# Patient Record
Sex: Female | Born: 1965 | Race: White | Hispanic: No | Marital: Single | Smoking: Former smoker
Health system: Southern US, Community
[De-identification: ages and names within clinical notes are randomized; demographics above are authoritative.]

## PROBLEM LIST (undated history)

## (undated) ENCOUNTER — Emergency Department (HOSPITAL_COMMUNITY): Admission: EM | Payer: Self-pay | Source: Home / Self Care

## (undated) DIAGNOSIS — I10 Essential (primary) hypertension: Secondary | ICD-10-CM

## (undated) DIAGNOSIS — Z9889 Other specified postprocedural states: Secondary | ICD-10-CM

## (undated) DIAGNOSIS — J449 Chronic obstructive pulmonary disease, unspecified: Secondary | ICD-10-CM

## (undated) DIAGNOSIS — R112 Nausea with vomiting, unspecified: Secondary | ICD-10-CM

## (undated) DIAGNOSIS — M1990 Unspecified osteoarthritis, unspecified site: Secondary | ICD-10-CM

## (undated) DIAGNOSIS — K3184 Gastroparesis: Secondary | ICD-10-CM

## (undated) DIAGNOSIS — Z973 Presence of spectacles and contact lenses: Secondary | ICD-10-CM

## (undated) DIAGNOSIS — E7800 Pure hypercholesterolemia, unspecified: Secondary | ICD-10-CM

## (undated) DIAGNOSIS — I739 Peripheral vascular disease, unspecified: Secondary | ICD-10-CM

## (undated) DIAGNOSIS — F32A Depression, unspecified: Secondary | ICD-10-CM

## (undated) DIAGNOSIS — M51369 Other intervertebral disc degeneration, lumbar region without mention of lumbar back pain or lower extremity pain: Secondary | ICD-10-CM

## (undated) DIAGNOSIS — K219 Gastro-esophageal reflux disease without esophagitis: Secondary | ICD-10-CM

## (undated) DIAGNOSIS — E039 Hypothyroidism, unspecified: Secondary | ICD-10-CM

## (undated) DIAGNOSIS — M5136 Other intervertebral disc degeneration, lumbar region: Secondary | ICD-10-CM

## (undated) HISTORY — DX: Pure hypercholesterolemia, unspecified: E78.00

## (undated) HISTORY — PX: CORONARY ARTERY BYPASS GRAFT: SHX141

## (undated) HISTORY — DX: Hypothyroidism, unspecified: E03.9

## (undated) HISTORY — PX: TUBAL LIGATION: SHX77

## (undated) HISTORY — DX: Chronic obstructive pulmonary disease, unspecified: J44.9

## (undated) HISTORY — PX: BACK SURGERY: SHX140

## (undated) HISTORY — DX: Gastro-esophageal reflux disease without esophagitis: K21.9

## (undated) HISTORY — DX: Gastroparesis: K31.84

## (undated) HISTORY — PX: FEMORAL ARTERY STENT: SHX1583

## (undated) HISTORY — PX: CORONARY ANGIOPLASTY WITH STENT PLACEMENT: SHX49

---

## 2002-02-24 ENCOUNTER — Emergency Department (HOSPITAL_COMMUNITY): Admission: EM | Admit: 2002-02-24 | Discharge: 2002-02-24 | Payer: Self-pay | Admitting: Emergency Medicine

## 2002-02-24 ENCOUNTER — Encounter: Payer: Self-pay | Admitting: Emergency Medicine

## 2002-02-27 ENCOUNTER — Ambulatory Visit (HOSPITAL_COMMUNITY): Admission: RE | Admit: 2002-02-27 | Discharge: 2002-02-28 | Payer: Self-pay | Admitting: Vascular Surgery

## 2002-02-27 ENCOUNTER — Encounter: Payer: Self-pay | Admitting: Vascular Surgery

## 2003-05-01 ENCOUNTER — Emergency Department (HOSPITAL_COMMUNITY): Admission: EM | Admit: 2003-05-01 | Discharge: 2003-05-01 | Payer: Self-pay | Admitting: Emergency Medicine

## 2003-05-05 ENCOUNTER — Ambulatory Visit (HOSPITAL_COMMUNITY): Admission: RE | Admit: 2003-05-05 | Discharge: 2003-05-05 | Payer: Self-pay | Admitting: Family Medicine

## 2003-08-15 ENCOUNTER — Emergency Department (HOSPITAL_COMMUNITY): Admission: RE | Admit: 2003-08-15 | Discharge: 2003-08-15 | Payer: Self-pay | Admitting: Emergency Medicine

## 2003-08-25 ENCOUNTER — Ambulatory Visit (HOSPITAL_COMMUNITY): Admission: RE | Admit: 2003-08-25 | Discharge: 2003-08-25 | Payer: Self-pay | Admitting: Family Medicine

## 2003-08-27 ENCOUNTER — Emergency Department (HOSPITAL_COMMUNITY): Admission: EM | Admit: 2003-08-27 | Discharge: 2003-08-27 | Payer: Self-pay | Admitting: Emergency Medicine

## 2004-05-25 ENCOUNTER — Emergency Department (HOSPITAL_COMMUNITY): Admission: EM | Admit: 2004-05-25 | Discharge: 2004-05-26 | Payer: Self-pay | Admitting: Emergency Medicine

## 2004-06-01 ENCOUNTER — Ambulatory Visit (HOSPITAL_COMMUNITY): Admission: RE | Admit: 2004-06-01 | Discharge: 2004-06-01 | Payer: Self-pay | Admitting: Family Medicine

## 2004-07-08 ENCOUNTER — Ambulatory Visit (HOSPITAL_COMMUNITY): Admission: RE | Admit: 2004-07-08 | Discharge: 2004-07-09 | Payer: Self-pay | Admitting: Neurosurgery

## 2005-04-23 ENCOUNTER — Emergency Department (HOSPITAL_COMMUNITY): Admission: EM | Admit: 2005-04-23 | Discharge: 2005-04-23 | Payer: Self-pay | Admitting: Emergency Medicine

## 2006-02-13 ENCOUNTER — Ambulatory Visit (HOSPITAL_COMMUNITY): Admission: RE | Admit: 2006-02-13 | Discharge: 2006-02-13 | Payer: Self-pay | Admitting: Family Medicine

## 2006-07-11 ENCOUNTER — Emergency Department (HOSPITAL_COMMUNITY): Admission: EM | Admit: 2006-07-11 | Discharge: 2006-07-11 | Payer: Self-pay | Admitting: Emergency Medicine

## 2006-07-18 ENCOUNTER — Ambulatory Visit: Payer: Self-pay | Admitting: Vascular Surgery

## 2006-07-24 ENCOUNTER — Ambulatory Visit: Payer: Self-pay | Admitting: Vascular Surgery

## 2006-07-24 ENCOUNTER — Ambulatory Visit (HOSPITAL_COMMUNITY): Admission: RE | Admit: 2006-07-24 | Discharge: 2006-07-24 | Payer: Self-pay | Admitting: Vascular Surgery

## 2006-07-28 ENCOUNTER — Encounter: Payer: Self-pay | Admitting: Vascular Surgery

## 2006-07-28 ENCOUNTER — Inpatient Hospital Stay (HOSPITAL_COMMUNITY): Admission: RE | Admit: 2006-07-28 | Discharge: 2006-08-03 | Payer: Self-pay | Admitting: Vascular Surgery

## 2006-08-09 ENCOUNTER — Ambulatory Visit: Payer: Self-pay | Admitting: Vascular Surgery

## 2006-08-22 ENCOUNTER — Ambulatory Visit: Payer: Self-pay | Admitting: Vascular Surgery

## 2010-04-04 ENCOUNTER — Encounter: Payer: Self-pay | Admitting: Family Medicine

## 2010-04-24 ENCOUNTER — Emergency Department (HOSPITAL_COMMUNITY)
Admission: EM | Admit: 2010-04-24 | Discharge: 2010-04-24 | Disposition: A | Discharge status: Home / Self Care | Payer: PRIVATE HEALTH INSURANCE | Attending: Emergency Medicine | Admitting: Emergency Medicine

## 2010-04-24 DIAGNOSIS — F17200 Nicotine dependence, unspecified, uncomplicated: Secondary | ICD-10-CM | POA: Insufficient documentation

## 2010-04-24 DIAGNOSIS — J069 Acute upper respiratory infection, unspecified: Secondary | ICD-10-CM | POA: Insufficient documentation

## 2010-04-24 DIAGNOSIS — J45909 Unspecified asthma, uncomplicated: Secondary | ICD-10-CM | POA: Insufficient documentation

## 2010-04-24 DIAGNOSIS — J029 Acute pharyngitis, unspecified: Secondary | ICD-10-CM | POA: Insufficient documentation

## 2010-07-27 NOTE — Discharge Summary (Signed)
NAMEPIERRETTE, Alexandria Webster              ACCOUNT NO.:  [id number]   MEDICAL RECORD NO.:  [id number]          PATIENT TYPE:  INP   LOCATION:  2009                         FACILITY:  [facility]   PHYSICIAN:  Alexandria Skye. Hart Webster, M.D.  DATE OF BIRTH:  Aug 10, 1965   DATE OF ADMISSION:  07/28/2006  DATE OF DISCHARGE:  08/03/2006                               DISCHARGE SUMMARY   HISTORY OF PRESENT ILLNESS:  The patient is a 45 year old female with a  history of non-insulin-dependent diabetes, who presented in vascular  surgery consultation with Dr. Hart Webster with a several week history of  increasing pain of the right first toe and inability to walk more than  50 yards because of thigh and buttock discomfort.  She was previously  evaluated by Dr. Hart Webster in 2003, at that time, she was found to have a  severe aortic stenosis in the infrarenal aorta, which was treated with a  stent and she had an excellent result with improvement of the pain in  her fifth toe and resolution of her claudications.  Over the past few  years, these symptoms have recurred and she presented, at this time,  with significant pain and bluish discoloration of her right first toe,  as well as severe claudication symptoms, limiting her walking.  Arterial  Doppler evaluation at [facility] revealed an ABI of 0.78 on the  right and 0.74 on the left, secondary to severe in-flow occlusive  disease and she was scheduled for an angiogram, which was performed by  Dr. Edilia Bo on Jul 24, 2006 and this revealed total occlusion of her  terminal aorta at the site of the previous stent with reconstitution of  the distal common iliacs bilaterally and good runoff.  She was admitted  this hospitalization for aortal bifemoral bypass grafting.   PAST MEDICAL HISTORY:  1. Diabetes mellitus.  2. Hypertension.   PAST SURGICAL HISTORY:  Includes cesarean section and tubal ligation.  Also the aortic stent graft as described above.   MEDICATIONS  PRIOR TO ADMISSION:  Included:  1. Actos plus met 15 mg/850 mg twice daily.  2. Xopenex p.r.n.   FAMILY HISTORY:  Please see the history and physical done at the time of  admission.   SOCIAL HISTORY:  Please see the history and physical done at the time of  admission.   REVIEW OF SYSTEMS:  Please see the history and physical done at the time  admission.   PHYSICAL EXAMINATION:  Please see the history and physical done at the  time of admission.   HOSPITAL COURSE:  Patient was admitted electively on Jul 28, 2006, taken  to the operating room, at which time she underwent the following  procedure:  1. Aortal bifemoral bypass grafting with proximal aortic thrombectomy.  2. Periaortic lymph node biopsy.   Patient tolerated the procedure well and was taken to the postanesthesia  care unit in stable condition.   POSTOPERATIVE HOSPITAL COURSE:  Patient's postoperative course has been  uneventful.  She was extubated without difficulty.  She has maintained  stable hemodynamics.  She did have some early sinus tachycardia,  but  this has improved with time.  Her laboratory values have remained  stable.  Most recent labs, dated Aug 01, 2006, showed the following:  Sodium 136, potassium 3.3, chloride 104, carbon dioxide 27, glucose 74,  BUN 5, creatinine 0.37, albumin 2.0.  White blood cell count 8.3,  hemoglobin 11.6, hematocrit 33.9, platelet count 177,000.  The patient's  diet has slowly been advanced for routine resolution of her ileus.  Activity is increased per standard protocols.  Ankle-brachial index,  done Aug 01, 2006, were 0.94 on the right and 0.93 on the left.  Her  capillary blood glucoses have been under adequate control with sliding  scale and she has been restarted on her oral regimen.  Her oxygen has  been weaned and she maintains good saturations on room air.  Her overall  status is felt to be tentatively stable for discharge in the morning of  Aug 03, 2006, pending  morning round reevaluation.   FINAL DIAGNOSES:  Aortal iliac occlusive disease, status post the above-  described procedure, aortal bifemoral bypass grafting and aortic  thrombectomy.  Note, the periaortic lymph node biopsy done at the time  of procedure, shows the path to be pending at the time of this dictation  to the chart.   OTHER DIAGNOSES:  Include:  1. Non-insulin-dependent diabetes mellitus.  2. Hypertension.  3. Previous cesarean section.  4. Previous tubal ligation.  5. Previous aortic stent graft.   ALLERGIES:  NO KNOWN DRUG ALLERGIES.   MEDICATIONS ON DISCHARGE:  Are as preoperatively:  1. Actos plus met 15/850 one twice daily.  2. Xopenex p.r.n. for pain.  3. She will be given a prescription for Tylox 1 or 2 every 4-6 hours      as needed.   FOLLOWUP:  Will include staple removal at the [facility] office next week and  she will see Dr. Hart Webster in 3 weeks.   CONDITION ON DISCHARGE:  Stable, improving.      Rowe Clack, P.A.-C.      Alexandria Webster, M.D.  Electronically Signed    WEG/MEDQ  D:  08/02/2006  T:  08/02/2006  Job:  [id number]   cc:   Alexandria Skye. Hart Webster, M.D.  Edsel Petrin, D.O.

## 2010-07-27 NOTE — H&P (Signed)
NAME:  Alexandria Webster, Alexandria Webster              ACCOUNT NO.:  [id number]   MEDICAL RECORD NO.:  [id number]          PATIENT TYPE:  AMB   LOCATION:  SDS                          FACILITY:  [facility]   PHYSICIAN:  Quita Skye. Hart Rochester, M.D.  DATE OF BIRTH:  Aug 25, 1965   DATE OF ADMISSION:  07/24/2006  DATE OF DISCHARGE:                              HISTORY & PHYSICAL   CHIEF COMPLAINT:  Severe pain in legs with ambulation and painful right  first toe secondary to aortoiliac occlusive disease.   HISTORY OF PRESENT ILLNESS:  This 45 year old female patient with  noninsulin-dependent diabetes mellitus presents with a several week  history of increasing pain in the right first toe and inability to walk  more than 50 yards because of thigh and buttock discomfort.  She has a  history of a previous evaluation by me in 2003, at which time she was  found to have a severe aortic stenosis in the infrarenal aorta which was  treated with a stent, and she had an excellent result with improvement  of the pain in her fifth toe and resolution of her claudication  symptoms.  Over the past few years, these symptoms have recurred, and  she presents now with significant pain and bluish discoloration of her  right first toe as well as severe claudication symptoms limiting her  walking.  Arterial Doppler evaluation at [facility] revealed  ABI of 0.78 on the right, 0.74 on the left secondary to severe inflow  occlusive disease, and she was scheduled for an angiogram which was  performed by Dr. Edilia Bo on May 12 and revealed total occlusion of her  terminal aorta at the site of the previous stent with reconstitution of  distal common iliacs bilaterally and good runoff.  She was scheduled for  aortobifemoral bypass grafting.   PAST MEDICAL HISTORY:  1. Noninsulin-dependent diabetes mellitus.  2. Hypertension.  3. Negative for coronary artery disease, hyperlipidemia, stroke, COPD,      deep venous thrombosis, or  pulmonary emboli.   PAST SURGICAL HISTORY:  Cesarean section, tubal ligation.   FAMILY HISTORY:  Positive for coronary artery disease in her father and  diabetes mellitus in her father.  Negative for stroke.   SOCIAL HISTORY:  The patient smokes 2 packs of cigarettes per day for  the past 25 years but recently has decreased to about 1/2 pack, she  states.  She does not use alcohol.   REVIEW OF SYSTEMS:  Denies any chest pain, dyspnea on exertion, PND,  orthopnea, anorexia, weight loss, hemoptysis, wheezing, also negative  for any GI complaints such as peptic ulcer disease, dysphagia, or  hematemesis or melena.  Denies urinary symptoms as well.   MEDICATIONS:  Glucovance and aspirin, dose not available.   PHYSICAL EXAMINATION:  VITAL SIGNS:  Blood pressure 148/80, heart rate  74, respirations 16.  GENERAL:  She is a middle aged female in no apparent distress.  She is  alert and oriented x3.  NECK:  Supple, 3+ carotid pulses palpable.  No bruits are audible.  NEUROLOGIC:  Normal.  There is no palpable adenopathy in  the neck.  UPPER EXTREMITIES:  Upper extremity pulses are 3+ bilaterally at the  brachial and radial level.  CHEST:  Clear to auscultation.  CARDIOVASCULAR:  Regular rhythm with no murmurs.  ABDOMEN:  Slightly obese with no palpable masses.  EXTREMITIES:  She has absent femoral, popliteal, and distal pulses  bilaterally.  The right first toe has bluish discoloration and is mildly  tender.  There is no infection, ulceration, or evidence of venous  occlusive disease.   IMPRESSION:  1. 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  2. Noninsulin-dependent diabetes mellitus.  3. Hypertension.  4. History of tobacco abuse.   PLAN:  Admit the patient on May 16 for an elective aortobifemoral bypass  graft to relieve her symptoms.  Risks and benefits have been  thoroughly  discussed with her and her father, and we will proceed on that date.      Quita Skye Hart Rochester, M.D.  Electronically Signed     JDL/MEDQ  D:  07/24/2006  T:  07/24/2006  Job:  [id number]   cc:   Dr. Gaynelle Arabian Rifle

## 2010-07-27 NOTE — Op Note (Signed)
Alexandria Webster, POYNOR              ACCOUNT NO.:  [id number]   MEDICAL RECORD NO.:  [id number]          PATIENT TYPE:  INP   LOCATION:  2899                         FACILITY:  [facility]   PHYSICIAN:  Quita Skye. Hart Rochester, M.D.  DATE OF BIRTH:  05-Aug-1965   DATE OF PROCEDURE:  07/28/2006  DATE OF DISCHARGE:                               OPERATIVE REPORT   PREOPERATIVE DIAGNOSIS:  Severe aortoiliac occlusive disease with severe  claudication in both legs and ischemic right first toe.   POSTOPERATIVE DIAGNOSIS:  Severe aortoiliac occlusive disease with  severe claudication in both legs and ischemic right first toe.   OPERATIONS:  1. Aortobifemoral bypass graft with proximal aortic thrombectomy.  2. Periaortic lymph node biopsy.   SURGEON:  Quita Skye. Hart Rochester, M.D.   FIRST ASSISTANT:  Jerold Coombe, P.A.   ANESTHESIA:  General endotracheal.   PROCEDURE:  The patient was taken to the operating room and placed in  the supine position, at which time satisfactory general endotracheal  anesthesia was administered.  The abdomen and groins were prepped with  Betadine scrub and solution and draped in routine sterile manner after  insertion of a Swan-Ganz catheter, radial arterial line by Anesthesia.  Longitudinal incisions were made in both inguinal areas and carried down  through subcutaneous tissue and the common superficial and profunda  femoris arteries were dissected free and encircled with Vesseloops.  Both vessels were pulseless, but relatively normal in appearance.  A  midline incision was made from the xiphoid to below the umbilicus and  carried down through subcutaneous tissue and linea alba using the Bovie.  Peritoneal cavity was entered and thoroughly explored.  There were a few  adhesions between the greater omentum and the anterior abdominal wall  which were lysed.  The liver, gallbladder, stomach, small bowel and  colon were unremarkable.  The right ovary had a cyst measuring  about 2  cm in diameter which was slightly hemorrhagic in appearance.  There was  no firmness for masses palpable in the right ovary and the left ovary  appeared normal,  The patient will be advised of this finding  postoperatively.  There was no evidence of any peritoneal seeding, tumor  involvement or any evidence of any malignancy in the peritoneal cavity.  There was 1 prominent periaortic lymph node which appeared normal, which  was sent for pathologic examination.  The intestines were reflected to  the right side, retroperitoneum exposed and the aorta was exposed from  the renal arteries to the bifurcation.  It had previously been stented  in the distal aspect and was occluded up to a point about 3 cm proximal  to the renal arteries.  Retroperitoneal tunnels were created posterior  to the ureters and the patient was heparinized.  The patient was also  given 25 g of mannitol.  Aorta was occluded distal to the renal  arteries, transected the distal end oversewn with 2 layers with 3-0  Prolene.  Proximal end had some thrombus extending up to the clamp which  was easily removed with the spatula, but no full-layer endarterectomy  was performed.  This was flushed from proximal and there was no further  thrombus removed.  This was old organized thrombus adherent to the  aortic wall which was removed.  A 14 x 7-mm Hemashield Dacron graft was  then anastomosed end-to-end to the proximal aortic stump using  continuous 3-0 Prolene, buttressing this with a strip of felt.  This was  checked for leaks and none were present.  The limbs were delivered  through the tunnel.  End-to-side anastomoses were done to the common  femoral arteries bilaterally beginning on the right with 5-0 Prolene  followed by the left with no significant bleeding from the anastomotic  sites.  The right leg was opened initially followed by the left leg with  no significant hypotension.  Protamine was then given to reverse  the  heparin.  Following adequate hemostasis, wounds were irrigated with  saline and groins closed in layers with Vicryl in a subcuticular  fashion, linea alba closed with #1 Prolene, the skin with clips and  sterile dressings applied.  The patient was taken to the recovery room  in satisfactory condition.  Estimated blood loss was 250 mL.  She  received no blood from the blood bank for Cell Saver and excellent  urinary output.  She was stable hemodynamically.      Quita Skye Hart Rochester, M.D.  Electronically Signed     JDL/MEDQ  D:  07/28/2006  T:  07/28/2006  Job:  [id number]

## 2010-07-27 NOTE — Assessment & Plan Note (Signed)
OFFICE VISIT   Alexandria Webster, Alexandria Webster  DOB:  12/21/1965                                       08/22/2006  ZOXWR#:[id number]   OFFICE NOTE   The patient is status post aorta bi-femoral bypass grafting for  infrarenal aortic occlusion.  She has done very well with no  claudication symptoms at this time, being able to walk as long a  distance as she would like.  She has a good appetite and her bowel  habits have returned to normal.  She was found to have a small cyst in  her right ovary at the time of surgery and she was advised of this.  The  left ovary appeared normal.  Periaortic lymph node was prominent and was  biopsied, and there was no evidence of any abnormalities on this biopsy.  She nice healing of her abdominal incisions.  No evidence of ventral  hernia, and has excellent femoral pulses bilaterally with well perfused  lower extremities.  She will return in 2 months with ABI and notify us  if she has any problems in the interim.   Quita Skye Hart Rochester, M.D.  Electronically Signed   JDL/MEDQ  D:  08/22/2006  T:  08/22/2006  Job:  [id number]

## 2010-07-27 NOTE — Op Note (Signed)
NAME:  Alexandria Webster, Alexandria Webster              ACCOUNT NO.:  [id number]   MEDICAL RECORD NO.:  [id number]          PATIENT TYPE:  AMB   LOCATION:  SDS                          FACILITY:  [facility]   PHYSICIAN:  Di Kindle. Edilia Bo, M.D.DATE OF BIRTH:  September 16, 1965   DATE OF PROCEDURE:  DATE OF DISCHARGE:                               OPERATIVE REPORT   PREOPERATIVE DIAGNOSIS:  Aortic stenosis.   POSTOPERATIVE DIAGNOSIS:  Aortic occlusion.   PROCEDURE:  1. Aortogram.  2. Bilateral iliac arteriogram.  3. Bilateral lower extremity runoff.   SURGEON:  Di Kindle. Edilia Bo, M.D.   ANESTHESIA:  Local with sedation.   TECHNIQUE:  The patient was taken to the PV Lab at [facility] and sedated with  1 mg of Versed and 25 mcg of Fentanyl.  Both groins were prepped and  draped in the usual sterile fashion.  There were no femoral pulses on  either side.  Using the Doppler, I was able to cannulate the right  common femoral artery after the skin was anesthetized.  A 5 French  sheath was introduced over the wire.  I was then able to get an angled  Glidewire through the previously placed aortic stent up into the  suprarenal aorta.  A pigtail catheter was then positioned at the L1  vertebral body and flush aortogram obtained.  Oblique projections were  obtained of the iliacs.  The lateral projection was obtained of the  aorta and then bilateral lower extremity runoff films were obtained.   FINDINGS:  There are single renal arteries bilaterally with no  significant renal artery stenosis identified.  The aorta is occluded at  the top of the previously placed stent, which had been placed in 2003.  There are two large lumbar collaterals, which maintain patency of the  aorta to this level.  There are extensive collaterals suggesting some  chronic occlusive disease at this level.  There is reconstitution of the  external iliac arteries via the hypogastric arteries bilaterally.  The  common femoral, deep femoral,  superficial femoral and popliteal arteries  are patent bilaterally.  The proximal tibials appear patent although  there is poor visualization distally because of the proximal aortic  occlusion.   CONCLUSIONS:  Aortic occlusion at the level of the previously placed  stent with reconstitution of the external iliac arteries bilaterally via  hypogastric collaterals.      Di Kindle. Edilia Bo, M.D.  Electronically Signed    CSD/MEDQ  D:  07/24/2006  T:  07/24/2006  Job:  [id number]

## 2010-07-30 NOTE — Op Note (Signed)
Alexandria Webster, Alexandria Webster              ACCOUNT NO.:  [id number]   MEDICAL RECORD NO.:  [id number]          PATIENT TYPE:  OIB   LOCATION:  3033                         FACILITY:  [facility]   PHYSICIAN:  Cristi Loron, M.D.DATE OF BIRTH:  January 23, 1966   DATE OF PROCEDURE:  07/08/2004  DATE OF DISCHARGE:                                 OPERATIVE REPORT   PREOPERATIVE DIAGNOSES:  Left L4-5 herniated nucleus pulposus, spinal  stenosis, lumbar radiculopathy, lumbago.   POSTOPERATIVE DIAGNOSES:  Left L4-5 herniated nucleus pulposus, spinal  stenosis, lumbar radiculopathy, lumbago.   PROCEDURE:  Left L4-5 microdiskectomy using microdissection.   SURGEON:  Cristi Loron, M.D.   ASSISTANT:  Danae Orleans. Venetia Maxon, M.D.   ANESTHESIA:  General endotracheal.   ESTIMATED BLOOD LOSS:  50 mL.   SPECIMENS:  None.   DRAINS:  None.   COMPLICATIONS:  None.   BRIEF HISTORY:  The patient is a 45 year old white female who has suffered  from back and left leg pain.  She failed medical management and was worked  up with a lumbar MRI, which demonstrates that she had a herniated disk at L4-  5 on the left.  I discussed the various treatment options with the patient,  including surgery.  The patient was weighed the risks, benefits, and  alternatives of surgery and decided to proceed with a left L4-5  microdiskectomy.   DESCRIPTION OF PROCEDURE:  The patient was brought to the operating room by  the anesthesia team.  General endotracheal anesthesia was induced.  The  patient was turned to the prone position on the Wilson frame.  The  lumbosacral region was then prepared with Betadine scrub and Betadine  solution, sterile drapes were applied.  I then injected the area to be  incised with Marcaine with epinephrine solution and used a scalpel to make a  linear midline incision over the L4-5 interspace.  I used electrocautery to  dissect down through the thoracolumbar fascia and divided the fascia just to  the left of midline, performing a subperiosteal dissection, exposing the  left spinous process and lamina of L4 and L5.  I obtained the intraoperative  radiograph to confirm our location and then inserted the [facility]  retractor for exposure.   We then brought the operating microscope into the field and under its  magnification and illumination, we completed the  microdissection/decompression.  We used the high-speed drill to perform a  left L4 laminotomy.  We then widened the laminotomy with the Kerrison punch  and removed the left L4-5 ligamentum flavum.  We performed a foraminotomy  about the left L5 nerve root.  We then used microdissection to free up the  thecal sac and the nerve root from the epidural tissue.  Then Dr. Venetia Maxon  carefully retracted the neural structures medially with a D'Errico  retractor.  This exposed a free-fragment disk herniation which had migrated  in a cephalad direction and was compressing the exiting left L4 nerve root  just as it was entering within the neural foramen.  We used microdissection  to free up this disk herniation and removed it  in multiple fragments using  the pituitary forceps.  There were more disk fragments out the foramen.  We  teased them out with the nerve hooks and removed them with the pituitary  forceps.  We then palpated along the exit route of the L4 nerve root out at  the neural foramen and noted it was well-decompressed, and then we palpated  along the ventral surface of the thecal sac along the exit route of the left  L5 nerve root, and it was well-decompressed.  We inspected the  intervertebral disk.  There was no large hole in the annulus or any  herniations.  We therefore did not enter into the intervertebral disk space.  We obtained stringent hemostasis using bipolar electrocautery, and we  copiously irrigated the wound out with bacitracin solution.  We removed the  solution and then removed the [facility] retractor.  We then  reapproximated  the patient's thoracolumbar fascia with interrupted #1 Vicryl suture, the  subcutaneous tissue with interrupted 2-0 Vicryl suture and the skin with  Steri-Strips and Benzoin.  The wound was then coated with bacitracin  ointment, sterile dressings applied, the drapes were removed.  The patient  was subsequently returned to a supine position, where she was extubated by  the anesthesia team and transported to the postanesthesia care unit in  stable condition.  All sponge, instrument and needle counts were correct at  the end of this case.      JDJ/MEDQ  D:  07/08/2004  T:  07/09/2004  Job:  [id number]

## 2010-07-30 NOTE — Op Note (Signed)
NAME:  Alexandria Webster, Alexandria Webster                        ACCOUNT NO.:  [id number]   MEDICAL RECORD NO.:  [id number]                   PATIENT TYPE:  OIB   LOCATION:  5735                                 FACILITY:  [facility]   PHYSICIAN:  Quita Skye. Hart Rochester, M.D.               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   PROCEDURES:  1. Abdominal aortogram with bilateral lower extremity run off via right     common femoral approach.  2. PTA of infra-renal aortic stenosis using a 10 x 4 mm ultra thin PTA     catheter at 10 atmospheres for 45 seconds and a P308 stent, and repeat     PTA at 12 atmospheres for 45 seconds with completion angiogram.   SURGEON:  Dr. Hart Rochester.   ANESTHESIA:  Local Xylocaine, Versed 2 mg intravenously, Heparin  administered 4000 units, contrast 190 cc.   DESCRIPTION OF PROCEDURE:  The patient was taken to [facility] and placed in the supine position at which time both groins  were prepped with Betadine solution and draped in routine sterile manner.  After infiltration of 1% Xylocaine, the right common femoral artery was  entered percutaneously.  Guide wire passed into the suprarenal aorta under  fluoroscopic guidance.  A 5 French sheath and dilator were passed over the  guide wire, dilator removed and standard aortogram performed, injecting 20  cc of contrast at 20 cc per second.  This revealed the aorta to be widely  patent proximally with single widely patent renal arteries bilaterally.  Inferior mesenteric artery was patent.  At the level of the inferior  mesenteric artery, there was an irregular atherosclerotic plaque causing  approximate 90% stenosis over about 2 cm, down to a point about 1-2 cm  proximal to the aortic bifurcation.  The aorta distally at the  bifurcation  was widely patent, both common iliac arteries were smooth without evidence  of disease.  There was patency of both internal and external iliac arteries  with some very mild tapering of the proximal right external iliac artery  with no significant stenosis.  Lower extremity run off was performed  bilaterally, injecting 88 cc of contrast at 8 cc per second.  This revealed  the right and left common superficial profunda femoris arteries to be widely  patent, popliteal arteries were patent and normal and there was three vessel  run off through small tibial vessels bilaterally.  On the left, it appeared  that the peroneal artery was occluded in the lower third of the leg, but the  anterior tibial and posterior tibial arteries were patent to the ankle.  The  right leg had patency of all three tibial vessels distally.  Following this,  in  review of the films, it was decided to proceed with PTA and stenting of  this infrarenal stenosis.  Therefore, the 5 [place] sheath was exchanged for  an 8 [place] long sheath which was advanced beyond the lesion.  Using a _____  marker, the level of the stenosis was clearly identified.  4000 units of  Heparin was then given intravenously and a 10 x 4 ultra thin PTA catheter  was advanced to the lesion with a P308 stent mounted appropriately.  The  sheath was withdrawn and initial inflation was at 10 atmospheres for 45  seconds.  Completion angiogram was done through the sheath in the right  iliac artery which revealed a good cosmetic result with some very slight  suggestion of some intraluminal atheroma at the very distal end of the stent  on the patient's left side.  Therefore, the PTA catheter was re-inserted  through the sheath and positioned slightly more distally and a second  inflation at 12 atmospheres for 45 seconds was performed.  After deflation,  completion angiogram revealed some persistence of this lesion which was not  hemodynamically  significant.  Completion angiogram was then performed  through the pigtail catheter, injecting 20 cc of contrast at 20 cc per  second and this revealed the stented segment of the aorta to be widely  patent with a very slight defect as noted which did not appear to be  hemodynamically significant and there was no filling of the inferior  mesenteric artery.  Following correction of the ACT, the sheath was removed,  adequate compression applied, no complications ensued.   FINDINGS:  1. Distal infrarenal aortic stenosis (90%), extending to just above aortic     bifurcation.  2. Normal run off.  3. Successful PTA and stenting of infrarenal aortic stenosis using a 10 x 4     ultra thin PTA catheter and a P308 stent with two inflations, initially     10 atmospheres at 45 seconds and secondarily 12 atmospheres at 45     seconds.                                               Quita Skye Hart Rochester, M.D.    JDL/MEDQ  D:  02/27/2002  T:  02/27/2002  Job:  [id number]

## 2011-02-11 ENCOUNTER — Emergency Department (HOSPITAL_COMMUNITY): Payer: 59

## 2011-02-11 ENCOUNTER — Other Ambulatory Visit: Payer: Self-pay

## 2011-02-11 ENCOUNTER — Encounter: Payer: Self-pay | Admitting: *Deleted

## 2011-02-11 ENCOUNTER — Emergency Department (HOSPITAL_COMMUNITY)
Admission: EM | Admit: 2011-02-11 | Discharge: 2011-02-12 | Disposition: A | Discharge status: Home / Self Care | Payer: 59 | Attending: Emergency Medicine | Admitting: Emergency Medicine

## 2011-02-11 DIAGNOSIS — J45909 Unspecified asthma, uncomplicated: Secondary | ICD-10-CM | POA: Insufficient documentation

## 2011-02-11 DIAGNOSIS — J45901 Unspecified asthma with (acute) exacerbation: Secondary | ICD-10-CM

## 2011-02-11 DIAGNOSIS — E119 Type 2 diabetes mellitus without complications: Secondary | ICD-10-CM | POA: Insufficient documentation

## 2011-02-11 DIAGNOSIS — F17200 Nicotine dependence, unspecified, uncomplicated: Secondary | ICD-10-CM | POA: Insufficient documentation

## 2011-02-11 LAB — BASIC METABOLIC PANEL
BUN: 9 mg/dL (ref 6–23)
Calcium: 10 mg/dL (ref 8.4–10.5)
Chloride: 102 mEq/L (ref 96–112)
GFR calc Af Amer: >90 mL/min (ref >90)
GFR calc non Af Amer: >90 mL/min (ref >90)
Potassium: 3.7 mEq/L (ref 3.5–5.1)

## 2011-02-11 LAB — CARDIAC PANEL(CRET KIN+CKTOT+MB+TROPI)
CK, MB: 3.4 ng/mL (ref 0.3–4.0)
Relative Index: INVALID (ref 0.0–2.5)
Troponin I: <0.3 ng/mL (ref <0.30)

## 2011-02-11 MED ORDER — HYDROCODONE-ACETAMINOPHEN 5-325 MG PO TABS
1.0000 | ORAL_TABLET | Freq: Once | ORAL | Status: AC
  Administered 2011-02-11: 1 via ORAL
  Filled 2011-02-11: qty 1

## 2011-02-11 MED ORDER — IOHEXOL 350 MG/ML SOLN
100.0000 mL | Freq: Once | INTRAVENOUS | Status: AC | PRN
  Administered 2011-02-11: 100 mL via INTRAVENOUS

## 2011-02-11 MED ORDER — IPRATROPIUM BROMIDE 0.02 % IN SOLN
0.5000 mg | Freq: Once | RESPIRATORY_TRACT | Status: AC
  Administered 2011-02-11: 0.5 mg via RESPIRATORY_TRACT
  Filled 2011-02-11: qty 2.5

## 2011-02-11 MED ORDER — ALBUTEROL SULFATE (5 MG/ML) 0.5% IN NEBU
5.0000 mg | INHALATION_SOLUTION | Freq: Once | RESPIRATORY_TRACT | Status: AC
  Administered 2011-02-11: 5 mg via RESPIRATORY_TRACT
  Filled 2011-02-11: qty 1

## 2011-02-11 NOTE — ED Notes (Signed)
Pt c/o sob and right sided chest pain that started yesterday; pt states she has a hx of asthma

## 2011-02-11 NOTE — ED Notes (Signed)
Pt insisting on leaving.  Discussed with pt that some lab work was abnormal, and additional testing ordered.  Discussed with pt risks of leaving AMA, including risk of death.  Pt still stating she needs to leave since she doesn't have a babysitter.

## 2011-02-11 NOTE — ED Notes (Addendum)
Pt c/o right sided chest pain that is worse with coughing and deep breathing. Pt states that her chest feels tight and she can't get her phlegm up. Pt states that her inhaler is not working. Alert and oriented x 3. Skin warm and dry. Color pink. Breath sounds clear and equal bilaterally but diminished. CCM showing NSR.

## 2011-02-11 NOTE — ED Notes (Signed)
Pt reporting ease of breathing following respiratory treatment.  Ambulated to BR with no difficulty.

## 2011-02-11 NOTE — ED Notes (Addendum)
Burgess Amor, PAC at bedside.  Pt did agree to stay to have additional testing.

## 2011-02-12 MED ORDER — PREDNISONE 20 MG PO TABS
60.0000 mg | ORAL_TABLET | Freq: Once | ORAL | Status: AC
  Administered 2011-02-12: 60 mg via ORAL
  Filled 2011-02-12: qty 3

## 2011-02-12 MED ORDER — PREDNISONE 20 MG PO TABS: 60.0000 mg | ORAL_TABLET | Freq: Every day | ORAL | Status: AC

## 2011-02-14 NOTE — ED Provider Notes (Signed)
History     CSN: [id number] Arrival date & time: 02/11/2011  5:15 PM   First MD Initiated Contact with Patient 02/11/11 1802      Chief Complaint  Patient presents with  . Shortness of Breath  . Chest Pain    (Consider location/radiation/quality/duration/timing/severity/associated sxs/prior treatment) Patient is a 45 y.o. female presenting with shortness of breath and chest pain. The history is provided by the patient.  Shortness of Breath  The current episode started yesterday. The problem has been unchanged. The problem is moderate. The symptoms are relieved by nothing. Associated symptoms include chest pain, a fever, cough, shortness of breath and wheezing. Pertinent negatives include no sore throat. The cough is non-productive. Nothing relieves the cough. The cough is worsened by activity. Her past medical history is significant for asthma and past wheezing.  Chest Pain Primary symptoms include a fever, shortness of breath, cough and wheezing. Pertinent negatives for primary symptoms include no abdominal pain, no nausea and no dizziness.  The patient's medical history is significant for asthma.  The patient's medical history is significant for asthma.  Pertinent negatives for associated symptoms include no numbness and no weakness.     Past Medical History  Diagnosis Date  . Asthma   . Diabetes mellitus     Past Surgical History  Procedure Date  . Femoral artery stent right leg    History reviewed. No pertinent family history.  History  Substance Use Topics  . Smoking status: Current Everyday Smoker -- 0.5 packs/day    Types: Cigarettes  . Smokeless tobacco: Not on file  . Alcohol Use: No    OB History    Grav Para Term Preterm Abortions TAB SAB Ect Mult Living                  Review of Systems  Constitutional: Positive for fever.  HENT: Negative for congestion, sore throat and neck pain.   Eyes: Negative.   Respiratory: Positive for cough, chest  tightness, shortness of breath and wheezing.   Cardiovascular: Positive for chest pain.  Gastrointestinal: Negative for nausea and abdominal pain.  Genitourinary: Negative.   Musculoskeletal: Negative for joint swelling and arthralgias.  Skin: Negative.  Negative for rash and wound.  Neurological: Negative for dizziness, weakness, light-headedness, numbness and headaches.  Hematological: Negative.   Psychiatric/Behavioral: Negative.     Allergies  Review of patient's allergies indicates no known allergies.  Home Medications   Current Outpatient Rx  Name Route Sig Dispense Refill  . ALBUTEROL SULFATE HFA 108 (90 BASE) MCG/ACT IN AERS Inhalation Inhale 2 puffs into the lungs 4 (four) times daily.      Marland Kitchen PREDNISONE 20 MG PO TABS Oral Take 3 tablets (60 mg total) by mouth daily. 12 tablet 0    BP 159/78  Pulse 106  Temp(Src) 98.4 F (36.9 C) (Oral)  Resp 18  Ht 4\' 5"  (1.346 m)  Wt 165 lb (74.844 kg)  BMI 41.30 kg/m2  SpO2 98%  LMP 01/28/2011  Physical Exam  Nursing note and vitals reviewed. Constitutional: She is oriented to person, place, and time. She appears well-developed and well-nourished.  HENT:  Head: Normocephalic and atraumatic.  Eyes: Conjunctivae are normal.  Neck: Normal range of motion.  Cardiovascular: Normal rate, regular rhythm, normal heart sounds and intact distal pulses.   Pulmonary/Chest: Effort normal. No respiratory distress. She has wheezes. She exhibits no tenderness.       Frequent cough.   Abdominal: Soft. Bowel sounds are normal.  There is no tenderness.  Musculoskeletal: Normal range of motion. She exhibits no edema and no tenderness.  Neurological: She is alert and oriented to person, place, and time.  Skin: Skin is warm and dry. No erythema.  Psychiatric: She has a normal mood and affect.    ED Course  Procedures (including critical care time)  Labs Reviewed  D-DIMER, QUANTITATIVE - Abnormal; Notable for the following:    D-Dimer,  Quant 0.98 (*)    All other components within normal limits  BASIC METABOLIC PANEL - Abnormal; Notable for the following:    Glucose, Bld 390 (*)    Creatinine, Ser 0.47 (*)    All other components within normal limits  CARDIAC PANEL(CRET KIN+CKTOT+MB+TROPI)  LAB REPORT - SCANNED   No results found.   1. Asthma attack       MDM  Patient had moderate improvement in wheezing with albuterol 5mg /atrovent 0.5 mg neb given, still with chest pressure which is worse with deep inspiration.  D dimer elevated,  Ct angio neg.  Patient stable at dc.  Started on prednisone pulse dose.  She has albuterol at home.        Candis Musa, PA 02/14/11 1539

## 2011-02-16 NOTE — ED Provider Notes (Signed)
Medical screening examination/treatment/procedure(s) were conducted as a shared visit with non-physician practitioner(s) and myself.  I personally evaluated the patient during the encounter.  History and physical most consistent with an asthma attack.  CT angio chest negative  Donnetta Hutching, MD 02/16/11 [phone]

## 2011-06-16 ENCOUNTER — Emergency Department (HOSPITAL_COMMUNITY)
Admission: EM | Admit: 2011-06-16 | Discharge: 2011-06-16 | Disposition: A | Discharge status: Home / Self Care | Payer: 59 | Attending: Emergency Medicine | Admitting: Emergency Medicine

## 2011-06-16 ENCOUNTER — Emergency Department (HOSPITAL_COMMUNITY): Payer: 59

## 2011-06-16 ENCOUNTER — Encounter (HOSPITAL_COMMUNITY): Payer: Self-pay

## 2011-06-16 DIAGNOSIS — R1013 Epigastric pain: Secondary | ICD-10-CM | POA: Insufficient documentation

## 2011-06-16 DIAGNOSIS — F17200 Nicotine dependence, unspecified, uncomplicated: Secondary | ICD-10-CM | POA: Insufficient documentation

## 2011-06-16 DIAGNOSIS — R112 Nausea with vomiting, unspecified: Secondary | ICD-10-CM | POA: Insufficient documentation

## 2011-06-16 DIAGNOSIS — R197 Diarrhea, unspecified: Secondary | ICD-10-CM | POA: Insufficient documentation

## 2011-06-16 DIAGNOSIS — K529 Noninfective gastroenteritis and colitis, unspecified: Secondary | ICD-10-CM

## 2011-06-16 DIAGNOSIS — I10 Essential (primary) hypertension: Secondary | ICD-10-CM | POA: Insufficient documentation

## 2011-06-16 DIAGNOSIS — R51 Headache: Secondary | ICD-10-CM | POA: Insufficient documentation

## 2011-06-16 DIAGNOSIS — J3489 Other specified disorders of nose and nasal sinuses: Secondary | ICD-10-CM | POA: Insufficient documentation

## 2011-06-16 DIAGNOSIS — R1011 Right upper quadrant pain: Secondary | ICD-10-CM | POA: Insufficient documentation

## 2011-06-16 DIAGNOSIS — E119 Type 2 diabetes mellitus without complications: Secondary | ICD-10-CM | POA: Insufficient documentation

## 2011-06-16 DIAGNOSIS — J45909 Unspecified asthma, uncomplicated: Secondary | ICD-10-CM | POA: Insufficient documentation

## 2011-06-16 DIAGNOSIS — R05 Cough: Secondary | ICD-10-CM | POA: Insufficient documentation

## 2011-06-16 DIAGNOSIS — K5289 Other specified noninfective gastroenteritis and colitis: Secondary | ICD-10-CM | POA: Insufficient documentation

## 2011-06-16 DIAGNOSIS — R059 Cough, unspecified: Secondary | ICD-10-CM | POA: Insufficient documentation

## 2011-06-16 HISTORY — DX: Essential (primary) hypertension: I10

## 2011-06-16 LAB — COMPREHENSIVE METABOLIC PANEL
ALT: 14 U/L (ref 0–35)
Albumin: 4.1 g/dL (ref 3.5–5.2)
Alkaline Phosphatase: 57 U/L (ref 39–117)
BUN: 16 mg/dL (ref 6–23)
CO2: 22 mEq/L (ref 19–32)
Chloride: 100 mEq/L (ref 96–112)
GFR calc non Af Amer: >90 mL/min (ref >90)
Glucose, Bld: 267 mg/dL — ABNORMAL HIGH (ref 70–99)
Sodium: 138 mEq/L (ref 135–145)
Total Bilirubin: 0.6 mg/dL (ref 0.3–1.2)
Total Protein: 7.8 g/dL (ref 6.0–8.3)

## 2011-06-16 LAB — CBC
Hemoglobin: 16 g/dL — ABNORMAL HIGH (ref 12.0–15.0)
Platelets: 133 10*3/uL — ABNORMAL LOW (ref 150–400)
RBC: 4.83 MIL/uL (ref 3.87–5.11)
WBC: 7.5 10*3/uL (ref 4.0–10.5)

## 2011-06-16 LAB — LIPASE, BLOOD: Lipase: 33 U/L (ref 11–59)

## 2011-06-16 LAB — DIFFERENTIAL
Basophils Relative: 0 % (ref 0–1)
Eosinophils Absolute: 0.1 10*3/uL (ref 0.0–0.7)
Monocytes Absolute: 0.4 10*3/uL (ref 0.1–1.0)
Monocytes Relative: 5 % (ref 3–12)
Neutrophils Relative %: 75 % (ref 43–77)

## 2011-06-16 MED ORDER — SODIUM CHLORIDE 0.9 % IV SOLN
Freq: Once | INTRAVENOUS | Status: AC
  Administered 2011-06-16: 08:00:00 via INTRAVENOUS

## 2011-06-16 MED ORDER — PANTOPRAZOLE SODIUM 40 MG IV SOLR
40.0000 mg | Freq: Once | INTRAVENOUS | Status: AC
  Administered 2011-06-16: 40 mg via INTRAVENOUS
  Filled 2011-06-16: qty 40

## 2011-06-16 MED ORDER — ONDANSETRON HCL 4 MG/2ML IJ SOLN
4.0000 mg | Freq: Once | INTRAMUSCULAR | Status: AC
  Administered 2011-06-16: 4 mg via INTRAVENOUS
  Filled 2011-06-16: qty 2

## 2011-06-16 MED ORDER — PROMETHAZINE HCL 25 MG PO TABS: 25.0000 mg | ORAL_TABLET | Freq: Four times a day (QID) | ORAL | Status: DC | PRN

## 2011-06-16 MED ORDER — HYDROCODONE-ACETAMINOPHEN 5-325 MG PO TABS: 10.0000 | ORAL_TABLET | Freq: Four times a day (QID) | ORAL | Status: AC | PRN

## 2011-06-16 MED ORDER — PANTOPRAZOLE SODIUM 20 MG PO TBEC: 20.0000 mg | DELAYED_RELEASE_TABLET | Freq: Every day | ORAL | Status: DC

## 2011-06-16 NOTE — ED Notes (Signed)
[facility] called for clarification of Norco Rx was written 10 tab every 6 hours prn # 10. Clarified w/ [facility]  Norco Rx should read 1 tab every 6 hr prn # 10

## 2011-06-16 NOTE — ED Notes (Signed)
Pt reports n/v, diarrhea, abd pain since last Thursday.  Pt denies any GU symptoms.

## 2011-06-16 NOTE — Discharge Instructions (Signed)
Follow up with your md if not improving. °

## 2011-06-16 NOTE — ED Provider Notes (Signed)
History  This chart was scribed for Alexandria Lennert, MD by Bennett Scrape. This patient was seen in room APA04/APA04 and the patient's care was started at 7:43AM.  CSN: [id number]  Arrival date & time 06/16/11  4098   First MD Initiated Contact with Patient 06/16/11 [phone]      Chief Complaint  Patient presents with  . Abdominal Pain  . Nausea  . Emesis    Patient is a 46 y.o. female presenting with abdominal pain. The history is provided by the patient. No language interpreter was used.  Abdominal Pain The primary symptoms of the illness include abdominal pain, nausea, vomiting and diarrhea. The primary symptoms of the illness do not include fever, fatigue, shortness of breath or dysuria. The current episode started more than 2 days ago. The onset of the illness was gradual. The problem has been gradually worsening.  The abdominal pain began more than 2 days ago. The pain came on gradually. The abdominal pain has been gradually worsening since its onset. The abdominal pain is located in the RUQ and epigastric region. The abdominal pain does not radiate. The abdominal pain is relieved by nothing. The abdominal pain is exacerbated by vomiting.  Symptoms associated with the illness do not include hematuria, frequency or back pain. Significant associated medical issues include diabetes. Significant associated medical issues do not include inflammatory bowel disease, gallstones or liver disease.    Alexandria Webster is a 46 y.o. female who presents to the Emergency Department complaining of one week of gradual onset, gradually worsening, constant abdominal pain described as soreness. The pain is located in the RUQ and epigastric region and non-radiating. The pain is worse with emesis. Pt lists nausea, several episodes of non-bloody emesis and diarrhea as associated symptoms. Pt states that the diarrhea has since resolved. She reports taking antinausea medication that was prescribed to her grandson  but states that it didn't help improve her symptoms. She reports that her last episode of emesis was around 12:30AM this morning. She also c/o cold like symptoms including productive cough of clear sputum, congestion and HA. She reports taking tylenol for the cold symptoms with mild improvement. She denies fever, SOB, chest pain, and urinary symptoms as associated symptoms. She has a h/o diabetes and asthma. She is a current everyday smoker but denies alcohol use.  Pt's PCP is Dr. Genia Hotter.  Past Medical History  Diagnosis Date  . Asthma   . Diabetes mellitus   . Hypertension     Past Surgical History  Procedure Date  . Femoral artery stent right leg    No family history on file.  History  Substance Use Topics  . Smoking status: Current Everyday Smoker -- 0.5 packs/day    Types: Cigarettes  . Smokeless tobacco: Not on file  . Alcohol Use: No    Review of Systems  Constitutional: Negative for fever and fatigue.  HENT: Positive for congestion. Negative for sinus pressure and ear discharge.   Eyes: Negative for discharge.  Respiratory: Positive for cough. Negative for shortness of breath.   Cardiovascular: Negative for chest pain.  Gastrointestinal: Positive for nausea, vomiting, abdominal pain and diarrhea.  Genitourinary: Negative for dysuria, frequency and hematuria.  Musculoskeletal: Negative for back pain.  Skin: Negative for rash.  Neurological: Positive for headaches. Negative for seizures.  Hematological: Negative.   Psychiatric/Behavioral: Negative for hallucinations.    Allergies  Review of patient's allergies indicates no known allergies.  Home Medications   Current Outpatient Rx  Name  Route Sig Dispense Refill  . ALBUTEROL SULFATE HFA 108 (90 BASE) MCG/ACT IN AERS Inhalation Inhale 2 puffs into the lungs 4 (four) times daily.        Triage Vitals: BP 131/95  Pulse 109  Temp(Src) 97.9 F (36.6 C) (Oral)  Resp 18  Ht 4\' 5"  (1.346 m)  Wt 164 lb (74.39  kg)  BMI 41.05 kg/m2  SpO2 99%  Physical Exam  Nursing note and vitals reviewed. Constitutional: She is oriented to person, place, and time. She appears well-developed and well-nourished.  HENT:  Head: Normocephalic and atraumatic.  Eyes: Conjunctivae and EOM are normal. No scleral icterus.  Neck: Neck supple. No thyromegaly present.  Cardiovascular: Normal rate and regular rhythm.  Exam reveals no gallop and no friction rub.   No murmur heard. Pulmonary/Chest: Effort normal. No stridor. She has no wheezes. She has no rales. She exhibits no tenderness.  Abdominal: She exhibits no distension. There is tenderness (moderately tender in RUQ and epigastric region). There is no rebound.  Musculoskeletal: Normal range of motion. She exhibits no edema.  Lymphadenopathy:    She has no cervical adenopathy.  Neurological: She is alert and oriented to person, place, and time. Coordination normal.  Skin: Skin is warm and dry. No rash noted. No erythema.  Psychiatric: She has a normal mood and affect. Her behavior is normal.    ED Course  Procedures (including critical care time)  DIAGNOSTIC STUDIES: Oxygen Saturation is 99% on room air, normal by my interpretation.    COORDINATION OF CARE: 7:47AM-Discussed treatment plan, chest and abdomen x-ray and ultrasound with pt and pt agreed to plan. 9:04AM-Pt rechecked and feels better. Discussed labs and x-rays with pt and pt acknowledged results.   Labs Reviewed  CBC - Abnormal; Notable for the following:    Hemoglobin 16.0 (*)    HCT 46.3 (*)    Platelets 133 (*)    All other components within normal limits  COMPREHENSIVE METABOLIC PANEL - Abnormal; Notable for the following:    Glucose, Bld 267 (*)    All other components within normal limits  DIFFERENTIAL  LIPASE, BLOOD   Dg Abd Acute W/chest  06/16/2011  *RADIOLOGY REPORT*  Clinical Data: Abdominal pain and vomiting, history of tubal ligation  ACUTE ABDOMEN SERIES (ABDOMEN 2 VIEW & CHEST  1 VIEW)  Comparison: Chest radiograph - 02/11/2011; lumbar spineradiograph - 08/27/2003  Findings: Grossly unchanged cardiac silhouette and mediastinal contours given improved inspiratory effort.  There is mild eventration of the medial aspect of right hemidiaphragm.  There is unchanged mild diffuse interstitial thickening without focal airspace opacity.  No pleural effusion or pneumothorax.  Moderate colonic stool burden without evidence of obstruction.  No pneumoperitoneum, pneumatosis or portal venous gas. No definite abnormal intra-abdominal calcifications.  A stent overlies the lower lumbar spine.  Degenerate change of the thoracolumbar spine.  No acute osseous abnormalities.  Surgical clips overlie the bilateral inguinal crease.  IMPRESSION: 1.  Chronic mild diffuse interstitial thickening which may be the sequela of airways disease/bronchitis. No focal airspace opacities to suggest pneumonia.  2.  Moderate colonic stool burden without evidence of obstruction.  Original Report Authenticated By: Waynard Reeds, M.D.     No diagnosis found.    MDM        The chart was scribed for me under my direct supervision.  I personally performed the history, physical, and medical decision making and all procedures in the evaluation of this patient.Jomarie Longs  Purnell Shoemaker, MD 06/16/11 [phone]

## 2011-09-10 ENCOUNTER — Emergency Department (HOSPITAL_COMMUNITY)
Admission: EM | Admit: 2011-09-10 | Discharge: 2011-09-10 | Disposition: A | Discharge status: Home / Self Care | Payer: Self-pay | Attending: Emergency Medicine | Admitting: Emergency Medicine

## 2011-09-10 ENCOUNTER — Encounter (HOSPITAL_COMMUNITY): Payer: Self-pay | Admitting: Emergency Medicine

## 2011-09-10 DIAGNOSIS — F17200 Nicotine dependence, unspecified, uncomplicated: Secondary | ICD-10-CM | POA: Insufficient documentation

## 2011-09-10 DIAGNOSIS — L02212 Cutaneous abscess of back [any part, except buttock]: Secondary | ICD-10-CM

## 2011-09-10 DIAGNOSIS — I10 Essential (primary) hypertension: Secondary | ICD-10-CM | POA: Insufficient documentation

## 2011-09-10 DIAGNOSIS — E119 Type 2 diabetes mellitus without complications: Secondary | ICD-10-CM | POA: Insufficient documentation

## 2011-09-10 DIAGNOSIS — L02219 Cutaneous abscess of trunk, unspecified: Secondary | ICD-10-CM | POA: Insufficient documentation

## 2011-09-10 MED ORDER — ONDANSETRON HCL 4 MG PO TABS
4.0000 mg | ORAL_TABLET | Freq: Once | ORAL | Status: AC
  Administered 2011-09-10: 4 mg via ORAL
  Filled 2011-09-10: qty 1

## 2011-09-10 MED ORDER — CEFTRIAXONE SODIUM 1 G IJ SOLR
1.0000 g | Freq: Once | INTRAMUSCULAR | Status: AC
  Administered 2011-09-10: 1 g via INTRAMUSCULAR
  Filled 2011-09-10: qty 10

## 2011-09-10 MED ORDER — LIDOCAINE HCL (PF) 1 % IJ SOLN
INTRAMUSCULAR | Status: AC
  Administered 2011-09-10: 2.1 mL
  Filled 2011-09-10: qty 5

## 2011-09-10 MED ORDER — DOXYCYCLINE HYCLATE 100 MG PO TABS
100.0000 mg | ORAL_TABLET | Freq: Once | ORAL | Status: AC
  Administered 2011-09-10: 100 mg via ORAL
  Filled 2011-09-10: qty 1

## 2011-09-10 MED ORDER — DOXYCYCLINE HYCLATE 100 MG PO CAPS: 100.0000 mg | ORAL_CAPSULE | Freq: Two times a day (BID) | ORAL | Status: AC

## 2011-09-10 MED ORDER — OXYCODONE-ACETAMINOPHEN 5-325 MG PO TABS: 1.0000 | ORAL_TABLET | Freq: Four times a day (QID) | ORAL | Status: AC | PRN

## 2011-09-10 MED ORDER — CEPHALEXIN 500 MG PO CAPS: 500.0000 mg | ORAL_CAPSULE | Freq: Four times a day (QID) | ORAL | Status: AC

## 2011-09-10 NOTE — ED Notes (Signed)
Patient removed tick from mid-back 4 days ago. C/o swelling with itching and burning to area.

## 2011-09-10 NOTE — ED Provider Notes (Signed)
Medical screening examination/treatment/procedure(s) were performed by non-physician practitioner and as supervising physician I was immediately available for consultation/collaboration. Devoria Albe, MD, Armando Gang   Ward Givens, MD 09/10/11 [phone]

## 2011-09-10 NOTE — ED Provider Notes (Signed)
History     CSN: [id number]  Arrival date & time 09/10/11  1246   First MD Initiated Contact with Patient 09/10/11 1330      Chief Complaint  Patient presents with  . Insect Bite    tick bite    (Consider location/radiation/quality/duration/timing/severity/associated sxs/prior treatment) HPI Comments: Patient states she removed a tick from the mid back about 4 days ago. She now has swelling of the area along with a burning and itching type sensation. The patient denies fever. She's not had any unusual rash. She's not had nausea or vomiting. There is some mild to moderate drainage from the site of the tick bite. It is also of note that the patient is a diabetic. She has tried conservative management measures but these have been unsuccessful.  The history is provided by the patient.    Past Medical History  Diagnosis Date  . Asthma   . Diabetes mellitus   . Hypertension     Past Surgical History  Procedure Date  . Femoral artery stent right leg    Family History  Problem Relation Age of Onset  . Stroke Mother   . Hypertension Mother   . Heart failure Father     History  Substance Use Topics  . Smoking status: Current Everyday Smoker -- 0.5 packs/day for 30 years    Types: Cigarettes  . Smokeless tobacco: Never Used  . Alcohol Use: No    OB History    Grav Para Term Preterm Abortions TAB SAB Ect Mult Living   2 1  1 1  1   1       Review of Systems  Constitutional: Negative for activity change.       All ROS Neg except as noted in HPI  HENT: Negative for nosebleeds and neck pain.   Eyes: Negative for photophobia and discharge.  Respiratory: Positive for wheezing. Negative for cough and shortness of breath.   Cardiovascular: Negative for chest pain and palpitations.  Gastrointestinal: Negative for abdominal pain and blood in stool.  Genitourinary: Negative for dysuria, frequency and hematuria.  Musculoskeletal: Negative for back pain and arthralgias.  Skin:  Negative.   Neurological: Negative for dizziness, seizures and speech difficulty.  Psychiatric/Behavioral: Negative for hallucinations and confusion.    Allergies  Review of patient's allergies indicates no known allergies.  Home Medications   Current Outpatient Rx  Name Route Sig Dispense Refill  . ALBUTEROL SULFATE HFA 108 (90 BASE) MCG/ACT IN AERS Inhalation Inhale 2 puffs into the lungs 4 (four) times daily.      . ASPIRIN 81 MG PO TABS Oral Take 81 mg by mouth daily.    Marland Kitchen LISINOPRIL 20 MG PO TABS Oral Take 20 mg by mouth daily.    Marland Kitchen SITAGLIPTIN-METFORMIN HCL 50-1000 MG PO TABS Oral Take 1 tablet by mouth 2 (two) times daily with a meal.    . CEPHALEXIN 500 MG PO CAPS Oral Take 1 capsule (500 mg total) by mouth 4 (four) times daily. 20 capsule 0  . DOXYCYCLINE HYCLATE 100 MG PO CAPS Oral Take 1 capsule (100 mg total) by mouth 2 (two) times daily. 20 capsule 0  . OXYCODONE-ACETAMINOPHEN 5-325 MG PO TABS Oral Take 1 tablet by mouth every 6 (six) hours as needed for pain. 20 tablet 0  . PROMETHAZINE HCL 25 MG PO TABS Oral Take 1 tablet (25 mg total) by mouth every 6 (six) hours as needed for nausea. 15 tablet 0    BP 136/96  Pulse 99  Temp 98.7 F (37.1 C) (Oral)  Resp 18  Ht 5\' 4"  (1.626 m)  Wt 169 lb (76.658 kg)  BMI 29.01 kg/m2  LMP 03/30/2011  Physical Exam  Nursing note and vitals reviewed. Constitutional: She is oriented to person, place, and time. She appears well-developed and well-nourished.  Non-toxic appearance.  HENT:  Head: Normocephalic.  Right Ear: Tympanic membrane and external ear normal.  Left Ear: Tympanic membrane and external ear normal.  Eyes: EOM and lids are normal. Pupils are equal, round, and reactive to light.  Neck: Normal range of motion. Neck supple. Carotid bruit is not present.  Cardiovascular: Normal rate, regular rhythm, normal heart sounds, intact distal pulses and normal pulses.   Pulmonary/Chest: Breath sounds normal. No respiratory  distress.  Abdominal: Soft. Bowel sounds are normal. There is no tenderness. There is no guarding.  Musculoskeletal: Normal range of motion.       At left lower back area, there is an abscess area with an ulcer present. Mild to moderate drainage present. No red streaking appreciated. There is one small satellite pustules near the ulcer area.  Lymphadenopathy:       Head (right side): No submandibular adenopathy present.       Head (left side): No submandibular adenopathy present.    She has no cervical adenopathy.  Neurological: She is alert and oriented to person, place, and time. She has normal strength. No cranial nerve deficit or sensory deficit.  Skin: Skin is warm and dry.  Psychiatric: She has a normal mood and affect. Her speech is normal.    ED Course  Procedures (including critical care time)  Labs Reviewed - No data to display No results found.   1. Abscess of back       MDM  I have reviewed nursing notes, vital signs, and all appropriate lab and imaging results for this patient. Patient has an abscess of the left lower back area. At this time the abscess is not a candidate for draining. At this time there is no temperature elevation or tachycardia present. There is mild to moderate drainage present already. The patient is treated in the emergency department with Rocephin and doxycycline. Prescriptions given for doxycycline and Keflex. The patient is to be seen by her doctor or in the emergency room in 3-4 days for recheck. Patient is also given a prescription for Percocet #20 to use for pain and discomfort. Patient invited to return to the emergency department sooner if any changes, problems, or concerns.       Kathie Dike, [place] 09/10/11 1401

## 2011-09-10 NOTE — Discharge Instructions (Signed)
Please soak back in a tub of warm water for 15-20 minutes daily until abscess resolves. Keflex 4 times daily with food, doxycycline 2 times daily with food until all taken. May use Tylenol for mild pain, use Percocet for more severe pain. This medication may cause drowsiness and/or constipation. Please use with caution. Please see your primary physician in 3-4 days for recheck, or return to the emergency department for recheck. Abscess An abscess (boil or furuncle) is an infected area that contains a collection of pus.  SYMPTOMS Signs and symptoms of an abscess include pain, tenderness, redness, or hardness. You may feel a moveable soft area under your skin. An abscess can occur anywhere in the body.  TREATMENT  A surgical cut (incision) may be made over your abscess to drain the pus. Gauze may be packed into the space or a drain may be looped through the abscess cavity (pocket). This provides a drain that will allow the cavity to heal from the inside outwards. The abscess may be painful for a few days, but should feel much better if it was drained.  Your abscess, if seen early, may not have localized and may not have been drained. If not, another appointment may be required if it does not get better on its own or with medications. HOME CARE INSTRUCTIONS   Only take over-the-counter or prescription medicines for pain, discomfort, or fever as directed by your caregiver.   Take your antibiotics as directed if they were prescribed. Finish them even if you start to feel better.   Keep the skin and clothes clean around your abscess.   If the abscess was drained, you will need to use gauze dressing to collect any draining pus. Dressings will typically need to be changed 3 or more times a day.   The infection may spread by skin contact with others. Avoid skin contact as much as possible.   Practice good hygiene. This includes regular hand washing, cover any draining skin lesions, and do not share  personal care items.   If you participate in sports, do not share athletic equipment, towels, whirlpools, or personal care items. Shower after every practice or tournament.   If a draining area cannot be adequately covered:   Do not participate in sports.   Children should not participate in day care until the wound has healed or drainage stops.   If your caregiver has given you a follow-up appointment, it is very important to keep that appointment. Not keeping the appointment could result in a much worse infection, chronic or permanent injury, pain, and disability. If there is any problem keeping the appointment, you must call back to this facility for assistance.  SEEK MEDICAL CARE IF:   You develop increased pain, swelling, redness, drainage, or bleeding in the wound site.   You develop signs of generalized infection including muscle aches, chills, fever, or a general ill feeling.   You have an oral temperature above 102 F (38.9 C).  MAKE SURE YOU:   Understand these instructions.   Will watch your condition.   Will get help right away if you are not doing well or get worse.  Document Released: 12/08/2004 Document Revised: 02/17/2011 Document Reviewed: 10/02/2007 [facility] Patient Information 2012 [place], [place].

## 2012-01-22 ENCOUNTER — Encounter (HOSPITAL_COMMUNITY): Payer: Self-pay | Admitting: Emergency Medicine

## 2012-01-22 ENCOUNTER — Emergency Department (HOSPITAL_COMMUNITY): Payer: 59

## 2012-01-22 ENCOUNTER — Emergency Department (HOSPITAL_COMMUNITY)
Admission: EM | Admit: 2012-01-22 | Discharge: 2012-01-22 | Disposition: A | Discharge status: Home / Self Care | Payer: 59 | Attending: Emergency Medicine | Admitting: Emergency Medicine

## 2012-01-22 DIAGNOSIS — N390 Urinary tract infection, site not specified: Secondary | ICD-10-CM | POA: Insufficient documentation

## 2012-01-22 DIAGNOSIS — J45909 Unspecified asthma, uncomplicated: Secondary | ICD-10-CM | POA: Insufficient documentation

## 2012-01-22 DIAGNOSIS — E119 Type 2 diabetes mellitus without complications: Secondary | ICD-10-CM | POA: Insufficient documentation

## 2012-01-22 DIAGNOSIS — R1110 Vomiting, unspecified: Secondary | ICD-10-CM | POA: Insufficient documentation

## 2012-01-22 DIAGNOSIS — I10 Essential (primary) hypertension: Secondary | ICD-10-CM | POA: Insufficient documentation

## 2012-01-22 DIAGNOSIS — Z7982 Long term (current) use of aspirin: Secondary | ICD-10-CM | POA: Insufficient documentation

## 2012-01-22 DIAGNOSIS — F17200 Nicotine dependence, unspecified, uncomplicated: Secondary | ICD-10-CM | POA: Insufficient documentation

## 2012-01-22 LAB — URINALYSIS, ROUTINE W REFLEX MICROSCOPIC
Ketones, ur: >80 mg/dL — AB
Leukocytes, UA: NEGATIVE
Nitrite: NEGATIVE
pH: 5.5 (ref 5.0–8.0)

## 2012-01-22 LAB — BASIC METABOLIC PANEL
Calcium: 9 mg/dL (ref 8.4–10.5)
Creatinine, Ser: 0.56 mg/dL (ref 0.50–1.10)
GFR calc Af Amer: >90 mL/min (ref >90)
GFR calc non Af Amer: >90 mL/min (ref >90)
Sodium: 134 mEq/L — ABNORMAL LOW (ref 135–145)

## 2012-01-22 LAB — URINE MICROSCOPIC-ADD ON

## 2012-01-22 LAB — CBC WITH DIFFERENTIAL/PLATELET
Basophils Absolute: 0 10*3/uL (ref 0.0–0.1)
Eosinophils Relative: 0 % (ref 0–5)
Lymphocytes Relative: 13 % (ref 12–46)
MCV: 96.6 fL (ref 78.0–100.0)
Neutro Abs: 10.1 10*3/uL — ABNORMAL HIGH (ref 1.7–7.7)
Neutrophils Relative %: 79 % — ABNORMAL HIGH (ref 43–77)
Platelets: 200 10*3/uL (ref 150–400)
RDW: 12.4 % (ref 11.5–15.5)
WBC: 12.8 10*3/uL — ABNORMAL HIGH (ref 4.0–10.5)

## 2012-01-22 MED ORDER — OXYCODONE-ACETAMINOPHEN 5-325 MG PO TABS: 1.0000 | ORAL_TABLET | ORAL | Status: DC | PRN

## 2012-01-22 MED ORDER — IOHEXOL 300 MG/ML  SOLN
100.0000 mL | Freq: Once | INTRAMUSCULAR | Status: AC | PRN
  Administered 2012-01-22: 100 mL via INTRAVENOUS

## 2012-01-22 MED ORDER — CIPROFLOXACIN HCL 500 MG PO TABS: 500.0000 mg | ORAL_TABLET | Freq: Two times a day (BID) | ORAL | Status: DC

## 2012-01-22 MED ORDER — ONDANSETRON HCL 4 MG/2ML IJ SOLN
4.0000 mg | Freq: Once | INTRAMUSCULAR | Status: AC
  Administered 2012-01-22: 4 mg via INTRAVENOUS
  Filled 2012-01-22: qty 2

## 2012-01-22 MED ORDER — MORPHINE SULFATE 4 MG/ML IJ SOLN
4.0000 mg | Freq: Once | INTRAMUSCULAR | Status: AC
  Administered 2012-01-22: 4 mg via INTRAVENOUS
  Filled 2012-01-22: qty 1

## 2012-01-22 MED ORDER — ACETAMINOPHEN 500 MG PO TABS
1000.0000 mg | ORAL_TABLET | Freq: Once | ORAL | Status: AC
  Administered 2012-01-22: 1000 mg via ORAL
  Filled 2012-01-22: qty 2

## 2012-01-22 MED ORDER — CIPROFLOXACIN HCL 250 MG PO TABS
500.0000 mg | ORAL_TABLET | Freq: Once | ORAL | Status: AC
  Administered 2012-01-22: 500 mg via ORAL
  Filled 2012-01-22: qty 2

## 2012-01-22 MED ORDER — SODIUM CHLORIDE 0.9 % IV SOLN
INTRAVENOUS | Status: DC
  Administered 2012-01-22: 20:00:00 via INTRAVENOUS

## 2012-01-22 NOTE — ED Provider Notes (Addendum)
History   This chart was scribed for Cheri Guppy, MD by Melba Coon, ED Scribe. The patient was seen in room APA11/APA11 and the patient's care was started at 7:05PM.    CSN: [id number]  Arrival date & time 01/22/12  1728   First MD Initiated Contact with Patient 01/22/12 1855      Chief Complaint  Patient presents with  . Numbness  . Emesis    (Consider location/radiation/quality/duration/timing/severity/associated sxs/prior treatment) The history is provided by the patient. No language interpreter was used.   Alexandria Webster is a 46 y.o. female who presents to the Emergency Department complaining of persistent, moderate right lower abdominal pain with an onset yesterday. Mrs Carte reports no trauma to the affected areas. She reports nausea since yesterday, emesis yesterday but not today, and reports fever that started today. She denies history of appendectomy and denies history of gastric bypass. She is currently having a menstrual cycle. No other pertinent medical symptoms. + anorexia.    PCP: Dr Phillips Odor   Past Medical History  Diagnosis Date  . Asthma   . Diabetes mellitus   . Hypertension     Past Surgical History  Procedure Date  . Femoral artery stent right leg  . Back surgery   . Coronary angioplasty with stent placement   . Coronary artery bypass graft     Family History  Problem Relation Age of Onset  . Stroke Mother   . Hypertension Mother   . Heart failure Father     History  Substance Use Topics  . Smoking status: Current Every Day Smoker -- 0.5 packs/day for 30 years    Types: Cigarettes  . Smokeless tobacco: Never Used  . Alcohol Use: No    OB History    Grav Para Term Preterm Abortions TAB SAB Ect Mult Living   2 1  1 1  1   1       Review of Systems  Gastrointestinal: Positive for vomiting.   10 Systems reviewed and all are negative for acute change except as noted in the HPI.   Allergies  Review of patient's allergies  indicates no known allergies.  Home Medications   Current Outpatient Rx  Name  Route  Sig  Dispense  Refill  . ASPIRIN EC 81 MG PO TBEC   Oral   Take 81 mg by mouth daily.         . BUDESONIDE-FORMOTEROL FUMARATE 160-4.5 MCG/ACT IN AERO   Inhalation   Inhale 2 puffs into the lungs 2 (two) times daily.         Marland Kitchen LOSARTAN POTASSIUM 50 MG PO TABS   Oral   Take 50 mg by mouth daily.         Marland Kitchen SITAGLIPTIN-METFORMIN HCL 50-1000 MG PO TABS   Oral   Take 1 tablet by mouth 2 (two) times daily with a meal.         . PROMETHAZINE HCL 25 MG PO TABS   Oral   Take 1 tablet (25 mg total) by mouth every 6 (six) hours as needed for nausea.   15 tablet   0     BP 162/84  Pulse 127  Temp 100.1 F (37.8 C) (Oral)  Resp 19  Ht 5\' 4"  (1.626 m)  Wt 169 lb (76.658 kg)  BMI 29.01 kg/m2  SpO2 100%  LMP 01/21/2012  Physical Exam  Nursing note and vitals reviewed. Constitutional:       Awake, alert, nontoxic appearance.  HENT:  Head: Atraumatic.  Eyes: Conjunctivae normal and EOM are normal. Pupils are equal, round, and reactive to light. Right eye exhibits no discharge. Left eye exhibits no discharge.  Neck: Neck supple.  Cardiovascular: Regular rhythm, normal heart sounds and intact distal pulses.  Tachycardia present.  Exam reveals no gallop and no friction rub.   No murmur heard.      CR less than 2 sec.  Pulmonary/Chest: Effort normal and breath sounds normal. No respiratory distress. She has no wheezes. She has no rales. She exhibits no tenderness.  Abdominal: Soft. Bowel sounds are normal. There is tenderness. There is no rebound.       rlq tenderness with rebound  Musculoskeletal: She exhibits no tenderness.       Baseline ROM, no obvious new focal weakness.  Neurological:       Mental status and motor strength appears baseline for patient and situation.  Skin: No rash noted.  Psychiatric: She has a normal mood and affect.    ED Course  Procedures (including  critical care time)  DIAGNOSTIC STUDIES: Oxygen Saturation is 100% on room air, normal by my interpretation.    COORDINATION OF CARE:  7:09PM - Mrs Debell has appendicitis.   Labs Reviewed - No data to display No results found.   No diagnosis found.  10:38 PM Pain controlled  10:46 PM I explained, in the presence of her nurse, about the concerning mass on her kidney that needs to be reevaluated for possible "cancer."  The pt stated she understands.     MDM  Abdominal pain  I personally performed the services described in this documentation, which was scribed in my presence. The recorded information has been reviewed and is accurate.        Cheri Guppy, MD 01/22/12 1610  Cheri Guppy, MD 01/22/12 [phone]

## 2012-01-22 NOTE — ED Notes (Addendum)
Patient states she is having pain in her left and right hip, states she is having numbness in her right leg.  States she has a history with blood flow problems in her legs.  States she had episodes of nausea yesterday that went away and started running a fever today.  States that she feels weak in her legs when she walks.   Also complains of numbness in her right arm.

## 2012-01-22 NOTE — ED Notes (Signed)
Patient medicated for fever per protocol in triage.

## 2012-01-22 NOTE — ED Notes (Signed)
Oral contrast complete, CT notified.

## 2012-01-25 LAB — URINE CULTURE: Colony Count: >=100000

## 2012-01-26 NOTE — ED Notes (Signed)
+   urine Patient treated with Cipro-sensitive to same-chart appended per protocol MD. 

## 2012-01-27 ENCOUNTER — Other Ambulatory Visit (HOSPITAL_COMMUNITY): Payer: Self-pay | Admitting: Physician Assistant

## 2012-01-27 DIAGNOSIS — N289 Disorder of kidney and ureter, unspecified: Secondary | ICD-10-CM

## 2012-01-31 ENCOUNTER — Ambulatory Visit (HOSPITAL_COMMUNITY): Payer: 59

## 2012-02-01 ENCOUNTER — Ambulatory Visit (HOSPITAL_COMMUNITY): Payer: PRIVATE HEALTH INSURANCE

## 2012-02-07 ENCOUNTER — Other Ambulatory Visit: Payer: Self-pay | Admitting: Family Medicine

## 2012-02-07 DIAGNOSIS — N289 Disorder of kidney and ureter, unspecified: Secondary | ICD-10-CM

## 2012-02-08 ENCOUNTER — Ambulatory Visit
Admission: RE | Admit: 2012-02-08 | Discharge: 2012-02-08 | Disposition: A | Discharge status: Home / Self Care | Payer: 59 | Source: Ambulatory Visit | Attending: Family Medicine | Admitting: Family Medicine

## 2012-02-08 ENCOUNTER — Other Ambulatory Visit: Payer: Self-pay | Admitting: Family Medicine

## 2012-02-08 ENCOUNTER — Inpatient Hospital Stay: Admission: RE | Admit: 2012-02-08 | Payer: Self-pay | Source: Ambulatory Visit

## 2012-02-08 DIAGNOSIS — N289 Disorder of kidney and ureter, unspecified: Secondary | ICD-10-CM

## 2012-02-08 MED ORDER — GADOBENATE DIMEGLUMINE 529 MG/ML IV SOLN: 14.0000 mL | Freq: Once | INTRAVENOUS | Status: AC | PRN

## 2012-04-24 ENCOUNTER — Ambulatory Visit (INDEPENDENT_AMBULATORY_CARE_PROVIDER_SITE_OTHER): Payer: 59 | Admitting: Urology

## 2012-04-24 ENCOUNTER — Other Ambulatory Visit: Payer: Self-pay | Admitting: Urology

## 2012-04-24 DIAGNOSIS — D4100 Neoplasm of uncertain behavior of unspecified kidney: Secondary | ICD-10-CM

## 2012-04-24 DIAGNOSIS — D4120 Neoplasm of uncertain behavior of unspecified ureter: Secondary | ICD-10-CM

## 2012-05-01 ENCOUNTER — Ambulatory Visit (HOSPITAL_COMMUNITY)
Admission: RE | Admit: 2012-05-01 | Discharge: 2012-05-01 | Disposition: A | Discharge status: Home / Self Care | Payer: 59 | Source: Ambulatory Visit | Attending: Urology | Admitting: Urology

## 2012-05-01 DIAGNOSIS — D4100 Neoplasm of uncertain behavior of unspecified kidney: Secondary | ICD-10-CM | POA: Insufficient documentation

## 2012-05-01 DIAGNOSIS — R9389 Abnormal findings on diagnostic imaging of other specified body structures: Secondary | ICD-10-CM | POA: Insufficient documentation

## 2012-05-01 DIAGNOSIS — D4120 Neoplasm of uncertain behavior of unspecified ureter: Secondary | ICD-10-CM | POA: Insufficient documentation

## 2012-05-01 MED ORDER — GADOBENATE DIMEGLUMINE 529 MG/ML IV SOLN
15.0000 mL | Freq: Once | INTRAVENOUS | Status: AC | PRN
  Administered 2012-05-01: 15 mL via INTRAVENOUS

## 2012-09-19 ENCOUNTER — Encounter (HOSPITAL_COMMUNITY): Payer: Self-pay

## 2012-09-19 ENCOUNTER — Emergency Department (HOSPITAL_COMMUNITY)
Admission: EM | Admit: 2012-09-19 | Discharge: 2012-09-20 | Disposition: A | Discharge status: Home / Self Care | Payer: 59 | Attending: Emergency Medicine | Admitting: Emergency Medicine

## 2012-09-19 DIAGNOSIS — Z7982 Long term (current) use of aspirin: Secondary | ICD-10-CM | POA: Insufficient documentation

## 2012-09-19 DIAGNOSIS — R112 Nausea with vomiting, unspecified: Secondary | ICD-10-CM | POA: Insufficient documentation

## 2012-09-19 DIAGNOSIS — E119 Type 2 diabetes mellitus without complications: Secondary | ICD-10-CM | POA: Insufficient documentation

## 2012-09-19 DIAGNOSIS — Z951 Presence of aortocoronary bypass graft: Secondary | ICD-10-CM | POA: Insufficient documentation

## 2012-09-19 DIAGNOSIS — Y9289 Other specified places as the place of occurrence of the external cause: Secondary | ICD-10-CM | POA: Insufficient documentation

## 2012-09-19 DIAGNOSIS — L03313 Cellulitis of chest wall: Secondary | ICD-10-CM

## 2012-09-19 DIAGNOSIS — L089 Local infection of the skin and subcutaneous tissue, unspecified: Secondary | ICD-10-CM | POA: Insufficient documentation

## 2012-09-19 DIAGNOSIS — Z79899 Other long term (current) drug therapy: Secondary | ICD-10-CM | POA: Insufficient documentation

## 2012-09-19 DIAGNOSIS — F17200 Nicotine dependence, unspecified, uncomplicated: Secondary | ICD-10-CM | POA: Insufficient documentation

## 2012-09-19 DIAGNOSIS — I10 Essential (primary) hypertension: Secondary | ICD-10-CM | POA: Insufficient documentation

## 2012-09-19 DIAGNOSIS — J45909 Unspecified asthma, uncomplicated: Secondary | ICD-10-CM | POA: Insufficient documentation

## 2012-09-19 DIAGNOSIS — Z9861 Coronary angioplasty status: Secondary | ICD-10-CM | POA: Insufficient documentation

## 2012-09-19 DIAGNOSIS — Y9389 Activity, other specified: Secondary | ICD-10-CM | POA: Insufficient documentation

## 2012-09-19 DIAGNOSIS — L02219 Cutaneous abscess of trunk, unspecified: Secondary | ICD-10-CM | POA: Insufficient documentation

## 2012-09-19 NOTE — ED Notes (Signed)
Pt c/o abscess to r side x 3 days.  Pt says had fever, chills, and n/v today.

## 2012-09-20 LAB — BASIC METABOLIC PANEL
BUN: 11 mg/dL (ref 6–23)
CO2: 27 mEq/L (ref 19–32)
Chloride: 98 mEq/L (ref 96–112)
Glucose, Bld: 281 mg/dL — ABNORMAL HIGH (ref 70–99)
Potassium: 3.8 mEq/L (ref 3.5–5.1)
Sodium: 135 mEq/L (ref 135–145)

## 2012-09-20 LAB — CBC WITH DIFFERENTIAL/PLATELET
Hemoglobin: 15.4 g/dL — ABNORMAL HIGH (ref 12.0–15.0)
Lymphocytes Relative: 15 % (ref 12–46)
Lymphs Abs: 2.6 10*3/uL (ref 0.7–4.0)
MCH: 34.4 pg — ABNORMAL HIGH (ref 26.0–34.0)
Monocytes Relative: 8 % (ref 3–12)
Neutro Abs: 12.6 10*3/uL — ABNORMAL HIGH (ref 1.7–7.7)
Neutrophils Relative %: 76 % (ref 43–77)
RBC: 4.48 MIL/uL (ref 3.87–5.11)
WBC: 16.7 10*3/uL — ABNORMAL HIGH (ref 4.0–10.5)

## 2012-09-20 MED ORDER — VANCOMYCIN HCL IN DEXTROSE 1-5 GM/200ML-% IV SOLN
1000.0000 mg | Freq: Once | INTRAVENOUS | Status: AC
  Administered 2012-09-20: 1000 mg via INTRAVENOUS
  Filled 2012-09-20: qty 200

## 2012-09-20 MED ORDER — LIDOCAINE HCL (PF) 1 % IJ SOLN
INTRAMUSCULAR | Status: AC
  Administered 2012-09-20: 06:00:00
  Filled 2012-09-20: qty 5

## 2012-09-20 MED ORDER — MORPHINE SULFATE 4 MG/ML IJ SOLN
4.0000 mg | Freq: Once | INTRAMUSCULAR | Status: AC
  Administered 2012-09-20: 4 mg via INTRAVENOUS
  Filled 2012-09-20: qty 1

## 2012-09-20 MED ORDER — SULFAMETHOXAZOLE-TRIMETHOPRIM 800-160 MG PO TABS: 1.0000 | ORAL_TABLET | Freq: Two times a day (BID) | ORAL | Status: DC

## 2012-09-20 MED ORDER — SODIUM CHLORIDE 0.9 % IV SOLN
1000.0000 mL | INTRAVENOUS | Status: DC
  Administered 2012-09-20: 1000 mL via INTRAVENOUS

## 2012-09-20 MED ORDER — TRAMADOL HCL 50 MG PO TABS: 100.0000 mg | ORAL_TABLET | Freq: Four times a day (QID) | ORAL | Status: DC | PRN

## 2012-09-20 MED ORDER — ONDANSETRON HCL 4 MG/2ML IJ SOLN
4.0000 mg | Freq: Once | INTRAMUSCULAR | Status: AC
  Administered 2012-09-20: 4 mg via INTRAVENOUS
  Filled 2012-09-20: qty 2

## 2012-09-20 MED ORDER — SODIUM CHLORIDE 0.9 % IV SOLN: 1000.0000 mL | Freq: Once | INTRAVENOUS | Status: DC

## 2012-09-20 NOTE — ED Provider Notes (Signed)
History    CSN: [id number] Arrival date & time 09/19/12  1729  First MD Initiated Contact with Patient 09/20/12 0003     Chief Complaint  Patient presents with  . Abscess   (Consider location/radiation/quality/duration/timing/severity/associated sxs/prior Treatment)  HPI Patient states about 3 days ago she noted a area on  her right side that started out as a small red spot and has gotten larger. She states it is painful to touch. She denies any fever at home and was surprised when she had fever when she arrived to the ED. She states she thought maybe she had a spider bite because they were under a tent before this happened. She did not see a spider. She states she's never had abscesses or boils in the past. She has had nausea and vomiting 3 times today without diarrhea. Patient has diabetes and states her CBGs have been in the 100s the last couple days.   PCP Dr Phillips Odor  Past Medical History  Diagnosis Date  . Asthma   . Diabetes mellitus   . Hypertension    Past Surgical History  Procedure Laterality Date  . Femoral artery stent  right leg  . Back surgery    . Coronary angioplasty with stent placement    . Coronary artery bypass graft     Family History  Problem Relation Age of Onset  . Stroke Mother   . Hypertension Mother   . Heart failure Father    History  Substance Use Topics  . Smoking status: Current Every Day Smoker -- 0.50 packs/day for 30 years    Types: Cigarettes  . Smokeless tobacco: Never Used  . Alcohol Use: No   lives at home Unemployed   OB History   Grav Para Term Preterm Abortions TAB SAB Ect Mult Living   2 1  1 1  1   1      Review of Systems  All other systems reviewed and are negative.    Allergies  Review of patient's allergies indicates no known allergies.  Home Medications   Current Outpatient Rx  Name  Route  Sig  Dispense  Refill  . aspirin EC 81 MG tablet   Oral   Take 81 mg by mouth daily.         .  budesonide-formoterol (SYMBICORT) 160-4.5 MCG/ACT inhaler   Inhalation   Inhale 2 puffs into the lungs 2 (two) times daily.         . hydroxypropyl methylcellulose (ISOPTO TEARS) 2.5 % ophthalmic solution   Both Eyes   Place 1 drop into both eyes 2 (two) times daily.         Marland Kitchen losartan (COZAAR) 50 MG tablet   Oral   Take 50 mg by mouth daily.         . sitaGLIPtan-metformin (JANUMET) 50-1000 MG per tablet   Oral   Take 1 tablet by mouth 2 (two) times daily with a meal.          BP 116/64  Pulse 101  Temp(Src) 100 F (37.8 C) (Oral)  Resp 20  Ht 5\' 4"  (1.626 m)  Wt 166 lb (75.297 kg)  BMI 28.48 kg/m2  SpO2 98%  Vital signs normal except low grade fever  Physical Exam  Nursing note and vitals reviewed. Constitutional: She is oriented to person, place, and time. She appears well-developed and well-nourished.  Non-toxic appearance. She does not appear ill. No distress.  HENT:  Head: Normocephalic and atraumatic.  Right Ear:  External ear normal.  Left Ear: External ear normal.  Nose: Nose normal. No mucosal edema or rhinorrhea.  Mouth/Throat: Oropharynx is clear and moist and mucous membranes are normal. No dental abscesses or edematous.  Eyes: Conjunctivae and EOM are normal. Pupils are equal, round, and reactive to light.  Neck: Normal range of motion and full passive range of motion without pain. Neck supple.  Cardiovascular: Normal rate, regular rhythm and normal heart sounds.  Exam reveals no gallop and no friction rub.   No murmur heard. Pulmonary/Chest: Effort normal and breath sounds normal. No respiratory distress. She has no wheezes. She has no rhonchi. She has no rales.   She exhibits no tenderness and no crepitus.    Pt has a large reddened area on her lateral right lower chest wall that is approximately 7 cm wide and 16 cm long. She has a 2 cm area of induration in the center of the redness with a small area of black in the center.  Abdominal: Normal  appearance.  Musculoskeletal: Normal range of motion. She exhibits no edema and no tenderness.  Moves all extremities well.   Neurological: She is alert and oriented to person, place, and time. She has normal strength. No cranial nerve deficit.  Skin: Skin is warm, dry and intact. No rash noted. No erythema. No pallor.  Psychiatric: She has a normal mood and affect. Her speech is normal and behavior is normal. Her mood appears not anxious.    ED Course  Procedures (including critical care time)  Medications  0.9 %  sodium chloride infusion (not administered)    Followed by  0.9 %  sodium chloride infusion (1,000 mLs Intravenous New Bag/Given 09/20/12 0104)  vancomycin (VANCOCIN) IVPB 1000 mg/200 mL premix (0 mg Intravenous Stopped 09/20/12 0206)  morphine 4 MG/ML injection 4 mg (4 mg Intravenous Given 09/20/12 0104)  ondansetron (ZOFRAN) injection 4 mg (4 mg Intravenous Given 09/20/12 0104)  lidocaine (PF) (XYLOCAINE) 1 % injection (  Given 09/20/12 0532)   We discussed patient should consider being admitted to the hospital for more IV antibiotics, however she states she has 2 children and has no one to  take care of them. One of her children is in the room with her at this time. She states she has to go home.  INCISION AND DRAINAGE Performed by: Devoria Albe L Consent: Verbal consent obtained. Risks and benefits: risks, benefits and alternatives were discussed Type: abscess  Body area: right chest    Anesthesia: local infiltration  Incision was made with an 11 blade scalpel.  Local anesthetic: lidocaine 1%   Anesthetic total: 3 ml  Complexity: complex Blunt dissection to break up loculations  Drainage: bloody  Drainage amount: minimal  Packing material: 1/4 in iodoform gauze  Patient tolerance: Patient tolerated the procedure well with no immediate complications.     Results for orders placed during the hospital encounter of 09/19/12  CBC WITH DIFFERENTIAL      Result  Value Range   WBC 16.7 (*) 4.0 - 10.5 K/uL   RBC 4.48  3.87 - 5.11 MIL/uL   Hemoglobin 15.4 (*) 12.0 - 15.0 g/dL   HCT 16.1  09.6 - 04.5 %   MCV 96.2  78.0 - 100.0 fL   MCH 34.4 (*) 26.0 - 34.0 pg   MCHC 35.7  30.0 - 36.0 g/dL   RDW 40.9  81.1 - 91.4 %   Platelets 237  150 - 400 K/uL   Neutrophils Relative % 76  43 - 77 %   Neutro Abs 12.6 (*) 1.7 - 7.7 K/uL   Lymphocytes Relative 15  12 - 46 %   Lymphs Abs 2.6  0.7 - 4.0 K/uL   Monocytes Relative 8  3 - 12 %   Monocytes Absolute 1.4 (*) 0.1 - 1.0 K/uL   Eosinophils Relative 1  0 - 5 %   Eosinophils Absolute 0.1  0.0 - 0.7 K/uL   Basophils Relative 0  0 - 1 %   Basophils Absolute 0.0  0.0 - 0.1 K/uL  BASIC METABOLIC PANEL      Result Value Range   Sodium 135  135 - 145 mEq/L   Potassium 3.8  3.5 - 5.1 mEq/L   Chloride 98  96 - 112 mEq/L   CO2 27  19 - 32 mEq/L   Glucose, Bld 281 (*) 70 - 99 mg/dL   BUN 11  6 - 23 mg/dL   Creatinine, Ser 1.61 (*) 0.50 - 1.10 mg/dL   Calcium 9.9  8.4 - 09.6 mg/dL   GFR calc non Af Amer >90  >90 mL/min   GFR calc Af Amer >90  >90 mL/min   Laboratory interpretation all normal except hyperglycemia, leukocytosis   No results found. 1. Brown recluse spider bite, initial encounter   2. Cellulitis of chest wall     New Prescriptions   SULFAMETHOXAZOLE-TRIMETHOPRIM (SEPTRA DS) 800-160 MG PER TABLET    Take 1 tablet by mouth every 12 (twelve) hours.   TRAMADOL (ULTRAM) 50 MG TABLET    Take 2 tablets (100 mg total) by mouth every 6 (six) hours as needed.    Plan discharge   Devoria Albe, MD, FACEP   MDM    Ward Givens, MD 09/20/12 [phone]

## 2012-09-22 ENCOUNTER — Encounter (HOSPITAL_COMMUNITY): Payer: Self-pay

## 2012-09-22 ENCOUNTER — Emergency Department (HOSPITAL_COMMUNITY)
Admission: EM | Admit: 2012-09-22 | Discharge: 2012-09-22 | Disposition: A | Discharge status: Home / Self Care | Payer: 59 | Attending: Emergency Medicine | Admitting: Emergency Medicine

## 2012-09-22 DIAGNOSIS — L02219 Cutaneous abscess of trunk, unspecified: Secondary | ICD-10-CM | POA: Insufficient documentation

## 2012-09-22 DIAGNOSIS — R5381 Other malaise: Secondary | ICD-10-CM | POA: Insufficient documentation

## 2012-09-22 DIAGNOSIS — I10 Essential (primary) hypertension: Secondary | ICD-10-CM | POA: Insufficient documentation

## 2012-09-22 DIAGNOSIS — Z951 Presence of aortocoronary bypass graft: Secondary | ICD-10-CM | POA: Insufficient documentation

## 2012-09-22 DIAGNOSIS — F17200 Nicotine dependence, unspecified, uncomplicated: Secondary | ICD-10-CM | POA: Insufficient documentation

## 2012-09-22 DIAGNOSIS — L0390 Cellulitis, unspecified: Secondary | ICD-10-CM

## 2012-09-22 DIAGNOSIS — Z79899 Other long term (current) drug therapy: Secondary | ICD-10-CM | POA: Insufficient documentation

## 2012-09-22 DIAGNOSIS — R509 Fever, unspecified: Secondary | ICD-10-CM | POA: Insufficient documentation

## 2012-09-22 DIAGNOSIS — E1169 Type 2 diabetes mellitus with other specified complication: Secondary | ICD-10-CM | POA: Insufficient documentation

## 2012-09-22 DIAGNOSIS — J45909 Unspecified asthma, uncomplicated: Secondary | ICD-10-CM | POA: Insufficient documentation

## 2012-09-22 DIAGNOSIS — Z9861 Coronary angioplasty status: Secondary | ICD-10-CM | POA: Insufficient documentation

## 2012-09-22 DIAGNOSIS — Z7982 Long term (current) use of aspirin: Secondary | ICD-10-CM | POA: Insufficient documentation

## 2012-09-22 DIAGNOSIS — IMO0002 Reserved for concepts with insufficient information to code with codable children: Secondary | ICD-10-CM | POA: Insufficient documentation

## 2012-09-22 LAB — CBC WITH DIFFERENTIAL/PLATELET
Basophils Absolute: 0 10*3/uL (ref 0.0–0.1)
Eosinophils Relative: 3 % (ref 0–5)
HCT: 41.2 % (ref 36.0–46.0)
Hemoglobin: 14.4 g/dL (ref 12.0–15.0)
Lymphocytes Relative: 17 % (ref 12–46)
MCHC: 35 g/dL (ref 30.0–36.0)
MCV: 96 fL (ref 78.0–100.0)
Monocytes Absolute: 0.7 10*3/uL (ref 0.1–1.0)
Monocytes Relative: 7 % (ref 3–12)
RDW: 12.5 % (ref 11.5–15.5)

## 2012-09-22 LAB — BASIC METABOLIC PANEL
BUN: 13 mg/dL (ref 6–23)
CO2: 27 mEq/L (ref 19–32)
Chloride: 96 mEq/L (ref 96–112)
Creatinine, Ser: 0.5 mg/dL (ref 0.50–1.10)
Potassium: 3.6 mEq/L (ref 3.5–5.1)

## 2012-09-22 NOTE — ED Notes (Signed)
Patient with no complaints at this time. Respirations even and unlabored. Skin warm/dry. Discharge instructions reviewed with patient at this time. Patient given opportunity to voice concerns/ask questions. IV removed per policy and band-aid applied to site. Patient discharged at this time and left Emergency Department with steady gait.  

## 2012-09-22 NOTE — ED Notes (Signed)
Pt was seen on Wednesday for a ?abcess to right flank area, was treated. This am woke feeling bad and thinks her bs is elevated,

## 2012-09-22 NOTE — ED Provider Notes (Signed)
History    CSN: [id number] Arrival date & time 09/22/12  2956  First MD Initiated Contact with Patient 09/22/12 0957     Chief Complaint  Patient presents with  . Wound Check  . Hyperglycemia   (Consider location/radiation/quality/duration/timing/severity/associated sxs/prior Treatment) HPI Comments: Alexandria Webster is a 47 y.o. Female with a 3 day history of right abdominal wall cellulitis which was thought to have started with a brown recluse spider bite as she had been camping when the symptoms began and she had central necrosis at the wound site upon first presentation.  She returns this am for a recheck as she felt "bad" when she woke this am,  Stating she felt weak and shaky.  She had nothing to eat yesterday,  But after she ate a large coconut cookie this am this symptom improved.  She is taking septra for infection,  Her last dose was taken last night.  She denies fevers or chills today,  Although she had subjective fever yesterday.  Her area of infection has not had excessive drainage nor is the surrounding redness worsened.     The history is provided by the patient.   Past Medical History  Diagnosis Date  . Asthma   . Diabetes mellitus   . Hypertension    Past Surgical History  Procedure Laterality Date  . Femoral artery stent  right leg  . Back surgery    . Coronary angioplasty with stent placement    . Coronary artery bypass graft     Family History  Problem Relation Age of Onset  . Stroke Mother   . Hypertension Mother   . Heart failure Father    History  Substance Use Topics  . Smoking status: Current Every Day Smoker -- 0.50 packs/day for 30 years    Types: Cigarettes  . Smokeless tobacco: Never Used  . Alcohol Use: No   OB History   Grav Para Term Preterm Abortions TAB SAB Ect Mult Living   2 1  1 1  1   1      Review of Systems  Constitutional: Positive for fever.  HENT: Negative for congestion, sore throat and neck pain.   Eyes: Negative.    Respiratory: Negative for chest tightness and shortness of breath.   Cardiovascular: Negative for chest pain.  Gastrointestinal: Negative for nausea and abdominal pain.  Genitourinary: Negative.   Musculoskeletal: Negative for joint swelling and arthralgias.  Skin: Positive for color change and wound. Negative for rash.  Neurological: Positive for weakness. Negative for dizziness, light-headedness, numbness and headaches.  Psychiatric/Behavioral: Negative.     Allergies  Review of patient's allergies indicates no known allergies.  Home Medications   Current Outpatient Rx  Name  Route  Sig  Dispense  Refill  . aspirin EC 81 MG tablet   Oral   Take 81 mg by mouth daily.         . budesonide-formoterol (SYMBICORT) 160-4.5 MCG/ACT inhaler   Inhalation   Inhale 2 puffs into the lungs 2 (two) times daily.         . hydroxypropyl methylcellulose (ISOPTO TEARS) 2.5 % ophthalmic solution   Both Eyes   Place 1 drop into both eyes 2 (two) times daily.         Marland Kitchen losartan (COZAAR) 50 MG tablet   Oral   Take 50 mg by mouth daily.         . sitaGLIPtan-metformin (JANUMET) 50-1000 MG per tablet   Oral  Take 1 tablet by mouth 2 (two) times daily with a meal.         . sulfamethoxazole-trimethoprim (SEPTRA DS) 800-160 MG per tablet   Oral   Take 1 tablet by mouth every 12 (twelve) hours.   20 tablet   0   . traMADol (ULTRAM) 50 MG tablet   Oral   Take 2 tablets (100 mg total) by mouth every 6 (six) hours as needed.   16 tablet   0    BP 156/84  Pulse 99  Temp(Src) 98.3 F (36.8 C) (Oral)  Resp 20  Ht 5\' 4"  (1.626 m)  Wt 166 lb (75.297 kg)  BMI 28.48 kg/m2  SpO2 99% Physical Exam  Constitutional: She appears well-developed and well-nourished. No distress.  HENT:  Head: Normocephalic.  Neck: Neck supple.  Cardiovascular: Normal rate.   Pulmonary/Chest: Effort normal. She has no wheezes.  Musculoskeletal: Normal range of motion. She exhibits no edema.  Skin:  Skin is warm and dry. No rash noted. There is erythema.  No active drainage from abscess I and D site,  Packing in place.  2 cm induration persists,  Surrounding erythema reduced from initial visit.     ED Course  Procedures (including critical care time) Labs Reviewed  BASIC METABOLIC PANEL - Abnormal; Notable for the following:    Sodium 134 (*)    Glucose, Bld 217 (*)    All other components within normal limits  GLUCOSE, CAPILLARY - Abnormal; Notable for the following:    Glucose-Capillary 204 (*)    All other components within normal limits  CBC WITH DIFFERENTIAL   No results found. 1. Cellulitis and abscess     MDM  Patients labs and/or radiological studies were viewed and considered during the medical decision making and disposition process. Pt encouraged warm soaks now that packing is removed.  Continue with abx, tramadol, frequent cbg checks (states she already checks her blood glucose 2-3 times daily).  F/u with pcp for a recheck if not continuing to improve over within 3 days.  Burgess Amor, PA-C 09/22/12 1153

## 2012-09-22 NOTE — ED Provider Notes (Signed)
Medical screening examination/treatment/procedure(s) were conducted as a shared visit with non-physician practitioner(s) and myself.  I personally evaluated the patient during the encounter  Pt stable, not septic appearing, appears appropriate for outpatient management Pt agreeable with plan  Joya Gaskins, MD 09/22/12 1531

## 2012-11-05 ENCOUNTER — Encounter (HOSPITAL_COMMUNITY): Payer: Self-pay | Admitting: *Deleted

## 2012-11-05 ENCOUNTER — Emergency Department (HOSPITAL_COMMUNITY)
Admission: EM | Admit: 2012-11-05 | Discharge: 2012-11-05 | Disposition: A | Discharge status: Home / Self Care | Payer: 59 | Attending: Emergency Medicine | Admitting: Emergency Medicine

## 2012-11-05 DIAGNOSIS — Y9239 Other specified sports and athletic area as the place of occurrence of the external cause: Secondary | ICD-10-CM | POA: Insufficient documentation

## 2012-11-05 DIAGNOSIS — L0201 Cutaneous abscess of face: Secondary | ICD-10-CM | POA: Insufficient documentation

## 2012-11-05 DIAGNOSIS — E119 Type 2 diabetes mellitus without complications: Secondary | ICD-10-CM | POA: Insufficient documentation

## 2012-11-05 DIAGNOSIS — L03211 Cellulitis of face: Secondary | ICD-10-CM | POA: Insufficient documentation

## 2012-11-05 DIAGNOSIS — F17200 Nicotine dependence, unspecified, uncomplicated: Secondary | ICD-10-CM | POA: Insufficient documentation

## 2012-11-05 DIAGNOSIS — Z7982 Long term (current) use of aspirin: Secondary | ICD-10-CM | POA: Insufficient documentation

## 2012-11-05 DIAGNOSIS — Z79899 Other long term (current) drug therapy: Secondary | ICD-10-CM | POA: Insufficient documentation

## 2012-11-05 DIAGNOSIS — Y9389 Activity, other specified: Secondary | ICD-10-CM | POA: Insufficient documentation

## 2012-11-05 DIAGNOSIS — Z951 Presence of aortocoronary bypass graft: Secondary | ICD-10-CM | POA: Insufficient documentation

## 2012-11-05 DIAGNOSIS — J45909 Unspecified asthma, uncomplicated: Secondary | ICD-10-CM | POA: Insufficient documentation

## 2012-11-05 DIAGNOSIS — L089 Local infection of the skin and subcutaneous tissue, unspecified: Secondary | ICD-10-CM | POA: Insufficient documentation

## 2012-11-05 MED ORDER — OXYCODONE-ACETAMINOPHEN 5-325 MG PO TABS: 2.0000 | ORAL_TABLET | ORAL | Status: DC | PRN

## 2012-11-05 MED ORDER — MORPHINE SULFATE 4 MG/ML IJ SOLN
4.0000 mg | Freq: Once | INTRAMUSCULAR | Status: AC
  Administered 2012-11-05: 4 mg via INTRAVENOUS
  Filled 2012-11-05: qty 1

## 2012-11-05 MED ORDER — VANCOMYCIN HCL IN DEXTROSE 1-5 GM/200ML-% IV SOLN
1000.0000 mg | Freq: Once | INTRAVENOUS | Status: AC
  Administered 2012-11-05: 1000 mg via INTRAVENOUS

## 2012-11-05 MED ORDER — ONDANSETRON HCL 4 MG/2ML IJ SOLN
4.0000 mg | Freq: Once | INTRAMUSCULAR | Status: AC
  Administered 2012-11-05: 4 mg via INTRAVENOUS
  Filled 2012-11-05: qty 2

## 2012-11-05 MED ORDER — SULFAMETHOXAZOLE-TRIMETHOPRIM 800-160 MG PO TABS: 1.0000 | ORAL_TABLET | Freq: Two times a day (BID) | ORAL | Status: DC

## 2012-11-05 NOTE — ED Provider Notes (Signed)
CSN: [id number]     Arrival date & time 11/05/12  0920 History  This chart was scribed for Donnetta Hutching, MD by Leone Payor, ED Scribe. This patient was seen in room APA19/APA19 and the patient's care was started 9:36 AM.  Chief Complaint  Patient presents with  . Facial Swelling  . Insect Bite    The history is provided by the patient. No language interpreter was used.    HPI Comments: Alexandria Webster is a 47 y.o. female who presents to the Emergency Department complaining of an insect bite to the left jaw that occurred 3 days ago while camping. Pt states the area is swelling which is constant and gradually worsening. Pt is also a diabetic and takes medication in pill form.   Pt is a current everyday smoker but denies alcohol use.   Past Medical History  Diagnosis Date  . Asthma   . Diabetes mellitus   . Hypertension    Past Surgical History  Procedure Laterality Date  . Femoral artery stent  right leg  . Back surgery    . Coronary angioplasty with stent placement    . Coronary artery bypass graft     Family History  Problem Relation Age of Onset  . Stroke Mother   . Hypertension Mother   . Heart failure Father    History  Substance Use Topics  . Smoking status: Current Every Day Smoker -- 0.50 packs/day for 30 years    Types: Cigarettes  . Smokeless tobacco: Never Used  . Alcohol Use: No   OB History   Grav Para Term Preterm Abortions TAB SAB Ect Mult Living   2 1  1 1  1   1      Review of Systems A complete 10 system review of systems was obtained and all systems are negative except as noted in the HPI and PMH.   Allergies  Review of patient's allergies indicates no known allergies.  Home Medications   Current Outpatient Rx  Name  Route  Sig  Dispense  Refill  . aspirin EC 81 MG tablet   Oral   Take 81 mg by mouth daily.         . budesonide-formoterol (SYMBICORT) 160-4.5 MCG/ACT inhaler   Inhalation   Inhale 2 puffs into the lungs 2 (two) times  daily.         . hydroxypropyl methylcellulose (ISOPTO TEARS) 2.5 % ophthalmic solution   Both Eyes   Place 1 drop into both eyes 2 (two) times daily.         Marland Kitchen losartan (COZAAR) 50 MG tablet   Oral   Take 50 mg by mouth daily.         . sitaGLIPtan-metformin (JANUMET) 50-1000 MG per tablet   Oral   Take 1 tablet by mouth 2 (two) times daily with a meal.         . sulfamethoxazole-trimethoprim (SEPTRA DS) 800-160 MG per tablet   Oral   Take 1 tablet by mouth every 12 (twelve) hours.   20 tablet   0   . traMADol (ULTRAM) 50 MG tablet   Oral   Take 2 tablets (100 mg total) by mouth every 6 (six) hours as needed.   16 tablet   0    BP 119/93  Pulse 105  Temp(Src) 98.4 F (36.9 C) (Oral)  Resp 16  Ht 5\' 4"  (1.626 m)  Wt 164 lb (74.39 kg)  BMI 28.14 kg/m2  SpO2 100%  Physical Exam  Nursing note and vitals reviewed. Constitutional: She is oriented to person, place, and time. She appears well-developed and well-nourished.  HENT:  Head: Normocephalic and atraumatic.  Eyes: Conjunctivae and EOM are normal. Pupils are equal, round, and reactive to light.  Neck: Normal range of motion. Neck supple.  Pulmonary/Chest: Effort normal.  Musculoskeletal: Normal range of motion.  Neurological: She is alert and oriented to person, place, and time.  Skin: Skin is warm and dry.  Left chin area has area of induration about 2.5 cm diameter with erythema.   Psychiatric: She has a normal mood and affect.    ED Course  DIAGNOSTIC STUDIES: Oxygen Saturation is 100% on RA, normal by my interpretation.    COORDINATION OF CARE: 9:40 AM Discussed treatment plan with pt at bedside and pt agreed to plan. Will prescribe vancomycin for the cellulitis.   Procedures (including critical care time)  Labs Reviewed - No data to display No results found. No diagnosis found.  MDM  Hx and PE c/w cellulitis.  RX iv Vancomycin.  PO septra   I personally performed the services described in  this documentation, which was scribed in my presence. The recorded information has been reviewed and is accurate.    Donnetta Hutching, MD 11/05/12 1246

## 2012-11-05 NOTE — ED Notes (Signed)
Pt called for ride

## 2012-11-05 NOTE — ED Notes (Signed)
Pt has scabbed over sore to left side of face. States she was camping and something bit her

## 2012-11-05 NOTE — ED Notes (Signed)
Noticed insect bite to jaw while camping 3 days ago. Swelling to area x 2 days.

## 2012-11-05 NOTE — ED Notes (Signed)
Pt request pain medication 

## 2012-11-07 ENCOUNTER — Inpatient Hospital Stay (HOSPITAL_COMMUNITY)
Admission: EM | Admit: 2012-11-07 | Discharge: 2012-11-14 | DRG: 603 | Disposition: A | Discharge status: Home / Self Care | Payer: 59 | Attending: Internal Medicine | Admitting: Internal Medicine

## 2012-11-07 ENCOUNTER — Encounter (HOSPITAL_COMMUNITY): Payer: Self-pay | Admitting: *Deleted

## 2012-11-07 DIAGNOSIS — I2510 Atherosclerotic heart disease of native coronary artery without angina pectoris: Secondary | ICD-10-CM | POA: Diagnosis present

## 2012-11-07 DIAGNOSIS — R000 Tachycardia, unspecified: Secondary | ICD-10-CM | POA: Diagnosis present

## 2012-11-07 DIAGNOSIS — T63391A Toxic effect of venom of other spider, accidental (unintentional), initial encounter: Secondary | ICD-10-CM | POA: Diagnosis present

## 2012-11-07 DIAGNOSIS — E669 Obesity, unspecified: Secondary | ICD-10-CM | POA: Diagnosis present

## 2012-11-07 DIAGNOSIS — Z9861 Coronary angioplasty status: Secondary | ICD-10-CM

## 2012-11-07 DIAGNOSIS — I739 Peripheral vascular disease, unspecified: Secondary | ICD-10-CM | POA: Diagnosis not present

## 2012-11-07 DIAGNOSIS — E876 Hypokalemia: Secondary | ICD-10-CM | POA: Diagnosis not present

## 2012-11-07 DIAGNOSIS — N179 Acute kidney failure, unspecified: Secondary | ICD-10-CM | POA: Diagnosis not present

## 2012-11-07 DIAGNOSIS — E871 Hypo-osmolality and hyponatremia: Secondary | ICD-10-CM | POA: Diagnosis present

## 2012-11-07 DIAGNOSIS — Z951 Presence of aortocoronary bypass graft: Secondary | ICD-10-CM

## 2012-11-07 DIAGNOSIS — L0201 Cutaneous abscess of face: Principal | ICD-10-CM | POA: Diagnosis present

## 2012-11-07 DIAGNOSIS — E119 Type 2 diabetes mellitus without complications: Secondary | ICD-10-CM | POA: Diagnosis present

## 2012-11-07 DIAGNOSIS — A4902 Methicillin resistant Staphylococcus aureus infection, unspecified site: Secondary | ICD-10-CM | POA: Diagnosis present

## 2012-11-07 DIAGNOSIS — J45909 Unspecified asthma, uncomplicated: Secondary | ICD-10-CM | POA: Diagnosis present

## 2012-11-07 DIAGNOSIS — R112 Nausea with vomiting, unspecified: Secondary | ICD-10-CM | POA: Diagnosis present

## 2012-11-07 DIAGNOSIS — IMO0001 Reserved for inherently not codable concepts without codable children: Secondary | ICD-10-CM | POA: Diagnosis present

## 2012-11-07 DIAGNOSIS — L03211 Cellulitis of face: Principal | ICD-10-CM | POA: Diagnosis present

## 2012-11-07 DIAGNOSIS — I10 Essential (primary) hypertension: Secondary | ICD-10-CM | POA: Diagnosis present

## 2012-11-07 DIAGNOSIS — Z6826 Body mass index (BMI) 26.0-26.9, adult: Secondary | ICD-10-CM

## 2012-11-07 DIAGNOSIS — D72829 Elevated white blood cell count, unspecified: Secondary | ICD-10-CM | POA: Diagnosis present

## 2012-11-07 DIAGNOSIS — K5900 Constipation, unspecified: Secondary | ICD-10-CM | POA: Diagnosis present

## 2012-11-07 DIAGNOSIS — F17200 Nicotine dependence, unspecified, uncomplicated: Secondary | ICD-10-CM | POA: Diagnosis present

## 2012-11-07 DIAGNOSIS — E1165 Type 2 diabetes mellitus with hyperglycemia: Secondary | ICD-10-CM | POA: Diagnosis present

## 2012-11-07 HISTORY — DX: Peripheral vascular disease, unspecified: I73.9

## 2012-11-07 LAB — GLUCOSE, CAPILLARY
Glucose-Capillary: 321 mg/dL — ABNORMAL HIGH (ref 70–99)
Glucose-Capillary: 233 mg/dL — ABNORMAL HIGH (ref 70–99)

## 2012-11-07 LAB — CBC WITH DIFFERENTIAL/PLATELET
Basophils Relative: 0 % (ref 0–1)
Eosinophils Absolute: 0.1 10*3/uL (ref 0.0–0.7)
Eosinophils Relative: 1 % (ref 0–5)
Hemoglobin: 15.7 g/dL — ABNORMAL HIGH (ref 12.0–15.0)
Lymphs Abs: 1.4 10*3/uL (ref 0.7–4.0)
MCH: 32.8 pg (ref 26.0–34.0)
MCHC: 34.4 g/dL (ref 30.0–36.0)
MCV: 95.4 fL (ref 78.0–100.0)
Monocytes Absolute: 0.9 10*3/uL (ref 0.1–1.0)
Monocytes Relative: 6 % (ref 3–12)
Neutrophils Relative %: 84 % — ABNORMAL HIGH (ref 43–77)
RBC: 4.79 MIL/uL (ref 3.87–5.11)

## 2012-11-07 LAB — COMPREHENSIVE METABOLIC PANEL
Albumin: 3.5 g/dL (ref 3.5–5.2)
Alkaline Phosphatase: 90 U/L (ref 39–117)
BUN: 12 mg/dL (ref 6–23)
Calcium: 9.6 mg/dL (ref 8.4–10.5)
Creatinine, Ser: 0.47 mg/dL — ABNORMAL LOW (ref 0.50–1.10)
GFR calc Af Amer: >90 mL/min (ref >90)
Glucose, Bld: 286 mg/dL — ABNORMAL HIGH (ref 70–99)
Potassium: 4.3 mEq/L (ref 3.5–5.1)
Total Protein: 8.3 g/dL (ref 6.0–8.3)

## 2012-11-07 MED ORDER — ONDANSETRON HCL 4 MG/2ML IJ SOLN
4.0000 mg | Freq: Once | INTRAMUSCULAR | Status: AC
  Administered 2012-11-07: 4 mg via INTRAVENOUS
  Filled 2012-11-07: qty 2

## 2012-11-07 MED ORDER — INSULIN ASPART 100 UNIT/ML ~~LOC~~ SOLN
0.0000 [IU] | Freq: Three times a day (TID) | SUBCUTANEOUS | Status: DC
Start: 2012-11-07 — End: 2012-11-09
  Administered 2012-11-07: 11 [IU] via SUBCUTANEOUS
  Administered 2012-11-08 (×3): 5 [IU] via SUBCUTANEOUS

## 2012-11-07 MED ORDER — HYDROMORPHONE HCL PF 1 MG/ML IJ SOLN
0.5000 mg | INTRAMUSCULAR | Status: DC | PRN
  Administered 2012-11-07 – 2012-11-08 (×3): 0.5 mg via INTRAVENOUS
  Filled 2012-11-07 (×3): qty 1

## 2012-11-07 MED ORDER — VANCOMYCIN HCL IN DEXTROSE 1-5 GM/200ML-% IV SOLN
1000.0000 mg | Freq: Two times a day (BID) | INTRAVENOUS | Status: DC
  Administered 2012-11-07 – 2012-11-10 (×6): 1000 mg via INTRAVENOUS
  Filled 2012-11-07 (×10): qty 200

## 2012-11-07 MED ORDER — DOCUSATE SODIUM 100 MG PO CAPS
100.0000 mg | ORAL_CAPSULE | Freq: Two times a day (BID) | ORAL | Status: DC
  Administered 2012-11-07 – 2012-11-09 (×5): 100 mg via ORAL
  Filled 2012-11-07 (×5): qty 1

## 2012-11-07 MED ORDER — LINAGLIPTIN 5 MG PO TABS
5.0000 mg | ORAL_TABLET | Freq: Every day | ORAL | Status: DC
  Administered 2012-11-08 – 2012-11-09 (×2): 5 mg via ORAL
  Filled 2012-11-07 (×3): qty 1

## 2012-11-07 MED ORDER — ZOLPIDEM TARTRATE 5 MG PO TABS: 5.0000 mg | ORAL_TABLET | Freq: Every evening | ORAL | Status: DC | PRN

## 2012-11-07 MED ORDER — ACETAMINOPHEN 650 MG RE SUPP: 650.0000 mg | Freq: Four times a day (QID) | RECTAL | Status: DC | PRN

## 2012-11-07 MED ORDER — BISACODYL 10 MG RE SUPP: 10.0000 mg | Freq: Every day | RECTAL | Status: DC | PRN

## 2012-11-07 MED ORDER — ENOXAPARIN SODIUM 40 MG/0.4ML ~~LOC~~ SOLN
40.0000 mg | SUBCUTANEOUS | Status: DC
  Administered 2012-11-07 – 2012-11-10 (×4): 40 mg via SUBCUTANEOUS
  Filled 2012-11-07 (×5): qty 0.4

## 2012-11-07 MED ORDER — ACETAMINOPHEN 325 MG PO TABS: 650.0000 mg | ORAL_TABLET | Freq: Four times a day (QID) | ORAL | Status: DC | PRN

## 2012-11-07 MED ORDER — ASPIRIN EC 81 MG PO TBEC
81.0000 mg | DELAYED_RELEASE_TABLET | Freq: Every day | ORAL | Status: DC
  Administered 2012-11-07 – 2012-11-14 (×8): 81 mg via ORAL
  Filled 2012-11-07 (×8): qty 1

## 2012-11-07 MED ORDER — SODIUM CHLORIDE 0.9 % IV SOLN
INTRAVENOUS | Status: DC
  Administered 2012-11-07 – 2012-11-12 (×5): via INTRAVENOUS

## 2012-11-07 MED ORDER — PIPERACILLIN-TAZOBACTAM 3.375 G IVPB
3.3750 g | Freq: Three times a day (TID) | INTRAVENOUS | Status: DC
  Administered 2012-11-07 – 2012-11-11 (×11): 3.375 g via INTRAVENOUS
  Filled 2012-11-07 (×15): qty 50

## 2012-11-07 MED ORDER — HYDROMORPHONE HCL PF 1 MG/ML IJ SOLN
1.0000 mg | Freq: Once | INTRAMUSCULAR | Status: AC
  Administered 2012-11-07: 1 mg via INTRAVENOUS
  Filled 2012-11-07: qty 1

## 2012-11-07 MED ORDER — METFORMIN HCL 500 MG PO TABS
1000.0000 mg | ORAL_TABLET | Freq: Two times a day (BID) | ORAL | Status: DC
  Administered 2012-11-07 – 2012-11-09 (×5): 1000 mg via ORAL
  Filled 2012-11-07 (×6): qty 2

## 2012-11-07 MED ORDER — HYDROMORPHONE HCL PF 1 MG/ML IJ SOLN
1.0000 mg | INTRAMUSCULAR | Status: DC | PRN
  Filled 2012-11-07: qty 1

## 2012-11-07 MED ORDER — BUDESONIDE-FORMOTEROL FUMARATE 160-4.5 MCG/ACT IN AERO
2.0000 | INHALATION_SPRAY | Freq: Two times a day (BID) | RESPIRATORY_TRACT | Status: DC
  Administered 2012-11-08 – 2012-11-14 (×13): 2 via RESPIRATORY_TRACT
  Filled 2012-11-07: qty 6

## 2012-11-07 MED ORDER — ONDANSETRON HCL 4 MG PO TABS
4.0000 mg | ORAL_TABLET | Freq: Four times a day (QID) | ORAL | Status: DC | PRN
  Administered 2012-11-08 – 2012-11-13 (×4): 4 mg via ORAL
  Filled 2012-11-07 (×5): qty 1

## 2012-11-07 MED ORDER — ALUM & MAG HYDROXIDE-SIMETH 200-200-20 MG/5ML PO SUSP: 30.0000 mL | Freq: Four times a day (QID) | ORAL | Status: DC | PRN

## 2012-11-07 MED ORDER — SITAGLIPTIN PHOS-METFORMIN HCL 50-1000 MG PO TABS: 1.0000 | ORAL_TABLET | Freq: Two times a day (BID) | ORAL | Status: DC

## 2012-11-07 MED ORDER — ONDANSETRON HCL 4 MG/2ML IJ SOLN
4.0000 mg | Freq: Four times a day (QID) | INTRAMUSCULAR | Status: DC | PRN
  Administered 2012-11-10 – 2012-11-13 (×7): 4 mg via INTRAVENOUS
  Filled 2012-11-07 (×7): qty 2

## 2012-11-07 MED ORDER — VANCOMYCIN HCL IN DEXTROSE 1-5 GM/200ML-% IV SOLN
1000.0000 mg | Freq: Once | INTRAVENOUS | Status: AC
  Administered 2012-11-07: 1000 mg via INTRAVENOUS
  Filled 2012-11-07: qty 200

## 2012-11-07 MED ORDER — FLEET ENEMA 7-19 GM/118ML RE ENEM: 1.0000 | ENEMA | Freq: Once | RECTAL | Status: AC | PRN

## 2012-11-07 MED ORDER — LOSARTAN POTASSIUM 50 MG PO TABS
50.0000 mg | ORAL_TABLET | Freq: Every day | ORAL | Status: DC
  Administered 2012-11-07 – 2012-11-11 (×5): 50 mg via ORAL
  Filled 2012-11-07 (×5): qty 1

## 2012-11-07 NOTE — ED Notes (Signed)
Abscess to left side of face x 4 days.  Seen here and given abx  - states is getting worse and has been unable to keep abx down due to vomiting.

## 2012-11-07 NOTE — H&P (Signed)
[facility] History and Physical  DANY HARTEN ZOX:[id number] DOB: [birth year] DOA: 11/07/2012  Referring physician:  PCP: Colette Ribas, MD  Specialists:   Chief Complaint:   HPI: Alexandria Webster is a 47 y.o. female with a past medical history that includes hypertension, diabetes type 2 non-insulin-dependent, asthma, peripheral vascular disease status post stent 10 years ago presents to the emergency department with a chief complaint of left facial abscess. Information is obtained from the patient. She reports that she was here 2 days ago with a complaint of left facial swelling pain and warmth. She believes this started after a spider bite when she was camping at that time. She was evaluated in the emergency department started on Septra DS and advised to apply warm moist compresses which she did. She indicates that the area continued to swell extending up to her I around to her ear and jaw. She states that last evening she became nauseated and had 2 episodes of vomiting. When she awakened this morning she remained nauseated and was unable to eat. In addition she reports that yesterday evening her dog began to lick her face in this area and that it drained a moderate amount of thick liquid. She indicates that the drainage stopped after a short period of time. She denies fever or chills. She denies difficulty chewing or swallowing. He denies headache dizziness syncope or near-syncope. Lab work in the emergency room is significant for a white count of 14.2 sodium of 132 chloride 93 creatinine 0.47 and glucose of 286. Vital signs are significant for a heart rate of 102. Symptoms came on suddenly have persisted and worsened characterized as moderate. We are asked to admit   Review of Systems: The patient denies  fever, weight loss,, vision loss, decreased hearing, hoarseness, chest pain, syncope, dyspnea on exertion, peripheral edema, balance deficits, hemoptysis, abdominal pain, melena,  hematochezia, severe indigestion/heartburn, hematuria, incontinence, genital sores, muscle weakness, suspicious skin lesions, transient blindness, difficulty walking, depression, unusual weight change, abnormal bleeding, enlarged lymph nodes, angioedema, and breast masses.    Past Medical History  Diagnosis Date  . Asthma   . Diabetes mellitus   . Hypertension   . PVD (peripheral vascular disease)    Past Surgical History  Procedure Laterality Date  . Femoral artery stent  right leg  . Back surgery    . Coronary angioplasty with stent placement    . Coronary artery bypass graft     Social History:  reports that she has been smoking Cigarettes.  She has a 15 pack-year smoking history. She has never used smokeless tobacco. She reports that she does not drink alcohol or use illicit drugs. Patient lives with her family is independent with ADLs No Known Allergies  Family History  Problem Relation Age of Onset  . Stroke Mother   . Hypertension Mother   . Heart failure Father      Prior to Admission medications   Medication Sig Start Date End Date Taking? Authorizing Provider  aspirin EC 81 MG tablet Take 81 mg by mouth daily.   Yes Historical Provider, MD  budesonide-formoterol (SYMBICORT) 160-4.5 MCG/ACT inhaler Inhale 2 puffs into the lungs 2 (two) times daily.   Yes Historical Provider, MD  Carboxymethylcellul-Glycerin (CLEAR EYES FOR DRY EYES OP) Place 2 drops into both eyes 2 (two) times daily.   Yes Historical Provider, MD  losartan (COZAAR) 50 MG tablet Take 50 mg by mouth daily.   Yes Historical Provider, MD  oxyCODONE-acetaminophen (PERCOCET) 5-325  MG per tablet Take 2 tablets by mouth every 4 (four) hours as needed for pain. 11/05/12  Yes Donnetta Hutching, MD  sitaGLIPtan-metformin (JANUMET) 50-1000 MG per tablet Take 1 tablet by mouth 2 (two) times daily with a meal.   Yes Historical Provider, MD  sulfamethoxazole-trimethoprim (SEPTRA DS) 800-160 MG per tablet Take 1 tablet by mouth  every 12 (twelve) hours. 11/05/12  Yes Donnetta Hutching, MD   Physical Exam: Filed Vitals:   11/07/12 1037  BP: 137/74  Pulse: 102  Temp: 98.8 F (37.1 C)  Resp: 18     General:  Well-nourished no acute distress  Eyes: PE RRL, EOMI   ENT: Ears clear nose without drainage oropharynx without erythema or exudate. Mucous membranes of her mouth are pink but very dry. Left cheek with swelling, erythema and tenderness. Swelling extends to . periorbital area over to to the ear and around to around jaw.  Neck: Supple no JVD full range of motion no lymphadenopathy  Cardiovascular: Tachycardic but regular no murmur gallop or rub no lower extremity edema  Respiratory: Normal effort breath sounds clear bilaterally to auscultation no wheeze no rhonchi  Abdomen: Soft positive bowel sounds nontender to palpation no mass organomegaly noted  Skin: Warm and dry.  Musculoskeletal: Moves all extremities no clubbing no cyanosis  Psychiatric: Calm cooperative  Neurologic: Nerves II through XII grossly intact speech   Labs on Admission:  Basic Metabolic Panel:  Recent Labs Lab 11/07/12 1003  NA 132*  K 4.3  CL 93*  CO2 23  GLUCOSE 286*  BUN 12  CREATININE 0.47*  CALCIUM 9.6   Liver Function Tests:  Recent Labs Lab 11/07/12 1003  AST 12  ALT 11  ALKPHOS 90  BILITOT 0.4  PROT 8.3  ALBUMIN 3.5   No results found for this basename: LIPASE, AMYLASE,  in the last 168 hours No results found for this basename: AMMONIA,  in the last 168 hours CBC:  Recent Labs Lab 11/07/12 1003  WBC 14.2*  NEUTROABS 11.8*  HGB 15.7*  HCT 45.7  MCV 95.4  PLT 188   Cardiac Enzymes: No results found for this basename: CKTOTAL, CKMB, CKMBINDEX, TROPONINI,  in the last 168 hours  BNP (last 3 results) No results found for this basename: PROBNP,  in the last 8760 hours CBG: No results found for this basename: GLUCAP,  in the last 168 hours  Radiological Exams on Admission: No results  found.  EKG:   Assessment/Plan Principal Problem: Facial cellulitis/abscess: Presumably from a spider bite several days ago during a camping trip several days ago. Has worsened over the last 2 days in spite of oral antibiotics and warm compresses. Will admit to medical floor. Will provide IV vancomycin and Zosyn. Will request a surgical consult since she needs source control. Area may need draining. Provide pain medicine and anti-emetic as needed.  Active Problems:  Nausea/vomiting: Likely related to #1 and setting of hyperglycemia and diabetic patient. Will provide Zofran as needed. Support with IV fluids as patient appears somewhat dry. Monitor electrolytes and address as indicated. Patient is hungry and requesting diet. If unable to tolerate well change to clear liquids.   Hyponatremia: Likely related #2. Will hydrate with IV fluids and recheck in the a.m.    Tachycardia: Likely related to infectious process and dehydration. Will support with IV fluids. Will monitor    Hypertension: Controlled. Will continue her home medications.    Leukocytosis: Related to #1. Will provide IV antibiotics.    DM (  diabetes mellitus): We'll request hemoglobin A1c. Continue her oral agents. Provide car modified diet and sliding scale insulin for optimal control. Anticipate her CBGs running high due to #1.    Asthma: Stable at baseline. Continue her home and inhalers    PVD (peripheral vascular disease): Stable at baseline. Status post stent per Dr. Hart Rochester 10 years ago.      General surgery  Code Status:  Family Communication: None available Disposition Plan: Home when ready likely 2-3 day  Time spent: 65 minutes  Gwenyth Bender [facility] Pager [phone]  If 7PM-7AM, please contact night-coverage [email or web address] Password [facility] 11/07/2012, 12:18 PM      I have seen and examined this patient and I agree with the above assessment and plan.  [name], MD [facility] [phone]

## 2012-11-07 NOTE — ED Provider Notes (Signed)
CSN: [id number]     Arrival date & time 11/07/12  7829 History   First MD Initiated Contact with Patient 11/07/12 [phone]     Chief Complaint  Patient presents with  . Abscess   (Consider location/radiation/quality/duration/timing/severity/associated sxs/prior Treatment) Patient is a 47 y.o. female presenting with abscess. The history is provided by the patient.  Abscess Location:  Face Facial abscess location:  L cheek Abscess quality: painful, redness and warmth   Red streaking: no   Duration:  4 days Progression:  Worsening Pain details:    Quality:  Throbbing and burning   Severity:  Severe   Timing:  Constant   Progression:  Worsening Context: diabetes and insect bite/sting   Relieved by:  Nothing Ineffective treatments:  Warm compresses and oral antibiotics (narcotics) Associated symptoms: anorexia, headaches, nausea and vomiting   Associated symptoms: no fever    GRACIEMAE DELISLE is a 47 y.o. female who presents to the ED for recheck of facial abscess. She was evaluated here 2 days ago for facial swelling and pain that started as a possible spider bite after going on a camping trip. She was started on Septra DS and has been taking the past 2 days but the area has gotten worse. She complains of severe pain. She has been applying warm wet compresses to the area and taking oxycodone for pain without relief.   Past Medical History  Diagnosis Date  . Asthma   . Diabetes mellitus   . Hypertension    Past Surgical History  Procedure Laterality Date  . Femoral artery stent  right leg  . Back surgery    . Coronary angioplasty with stent placement    . Coronary artery bypass graft     Family History  Problem Relation Age of Onset  . Stroke Mother   . Hypertension Mother   . Heart failure Father    History  Substance Use Topics  . Smoking status: Current Every Day Smoker -- 0.50 packs/day for 30 years    Types: Cigarettes  . Smokeless tobacco: Never Used  . Alcohol Use:  No   OB History   Grav Para Term Preterm Abortions TAB SAB Ect Mult Living   2 1  1 1  1   1      Review of Systems  Constitutional: Negative for fever and chills.  HENT: Negative for neck pain.   Eyes: Positive for pain (left). Negative for visual disturbance.  Gastrointestinal: Positive for nausea, vomiting and anorexia.  Skin: Positive for wound.  Neurological: Positive for headaches.  Psychiatric/Behavioral: The patient is not nervous/anxious.     Allergies  Review of patient's allergies indicates no known allergies.  Home Medications   Current Outpatient Rx  Name  Route  Sig  Dispense  Refill  . aspirin EC 81 MG tablet   Oral   Take 81 mg by mouth daily.         . budesonide-formoterol (SYMBICORT) 160-4.5 MCG/ACT inhaler   Inhalation   Inhale 2 puffs into the lungs 2 (two) times daily.         . Carboxymethylcellul-Glycerin (CLEAR EYES FOR DRY EYES OP)   Both Eyes   Place 2 drops into both eyes 2 (two) times daily.         Marland Kitchen losartan (COZAAR) 50 MG tablet   Oral   Take 50 mg by mouth daily.         Marland Kitchen oxyCODONE-acetaminophen (PERCOCET) 5-325 MG per tablet   Oral  Take 2 tablets by mouth every 4 (four) hours as needed for pain.   20 tablet   0   . sitaGLIPtan-metformin (JANUMET) 50-1000 MG per tablet   Oral   Take 1 tablet by mouth 2 (two) times daily with a meal.         . sulfamethoxazole-trimethoprim (SEPTRA DS) 800-160 MG per tablet   Oral   Take 1 tablet by mouth every 12 (twelve) hours.   20 tablet   0    BP 158/81  Pulse 118  Temp(Src) 97.5 F (36.4 C) (Oral)  Resp 16  Ht 5\' 4"  (1.626 m)  Wt 164 lb (74.39 kg)  BMI 28.14 kg/m2  SpO2 98% Physical Exam  Nursing note and vitals reviewed. Constitutional: She is oriented to person, place, and time. She appears well-developed and well-nourished. No distress.  HENT:  Head: Head is with left periorbital erythema.    Mouth/Throat: Uvula is midline, oropharynx is clear and moist and  mucous membranes are normal.  Swelling, erythema and tenderness left side of face.   Eyes: EOM are normal.  Neck: Neck supple.  Cardiovascular: Normal rate and regular rhythm.   Pulmonary/Chest: Effort normal and breath sounds normal.  Abdominal: Soft. Bowel sounds are normal. There is no tenderness.  Musculoskeletal: Normal range of motion.  Neurological: She is alert and oriented to person, place, and time. No cranial nerve deficit.  Skin:  Increased warmth and erythema left cheek  Psychiatric: She has a normal mood and affect. Her behavior is normal.    ED Course  Procedures Results for orders placed during the hospital encounter of 11/07/12 (from the past 24 hour(s))  CBC WITH DIFFERENTIAL     Status: Abnormal   Collection Time    11/07/12 10:03 AM      Result Value Range   WBC 14.2 (*) 4.0 - 10.5 K/uL   RBC 4.79  3.87 - 5.11 MIL/uL   Hemoglobin 15.7 (*) 12.0 - 15.0 g/dL   HCT 16.1  09.6 - 04.5 %   MCV 95.4  78.0 - 100.0 fL   MCH 32.8  26.0 - 34.0 pg   MCHC 34.4  30.0 - 36.0 g/dL   RDW 40.9  81.1 - 91.4 %   Platelets 188  150 - 400 K/uL   Neutrophils Relative % 84 (*) 43 - 77 %   Neutro Abs 11.8 (*) 1.7 - 7.7 K/uL   Lymphocytes Relative 10 (*) 12 - 46 %   Lymphs Abs 1.4  0.7 - 4.0 K/uL   Monocytes Relative 6  3 - 12 %   Monocytes Absolute 0.9  0.1 - 1.0 K/uL   Eosinophils Relative 1  0 - 5 %   Eosinophils Absolute 0.1  0.0 - 0.7 K/uL   Basophils Relative 0  0 - 1 %   Basophils Absolute 0.0  0.0 - 0.1 K/uL  COMPREHENSIVE METABOLIC PANEL     Status: Abnormal   Collection Time    11/07/12 10:03 AM      Result Value Range   Sodium 132 (*) 135 - 145 mEq/L   Potassium 4.3  3.5 - 5.1 mEq/L   Chloride 93 (*) 96 - 112 mEq/L   CO2 23  19 - 32 mEq/L   Glucose, Bld 286 (*) 70 - 99 mg/dL   BUN 12  6 - 23 mg/dL   Creatinine, Ser 7.82 (*) 0.50 - 1.10 mg/dL   Calcium 9.6  8.4 - 95.6 mg/dL   Total Protein  8.3  6.0 - 8.3 g/dL   Albumin 3.5  3.5 - 5.2 g/dL   AST 12  0 - 37 U/L     ALT 11  0 - 35 U/L   Alkaline Phosphatase 90  39 - 117 U/L   Total Bilirubin 0.4  0.3 - 1.2 mg/dL   GFR calc non Af Amer >90  >90 mL/min   GFR calc Af Amer >90  >90 mL/min    IV, Labs, Zofran and Vancomycin started.   Dr. Adriana Simas in to see the patient. MDM  47 y.o. female with facial abscess and cellulitis she is a diabetic and her glucose today is elevated. I spoke with Dr. Elvera Lennox and he will admit the patient. Temporary admission orders written.     [facility] Orlene Och, NP 11/07/12 1140

## 2012-11-07 NOTE — Progress Notes (Signed)
ANTIBIOTIC CONSULT NOTE - INITIAL  Pharmacy Consult for Vancomycin & Zosyn Indication: cellulitis  No Known Allergies  Patient Measurements: Height: 5\' 4"  (162.6 cm) Weight: 156 lb 1.4 oz (70.8 kg) IBW/kg (Calculated) : 54.7  Vital Signs: Temp: 96.7 F (35.9 C) (08/27 1339) Temp src: Axillary (08/27 1339) BP: 152/71 mmHg (08/27 1339) Pulse Rate: 94 (08/27 1339) Intake/Output from previous day:   Intake/Output from this shift: Total I/O In: 120 [P.O.:120] Out: -   Labs:  Recent Labs  11/07/12 1003  WBC 14.2*  HGB 15.7*  PLT 188  CREATININE 0.47*   Estimated Creatinine Clearance: 83 ml/min (by C-G formula based on Cr of 0.47). No results found for this basename: VANCOTROUGH, VANCOPEAK, VANCORANDOM, GENTTROUGH, GENTPEAK, GENTRANDOM, TOBRATROUGH, TOBRAPEAK, TOBRARND, AMIKACINPEAK, AMIKACINTROU, AMIKACIN,  in the last 72 hours   Microbiology: No results found for this or any previous visit (from the past 720 hour(s)).  Medical History: Past Medical History  Diagnosis Date  . Asthma   . Diabetes mellitus   . Hypertension   . PVD (peripheral vascular disease)     Medications:  Scheduled:  . aspirin EC  81 mg Oral Daily  . budesonide-formoterol  2 puff Inhalation BID  . docusate sodium  100 mg Oral BID  . enoxaparin (LOVENOX) injection  40 mg Subcutaneous Q24H  . insulin aspart  0-15 Units Subcutaneous TID WC  . metFORMIN  1,000 mg Oral BID WC   And  . [START ON 11/08/2012] linagliptin  5 mg Oral Q breakfast  . losartan  50 mg Oral Daily   Assessment: 47 yo F with facial cellulitis after spider bite that has worsened on oral Bactrim/ warm compresses as an outpatient.  Renal function is at patient's baseline.  Vancomycin 8/27>> Zosyn 8/27>>  Goal of Therapy:  Vancomycin trough level 10-15 mcg/ml  Plan:  1) Zosyn 3.375gm IV Q8h to be infused over 4hrs 2) Vancomycin 1gm IV q12h 3) Check Vancomycin trough at steady state 4) Monitor renal function and cx  data   Elson Clan 11/07/2012,3:33 PM

## 2012-11-07 NOTE — ED Provider Notes (Signed)
Medical screening examination/treatment/procedure(s) were conducted as a shared visit with non-physician practitioner(s) and myself.  I personally evaluated the patient during the encounter.  Patient is diabetic and has failed outpatient treatment for facial cellulitis.  Admit to general medicine.  Donnetta Hutching, MD 11/07/12 1356

## 2012-11-07 NOTE — Progress Notes (Signed)
UR Chart Review Completed  

## 2012-11-08 LAB — BASIC METABOLIC PANEL
Chloride: 98 mEq/L (ref 96–112)
GFR calc Af Amer: >90 mL/min (ref >90)
GFR calc non Af Amer: >90 mL/min (ref >90)
Glucose, Bld: 255 mg/dL — ABNORMAL HIGH (ref 70–99)
Potassium: 3.5 mEq/L (ref 3.5–5.1)
Sodium: 134 mEq/L — ABNORMAL LOW (ref 135–145)

## 2012-11-08 LAB — HEMOGLOBIN A1C: Hgb A1c MFr Bld: 12.5 % — ABNORMAL HIGH (ref <5.7)

## 2012-11-08 LAB — GLUCOSE, CAPILLARY

## 2012-11-08 LAB — CBC
HCT: 40 % (ref 36.0–46.0)
Hemoglobin: 14 g/dL (ref 12.0–15.0)
RBC: 4.2 MIL/uL (ref 3.87–5.11)

## 2012-11-08 MED ORDER — LIDOCAINE HCL (PF) 1 % IJ SOLN
INTRAMUSCULAR | Status: AC
  Administered 2012-11-08: 14:00:00
  Filled 2012-11-08: qty 5

## 2012-11-08 MED ORDER — HYDROMORPHONE HCL PF 1 MG/ML IJ SOLN
0.5000 mg | Freq: Once | INTRAMUSCULAR | Status: AC
  Administered 2012-11-08: 0.5 mg via INTRAMUSCULAR

## 2012-11-08 MED ORDER — POLYETHYLENE GLYCOL 3350 17 G PO PACK
17.0000 g | PACK | Freq: Every day | ORAL | Status: DC
  Administered 2012-11-08 – 2012-11-13 (×4): 17 g via ORAL
  Filled 2012-11-08 (×6): qty 1

## 2012-11-08 MED ORDER — OXYCODONE-ACETAMINOPHEN 5-325 MG PO TABS
2.0000 | ORAL_TABLET | ORAL | Status: DC | PRN
  Administered 2012-11-09 – 2012-11-13 (×6): 2 via ORAL
  Filled 2012-11-08 (×6): qty 2

## 2012-11-08 NOTE — Progress Notes (Signed)
[facility] PROGRESS NOTE  Alexandria Webster ZOX:[id number] DOB: 1965-10-27 DOA: 11/07/2012 PCP: Colette Ribas, MD  Assessment/Plan: Facial cellulitis/abscess: Presumably from a spider bite several days ago during a camping trip.  Swelling and erythema somewhat worse this morning. Mild induration. Continue IV vancomycin and Zosyn day#2.  S/p I&D by surgery, microbiology pending.    Nausea/vomiting: Likely related to #1 and setting of hyperglycemia and diabetic patient. Improved. Able to tolerate diet. Will decrease  IV fluids.   Hyponatremia: Likely related #2. Improving. Continue IV fluids at lower rate. Recheck in am.    Tachycardia: Likely related to infectious process and dehydration. Resolved this am. Monitor   Hypertension: Controlled. Will continue her home medications.   Leukocytosis: Related to #1. Trending downward. Max temp 99.1. Non-toxic appearing.    DM (diabetes mellitus): uncontrolled. A1c 12.5. CBG range 210-233. May be somewhat related to infection.  Continue her oral agents. Continue carb modified diet and sliding scale insulin for optimal control. Request diabetic coordinator consult.  Asthma: Stable at baseline. Continue her home and inhalers   PVD (peripheral vascular disease): Stable at baseline. Status post stent per Dr. Hart Rochester 10 years ago.   Code Status: full Family Communication: none present Disposition Plan: home when ready. Hopefully 48 hours  Consultants:  General surgery  Procedures:  none  Antibiotics:  Vancomycin 11/07/12>>>>  Zosyn 11/07/12>>>  HPI/Subjective: Reports feeling better with less nausea. Continues with pain left face.  Objective: Filed Vitals:   11/08/12 0539  BP: 121/66  Pulse: 87  Temp: 98.6 F (37 C)  Resp: 18    Intake/Output Summary (Last 24 hours) at 11/08/12 0851 Last data filed at 11/08/12 0540  Gross per 24 hour  Intake 1878.75 ml  Output   1150 ml  Net 728.75 ml   Filed Weights   11/07/12  0849 11/07/12 1339  Weight: 74.39 kg (164 lb) 70.8 kg (156 lb 1.4 oz)    Exam:   General:  Well nourished NAD  Cardiovascular: RRR No LE edema no MGR  Respiratory: normal efffot BS clear to ausculation bilaterally  Abdomen: obese soft +BS non-tender to palpation  Musculoskeletal: no clubbing no cyanosis   Skin: left face with swelling, erythema, mild induration. Erythema from distal cheek to base of nose, swelling extends up to left eye but does not include eyelids over to under left ear to left jaw line. Center of area with small whitish circle, very hard and very tender to touch.    Data Reviewed: Basic Metabolic Panel:  Recent Labs Lab 11/07/12 1003 11/08/12 0543  NA 132* 134*  K 4.3 3.5  CL 93* 98  CO2 23 24  GLUCOSE 286* 255*  BUN 12 12  CREATININE 0.47* 0.53  CALCIUM 9.6 9.4   Liver Function Tests:  Recent Labs Lab 11/07/12 1003  AST 12  ALT 11  ALKPHOS 90  BILITOT 0.4  PROT 8.3  ALBUMIN 3.5   CBC:  Recent Labs Lab 11/07/12 1003 11/08/12 0543  WBC 14.2* 12.1*  NEUTROABS 11.8*  --   HGB 15.7* 14.0  HCT 45.7 40.0  MCV 95.4 95.2  PLT 188 203   CBG:  Recent Labs Lab 11/07/12 1656 11/07/12 2123 11/08/12 0728  GLUCAP 321* 233* 210*   Studies: No results found.  Scheduled Meds: . aspirin EC  81 mg Oral Daily  . budesonide-formoterol  2 puff Inhalation BID  . docusate sodium  100 mg Oral BID  . enoxaparin (LOVENOX) injection  40 mg Subcutaneous Q24H  .  insulin aspart  0-15 Units Subcutaneous TID WC  . metFORMIN  1,000 mg Oral BID WC   And  . linagliptin  5 mg Oral Q breakfast  . losartan  50 mg Oral Daily  . piperacillin-tazobactam (ZOSYN)  IV  3.375 g Intravenous Q8H  . vancomycin  1,000 mg Intravenous Q12H   Continuous Infusions: . sodium chloride Stopped (11/08/12 0846)   Principal Problem:   Facial cellulitis Active Problems:   Hypertension   Leukocytosis   DM (diabetes mellitus)   Asthma   PVD (peripheral vascular  disease)   Hyponatremia   Tachycardia   Nausea & vomiting  Time spent: 30 minutes  [facility]  [facility] Pager [phone]. If 7PM-7AM, please contact night-coverage at [email or web address], password [facility] 11/08/2012, 8:51 AM  LOS: 1 day

## 2012-11-08 NOTE — Progress Notes (Addendum)
Inpatient Diabetes Program Recommendations  AACE/ADA: New Consensus Statement on Inpatient Glycemic Control (2013)  Target Ranges:  Prepandial:   less than 140 mg/dL      Peak postprandial:   less than 180 mg/dL (1-2 hours)      Critically ill patients:  140 - 180 mg/dL  Results for Alexandria Webster, Alexandria Webster (MRN [id number]) as of 11/08/2012 10:22  Ref. Range 11/07/2012 09:58 11/07/2012 10:03 11/08/2012 05:43  Hemoglobin A1C Latest Range: <5.7 % 12.5 (H)    Glucose Latest Range: 70-99 mg/dL  528 (H) 413 (H)   Results for Alexandria Webster, Alexandria Webster (MRN [id number]) as of 11/08/2012 10:22  Ref. Range 11/07/2012 16:56 11/07/2012 21:23 11/08/2012 07:28  Glucose-Capillary Latest Range: 70-99 mg/dL 536 (H) 644 (H) 034 (H)    Inpatient Diabetes Program Recommendations Correction (SSI): Please consider increasing Novolog correction to Resistant scale and adding bedtime correction.  Note:  Patient has a history of diabetes and takes Janumet 50-1000 mg BID at home for diabetes management.  Currently, patient is ordered to receive Novolog 0-15 units AC, Trandjenta 5 mg QAM, and Metformin 1000 mg BID for inpatient glycemic control.  Blood glucose has ranged from 210-321 mg/dl over the past 24 hours.  Please consider increasing Novolog correction to Resistant scale and add bedtime correction to improve inpatient glycemic control.  Spoke with pt about diabetes and home regimen for diabetes control. Discussed A1C results (12.5% on 11/07/12) with her and explained what an A1C is, basic pathophysiology of DM Type 2, basic home care, importance of checking CBGs and maintaining good CBG control to prevent long-term and short-term complications. Patient reports that she checks her blood glucose 3-4 times a day and the readings are usually less than 200 mg/dl.  She was not aware of what an A1C was and she states she does not remember ever being told what her A1C was.  Dr. Phillips Odor is her PCP and he changed her from Metformin BID to Janumet  50-1000 mg BID about 2-3 months ago.  Dr. Phillips Odor also referred her to Dr. Fransico Him for help with diabetes management.  She reports that she saw Dr. Fransico Him about one month ago and he did not make any changes in her current regimen for diabetes control.  She is to follow up with him in a couple of months.  Also discussed nutrition with the patient and she reports that she follows a diabetic diet, stays away from sweets, and only drinks diet and/or sugar free fluids.  Patient appears to have an understanding of diabetes and she reports being compliant with medications and diet.  She verbalized understanding of information discussed and states that she has no further questions at this time.  Will continue to follow.  Thanks, Orlando Penner, RN, MSN, CCRN Diabetes Coordinator Inpatient Diabetes Program [phone]

## 2012-11-08 NOTE — Care Management Note (Signed)
    Page 1 of 1   11/14/2012     10:43:59 AM   CARE MANAGEMENT NOTE 11/14/2012  Patient:  Alexandria Webster, Alexandria Webster   Account Number:  [id number]  Date Initiated:  11/08/2012  Documentation initiated by:  Sharrie Rothman  Subjective/Objective Assessment:   Pt admitted from home with facial cellulitis. Pt lives with her boyfriend and children. Pt is independent with ADL's.     Action/Plan:   CM spoke with pt about remote need for IV AB at discharge. Pt is aware of process and need for PICC line. Pt would like to do IV AB at home if needed. Will continue to follow for discharge planning needs.   Anticipated DC Date:  11/11/2012   Anticipated DC Plan:  HOME W HOME HEALTH SERVICES      DC Planning Services  CM consult      Choice offered to / List presented to:             Status of service:  Completed, signed off Medicare Important Message given?   (If response is "NO", the following Medicare IM given date fields will be blank) Date Medicare IM given:   Date Additional Medicare IM given:    Discharge Disposition:  HOME/SELF CARE  Per UR Regulation:    If discussed at Long Length of Stay Meetings, dates discussed:    Comments:  11/14/12 1040 Arlyss Queen, RN BSN CM Pt discharged today. No CM needs noted.  11/08/12 1333 Arlyss Queen, RN BSN CM

## 2012-11-08 NOTE — Progress Notes (Signed)
Nutrition Brief Note  Patient identified on the Malnutrition Screening Tool (MST) Report  Patient Active Problem List   Diagnosis Date Noted  . Facial cellulitis 11/07/2012  . Leukocytosis 11/07/2012  . DM (diabetes mellitus) 11/07/2012  . Asthma 11/07/2012  . PVD (peripheral vascular disease) 11/07/2012  . Hyponatremia 11/07/2012  . Tachycardia 11/07/2012  . Nausea & vomiting 11/07/2012  . Hypertension     Body mass index is 26.78 kg/(m^2). Patient meets criteria for overweight  based on current BMI.   Pt N/V improved, diet advanced and she is tolerating. Current diet order is CHO Modified (1600-2000 kcal) , patient is consuming  approximately 50% of meals at this time. Labs and medications reviewed.   No nutrition interventions warranted at this time. If nutrition issues arise, please consult RD.   Royann Shivers MS,RD,LDN,CSG Office: [phone] Pager: [phone]

## 2012-11-08 NOTE — Procedures (Signed)
Incision and Drainage Procedure Note  Pre-operative Diagnosis: Left buccal abscess  Post-operative Diagnosis: same  Indications: Patient presented to [facility] with several day history of increasing swelling in the left base. A function area is noted under an eschar. Surgical options were discussed. Patient is agreeable to proceeding with incision and drainage  Anesthesia: 1% plain lidocaine  Procedure Details  The procedure, risks and complications have been discussed in detail (including, but not limited to airway compromise, infection, bleeding) with the patient, and the patient has signed consent to the procedure.  The skin was sterilely prepped and draped over the affected area in the usual fashion. After adequate local anesthesia, I&D with a #11 blade was performed on the left buccal region. Purulent drainage: present The patient was observed until stable.  Findings: Copious amount of purulent discharge encountered  EBL: Less than 20 mL's  Drains: None  Condition: Tolerated procedure well and Stable   Complications: none.

## 2012-11-08 NOTE — Consult Note (Signed)
Reason for Consult: Left facial pain Referring Physician: [facility]  Alexandria Webster is an 47 y.o. female.  HPI: Patient presented to [facility] several days of increasing swelling in the left face. She states she been on a camping trip and had woken up with pain on that side. Swelling continued to increase. She denies any drainage. She has not done any warm compresses or local treatment to date. She has had some subjective fevers and chills. She has been tolerating a diet. No unusual exposures or travel.  No recent dental work  Past Medical History  Diagnosis Date  . Asthma   . Diabetes mellitus   . Hypertension   . PVD (peripheral vascular disease)     Past Surgical History  Procedure Laterality Date  . Femoral artery stent  right leg  . Back surgery    . Coronary angioplasty with stent placement    . Coronary artery bypass graft      Family History  Problem Relation Age of Onset  . Stroke Mother   . Hypertension Mother   . Heart failure Father     Social History:  reports that she has been smoking Cigarettes.  She has a 15 pack-year smoking history. She has never used smokeless tobacco. She reports that she does not drink alcohol or use illicit drugs.  Allergies: No Known Allergies  Medications:  I have reviewed the patient's current medications. Prior to Admission:  Prescriptions prior to admission  Medication Sig Dispense Refill  . aspirin EC 81 MG tablet Take 81 mg by mouth daily.      . budesonide-formoterol (SYMBICORT) 160-4.5 MCG/ACT inhaler Inhale 2 puffs into the lungs 2 (two) times daily.      . Carboxymethylcellul-Glycerin (CLEAR EYES FOR DRY EYES OP) Place 2 drops into both eyes 2 (two) times daily.      Marland Kitchen losartan (COZAAR) 50 MG tablet Take 50 mg by mouth daily.      Marland Kitchen oxyCODONE-acetaminophen (PERCOCET) 5-325 MG per tablet Take 2 tablets by mouth every 4 (four) hours as needed for pain.  20 tablet  0  . sitaGLIPtan-metformin (JANUMET)  50-1000 MG per tablet Take 1 tablet by mouth 2 (two) times daily with a meal.      . sulfamethoxazole-trimethoprim (SEPTRA DS) 800-160 MG per tablet Take 1 tablet by mouth every 12 (twelve) hours.  20 tablet  0   Scheduled: . aspirin EC  81 mg Oral Daily  . budesonide-formoterol  2 puff Inhalation BID  . docusate sodium  100 mg Oral BID  . enoxaparin (LOVENOX) injection  40 mg Subcutaneous Q24H  . insulin aspart  0-15 Units Subcutaneous TID WC  . lidocaine (PF)      . metFORMIN  1,000 mg Oral BID WC   And  . linagliptin  5 mg Oral Q breakfast  . losartan  50 mg Oral Daily  . piperacillin-tazobactam (ZOSYN)  IV  3.375 g Intravenous Q8H  . vancomycin  1,000 mg Intravenous Q12H   Continuous: . sodium chloride Stopped (11/08/12 0846)   RUE:[id number], acetaminophen, alum & mag hydroxide-simeth, bisacodyl, HYDROmorphone (DILAUDID) injection, ondansetron (ZOFRAN) IV, ondansetron, oxyCODONE-acetaminophen, zolpidem Anti-infectives   Start     Dose/Rate Route Frequency Ordered Stop   11/07/12 2200  vancomycin (VANCOCIN) IVPB 1000 mg/200 mL premix     1,000 mg 200 mL/hr over 60 Minutes Intravenous Every 12 hours 11/07/12 1536     11/07/12 1600  piperacillin-tazobactam (ZOSYN) IVPB 3.375 g  3.375 g 12.5 mL/hr over 240 Minutes Intravenous Every 8 hours 11/07/12 1536     11/07/12 0930  vancomycin (VANCOCIN) IVPB 1000 mg/200 mL premix     1,000 mg 200 mL/hr over 60 Minutes Intravenous  Once 11/07/12 1610 11/07/12 1131      Results for orders placed during the hospital encounter of 11/07/12 (from the past 48 hour(s))  HEMOGLOBIN A1C     Status: Abnormal   Collection Time    11/07/12  9:58 AM      Result Value Range   Hemoglobin A1C 12.5 (*) <5.7 %   Comment: (NOTE)                                                                               According to the ADA Clinical Practice Recommendations for 2011, when     HbA1c is used as a screening test:      >=6.5%   Diagnostic of  Diabetes Mellitus               (if abnormal result is confirmed)     5.7-6.4%   Increased risk of developing Diabetes Mellitus     References:Diagnosis and Classification of Diabetes Mellitus,Diabetes     Care,2011,34(Suppl 1):S62-S69 and Standards of Medical Care in             Diabetes - 2011,Diabetes Care,2011,34 (Suppl 1):S11-S61.   Mean Plasma Glucose 312 (*) <117 mg/dL   Comment: Performed at [facility]  CBC WITH DIFFERENTIAL     Status: Abnormal   Collection Time    11/07/12 10:03 AM      Result Value Range   WBC 14.2 (*) 4.0 - 10.5 K/uL   RBC 4.79  3.87 - 5.11 MIL/uL   Hemoglobin 15.7 (*) 12.0 - 15.0 g/dL   HCT 96.0  45.4 - 09.8 %   MCV 95.4  78.0 - 100.0 fL   MCH 32.8  26.0 - 34.0 pg   MCHC 34.4  30.0 - 36.0 g/dL   RDW 11.9  14.7 - 82.9 %   Platelets 188  150 - 400 K/uL   Neutrophils Relative % 84 (*) 43 - 77 %   Neutro Abs 11.8 (*) 1.7 - 7.7 K/uL   Lymphocytes Relative 10 (*) 12 - 46 %   Lymphs Abs 1.4  0.7 - 4.0 K/uL   Monocytes Relative 6  3 - 12 %   Monocytes Absolute 0.9  0.1 - 1.0 K/uL   Eosinophils Relative 1  0 - 5 %   Eosinophils Absolute 0.1  0.0 - 0.7 K/uL   Basophils Relative 0  0 - 1 %   Basophils Absolute 0.0  0.0 - 0.1 K/uL  COMPREHENSIVE METABOLIC PANEL     Status: Abnormal   Collection Time    11/07/12 10:03 AM      Result Value Range   Sodium 132 (*) 135 - 145 mEq/L   Potassium 4.3  3.5 - 5.1 mEq/L   Chloride 93 (*) 96 - 112 mEq/L   CO2 23  19 - 32 mEq/L   Glucose, Bld 286 (*) 70 - 99 mg/dL   BUN 12  6 - 23 mg/dL  Creatinine, Ser 0.47 (*) 0.50 - 1.10 mg/dL   Calcium 9.6  8.4 - 16.1 mg/dL   Total Protein 8.3  6.0 - 8.3 g/dL   Albumin 3.5  3.5 - 5.2 g/dL   AST 12  0 - 37 U/L   ALT 11  0 - 35 U/L   Alkaline Phosphatase 90  39 - 117 U/L   Total Bilirubin 0.4  0.3 - 1.2 mg/dL   GFR calc non Af Amer >90  >90 mL/min   GFR calc Af Amer >90  >90 mL/min   Comment: (NOTE)     The eGFR has been calculated using the CKD EPI equation.      This calculation has not been validated in all clinical situations.     eGFR's persistently <90 mL/min signify possible Chronic Kidney     Disease.  GLUCOSE, CAPILLARY     Status: Abnormal   Collection Time    11/07/12  4:56 PM      Result Value Range   Glucose-Capillary 321 (*) 70 - 99 mg/dL   Comment 1 Notify RN     Comment 2 Documented in Chart    GLUCOSE, CAPILLARY     Status: Abnormal   Collection Time    11/07/12  9:23 PM      Result Value Range   Glucose-Capillary 233 (*) 70 - 99 mg/dL   Comment 1 Notify RN     Comment 2 Documented in Chart    BASIC METABOLIC PANEL     Status: Abnormal   Collection Time    11/08/12  5:43 AM      Result Value Range   Sodium 134 (*) 135 - 145 mEq/L   Potassium 3.5  3.5 - 5.1 mEq/L   Comment: DELTA CHECK NOTED   Chloride 98  96 - 112 mEq/L   CO2 24  19 - 32 mEq/L   Glucose, Bld 255 (*) 70 - 99 mg/dL   BUN 12  6 - 23 mg/dL   Creatinine, Ser 0.96  0.50 - 1.10 mg/dL   Calcium 9.4  8.4 - 04.5 mg/dL   GFR calc non Af Amer >90  >90 mL/min   GFR calc Af Amer >90  >90 mL/min   Comment: (NOTE)     The eGFR has been calculated using the CKD EPI equation.     This calculation has not been validated in all clinical situations.     eGFR's persistently <90 mL/min signify possible Chronic Kidney     Disease.  CBC     Status: Abnormal   Collection Time    11/08/12  5:43 AM      Result Value Range   WBC 12.1 (*) 4.0 - 10.5 K/uL   RBC 4.20  3.87 - 5.11 MIL/uL   Hemoglobin 14.0  12.0 - 15.0 g/dL   HCT 40.9  81.1 - 91.4 %   MCV 95.2  78.0 - 100.0 fL   MCH 33.3  26.0 - 34.0 pg   MCHC 35.0  30.0 - 36.0 g/dL   RDW 78.2  95.6 - 21.3 %   Platelets 203  150 - 400 K/uL  GLUCOSE, CAPILLARY     Status: Abnormal   Collection Time    11/08/12  7:28 AM      Result Value Range   Glucose-Capillary 210 (*) 70 - 99 mg/dL   Comment 1 Notify RN    GLUCOSE, CAPILLARY     Status: Abnormal   Collection Time  11/08/12 11:37 AM      Result Value Range    Glucose-Capillary 216 (*) 70 - 99 mg/dL   Comment 1 Notify RN      No results found.  Review of Systems  Constitutional: Positive for fever and chills.  HENT:       As per history of present illness  Eyes: Negative.   Respiratory: Negative.   Cardiovascular: Negative.   Gastrointestinal: Negative.   Genitourinary: Negative.   Musculoskeletal: Negative.   Skin:       As per history of present illness  Neurological: Negative.   Endo/Heme/Allergies: Negative.   Psychiatric/Behavioral: Negative.    Blood pressure 121/66, pulse 87, temperature 98.6 F (37 C), temperature source Oral, resp. rate 18, height 5\' 4"  (1.626 m), weight 70.8 kg (156 lb 1.4 oz), SpO2 97.00%. Physical Exam  Constitutional: She is oriented to person, place, and time. She appears well-developed and well-nourished. No distress.  HENT:  Head: Normocephalic and atraumatic.    Eyes: Conjunctivae are normal. Pupils are equal, round, and reactive to light. No scleral icterus.  Neck: Normal range of motion. No tracheal deviation present.  Cardiovascular: Normal rate and regular rhythm.   Respiratory: Effort normal and breath sounds normal.  GI: Soft. Bowel sounds are normal. There is no tenderness.  Lymphadenopathy:    She has no cervical adenopathy.  Neurological: She is alert and oriented to person, place, and time.  Skin: Skin is warm and dry.    Assessment/Plan: Left buccal abscess.  Patient is currently on IV antibiotics. Oral compresses have been initiated by the nursing staff. Options have been discussed with the patient. Patient does wish to proceed with incision and drainage.  Lianah Peed C 11/08/2012, 12:42 PM

## 2012-11-09 DIAGNOSIS — K5900 Constipation, unspecified: Secondary | ICD-10-CM | POA: Diagnosis present

## 2012-11-09 DIAGNOSIS — R112 Nausea with vomiting, unspecified: Secondary | ICD-10-CM

## 2012-11-09 LAB — BASIC METABOLIC PANEL
BUN: 13 mg/dL (ref 6–23)
CO2: 27 mEq/L (ref 19–32)
Chloride: 100 mEq/L (ref 96–112)
Creatinine, Ser: 0.88 mg/dL (ref 0.50–1.10)
Glucose, Bld: 305 mg/dL — ABNORMAL HIGH (ref 70–99)
Potassium: 3.6 mEq/L (ref 3.5–5.1)

## 2012-11-09 LAB — CBC
HCT: 40.4 % (ref 36.0–46.0)
Hemoglobin: 14 g/dL (ref 12.0–15.0)
MCH: 33.2 pg (ref 26.0–34.0)
MCHC: 34.7 g/dL (ref 30.0–36.0)
MCV: 95.7 fL (ref 78.0–100.0)

## 2012-11-09 LAB — GLUCOSE, CAPILLARY
Glucose-Capillary: 205 mg/dL — ABNORMAL HIGH (ref 70–99)
Glucose-Capillary: 112 mg/dL — ABNORMAL HIGH (ref 70–99)
Glucose-Capillary: 197 mg/dL — ABNORMAL HIGH (ref 70–99)

## 2012-11-09 LAB — VANCOMYCIN, TROUGH: Vancomycin Tr: 15.2 ug/mL (ref 10.0–20.0)

## 2012-11-09 MED ORDER — INSULIN ASPART 100 UNIT/ML ~~LOC~~ SOLN
0.0000 [IU] | Freq: Every day | SUBCUTANEOUS | Status: DC
  Administered 2012-11-11 – 2012-11-13 (×2): 2 [IU] via SUBCUTANEOUS

## 2012-11-09 MED ORDER — INSULIN ASPART 100 UNIT/ML ~~LOC~~ SOLN
0.0000 [IU] | Freq: Three times a day (TID) | SUBCUTANEOUS | Status: DC
  Administered 2012-11-09: 7 [IU] via SUBCUTANEOUS
  Administered 2012-11-09: 11 [IU] via SUBCUTANEOUS
  Administered 2012-11-10: 4 [IU] via SUBCUTANEOUS
  Administered 2012-11-10 (×2): 7 [IU] via SUBCUTANEOUS
  Administered 2012-11-11: 4 [IU] via SUBCUTANEOUS
  Administered 2012-11-11: 3 [IU] via SUBCUTANEOUS
  Administered 2012-11-12: 7 [IU] via SUBCUTANEOUS
  Administered 2012-11-12: 15 [IU] via SUBCUTANEOUS
  Administered 2012-11-12: 7 [IU] via SUBCUTANEOUS
  Administered 2012-11-13: 15 [IU] via SUBCUTANEOUS
  Administered 2012-11-13: 11 [IU] via SUBCUTANEOUS
  Administered 2012-11-14: 4 [IU] via SUBCUTANEOUS

## 2012-11-09 MED ORDER — SENNOSIDES-DOCUSATE SODIUM 8.6-50 MG PO TABS
2.0000 | ORAL_TABLET | Freq: Two times a day (BID) | ORAL | Status: DC
  Administered 2012-11-09 – 2012-11-14 (×11): 2 via ORAL
  Filled 2012-11-09 (×11): qty 2

## 2012-11-09 MED ORDER — INSULIN DETEMIR 100 UNIT/ML ~~LOC~~ SOLN
10.0000 [IU] | Freq: Every day | SUBCUTANEOUS | Status: DC
  Administered 2012-11-09 – 2012-11-12 (×4): 10 [IU] via SUBCUTANEOUS
  Filled 2012-11-09 (×5): qty 0.1

## 2012-11-09 MED ORDER — MAGNESIUM HYDROXIDE 400 MG/5ML PO SUSP
30.0000 mL | Freq: Every day | ORAL | Status: DC | PRN
  Administered 2012-11-09: 30 mL via ORAL
  Filled 2012-11-09: qty 30

## 2012-11-09 NOTE — Progress Notes (Signed)
Inpatient Diabetes Program Recommendations  AACE/ADA: New Consensus Statement on Inpatient Glycemic Control (2013)  Target Ranges:  Prepandial:   less than 140 mg/dL      Peak postprandial:   less than 180 mg/dL (1-2 hours)      Critically ill patients:  140 - 180 mg/dL  Results for KARELYN, BRISBY (MRN [id number]) as of 11/09/2012 09:27  Ref. Range 11/08/2012 05:43 11/09/2012 05:48  Glucose Latest Range: 70-99 mg/dL 409 (H) 811 (H)   Results for LINDLEY, STACHNIK (MRN [id number]) as of 11/09/2012 09:27  Ref. Range 11/08/2012 07:28 11/08/2012 11:37 11/08/2012 16:15 11/08/2012 20:34 11/09/2012 07:29  Glucose-Capillary Latest Range: 70-99 mg/dL 213 (H) 086 (H) 578 (H) 233 (H) 272 (H)   Inpatient Diabetes Program Recommendations Insulin - Basal: Please consider ordering low dose basal insulin.  Recommend starting with Levemir 10 units QHS.  Note: Noted that Novolog correction was increased to resistant scale today and bedtime correction was added.  Blood glucose has ranged from 210-305 mg/dl over the past 24 hours and fasting glucose this morning was 272 mg/dl.  If blood glucose remains elevated greater than 200 mg/dl with increased correction insulin, please consider ordering low dose basal insulin; recommend starting with Levemir 10 units QHS.  Will continue to follow while inpatient.  Thanks, Orlando Penner, RN, MSN, CCRN Diabetes Coordinator Inpatient Diabetes Program [phone]

## 2012-11-09 NOTE — Progress Notes (Signed)
  Subjective: Feels better. Still some discomfort in the face. No fevers or chills  Objective: Vital signs in last 24 hours: Temp:  [98.4 F (36.9 C)-98.6 F (37 C)] 98.4 F (36.9 C) (08/29 0554) Pulse Rate:  [83-93] 83 (08/29 0554) Resp:  [18-20] 18 (08/29 0554) BP: (107-117)/(62-70) 115/63 mmHg (08/29 0554) SpO2:  [95 %-98 %] 98 % (08/29 0832) Last BM Date: 11/08/12  Intake/Output from previous day: 08/28 0701 - 08/29 0700 In: 2792.5 [P.O.:700; I.V.:1592.5; IV Piggyback:500] Out: 1350 [Urine:1350] Intake/Output this shift:    General appearance: alert and no distress Head: Left buccal region still demonstrates some swelling/edema and induration. The incision and drainage site is actively draining. No new areas of fluctuance. Erythema has improved  Lab Results:   Recent Labs  11/08/12 0543 11/09/12 0548  WBC 12.1* 9.5  HGB 14.0 14.0  HCT 40.0 40.4  PLT 203 219   BMET  Recent Labs  11/08/12 0543 11/09/12 0548  NA 134* 136  K 3.5 3.6  CL 98 100  CO2 24 27  GLUCOSE 255* 305*  BUN 12 13  CREATININE 0.53 0.88  CALCIUM 9.4 9.4   PT/INR No results found for this basename: LABPROT, INR,  in the last 72 hours ABG No results found for this basename: PHART, PCO2, PO2, HCO3,  in the last 72 hours  Studies/Results: No results found.  Anti-infectives: Anti-infectives   Start     Dose/Rate Route Frequency Ordered Stop   11/07/12 2200  vancomycin (VANCOCIN) IVPB 1000 mg/200 mL premix     1,000 mg 200 mL/hr over 60 Minutes Intravenous Every 12 hours 11/07/12 1536     11/07/12 1600  piperacillin-tazobactam (ZOSYN) IVPB 3.375 g     3.375 g 12.5 mL/hr over 240 Minutes Intravenous Every 8 hours 11/07/12 1536     11/07/12 0930  vancomycin (VANCOCIN) IVPB 1000 mg/200 mL premix     1,000 mg 200 mL/hr over 60 Minutes Intravenous  Once 11/07/12 0924 11/07/12 1131      Assessment/Plan: s/p * No surgery found * Facial abscess. Continue with local wound care.  Continue activity as tolerated. Consider switching to oral antibiotics and possible discharge in the next 24 hours. Continue warm compresses. Patient is discharged she will followup in my office next week.  LOS: 2 days    Maziah Smola C 11/09/2012

## 2012-11-09 NOTE — Progress Notes (Signed)
[facility] PROGRESS NOTE  OFFIE Alexandria Webster:[id number] DOB: [birth year] DOA: 11/07/2012 PCP: Colette Ribas, MD  Assessment/Plan: Facial cellulitis/abscess: Presumably from a spider. S/P I & D 11/08/12. Site actively draining moderate amount milky drainage.  Swelling and erythema somewhat improved this morning. Mild induration remains. Continue IV vancomycin and Zosyn day#3.    Nausea/vomiting: Likely related to #1. Nauseated this am.  Able to tolerate diet. Continue gently IV hydration.   Hyponatremia: Likely related #2. Resolved.  Continue IV fluids at lower rate.   Tachycardia: Likely related to infectious process and dehydration. Resolved.   Hypertension: Controlled. Will continue her home medications.  Leukocytosis: Related to #1. Resolved. Afebrile but slightly ill appearing. Non-toxic.   DM (diabetes mellitus): uncontrolled. A1c 12.5. CBG range 270-305.  Continue her oral agents. Increase SSI to resistant and added HS coverage as well as levemir per diabetic coordinator recommendations. Continue carb modified diet. Appreciate diabetic coordinator assistance.    Asthma: Stable at baseline. Continue her home and inhalers   PVD (peripheral vascular disease): Stable at baseline. Status post stent per Dr. Hart Rochester 10 years ago.   Constipation: likely related to pain med. On stool softner. Will provide MOM.  Code Status: full Family Communication: none present Disposition Plan: home when ready likely tomorrow  Consultants:  General surgery  Procedures:  I&D 11/08/12  Antibiotics: Vancomycin 11/07/12>>>> Zosyn 11/07/12>>>  HPI/Subjective: Lying in bed alert. Reports mild nausea. Pain managed. Reports constipation  Objective: Filed Vitals:   11/09/12 0554  BP: 115/63  Pulse: 83  Temp: 98.4 F (36.9 C)  Resp: 18    Intake/Output Summary (Last 24 hours) at 11/09/12 0957 Last data filed at 11/09/12 0600  Gross per 24 hour  Intake 2672.5 ml  Output   1350  ml  Net 1322.5 ml   Filed Weights   11/07/12 0849 11/07/12 1339  Weight: 74.39 kg (164 lb) 70.8 kg (156 lb 1.4 oz)    Exam:   General:  Well nourished somewhat ill appearing  Cardiovascular: RRR No MGR no LE edema  Respiratory: normal effort BS clear bilaterally no wheeze  Abdomen: obese soft +BS non-tender to palpation  Musculoskeletal: no clubbing no cyanosis  Skin: buccal abscess I&D site with moderate amount milky drainage and erythema and induration. Overall swelling improved.    Data Reviewed: Basic Metabolic Panel:  Recent Labs Lab 11/07/12 1003 11/08/12 0543 11/09/12 0548  NA 132* 134* 136  K 4.3 3.5 3.6  CL 93* 98 100  CO2 23 24 27   GLUCOSE 286* 255* 305*  BUN 12 12 13   CREATININE 0.47* 0.53 0.88  CALCIUM 9.6 9.4 9.4   Liver Function Tests:  Recent Labs Lab 11/07/12 1003  AST 12  ALT 11  ALKPHOS 90  BILITOT 0.4  PROT 8.3  ALBUMIN 3.5   No results found for this basename: LIPASE, AMYLASE,  in the last 168 hours No results found for this basename: AMMONIA,  in the last 168 hours CBC:  Recent Labs Lab 11/07/12 1003 11/08/12 0543 11/09/12 0548  WBC 14.2* 12.1* 9.5  NEUTROABS 11.8*  --   --   HGB 15.7* 14.0 14.0  HCT 45.7 40.0 40.4  MCV 95.4 95.2 95.7  PLT 188 203 219   Cardiac Enzymes: No results found for this basename: CKTOTAL, CKMB, CKMBINDEX, TROPONINI,  in the last 168 hours BNP (last 3 results) No results found for this basename: PROBNP,  in the last 8760 hours CBG:  Recent Labs Lab 11/08/12 0728 11/08/12 1137  11/08/12 1615 11/08/12 2034 11/09/12 0729  GLUCAP 210* 216* 226* 233* 272*    Recent Results (from the past 240 hour(s))  CULTURE, ROUTINE-ABSCESS     Status: None   Collection Time    11/08/12 12:30 PM      Result Value Range Status   Specimen Description ABSCESS FACE   Final   Special Requests VANCOMYCIN AND ZOSYN   Final   Gram Stain     Final   Value: ABUNDANT WBC PRESENT, PREDOMINANTLY PMN     NO  SQUAMOUS EPITHELIAL CELLS SEEN     FEW GRAM POSITIVE COCCI IN CLUSTERS     Performed at [facility]   Culture     Final   Value: Culture reincubated for better growth     Performed at [facility]   Report Status PENDING   Incomplete     Studies: No results found.  Scheduled Meds: . aspirin EC  81 mg Oral Daily  . budesonide-formoterol  2 puff Inhalation BID  . enoxaparin (LOVENOX) injection  40 mg Subcutaneous Q24H  . insulin aspart  0-20 Units Subcutaneous TID WC  . insulin aspart  0-5 Units Subcutaneous QHS  . metFORMIN  1,000 mg Oral BID WC   And  . linagliptin  5 mg Oral Q breakfast  . losartan  50 mg Oral Daily  . piperacillin-tazobactam (ZOSYN)  IV  3.375 g Intravenous Q8H  . polyethylene glycol  17 g Oral Daily  . senna-docusate  2 tablet Oral BID  . vancomycin  1,000 mg Intravenous Q12H   Continuous Infusions: . sodium chloride 10 mL/hr at 11/09/12 0734    Principal Problem:   Facial cellulitis Active Problems:   Hypertension   Leukocytosis   DM (diabetes mellitus)   Asthma   PVD (peripheral vascular disease)   Hyponatremia   Tachycardia   Nausea & vomiting    Time spent: 30 minutes    [facility]  [facility] Pager [phone]. If 7PM-7AM, please contact night-coverage at [email or web address], password [facility] 11/09/2012, 9:57 AM  LOS: 2 days    I have seen and examined this patient and I agree with the above assessment and plan.  [name], MD [facility] [phone]

## 2012-11-10 DIAGNOSIS — N179 Acute kidney failure, unspecified: Secondary | ICD-10-CM

## 2012-11-10 LAB — BASIC METABOLIC PANEL
BUN: 10 mg/dL (ref 6–23)
GFR calc Af Amer: 57 mL/min — ABNORMAL LOW (ref >90)
GFR calc non Af Amer: 49 mL/min — ABNORMAL LOW (ref >90)
Potassium: 3.3 mEq/L — ABNORMAL LOW (ref 3.5–5.1)
Sodium: 140 mEq/L (ref 135–145)

## 2012-11-10 LAB — GLUCOSE, CAPILLARY
Glucose-Capillary: 200 mg/dL — ABNORMAL HIGH (ref 70–99)
Glucose-Capillary: 204 mg/dL — ABNORMAL HIGH (ref 70–99)
Glucose-Capillary: 225 mg/dL — ABNORMAL HIGH (ref 70–99)

## 2012-11-10 MED ORDER — SODIUM CHLORIDE 0.9 % IV BOLUS (SEPSIS)
1000.0000 mL | Freq: Once | INTRAVENOUS | Status: AC
  Administered 2012-11-10: 1000 mL via INTRAVENOUS

## 2012-11-10 MED ORDER — VANCOMYCIN HCL 10 G IV SOLR
1500.0000 mg | INTRAVENOUS | Status: DC
  Filled 2012-11-10 (×2): qty 1500

## 2012-11-10 NOTE — Progress Notes (Signed)
ANTIBIOTIC CONSULT NOTE  Pharmacy Consult for Vancomycin & Zosyn Indication: cellulitis  No Known Allergies  Patient Measurements: Height: 5\' 4"  (162.6 cm) Weight: 156 lb 1.4 oz (70.8 kg) IBW/kg (Calculated) : 54.7  Vital Signs: Temp: 98.4 F (36.9 C) (08/30 0557) Temp src: Oral (08/30 0557) BP: 169/85 mmHg (08/30 0557) Pulse Rate: 92 (08/30 0557) Intake/Output from previous day: 08/29 0701 - 08/30 0700 In: 511.8 [I.V.:211.8; IV Piggyback:300] Out: -  Intake/Output from this shift:    Labs:  Recent Labs  11/08/12 0543 11/09/12 0548 11/10/12 0618  WBC 12.1* 9.5  --   HGB 14.0 14.0  --   PLT 203 219  --   CREATININE 0.53 0.88 1.27*   Estimated Creatinine Clearance: 52.3 ml/min (by C-G formula based on Cr of 1.27).  Recent Labs  11/09/12 2030  VANCOTROUGH 15.2     Microbiology: Recent Results (from the past 720 hour(s))  CULTURE, ROUTINE-ABSCESS     Status: None   Collection Time    11/08/12 12:30 PM      Result Value Range Status   Specimen Description ABSCESS FACE   Final   Special Requests VANCOMYCIN AND ZOSYN   Final   Gram Stain     Final   Value: ABUNDANT WBC PRESENT, PREDOMINANTLY PMN     NO SQUAMOUS EPITHELIAL CELLS SEEN     FEW GRAM POSITIVE COCCI IN CLUSTERS     Performed at [facility]   Culture     Final   Value: MODERATE STAPHYLOCOCCUS AUREUS     Note: RIFAMPIN AND GENTAMICIN SHOULD NOT BE USED AS SINGLE DRUGS FOR TREATMENT OF STAPH INFECTIONS.     Performed at [facility]   Report Status PENDING   Incomplete    Medical History: Past Medical History  Diagnosis Date  . Asthma   . Diabetes mellitus   . Hypertension   . PVD (peripheral vascular disease)     Medications:  Scheduled:  . aspirin EC  81 mg Oral Daily  . budesonide-formoterol  2 puff Inhalation BID  . enoxaparin (LOVENOX) injection  40 mg Subcutaneous Q24H  . insulin aspart  0-20 Units Subcutaneous TID WC  . insulin aspart  0-5 Units Subcutaneous QHS   . insulin detemir  10 Units Subcutaneous Daily  . losartan  50 mg Oral Daily  . piperacillin-tazobactam (ZOSYN)  IV  3.375 g Intravenous Q8H  . polyethylene glycol  17 g Oral Daily  . senna-docusate  2 tablet Oral BID  . sodium chloride  1,000 mL Intravenous Once  . vancomycin  1,000 mg Intravenous Q12H   Assessment: 47 yo F with facial cellulitis after spider bite that has worsened on oral Bactrim/ warm compresses as an outpatient.  Renal function is worsening.  Trough slightly above goal. Staph Aureus sensitivities pending.  Vancomycin 8/27>> Zosyn 8/27>>  Goal of Therapy:  Vancomycin trough level 10-15 mcg/ml  Plan:  1) Zosyn 3.375gm IV Q8h to be infused over 4hrs 2) Change Vancomycin to 1500mg  IV every 24 hours. 3) Repeat Vancomycin trough at steady state 4) Monitor renal function and cx data   Alexandria Webster 11/10/2012,11:12 AM

## 2012-11-10 NOTE — Progress Notes (Signed)
[facility] PROGRESS NOTE  Alexandria Webster WUJ:[id number] DOB: 02/01/1966 DOA: 11/07/2012 PCP: Alexandria Ribas, MD  Assessment/Plan: Facial cellulitis/abscess: Presumably from a spider. S/P I & D 11/08/12. Site actively draining moderate amount milky drainage.   - Microbiology showing Staphylococcus aureus, sensitivities pending. Meanwhile continue current antibiotic regimen Nausea/vomiting: Likely related to #1.  - Much improved this morning AKI - possibly related to #1. Provide IV hydration today and recheck in the morning Tachycardia: Likely related to infectious process and dehydration. Resolved. Hypertension: Controlled. Will continue her home medications.  Leukocytosis: Related to #1. Resolved. Afebrile but slightly ill appearing. Non-toxic. DM (diabetes mellitus): uncontrolled. A1c 12.5. CBG range 270-305.   -  continue only on insulin products given her AKI and discontinue metformin Asthma: Stable at baseline. Continue her home and inhalers  PVD (peripheral vascular disease): Stable at baseline. Status post stent per Dr. Hart Rochester 10 years ago.  Constipation: likely related to pain med.   Code Status: full Family Communication: none present Disposition Plan: home when ready likely tomorrow  Consultants:  General surgery  Procedures:  I&D 11/08/12  Antibiotics: Vancomycin 11/07/12>>>> Zosyn 11/07/12>>>  HPI/Subjective: - Subjectively feeling better today   Objective: Filed Vitals:   11/10/12 0557  BP: 169/85  Pulse: 92  Temp: 98.4 F (36.9 C)  Resp: 18    Intake/Output Summary (Last 24 hours) at 11/10/12 1319 Last data filed at 11/09/12 1700  Gross per 24 hour  Intake 261.83 ml  Output      0 ml  Net 261.83 ml   Filed Weights   11/07/12 0849 11/07/12 1339  Weight: 74.39 kg (164 lb) 70.8 kg (156 lb 1.4 oz)    Exam:   General:  Well nourished somewhat ill appearing  Cardiovascular: RRR No MGR no LE edema  Respiratory: normal effort BS clear  bilaterally no wheeze  Abdomen: obese soft +BS non-tender to palpation  Musculoskeletal: no clubbing no cyanosis  Skin: buccal abscess I&D site with moderate amount milky drainage and erythema and induration. Overall swelling improved.    Data Reviewed: Basic Metabolic Panel:  Recent Labs Lab 11/07/12 1003 11/08/12 0543 11/09/12 0548 11/10/12 0618  NA 132* 134* 136 140  K 4.3 3.5 3.6 3.3*  CL 93* 98 100 99  CO2 23 24 27 27   GLUCOSE 286* 255* 305* 177*  BUN 12 12 13 10   CREATININE 0.47* 0.53 0.88 1.27*  CALCIUM 9.6 9.4 9.4 10.2   Liver Function Tests:  Recent Labs Lab 11/07/12 1003  AST 12  ALT 11  ALKPHOS 90  BILITOT 0.4  PROT 8.3  ALBUMIN 3.5   CBC:  Recent Labs Lab 11/07/12 1003 11/08/12 0543 11/09/12 0548  WBC 14.2* 12.1* 9.5  NEUTROABS 11.8*  --   --   HGB 15.7* 14.0 14.0  HCT 45.7 40.0 40.4  MCV 95.4 95.2 95.7  PLT 188 203 219   CBG:  Recent Labs Lab 11/09/12 1130 11/09/12 1646 11/09/12 2036 11/10/12 0753 11/10/12 1153  GLUCAP 205* 112* 197* 200* 204*    Recent Results (from the past 240 hour(s))  CULTURE, ROUTINE-ABSCESS     Status: None   Collection Time    11/08/12 12:30 PM      Result Value Range Status   Specimen Description ABSCESS FACE   Final   Special Requests VANCOMYCIN AND ZOSYN   Final   Gram Stain     Final   Value: ABUNDANT WBC PRESENT, PREDOMINANTLY PMN     NO SQUAMOUS EPITHELIAL CELLS  SEEN     FEW GRAM POSITIVE COCCI IN CLUSTERS     Performed at [facility]   Culture     Final   Value: MODERATE STAPHYLOCOCCUS AUREUS     Note: RIFAMPIN AND GENTAMICIN SHOULD NOT BE USED AS SINGLE DRUGS FOR TREATMENT OF STAPH INFECTIONS.     Performed at [facility]   Report Status PENDING   Incomplete     Studies: No results found.  Scheduled Meds: . aspirin EC  81 mg Oral Daily  . budesonide-formoterol  2 puff Inhalation BID  . enoxaparin (LOVENOX) injection  40 mg Subcutaneous Q24H  . insulin aspart  0-20  Units Subcutaneous TID WC  . insulin aspart  0-5 Units Subcutaneous QHS  . insulin detemir  10 Units Subcutaneous Daily  . losartan  50 mg Oral Daily  . piperacillin-tazobactam (ZOSYN)  IV  3.375 g Intravenous Q8H  . polyethylene glycol  17 g Oral Daily  . senna-docusate  2 tablet Oral BID  . sodium chloride  1,000 mL Intravenous Once  . [START ON 11/11/2012] vancomycin  1,500 mg Intravenous Q24H   Continuous Infusions: . sodium chloride 10 mL/hr at 11/09/12 1610    Principal Problem:   Facial cellulitis Active Problems:   Hypertension   Leukocytosis   DM (diabetes mellitus)   Asthma   PVD (peripheral vascular disease)   Hyponatremia   Tachycardia   Nausea & vomiting   Unspecified constipation  Time spent: 25 minutes  Alexandria Webster  [facility] Pager [phone]. If 7PM-7AM, please contact night-coverage at [email or web address], password [facility] 11/10/2012, 1:19 PM  LOS: 3 days

## 2012-11-11 ENCOUNTER — Inpatient Hospital Stay (HOSPITAL_COMMUNITY): Payer: 59

## 2012-11-11 LAB — GLUCOSE, CAPILLARY
Glucose-Capillary: 100 mg/dL — ABNORMAL HIGH (ref 70–99)
Glucose-Capillary: 223 mg/dL — ABNORMAL HIGH (ref 70–99)

## 2012-11-11 LAB — BASIC METABOLIC PANEL
Chloride: 99 mEq/L (ref 96–112)
Creatinine, Ser: 2.3 mg/dL — ABNORMAL HIGH (ref 0.50–1.10)
GFR calc Af Amer: 28 mL/min — ABNORMAL LOW (ref >90)
Potassium: 3.3 mEq/L — ABNORMAL LOW (ref 3.5–5.1)

## 2012-11-11 LAB — CULTURE, ROUTINE-ABSCESS

## 2012-11-11 LAB — URINALYSIS, ROUTINE W REFLEX MICROSCOPIC
Bilirubin Urine: NEGATIVE
Hgb urine dipstick: NEGATIVE
Specific Gravity, Urine: <1.005 — ABNORMAL LOW (ref 1.005–1.030)
Urobilinogen, UA: 0.2 mg/dL (ref 0.0–1.0)

## 2012-11-11 LAB — NA AND K (SODIUM & POTASSIUM), RAND UR: Potassium Urine: 6 mEq/L

## 2012-11-11 LAB — VANCOMYCIN, TROUGH: Vancomycin Tr: 23.4 ug/mL — ABNORMAL HIGH (ref 10.0–20.0)

## 2012-11-11 MED ORDER — CLINDAMYCIN PHOSPHATE 600 MG/50ML IV SOLN
600.0000 mg | Freq: Three times a day (TID) | INTRAVENOUS | Status: DC
  Administered 2012-11-11 – 2012-11-12 (×3): 600 mg via INTRAVENOUS
  Filled 2012-11-11 (×7): qty 50

## 2012-11-11 MED ORDER — ENOXAPARIN SODIUM 30 MG/0.3ML ~~LOC~~ SOLN
30.0000 mg | SUBCUTANEOUS | Status: DC
  Administered 2012-11-11 – 2012-11-13 (×3): 30 mg via SUBCUTANEOUS
  Filled 2012-11-11 (×3): qty 0.3

## 2012-11-11 NOTE — Progress Notes (Signed)
Vancomycin trough 23.4, paging on-call pharmacist. Will follow instructions.

## 2012-11-11 NOTE — Progress Notes (Addendum)
[facility] PROGRESS NOTE  Alexandria Webster NWG:[id number] DOB: 1965/07/17 DOA: 11/07/2012 PCP: Colette Ribas, MD  Assessment/Plan: AKI  - kidney function worsened today despite additional hydration yesterday. Will discontinue Vancomycin and Zosyn today and start Clindamycin. She was on Septra as an outpatient prior to hospitalization and is a long standing diabetic as well. Discontinued ARB.  - have asked Nephrology to weigh in, appreciate input from Dr. Kristian Covey. - also had a recent course of Bactrim last month for chest wall cellulitis Facial cellulitis/abscess: Presumably from a spider. S/P I & D 11/08/12. Site actively draining moderate amount milky drainage.   - Microbiology showing Staphylococcus aureus, sensitivities pending. On vancomycin and Zosyn 8/27 - 8/31 and switched to clindamycin given AKI.   - cultures speciated MRSA this morning, fortunately it is sensitive to Clindamycin.  Nausea/vomiting: Likely related to #1.  - resolved Tachycardia: Likely related to infectious process and dehydration. Resolved. Hypertension: Controlled. Will continue her home medications.  Leukocytosis: Related to #1. Resolved DM (diabetes mellitus): uncontrolled. A1c 12.5. CBG range 270-305.   -  continue only on insulin products given her AKI and discontinue metformin Asthma: Stable at baseline. Continue her home and inhalers  PVD (peripheral vascular disease): Stable at baseline. Status post stent per Dr. Hart Rochester 10 years ago.  Constipation: likely related to pain med.   Code Status: full Family Communication: none present Disposition Plan: home when ready  Consultants:  General surgery  Nephrology  Procedures:  I&D 11/08/12  Antibiotics: Vancomycin 11/07/12>>>>11/11/12 Zosyn 11/07/12>>>11/11/12 Clindamycin 8/31 >>>  HPI/Subjective: - feeling better every day, asking about going home.   Objective: Filed Vitals:   11/11/12 0500  BP: 142/69  Pulse: 75  Temp: 98.1 F  (36.7 C)  Resp: 18   No intake or output data in the 24 hours ending 11/11/12 0809 Filed Weights   11/07/12 0849 11/07/12 1339  Weight: 74.39 kg (164 lb) 70.8 kg (156 lb 1.4 oz)   Exam:  General:  NAD  Cardiovascular: RRR No MGR no LE edema  Respiratory: normal effort BS clear bilaterally no wheeze  Abdomen: obese soft +BS non-tender to palpation  Musculoskeletal: no clubbing no cyanosis  Skin: buccal abscess I&D site significantly improved    Data Reviewed: Basic Metabolic Panel:  Recent Labs Lab 11/07/12 1003 11/08/12 0543 11/09/12 0548 11/10/12 0618 11/11/12 0449  NA 132* 134* 136 140 136  K 4.3 3.5 3.6 3.3* 3.3*  CL 93* 98 100 99 99  CO2 23 24 27 27 28   GLUCOSE 286* 255* 305* 177* 149*  BUN 12 12 13 10 13   CREATININE 0.47* 0.53 0.88 1.27* 2.30*  CALCIUM 9.6 9.4 9.4 10.2 9.4   Liver Function Tests:  Recent Labs Lab 11/07/12 1003  AST 12  ALT 11  ALKPHOS 90  BILITOT 0.4  PROT 8.3  ALBUMIN 3.5   CBC:  Recent Labs Lab 11/07/12 1003 11/08/12 0543 11/09/12 0548  WBC 14.2* 12.1* 9.5  NEUTROABS 11.8*  --   --   HGB 15.7* 14.0 14.0  HCT 45.7 40.0 40.4  MCV 95.4 95.2 95.7  PLT 188 203 219   CBG:  Recent Labs Lab 11/10/12 0753 11/10/12 1153 11/10/12 1647 11/10/12 2103 11/11/12 0717  GLUCAP 200* 204* 225* 122* 139*    Recent Results (from the past 240 hour(s))  CULTURE, ROUTINE-ABSCESS     Status: None   Collection Time    11/08/12 12:30 PM      Result Value Range Status   Specimen Description  ABSCESS FACE   Final   Special Requests VANCOMYCIN AND ZOSYN   Final   Gram Stain     Final   Value: ABUNDANT WBC PRESENT, PREDOMINANTLY PMN     NO SQUAMOUS EPITHELIAL CELLS SEEN     FEW GRAM POSITIVE COCCI IN CLUSTERS     Performed at [facility]   Culture     Final   Value: MODERATE STAPHYLOCOCCUS AUREUS     Note: RIFAMPIN AND GENTAMICIN SHOULD NOT BE USED AS SINGLE DRUGS FOR TREATMENT OF STAPH INFECTIONS.     Performed at [facility]   Report Status PENDING   Incomplete     Studies: No results found.  Scheduled Meds: . aspirin EC  81 mg Oral Daily  . budesonide-formoterol  2 puff Inhalation BID  . enoxaparin (LOVENOX) injection  40 mg Subcutaneous Q24H  . insulin aspart  0-20 Units Subcutaneous TID WC  . insulin aspart  0-5 Units Subcutaneous QHS  . insulin detemir  10 Units Subcutaneous Daily  . losartan  50 mg Oral Daily  . piperacillin-tazobactam (ZOSYN)  IV  3.375 g Intravenous Q8H  . polyethylene glycol  17 g Oral Daily  . senna-docusate  2 tablet Oral BID  . vancomycin  1,500 mg Intravenous Q24H   Continuous Infusions: . sodium chloride 10 mL/hr at 11/09/12 4782    Principal Problem:   Facial cellulitis Active Problems:   Hypertension   Leukocytosis   DM (diabetes mellitus)   Asthma   PVD (peripheral vascular disease)   Hyponatremia   Tachycardia   Nausea & vomiting   Unspecified constipation  Time spent: 35 minutes  [name]  [facility] Pager [phone]. If 7PM-7AM, please contact night-coverage at [email or web address], password [facility] 11/11/2012, 8:09 AM  LOS: 4 days

## 2012-11-11 NOTE — Progress Notes (Signed)
CRITICAL VALUE ALERT  Critical value received:  Wound culture Date of notification:  11/11/12  Time of notification: 0918  Critical value read back:yes  Nurse who received alert:  j Ajamu Maxon rn  Notified: Dr Lafe Garin at [phone]

## 2012-11-11 NOTE — Consult Note (Signed)
Reason for Consult:Renal failure Referring Physician: Dr. Danise Mina is an 47 y.o. female.  HPI: She is patient was history of hypertension, diabetes, (status post CABG presently her [facility] emergency room with the swelling of her her left face. According to the patient she might have spider bite and because of that she was having some swelling and cellulitis. Initially patient was put on Septra and discharged home to be followed as an outpatient. Since the swelling and pain with increasing patient decided to come to the hospital. Presently she was found to have MRSA and started on vancomycin. Since her renal function seems to be getting worse consult is called. Patient denies any previous history of renal failure however she has study because of the small attenuated area on the right kidney. She has MRI/MRA and also to be possibly from localized pyelonephritis. Her renal function however remains stable. Patient denies any history of proteinuria and no history of kidney stone.  Past Medical History  Diagnosis Date  . Asthma   . Diabetes mellitus   . Hypertension   . PVD (peripheral vascular disease)     Past Surgical History  Procedure Laterality Date  . Femoral artery stent  right leg  . Back surgery    . Coronary angioplasty with stent placement    . Coronary artery bypass graft      Family History  Problem Relation Age of Onset  . Stroke Mother   . Hypertension Mother   . Heart failure Father     Social History:  reports that she has been smoking Cigarettes.  She has a 15 pack-year smoking history. She has never used smokeless tobacco. She reports that she does not drink alcohol or use illicit drugs.  Allergies: No Known Allergies  Medications: I have reviewed the patient's current medications.  Results for orders placed during the hospital encounter of 11/07/12 (from the past 48 hour(s))  GLUCOSE, CAPILLARY     Status: Abnormal   Collection Time    11/09/12  11:30 AM      Result Value Range   Glucose-Capillary 205 (*) 70 - 99 mg/dL  GLUCOSE, CAPILLARY     Status: Abnormal   Collection Time    11/09/12  4:46 PM      Result Value Range   Glucose-Capillary 112 (*) 70 - 99 mg/dL  VANCOMYCIN, TROUGH     Status: None   Collection Time    11/09/12  8:30 PM      Result Value Range   Vancomycin Tr 15.2  10.0 - 20.0 ug/mL  GLUCOSE, CAPILLARY     Status: Abnormal   Collection Time    11/09/12  8:36 PM      Result Value Range   Glucose-Capillary 197 (*) 70 - 99 mg/dL   Comment 1 Notify RN    BASIC METABOLIC PANEL     Status: Abnormal   Collection Time    11/10/12  6:18 AM      Result Value Range   Sodium 140  135 - 145 mEq/L   Potassium 3.3 (*) 3.5 - 5.1 mEq/L   Chloride 99  96 - 112 mEq/L   CO2 27  19 - 32 mEq/L   Glucose, Bld 177 (*) 70 - 99 mg/dL   BUN 10  6 - 23 mg/dL   Creatinine, Ser 1.61 (*) 0.50 - 1.10 mg/dL   Calcium 09.6  8.4 - 04.5 mg/dL   GFR calc non Af Amer 49 (*) >90  mL/min   GFR calc Af Amer 57 (*) >90 mL/min   Comment: (NOTE)     The eGFR has been calculated using the CKD EPI equation.     This calculation has not been validated in all clinical situations.     eGFR's persistently <90 mL/min signify possible Chronic Kidney     Disease.  GLUCOSE, CAPILLARY     Status: Abnormal   Collection Time    11/10/12  7:53 AM      Result Value Range   Glucose-Capillary 200 (*) 70 - 99 mg/dL  GLUCOSE, CAPILLARY     Status: Abnormal   Collection Time    11/10/12 11:53 AM      Result Value Range   Glucose-Capillary 204 (*) 70 - 99 mg/dL  GLUCOSE, CAPILLARY     Status: Abnormal   Collection Time    11/10/12  4:47 PM      Result Value Range   Glucose-Capillary 225 (*) 70 - 99 mg/dL  GLUCOSE, CAPILLARY     Status: Abnormal   Collection Time    11/10/12  9:03 PM      Result Value Range   Glucose-Capillary 122 (*) 70 - 99 mg/dL   Comment 1 Notify RN    BASIC METABOLIC PANEL     Status: Abnormal   Collection Time    11/11/12   4:49 AM      Result Value Range   Sodium 136  135 - 145 mEq/L   Potassium 3.3 (*) 3.5 - 5.1 mEq/L   Chloride 99  96 - 112 mEq/L   CO2 28  19 - 32 mEq/L   Glucose, Bld 149 (*) 70 - 99 mg/dL   BUN 13  6 - 23 mg/dL   Creatinine, Ser 4.54 (*) 0.50 - 1.10 mg/dL   Comment: DELTA CHECK NOTED     REPEATED TO VERIFY   Calcium 9.4  8.4 - 10.5 mg/dL   GFR calc non Af Amer 24 (*) >90 mL/min   GFR calc Af Amer 28 (*) >90 mL/min   Comment: (NOTE)     The eGFR has been calculated using the CKD EPI equation.     This calculation has not been validated in all clinical situations.     eGFR's persistently <90 mL/min signify possible Chronic Kidney     Disease.  VANCOMYCIN, TROUGH     Status: Abnormal   Collection Time    11/11/12  4:49 AM      Result Value Range   Vancomycin Tr 23.4 (*) 10.0 - 20.0 ug/mL  GLUCOSE, CAPILLARY     Status: Abnormal   Collection Time    11/11/12  7:17 AM      Result Value Range   Glucose-Capillary 139 (*) 70 - 99 mg/dL    No results found.  Review of Systems  Constitutional: Negative for chills and weight loss.  Respiratory: Negative for cough and hemoptysis.   Gastrointestinal: Negative for nausea, vomiting and diarrhea.  Skin: Positive for rash.   Blood pressure 142/69, pulse 75, temperature 98.1 F (36.7 C), temperature source Oral, resp. rate 18, height 5\' 4"  (1.626 m), weight 70.8 kg (156 lb 1.4 oz), SpO2 99.00%. Physical Exam  Constitutional: She is oriented to person, place, and time.  HENT:  Patient with left facial sweling presently has dressing  Eyes: No scleral icterus.  Cardiovascular: Normal rate, regular rhythm and normal heart sounds.   Respiratory: No respiratory distress. She has no wheezes.  GI: She exhibits  no distension. There is no tenderness.  Neurological: She is alert and oriented to person, place, and time.    Assessment/Plan: Problem #1 acute kidney injury presently seems to be multifactorial including vancomycin-induced acute  kidney injury/ ATN/prerenal syndrome/Bactrim induced acute kidney injury. Presently she is none oliguric. Problem #2 diabetes her blood sugar very well controlled Problem #3 history of coronary artery disease she status post CABG Problem #4 peripheral vascular disease status post femoral stump placement Problem #5 hypertension Problem #6 hypokalemia Problem #7 history of asthma Problem #8 cellulitis of her face. Plan: We'll check her urinalysis to see whether patient has proteinuria We'll check also her ultrasound of the kidneys. Agree with discontinuation of vancomycin. We'll start hydrating patient We'll check urine sodium, creatinine for  fractional excretion of sodium. We'll check her basic metabolic panel in the morning. [email or web address],Channel Papandrea S 11/11/2012, 9:52 AM

## 2012-11-12 LAB — GLUCOSE, CAPILLARY
Glucose-Capillary: 230 mg/dL — ABNORMAL HIGH (ref 70–99)
Glucose-Capillary: 241 mg/dL — ABNORMAL HIGH (ref 70–99)
Glucose-Capillary: 306 mg/dL — ABNORMAL HIGH (ref 70–99)
Glucose-Capillary: 193 mg/dL — ABNORMAL HIGH (ref 70–99)

## 2012-11-12 LAB — COMPREHENSIVE METABOLIC PANEL
ALT: 12 U/L (ref 0–35)
AST: 15 U/L (ref 0–37)
Albumin: 2.4 g/dL — ABNORMAL LOW (ref 3.5–5.2)
CO2: 22 mEq/L (ref 19–32)
Calcium: 8.6 mg/dL (ref 8.4–10.5)
Sodium: 139 mEq/L (ref 135–145)
Total Protein: 6.1 g/dL (ref 6.0–8.3)

## 2012-11-12 LAB — CBC
HCT: 35 % — ABNORMAL LOW (ref 36.0–46.0)
Hemoglobin: 12 g/dL (ref 12.0–15.0)
MCV: 95.1 fL (ref 78.0–100.0)
RBC: 3.68 MIL/uL — ABNORMAL LOW (ref 3.87–5.11)
WBC: 7.6 10*3/uL (ref 4.0–10.5)

## 2012-11-12 MED ORDER — CLINDAMYCIN HCL 150 MG PO CAPS
450.0000 mg | ORAL_CAPSULE | Freq: Three times a day (TID) | ORAL | Status: DC
  Administered 2012-11-12 – 2012-11-14 (×6): 450 mg via ORAL
  Filled 2012-11-12 (×6): qty 3

## 2012-11-12 MED ORDER — KCL IN DEXTROSE-NACL 20-5-0.45 MEQ/L-%-% IV SOLN
INTRAVENOUS | Status: DC
  Administered 2012-11-12 (×2): via INTRAVENOUS
  Administered 2012-11-13: 1000 mL via INTRAVENOUS
  Filled 2012-11-12: qty 1000

## 2012-11-12 MED ORDER — FUROSEMIDE 10 MG/ML IJ SOLN
40.0000 mg | Freq: Two times a day (BID) | INTRAMUSCULAR | Status: DC
  Administered 2012-11-12 – 2012-11-13 (×4): 40 mg via INTRAVENOUS
  Filled 2012-11-12 (×4): qty 4

## 2012-11-12 NOTE — Progress Notes (Signed)
Subjective: Interval History: has no complaint of nausea or vomiting. Patient denies any difficulty in breathing. Patient has this moment offers no complaints..  Objective: Vital signs in last 24 hours: Temp:  [98 F (36.7 C)-98.6 F (37 C)] 98 F (36.7 C) (09/01 0535) Pulse Rate:  [76-84] 76 (09/01 0535) Resp:  [18] 18 (09/01 0535) BP: (123-152)/(74-79) 127/74 mmHg (09/01 0535) SpO2:  [97 %-100 %] 97 % (09/01 0813) Weight change:   Intake/Output from previous day: 08/31 0701 - 09/01 0700 In: 1066 [I.V.:966; IV Piggyback:100] Out: -  Intake/Output this shift:    General appearance: alert, cooperative and no distress Resp: clear to auscultation bilaterally Cardio: regular rate and rhythm, S1, S2 normal, no murmur, click, rub or gallop GI: soft, non-tender; bowel sounds normal; no masses,  no organomegaly Extremities: extremities normal, atraumatic, no cyanosis or edema  Lab Results:  Recent Labs  11/12/12 0501  WBC 7.6  HGB 12.0  HCT 35.0*  PLT 246   BMET:  Recent Labs  11/11/12 0449 11/12/12 0501  NA 136 139  K 3.3* 3.2*  CL 99 106  CO2 28 22  GLUCOSE 149* 274*  BUN 13 15  CREATININE 2.30* 2.80*  CALCIUM 9.4 8.6   No results found for this basename: PTH,  in the last 72 hours Iron Studies: No results found for this basename: IRON, TIBC, TRANSFERRIN, FERRITIN,  in the last 72 hours  Studies/Results: US Renal  11/11/2012   *RADIOLOGY REPORT*  Clinical Data: Renal failure  RENAL/URINARY TRACT ULTRASOUND COMPLETE  Comparison:  None.  Findings:  Right Kidney:  11.9 cm in length.  No mass lesion or hydronephrosis is noted.  Left Kidney:  13 cm in length.  No mass lesion or hydronephrosis is noted.  Bladder:  Partially distended.  IMPRESSION: No acute abnormality noted.   Original Report Authenticated By: Alcide Clever, M.D.    I have reviewed the patient's current medications.  Assessment/Plan: Problem #1 acute kidney injury. BUN is 15 creatinine is 2.8 renal  function continue to decline. At this moment the rate of increase has slowed down. Patient doesn't have any uremic sign and  symptoms the  etiology at this moment seems to be multifactorial.. Her ultrasound of the kidneys didn't show any hydronephrosis. Presently patient also does not have any proteinuria. Problem #2 hypokalemia Problem #3 diabetes Problem #4 hypertension her blood pressure seems reasonably controlled Problem #5 hyponatremia sodium has improved Problem #6 partial cellulitis patient presently on antibiotics patient is afebrile and her white blood cell count is normal.  Problem #7 history of asthma Problem #8 peripheral vascular disease. Plan: Change her IV fluid to to  Half normal saline with 20 mEq of KCl at 135 cc per hour We'll start her on Lasix 20 mg IV twice a day Check her basic metabolic panel and phosphorus in the morning.   LOS: 5 days   Kenishia Plack S 11/12/2012,8:47 AM

## 2012-11-12 NOTE — Progress Notes (Signed)
[facility] PROGRESS NOTE  VERLE BRILLHART ZOX:[id number] DOB: 06/12/1965 DOA: 11/07/2012 PCP: Colette Ribas, MD  Assessment/Plan: AKI  - kidney function a bit worse today, but GFR decrease is not as significant as yesterday.  - Vancomycin and Zosyn were discontinued on 11/11/2012, and patient was put on clindamycin. - She was on Septra as an outpatient prior to hospitalization and is a long standing diabetic as well. Discontinued ARB.  - appreciate input from Dr. Kristian Covey. - also had a recent course of Bactrim last month for chest wall cellulitis Facial cellulitis/abscess: Presumably from a spider. S/P I & D 11/08/12. Site better.   - cultures with MRSA sensitive to Clindamycin.  - Change to oral antibiotics, plan for 7-10 days following the drainage Nausea/vomiting: Likely related to #1.  - resolved Tachycardia: Likely related to infectious process and dehydration. Resolved. Hypertension: Controlled. Will continue her home medications.  Leukocytosis: Related to #1. Resolved DM (diabetes mellitus): uncontrolled. A1c 12.5. CBG range 270-305.   -  continue only on insulin products given her AKI and discontinue metformin Asthma: Stable at baseline. Continue her home and inhalers  PVD (peripheral vascular disease): Stable at baseline. Status post stent per Dr. Hart Rochester 10 years ago.  Constipation: likely related to pain med.   Code Status: full Family Communication: none present Disposition Plan: home when ready  Consultants:  General surgery  Nephrology  Procedures:  I&D 11/08/12  Antibiotics: Vancomycin 11/07/12>>>>11/11/12 Zosyn 11/07/12>>>11/11/12 Clindamycin 8/31 >>>  HPI/Subjective: - She has no complaints this morning, appreciates improvement in her left face abscess  Objective: Filed Vitals:   11/12/12 0535  BP: 127/74  Pulse: 76  Temp: 98 F (36.7 C)  Resp: 18    Intake/Output Summary (Last 24 hours) at 11/12/12 0959 Last data filed at 11/11/12  1713  Gross per 24 hour  Intake   1066 ml  Output      0 ml  Net   1066 ml   Filed Weights   11/07/12 0849 11/07/12 1339  Weight: 74.39 kg (164 lb) 70.8 kg (156 lb 1.4 oz)   Exam:  General:  NAD  Cardiovascular: RRR No MGR no LE edema  Respiratory: normal effort BS clear bilaterally no wheeze  Abdomen: obese soft +BS non-tender to palpation  Musculoskeletal: no clubbing no cyanosis  Skin: buccal abscess I&D site significantly improved    Data Reviewed: Basic Metabolic Panel:  Recent Labs Lab 11/08/12 0543 11/09/12 0548 11/10/12 0618 11/11/12 0449 11/12/12 0501  NA 134* 136 140 136 139  K 3.5 3.6 3.3* 3.3* 3.2*  CL 98 100 99 99 106  CO2 24 27 27 28 22   GLUCOSE 255* 305* 177* 149* 274*  BUN 12 13 10 13 15   CREATININE 0.53 0.88 1.27* 2.30* 2.80*  CALCIUM 9.4 9.4 10.2 9.4 8.6  MG  --   --   --   --  1.8  PHOS  --   --   --   --  5.5*   Liver Function Tests:  Recent Labs Lab 11/07/12 1003 11/12/12 0501  AST 12 15  ALT 11 12  ALKPHOS 90 62  BILITOT 0.4 <0.1*  PROT 8.3 6.1  ALBUMIN 3.5 2.4*   CBC:  Recent Labs Lab 11/07/12 1003 11/08/12 0543 11/09/12 0548 11/12/12 0501  WBC 14.2* 12.1* 9.5 7.6  NEUTROABS 11.8*  --   --   --   HGB 15.7* 14.0 14.0 12.0  HCT 45.7 40.0 40.4 35.0*  MCV 95.4 95.2 95.7 95.1  PLT 188 203 219 246   CBG:  Recent Labs Lab 11/11/12 0717 11/11/12 1130 11/11/12 1646 11/11/12 2053 11/12/12 0720  GLUCAP 139* 176* 100* 223* 230*    Recent Results (from the past 240 hour(s))  CULTURE, ROUTINE-ABSCESS     Status: None   Collection Time    11/08/12 12:30 PM      Result Value Range Status   Specimen Description ABSCESS FACE   Final   Special Requests VANCOMYCIN AND ZOSYN   Final   Gram Stain     Final   Value: ABUNDANT WBC PRESENT, PREDOMINANTLY PMN     NO SQUAMOUS EPITHELIAL CELLS SEEN     FEW GRAM POSITIVE COCCI IN CLUSTERS     Performed at [facility]   Culture     Final   Value: MODERATE METHICILLIN  RESISTANT STAPHYLOCOCCUS AUREUS     Note: RIFAMPIN AND GENTAMICIN SHOULD NOT BE USED AS SINGLE DRUGS FOR TREATMENT OF STAPH INFECTIONS. This organism DOES NOT demonstrate inducible Clindamycin resistance in vitro. CRITICAL RESULT CALLED TO, READ BACK BY AND VERIFIED WITH: JESICA B 8/31      @915  BY REAMM     Performed at [facility]   Report Status 11/11/2012 FINAL   Final   Organism ID, Bacteria METHICILLIN RESISTANT STAPHYLOCOCCUS AUREUS   Final     Studies: US Renal  11/11/2012   *RADIOLOGY REPORT*  Clinical Data: Renal failure  RENAL/URINARY TRACT ULTRASOUND COMPLETE  Comparison:  None.  Findings:  Right Kidney:  11.9 cm in length.  No mass lesion or hydronephrosis is noted.  Left Kidney:  13 cm in length.  No mass lesion or hydronephrosis is noted.  Bladder:  Partially distended.  IMPRESSION: No acute abnormality noted.   Original Report Authenticated By: Alcide Clever, M.D.    Scheduled Meds: . aspirin EC  81 mg Oral Daily  . budesonide-formoterol  2 puff Inhalation BID  . clindamycin (CLEOCIN) IV  600 mg Intravenous Q8H  . enoxaparin (LOVENOX) injection  30 mg Subcutaneous Q24H  . furosemide  40 mg Intravenous BID  . insulin aspart  0-20 Units Subcutaneous TID WC  . insulin aspart  0-5 Units Subcutaneous QHS  . insulin detemir  10 Units Subcutaneous Daily  . polyethylene glycol  17 g Oral Daily  . senna-docusate  2 tablet Oral BID   Continuous Infusions: . dextrose 5 % and 0.45 % NaCl with KCl 20 mEq/L 135 mL/hr at 11/12/12 0930    Principal Problem:   Facial cellulitis Active Problems:   Hypertension   Leukocytosis   DM (diabetes mellitus)   Asthma   PVD (peripheral vascular disease)   Hyponatremia   Tachycardia   Nausea & vomiting   Unspecified constipation  Time spent: 25 minutes  [name]  [facility] Pager [phone]. If 7PM-7AM, please contact night-coverage at [email or web address], password [facility] 11/12/2012, 9:59 AM  LOS: 5 days

## 2012-11-13 DIAGNOSIS — E876 Hypokalemia: Secondary | ICD-10-CM | POA: Diagnosis not present

## 2012-11-13 LAB — BASIC METABOLIC PANEL
Calcium: 8.8 mg/dL (ref 8.4–10.5)
GFR calc Af Amer: 23 mL/min — ABNORMAL LOW (ref >90)
GFR calc non Af Amer: 20 mL/min — ABNORMAL LOW (ref >90)
Sodium: 137 mEq/L (ref 135–145)

## 2012-11-13 LAB — GLUCOSE, CAPILLARY
Glucose-Capillary: 273 mg/dL — ABNORMAL HIGH (ref 70–99)
Glucose-Capillary: 109 mg/dL — ABNORMAL HIGH (ref 70–99)

## 2012-11-13 MED ORDER — INSULIN DETEMIR 100 UNIT/ML ~~LOC~~ SOLN
12.0000 [IU] | Freq: Every day | SUBCUTANEOUS | Status: DC
  Administered 2012-11-14: 12 [IU] via SUBCUTANEOUS
  Filled 2012-11-13: qty 0.12

## 2012-11-13 MED ORDER — INSULIN PEN STARTER KIT
1.0000 | Freq: Once | Status: AC
  Administered 2012-11-13: 1
  Filled 2012-11-13: qty 1

## 2012-11-13 MED ORDER — INSULIN DETEMIR 100 UNIT/ML ~~LOC~~ SOLN
12.0000 [IU] | SUBCUTANEOUS | Status: AC
  Administered 2012-11-13: 12 [IU] via SUBCUTANEOUS
  Filled 2012-11-13: qty 0.12

## 2012-11-13 MED ORDER — POTASSIUM CHLORIDE CRYS ER 20 MEQ PO TBCR: 30.0000 meq | EXTENDED_RELEASE_TABLET | Freq: Once | ORAL | Status: DC

## 2012-11-13 MED ORDER — POTASSIUM CHLORIDE CRYS ER 20 MEQ PO TBCR
40.0000 meq | EXTENDED_RELEASE_TABLET | Freq: Two times a day (BID) | ORAL | Status: DC
  Administered 2012-11-13 – 2012-11-14 (×3): 40 meq via ORAL
  Filled 2012-11-13: qty 2
  Filled 2012-11-13: qty 1
  Filled 2012-11-13: qty 2

## 2012-11-13 MED ORDER — POTASSIUM CHLORIDE IN NACL 20-0.45 MEQ/L-% IV SOLN
INTRAVENOUS | Status: DC
  Administered 2012-11-13 (×2): via INTRAVENOUS
  Filled 2012-11-13 (×4): qty 1000

## 2012-11-13 NOTE — Progress Notes (Signed)
Due to acute kidney injury, patient will discharge home on insulin.  According to Dr. Elvera Lennox patient will likely be discharged on Levemir to be given once a day and she will follow up with Dr. Fransico Him.  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  Recommend using insulin pens for patient as an outpatient.  Reviewed Levemir and Novolog with patient (since she is receiving both as an inpatient and to help her understand the differences with both types of insulin).  Instructed and demonstrated proper technique for insulin pen injections.  Patient is able to verbalize proper technique and is also able to demonstrate proper technique for insulin injections via insulin pen.  Provided patient with information booklets on Levemir and Novolog.  Both information booklets have push button instructions for insulin pen injections.  Have asked patient to watch diabetes education videos and have provided written instructions on how to access the patient education network using room phone.  Ordered insulin pen starter kit.  Nursing:  Please continue to educate patient on insulin injections and allow patient to give herself insulin injections to ensure she is knowledgeable and comfortable with self-administrating insulin injections.  Have asked patient to check her blood glucose 4 times a day at home and keep a log to take with her when she follows up with her PCP and with Dr. Fransico Him.  Patient verbalized understanding of information discussed and she reports that she has no further questions at this time related to her diabetes.    Thanks, Orlando Penner, RN, MSN, CCRN Diabetes Coordinator Inpatient Diabetes Program [phone]

## 2012-11-13 NOTE — Progress Notes (Signed)
I have seen and examined this patient and I agree with the above assessment and plan.  Hardie Veltre, MD [facility] [phone]  

## 2012-11-13 NOTE — Progress Notes (Signed)
Inpatient Diabetes Program Recommendations  AACE/ADA: New Consensus Statement on Inpatient Glycemic Control (2013)  Target Ranges:  Prepandial:   less than 140 mg/dL      Peak postprandial:   less than 180 mg/dL (1-2 hours)      Critically ill patients:  140 - 180 mg/dL  Results for Alexandria Webster, Alexandria Webster (MRN [id number]) as of 11/13/2012 08:18  Ref. Range 11/11/2012 07:17 11/11/2012 11:30 11/11/2012 16:46 11/11/2012 20:53  Glucose-Capillary Latest Range: 70-99 mg/dL 213 (H) 086 (H) 578 (H) 223 (H)   Results for Alexandria Webster, Alexandria Webster (MRN [id number]) as of 11/13/2012 08:18  Ref. Range 11/12/2012 07:20 11/12/2012 12:00 11/12/2012 17:05 11/12/2012 20:42 11/13/2012 07:30  Glucose-Capillary Latest Range: 70-99 mg/dL 413 (H) 244 (H) 010 (H) 193 (H) 273 (H)    Inpatient Diabetes Program Recommendations IV fluids:  Please re-evaluate need for dextrose in IVF.  Patient was started on D5 0.45% NS with KCl @ 135 ml/hr on 11/12/2012. Basal insulin:  Please consider increasing Levemir to 12 units daily.  Note: Noted blood glucose over the past 24 hours has ranged from 193-306 mg/dl.  Patient was started on D5 0.45% NS with 20 mEq KCl @ 135 ml/hr on 11/12/2012 which has likely contributed to noted hyperglycemia.  Please reevaluate need for dextrose in IV fluids and consider increasing Levemir to 12 units daily.  Will continue to follow.  Thanks, Orlando Penner, RN, MSN, CCRN Diabetes Coordinator Inpatient Diabetes Program [phone]

## 2012-11-13 NOTE — Progress Notes (Signed)
Subjective: Interval History: has no complaint of nausea or vomiting. Patient denies any difficulty in breathing. Patient has this moment offers no complaints. Her facial swelling is getting better. Presently she is asking when she is going to go home otherwise no other issues..  Objective: Vital signs in last 24 hours: Temp:  [98 F (36.7 C)-98.6 F (37 C)] 98 F (36.7 C) (09/02 0500) Pulse Rate:  [75-80] 80 (09/02 0500) Resp:  [18-20] 19 (09/02 0500) BP: (111-145)/(58-77) 145/58 mmHg (09/02 0500) SpO2:  [95 %-100 %] 96 % (09/02 0723) Weight change:   Intake/Output from previous day: 09/01 0701 - 09/02 0700 In: 3561.3 [I.V.:3561.3] Out: 3700 [Urine:3700] Intake/Output this shift:    Generally she's alert in no apparent distress Her facial swelling seems to be getting better still show some swelling and she is a dressing on her wound. Chest is clear to auscultation shouldn't have any rales rhonchi or egophony Heart exam regular rate and rhythm normal history Abdomen soft positive bowel sound Extremities no edema  Lab Results:  Recent Labs  11/12/12 0501  WBC 7.6  HGB 12.0  HCT 35.0*  PLT 246   BMET:   Recent Labs  11/12/12 0501 11/13/12 0509  NA 139 137  K 3.2* 3.3*  CL 106 104  CO2 22 23  GLUCOSE 274* 270*  BUN 15 15  CREATININE 2.80* 2.67*  CALCIUM 8.6 8.8   No results found for this basename: PTH,  in the last 72 hours Iron Studies: No results found for this basename: IRON, TIBC, TRANSFERRIN, FERRITIN,  in the last 72 hours  Studies/Results: US Renal  11/11/2012   *RADIOLOGY REPORT*  Clinical Data: Renal failure  RENAL/URINARY TRACT ULTRASOUND COMPLETE  Comparison:  None.  Findings:  Right Kidney:  11.9 cm in length.  No mass lesion or hydronephrosis is noted.  Left Kidney:  13 cm in length.  No mass lesion or hydronephrosis is noted.  Bladder:  Partially distended.  IMPRESSION: No acute abnormality noted.   Original Report Authenticated By: Alcide Clever,  M.D.    I have reviewed the patient's current medications.  Assessment/Plan: Problem #1 acute kidney injury. BUN is 15 and weekend reatinine is 2.67  renal function continue to decline. At this moment her renal function os improving. Problem #2 hypokalemia her potassium is low but improving. Problem #3 diabetes Problem #4 hypertension her blood pressure seems reasonably controlled Problem #5 hyponatremia sodium has corrected Problem #6 facial  cellulitis patient presently on antibiotics patient is afebrile and her white blood cell count is normal.  Problem #7 history of asthma Problem #8 peripheral vascular disease. Plan:continue with present treatment Start on kcl 40 meq po bid X one day Basic metabolic panel in am.   LOS: 6 days   Keeara Frees S 11/13/2012,8:02 AM

## 2012-11-13 NOTE — Progress Notes (Signed)
UR chart review completed.  

## 2012-11-13 NOTE — Progress Notes (Signed)
[facility] PROGRESS NOTE  Alexandria Webster ZOX:[id number] DOB: 1966-01-18 DOA: 11/07/2012 PCP: Colette Ribas, MD  Assessment/Plan: AKI: kidney function improving slowly.  Vancomycin and Zosyn were discontinued on 11/11/2012, and patient was put on clindamycin day #3). She was on Septra as an outpatient prior to hospitalization and is a long standing diabetic as well. Discontinued ARB. Appreciate input from Dr. Kristian Covey who opines if renal function continues to improve may be able to discharge tomorrow. Changed fluid to 1/2NS per renal note, but pt had D5 1/2 NS going.    Facial cellulitis/abscess: Presumably from a spider. S/P I & D 11/08/12. Site continues to improve. Cultures with MRSA sensitive to Clindamycin day #3.   Nausea/vomiting: Likely related to #1.  resolved   Tachycardia: Likely related to infectious process and dehydration. Resolved.   Hypertension: Controlled. Will continue her home medications.  Hypokalemia: will replete and recheck.    Leukocytosis: Related to #1. Resolved   DM (diabetes mellitus): uncontrolled. A1c 12.5. CBG range 193-273. Continue only on insulin products given her AKI and discontinue metformin. Changed fluid from D51/2 NS to 1/2NS +20KCL.    Asthma: Stable at baseline. Continue her home and inhalers   PVD (peripheral vascular disease): Stable at baseline. Status post stent per Dr. Hart Rochester 10 years ago.   Constipation: likely related to pain med. resolved  Code Status: full Family Communication: none present Disposition Plan: home hopefully tomorrow   Consultants:  Renal  General surgery  Procedures:  I & D 11/08/12  Antibiotics: Vancomycin 11/07/12>>>>11/11/12  Zosyn 11/07/12>>>11/11/12  Clindamycin 8/31 >>>   HPI/Subjective: Sitting in chair on phone. Denies pain/dicom  Objective: Filed Vitals:   11/13/12 0500  BP: 145/58  Pulse: 80  Temp: 98 F (36.7 C)  Resp: 19    Intake/Output Summary (Last 24 hours) at  11/13/12 1002 Last data filed at 11/13/12 0840  Gross per 24 hour  Intake 1368.75 ml  Output   3650 ml  Net -2281.25 ml   Filed Weights   11/07/12 0849 11/07/12 1339  Weight: 74.39 kg (164 lb) 70.8 kg (156 lb 1.4 oz)    Exam:   General:  Well nourished nad  Cardiovascular: RRR No LE edema  Respiratory: normal effort BS clear bilaterally  Abdomen: soft non-tender to palpation  Musculoskeletal:  Ambulates in room with steady gait. Joints without swelling/erythema  Data Reviewed: Basic Metabolic Panel:  Recent Labs Lab 11/09/12 0548 11/10/12 0618 11/11/12 0449 11/12/12 0501 11/13/12 0509  NA 136 140 136 139 137  K 3.6 3.3* 3.3* 3.2* 3.3*  CL 100 99 99 106 104  CO2 27 27 28 22 23   GLUCOSE 305* 177* 149* 274* 270*  BUN 13 10 13 15 15   CREATININE 0.88 1.27* 2.30* 2.80* 2.67*  CALCIUM 9.4 10.2 9.4 8.6 8.8  MG  --   --   --  1.8  --   PHOS  --   --   --  5.5* 4.6   Liver Function Tests:  Recent Labs Lab 11/07/12 1003 11/12/12 0501  AST 12 15  ALT 11 12  ALKPHOS 90 62  BILITOT 0.4 <0.1*  PROT 8.3 6.1  ALBUMIN 3.5 2.4*   No results found for this basename: LIPASE, AMYLASE,  in the last 168 hours No results found for this basename: AMMONIA,  in the last 168 hours CBC:  Recent Labs Lab 11/07/12 1003 11/08/12 0543 11/09/12 0548 11/12/12 0501  WBC 14.2* 12.1* 9.5 7.6  NEUTROABS 11.8*  --   --   --  HGB 15.7* 14.0 14.0 12.0  HCT 45.7 40.0 40.4 35.0*  MCV 95.4 95.2 95.7 95.1  PLT 188 203 219 246   Cardiac Enzymes: No results found for this basename: CKTOTAL, CKMB, CKMBINDEX, TROPONINI,  in the last 168 hours BNP (last 3 results) No results found for this basename: PROBNP,  in the last 8760 hours CBG:  Recent Labs Lab 11/12/12 0720 11/12/12 1200 11/12/12 1705 11/12/12 2042 11/13/12 0730  GLUCAP 230* 241* 306* 193* 273*    Recent Results (from the past 240 hour(s))  CULTURE, ROUTINE-ABSCESS     Status: None   Collection Time    11/08/12  12:30 PM      Result Value Range Status   Specimen Description ABSCESS FACE   Final   Special Requests VANCOMYCIN AND ZOSYN   Final   Gram Stain     Final   Value: ABUNDANT WBC PRESENT, PREDOMINANTLY PMN     NO SQUAMOUS EPITHELIAL CELLS SEEN     FEW GRAM POSITIVE COCCI IN CLUSTERS     Performed at [facility]   Culture     Final   Value: MODERATE METHICILLIN RESISTANT STAPHYLOCOCCUS AUREUS     Note: RIFAMPIN AND GENTAMICIN SHOULD NOT BE USED AS SINGLE DRUGS FOR TREATMENT OF STAPH INFECTIONS. This organism DOES NOT demonstrate inducible Clindamycin resistance in vitro. CRITICAL RESULT CALLED TO, READ BACK BY AND VERIFIED WITH: JESICA B 8/31      @915  BY REAMM     Performed at [facility]   Report Status 11/11/2012 FINAL   Final   Organism ID, Bacteria METHICILLIN RESISTANT STAPHYLOCOCCUS AUREUS   Final     Studies: US Renal  11/11/2012   *RADIOLOGY REPORT*  Clinical Data: Renal failure  RENAL/URINARY TRACT ULTRASOUND COMPLETE  Comparison:  None.  Findings:  Right Kidney:  11.9 cm in length.  No mass lesion or hydronephrosis is noted.  Left Kidney:  13 cm in length.  No mass lesion or hydronephrosis is noted.  Bladder:  Partially distended.  IMPRESSION: No acute abnormality noted.   Original Report Authenticated By: Alcide Clever, M.D.    Scheduled Meds: . aspirin EC  81 mg Oral Daily  . budesonide-formoterol  2 puff Inhalation BID  . clindamycin  450 mg Oral Q8H  . enoxaparin (LOVENOX) injection  30 mg Subcutaneous Q24H  . furosemide  40 mg Intravenous BID  . insulin aspart  0-20 Units Subcutaneous TID WC  . insulin aspart  0-5 Units Subcutaneous QHS  . [START ON 11/14/2012] insulin detemir  12 Units Subcutaneous Daily  . polyethylene glycol  17 g Oral Daily  . potassium chloride  40 mEq Oral BID  . senna-docusate  2 tablet Oral BID   Continuous Infusions: . 0.45 % NaCl with KCl 20 mEq / L      Principal Problem:   Facial cellulitis Active Problems:    Hypertension   Leukocytosis   DM (diabetes mellitus)   Asthma   PVD (peripheral vascular disease)   Hyponatremia   Tachycardia   Nausea & vomiting   Unspecified constipation   Hypokalemia    Time spent: 30 minutes    [facility] M  [facility] Pager [phone]. If 7PM-7AM, please contact night-coverage at [email or web address], password [facility] 11/13/2012, 10:02 AM  LOS: 6 days

## 2012-11-14 DIAGNOSIS — I10 Essential (primary) hypertension: Secondary | ICD-10-CM

## 2012-11-14 LAB — BASIC METABOLIC PANEL
CO2: 23 mEq/L (ref 19–32)
Calcium: 9.3 mg/dL (ref 8.4–10.5)
Chloride: 101 mEq/L (ref 96–112)
Potassium: 4.6 mEq/L (ref 3.5–5.1)
Sodium: 136 mEq/L (ref 135–145)

## 2012-11-14 LAB — GLUCOSE, CAPILLARY: Glucose-Capillary: 153 mg/dL — ABNORMAL HIGH (ref 70–99)

## 2012-11-14 MED ORDER — FUROSEMIDE 40 MG PO TABS
40.0000 mg | ORAL_TABLET | Freq: Two times a day (BID) | ORAL | Status: DC
  Administered 2012-11-14: 40 mg via ORAL
  Filled 2012-11-14: qty 1

## 2012-11-14 MED ORDER — CLINDAMYCIN HCL 150 MG PO CAPS: 450.0000 mg | ORAL_CAPSULE | Freq: Three times a day (TID) | ORAL | Status: DC

## 2012-11-14 MED ORDER — INSULIN DETEMIR 100 UNIT/ML ~~LOC~~ SOLN: 12.0000 [IU] | Freq: Every day | SUBCUTANEOUS | Status: DC

## 2012-11-14 MED ORDER — AMLODIPINE BESYLATE 5 MG PO TABS: 5.0000 mg | ORAL_TABLET | Freq: Every day | ORAL | Status: DC

## 2012-11-14 NOTE — Progress Notes (Addendum)
IV removed, site WNL.  Pt given d/c instructions and new prescriptions.  Pt given gauze for dressings at home.  Area to L side of face is draining very small amt, patient able to demonstrate dressing change.  Patient has insulin starter pack and is able to use insulin pen confidently. Discussed home care with patient and discussed home medications, patient verbalizes understanding, teachback completed. F/U appointments in place, pt states they will keep appointments. Pt is stable at this time and desires to go home. Pt escorted to main entrance with staff, pt refused wheelchair, ambulated with steady gait.

## 2012-11-14 NOTE — Progress Notes (Signed)
UR chart review completed.  

## 2012-11-14 NOTE — Discharge Summary (Signed)
Physician Discharge Summary  Alexandria Webster VZD:[id number] DOB: 03-27-1965 DOA: 11/07/2012  PCP: Colette Ribas, MD  Admit date: 11/07/2012 Discharge date: 11/14/2012  Time spent: Greater than 30 minutes  Recommendations for Outpatient Follow-up:  1. Follow with Dr Fausto Skillern, nephrology in one week. 2. Followup with endocrinology, Dr. Fransico Him, in 2 weeks. (include homehealth, outpatient follow-up instructions, specific recommendations for PCP to follow-up on, etc.)  Discharge Diagnoses:  1. Left facial cellulitis/abscess, status post incision and drainage 10/31/2012. MRSA cultured, sensitive to clindamycin. 2. Acute renal failure secondary to likely vancomycin and possible Septra. Renal function stabilizing. 3. Hypertension, suboptimal control. Norvasc started as a new medication in view of acute renal failure. ARB discontinued. 4. Type 2 diabetes mellitus, uncontrolled. Patient has been started on insulin and will followup with endocrinology soon. 5. Asthma, stable.   Discharge Condition: Stable.  Diet recommendation: Carbohydrate modified diet.  Filed Weights   11/07/12 0849 11/07/12 1339  Weight: 74.39 kg (164 lb) 70.8 kg (156 lb 1.4 oz)    History of present illness:  This very pleasant 47 year old lady presented to the hospital with symptoms of left facial inflammation/pain. She believes this started with a spider bite. Please see initial history as outlined below: HPI: Alexandria Webster is a 47 y.o. female with a past medical history that includes hypertension, diabetes type 2 non-insulin-dependent, asthma, peripheral vascular disease status post stent 10 years ago presents to the emergency department with a chief complaint of left facial abscess. Information is obtained from the patient. She reports that she was here 2 days ago with a complaint of left facial swelling pain and warmth. She believes this started after a spider bite when she was camping at that time. She was  evaluated in the emergency department started on Septra DS and advised to apply warm moist compresses which she did. She indicates that the area continued to swell extending up to her I around to her ear and jaw. She states that last evening she became nauseated and had 2 episodes of vomiting. When she awakened this morning she remained nauseated and was unable to eat. In addition she reports that yesterday evening her dog began to lick her face in this area and that it drained a moderate amount of thick liquid. She indicates that the drainage stopped after a short period of time. She denies fever or chills. She denies difficulty chewing or swallowing. He denies headache dizziness syncope or near-syncope. Lab work in the emergency room is significant for a white count of 14.2 sodium of 132 chloride 93 creatinine 0.47 and glucose of 286. Vital signs are significant for a heart rate of 102. Symptoms came on suddenly have persisted and worsened characterized as moderate. We are asked to admit  Hospital Course:  Patient was hospitalized and started on intravenous Zosyn and intravenous vancomycin. She required incision and drainage by Dr. Leticia Penna, surgery. After this the infection seemed to improve significantly. Culture was positive for MRSA, which was sensitive to clindamycin. In the meantime, unfortunately she went into acute renal failure. She required nephrology consultation and IV fluids and she was on Lasix. Her renal function has been improving over the last few days and is stabilizing now. Her ARB was discontinued. Her blood pressure is increasing now and I started her on amlodipine. She is extremely keen to go home. She feels much improved. There is no fever. She is eating and drinking well. She's been started on insulin for her diabetes. She will follow with endocrinology  in the next couple weeks. She definitely needs to follow with nephrology in one week or so to monitor her renal  function.  Procedures:  None.  Consultations:  Nephrology, Dr Fausto Skillern.  Surgery, Dr. Leticia Penna.  Discharge Exam: Filed Vitals:   11/14/12 0500  BP: 153/85  Pulse: 79  Temp: 97.9 F (36.6 C)  Resp: 14    General: She looks systemically well. She is not toxic or septic. Cardiovascular: Heart sounds are present without gallop rhythm or murmurs. Respiratory: Lung fields are clear. She is alert and orientated. Left facial cellulitis is significantly improved from description when she came in. She confirms this. There is minimal erythema in the left lower face towards the midline.  Discharge Instructions  Discharge Orders   Future Orders Complete By Expires   Diet - low sodium heart healthy  As directed    Increase activity slowly  As directed        Medication List    STOP taking these medications       losartan 50 MG tablet  Commonly known as:  COZAAR     sitaGLIPtin-metformin 50-1000 MG per tablet  Commonly known as:  JANUMET     sulfamethoxazole-trimethoprim 800-160 MG per tablet  Commonly known as:  SEPTRA DS      TAKE these medications       amLODipine 5 MG tablet  Commonly known as:  NORVASC  Take 1 tablet (5 mg total) by mouth daily.     aspirin EC 81 MG tablet  Take 81 mg by mouth daily.     budesonide-formoterol 160-4.5 MCG/ACT inhaler  Commonly known as:  SYMBICORT  Inhale 2 puffs into the lungs 2 (two) times daily.     CLEAR EYES FOR DRY EYES OP  Place 2 drops into both eyes 2 (two) times daily.     clindamycin 150 MG capsule  Commonly known as:  CLEOCIN  Take 3 capsules (450 mg total) by mouth every 8 (eight) hours.     insulin detemir 100 UNIT/ML injection  Commonly known as:  LEVEMIR  Inject 0.12 mLs (12 Units total) into the skin daily.     oxyCODONE-acetaminophen 5-325 MG per tablet  Commonly known as:  PERCOCET  Take 2 tablets by mouth every 4 (four) hours as needed for pain.       No Known Allergies     Follow-up  Information   Follow up with [facility], MD. Schedule an appointment as soon as possible for a visit in 1 week.   Specialty:  Nephrology   Contact information:   [address] [place] [address] [phone]        The results of significant diagnostics from this hospitalization (including imaging, microbiology, ancillary and laboratory) are listed below for reference.    Significant Diagnostic Studies: US Renal  11/11/2012   *RADIOLOGY REPORT*  Clinical Data: Renal failure  RENAL/URINARY TRACT ULTRASOUND COMPLETE  Comparison:  None.  Findings:  Right Kidney:  11.9 cm in length.  No mass lesion or hydronephrosis is noted.  Left Kidney:  13 cm in length.  No mass lesion or hydronephrosis is noted.  Bladder:  Partially distended.  IMPRESSION: No acute abnormality noted.   Original Report Authenticated By: Alcide Clever, M.D.    Microbiology: Recent Results (from the past 240 hour(s))  CULTURE, ROUTINE-ABSCESS     Status: None   Collection Time    11/08/12 12:30 PM      Result Value Range Status  Specimen Description ABSCESS FACE   Final   Special Requests VANCOMYCIN AND ZOSYN   Final   Gram Stain     Final   Value: ABUNDANT WBC PRESENT, PREDOMINANTLY PMN     NO SQUAMOUS EPITHELIAL CELLS SEEN     FEW GRAM POSITIVE COCCI IN CLUSTERS     Performed at [facility]   Culture     Final   Value: MODERATE METHICILLIN RESISTANT STAPHYLOCOCCUS AUREUS     Note: RIFAMPIN AND GENTAMICIN SHOULD NOT BE USED AS SINGLE DRUGS FOR TREATMENT OF STAPH INFECTIONS. This organism DOES NOT demonstrate inducible Clindamycin resistance in vitro. CRITICAL RESULT CALLED TO, READ BACK BY AND VERIFIED WITH: JESICA B 8/31      @915  BY REAMM     Performed at [facility]   Report Status 11/11/2012 FINAL   Final   Organism ID, Bacteria METHICILLIN RESISTANT STAPHYLOCOCCUS AUREUS   Final     Labs: Basic Metabolic Panel:  Recent Labs Lab 11/10/12 0618 11/11/12 0449  11/12/12 0501 11/13/12 0509 11/14/12 0509  NA 140 136 139 137 136  K 3.3* 3.3* 3.2* 3.3* 4.6  CL 99 99 106 104 101  CO2 27 28 22 23 23   GLUCOSE 177* 149* 274* 270* 195*  BUN 10 13 15 15 16   CREATININE 1.27* 2.30* 2.80* 2.67* 2.69*  CALCIUM 10.2 9.4 8.6 8.8 9.3  MG  --   --  1.8  --   --   PHOS  --   --  5.5* 4.6  --    Liver Function Tests:  Recent Labs Lab 11/07/12 1003 11/12/12 0501  AST 12 15  ALT 11 12  ALKPHOS 90 62  BILITOT 0.4 <0.1*  PROT 8.3 6.1  ALBUMIN 3.5 2.4*     CBC:  Recent Labs Lab 11/07/12 1003 11/08/12 0543 11/09/12 0548 11/12/12 0501  WBC 14.2* 12.1* 9.5 7.6  NEUTROABS 11.8*  --   --   --   HGB 15.7* 14.0 14.0 12.0  HCT 45.7 40.0 40.4 35.0*  MCV 95.4 95.2 95.7 95.1  PLT 188 203 219 246   Cardiac Enzymes: No results found for this basename: CKTOTAL, CKMB, CKMBINDEX, TROPONINI,  in the last 168 hours BNP: BNP (last 3 results) No results found for this basename: PROBNP,  in the last 8760 hours CBG:  Recent Labs Lab 11/13/12 0730 11/13/12 1140 11/13/12 1701 11/13/12 2111 11/14/12 0739  GLUCAP 273* 350* 109* 211* 153*       Signed:  GOSRANI,NIMISH C  [facility] 11/14/2012, 9:47 AM

## 2012-11-14 NOTE — Progress Notes (Signed)
Unable to gain IV access x 2 attempts and pt requests she not be stuck again due to multiple attempts for previous access.  Dr. Sharl Ma notified via phone and order received to leave IV out.  Pt encouraged to drink water frequently throughout the night.  Nursing staff to continue to monitor.

## 2013-04-11 ENCOUNTER — Emergency Department (HOSPITAL_COMMUNITY)
Admission: EM | Admit: 2013-04-11 | Discharge: 2013-04-11 | Disposition: A | Discharge status: Home / Self Care | Payer: 59 | Attending: Emergency Medicine | Admitting: Emergency Medicine

## 2013-04-11 ENCOUNTER — Emergency Department (HOSPITAL_COMMUNITY): Payer: 59

## 2013-04-11 ENCOUNTER — Encounter (HOSPITAL_COMMUNITY): Payer: Self-pay | Admitting: Emergency Medicine

## 2013-04-11 DIAGNOSIS — K5289 Other specified noninfective gastroenteritis and colitis: Secondary | ICD-10-CM | POA: Insufficient documentation

## 2013-04-11 DIAGNOSIS — K529 Noninfective gastroenteritis and colitis, unspecified: Secondary | ICD-10-CM

## 2013-04-11 DIAGNOSIS — I10 Essential (primary) hypertension: Secondary | ICD-10-CM | POA: Insufficient documentation

## 2013-04-11 DIAGNOSIS — M545 Low back pain, unspecified: Secondary | ICD-10-CM | POA: Insufficient documentation

## 2013-04-11 DIAGNOSIS — J45909 Unspecified asthma, uncomplicated: Secondary | ICD-10-CM | POA: Insufficient documentation

## 2013-04-11 DIAGNOSIS — F17200 Nicotine dependence, unspecified, uncomplicated: Secondary | ICD-10-CM | POA: Insufficient documentation

## 2013-04-11 DIAGNOSIS — J029 Acute pharyngitis, unspecified: Secondary | ICD-10-CM | POA: Insufficient documentation

## 2013-04-11 DIAGNOSIS — Z794 Long term (current) use of insulin: Secondary | ICD-10-CM | POA: Insufficient documentation

## 2013-04-11 DIAGNOSIS — J329 Chronic sinusitis, unspecified: Secondary | ICD-10-CM | POA: Insufficient documentation

## 2013-04-11 DIAGNOSIS — Z9861 Coronary angioplasty status: Secondary | ICD-10-CM | POA: Insufficient documentation

## 2013-04-11 DIAGNOSIS — Z7982 Long term (current) use of aspirin: Secondary | ICD-10-CM | POA: Insufficient documentation

## 2013-04-11 DIAGNOSIS — Z760 Encounter for issue of repeat prescription: Secondary | ICD-10-CM

## 2013-04-11 DIAGNOSIS — Z3202 Encounter for pregnancy test, result negative: Secondary | ICD-10-CM | POA: Insufficient documentation

## 2013-04-11 DIAGNOSIS — Z951 Presence of aortocoronary bypass graft: Secondary | ICD-10-CM | POA: Insufficient documentation

## 2013-04-11 DIAGNOSIS — E119 Type 2 diabetes mellitus without complications: Secondary | ICD-10-CM | POA: Insufficient documentation

## 2013-04-11 DIAGNOSIS — Z79899 Other long term (current) drug therapy: Secondary | ICD-10-CM | POA: Insufficient documentation

## 2013-04-11 HISTORY — DX: Other intervertebral disc degeneration, lumbar region: M51.36

## 2013-04-11 HISTORY — DX: Other intervertebral disc degeneration, lumbar region without mention of lumbar back pain or lower extremity pain: M51.369

## 2013-04-11 LAB — COMPREHENSIVE METABOLIC PANEL
ALBUMIN: 3.7 g/dL (ref 3.5–5.2)
ALK PHOS: 70 U/L (ref 39–117)
ALT: 14 U/L (ref 0–35)
AST: 13 U/L (ref 0–37)
BILIRUBIN TOTAL: 0.5 mg/dL (ref 0.3–1.2)
BUN: 6 mg/dL (ref 6–23)
CHLORIDE: 97 meq/L (ref 96–112)
CO2: 20 mEq/L (ref 19–32)
Calcium: 8.8 mg/dL (ref 8.4–10.5)
Creatinine, Ser: 0.62 mg/dL (ref 0.50–1.10)
GFR calc Af Amer: >90 mL/min (ref >90)
GFR calc non Af Amer: >90 mL/min (ref >90)
Glucose, Bld: 274 mg/dL — ABNORMAL HIGH (ref 70–99)
POTASSIUM: 3.1 meq/L — AB (ref 3.7–5.3)
SODIUM: 135 meq/L — AB (ref 137–147)
TOTAL PROTEIN: 7.8 g/dL (ref 6.0–8.3)

## 2013-04-11 LAB — URINALYSIS, ROUTINE W REFLEX MICROSCOPIC
Glucose, UA: 500 mg/dL — AB
Ketones, ur: 40 mg/dL — AB
Leukocytes, UA: NEGATIVE
Nitrite: NEGATIVE
PH: 6 (ref 5.0–8.0)
Protein, ur: 30 mg/dL — AB
Urobilinogen, UA: 0.2 mg/dL (ref 0.0–1.0)

## 2013-04-11 LAB — RAPID STREP SCREEN (MED CTR MEBANE ONLY): Streptococcus, Group A Screen (Direct): NEGATIVE

## 2013-04-11 LAB — CBC WITH DIFFERENTIAL/PLATELET
BASOS ABS: 0 10*3/uL (ref 0.0–0.1)
BASOS PCT: 0 % (ref 0–1)
EOS ABS: 0 10*3/uL (ref 0.0–0.7)
EOS PCT: 0 % (ref 0–5)
HEMATOCRIT: 42.5 % (ref 36.0–46.0)
Hemoglobin: 15.3 g/dL — ABNORMAL HIGH (ref 12.0–15.0)
LYMPHS ABS: 2.1 10*3/uL (ref 0.7–4.0)
LYMPHS PCT: 25 % (ref 12–46)
MCH: 32.6 pg (ref 26.0–34.0)
MCHC: 36 g/dL (ref 30.0–36.0)
MCV: 90.4 fL (ref 78.0–100.0)
MONO ABS: 0.8 10*3/uL (ref 0.1–1.0)
MONOS PCT: 9 % (ref 3–12)
Neutro Abs: 5.5 10*3/uL (ref 1.7–7.7)
Neutrophils Relative %: 66 % (ref 43–77)
Platelets: 196 10*3/uL (ref 150–400)
RBC: 4.7 MIL/uL (ref 3.87–5.11)
RDW: 12.1 % (ref 11.5–15.5)
WBC: 8.3 10*3/uL (ref 4.0–10.5)

## 2013-04-11 LAB — PREGNANCY, URINE: Preg Test, Ur: NEGATIVE

## 2013-04-11 LAB — URINE MICROSCOPIC-ADD ON

## 2013-04-11 LAB — GLUCOSE, CAPILLARY: Glucose-Capillary: 192 mg/dL — ABNORMAL HIGH (ref 70–99)

## 2013-04-11 LAB — LIPASE, BLOOD: LIPASE: 25 U/L (ref 11–59)

## 2013-04-11 MED ORDER — SODIUM CHLORIDE 0.9 % IV SOLN: INTRAVENOUS | Status: DC

## 2013-04-11 MED ORDER — SODIUM CHLORIDE 0.9 % IV BOLUS (SEPSIS)
1000.0000 mL | Freq: Once | INTRAVENOUS | Status: AC
  Administered 2013-04-11: 1000 mL via INTRAVENOUS

## 2013-04-11 MED ORDER — METRONIDAZOLE 500 MG PO TABS
500.0000 mg | ORAL_TABLET | Freq: Three times a day (TID) | ORAL | Status: DC
Start: 2013-04-11 — End: 2013-09-24

## 2013-04-11 MED ORDER — CIPROFLOXACIN HCL 500 MG PO TABS: 500.0000 mg | ORAL_TABLET | Freq: Two times a day (BID) | ORAL | Status: DC

## 2013-04-11 MED ORDER — POTASSIUM CHLORIDE 20 MEQ/15ML (10%) PO LIQD
40.0000 meq | Freq: Once | ORAL | Status: AC
  Administered 2013-04-11: 40 meq via ORAL
  Filled 2013-04-11: qty 30

## 2013-04-11 MED ORDER — ALBUTEROL SULFATE HFA 108 (90 BASE) MCG/ACT IN AERS: 2.0000 | INHALATION_SPRAY | RESPIRATORY_TRACT | Status: DC | PRN

## 2013-04-11 NOTE — ED Notes (Signed)
Patient states she is feeling better after IV fluids being administered.

## 2013-04-11 NOTE — Discharge Instructions (Signed)
°Emergency Department Resource Guide °1) Find a Doctor and Pay Out of Pocket °Although you won't have to find out who is covered by your insurance plan, it is a good idea to ask around and get recommendations. You will then need to call the office and see if the doctor you have chosen will accept you as a new patient and what types of options they offer for patients who are self-pay. Some doctors offer discounts or will set up payment plans for their patients who do not have insurance, but you will need to ask so you aren't surprised when you get to your appointment. ° °2) Contact Your Local Health Department °Not all health departments have doctors that can see patients for sick visits, but many do, so it is worth a call to see if yours does. If you don't know where your local health department is, you can check in your phone book. The CDC also has a tool to help you locate your state's health department, and many state websites also have listings of all of their local health departments. ° °3) Find a Walk-in Clinic °If your illness is not likely to be very severe or complicated, you may want to try a walk in clinic. These are popping up all over the country in pharmacies, drugstores, and shopping centers. They're usually staffed by nurse practitioners or physician assistants that have been trained to treat common illnesses and complaints. They're usually fairly quick and inexpensive. However, if you have serious medical issues or chronic medical problems, these are probably not your best option. ° °No Primary Care Doctor: °- Call [facility] at  832-8000 - they can help you locate a primary care doctor that  accepts your insurance, provides certain services, etc. °- Physician Referral Service- 1-800-533-3463 ° °Chronic Pain Problems: °Organization         Address  Phone   Notes  °[facility]  (336) [phone] Patients need to be referred by their primary care doctor.  ° °Medication  Assistance: °Organization         Address  Phone   Notes  °[place] [facility] [address] °[place], [place] [address] (336) 641-8030 --Must be a resident of [place] °-- Must have NO insurance coverage whatsoever (no Medicaid/ Medicare, etc.) °-- The pt. MUST have a primary care doctor that directs their care regularly and follows them in the community °  °[facility]  (866) 331-1348   °[employer]  (888) 892-1162   ° °Agencies that provide inexpensive medical care: °Organization         Address  Phone   Notes  °[facility]  (336) 832-8035   °[facility]    (336) 832-7272   °[facility] [address] °[place], [place] [address] (336) 832-4777   °[facility] [address], °[place] (336) 271-4999   °[facility]    (336) 373-0678   °[facility]    (336) 272-1050   °[facility] ° [address], [place] Phone:  (336) 832-4444, Fax:  (336) 832-4440 Hours of Operation:  9 am - 6 pm, M-F.  Also accepts Medicaid/Medicare and self-pay.  °[facility] ° [address], [place] Phone: (336) [phone], Fax: (336) [phone]. Hours of Operation:  8:30 am - 5:30 pm, M-F.  Also accepts Medicaid and self-pay.  °[facility] [place] [address], [place] Phone: (336) 878-6027   °[facility] [address], [place], [place] (336)723-1848, Ext. 123 Mondays & Thursdays: 7-9 AM.  First 15 patients are seen on a first come, first serve basis. °  ° °Medicaid-accepting [place] Providers: ° °Organization         Address  Phone   Notes  °[facility] [address], [place] (336) 641-2100 Also accepts self-pay patients.  °[facility] [address], [place] ° (336) 856-9996   °[facility] [address], [place]  (336) 288-8857   °[facility] [address] [place] [address], [place] (336) 299-7000   °Veita Bland [address], [place]  ° (336) 373-1557 Only accepts [place] Access Medicaid patients after they have their name applied to their card.  ° °Self-Pay (no insurance) in [place]: ° °Organization         Address  Phone   Notes  °Sickle Cell Patients, [facility] [address], [place] (336) 832-1970   °[facility] [address], [place] (336) 832-4400   °[facility] [place] ° [address], [place] (336) 992-4800   °[facility]/Dr. Osei-Bonsu ° [address] [place] [address], [place] or [address], [place] (336) 841-8500 Phone number for both [place] and [place] locations is the same.  °[facility] [address], [place] (336) 299-0000   °[facility] [address] [place] [address], [place] or [address] (336) 852-7530 °(336) 878-2260   °[facility] [address], [place] (336) 350-1642, phone; (336) 294-5005, fax Sees patients 1st and 3rd Saturday of every month.  Must not qualify for public or private insurance (i.e. Medicaid, Medicare, [place] Health Choice, Veterans' Benefits) • Household income should be no more than 200% of the poverty level •The clinic cannot treat you if you are pregnant or think you are pregnant • Sexually transmitted diseases are not treated at the clinic.  ° ° °Dental Care: °Organization         Address  Phone  Notes  °[place] [facility] [address], [place] (336) 641-6152 Accepts children up to age 21 who are enrolled in Medicaid or [place] Health Choice; pregnant women with a Medicaid card; and children who have applied for Medicaid or [place] Health Choice, but were declined, whose parents can pay a reduced fee at time of service.  °[place]  [facility] [place]  [address], [place] (336) 641-7733 Accepts children up to age 21 who are enrolled in Medicaid or [place] Health Choice; pregnant women with a Medicaid card; and children who have applied for Medicaid or  Health Choice, but were declined, whose parents can pay a reduced fee at time of service.  °[facility] ° [address], [place] (336) 641-4533 Patients are seen by appointment only. Walk-ins are not accepted. [facility] will see patients 18 years of age and older. °Monday - Tuesday (8am-5pm) °Most Wednesdays (8:30-5pm) °$30 per visit, cash only  °[facility] ° [address], [place] (336) 641-4533 Patients are seen by appointment only. Walk-ins are not accepted. [facility] will see patients 18 years of age and older. °One   Wednesday Evening (Monthly: Volunteer Based).  $30 per visit, cash only  °[facility]  (919) [phone] for adults; Children under age 4, call Graduate Pediatric Dentistry at ([phone]) [phone]. Children aged 4-14, please call ([phone]) [phone] to request a pediatric application. ° Dental services are provided in all areas of dental care including fillings, crowns and bridges, complete and partial dentures, implants, gum treatment, root canals, and extractions. Preventive care is also provided. Treatment is provided to both adults and children. °Patients are selected via a lottery and there is often a waiting list. °  °[facility] [address], °[place] ° (336) 763-8833 [email or web address] °  °[facility] [address], [place] (336)723-1848, Ext. 123 Second and Fourth Thursday of each month, opens at 6:30 AM; Clinic ends at 9 AM.  Patients are seen on a first-come first-served basis, and a limited number are seen during each clinic.  ° °[facility] ° [address], [place], [place] (336) 723-7904    Eligibility Requirements °You must have lived in [place], [place], or [place] counties for at least the last three months. °  You cannot be eligible for state or federal sponsored healthcare insurance, including Veterans Administration, Medicaid, or Medicare. °  You generally cannot be eligible for healthcare insurance through your employer.  °  How to apply: °Eligibility screenings are held every Tuesday and Wednesday afternoon from 1:00 pm until 4:00 pm. You do not need an appointment for the interview!  °[facility] [address], [place], [place] 336-631-2330   °[facility]  336-342-8273   °[place]  336-703-3100   ° [facility]  336-570-6415   ° °Behavioral Health Resources in the Community: °Intensive Outpatient Programs °Organization         Address  Phone  Notes  °[facility] [address], [place], [place] 336-878-6098   °[facility] [address], [place], [place] 336-832-9800   °[facility] [address], [place], [place] ° 336-882-2125   °[facility] [address],  °[place], [place] 1-800-853-5163 or 336-641-4981   °Substance Abuse Resources °Organization         Address  Phone  Notes  °[facility]  336-882-2125   °[facility]  336-784-9470   °[facility]  336-285-9073   °[facility]  336-845-3988   °Residential & Outpatient Substance Abuse Program  1-800-659-3381   °Psychological Services °Organization         Address  Phone  Notes  °[facility]  336- 832-9600   °[facility]  336- 378-7881   °[facility] [address], [place] 1-800-853-5163 or 336-641-4981   ° °Mobile Crisis Teams °Organization         Address  Phone  Notes  °[facility], Mobile Crisis Care Unit  1-877-626-1772   °Assertive °[facility] ° [address]  [place], [place] 336-834-9664   °Sharon DeEsch [address] °[place] 336-554-5454   ° °Self-Help/Support Groups °Organization         Address  Phone             Notes  °[facility] [place] - variety of support groups  336- 373-1402 Call for more information  °Narcotics Anonymous (NA), [facility] [address], °[place]   2 meetings at this location  ° °  Residential Treatment Programs Organization         Address  Phone  Notes  [facility] [address],    [place]  1-(863) 787-8120   [facility]  [address], [place] [address], [place], [place]   [facility] [place], [place] 671-203-3780 Admissions: 8am-3pm M-F  [facility] [address]    [place], [place] 546-270-3500   [facility] [address] [place], [place], [place]   [facility] [address]  [place], [place]   [facility] - Intensive Outpatient [address], [place], [place]   [facility] [address]  [place], [place] 1-765 127 4922 or 937-449-9334   [facility] [address], [place], [place] Accepts Medicaid  [facility] [address],  [place] 1-(203) 327-7788 Substance Abuse/Addiction Treatment   [facility]         Address  Phone  Notes  [facility]  204-427-5526   Domenic Schwab, PhD [address] [place], [place]   647-546-3705 or (801)270-9228   [facility] [place], [place] 609-642-6902   [facility] [address] [place], [place] (303) [phone] Insurance/Medicaid/sponsorship through [facility] [address]                                    [place], [place] [phone] [facility] [address], [place] [phone]    Dr. Adele Schilder  ([phone]   [facility] Dept. 1) [address] [place] 2) [place] 3)  [place] [address], [place] [phone]  [phone]   [place] [phone] or ([phone] (After Hours)      Take the prescriptions as directed.  Increase your fluid intake (ie:  Gatoraide) for the next few days, as discussed.  Eat a bland diet and advance to your regular diet slowly as you can tolerate it.   Avoid full strength juices, as well as milk and milk products until your diarrhea has resolved. Take over the counter decongestant (such as sudafed), as directed on packaging, for the next week.  Use over the counter normal saline nasal spray, as instructed in the Emergency Department, several times per day for the next 2 weeks. Take over the counter tylenol and ibuprofen, as directed on packaging, as needed for discomfort.  Gargle with warm water several times per day to help with discomfort.  May also use over the counter sore throat pain medicines such as chloraseptic or sucrets, as directed on packaging, as needed for discomfort. Call your regular medical doctor tomorrow to schedule a follow up appointment this week.  Return to the Emergency Department immediately if not improving (or even worsening) despite taking the medicines as prescribed, any black or bloody stool or vomit, if you develop a fever over "101," or for any other concerns.

## 2013-04-11 NOTE — ED Notes (Signed)
Pt resting w/ eyes closed, rise & fall of the chest noted. IV fluids still infusing, NAD.

## 2013-04-11 NOTE — ED Provider Notes (Signed)
CSN: [id number]     Arrival date & time 04/11/13  1558 History   First MD Initiated Contact with Patient 04/11/13 1941     Chief Complaint  Patient presents with  . Flank Pain  . Sinus Problem  . Diarrhea  . Sore Throat    HPI Pt was seen at [place].  Per pt, c/o gradual onset and persistence of constant sore throat, runny/stuffy nose, sinus congestion, "chills," and generalized weakness/fatigue for the past 2-3 days.  Has been associated with right sided low back "pain" and several episodes of diarrhea. Denies fevers, no rash, no CP/SOB, no N/V, no abd pain, no black or blood in stools.    Past Medical History  Diagnosis Date  . Asthma   . Diabetes mellitus   . Hypertension   . PVD (peripheral vascular disease)   . DDD (degenerative disc disease), lumbar    Past Surgical History  Procedure Laterality Date  . Femoral artery stent  right leg  . Back surgery    . Coronary angioplasty with stent placement    . Coronary artery bypass graft     Family History  Problem Relation Age of Onset  . Stroke Mother   . Hypertension Mother   . Heart failure Father    History  Substance Use Topics  . Smoking status: Current Every Day Smoker -- 0.50 packs/day for 30 years    Types: Cigarettes  . Smokeless tobacco: Never Used  . Alcohol Use: No   OB History   Grav Para Term Preterm Abortions TAB SAB Ect Mult Living   2 1  1 1  1   1      Review of Systems ROS: Statement: All systems negative except as marked or noted in the HPI; Constitutional: Negative for fever and +chills, generalized weakness/fatigue.. ; ; Eyes: Negative for eye pain, redness and discharge. ; ; ENMT: Negative for ear pain, hoarseness, +rhinorrhea, nasal congestion, sinus pressure and sore throat. ; ; Cardiovascular: Negative for chest pain, palpitations, diaphoresis, dyspnea and peripheral edema. ; ; Respiratory: Negative for cough, wheezing and stridor. ; ; Gastrointestinal: +diarrhea. Negative for nausea, vomiting,  abdominal pain, blood in stool, hematemesis, jaundice and rectal bleeding. . ; ; Genitourinary: Negative for dysuria and hematuria. ; ; Musculoskeletal: +LBP. Negative for neck pain. Negative for swelling and trauma.; ; Skin: Negative for pruritus, rash, abrasions, blisters, bruising and skin lesion.; ; Neuro: Negative for headache, lightheadedness and neck stiffness. Negative for altered level of consciousness , altered mental status, extremity weakness, paresthesias, involuntary movement, seizure and syncope.      Allergies  Review of patient's allergies indicates no known allergies.  Home Medications   Current Outpatient Rx  Name  Route  Sig  Dispense  Refill  . amLODipine (NORVASC) 5 MG tablet   Oral   Take 1 tablet (5 mg total) by mouth daily.   30 tablet   0   . aspirin EC 81 MG tablet   Oral   Take 81 mg by mouth daily.         . budesonide-formoterol (SYMBICORT) 160-4.5 MCG/ACT inhaler   Inhalation   Inhale 2 puffs into the lungs 2 (two) times daily.         . Carboxymethylcellul-Glycerin (CLEAR EYES FOR DRY EYES OP)   Both Eyes   Place 2 drops into both eyes 2 (two) times daily.         . insulin detemir (LEVEMIR) 100 UNIT/ML injection   Subcutaneous  Inject 15 Units into the skin at bedtime.          BP 127/43  Pulse 51  Temp(Src) 99.7 F (37.6 C) (Oral)  Resp 20  Ht 5\' 4"  (1.626 m)  Wt 168 lb (76.204 kg)  BMI 28.82 kg/m2  SpO2 98% Filed Vitals:   04/11/13 1949 04/11/13 1951 04/11/13 1954 04/11/13 2149  BP: 130/86 121/67 122/67 127/43  Pulse: 99 108 117 51  Temp:    99.7 F (37.6 C)  TempSrc:    Oral  Resp:    20  Height:      Weight:      SpO2:    98%    Physical Exam 1950: Physical examination:  Nursing notes reviewed; Vital signs and O2 SAT reviewed;  Constitutional: Well developed, Well nourished, In no acute distress; Head:  Normocephalic, atraumatic; Eyes: EOMI, PERRL, No scleral icterus; ENMT: TM's clear bilat. +edemetous nasal  turbinates bilat with clear rhinorrhea. Mouth and pharynx without lesions. No tonsillar exudates. No intra-oral edema. No submandibular or sublingual edema. No hoarse voice, no drooling, no stridor. No pain with manipulation of larynx.  Mouth and pharynx normal, Mucous membranes dry;; Neck: Supple, Full range of motion, No lymphadenopathy; Cardiovascular: Regular rate and rhythm, No gallop; Respiratory: Breath sounds clear & equal bilaterally, No rales, rhonchi, wheezes.  Speaking full sentences with ease, Normal respiratory effort/excursion; Chest: Nontender, Movement normal; Abdomen: Soft, Nontender, Nondistended, Normal bowel sounds; Genitourinary: No CVA tenderness; Spine:  No midline CS, TS, LS tenderness. +mild TTP right lumbar paraspinal muscles. No rash;; Extremities: Pulses normal, No tenderness, No edema, No calf edema or asymmetry.; Neuro: AA&Ox3, Major CN grossly intact.  Speech clear. No gross focal motor or sensory deficits in extremities. Climbs on and off stretcher easily by herself. Gait steady.; Skin: Color normal, Warm, Dry.   ED Course  Procedures  EKG Interpretation   None       MDM  MDM Reviewed: previous chart, nursing note and vitals Reviewed previous: labs Interpretation: labs and CT scan     Results for orders placed during the hospital encounter of 04/11/13  URINALYSIS, ROUTINE W REFLEX MICROSCOPIC      Result Value Range   Color, Urine YELLOW  YELLOW   APPearance CLEAR  CLEAR   Specific Gravity, Urine >1.030 (*) 1.005 - 1.030   pH 6.0  5.0 - 8.0   Glucose, UA 500 (*) NEGATIVE mg/dL   Hgb urine dipstick TRACE (*) NEGATIVE   Bilirubin Urine SMALL (*) NEGATIVE   Ketones, ur 40 (*) NEGATIVE mg/dL   Protein, ur 30 (*) NEGATIVE mg/dL   Urobilinogen, UA 0.2  0.0 - 1.0 mg/dL   Nitrite NEGATIVE  NEGATIVE   Leukocytes, UA NEGATIVE  NEGATIVE  PREGNANCY, URINE      Result Value Range   Preg Test, Ur NEGATIVE  NEGATIVE  CBC WITH DIFFERENTIAL      Result Value  Range   WBC 8.3  4.0 - 10.5 K/uL   RBC 4.70  3.87 - 5.11 MIL/uL   Hemoglobin 15.3 (*) 12.0 - 15.0 g/dL   HCT 42.5  36.0 - 46.0 %   MCV 90.4  78.0 - 100.0 fL   MCH 32.6  26.0 - 34.0 pg   MCHC 36.0  30.0 - 36.0 g/dL   RDW 12.1  11.5 - 15.5 %   Platelets 196  150 - 400 K/uL   Neutrophils Relative % 66  43 - 77 %   Neutro Abs 5.5  1.7 -  7.7 K/uL   Lymphocytes Relative 25  12 - 46 %   Lymphs Abs 2.1  0.7 - 4.0 K/uL   Monocytes Relative 9  3 - 12 %   Monocytes Absolute 0.8  0.1 - 1.0 K/uL   Eosinophils Relative 0  0 - 5 %   Eosinophils Absolute 0.0  0.0 - 0.7 K/uL   Basophils Relative 0  0 - 1 %   Basophils Absolute 0.0  0.0 - 0.1 K/uL  COMPREHENSIVE METABOLIC PANEL      Result Value Range   Sodium 135 (*) 137 - 147 mEq/L   Potassium 3.1 (*) 3.7 - 5.3 mEq/L   Chloride 97  96 - 112 mEq/L   CO2 20  19 - 32 mEq/L   Glucose, Bld 274 (*) 70 - 99 mg/dL   BUN 6  6 - 23 mg/dL   Creatinine, Ser 0.62  0.50 - 1.10 mg/dL   Calcium 8.8  8.4 - 10.5 mg/dL   Total Protein 7.8  6.0 - 8.3 g/dL   Albumin 3.7  3.5 - 5.2 g/dL   AST 13  0 - 37 U/L   ALT 14  0 - 35 U/L   Alkaline Phosphatase 70  39 - 117 U/L   Total Bilirubin 0.5  0.3 - 1.2 mg/dL   GFR calc non Af Amer >90  >90 mL/min   GFR calc Af Amer >90  >90 mL/min  LIPASE, BLOOD      Result Value Range   Lipase 25  11 - 59 U/L  URINE MICROSCOPIC-ADD ON      Result Value Range   Squamous Epithelial / LPF FEW (*) RARE   WBC, UA 0-2  <3 WBC/hpf   RBC / HPF 0-2  <3 RBC/hpf   Bacteria, UA FEW (*) RARE   Urine-Other MUCOUS PRESENT     Ct Abdomen Pelvis Wo Contrast 04/11/2013   CLINICAL DATA:  Right-sided flank pain, fever, diarrhea, history of diabetes, peripheral vascular disease, CABG and lumbar surgery.  EXAM: CT ABDOMEN AND PELVIS WITHOUT CONTRAST  TECHNIQUE: Multidetector CT imaging of the abdomen and pelvis was performed following the standard protocol without intravenous contrast.  COMPARISON:  US RENAL dated 11/11/2012; MR ABDOMEN WO/W CM  dated 05/01/2012; CT ABD - PELV W/ CM dated 01/22/2012  FINDINGS: Normal hepatic contour. Normal noncontrast appearance of the gallbladder. No radiopaque gallstones. No ascites.  Normal noncontrast appearance of the bilateral kidneys. No renal stones. No renal stones are seen along the expected course of either ureter or the urinary bladder. Normal noncontrast appearance of the urinary bladder given degree of distention there is mild thickening of the crux of the left adrenal gland without discrete nodule. Normal appearance of the right adrenal gland. Normal appearance of the pancreas and spleen.  There is mild diffuse apparent wall thickening involving the colon, most conspicuously involving the cecum with associated very minimal amount of adjacent mesenteric stranding (representative axial images 39 and 42, series 3). There is a moderate to large amount of predominantly liquid stool throughout the colon. Normal noncontrast appearance of the appendix. No evidence of enteric obstruction. No pneumoperitoneum, pneumatosis or portal venous gas.  The patient is post aortobifemoral bypass with an abandoned stent within the caudal aspect of the native abdominal aorta. The patency of the aortic bypass graft is not evaluated on this noncontrast examination. Scattered shotty retroperitoneal lymph nodes are individually not enlarged by size criteria. No retroperitoneal, pelvic or inguinal lymphadenopathy.  There is a minimal  amount of presumably physiologic fluid within the endometrial canal. There is a suspected approximately 3.1 x 2.4 cm presumably physiologic left-sided adnexal cysts, incompletely evaluated on this noncontrast examination. No discrete right-sided adnexal lesion. No free fluid within the pelvis.  Limited visualization of the lower thorax is negative for focal airspace opacity or pleural effusion. Normal heart size. No pericardial effusion.  No acute or aggressive osseous abnormalities. Mild to moderate DDD  throughout the lumbar spine, likely worse at L3-L4 and L4-L5 with disc space height loss, endplate irregularity and posteriorly directed disc osteophyte complexes at these locations. Ex vacuo disc phenomenon is noted at multiple intervertebral disc spaces with presumed extension of this intra disc air into the left L3-L4 neural foramina (sagittal image 65, series 6; axial image 38, series 3).  Calcifications about the right buttocks.  IMPRESSION: 1. Findings most suggestive of an enteritis most severely involving the cecum. Normal noncontrast appearance of the appendix. 2. Moderate volume predominantly liquid stool throughout the colon without evidence of enteric obstruction. 3. Normal noncontrast appearance of the bilateral kidneys. No evidence of nephrolithiasis or urinary obstruction. 4. Post aortobifemoral graft, the patency of which is not evaluated on this noncontrast examination. 5. Mild to moderate DDD throughout the lumbar spine.   Electronically Signed   By: Sandi Mariscal M.D.   On: 04/11/2013 21:44    2205:  Remains afebrile. Pt tachycardic on orthostatic VS, SG urine elevated; will dose IVF. Will need VS and CBG checked after IVF. Rapid strep pending. CT scan with enteritis; will need abx. Will replete potassium PO. Sign out to Dr. Vanita Panda.     Alfonzo Feller, DO 04/11/13 2205

## 2013-04-11 NOTE — ED Notes (Signed)
Pain rt flank, fever, diarrhea, feels weak,  No vomiting.

## 2013-04-11 NOTE — ED Notes (Signed)
Pt alert & oriented x4, stable gait. Patient given discharge instructions, paperwork & prescription(s). Patient  instructed to stop at the registration desk to finish any additional paperwork. Patient verbalized understanding. Pt left department w/ no further questions. 

## 2013-04-14 LAB — CULTURE, GROUP A STREP

## 2013-09-24 ENCOUNTER — Encounter (HOSPITAL_COMMUNITY): Payer: Self-pay | Admitting: Emergency Medicine

## 2013-09-24 ENCOUNTER — Emergency Department (HOSPITAL_COMMUNITY)
Admission: EM | Admit: 2013-09-24 | Discharge: 2013-09-24 | Disposition: A | Discharge status: Home / Self Care | Payer: 59 | Attending: Emergency Medicine | Admitting: Emergency Medicine

## 2013-09-24 DIAGNOSIS — Z794 Long term (current) use of insulin: Secondary | ICD-10-CM | POA: Insufficient documentation

## 2013-09-24 DIAGNOSIS — R059 Cough, unspecified: Secondary | ICD-10-CM | POA: Insufficient documentation

## 2013-09-24 DIAGNOSIS — M5137 Other intervertebral disc degeneration, lumbosacral region: Secondary | ICD-10-CM | POA: Insufficient documentation

## 2013-09-24 DIAGNOSIS — H6192 Disorder of left external ear, unspecified: Secondary | ICD-10-CM

## 2013-09-24 DIAGNOSIS — H61899 Other specified disorders of external ear, unspecified ear: Secondary | ICD-10-CM | POA: Insufficient documentation

## 2013-09-24 DIAGNOSIS — Z7982 Long term (current) use of aspirin: Secondary | ICD-10-CM | POA: Insufficient documentation

## 2013-09-24 DIAGNOSIS — J45909 Unspecified asthma, uncomplicated: Secondary | ICD-10-CM | POA: Insufficient documentation

## 2013-09-24 DIAGNOSIS — Z79899 Other long term (current) drug therapy: Secondary | ICD-10-CM | POA: Insufficient documentation

## 2013-09-24 DIAGNOSIS — E119 Type 2 diabetes mellitus without complications: Secondary | ICD-10-CM | POA: Insufficient documentation

## 2013-09-24 DIAGNOSIS — I10 Essential (primary) hypertension: Secondary | ICD-10-CM | POA: Insufficient documentation

## 2013-09-24 DIAGNOSIS — R05 Cough: Secondary | ICD-10-CM | POA: Insufficient documentation

## 2013-09-24 DIAGNOSIS — F17200 Nicotine dependence, unspecified, uncomplicated: Secondary | ICD-10-CM | POA: Insufficient documentation

## 2013-09-24 DIAGNOSIS — J3489 Other specified disorders of nose and nasal sinuses: Secondary | ICD-10-CM | POA: Insufficient documentation

## 2013-09-24 DIAGNOSIS — M51379 Other intervertebral disc degeneration, lumbosacral region without mention of lumbar back pain or lower extremity pain: Secondary | ICD-10-CM | POA: Insufficient documentation

## 2013-09-24 DIAGNOSIS — I739 Peripheral vascular disease, unspecified: Secondary | ICD-10-CM | POA: Insufficient documentation

## 2013-09-24 MED ORDER — NEOMYCIN-POLYMYXIN-HC 1 % OT SOLN
3.0000 [drp] | Freq: Once | OTIC | Status: AC
  Administered 2013-09-24: 3 [drp] via OTIC
  Filled 2013-09-24: qty 10

## 2013-09-24 MED ORDER — ANTIPYRINE-BENZOCAINE 5.4-1.4 % OT SOLN
3.0000 [drp] | Freq: Once | OTIC | Status: AC
  Administered 2013-09-24: 3 [drp] via OTIC
  Filled 2013-09-24: qty 10

## 2013-09-24 NOTE — Discharge Instructions (Signed)
You have an infected ear papule (pimple).  Apply the antibiotic drops (cortisporin) 3 drops 3 times daily for 7 days.  You may apply the auralgan for pain relief - 1-2 drops every 4 hours as needed.

## 2013-09-24 NOTE — ED Notes (Signed)
Complain of pain in left ear since Saturday.

## 2013-09-24 NOTE — ED Provider Notes (Signed)
CSN: [id number]     Arrival date & time 09/24/13  1648 History   First MD Initiated Contact with Patient 09/24/13 1701     Chief Complaint  Patient presents with  . Otalgia     (Consider location/radiation/quality/duration/timing/severity/associated sxs/prior Treatment) HPI   Alexandria Webster is a 48 y.o. female presenting with a 3 day history of left ear pain and decreased hearing acuity.  Her son looked in her ear and described a "red ball" in the ear.  She denies fevers or chills, and there has been no drainage from the ear.  She denies facial pain, has had no nasal congestion or drainage.  She has taken no medicines for her symptoms.  Past medical history is significant for diabetes,  Her last cbg today was 159.      Past Medical History  Diagnosis Date  . Asthma   . Diabetes mellitus   . Hypertension   . PVD (peripheral vascular disease)   . DDD (degenerative disc disease), lumbar    Past Surgical History  Procedure Laterality Date  . Femoral artery stent  right leg  . Back surgery    . Coronary angioplasty with stent placement    . Coronary artery bypass graft     Family History  Problem Relation Age of Onset  . Stroke Mother   . Hypertension Mother   . Heart failure Father    History  Substance Use Topics  . Smoking status: Current Every Day Smoker -- 0.50 packs/day for 30 years    Types: Cigarettes  . Smokeless tobacco: Never Used  . Alcohol Use: No   OB History   Grav Para Term Preterm Abortions TAB SAB Ect Mult Living   2 1  1 1  1   1      Review of Systems  Constitutional: Negative for fever and chills.  HENT: Positive for ear pain and hearing loss. Negative for congestion, ear discharge, facial swelling, rhinorrhea, sinus pressure, sore throat, tinnitus, trouble swallowing and voice change.   Eyes: Negative for discharge.  Respiratory: Positive for cough. Negative for shortness of breath, wheezing and stridor.   Cardiovascular: Negative for chest  pain.  Gastrointestinal: Negative for abdominal pain.  Genitourinary: Negative.       Allergies  Review of patient's allergies indicates no known allergies.  Home Medications   Prior to Admission medications   Medication Sig Start Date End Date Taking? Authorizing Provider  amLODipine (NORVASC) 5 MG tablet Take 1 tablet (5 mg total) by mouth daily. 11/14/12  Yes Alexandria Luther Parody, MD  aspirin EC 81 MG tablet Take 81 mg by mouth daily.   Yes Historical Provider, MD  budesonide-formoterol (SYMBICORT) 160-4.5 MCG/ACT inhaler Inhale 2 puffs into the lungs 2 (two) times daily.   Yes Historical Provider, MD  Carboxymethylcellul-Glycerin (CLEAR EYES FOR DRY EYES OP) Place 2 drops into both eyes 2 (two) times daily.   Yes Historical Provider, MD  insulin detemir (LEVEMIR) 100 UNIT/ML injection Inject 15 Units into the skin at bedtime.   Yes Historical Provider, MD   BP 178/92  Pulse 95  Temp(Src) 97.9 F (36.6 C) (Oral)  Resp 18  Ht 5\' 4"  (1.626 m)  Wt 165 lb (74.844 kg)  BMI 28.31 kg/m2  SpO2 99% Physical Exam  Constitutional: She is oriented to person, place, and time. She appears well-developed and well-nourished.  HENT:  Head: Normocephalic and atraumatic.  Right Ear: Tympanic membrane and ear canal normal.  Left Ear: There is  swelling. No drainage. Decreased hearing is noted.  Nose: Mucosal edema and rhinorrhea present.  Mouth/Throat: Uvula is midline, oropharynx is clear and moist and mucous membranes are normal. No oropharyngeal exudate, posterior oropharyngeal edema, posterior oropharyngeal erythema or tonsillar abscesses.  Small fluctuant edematous papule distal let ear canal.  No drainage or pointing.  Unable to see the remaining ear canal and TM secondary to this swelling.   Eyes: Conjunctivae are normal.  Cardiovascular: Normal rate and normal heart sounds.   Pulmonary/Chest: Effort normal. No respiratory distress. She has no wheezes. She has no rales.  Abdominal: Soft.  There is no tenderness.  Musculoskeletal: Normal range of motion.  Neurological: She is alert and oriented to person, place, and time.  Skin: Skin is warm and dry. No rash noted.  Psychiatric: She has a normal mood and affect.    ED Course  Procedures (including critical care time)   Gentle pressure applied using cotton swab with no drainage from the site. Pt tolerated well.   Labs Review Labs Reviewed - No data to display  Imaging Review No results found.   EKG Interpretation None      MDM   Final diagnoses:  Lesion of external ear canal, left    Cortisporin gtts, auralgan gtts.  F/u with pcp in 1 week if not improved.    Alexandria Jefferson, PA-C 09/24/13 1722

## 2013-09-27 NOTE — ED Provider Notes (Signed)
Medical screening examination/treatment/procedure(s) were performed by non-physician practitioner and as supervising physician I was immediately available for consultation/collaboration.   EKG Interpretation None        Christopher J. Pollina, MD 09/27/13 1503 

## 2014-01-13 ENCOUNTER — Encounter (HOSPITAL_COMMUNITY): Payer: Self-pay | Admitting: Emergency Medicine

## 2014-08-05 ENCOUNTER — Emergency Department (HOSPITAL_COMMUNITY)
Admission: EM | Admit: 2014-08-05 | Discharge: 2014-08-05 | Disposition: A | Discharge status: Home / Self Care | Payer: BLUE CROSS/BLUE SHIELD | Attending: Emergency Medicine | Admitting: Emergency Medicine

## 2014-08-05 ENCOUNTER — Encounter (HOSPITAL_COMMUNITY): Payer: Self-pay | Admitting: Cardiology

## 2014-08-05 DIAGNOSIS — Z720 Tobacco use: Secondary | ICD-10-CM | POA: Insufficient documentation

## 2014-08-05 DIAGNOSIS — Z794 Long term (current) use of insulin: Secondary | ICD-10-CM | POA: Insufficient documentation

## 2014-08-05 DIAGNOSIS — Z79899 Other long term (current) drug therapy: Secondary | ICD-10-CM | POA: Insufficient documentation

## 2014-08-05 DIAGNOSIS — R110 Nausea: Secondary | ICD-10-CM | POA: Insufficient documentation

## 2014-08-05 DIAGNOSIS — Z7951 Long term (current) use of inhaled steroids: Secondary | ICD-10-CM | POA: Insufficient documentation

## 2014-08-05 DIAGNOSIS — E119 Type 2 diabetes mellitus without complications: Secondary | ICD-10-CM | POA: Diagnosis not present

## 2014-08-05 DIAGNOSIS — I10 Essential (primary) hypertension: Secondary | ICD-10-CM | POA: Diagnosis not present

## 2014-08-05 DIAGNOSIS — Z7982 Long term (current) use of aspirin: Secondary | ICD-10-CM | POA: Diagnosis not present

## 2014-08-05 DIAGNOSIS — Z8739 Personal history of other diseases of the musculoskeletal system and connective tissue: Secondary | ICD-10-CM | POA: Diagnosis not present

## 2014-08-05 DIAGNOSIS — L0201 Cutaneous abscess of face: Secondary | ICD-10-CM | POA: Insufficient documentation

## 2014-08-05 DIAGNOSIS — J45909 Unspecified asthma, uncomplicated: Secondary | ICD-10-CM | POA: Diagnosis not present

## 2014-08-05 MED ORDER — LIDOCAINE HCL (PF) 2 % IJ SOLN
INTRAMUSCULAR | Status: AC
  Administered 2014-08-05: 11:00:00
  Filled 2014-08-05: qty 10

## 2014-08-05 MED ORDER — CLINDAMYCIN HCL 150 MG PO CAPS: 150.0000 mg | ORAL_CAPSULE | Freq: Four times a day (QID) | ORAL | Status: DC

## 2014-08-05 MED ORDER — HYDROCODONE-ACETAMINOPHEN 5-325 MG PO TABS: 1.0000 | ORAL_TABLET | ORAL | Status: DC | PRN

## 2014-08-05 NOTE — ED Notes (Signed)
Red, raised, swollen area noted to left temporal area of forehead x3 days. PT denies any drainage from area or fever and reports hx of abscesses to face.

## 2014-08-05 NOTE — ED Provider Notes (Signed)
CSN: [id number]     Arrival date & time 08/05/14  0916 History  This chart was scribed for Alexandria Morrison, MD by Girtha Hake, ED Scribe. The patient was seen in [place]. The patient's care was started at 10:09 AM.     Chief Complaint  Patient presents with  . Abscess   Patient is a 49 y.o. female presenting with abscess. The history is provided by the patient. No language interpreter was used.  Abscess Associated symptoms: nausea   Associated symptoms: no fever    HPI Comments: Alexandria Webster is a 49 y.o. female with a history of controlled DM and HTNwho presents to the Emergency Department complaining of an abscess above her left eye beginning 4 days ago. She reports a mild loss of vision due to swelling. She complains of associated nausea.  Patient denies fever or chills.  PCP is Dr. Hilma Favors.  Past Medical History  Diagnosis Date  . Asthma   . Diabetes mellitus   . Hypertension   . PVD (peripheral vascular disease)   . DDD (degenerative disc disease), lumbar    Past Surgical History  Procedure Laterality Date  . Femoral artery stent  right leg  . Back surgery    . Coronary angioplasty with stent placement    . Coronary artery bypass graft     Family History  Problem Relation Age of Onset  . Stroke Mother   . Hypertension Mother   . Heart failure Father    History  Substance Use Topics  . Smoking status: Current Every Day Smoker -- 0.50 packs/day for 30 years    Types: Cigarettes  . Smokeless tobacco: Never Used  . Alcohol Use: No   OB History    Gravida Para Term Preterm AB TAB SAB Ectopic Multiple Living   2 1  1 1  1   1      Review of Systems  Constitutional: Negative for fever and chills.  Eyes: Positive for pain.  Gastrointestinal: Positive for nausea.  Skin: Positive for rash.      Allergies  Review of patient's allergies indicates no known allergies.  Home Medications   Prior to Admission medications   Medication Sig Start Date End  Date Taking? Authorizing Provider  amLODipine (NORVASC) 5 MG tablet Take 1 tablet (5 mg total) by mouth daily. 11/14/12  Yes Nimish Luther Parody, MD  aspirin EC 81 MG tablet Take 81 mg by mouth daily.   Yes Historical Provider, MD  budesonide-formoterol (SYMBICORT) 160-4.5 MCG/ACT inhaler Inhale 2 puffs into the lungs 2 (two) times daily.   Yes Historical Provider, MD  Carboxymethylcellul-Glycerin (CLEAR EYES FOR DRY EYES OP) Place 2 drops into both eyes 2 (two) times daily.   Yes Historical Provider, MD  insulin detemir (LEVEMIR) 100 UNIT/ML injection Inject 15 Units into the skin at bedtime.   Yes Historical Provider, MD  sulfamethoxazole-trimethoprim (BACTRIM DS,SEPTRA DS) 800-160 MG per tablet  08/02/14  Yes Historical Provider, MD  clindamycin (CLEOCIN) 150 MG capsule Take 1 capsule (150 mg total) by mouth every 6 (six) hours. 08/05/14   Alexandria Morrison, MD  HYDROcodone-acetaminophen (NORCO) 5-325 MG per tablet Take 1-2 tablets by mouth every 4 (four) hours as needed. 08/05/14   Alexandria Morrison, MD   Triage Vitals: BP 163/89 mmHg  Pulse 107  Temp(Src) 98.3 F (36.8 C) (Oral)  Resp 18  Ht 5\' 4"  (1.626 m)  Wt 165 lb (74.844 kg)  BMI 28.31 kg/m2  SpO2 99% Physical Exam  Constitutional:  She is oriented to person, place, and time. She appears well-developed and well-nourished. No distress.  HENT:  Head: Normocephalic and atraumatic.  Eyes: Conjunctivae and EOM are normal. Pupils are equal, round, and reactive to light.  Periorbital swelling. Worst on the left upper temple region and left lateral extraorbital. No pain with movement of the eye. No trismus.   Approximately 3 cm of swelling, induration, and erythema/warth to left temple region. Central pustule.  Neck: Neck supple. No tracheal deviation present.  Cardiovascular: Normal rate.   Pulmonary/Chest: Effort normal. No respiratory distress.  Musculoskeletal: Normal range of motion.  Neurological: She is alert and oriented to person, place, and  time.  Skin: Skin is warm and dry. Rash noted. There is erythema.  See HENT.  Psychiatric: She has a normal mood and affect. Her behavior is normal.  Nursing note and vitals reviewed.   ED Course  Procedures (including critical care time) EMERGENCY DEPARTMENT US SOFT TISSUE INTERPRETATION "Study: Limited Soft Tissue Ultrasound"  INDICATIONS: Pain and Soft tissue infection Multiple views of the body part were obtained in real-time with a multi-frequency linear probe PERFORMED BY:  Myself IMAGES ARCHIVED?: Yes SIDE:Left BODY PART:Other soft tisse (comment in note) FINDINGS: Abcess present INTERPRETATION:  Abcess present   CPT: Neck [id number]  Upper extremity 41962-22  Axilla 97989-21  Chest wall 19417-40  Beast 81448-18  Upper back 56314-97  Lower back 02637-85  Abdominal wall 88502-77  Pelvic wall 41287-86  Lower extremity 76720-94  Other soft tissue 70962-83  INCISION AND DRAINAGE Performed by: Mariea Clonts Consent: Verbal consent obtained. Risks and benefits: risks, benefits and alternatives were discussed Type: abscess  Body area: left face Anesthesia: local infiltration Incision was made with a scalpel. Local anesthetic: lidocaine Anesthetic total: 5 ml Complexity: complex Blunt dissection to break up loculations Drainage: 8  Patient tolerance: Patient tolerated the procedure well with no immediate complications.    DIAGNOSTIC STUDIES: Oxygen Saturation is 99% on room air, normal by my interpretation.    COORDINATION OF CARE:    Labs Review Labs Reviewed - No data to display  Imaging Review No results found.   EKG Interpretation None      MDM   Final diagnoses:  Facial abscess   Left facial abscess, no evidence of post septal/orbital involvement. Ultrasound showed significant fluid collection. Incision and drainage was significant pus obtained. Plan for pain meds and a biotics and recheck in 48 hours.  Results and  differential diagnosis were discussed with the patient/parent/guardian. Close follow up outpatient was discussed, comfortable with the plan.   Medications  lidocaine (XYLOCAINE) 2 % injection (  Given by Other 08/05/14 1113)    Filed Vitals:   08/05/14 0927  BP: 163/89  Pulse: 107  Temp: 98.3 F (36.8 C)  TempSrc: Oral  Resp: 18  Height: 5\' 4"  (1.626 m)  Weight: 165 lb (74.844 kg)  SpO2: 99%    Final diagnoses:  Facial abscess       Alexandria Morrison, MD 08/05/14 1133

## 2014-08-05 NOTE — Discharge Instructions (Signed)
Soak abscess twice daily smoking to allow it to drain. Return for severe headache, confusion, persistent fevers or worsening symptoms. For severe pain take norco or vicodin however realize they have the potential for addiction and it can make you sleepy and has tylenol in it.  No operating machinery while taking.  Take antibiotics.  If you were given medicines take as directed.  If you are on coumadin or contraceptives realize their levels and effectiveness is altered by many different medicines.  If you have any reaction (rash, tongues swelling, other) to the medicines stop taking and see a physician.    If your blood pressure was elevated in the ER make sure you follow up for management with a primary doctor or return for chest pain, shortness of breath or stroke symptoms.  Please follow up as directed and return to the ER or see a physician for new or worsening symptoms.  Thank you. Filed Vitals:   08/05/14 0927  BP: 163/89  Pulse: 107  Temp: 98.3 F (36.8 C)  TempSrc: Oral  Resp: 18  Height: 5\' 4"  (1.626 m)  Weight: 165 lb (74.844 kg)  SpO2: 99%    Abscess An abscess (boil or furuncle) is an infected area on or under the skin. This area is filled with yellowish-white fluid (pus) and other material (debris). HOME CARE   Only take medicines as told by your doctor.  If you were given antibiotic medicine, take it as directed. Finish the medicine even if you start to feel better.  If gauze is used, follow your doctor's directions for changing the gauze.  To avoid spreading the infection:  Keep your abscess covered with a bandage.  Wash your hands well.  Do not share personal care items, towels, or whirlpools with others.  Avoid skin contact with others.  Keep your skin and clothes clean around the abscess.  Keep all doctor visits as told. GET HELP RIGHT AWAY IF:   You have more pain, puffiness (swelling), or redness in the wound site.  You have more fluid or blood  coming from the wound site.  You have muscle aches, chills, or you feel sick.  You have a fever. MAKE SURE YOU:   Understand these instructions.  Will watch your condition.  Will get help right away if you are not doing well or get worse. Document Released: 08/17/2007 Document Revised: 08/30/2011 Document Reviewed: 05/13/2011 [facility] Patient Information 2015 [place], [place]. This information is not intended to replace advice given to you by your health care provider. Make sure you discuss any questions you have with your health care provider.

## 2014-08-05 NOTE — ED Notes (Signed)
Boil to right side of face times 4 days.

## 2014-10-13 ENCOUNTER — Encounter (HOSPITAL_COMMUNITY): Payer: Self-pay | Admitting: Emergency Medicine

## 2014-10-13 ENCOUNTER — Emergency Department (HOSPITAL_COMMUNITY)
Admission: EM | Admit: 2014-10-13 | Discharge: 2014-10-14 | Disposition: A | Discharge status: Home / Self Care | Payer: BLUE CROSS/BLUE SHIELD | Attending: Emergency Medicine | Admitting: Emergency Medicine

## 2014-10-13 DIAGNOSIS — E119 Type 2 diabetes mellitus without complications: Secondary | ICD-10-CM | POA: Diagnosis not present

## 2014-10-13 DIAGNOSIS — Z794 Long term (current) use of insulin: Secondary | ICD-10-CM | POA: Diagnosis not present

## 2014-10-13 DIAGNOSIS — L02811 Cutaneous abscess of head [any part, except face]: Secondary | ICD-10-CM | POA: Diagnosis not present

## 2014-10-13 DIAGNOSIS — Z7951 Long term (current) use of inhaled steroids: Secondary | ICD-10-CM | POA: Diagnosis not present

## 2014-10-13 DIAGNOSIS — Z79899 Other long term (current) drug therapy: Secondary | ICD-10-CM | POA: Diagnosis not present

## 2014-10-13 DIAGNOSIS — Z9861 Coronary angioplasty status: Secondary | ICD-10-CM | POA: Insufficient documentation

## 2014-10-13 DIAGNOSIS — Z951 Presence of aortocoronary bypass graft: Secondary | ICD-10-CM | POA: Diagnosis not present

## 2014-10-13 DIAGNOSIS — Z7982 Long term (current) use of aspirin: Secondary | ICD-10-CM | POA: Diagnosis not present

## 2014-10-13 DIAGNOSIS — J45909 Unspecified asthma, uncomplicated: Secondary | ICD-10-CM | POA: Insufficient documentation

## 2014-10-13 DIAGNOSIS — Z720 Tobacco use: Secondary | ICD-10-CM | POA: Insufficient documentation

## 2014-10-13 DIAGNOSIS — I10 Essential (primary) hypertension: Secondary | ICD-10-CM | POA: Insufficient documentation

## 2014-10-13 DIAGNOSIS — L0201 Cutaneous abscess of face: Secondary | ICD-10-CM | POA: Insufficient documentation

## 2014-10-13 DIAGNOSIS — Z8739 Personal history of other diseases of the musculoskeletal system and connective tissue: Secondary | ICD-10-CM | POA: Insufficient documentation

## 2014-10-13 LAB — BASIC METABOLIC PANEL
Anion gap: 14 (ref 5–15)
BUN: 10 mg/dL (ref 6–20)
CO2: 22 mmol/L (ref 22–32)
CREATININE: 0.64 mg/dL (ref 0.44–1.00)
Calcium: 9.3 mg/dL (ref 8.9–10.3)
Chloride: 100 mmol/L — ABNORMAL LOW (ref 101–111)
GFR calc non Af Amer: >60 mL/min (ref >60)
GLUCOSE: 373 mg/dL — AB (ref 65–99)
Potassium: 3.6 mmol/L (ref 3.5–5.1)
SODIUM: 136 mmol/L (ref 135–145)

## 2014-10-13 LAB — CBC WITH DIFFERENTIAL/PLATELET
BASOS ABS: 0 10*3/uL (ref 0.0–0.1)
Basophils Relative: 0 % (ref 0–1)
Eosinophils Absolute: 0.2 10*3/uL (ref 0.0–0.7)
Eosinophils Relative: 1 % (ref 0–5)
HCT: 42.7 % (ref 36.0–46.0)
Hemoglobin: 14.9 g/dL (ref 12.0–15.0)
Lymphocytes Relative: 19 % (ref 12–46)
Lymphs Abs: 2.7 10*3/uL (ref 0.7–4.0)
MCH: 32.8 pg (ref 26.0–34.0)
MCHC: 34.9 g/dL (ref 30.0–36.0)
MCV: 94.1 fL (ref 78.0–100.0)
MONO ABS: 0.8 10*3/uL (ref 0.1–1.0)
Monocytes Relative: 5 % (ref 3–12)
NEUTROS ABS: 10.7 10*3/uL — AB (ref 1.7–7.7)
Neutrophils Relative %: 75 % (ref 43–77)
PLATELETS: 257 10*3/uL (ref 150–400)
RBC: 4.54 MIL/uL (ref 3.87–5.11)
RDW: 12.8 % (ref 11.5–15.5)
WBC: 14.4 10*3/uL — ABNORMAL HIGH (ref 4.0–10.5)

## 2014-10-13 LAB — CBG MONITORING, ED: Glucose-Capillary: 312 mg/dL — ABNORMAL HIGH (ref 65–99)

## 2014-10-13 MED ORDER — ACETAMINOPHEN 325 MG PO TABS: 650.0000 mg | ORAL_TABLET | Freq: Once | ORAL | Status: DC

## 2014-10-13 MED ORDER — HYDROCODONE-ACETAMINOPHEN 5-325 MG PO TABS
1.0000 | ORAL_TABLET | Freq: Once | ORAL | Status: AC
  Administered 2014-10-13: 1 via ORAL
  Filled 2014-10-13: qty 1

## 2014-10-13 MED ORDER — SODIUM CHLORIDE 0.9 % IV SOLN
INTRAVENOUS | Status: AC
Start: 2014-10-13 — End: 2014-10-13
  Administered 2014-10-13: 21:00:00 via INTRAVENOUS

## 2014-10-13 MED ORDER — CLINDAMYCIN HCL 150 MG PO CAPS
300.0000 mg | ORAL_CAPSULE | Freq: Three times a day (TID) | ORAL | Status: DC
Start: 2014-10-13 — End: 2014-12-21

## 2014-10-13 MED ORDER — ONDANSETRON HCL 4 MG/2ML IJ SOLN
4.0000 mg | Freq: Once | INTRAMUSCULAR | Status: AC
  Administered 2014-10-13: 4 mg via INTRAVENOUS
  Filled 2014-10-13: qty 2

## 2014-10-13 MED ORDER — HYDROCODONE-ACETAMINOPHEN 5-325 MG PO TABS: 1.0000 | ORAL_TABLET | ORAL | Status: DC | PRN

## 2014-10-13 MED ORDER — INSULIN DETEMIR 100 UNIT/ML ~~LOC~~ SOLN
15.0000 [IU] | Freq: Once | SUBCUTANEOUS | Status: AC
  Administered 2014-10-13: 15 [IU] via SUBCUTANEOUS
  Filled 2014-10-13: qty 0.15

## 2014-10-13 MED ORDER — CLINDAMYCIN PHOSPHATE 600 MG/50ML IV SOLN
600.0000 mg | Freq: Once | INTRAVENOUS | Status: AC
Start: 2014-10-13 — End: 2014-10-13
  Administered 2014-10-13: 600 mg via INTRAVENOUS
  Filled 2014-10-13: qty 50

## 2014-10-13 NOTE — ED Provider Notes (Signed)
CSN: [id number]     Arrival date & time 10/13/14  2836 History  This chart was scribed for Debroah Baller, NP working with Daleen Bo, MD by Rayna Sexton, ED Scribe. This patient was seen in room APA07/APA07 and the patient's care was started at 7:24 PM.    Chief Complaint  Patient presents with  . Abscess   The history is provided by the patient. No language interpreter was used.    HPI Comments: Alexandria Webster is a 49 y.o. female, with a hx of IDDM and MRSA, who presents to the Emergency Department complaining of a point of swelling and tenderness to her lower lip and behind her left ear with onset 5 days ago. Pt notes that she felt the point of swelling behind her left ear with the one on her lower lip following shortly thereafter. Pt notes associated drainage behind her left ear which has subsided, intermittent low-grade fever, chills, nausea and vomiting.   Past Medical History  Diagnosis Date  . Asthma   . Diabetes mellitus   . Hypertension   . PVD (peripheral vascular disease)   . DDD (degenerative disc disease), lumbar    Past Surgical History  Procedure Laterality Date  . Femoral artery stent  right leg  . Back surgery    . Coronary angioplasty with stent placement    . Coronary artery bypass graft     Family History  Problem Relation Age of Onset  . Stroke Mother   . Hypertension Mother   . Heart failure Father    History  Substance Use Topics  . Smoking status: Current Every Day Smoker -- 0.50 packs/day for 30 years    Types: Cigarettes  . Smokeless tobacco: Never Used  . Alcohol Use: No   OB History    Gravida Para Term Preterm AB TAB SAB Ectopic Multiple Living   2 1  1 1  1   1      Review of Systems  Constitutional: Positive for fever and chills.  Gastrointestinal: Positive for nausea and vomiting.  Skin: Positive for color change.  All other systems reviewed and are negative.  Allergies  Review of patient's allergies indicates no known  allergies.  Home Medications   Prior to Admission medications   Medication Sig Start Date End Date Taking? Authorizing Provider  amLODipine (NORVASC) 5 MG tablet Take 1 tablet (5 mg total) by mouth daily. 11/14/12  Yes Nimish Luther Parody, MD  aspirin EC 81 MG tablet Take 81 mg by mouth daily.   Yes Historical Provider, MD  budesonide-formoterol (SYMBICORT) 160-4.5 MCG/ACT inhaler Inhale 2 puffs into the lungs 2 (two) times daily.   Yes Historical Provider, MD  Carboxymethylcellul-Glycerin (CLEAR EYES FOR DRY EYES OP) Place 2 drops into both eyes 2 (two) times daily.   Yes Historical Provider, MD  GARLIC PO Take 1 tablet by mouth daily.   Yes Historical Provider, MD  insulin detemir (LEVEMIR) 100 UNIT/ML injection Inject 15 Units into the skin at bedtime.   Yes Historical Provider, MD  Multiple Vitamin (MULTIVITAMIN WITH MINERALS) TABS tablet Take 1 tablet by mouth daily.   Yes Historical Provider, MD  clindamycin (CLEOCIN) 150 MG capsule Take 2 capsules (300 mg total) by mouth 3 (three) times daily. 10/13/14   Luciann Gossett Bunnie Pion, NP  HYDROcodone-acetaminophen (NORCO/VICODIN) 5-325 MG per tablet Take 1 tablet by mouth every 4 (four) hours as needed. 10/13/14   Zaelyn Barbary Bunnie Pion, NP   BP 156/86 mmHg  Pulse 90  Temp(Src) 98.5 F (36.9 C) (Oral)  Resp 16  Ht 5\' 4"  (1.626 m)  Wt 163 lb (73.936 kg)  BMI 27.97 kg/m2  SpO2 99% Physical Exam  Constitutional: She is oriented to person, place, and time. She appears well-developed and well-nourished. No distress.  HENT:  Head: Normocephalic and atraumatic.  Mouth/Throat: Oropharynx is clear and moist.  Eyes: EOM are normal.  Neck: Neck supple. No tracheal deviation present.  Cardiovascular: Normal rate.   Pulmonary/Chest: Effort normal.  Abdominal: There is no tenderness.  Musculoskeletal: Normal range of motion.  Lymphadenopathy:    She has cervical adenopathy.  Neurological: She is alert and oriented to person, place, and time.  Skin: Skin is warm and dry.   3 cm area to the right side of the face just below the lip that is tender and minimal fluctuant; 4 cm raised tender area to the scalp behind left ear with a scab near the center. The area is firm.  Both areas surrounded by erythema but no red streaking.   Psychiatric: She has a normal mood and affect. Her behavior is normal.  Nursing note and vitals reviewed.   ED Course  Procedures  Dr. Eulis Foster in to examine the patient. Will give 1,000 ccs NS IV and Clindamycin 600 mg IV. Will recheck CBG and if stable will d/c home with antibiotics and pain management.   DIAGNOSTIC STUDIES: Oxygen Saturation is 100% on RA, normal by my interpretation.    COORDINATION OF CARE: 7:29 PM Discussed treatment plan with pt at bedside and pt agreed to plan. Patient states that she does not want admission unless absolutely necessary.   Labs Review Results for orders placed or performed during the hospital encounter of 10/13/14 (from the past 24 hour(s))  CBC with Differential/Platelet     Status: Abnormal   Collection Time: 10/13/14  7:58 PM  Result Value Ref Range   WBC 14.4 (H) 4.0 - 10.5 K/uL   RBC 4.54 3.87 - 5.11 MIL/uL   Hemoglobin 14.9 12.0 - 15.0 g/dL   HCT 42.7 36.0 - 46.0 %   MCV 94.1 78.0 - 100.0 fL   MCH 32.8 26.0 - 34.0 pg   MCHC 34.9 30.0 - 36.0 g/dL   RDW 12.8 11.5 - 15.5 %   Platelets 257 150 - 400 K/uL   Neutrophils Relative % 75 43 - 77 %   Neutro Abs 10.7 (H) 1.7 - 7.7 K/uL   Lymphocytes Relative 19 12 - 46 %   Lymphs Abs 2.7 0.7 - 4.0 K/uL   Monocytes Relative 5 3 - 12 %   Monocytes Absolute 0.8 0.1 - 1.0 K/uL   Eosinophils Relative 1 0 - 5 %   Eosinophils Absolute 0.2 0.0 - 0.7 K/uL   Basophils Relative 0 0 - 1 %   Basophils Absolute 0.0 0.0 - 0.1 K/uL  Basic metabolic panel     Status: Abnormal   Collection Time: 10/13/14  7:58 PM  Result Value Ref Range   Sodium 136 135 - 145 mmol/L   Potassium 3.6 3.5 - 5.1 mmol/L   Chloride 100 (L) 101 - 111 mmol/L   CO2 22 22 - 32  mmol/L   Glucose, Bld 373 (H) 65 - 99 mg/dL   BUN 10 6 - 20 mg/dL   Creatinine, Ser 0.64 0.44 - 1.00 mg/dL   Calcium 9.3 8.9 - 10.3 mg/dL   GFR calc non Af Amer >60 >60 mL/min   GFR calc Af Amer >60 >60 mL/min  Anion gap 14 5 - 15  CBG monitoring, ED     Status: Abnormal   Collection Time: 10/13/14 11:57 PM  Result Value Ref Range   Glucose-Capillary 312 (H) 65 - 99 mg/dL  CBG monitoring, ED     Status: Abnormal   Collection Time: 10/14/14 12:34 AM  Result Value Ref Range   Glucose-Capillary 298 (H) 65 - 99 mg/dL      MDM  49 y.o. female with hx of MRSA and abscess to right side of face below lip and left scalp area. Stable for d/c after IV NS and Clindamycin 600 mg IV. discussed with the patient need for follow up with her PCP tomorrow or return here if symptoms are worse. Patient voices understanding and agrees with plan. Will continue Clindamycin PO.  Final diagnoses:  Abscess of face  Abscess, scalp    I personally performed the services described in this documentation, which was scribed in my presence. The recorded information has been reviewed and is accurate.    [address] [place], NP 10/14/14 0124  Daleen Bo, MD 10/14/14 [phone]

## 2014-10-13 NOTE — ED Notes (Signed)
Patient reports abscess to face under lip that she noticed Friday. Swelling and erythema noted below lip. Patient also has abscess behind left ear.

## 2014-10-13 NOTE — ED Provider Notes (Signed)
  Face-to-face evaluation   History: She presents for recurrent facial and scalp abscesses. She had drainage from the left scalp wound. She denies fever, chills, nausea, vomiting, weakness, dizziness. She states that she is taking her usual medications. She is an insulin dependent diabetic.  Physical exam: Alert female in mild distress. 3 x 3 cm mass at the right lower lip, with a central pointing area. This mass is minimally indurated, and is not fluctuant. Left postauricular region has a re-by 4 cm indurated area with a central scab, and no associated fluctuance.  Medical screening examination/treatment/procedure(s) were conducted as a shared visit with non-physician practitioner(s) and myself.  I personally evaluated the patient during the encounter  Daleen Bo, MD 10/14/14 [phone]

## 2014-10-13 NOTE — Discharge Instructions (Signed)
Call Dr. Hilma Favors tomorrow for a follow up appointment. Tell them you were seen in the ED and started on antibiotics but need to be rechecked. Return here as needed for worsening symptoms.   Abscess An abscess is an infected area that contains a collection of pus and debris.It can occur in almost any part of the body. An abscess is also known as a furuncle or boil. CAUSES  An abscess occurs when tissue gets infected. This can occur from blockage of oil or sweat glands, infection of hair follicles, or a minor injury to the skin. As the body tries to fight the infection, pus collects in the area and creates pressure under the skin. This pressure causes pain. People with weakened immune systems have difficulty fighting infections and get certain abscesses more often.  SYMPTOMS Usually an abscess develops on the skin and becomes a painful mass that is red, warm, and tender. If the abscess forms under the skin, you may feel a moveable soft area under the skin. Some abscesses break open (rupture) on their own, but most will continue to get worse without care. The infection can spread deeper into the body and eventually into the bloodstream, causing you to feel ill.  DIAGNOSIS  Your caregiver will take your medical history and perform a physical exam. A sample of fluid may also be taken from the abscess to determine what is causing your infection. TREATMENT  Your caregiver may prescribe antibiotic medicines to fight the infection. However, taking antibiotics alone usually does not cure an abscess. Your caregiver may need to make a small cut (incision) in the abscess to drain the pus. In some cases, gauze is packed into the abscess to reduce pain and to continue draining the area. HOME CARE INSTRUCTIONS   Only take over-the-counter or prescription medicines for pain, discomfort, or fever as directed by your caregiver.  If you were prescribed antibiotics, take them as directed. Finish them even if you start  to feel better.  If gauze is used, follow your caregiver's directions for changing the gauze.  To avoid spreading the infection:  Keep your draining abscess covered with a bandage.  Wash your hands well.  Do not share personal care items, towels, or whirlpools with others.  Avoid skin contact with others.  Keep your skin and clothes clean around the abscess.  Keep all follow-up appointments as directed by your caregiver. SEEK MEDICAL CARE IF:   You have increased pain, swelling, redness, fluid drainage, or bleeding.  You have muscle aches, chills, or a general ill feeling.  You have a fever. MAKE SURE YOU:   Understand these instructions.  Will watch your condition.  Will get help right away if you are not doing well or get worse. Document Released: 12/08/2004 Document Revised: 08/30/2011 Document Reviewed: 05/13/2011 [facility] Patient Information 2015 [place], [place]. This information is not intended to replace advice given to you by your health care provider. Make sure you discuss any questions you have with your health care provider.

## 2014-10-14 LAB — CBG MONITORING, ED: Glucose-Capillary: 298 mg/dL — ABNORMAL HIGH (ref 65–99)

## 2014-10-14 NOTE — ED Notes (Signed)
CBG 298 

## 2014-12-21 ENCOUNTER — Emergency Department (HOSPITAL_COMMUNITY): Payer: BLUE CROSS/BLUE SHIELD

## 2014-12-21 ENCOUNTER — Emergency Department (HOSPITAL_COMMUNITY)
Admission: EM | Admit: 2014-12-21 | Discharge: 2014-12-21 | Disposition: A | Discharge status: Home / Self Care | Payer: BLUE CROSS/BLUE SHIELD | Attending: Emergency Medicine | Admitting: Emergency Medicine

## 2014-12-21 ENCOUNTER — Encounter (HOSPITAL_COMMUNITY): Payer: Self-pay | Admitting: Emergency Medicine

## 2014-12-21 DIAGNOSIS — Z9861 Coronary angioplasty status: Secondary | ICD-10-CM | POA: Insufficient documentation

## 2014-12-21 DIAGNOSIS — R739 Hyperglycemia, unspecified: Secondary | ICD-10-CM

## 2014-12-21 DIAGNOSIS — Z8739 Personal history of other diseases of the musculoskeletal system and connective tissue: Secondary | ICD-10-CM | POA: Insufficient documentation

## 2014-12-21 DIAGNOSIS — Z7951 Long term (current) use of inhaled steroids: Secondary | ICD-10-CM | POA: Diagnosis not present

## 2014-12-21 DIAGNOSIS — Z79899 Other long term (current) drug therapy: Secondary | ICD-10-CM | POA: Diagnosis not present

## 2014-12-21 DIAGNOSIS — J45909 Unspecified asthma, uncomplicated: Secondary | ICD-10-CM | POA: Diagnosis not present

## 2014-12-21 DIAGNOSIS — I10 Essential (primary) hypertension: Secondary | ICD-10-CM | POA: Insufficient documentation

## 2014-12-21 DIAGNOSIS — E1165 Type 2 diabetes mellitus with hyperglycemia: Secondary | ICD-10-CM | POA: Diagnosis not present

## 2014-12-21 DIAGNOSIS — R21 Rash and other nonspecific skin eruption: Secondary | ICD-10-CM | POA: Diagnosis not present

## 2014-12-21 DIAGNOSIS — Z794 Long term (current) use of insulin: Secondary | ICD-10-CM | POA: Insufficient documentation

## 2014-12-21 DIAGNOSIS — R109 Unspecified abdominal pain: Secondary | ICD-10-CM | POA: Diagnosis present

## 2014-12-21 DIAGNOSIS — Z951 Presence of aortocoronary bypass graft: Secondary | ICD-10-CM | POA: Insufficient documentation

## 2014-12-21 DIAGNOSIS — Z7982 Long term (current) use of aspirin: Secondary | ICD-10-CM | POA: Insufficient documentation

## 2014-12-21 DIAGNOSIS — Z720 Tobacco use: Secondary | ICD-10-CM | POA: Diagnosis not present

## 2014-12-21 DIAGNOSIS — R300 Dysuria: Secondary | ICD-10-CM | POA: Diagnosis not present

## 2014-12-21 LAB — URINALYSIS, ROUTINE W REFLEX MICROSCOPIC
BILIRUBIN URINE: NEGATIVE
Glucose, UA: >1000 mg/dL — AB
Leukocytes, UA: NEGATIVE
Nitrite: NEGATIVE
PH: 5.5 (ref 5.0–8.0)
UROBILINOGEN UA: 0.2 mg/dL (ref 0.0–1.0)

## 2014-12-21 LAB — URINE MICROSCOPIC-ADD ON

## 2014-12-21 LAB — BASIC METABOLIC PANEL
Anion gap: 9 (ref 5–15)
BUN: 10 mg/dL (ref 6–20)
CHLORIDE: 102 mmol/L (ref 101–111)
CO2: 28 mmol/L (ref 22–32)
Calcium: 9.5 mg/dL (ref 8.9–10.3)
Creatinine, Ser: 0.6 mg/dL (ref 0.44–1.00)
GFR calc non Af Amer: >60 mL/min (ref >60)
Glucose, Bld: 400 mg/dL — ABNORMAL HIGH (ref 65–99)
Potassium: 4.7 mmol/L (ref 3.5–5.1)
SODIUM: 139 mmol/L (ref 135–145)

## 2014-12-21 MED ORDER — CEPHALEXIN 500 MG PO CAPS: 500.0000 mg | ORAL_CAPSULE | Freq: Four times a day (QID) | ORAL | Status: DC

## 2014-12-21 MED ORDER — HYDROCODONE-ACETAMINOPHEN 5-325 MG PO TABS: ORAL_TABLET | ORAL | Status: DC

## 2014-12-21 MED ORDER — OXYCODONE-ACETAMINOPHEN 5-325 MG PO TABS
1.0000 | ORAL_TABLET | Freq: Once | ORAL | Status: AC
  Administered 2014-12-21: 1 via ORAL
  Filled 2014-12-21: qty 1

## 2014-12-21 MED ORDER — ONDANSETRON 8 MG PO TBDP
8.0000 mg | ORAL_TABLET | Freq: Once | ORAL | Status: AC
  Administered 2014-12-21: 8 mg via ORAL
  Filled 2014-12-21: qty 1

## 2014-12-21 MED ORDER — SODIUM CHLORIDE 0.9 % IV BOLUS (SEPSIS)
1000.0000 mL | Freq: Once | INTRAVENOUS | Status: AC
  Administered 2014-12-21: 1000 mL via INTRAVENOUS

## 2014-12-21 NOTE — ED Notes (Signed)
Pt states that it has been burning when she urinates, dark urine, intermittent fever, and bilateral flank pain for over a week.

## 2014-12-21 NOTE — Discharge Instructions (Signed)
Hyperglycemia °High blood sugar (hyperglycemia) means that the level of sugar in your blood is higher than it should be. Signs of high blood sugar include: °· Feeling thirsty. °· Frequent peeing (urinating). °· Feeling tired or sleepy. °· Dry mouth. °· Vision changes. °· Feeling weak. °· Feeling hungry but losing weight. °· Numbness and tingling in your hands or feet. °· Headache. °When you ignore these signs, your blood sugar may keep going up. These problems may get worse, and other problems may begin. °HOME CARE °· Check your blood sugars as told by your doctor. Write down the numbers with the date and time. °· Take the right amount of insulin or diabetes pills at the right time. Write down the dose with date and time. °· Refill your insulin or diabetes pills before running out. °· Watch what you eat. Follow your meal plan. °· Drink liquids without sugar, such as water. Check with your doctor if you have kidney or heart disease. °· Follow your doctor's orders for exercise. Exercise at the same time of day. °· Keep your doctor's appointments. °GET HELP RIGHT AWAY IF:  °· You have trouble thinking or are confused. °· You have fast breathing with fruity smelling breath. °· You pass out (faint). °· You have 2 to 3 days of high blood sugars and you do not know why. °· You have chest pain. °· You are feeling sick to your stomach (nauseous) or throwing up (vomiting). °· You have sudden vision changes. °MAKE SURE YOU:  °· Understand these instructions. °· Will watch your condition. °· Will get help right away if you are not doing well or get worse. °  °This information is not intended to replace advice given to you by your health care provider. Make sure you discuss any questions you have with your health care provider. °  °Document Released: 12/26/2008 Document Revised: 03/21/2014 Document Reviewed: 11/04/2014 °Elsevier Interactive Patient Education ©2016 [employer] ° °Dysuria °Dysuria is pain or discomfort while  urinating. The pain or discomfort may be felt in the tube that carries urine out of the bladder (urethra) or in the surrounding tissue of the genitals. The pain may also be felt in the groin area, lower abdomen, and lower back. You may have to urinate frequently or have the sudden feeling that you have to urinate (urgency). Dysuria can affect both men and women, but is more common in women. °Dysuria can be caused by many different things, including: °· Urinary tract infection in women. °· Infection of the kidney or bladder. °· Kidney stones or bladder stones. °· Certain sexually transmitted infections (STIs), such as chlamydia. °· Dehydration. °· Inflammation of the vagina. °· Use of certain medicines. °· Use of certain soaps or scented products that cause irritation. °HOME CARE INSTRUCTIONS °Watch your dysuria for any changes. The following actions may help to reduce any discomfort you are feeling: °· Drink enough fluid to keep your urine clear or pale yellow. °· Empty your bladder often. Avoid holding urine for long periods of time. °· After a bowel movement or urination, women should cleanse from front to back, using each tissue only once. °· Empty your bladder after sexual intercourse. °· Take medicines only as directed by your health care provider. °· If you were prescribed an antibiotic medicine, finish it all even if you start to feel better. °· Avoid caffeine, tea, and alcohol. They can irritate the bladder and make dysuria worse. In men, alcohol may irritate the prostate. °· Keep all follow-up   visits as directed by your health care provider. This is important. °· If you had any tests done to find the cause of dysuria, it is your responsibility to obtain your test results. Ask the lab or department performing the test when and how you will get your results. Talk with your health care provider if you have any questions about your results. °SEEK MEDICAL CARE IF: °· You develop pain in your back or  sides. °· You have a fever. °· You have nausea or vomiting. °· You have blood in your urine. °· You are not urinating as often as you usually do. °SEEK IMMEDIATE MEDICAL CARE IF: °· You pain is severe and not relieved with medicines. °· You are unable to hold down any fluids. °· You or someone else notices a change in your mental function. °· You have a rapid heartbeat at rest. °· You have shaking or chills. °· You feel extremely weak. °  °This information is not intended to replace advice given to you by your health care provider. Make sure you discuss any questions you have with your health care provider. °  °Document Released: 11/27/2003 Document Revised: 03/21/2014 Document Reviewed: 10/24/2013 °Elsevier Interactive Patient Education ©2016 [employer] ° °

## 2014-12-23 LAB — URINE CULTURE

## 2014-12-24 NOTE — ED Provider Notes (Signed)
CSN: [id number]     Arrival date & time 12/21/14  0973 History   First MD Initiated Contact with Patient 12/21/14 [phone]     Chief Complaint  Patient presents with  . Flank Pain     (Consider location/radiation/quality/duration/timing/severity/associated sxs/prior Treatment) HPI   Alexandria Webster is a 49 y.o. female who presents to the Emergency Department complaining of bilateral flank pain and dysuria for one week or more.  She reports having intermittent fever, and dark colored urine.  She states the pain is worse with urination and she describes a burning sensation.  She reports a family hx of kidney stones.  She denies vomiting, abd pain, vaginal bleeding or pelvic pain.  She has taken OTC analgesics without relief.     Past Medical History  Diagnosis Date  . Asthma   . Diabetes mellitus   . Hypertension   . PVD (peripheral vascular disease) ([place])   . DDD (degenerative disc disease), lumbar    Past Surgical History  Procedure Laterality Date  . Femoral artery stent  right leg  . Back surgery    . Coronary angioplasty with stent placement    . Coronary artery bypass graft     Family History  Problem Relation Age of Onset  . Stroke Mother   . Hypertension Mother   . Heart failure Father    Social History  Substance Use Topics  . Smoking status: Current Every Day Smoker -- 0.50 packs/day for 30 years    Types: Cigarettes  . Smokeless tobacco: Never Used  . Alcohol Use: No   OB History    Gravida Para Term Preterm AB TAB SAB Ectopic Multiple Living   2 1  1 1  1   1      Review of Systems  Constitutional: Negative for fever, chills, activity change and appetite change.  HENT: Negative for facial swelling, sore throat and trouble swallowing.   Respiratory: Negative for chest tightness, shortness of breath and wheezing.   Gastrointestinal: Negative for nausea, vomiting and abdominal pain.  Genitourinary: Positive for dysuria and flank pain. Negative for urgency,  decreased urine volume, vaginal bleeding, vaginal discharge, difficulty urinating and pelvic pain.  Musculoskeletal: Negative for neck pain and neck stiffness.  Skin: Positive for rash. Negative for wound.  Neurological: Negative for dizziness, weakness, numbness and headaches.  All other systems reviewed and are negative.     Allergies  Review of patient's allergies indicates no known allergies.  Home Medications   Prior to Admission medications   Medication Sig Start Date End Date Taking? Authorizing Provider  acetaminophen (TYLENOL) 325 MG tablet Take 650 mg by mouth every 6 (six) hours as needed for fever.   Yes Historical Provider, MD  amLODipine (NORVASC) 5 MG tablet Take 1 tablet (5 mg total) by mouth daily. 11/14/12  Yes Nimish Luther Parody, MD  aspirin EC 81 MG tablet Take 81 mg by mouth daily.   Yes Historical Provider, MD  budesonide-formoterol (SYMBICORT) 160-4.5 MCG/ACT inhaler Inhale 2 puffs into the lungs 2 (two) times daily.   Yes Historical Provider, MD  Carboxymethylcellul-Glycerin (CLEAR EYES FOR DRY EYES OP) Place 2 drops into both eyes 2 (two) times daily.   Yes Historical Provider, MD  GARLIC PO Take 1 tablet by mouth daily.   Yes Historical Provider, MD  insulin detemir (LEVEMIR) 100 UNIT/ML injection Inject 15 Units into the skin at bedtime.   Yes Historical Provider, MD  Multiple Vitamin (MULTIVITAMIN WITH MINERALS) TABS tablet Take 1  tablet by mouth daily.   Yes Historical Provider, MD  cephALEXin (KEFLEX) 500 MG capsule Take 1 capsule (500 mg total) by mouth 4 (four) times daily. For 7 days 12/21/14   Kem Parkinson, PA-C  HYDROcodone-acetaminophen (NORCO/VICODIN) 5-325 MG tablet Take one-two tabs po q 4-6 hrs prn pain 12/21/14   Carletta Feasel, PA-C   BP 158/77 mmHg  Pulse 76  Temp(Src) 98.6 F (37 C) (Oral)  Resp 16  Ht 5\' 4"  (1.626 m)  Wt 163 lb (73.936 kg)  BMI 27.97 kg/m2  SpO2 98% Physical Exam  Constitutional: She is oriented to person, place, and time.  She appears well-developed and well-nourished. No distress.  HENT:  Head: Normocephalic and atraumatic.  Mouth/Throat: Oropharynx is clear and moist.  Neck: Normal range of motion. Neck supple.  Cardiovascular: Normal rate and regular rhythm.   No murmur heard. Pulmonary/Chest: Effort normal and breath sounds normal. No respiratory distress.  Abdominal: Soft. Normal appearance. She exhibits no distension. There is no hepatosplenomegaly. There is no tenderness. There is CVA tenderness. There is no rebound and no guarding.  Musculoskeletal: Normal range of motion.  Neurological: She is alert and oriented to person, place, and time. She exhibits normal muscle tone. Coordination normal.  Skin: Skin is warm. No rash noted.  Psychiatric: She has a normal mood and affect.  Nursing note and vitals reviewed.   ED Course  Procedures (including critical care time) Labs Review Labs Reviewed  URINALYSIS, ROUTINE W REFLEX MICROSCOPIC (NOT AT [facility]) - Abnormal; Notable for the following:    APPearance HAZY (*)    Specific Gravity, Urine >1.030 (*)    Glucose, UA >1000 (*)    Hgb urine dipstick TRACE (*)    Ketones, ur TRACE (*)    Protein, ur TRACE (*)    All other components within normal limits  URINE MICROSCOPIC-ADD ON - Abnormal; Notable for the following:    Squamous Epithelial / LPF MANY (*)    Bacteria, UA FEW (*)    All other components within normal limits  BASIC METABOLIC PANEL - Abnormal; Notable for the following:    Glucose, Bld 400 (*)    All other components within normal limits  URINE CULTURE    Imaging Review Ct Renal Stone Study  12/21/2014  CLINICAL DATA:  Bilateral flank pain and hematuria for 1 week. EXAM: CT ABDOMEN AND PELVIS WITHOUT CONTRAST TECHNIQUE: Multidetector CT imaging of the abdomen and pelvis was performed following the standard protocol without IV contrast. COMPARISON:  04/11/2013 FINDINGS: No evidence of renal calculi, ureteral calculi or hydronephrosis  bilaterally. The bladder is mildly distended and contains a small amount of air which may reflect recent catheterization. The liver, gallbladder, pancreas, spleen and adrenal glands appear unremarkable by unenhanced CT. Bowel shows no evidence of obstruction or inflammation. There is a moderate amount of fecal material throughout the colon without evidence of focal impaction. No free air, free fluid or abnormal fluid collection is identified. Stable appearance of aortobifemoral graft. Uterus and adnexal regions are unremarkable by CT. No hernias are identified. Bony structures show stable diffuse spondylosis of the lumbar spine and visualized lower thoracic spine with vacuum disc present at multiple levels. No fracture identified IMPRESSION: No evidence of renal or ureteral calculi. There is a small amount of air in the bladder lumen which may reflect recent catheterization. Electronically Signed   By: Aletta Edouard M.D.   On: 12/21/2014 10:39    I have personally reviewed and evaluated these images and  lab results as part of my medical decision-making.   EKG Interpretation None      MDM   Final diagnoses:  Hyperglycemia  Dysuria    Pt is well appearing.  Afebrile.  Non-toxic appearing.  Pain has improved, watching TV.  Vitals stable.  CT neg for stone.  Will culture urine and tx for possible UTI since pt is symptomatic.  She agrees to increase water intake, close PMD f/u.  Strict return precautions given.  Pt stable for d/c    Kem Parkinson, PA-C 12/24/14 2134  Milton Ferguson, MD 12/25/14 1536

## 2015-04-16 ENCOUNTER — Emergency Department (HOSPITAL_COMMUNITY)
Admission: EM | Admit: 2015-04-16 | Discharge: 2015-04-16 | Disposition: A | Discharge status: Home / Self Care | Payer: BLUE CROSS/BLUE SHIELD | Attending: Emergency Medicine | Admitting: Emergency Medicine

## 2015-04-16 ENCOUNTER — Encounter (HOSPITAL_COMMUNITY): Payer: Self-pay | Admitting: Emergency Medicine

## 2015-04-16 DIAGNOSIS — Z794 Long term (current) use of insulin: Secondary | ICD-10-CM | POA: Insufficient documentation

## 2015-04-16 DIAGNOSIS — Z7982 Long term (current) use of aspirin: Secondary | ICD-10-CM | POA: Diagnosis not present

## 2015-04-16 DIAGNOSIS — Z9861 Coronary angioplasty status: Secondary | ICD-10-CM | POA: Diagnosis not present

## 2015-04-16 DIAGNOSIS — H9202 Otalgia, left ear: Secondary | ICD-10-CM | POA: Diagnosis not present

## 2015-04-16 DIAGNOSIS — I10 Essential (primary) hypertension: Secondary | ICD-10-CM | POA: Insufficient documentation

## 2015-04-16 DIAGNOSIS — Z79899 Other long term (current) drug therapy: Secondary | ICD-10-CM | POA: Insufficient documentation

## 2015-04-16 DIAGNOSIS — Z7951 Long term (current) use of inhaled steroids: Secondary | ICD-10-CM | POA: Diagnosis not present

## 2015-04-16 DIAGNOSIS — R05 Cough: Secondary | ICD-10-CM | POA: Diagnosis present

## 2015-04-16 DIAGNOSIS — M549 Dorsalgia, unspecified: Secondary | ICD-10-CM | POA: Diagnosis not present

## 2015-04-16 DIAGNOSIS — F17210 Nicotine dependence, cigarettes, uncomplicated: Secondary | ICD-10-CM | POA: Diagnosis not present

## 2015-04-16 DIAGNOSIS — H61892 Other specified disorders of left external ear: Secondary | ICD-10-CM | POA: Insufficient documentation

## 2015-04-16 DIAGNOSIS — R350 Frequency of micturition: Secondary | ICD-10-CM | POA: Insufficient documentation

## 2015-04-16 DIAGNOSIS — J40 Bronchitis, not specified as acute or chronic: Secondary | ICD-10-CM

## 2015-04-16 DIAGNOSIS — E119 Type 2 diabetes mellitus without complications: Secondary | ICD-10-CM | POA: Diagnosis not present

## 2015-04-16 DIAGNOSIS — R109 Unspecified abdominal pain: Secondary | ICD-10-CM | POA: Insufficient documentation

## 2015-04-16 DIAGNOSIS — J45909 Unspecified asthma, uncomplicated: Secondary | ICD-10-CM | POA: Insufficient documentation

## 2015-04-16 LAB — URINALYSIS, ROUTINE W REFLEX MICROSCOPIC
Glucose, UA: >1000 mg/dL — AB
KETONES UR: 15 mg/dL — AB
Leukocytes, UA: NEGATIVE
NITRITE: NEGATIVE
Protein, ur: 30 mg/dL — AB
Specific Gravity, Urine: 1.025 (ref 1.005–1.030)
pH: 5 (ref 5.0–8.0)

## 2015-04-16 LAB — URINE MICROSCOPIC-ADD ON: WBC UA: NONE SEEN WBC/hpf (ref 0–5)

## 2015-04-16 MED ORDER — AMOXICILLIN 500 MG PO CAPS: 500.0000 mg | ORAL_CAPSULE | Freq: Three times a day (TID) | ORAL | Status: DC

## 2015-04-16 NOTE — ED Provider Notes (Signed)
CSN: [id number]     Arrival date & time 04/16/15  [id number] History  By signing my name below, I, Terressa Koyanagi, attest that this documentation has been prepared under the direction and in the presence of Milton Ferguson, MD. Electronically Signed: Terressa Koyanagi, ED Scribe. 04/16/2015. 8:35 AM.  Chief Complaint  Patient presents with  . Cough  . Sore Throat  . Back Pain   Patient is a 50 y.o. female presenting with cough and pharyngitis. The history is provided by the patient. No language interpreter was used.  Cough Cough characteristics:  Non-productive Severity:  Moderate Onset quality:  Sudden Duration:  3 days Timing:  Intermittent Progression:  Unchanged Chronicity:  New Smoker: yes   Associated symptoms: ear pain and sore throat   Associated symptoms: no eye discharge and no rash   Ear pain:    Location:  Left   Onset quality:  Sudden   Duration:  3 days   Timing:  Intermittent   Chronicity:  New Sore throat:    Severity:  Moderate   Onset quality:  Sudden   Duration:  3 days   Timing:  Intermittent Sore Throat This is a new problem.   PCP: Purvis Kilts, MD HPI Comments: Alexandria Webster is a 50 y.o. female, with PMHx noted below including daily tobacco use and asthma, who presents to the Emergency Department complaining of intermittent cough with associated mild left ear pain, sore throat, left flank pain and urinary frequency onset three days ago. Pt confirms sick contacts noting her grandson has nearly identical Sx.   Past Medical History  Diagnosis Date  . Asthma   . Diabetes mellitus   . Hypertension   . PVD (peripheral vascular disease) ([place])   . DDD (degenerative disc disease), lumbar    Past Surgical History  Procedure Laterality Date  . Femoral artery stent  right leg  . Back surgery    . Coronary angioplasty with stent placement    . Coronary artery bypass graft     Family History  Problem Relation Age of Onset  . Stroke Mother   . Hypertension  Mother   . Heart failure Father    Social History  Substance Use Topics  . Smoking status: Current Every Day Smoker -- 0.50 packs/day for 30 years    Types: Cigarettes  . Smokeless tobacco: Never Used  . Alcohol Use: No   OB History    Gravida Para Term Preterm AB TAB SAB Ectopic Multiple Living   2 1  1 1  1   1      Review of Systems  Constitutional: Negative for appetite change and fatigue.  HENT: Positive for ear pain and sore throat. Negative for congestion, ear discharge and sinus pressure.   Eyes: Negative for discharge.  Respiratory: Positive for cough.   Gastrointestinal: Negative for diarrhea.  Genitourinary: Positive for frequency and flank pain. Negative for hematuria.  Skin: Negative for rash.  Neurological: Negative for seizures.  Psychiatric/Behavioral: Negative for hallucinations.   Allergies  Review of patient's allergies indicates no known allergies.  Home Medications   Prior to Admission medications   Medication Sig Start Date End Date Taking? Authorizing Provider  acetaminophen (TYLENOL) 325 MG tablet Take 650 mg by mouth every 6 (six) hours as needed for fever.    Historical Provider, MD  amLODipine (NORVASC) 5 MG tablet Take 1 tablet (5 mg total) by mouth daily. 11/14/12   Nimish Luther Parody, MD  aspirin EC 81 MG  tablet Take 81 mg by mouth daily.    Historical Provider, MD  budesonide-formoterol (SYMBICORT) 160-4.5 MCG/ACT inhaler Inhale 2 puffs into the lungs 2 (two) times daily.    Historical Provider, MD  Carboxymethylcellul-Glycerin (CLEAR EYES FOR DRY EYES OP) Place 2 drops into both eyes 2 (two) times daily.    Historical Provider, MD  cephALEXin (KEFLEX) 500 MG capsule Take 1 capsule (500 mg total) by mouth 4 (four) times daily. For 7 days 12/21/14   Tammy Triplett, PA-C  GARLIC PO Take 1 tablet by mouth daily.    Historical Provider, MD  HYDROcodone-acetaminophen (NORCO/VICODIN) 5-325 MG tablet Take one-two tabs po q 4-6 hrs prn pain 12/21/14   Tammy  Triplett, PA-C  insulin detemir (LEVEMIR) 100 UNIT/ML injection Inject 15 Units into the skin at bedtime.    Historical Provider, MD  Multiple Vitamin (MULTIVITAMIN WITH MINERALS) TABS tablet Take 1 tablet by mouth daily.    Historical Provider, MD   Triage Vitals: BP 160/91 mmHg  Pulse 108  Temp(Src) 97.6 F (36.4 C) (Axillary)  Resp 18  Ht 5\' 4"  (1.626 m)  Wt 163 lb (73.936 kg)  BMI 27.97 kg/m2  SpO2 98% Physical Exam  Constitutional: She is oriented to person, place, and time. She appears well-developed.  HENT:  Head: Normocephalic.  Right Ear: Tympanic membrane normal.  Left Ear: There is swelling.     Eyes: Conjunctivae and EOM are normal. No scleral icterus.  Neck: Neck supple. No thyromegaly present.  Cardiovascular: Normal rate and regular rhythm.  Exam reveals no gallop and no friction rub.   No murmur heard. Pulmonary/Chest: No stridor. She has no wheezes. She has no rales. She exhibits no tenderness.  Abdominal: She exhibits no distension. There is no tenderness. There is no rebound.  Musculoskeletal: Normal range of motion. She exhibits no edema.  Mild left flank tenderness.   Lymphadenopathy:    She has no cervical adenopathy.  Neurological: She is oriented to person, place, and time. She exhibits normal muscle tone. Coordination normal.  Skin: No rash noted. No erythema.  Psychiatric: She has a normal mood and affect. Her behavior is normal.    ED Course  Procedures (including critical care time) DIAGNOSTIC STUDIES: Oxygen Saturation is 98% on ra, nl by my interpretation.    COORDINATION OF CARE: 8:22 AM: Discussed treatment plan which includes UA and meds with pt at bedside; patient verbalizes understanding and agrees with treatment plan. 9:51 AM: Recheck. Pt resting comfortably, ready for discharge.  Labs Review Labs Reviewed  URINALYSIS, ROUTINE W REFLEX MICROSCOPIC (NOT AT [facility]) - Abnormal; Notable for the following:    Glucose, UA >1000 (*)    Hgb  urine dipstick TRACE (*)    Bilirubin Urine SMALL (*)    Ketones, ur 15 (*)    Protein, ur 30 (*)    All other components within normal limits  URINE MICROSCOPIC-ADD ON - Abnormal; Notable for the following:    Squamous Epithelial / LPF TOO NUMEROUS TO COUNT (*)    Bacteria, UA MANY (*)    All other components within normal limits  I have personally reviewed and evaluated these lab results as part of my medical decision-making.   MDM   Final diagnoses:  None   Patient with bronchitis and urinary tract symptoms. She'll have a urine culture none and put on amoxicillin she will follow-up as needed    Milton Ferguson, MD 04/16/15 [id number]

## 2015-04-16 NOTE — ED Notes (Signed)
Pt states that she has had urinary frequency, back pain, sore throat, left ear pain, and cough x 3 days.

## 2015-04-16 NOTE — Discharge Instructions (Signed)
Tylenol or motrin for pain.  Follow up as needed

## 2015-04-16 NOTE — ED Notes (Signed)
Pt made aware to return if symptoms worsen or if any life threatening symptoms occur.   

## 2015-04-17 LAB — URINE CULTURE

## 2015-05-19 ENCOUNTER — Encounter (HOSPITAL_COMMUNITY): Payer: Self-pay | Admitting: Emergency Medicine

## 2015-05-19 ENCOUNTER — Emergency Department (HOSPITAL_COMMUNITY): Payer: BLUE CROSS/BLUE SHIELD

## 2015-05-19 ENCOUNTER — Emergency Department (HOSPITAL_COMMUNITY)
Admission: EM | Admit: 2015-05-19 | Discharge: 2015-05-19 | Disposition: A | Discharge status: Home / Self Care | Payer: BLUE CROSS/BLUE SHIELD | Attending: Emergency Medicine | Admitting: Emergency Medicine

## 2015-05-19 DIAGNOSIS — J45909 Unspecified asthma, uncomplicated: Secondary | ICD-10-CM | POA: Diagnosis not present

## 2015-05-19 DIAGNOSIS — E1151 Type 2 diabetes mellitus with diabetic peripheral angiopathy without gangrene: Secondary | ICD-10-CM | POA: Diagnosis not present

## 2015-05-19 DIAGNOSIS — Z79891 Long term (current) use of opiate analgesic: Secondary | ICD-10-CM | POA: Insufficient documentation

## 2015-05-19 DIAGNOSIS — F17210 Nicotine dependence, cigarettes, uncomplicated: Secondary | ICD-10-CM | POA: Diagnosis not present

## 2015-05-19 DIAGNOSIS — I10 Essential (primary) hypertension: Secondary | ICD-10-CM | POA: Diagnosis not present

## 2015-05-19 DIAGNOSIS — R739 Hyperglycemia, unspecified: Secondary | ICD-10-CM

## 2015-05-19 DIAGNOSIS — Z7982 Long term (current) use of aspirin: Secondary | ICD-10-CM | POA: Insufficient documentation

## 2015-05-19 DIAGNOSIS — J209 Acute bronchitis, unspecified: Secondary | ICD-10-CM | POA: Diagnosis not present

## 2015-05-19 DIAGNOSIS — R05 Cough: Secondary | ICD-10-CM | POA: Diagnosis present

## 2015-05-19 DIAGNOSIS — Z79899 Other long term (current) drug therapy: Secondary | ICD-10-CM | POA: Insufficient documentation

## 2015-05-19 DIAGNOSIS — E1165 Type 2 diabetes mellitus with hyperglycemia: Secondary | ICD-10-CM | POA: Diagnosis not present

## 2015-05-19 LAB — BASIC METABOLIC PANEL
Anion gap: 10 (ref 5–15)
BUN: 16 mg/dL (ref 6–20)
CO2: 25 mmol/L (ref 22–32)
CREATININE: 0.56 mg/dL (ref 0.44–1.00)
Calcium: 9.5 mg/dL (ref 8.9–10.3)
Chloride: 102 mmol/L (ref 101–111)
GFR calc Af Amer: >60 mL/min (ref >60)
Glucose, Bld: 309 mg/dL — ABNORMAL HIGH (ref 65–99)
Potassium: 3.9 mmol/L (ref 3.5–5.1)
Sodium: 137 mmol/L (ref 135–145)

## 2015-05-19 LAB — CBG MONITORING, ED: GLUCOSE-CAPILLARY: 287 mg/dL — AB (ref 65–99)

## 2015-05-19 MED ORDER — BENZONATATE 100 MG PO CAPS
200.0000 mg | ORAL_CAPSULE | Freq: Once | ORAL | Status: AC
  Administered 2015-05-19: 200 mg via ORAL
  Filled 2015-05-19: qty 2

## 2015-05-19 MED ORDER — AZITHROMYCIN 250 MG PO TABS
500.0000 mg | ORAL_TABLET | Freq: Once | ORAL | Status: AC
  Administered 2015-05-19: 500 mg via ORAL
  Filled 2015-05-19: qty 2

## 2015-05-19 MED ORDER — ALBUTEROL SULFATE HFA 108 (90 BASE) MCG/ACT IN AERS: 1.0000 | INHALATION_SPRAY | Freq: Four times a day (QID) | RESPIRATORY_TRACT | Status: DC | PRN

## 2015-05-19 MED ORDER — AZITHROMYCIN 250 MG PO TABS: ORAL_TABLET | ORAL | Status: DC

## 2015-05-19 MED ORDER — BENZONATATE 100 MG PO CAPS: 200.0000 mg | ORAL_CAPSULE | Freq: Three times a day (TID) | ORAL | Status: DC | PRN

## 2015-05-19 NOTE — Discharge Instructions (Signed)
Acute Bronchitis Bronchitis is inflammation of the airways that extend from the windpipe into the lungs (bronchi). The inflammation often causes mucus to develop. This leads to a cough, which is the most common symptom of bronchitis.  In acute bronchitis, the condition usually develops suddenly and goes away over time, usually in a couple weeks. Smoking, allergies, and asthma can make bronchitis worse. Repeated episodes of bronchitis may cause further lung problems.  CAUSES Acute bronchitis is most often caused by the same virus that causes a cold. The virus can spread from person to person (contagious) through coughing, sneezing, and touching contaminated objects. SIGNS AND SYMPTOMS   Cough.   Fever.   Coughing up mucus.   Body aches.   Chest congestion.   Chills.   Shortness of breath.   Sore throat.  DIAGNOSIS  Acute bronchitis is usually diagnosed through a physical exam. Your health care provider will also ask you questions about your medical history. Tests, such as chest X-rays, are sometimes done to rule out other conditions.  TREATMENT  Acute bronchitis usually goes away in a couple weeks. Oftentimes, no medical treatment is necessary. Medicines are sometimes given for relief of fever or cough. Antibiotic medicines are usually not needed but may be prescribed in certain situations. In some cases, an inhaler may be recommended to help reduce shortness of breath and control the cough. A cool mist vaporizer may also be used to help thin bronchial secretions and make it easier to clear the chest.  HOME CARE INSTRUCTIONS  Get plenty of rest.   Drink enough fluids to keep your urine clear or pale yellow (unless you have a medical condition that requires fluid restriction). Increasing fluids may help thin your respiratory secretions (sputum) and reduce chest congestion, and it will prevent dehydration.   Take medicines only as directed by your health care provider.  If  you were prescribed an antibiotic medicine, finish it all even if you start to feel better.  Avoid smoking and secondhand smoke. Exposure to cigarette smoke or irritating chemicals will make bronchitis worse. If you are a smoker, consider using nicotine gum or skin patches to help control withdrawal symptoms. Quitting smoking will help your lungs heal faster.   Reduce the chances of another bout of acute bronchitis by washing your hands frequently, avoiding people with cold symptoms, and trying not to touch your hands to your mouth, nose, or eyes.   Keep all follow-up visits as directed by your health care provider.  SEEK MEDICAL CARE IF: Your symptoms do not improve after 1 week of treatment.  SEEK IMMEDIATE MEDICAL CARE IF:  You develop an increased fever or chills.   You have chest pain.   You have severe shortness of breath.  You have bloody sputum.   You develop dehydration.  You faint or repeatedly feel like you are going to pass out.  You develop repeated vomiting.  You develop a severe headache. MAKE SURE YOU:   Understand these instructions.  Will watch your condition.  Will get help right away if you are not doing well or get worse.   This information is not intended to replace advice given to you by your health care provider. Make sure you discuss any questions you have with your health care provider.   Document Released: 04/07/2004 Document Revised: 03/21/2014 Document Reviewed: 08/21/2012 Elsevier Interactive Patient Education 2016 [employer]  Hyperglycemia High blood sugar (hyperglycemia) means that the level of sugar in your blood is higher than it  should be. Signs of high blood sugar include:  Feeling thirsty.  Frequent peeing (urinating).  Feeling tired or sleepy.  Dry mouth.  Vision changes.  Feeling weak.  Feeling hungry but losing weight.  Numbness and tingling in your hands or feet.  Headache. When you ignore these signs, your  blood sugar may keep going up. These problems may get worse, and other problems may begin. HOME CARE  Check your blood sugars as told by your doctor. Write down the numbers with the date and time.  Take the right amount of insulin or diabetes pills at the right time. Write down the dose with date and time.  Refill your insulin or diabetes pills before running out.  Watch what you eat. Follow your meal plan.  Drink liquids without sugar, such as water. Check with your doctor if you have kidney or heart disease.  Follow your doctor's orders for exercise. Exercise at the same time of day.  Keep your doctor's appointments. GET HELP RIGHT AWAY IF:   You have trouble thinking or are confused.  You have fast breathing with fruity smelling breath.  You pass out (faint).  You have 2 to 3 days of high blood sugars and you do not know why.  You have chest pain.  You are feeling sick to your stomach (nauseous) or throwing up (vomiting).  You have sudden vision changes. MAKE SURE YOU:   Understand these instructions.  Will watch your condition.  Will get help right away if you are not doing well or get worse.   This information is not intended to replace advice given to you by your health care provider. Make sure you discuss any questions you have with your health care provider.   Document Released: 12/26/2008 Document Revised: 03/21/2014 Document Reviewed: 11/04/2014 Elsevier Interactive Patient Education [employer].

## 2015-05-19 NOTE — ED Notes (Signed)
Pt states she has been coughing for the past few days.  Non-productive in nature.  States she has felt like she had a fever.

## 2015-05-19 NOTE — ED Provider Notes (Signed)
CSN: [id number]     Arrival date & time 05/19/15  0815 History   First MD Initiated Contact with Patient 05/19/15 [phone]     Chief Complaint  Patient presents with  . Cough     (Consider location/radiation/quality/duration/timing/severity/associated sxs/prior Treatment) The history is provided by the patient.   Alexandria Webster is a 50 y.o. female with a history significant for asthma and diabetes presenting with a several day history of nonproductive cough along with intermittent wheezing and shortness of breath.  She endorses burning pain in her lower sternum which is triggered by coughing only.  Her cough has been nonproductive.  She also endorses nasal congestion which has been treated with saline nasal spray.  She has had no other medications for her symptoms.  She uses Symbicort  twice daily for her asthma, does not have a rescue inhaler.  She has had subjective fevers.  Denies nausea, vomiting, abdominal pain.  She is tolerating by mouth intake well.  Her blood glucose levels have been in the mid 200 range which she states is average for her.  She has found no alleviators for her symptoms.  Past Medical History  Diagnosis Date  . Asthma   . Diabetes mellitus   . Hypertension   . PVD (peripheral vascular disease) ([place])   . DDD (degenerative disc disease), lumbar    Past Surgical History  Procedure Laterality Date  . Femoral artery stent  right leg  . Back surgery    . Coronary angioplasty with stent placement    . Coronary artery bypass graft     Family History  Problem Relation Age of Onset  . Stroke Mother   . Hypertension Mother   . Heart failure Father    Social History  Substance Use Topics  . Smoking status: Current Every Day Smoker -- 0.50 packs/day for 30 years    Types: Cigarettes  . Smokeless tobacco: Never Used  . Alcohol Use: No   OB History    Gravida Para Term Preterm AB TAB SAB Ectopic Multiple Living   2 1  1 1  1   1      Review of Systems    Constitutional: Positive for fever. Negative for chills.  HENT: Positive for congestion. Negative for ear pain, rhinorrhea, sinus pressure, sore throat, trouble swallowing and voice change.   Eyes: Negative for discharge.  Respiratory: Positive for cough and wheezing. Negative for shortness of breath and stridor.   Cardiovascular: Negative for chest pain.  Gastrointestinal: Negative for abdominal pain.  Genitourinary: Negative.       Allergies  Review of patient's allergies indicates no known allergies.  Home Medications   Prior to Admission medications   Medication Sig Start Date End Date Taking? Authorizing Provider  albuterol (PROVENTIL HFA;VENTOLIN HFA) 108 (90 Base) MCG/ACT inhaler Inhale 1-2 puffs into the lungs every 6 (six) hours as needed for wheezing or shortness of breath. 05/19/15   Evalee Jefferson, PA-C  amLODipine (NORVASC) 5 MG tablet Take 1 tablet (5 mg total) by mouth daily. 11/14/12   Nimish Luther Parody, MD  amoxicillin (AMOXIL) 500 MG capsule Take 1 capsule (500 mg total) by mouth 3 (three) times daily. 04/16/15   Milton Ferguson, MD  aspirin EC 81 MG tablet Take 81 mg by mouth daily.    Historical Provider, MD  azithromycin (ZITHROMAX) 250 MG tablet Take one tablet daily for 4 days 05/20/15   Evalee Jefferson, PA-C  benzonatate (TESSALON) 100 MG capsule Take 2 capsules (200  mg total) by mouth 3 (three) times daily as needed for cough. 05/19/15   Evalee Jefferson, PA-C  budesonide-formoterol (SYMBICORT) 160-4.5 MCG/ACT inhaler Inhale 2 puffs into the lungs 2 (two) times daily.    Historical Provider, MD  Carboxymethylcellul-Glycerin (CLEAR EYES FOR DRY EYES OP) Place 2 drops into both eyes 2 (two) times daily.    Historical Provider, MD  cephALEXin (KEFLEX) 500 MG capsule Take 1 capsule (500 mg total) by mouth 4 (four) times daily. For 7 days Patient not taking: Reported on 04/16/2015 12/21/14   Tammy Triplett, PA-C  GARLIC PO Take 1 tablet by mouth daily.    Historical Provider, MD   HYDROcodone-acetaminophen (NORCO/VICODIN) 5-325 MG tablet Take one-two tabs po q 4-6 hrs prn pain Patient not taking: Reported on 04/16/2015 12/21/14   Tammy Triplett, PA-C  insulin detemir (LEVEMIR) 100 UNIT/ML injection Inject 15 Units into the skin at bedtime.    Historical Provider, MD  Multiple Vitamin (MULTIVITAMIN WITH MINERALS) TABS tablet Take 1 tablet by mouth daily.    Historical Provider, MD   BP 121/78 mmHg  Pulse 92  Temp(Src) 98.2 F (36.8 C) (Oral)  Resp 18  Ht 5\' 4"  (1.626 m)  Wt 73.936 kg  BMI 27.97 kg/m2  SpO2 100% Physical Exam  Constitutional: She is oriented to person, place, and time. She appears well-developed and well-nourished.  HENT:  Head: Normocephalic and atraumatic.  Right Ear: Tympanic membrane and ear canal normal.  Left Ear: Tympanic membrane and ear canal normal.  Nose: Mucosal edema present. No rhinorrhea.  Mouth/Throat: Uvula is midline, oropharynx is clear and moist and mucous membranes are normal. No oropharyngeal exudate, posterior oropharyngeal edema, posterior oropharyngeal erythema or tonsillar abscesses.  Eyes: Conjunctivae are normal.  Cardiovascular: Normal rate and normal heart sounds.   Pulmonary/Chest: Effort normal. No respiratory distress. She has no decreased breath sounds. She has no wheezes. She has no rhonchi. She has no rales. She exhibits tenderness.    Reproducible lower sternal chest pain.  No crepitus, no deformity.  Abdominal: Soft. There is no tenderness.  Musculoskeletal: Normal range of motion. She exhibits no edema.  Neurological: She is alert and oriented to person, place, and time.  Skin: Skin is warm and dry. No rash noted.  Psychiatric: She has a normal mood and affect.    ED Course  Procedures (including critical care time) Labs Review Labs Reviewed  CBG MONITORING, ED - Abnormal; Notable for the following:    Glucose-Capillary 287 (*)    All other components within normal limits  BASIC METABOLIC PANEL     Imaging Review Dg Chest 2 View  05/19/2015  CLINICAL DATA:  Cough for 3 days EXAM: CHEST  2 VIEW COMPARISON:  02/11/2011 FINDINGS: Normal heart size. Lungs clear. No pneumothorax. No pleural effusion. IMPRESSION: No active cardiopulmonary disease. Electronically Signed   By: Marybelle Killings M.D.   On: 05/19/2015 08:57   I have personally reviewed and evaluated these images and lab results as part of my medical decision-making.   EKG Interpretation None      MDM   Final diagnoses:  Acute bronchitis, unspecified organism  Hyperglycemia     patient with bronchitis symptoms with a clear chest x-ray.  She has no rales on exam, no peripheral edema.  She was prescribed albuterol MDI for her wheezing episodes.  She is also prescribed Tessalon for the cough.  She was started on a Z-Pak given her risk factors for bacterial bronchitis.  Advised.  Follow-up with her  PCP or return here if symptoms persist or worsen.    Evalee Jefferson, PA-C 05/19/15 [place], MD 05/19/15 1332

## 2015-06-13 ENCOUNTER — Encounter (HOSPITAL_COMMUNITY): Payer: Self-pay | Admitting: *Deleted

## 2015-06-13 ENCOUNTER — Emergency Department (HOSPITAL_COMMUNITY)
Admission: EM | Admit: 2015-06-13 | Discharge: 2015-06-13 | Disposition: A | Discharge status: Home / Self Care | Payer: BLUE CROSS/BLUE SHIELD | Attending: Dermatology | Admitting: Dermatology

## 2015-06-13 DIAGNOSIS — R1013 Epigastric pain: Secondary | ICD-10-CM | POA: Diagnosis not present

## 2015-06-13 DIAGNOSIS — F17210 Nicotine dependence, cigarettes, uncomplicated: Secondary | ICD-10-CM | POA: Diagnosis not present

## 2015-06-13 DIAGNOSIS — Z5321 Procedure and treatment not carried out due to patient leaving prior to being seen by health care provider: Secondary | ICD-10-CM | POA: Insufficient documentation

## 2015-06-13 DIAGNOSIS — E1165 Type 2 diabetes mellitus with hyperglycemia: Secondary | ICD-10-CM | POA: Insufficient documentation

## 2015-06-13 DIAGNOSIS — I10 Essential (primary) hypertension: Secondary | ICD-10-CM | POA: Insufficient documentation

## 2015-06-13 DIAGNOSIS — J45909 Unspecified asthma, uncomplicated: Secondary | ICD-10-CM | POA: Diagnosis not present

## 2015-06-13 DIAGNOSIS — R1110 Vomiting, unspecified: Secondary | ICD-10-CM | POA: Diagnosis not present

## 2015-06-13 DIAGNOSIS — Z79899 Other long term (current) drug therapy: Secondary | ICD-10-CM | POA: Insufficient documentation

## 2015-06-13 DIAGNOSIS — Z794 Long term (current) use of insulin: Secondary | ICD-10-CM | POA: Insufficient documentation

## 2015-06-13 DIAGNOSIS — E1151 Type 2 diabetes mellitus with diabetic peripheral angiopathy without gangrene: Secondary | ICD-10-CM | POA: Insufficient documentation

## 2015-06-13 LAB — COMPREHENSIVE METABOLIC PANEL
ALK PHOS: 76 U/L (ref 38–126)
ALT: 20 U/L (ref 14–54)
AST: 18 U/L (ref 15–41)
Albumin: 4.5 g/dL (ref 3.5–5.0)
Anion gap: 14 (ref 5–15)
BUN: 20 mg/dL (ref 6–20)
CALCIUM: 9.6 mg/dL (ref 8.9–10.3)
CHLORIDE: 99 mmol/L — AB (ref 101–111)
CO2: 22 mmol/L (ref 22–32)
CREATININE: 0.97 mg/dL (ref 0.44–1.00)
GFR calc non Af Amer: >60 mL/min (ref >60)
GLUCOSE: 422 mg/dL — AB (ref 65–99)
Potassium: 4.5 mmol/L (ref 3.5–5.1)
SODIUM: 135 mmol/L (ref 135–145)
Total Bilirubin: 1 mg/dL (ref 0.3–1.2)
Total Protein: 8.4 g/dL — ABNORMAL HIGH (ref 6.5–8.1)

## 2015-06-13 LAB — URINALYSIS, ROUTINE W REFLEX MICROSCOPIC
BILIRUBIN URINE: NEGATIVE
Nitrite: NEGATIVE
PROTEIN: NEGATIVE mg/dL
Specific Gravity, Urine: 1.015 (ref 1.005–1.030)
pH: 6.5 (ref 5.0–8.0)

## 2015-06-13 LAB — URINE MICROSCOPIC-ADD ON

## 2015-06-13 LAB — CBG MONITORING, ED: GLUCOSE-CAPILLARY: 338 mg/dL — AB (ref 65–99)

## 2015-06-13 LAB — CBC
HCT: 46.7 % — ABNORMAL HIGH (ref 36.0–46.0)
Hemoglobin: 17 g/dL — ABNORMAL HIGH (ref 12.0–15.0)
MCH: 34.3 pg — AB (ref 26.0–34.0)
MCHC: 36.4 g/dL — AB (ref 30.0–36.0)
MCV: 94.2 fL (ref 78.0–100.0)
PLATELETS: 222 10*3/uL (ref 150–400)
RBC: 4.96 MIL/uL (ref 3.87–5.11)
RDW: 11.8 % (ref 11.5–15.5)
WBC: 8.6 10*3/uL (ref 4.0–10.5)

## 2015-06-13 LAB — LIPASE, BLOOD: LIPASE: 30 U/L (ref 11–51)

## 2015-06-13 NOTE — ED Notes (Signed)
No answer in lobby.

## 2015-06-13 NOTE — ED Notes (Signed)
Lab work reviewed by Dr Sabra Heck, who advised to call pt to return to er for treatment,

## 2015-06-13 NOTE — ED Notes (Signed)
Pt states she had had several episodes of emesis since yesterday. Has epigastric pain that worsens with vomiting. NAD noted.

## 2015-06-13 NOTE — ED Notes (Addendum)
Called and left a message at [phone] for pt to return call to charge RN,

## 2015-06-14 NOTE — ED Notes (Signed)
Pt called back and reports she went to [facility] last night after leaving here and received treatment.

## 2015-07-21 ENCOUNTER — Encounter (HOSPITAL_COMMUNITY): Payer: Self-pay | Admitting: Emergency Medicine

## 2015-07-21 ENCOUNTER — Emergency Department (HOSPITAL_COMMUNITY)
Admission: EM | Admit: 2015-07-21 | Discharge: 2015-07-21 | Disposition: A | Discharge status: Home / Self Care | Payer: BLUE CROSS/BLUE SHIELD | Attending: Emergency Medicine | Admitting: Emergency Medicine

## 2015-07-21 DIAGNOSIS — M545 Low back pain, unspecified: Secondary | ICD-10-CM

## 2015-07-21 DIAGNOSIS — Z794 Long term (current) use of insulin: Secondary | ICD-10-CM | POA: Insufficient documentation

## 2015-07-21 DIAGNOSIS — F17210 Nicotine dependence, cigarettes, uncomplicated: Secondary | ICD-10-CM | POA: Diagnosis not present

## 2015-07-21 DIAGNOSIS — E1151 Type 2 diabetes mellitus with diabetic peripheral angiopathy without gangrene: Secondary | ICD-10-CM | POA: Insufficient documentation

## 2015-07-21 DIAGNOSIS — Z79899 Other long term (current) drug therapy: Secondary | ICD-10-CM | POA: Diagnosis not present

## 2015-07-21 DIAGNOSIS — E1165 Type 2 diabetes mellitus with hyperglycemia: Secondary | ICD-10-CM | POA: Insufficient documentation

## 2015-07-21 DIAGNOSIS — J45909 Unspecified asthma, uncomplicated: Secondary | ICD-10-CM | POA: Diagnosis not present

## 2015-07-21 DIAGNOSIS — Z7982 Long term (current) use of aspirin: Secondary | ICD-10-CM | POA: Insufficient documentation

## 2015-07-21 DIAGNOSIS — R109 Unspecified abdominal pain: Secondary | ICD-10-CM | POA: Diagnosis present

## 2015-07-21 DIAGNOSIS — R739 Hyperglycemia, unspecified: Secondary | ICD-10-CM

## 2015-07-21 LAB — BASIC METABOLIC PANEL
ANION GAP: 13 (ref 5–15)
BUN: 11 mg/dL (ref 6–20)
CALCIUM: 9.4 mg/dL (ref 8.9–10.3)
CO2: 25 mmol/L (ref 22–32)
Chloride: 103 mmol/L (ref 101–111)
Creatinine, Ser: 0.67 mg/dL (ref 0.44–1.00)
GFR calc Af Amer: >60 mL/min (ref >60)
Glucose, Bld: 315 mg/dL — ABNORMAL HIGH (ref 65–99)
POTASSIUM: 4 mmol/L (ref 3.5–5.1)
Sodium: 141 mmol/L (ref 135–145)

## 2015-07-21 LAB — URINALYSIS, ROUTINE W REFLEX MICROSCOPIC
BILIRUBIN URINE: NEGATIVE
Glucose, UA: >1000 mg/dL — AB
HGB URINE DIPSTICK: NEGATIVE
KETONES UR: 15 mg/dL — AB
Leukocytes, UA: NEGATIVE
Nitrite: NEGATIVE
PROTEIN: NEGATIVE mg/dL
Specific Gravity, Urine: 1.01 (ref 1.005–1.030)
pH: 5.5 (ref 5.0–8.0)

## 2015-07-21 LAB — CBC
HCT: 41.7 % (ref 36.0–46.0)
HEMOGLOBIN: 14.6 g/dL (ref 12.0–15.0)
MCH: 33 pg (ref 26.0–34.0)
MCHC: 35 g/dL (ref 30.0–36.0)
MCV: 94.1 fL (ref 78.0–100.0)
Platelets: 215 10*3/uL (ref 150–400)
RBC: 4.43 MIL/uL (ref 3.87–5.11)
RDW: 12.1 % (ref 11.5–15.5)
WBC: 8.4 10*3/uL (ref 4.0–10.5)

## 2015-07-21 LAB — URINE MICROSCOPIC-ADD ON: RBC / HPF: NONE SEEN RBC/hpf (ref 0–5)

## 2015-07-21 LAB — CBG MONITORING, ED: GLUCOSE-CAPILLARY: 317 mg/dL — AB (ref 65–99)

## 2015-07-21 MED ORDER — IBUPROFEN 800 MG PO TABS
800.0000 mg | ORAL_TABLET | Freq: Once | ORAL | Status: AC
Start: 2015-07-21 — End: 2015-07-21
  Administered 2015-07-21: 800 mg via ORAL
  Filled 2015-07-21: qty 1

## 2015-07-21 MED ORDER — METHOCARBAMOL 500 MG PO TABS: 500.0000 mg | ORAL_TABLET | Freq: Two times a day (BID) | ORAL | Status: DC | PRN

## 2015-07-21 MED ORDER — IBUPROFEN 800 MG PO TABS: 800.0000 mg | ORAL_TABLET | Freq: Three times a day (TID) | ORAL | Status: DC

## 2015-07-21 NOTE — ED Notes (Addendum)
Foul smelling vaginal discharge.  Bilateral flank pain 10/10 and tick bite four days ago.  C/o chills and vomiting.  Vomited 6 times in last 24 hours.

## 2015-07-21 NOTE — Discharge Instructions (Signed)

## 2015-07-21 NOTE — ED Notes (Signed)
Lab at the bedside 

## 2015-07-21 NOTE — ED Notes (Signed)
Patient ambulatory to restroom with steady gait.

## 2015-07-21 NOTE — ED Provider Notes (Signed)
CSN: [id number]     Arrival date & time 07/21/15  1559 History   First MD Initiated Contact with Patient 07/21/15 1702     Chief Complaint  Patient presents with  . Vaginal Discharge     (Consider location/radiation/quality/duration/timing/severity/associated sxs/prior Treatment) HPI  The pt is a 50 y/o female, She has a known history of recurrent kidney infections, she was most recently treated with an antibiotic for 10 days after being seen at [facility] and being diagnosed with pyelonephritis. She reports several days of increasing amounts of bilateral flank pain, she has also had burning with urination and foul-smelling urine. She says that she is having subjective fevers and chills and has had some vomiting as well. She denies abdominal pain, denies swelling of the legs, denies numbness or weakness of her legs, denies any injuries to her back. This pain in her back seems to get a little bit better when she leans forward and worse when she stands up. She reports that as recently as a week ago she was jogging approximately 1 mile a day. Without difficulty  Past Medical History  Diagnosis Date  . Asthma   . Diabetes mellitus   . Hypertension   . PVD (peripheral vascular disease) ([place])   . DDD (degenerative disc disease), lumbar    Past Surgical History  Procedure Laterality Date  . Femoral artery stent  right leg  . Back surgery    . Coronary angioplasty with stent placement    . Coronary artery bypass graft     Family History  Problem Relation Age of Onset  . Stroke Mother   . Hypertension Mother   . Heart failure Father    Social History  Substance Use Topics  . Smoking status: Current Every Day Smoker -- 0.50 packs/day for 30 years    Types: Cigarettes  . Smokeless tobacco: Never Used  . Alcohol Use: No   OB History    Gravida Para Term Preterm AB TAB SAB Ectopic Multiple Living   2 1  1 1  1   1      Review of Systems  All other systems reviewed and are  negative.     Allergies  Review of patient's allergies indicates no known allergies.  Home Medications   Prior to Admission medications   Medication Sig Start Date End Date Taking? Authorizing Provider  albuterol (PROVENTIL HFA;VENTOLIN HFA) 108 (90 Base) MCG/ACT inhaler Inhale 1-2 puffs into the lungs every 6 (six) hours as needed for wheezing or shortness of breath. 05/19/15  Yes Evalee Jefferson, PA-C  aspirin EC 81 MG tablet Take 81 mg by mouth daily.   Yes Historical Provider, MD  Carboxymethylcellul-Glycerin (CLEAR EYES FOR DRY EYES OP) Place 2 drops into both eyes 2 (two) times daily.   Yes Historical Provider, MD  GARLIC PO Take 1 tablet by mouth daily.   Yes Historical Provider, MD  insulin detemir (LEVEMIR) 100 UNIT/ML injection Inject 15 Units into the skin at bedtime.   Yes Historical Provider, MD  lisinopril (PRINIVIL,ZESTRIL) 20 MG tablet Take 20 mg by mouth daily.   Yes Historical Provider, MD  Multiple Vitamin (MULTIVITAMIN WITH MINERALS) TABS tablet Take 1 tablet by mouth daily.   Yes Historical Provider, MD  ibuprofen (ADVIL,MOTRIN) 800 MG tablet Take 1 tablet (800 mg total) by mouth 3 (three) times daily. 07/21/15   Noemi Chapel, MD  methocarbamol (ROBAXIN) 500 MG tablet Take 1 tablet (500 mg total) by mouth 2 (two) times daily as needed  for muscle spasms. 07/21/15   Noemi Chapel, MD   BP 161/72 mmHg  Pulse 100  Temp(Src) 98.2 F (36.8 C) (Oral)  Resp 16  Ht 5\' 4"  (1.626 m)  Wt 163 lb (73.936 kg)  BMI 27.97 kg/m2  SpO2 100% Physical Exam  Constitutional: She appears well-developed and well-nourished. No distress.  HENT:  Head: Normocephalic and atraumatic.  Mouth/Throat: Oropharynx is clear and moist. No oropharyngeal exudate.  Eyes: Conjunctivae and EOM are normal. Pupils are equal, round, and reactive to light. Right eye exhibits no discharge. Left eye exhibits no discharge. No scleral icterus.  Neck: Normal range of motion. Neck supple. No JVD present. No thyromegaly  present.  Cardiovascular: Normal rate, regular rhythm, normal heart sounds and intact distal pulses.  Exam reveals no gallop and no friction rub.   No murmur heard. Pulmonary/Chest: Effort normal and breath sounds normal. No respiratory distress. She has no wheezes. She has no rales.  Abdominal: Soft. Bowel sounds are normal. She exhibits no distension and no mass. There is no tenderness.  Soft nontender abdomen, no masses, no guarding, lots of excessive skin  Musculoskeletal: Normal range of motion. She exhibits tenderness ( No tenderness on the right, left CVA tenderness present). She exhibits no edema.  Supple joints and soft compartments diffusely  Lymphadenopathy:    She has no cervical adenopathy.  Neurological: She is alert. Coordination normal.  Normal strength sensation and reflexes in the bilateral lower extremities below the hips  Skin: Skin is warm and dry. No rash noted. No erythema.  Psychiatric: She has a normal mood and affect. Her behavior is normal.  Nursing note and vitals reviewed.   ED Course  Procedures (including critical care time) Labs Review Labs Reviewed  URINALYSIS, ROUTINE W REFLEX MICROSCOPIC (NOT AT [facility]) - Abnormal; Notable for the following:    Glucose, UA >1000 (*)    Ketones, ur 15 (*)    All other components within normal limits  URINE MICROSCOPIC-ADD ON - Abnormal; Notable for the following:    Squamous Epithelial / LPF 0-5 (*)    Bacteria, UA RARE (*)    All other components within normal limits  BASIC METABOLIC PANEL - Abnormal; Notable for the following:    Glucose, Bld 315 (*)    All other components within normal limits  CBG MONITORING, ED - Abnormal; Notable for the following:    Glucose-Capillary 317 (*)    All other components within normal limits  CBC    Imaging Review No results found. I have personally reviewed and evaluated these images and lab results as part of my medical decision-making.    MDM   Final diagnoses:   Bilateral low back pain without sciatica  Hyperglycemia    The patient has bilateral flank pain, she has signs of possible pyelonephritis, her vital sign showed that she is afebrile and has slight hypertension, no signs of sepsis. Urinalysis pending, ibuprofen requested  Urinalysis negative, basic metabolic panel shows no signs of diabetic ketoacidosis, vital signs otherwise show minimal hypertension, hyperglycemia without ketoacidosis, stable for discharge, patient informed this is likely musculoskeletal pain.  Meds given in ED:  Medications  ibuprofen (ADVIL,MOTRIN) tablet 800 mg (800 mg Oral Given 07/21/15 1752)    New Prescriptions   IBUPROFEN (ADVIL,MOTRIN) 800 MG TABLET    Take 1 tablet (800 mg total) by mouth 3 (three) times daily.   METHOCARBAMOL (ROBAXIN) 500 MG TABLET    Take 1 tablet (500 mg total) by mouth 2 (two) times  daily as needed for muscle spasms.      Noemi Chapel, MD 07/21/15 [phone]

## 2015-07-21 NOTE — ED Notes (Signed)
Med student at the bedside.

## 2015-07-21 NOTE — ED Notes (Signed)
Patient given discharge instruction, verbalized understand. Patient ambulatory out of the department.  

## 2015-07-21 NOTE — ED Notes (Signed)
Pelvic supplies set up in the room

## 2015-07-28 ENCOUNTER — Encounter (HOSPITAL_COMMUNITY): Payer: Self-pay | Admitting: Emergency Medicine

## 2015-07-28 ENCOUNTER — Emergency Department (HOSPITAL_COMMUNITY)
Admission: EM | Admit: 2015-07-28 | Discharge: 2015-07-28 | Disposition: A | Discharge status: Home / Self Care | Payer: BLUE CROSS/BLUE SHIELD | Attending: Emergency Medicine | Admitting: Emergency Medicine

## 2015-07-28 ENCOUNTER — Emergency Department (HOSPITAL_COMMUNITY): Payer: BLUE CROSS/BLUE SHIELD

## 2015-07-28 DIAGNOSIS — Y999 Unspecified external cause status: Secondary | ICD-10-CM | POA: Diagnosis not present

## 2015-07-28 DIAGNOSIS — Y929 Unspecified place or not applicable: Secondary | ICD-10-CM | POA: Diagnosis not present

## 2015-07-28 DIAGNOSIS — E119 Type 2 diabetes mellitus without complications: Secondary | ICD-10-CM | POA: Insufficient documentation

## 2015-07-28 DIAGNOSIS — Y939 Activity, unspecified: Secondary | ICD-10-CM | POA: Diagnosis not present

## 2015-07-28 DIAGNOSIS — I739 Peripheral vascular disease, unspecified: Secondary | ICD-10-CM | POA: Insufficient documentation

## 2015-07-28 DIAGNOSIS — J45909 Unspecified asthma, uncomplicated: Secondary | ICD-10-CM | POA: Diagnosis not present

## 2015-07-28 DIAGNOSIS — Z794 Long term (current) use of insulin: Secondary | ICD-10-CM | POA: Insufficient documentation

## 2015-07-28 DIAGNOSIS — W109XXA Fall (on) (from) unspecified stairs and steps, initial encounter: Secondary | ICD-10-CM | POA: Insufficient documentation

## 2015-07-28 DIAGNOSIS — Z791 Long term (current) use of non-steroidal anti-inflammatories (NSAID): Secondary | ICD-10-CM | POA: Insufficient documentation

## 2015-07-28 DIAGNOSIS — I10 Essential (primary) hypertension: Secondary | ICD-10-CM | POA: Insufficient documentation

## 2015-07-28 DIAGNOSIS — S99911A Unspecified injury of right ankle, initial encounter: Secondary | ICD-10-CM | POA: Diagnosis present

## 2015-07-28 DIAGNOSIS — S8251XA Displaced fracture of medial malleolus of right tibia, initial encounter for closed fracture: Secondary | ICD-10-CM | POA: Insufficient documentation

## 2015-07-28 DIAGNOSIS — F17210 Nicotine dependence, cigarettes, uncomplicated: Secondary | ICD-10-CM | POA: Insufficient documentation

## 2015-07-28 MED ORDER — HYDROCODONE-ACETAMINOPHEN 5-325 MG PO TABS: 1.0000 | ORAL_TABLET | ORAL | Status: DC | PRN

## 2015-07-28 MED ORDER — IBUPROFEN 400 MG PO TABS
400.0000 mg | ORAL_TABLET | Freq: Once | ORAL | Status: DC
  Filled 2015-07-28: qty 1

## 2015-07-28 NOTE — ED Provider Notes (Signed)
CSN: [id number]     Arrival date & time 07/28/15  2055 History   First MD Initiated Contact with Patient 07/28/15 2131     Chief Complaint  Patient presents with  . Leg Pain     (Consider location/radiation/quality/duration/timing/severity/associated sxs/prior Treatment) HPI Alexandria Webster is a 50 y.o. female who presents to the ED with right ankle pain s/p fall approximately 3 hours ago. She reports going down steps and slipped. She has taken nothing for pain. She denies any other problems tonight. Patient reports she took ibuprofen 800 mg prior to coming to the ED.  Past Medical History  Diagnosis Date  . Asthma   . Diabetes mellitus   . Hypertension   . PVD (peripheral vascular disease) ([place])   . DDD (degenerative disc disease), lumbar    Past Surgical History  Procedure Laterality Date  . Femoral artery stent  right leg  . Back surgery    . Coronary angioplasty with stent placement    . Coronary artery bypass graft     Family History  Problem Relation Age of Onset  . Stroke Mother   . Hypertension Mother   . Heart failure Father    Social History  Substance Use Topics  . Smoking status: Current Every Day Smoker -- 0.50 packs/day for 30 years    Types: Cigarettes  . Smokeless tobacco: Never Used  . Alcohol Use: No   OB History    Gravida Para Term Preterm AB TAB SAB Ectopic Multiple Living   2 1  1 1  1   1      Review of Systems Negative except as stated in HPI   Allergies  Review of patient's allergies indicates no known allergies.  Home Medications   Prior to Admission medications   Medication Sig Start Date End Date Taking? Authorizing Provider  albuterol (PROVENTIL HFA;VENTOLIN HFA) 108 (90 Base) MCG/ACT inhaler Inhale 1-2 puffs into the lungs every 6 (six) hours as needed for wheezing or shortness of breath. 05/19/15   Evalee Jefferson, PA-C  aspirin EC 81 MG tablet Take 81 mg by mouth daily.    Historical Provider, MD  Carboxymethylcellul-Glycerin (CLEAR  EYES FOR DRY EYES OP) Place 2 drops into both eyes 2 (two) times daily.    Historical Provider, MD  GARLIC PO Take 1 tablet by mouth daily.    Historical Provider, MD  HYDROcodone-acetaminophen (NORCO/VICODIN) 5-325 MG tablet Take 1 tablet by mouth every 4 (four) hours as needed. 07/28/15   Hope Bunnie Pion, NP  HYDROcodone-acetaminophen (NORCO/VICODIN) 5-325 MG tablet Take 1 tablet by mouth every 6 (six) hours as needed for moderate pain (Must last 15 days.Do not take and drive a car or use machinery.). 07/30/15   Sanjuana Kava, MD  ibuprofen (ADVIL,MOTRIN) 800 MG tablet Take 1 tablet (800 mg total) by mouth 3 (three) times daily. 07/21/15   Noemi Chapel, MD  insulin detemir (LEVEMIR) 100 UNIT/ML injection Inject 15 Units into the skin at bedtime.    Historical Provider, MD  lisinopril (PRINIVIL,ZESTRIL) 20 MG tablet Take 20 mg by mouth daily.    Historical Provider, MD  methocarbamol (ROBAXIN) 500 MG tablet Take 1 tablet (500 mg total) by mouth 2 (two) times daily as needed for muscle spasms. 07/21/15   Noemi Chapel, MD  Multiple Vitamin (MULTIVITAMIN WITH MINERALS) TABS tablet Take 1 tablet by mouth daily.    Historical Provider, MD   BP 164/87 mmHg  Pulse 93  Temp(Src) 98.6 F (37 C) (Oral)  Resp  18  Ht 5\' 4"  (1.626 m)  Wt 73.936 kg  BMI 27.97 kg/m2  SpO2 100% Physical Exam  Constitutional: She is oriented to person, place, and time. She appears well-developed and well-nourished.  HENT:  Head: Normocephalic and atraumatic.  Eyes: EOM are normal.  Neck: Neck supple.  Cardiovascular: Normal rate.   Pulmonary/Chest: Effort normal.  Musculoskeletal:       Right ankle: She exhibits swelling and ecchymosis. She exhibits no deformity, no laceration and normal pulse. Decreased range of motion: due to pain. Tenderness. Lateral malleolus tenderness found. Achilles tendon normal.  Pedal pulses 2+, adequate circulation.   Neurological: She is alert and oriented to person, place, and time. No cranial  nerve deficit.  Skin: Skin is warm and dry.  Psychiatric: She has a normal mood and affect. Her behavior is normal.  Nursing note and vitals reviewed.   ED Course  Procedures (including critical care time) Labs Review Labs Reviewed - No data to display  Dg Ankle Complete Right  07/28/2015  CLINICAL DATA:  Initial valuation for acute trauma, right ankle pain. EXAM: RIGHT ANKLE - COMPLETE 3+ VIEW COMPARISON:  None. FINDINGS: Thin linear lucency traverses the distal aspect of the medial malleolus, suspicious for an acute nondisplaced fracture. No other acute fracture identified. Talar dome intact. Ankle mortise approximated. Mild diffuse soft tissue swelling about the ankle. Probable joint effusion noted. Prominent posterior and plantar calcaneal enthesophytes noted. IMPRESSION: 1. Thin linear lucency traversing the distal aspect of the medial malleolus, suspicious for an acute nondisplaced fracture. 2. Diffuse soft tissue swelling about the ankle with probable joint effusion. Electronically Signed   By: Jeannine Boga M.D.   On: 07/28/2015 22:39    I reviewed the x-rays with Dr. Roderic Palau and discussed the case with him.  MDM  50 y.o. female with right ankle pain stable for d/c without focal neuro deficits. Will treat for pain and inflammation and instructions on ice, elevation and follow up with ortho. Cam walker. Discussed with the patient and all questioned fully answered. She will f/u with ortho or return if any problems arise.   Final diagnoses:  Fracture of medial malleolus, closed, right, initial encounter        [facility], NP 07/30/15 1930  Milton Ferguson, MD 08/03/15 [phone]

## 2015-07-28 NOTE — Discharge Instructions (Signed)
Ankle Fracture A fracture is a break in a bone. A cast or splint may be used to protect the ankle and heal the break. Sometimes, surgery is needed. HOME CARE  Use crutches as told by your doctor. It is very important that you use your crutches correctly.  Do not put weight or pressure on the injured ankle until told by your doctor.  Keep your ankle raised (elevated) when sitting or lying down.  Apply ice to the ankle:  Put ice in a plastic bag.  Place a towel between your cast and the bag.  Leave the ice on for 20 minutes, 2-3 times a day.  If you have a plaster or fiberglass cast:  Do not try to scratch under the cast with any objects.  Check the skin around the cast every day. You may put lotion on red or sore areas.  Keep your cast dry and clean.  If you have a plaster splint:  Wear the splint as told by your doctor.  You can loosen the elastic around the splint if your toes get numb, tingle, or turn cold or blue.  Do not put pressure on any part of your cast or splint. It may break. Rest your plaster splint or cast only on a pillow the first 24 hours until it is fully hardened.  Cover your cast or splint with a plastic bag during showers.  Do not lower your cast or splint into water.  Take medicine as told by your doctor.  Do not drive until your doctor says it is safe.  Follow-up with your doctor as told. It is very important that you go to your follow-up visits. GET HELP IF: The swelling and discomfort gets worse.  GET HELP RIGHT AWAY IF:   Your splint or cast breaks.  You continue to have very bad pain.  You have new pain or swelling after your splint or cast was put on.  Your skin or toes below the injured ankle:  Turn blue or gray.  Feel cold, numb, or you cannot feel them.  There is a bad smell or yellowish white fluid (pus) coming from under the splint or cast. MAKE SURE YOU:   Understand these instructions.  Will watch your  condition.  Will get help right away if you are not doing well or get worse.   This information is not intended to replace advice given to you by your health care provider. Make sure you discuss any questions you have with your health care provider.   Document Released: 12/26/2008 Document Revised: 12/19/2012 Document Reviewed: 09/27/2012 Elsevier Interactive Patient Education 2016 [place] or Splint Care Casts and splints support injured limbs and keep bones from moving while they heal. It is important to care for your cast or splint at home.  HOME CARE INSTRUCTIONS  Keep the cast or splint uncovered during the drying period. It can take 24 to 48 hours to dry if it is made of plaster. A fiberglass cast will dry in less than 1 hour.  Do not rest the cast on anything harder than a pillow for the first 24 hours.  Do not put weight on your injured limb or apply pressure to the cast until your health care provider gives you permission.  Keep the cast or splint dry. Wet casts or splints can lose their shape and may not support the limb as well. A wet cast that has lost its shape can also create harmful pressure on your  skin when it dries. Also, wet skin can become infected.  Cover the cast or splint with a plastic bag when bathing or when out in the rain or snow. If the cast is on the trunk of the body, take sponge baths until the cast is removed.  If your cast does become wet, dry it with a towel or a blow dryer on the cool setting only.  Keep your cast or splint clean. Soiled casts may be wiped with a moistened cloth.  Do not place any hard or soft foreign objects under your cast or splint, such as cotton, toilet paper, lotion, or powder.  Do not try to scratch the skin under the cast with any object. The object could get stuck inside the cast. Also, scratching could lead to an infection. If itching is a problem, use a blow dryer on a cool setting to relieve discomfort.  Do  not trim or cut your cast or remove padding from inside of it.  Exercise all joints next to the injury that are not immobilized by the cast or splint. For example, if you have a long leg cast, exercise the hip joint and toes. If you have an arm cast or splint, exercise the shoulder, elbow, thumb, and fingers.  Elevate your injured arm or leg on 1 or 2 pillows for the first 1 to 3 days to decrease swelling and pain.It is best if you can comfortably elevate your cast so it is higher than your heart. SEEK MEDICAL CARE IF:   Your cast or splint cracks.  Your cast or splint is too tight or too loose.  You have unbearable itching inside the cast.  Your cast becomes wet or develops a soft spot or area.  You have a bad smell coming from inside your cast.  You get an object stuck under your cast.  Your skin around the cast becomes red or raw.  You have new pain or worsening pain after the cast has been applied. SEEK IMMEDIATE MEDICAL CARE IF:   You have fluid leaking through the cast.  You are unable to move your fingers or toes.  You have discolored (blue or white), cool, painful, or very swollen fingers or toes beyond the cast.  You have tingling or numbness around the injured area.  You have severe pain or pressure under the cast.  You have any difficulty with your breathing or have shortness of breath.  You have chest pain.   This information is not intended to replace advice given to you by your health care provider. Make sure you discuss any questions you have with your health care provider.   Document Released: 02/26/2000 Document Revised: 12/19/2012 Document Reviewed: 09/06/2012 Elsevier Interactive Patient Education [employer].

## 2015-07-28 NOTE — ED Notes (Signed)
Pt tripped and [address] going down steps around 7pm tonight, complaining of right leg pain

## 2015-07-30 ENCOUNTER — Ambulatory Visit (INDEPENDENT_AMBULATORY_CARE_PROVIDER_SITE_OTHER): Payer: BLUE CROSS/BLUE SHIELD | Admitting: Orthopaedic Surgery

## 2015-07-30 ENCOUNTER — Encounter: Payer: Self-pay | Admitting: Orthopaedic Surgery

## 2015-07-30 VITALS — BP 131/70 | HR 94 | Temp 97.7°F | Ht 64.0 in | Wt 163.0 lb

## 2015-07-30 DIAGNOSIS — I10 Essential (primary) hypertension: Secondary | ICD-10-CM | POA: Diagnosis not present

## 2015-07-30 DIAGNOSIS — I739 Peripheral vascular disease, unspecified: Secondary | ICD-10-CM | POA: Diagnosis not present

## 2015-07-30 DIAGNOSIS — S8251XA Displaced fracture of medial malleolus of right tibia, initial encounter for closed fracture: Secondary | ICD-10-CM | POA: Diagnosis not present

## 2015-07-30 MED ORDER — HYDROCODONE-ACETAMINOPHEN 5-325 MG PO TABS: 1.0000 | ORAL_TABLET | Freq: Four times a day (QID) | ORAL | Status: DC | PRN

## 2015-07-30 NOTE — Progress Notes (Signed)
Subjective:  I broke my ankle    Patient ID: Alexandria Webster, female    DOB: December 07, 1965, 50 y.o.   MRN: [id number]  Ankle Injury  The incident occurred 2 days ago. The incident occurred at home. The injury mechanism was a fall. The pain is present in the right ankle. The quality of the pain is described as aching. The pain is at a severity of 4/10. The pain is moderate. The pain has been improving since onset. Associated symptoms include a loss of motion. Pertinent negatives include no inability to bear weight, loss of sensation, muscle weakness, numbness or tingling. The symptoms are aggravated by weight bearing. She has tried ice, immobilization, NSAIDs and rest for the symptoms. The treatment provided moderate relief.  She fell two days ago at home and hurt her right ankle. She was seen in the ER. She has small avulsion fracture of the right medial malleolus.  She was given a CAM walker. She has no other injury.  She smokes.  She has hypertension well controlled.  She has COPD.  She has diabetes on insulin.  Last A1C was less than 6.  Review of Systems  HENT: Negative for congestion.   Respiratory: Positive for shortness of breath. Negative for cough.   Endocrine: Positive for cold intolerance.  Musculoskeletal: Positive for joint swelling, arthralgias and gait problem.  Allergic/Immunologic: Positive for environmental allergies.  Neurological: Negative for tingling and numbness.  Psychiatric/Behavioral: The patient is nervous/anxious.    Past Medical History  Diagnosis Date  . Asthma   . Diabetes mellitus   . Hypertension   . PVD (peripheral vascular disease) ([place])   . DDD (degenerative disc disease), lumbar     Past Surgical History  Procedure Laterality Date  . Femoral artery stent  right leg  . Back surgery    . Coronary angioplasty with stent placement    . Coronary artery bypass graft      Current Outpatient Prescriptions on File Prior to Visit  Medication Sig  Dispense Refill  . albuterol (PROVENTIL HFA;VENTOLIN HFA) 108 (90 Base) MCG/ACT inhaler Inhale 1-2 puffs into the lungs every 6 (six) hours as needed for wheezing or shortness of breath. 1 Inhaler 0  . aspirin EC 81 MG tablet Take 81 mg by mouth daily.    . Carboxymethylcellul-Glycerin (CLEAR EYES FOR DRY EYES OP) Place 2 drops into both eyes 2 (two) times daily.    Marland Kitchen GARLIC PO Take 1 tablet by mouth daily.    Marland Kitchen HYDROcodone-acetaminophen (NORCO/VICODIN) 5-325 MG tablet Take 1 tablet by mouth every 4 (four) hours as needed. 10 tablet 0  . ibuprofen (ADVIL,MOTRIN) 800 MG tablet Take 1 tablet (800 mg total) by mouth 3 (three) times daily. 21 tablet 0  . insulin detemir (LEVEMIR) 100 UNIT/ML injection Inject 15 Units into the skin at bedtime.    Marland Kitchen lisinopril (PRINIVIL,ZESTRIL) 20 MG tablet Take 20 mg by mouth daily.    . methocarbamol (ROBAXIN) 500 MG tablet Take 1 tablet (500 mg total) by mouth 2 (two) times daily as needed for muscle spasms. 20 tablet 0  . Multiple Vitamin (MULTIVITAMIN WITH MINERALS) TABS tablet Take 1 tablet by mouth daily.     No current facility-administered medications on file prior to visit.    Social History   Social History  . Marital Status: Married    Spouse Name: N/A  . Number of Children: N/A  . Years of Education: N/A   Occupational History  . Not on  file.   Social History Main Topics  . Smoking status: Current Every Day Smoker -- 0.50 packs/day for 30 years    Types: Cigarettes  . Smokeless tobacco: Never Used  . Alcohol Use: No  . Drug Use: No  . Sexual Activity: Yes    Birth Control/ Protection: None   Other Topics Concern  . Not on file   Social History Narrative    BP 131/70 mmHg  Pulse 94  Temp(Src) 97.7 F (36.5 C)  Ht 5\' 4"  (1.626 m)  Wt 163 lb (73.936 kg)  BMI 27.97 kg/m2      Objective:   Physical Exam  Constitutional: She is oriented to person, place, and time. She appears well-developed and well-nourished.  HENT:  Head:  Normocephalic and atraumatic.  Eyes: Conjunctivae and EOM are normal. Pupils are equal, round, and reactive to light.  Neck: Normal range of motion. Neck supple.  Cardiovascular: Normal rate, regular rhythm and intact distal pulses.   Pulmonary/Chest: Effort normal.  Abdominal: Soft.  Musculoskeletal: She exhibits tenderness (Pain and swelling of the right ankle, more pain medially.  Ecchymosis present.  Decreased motion.  NV intact. Has CAM walker.).  Neurological: She is alert and oriented to person, place, and time. She displays normal reflexes. No cranial nerve deficit. She exhibits normal muscle tone. Coordination normal.  Skin: Skin is warm and dry.  Psychiatric: She has a normal mood and affect. Her behavior is normal. Judgment and thought content normal.          Assessment & Plan:   Encounter Diagnoses  Name Primary?  . Fracture of medial malleolus, right, closed, initial encounter Yes  . Essential hypertension   . PVD (peripheral vascular disease) ([place])    Continue CAM walker.  Instructions for Contrast Baths given.  Return in two weeks.  X-rays then of the right ankle.  Call if any problem.  Precautions discussed.

## 2015-08-02 ENCOUNTER — Emergency Department (HOSPITAL_COMMUNITY): Payer: BLUE CROSS/BLUE SHIELD

## 2015-08-02 ENCOUNTER — Encounter (HOSPITAL_COMMUNITY): Payer: Self-pay | Admitting: Emergency Medicine

## 2015-08-02 ENCOUNTER — Emergency Department (HOSPITAL_COMMUNITY)
Admission: EM | Admit: 2015-08-02 | Discharge: 2015-08-02 | Disposition: A | Discharge status: Home / Self Care | Payer: BLUE CROSS/BLUE SHIELD | Attending: Emergency Medicine | Admitting: Emergency Medicine

## 2015-08-02 DIAGNOSIS — F17210 Nicotine dependence, cigarettes, uncomplicated: Secondary | ICD-10-CM | POA: Diagnosis not present

## 2015-08-02 DIAGNOSIS — Y9301 Activity, walking, marching and hiking: Secondary | ICD-10-CM | POA: Diagnosis not present

## 2015-08-02 DIAGNOSIS — E119 Type 2 diabetes mellitus without complications: Secondary | ICD-10-CM | POA: Diagnosis not present

## 2015-08-02 DIAGNOSIS — Y999 Unspecified external cause status: Secondary | ICD-10-CM | POA: Diagnosis not present

## 2015-08-02 DIAGNOSIS — Z79899 Other long term (current) drug therapy: Secondary | ICD-10-CM | POA: Diagnosis not present

## 2015-08-02 DIAGNOSIS — Z794 Long term (current) use of insulin: Secondary | ICD-10-CM | POA: Insufficient documentation

## 2015-08-02 DIAGNOSIS — Y929 Unspecified place or not applicable: Secondary | ICD-10-CM | POA: Diagnosis not present

## 2015-08-02 DIAGNOSIS — W01198A Fall on same level from slipping, tripping and stumbling with subsequent striking against other object, initial encounter: Secondary | ICD-10-CM | POA: Insufficient documentation

## 2015-08-02 DIAGNOSIS — Z7982 Long term (current) use of aspirin: Secondary | ICD-10-CM | POA: Diagnosis not present

## 2015-08-02 DIAGNOSIS — J45909 Unspecified asthma, uncomplicated: Secondary | ICD-10-CM | POA: Diagnosis not present

## 2015-08-02 DIAGNOSIS — S20212A Contusion of left front wall of thorax, initial encounter: Secondary | ICD-10-CM | POA: Insufficient documentation

## 2015-08-02 DIAGNOSIS — I739 Peripheral vascular disease, unspecified: Secondary | ICD-10-CM | POA: Diagnosis not present

## 2015-08-02 DIAGNOSIS — I10 Essential (primary) hypertension: Secondary | ICD-10-CM | POA: Diagnosis not present

## 2015-08-02 DIAGNOSIS — S299XXA Unspecified injury of thorax, initial encounter: Secondary | ICD-10-CM | POA: Diagnosis present

## 2015-08-02 NOTE — Discharge Instructions (Signed)
Chest Contusion °A contusion is a deep bruise. Bruises happen when an injury causes bleeding under the skin. Signs of bruising include pain, puffiness (swelling), and discolored skin. The bruise may turn blue, purple, or yellow.  °HOME CARE °· Put ice on the injured area. °¨ Put ice in a plastic bag. °¨ Place a towel between the skin and the bag. °¨ Leave the ice on for 15-20 minutes at a time, 03-04 times a day for the first 48 hours. °· Only take medicine as told by your doctor. °· Rest. °· Take deep breaths (deep-breathing exercises) as told by your doctor. °· Stop smoking if you smoke. °· Do not lift objects over 5 pounds (2.3 kilograms) for 3 days or longer if told by your doctor. °GET HELP RIGHT AWAY IF:  °· You have more bruising or puffiness. °· You have pain that gets worse. °· You have trouble breathing. °· You are dizzy, weak, or pass out (faint). °· You have blood in your pee (urine) or poop (stool). °· You cough up or throw up (vomit) blood. °· Your puffiness or pain is not helped with medicines. °MAKE SURE YOU:  °· Understand these instructions. °· Will watch your condition. °· Will get help right away if you are not doing well or get worse. °  °This information is not intended to replace advice given to you by your health care provider. Make sure you discuss any questions you have with your health care provider. °  °Document Released: 08/17/2007 Document Revised: 11/23/2011 Document Reviewed: 08/22/2011 °Elsevier Interactive Patient Education ©2016 Elsevier Inc. ° °

## 2015-08-02 NOTE — ED Notes (Signed)
Pt states she fell last night against a kitchen cabinet and hit right ribs.

## 2015-08-02 NOTE — ED Provider Notes (Signed)
CSN: [id number]     Arrival date & time 08/02/15  [id number] History  By signing my name below, I, Sonum Patel, attest that this documentation has been prepared under the direction and in the presence of Daleen Bo, MD. Electronically Signed: Sonum Patel, Scribe. 08/02/2015. 10:28 AM.    Chief Complaint  Patient presents with  . Rib Injury   The history is provided by the patient. No language interpreter was used.     HPI Comments: Alexandria Webster is a 50 y.o. female who presents to the Emergency Department complaining of constant, unchanged right rib pain that began 15 hours ago after a fall. Patient states she was walking and fell against the cabinets, striking her right rib area. She reports fracturing her right foot last week and states the boot she is wearing may have contributed to her falling. She has not taken any pain medicine for her pain. She denies head injury or LOC.   PCP Hilma Favors  Past Medical History  Diagnosis Date  . Asthma   . Diabetes mellitus   . Hypertension   . PVD (peripheral vascular disease) ([place])   . DDD (degenerative disc disease), lumbar    Past Surgical History  Procedure Laterality Date  . Femoral artery stent  right leg  . Back surgery    . Coronary angioplasty with stent placement    . Coronary artery bypass graft     Family History  Problem Relation Age of Onset  . Stroke Mother   . Hypertension Mother   . Heart failure Father    Social History  Substance Use Topics  . Smoking status: Current Every Day Smoker -- 0.50 packs/day for 30 years    Types: Cigarettes  . Smokeless tobacco: Never Used  . Alcohol Use: No   OB History    Gravida Para Term Preterm AB TAB SAB Ectopic Multiple Living   2 1  1 1  1   1      Review of Systems  Musculoskeletal: Positive for arthralgias (rib pain).  Skin: Negative for wound.  Neurological: Negative for syncope.      Allergies  Review of patient's allergies indicates no known allergies.  Home  Medications   Prior to Admission medications   Medication Sig Start Date End Date Taking? Authorizing Provider  albuterol (PROVENTIL HFA;VENTOLIN HFA) 108 (90 Base) MCG/ACT inhaler Inhale 1-2 puffs into the lungs every 6 (six) hours as needed for wheezing or shortness of breath. 05/19/15   Evalee Jefferson, PA-C  aspirin EC 81 MG tablet Take 81 mg by mouth daily.    Historical Provider, MD  Carboxymethylcellul-Glycerin (CLEAR EYES FOR DRY EYES OP) Place 2 drops into both eyes 2 (two) times daily.    Historical Provider, MD  GARLIC PO Take 1 tablet by mouth daily.    Historical Provider, MD  HYDROcodone-acetaminophen (NORCO/VICODIN) 5-325 MG tablet Take 1 tablet by mouth every 4 (four) hours as needed. 07/28/15   Hope Bunnie Pion, NP  HYDROcodone-acetaminophen (NORCO/VICODIN) 5-325 MG tablet Take 1 tablet by mouth every 6 (six) hours as needed for moderate pain (Must last 15 days.Do not take and drive a car or use machinery.). 07/30/15   Sanjuana Kava, MD  ibuprofen (ADVIL,MOTRIN) 800 MG tablet Take 1 tablet (800 mg total) by mouth 3 (three) times daily. 07/21/15   Noemi Chapel, MD  insulin detemir (LEVEMIR) 100 UNIT/ML injection Inject 15 Units into the skin at bedtime.    Historical Provider, MD  lisinopril (PRINIVIL,ZESTRIL) 20 MG  tablet Take 20 mg by mouth daily.    Historical Provider, MD  methocarbamol (ROBAXIN) 500 MG tablet Take 1 tablet (500 mg total) by mouth 2 (two) times daily as needed for muscle spasms. 07/21/15   Noemi Chapel, MD  Multiple Vitamin (MULTIVITAMIN WITH MINERALS) TABS tablet Take 1 tablet by mouth daily.    Historical Provider, MD   BP 123/70 mmHg  Pulse 105  Temp(Src) 98.5 F (36.9 C) (Oral)  Resp 18  Ht 5\' 4"  (1.626 m)  Wt 163 lb (73.936 kg)  BMI 27.97 kg/m2  SpO2 99% Physical Exam  Constitutional: She is oriented to person, place, and time. She appears well-developed and well-nourished.  HENT:  Head: Normocephalic and atraumatic.  Eyes: Conjunctivae and EOM are normal.  Pupils are equal, round, and reactive to light.  Neck: Normal range of motion and phonation normal. Neck supple.  Cardiovascular: Normal rate and regular rhythm.   Pulmonary/Chest: Effort normal and breath sounds normal. She exhibits tenderness.  Right anterior lateral chest wall tenderness without crepitation or deformity.   Abdominal: Soft. She exhibits no distension. There is no tenderness. There is no guarding.  Musculoskeletal: Normal range of motion.  No midline spinal tenderness.   Neurological: She is alert and oriented to person, place, and time. She exhibits normal muscle tone.  Skin: Skin is warm and dry.  Psychiatric: She has a normal mood and affect. Her behavior is normal. Judgment and thought content normal.  Nursing note and vitals reviewed.   ED Course  Procedures (including critical care time)  DIAGNOSTIC STUDIES: Oxygen Saturation is 99% on RA, normal by my interpretation.    COORDINATION OF CARE: 10:29 AM Discussed treatment plan with pt at bedside and pt agreed to plan.   Medications - No data to display  Patient Vitals for the past 24 hrs:  BP Temp Temp src Pulse Resp SpO2 Height Weight  08/02/15 1141 123/70 mmHg - - 105 18 99 % - -  08/02/15 0947 118/68 mmHg 98.5 F (36.9 C) Oral 110 18 99 % 5\' 4"  (1.626 m) 163 lb (73.936 kg)     At discharge- Reevaluation with update and discussion. After initial assessment and treatment, an updated evaluation reveals No additional complaints at discharge. Findings discussed with patient, all questions answered. Horacio Werth L    Imaging Review Dg Ribs Unilateral W/chest Right  08/02/2015  CLINICAL DATA:  Pain after fall.  Right lateral rib pain. EXAM: RIGHT RIBS AND CHEST - 3+ VIEW COMPARISON:  Chest x-ray May 19, 2015 FINDINGS: Mild right AC joint degenerative changes seen. No pneumothorax. The heart, hila, and mediastinum are normal. No pulmonary nodules, masses, or focal infiltrates. No rib fractures are identified.  IMPRESSION: No acute abnormalities or rib fractures are identified. Electronically Signed   By: Dorise Bullion III M.D   On: 08/02/2015 10:53   I have personally reviewed and evaluated these images as part of my medical decision-making.   EKG Interpretation None      MDM   Final diagnoses:  Contusion, chest wall, left, initial encounter     Apparent contusion without fracture, or pulmonary contusion. No respiratory distress. Pain controlled in ED.  Nursing Notes Reviewed/ Care Coordinated Applicable Imaging Reviewed Interpretation of Laboratory Data incorporated into ED treatment  The patient appears reasonably screened and/or stabilized for discharge and I doubt any other medical condition or other [facility] requiring further screening, evaluation, or treatment in the ED at this time prior to discharge.  Plan: Home Medications- usual;  Home Treatments- rest; return here if the recommended treatment, does not improve the symptoms; Recommended follow up- PCP prn   I personally performed the services described in this documentation, which was scribed in my presence. The recorded information has been reviewed and is accurate.    Daleen Bo, MD 08/02/15 [phone]

## 2015-08-13 ENCOUNTER — Encounter: Payer: Self-pay | Admitting: Orthopaedic Surgery

## 2015-08-13 ENCOUNTER — Ambulatory Visit (INDEPENDENT_AMBULATORY_CARE_PROVIDER_SITE_OTHER): Payer: BLUE CROSS/BLUE SHIELD

## 2015-08-13 ENCOUNTER — Ambulatory Visit: Payer: BLUE CROSS/BLUE SHIELD | Admitting: Orthopaedic Surgery

## 2015-08-13 VITALS — BP 146/87 | HR 106 | Temp 97.5°F | Ht 64.0 in | Wt 163.0 lb

## 2015-08-13 DIAGNOSIS — S8251XD Displaced fracture of medial malleolus of right tibia, subsequent encounter for closed fracture with routine healing: Secondary | ICD-10-CM

## 2015-08-13 NOTE — Progress Notes (Signed)
CC:  My ankle is just slightly sore  She has much less pain of the right ankle. She is wearing her CAM walker.  NV is intact. ROM is good.  X-rays were taken of the ankle, reported separately.  Encounter Diagnosis  Name Primary?  . Fracture of medial malleolus, right, closed, with routine healing, subsequent encounter Yes    Gradually come out of the CAM walker.  Return in three weeks. X-rays then.  Call if any problem.  Electronically Signed Sanjuana Kava, MD 6/1/20172:10 PM

## 2015-08-23 ENCOUNTER — Emergency Department (HOSPITAL_COMMUNITY)
Admission: EM | Admit: 2015-08-23 | Discharge: 2015-08-23 | Disposition: A | Discharge status: Home / Self Care | Payer: BLUE CROSS/BLUE SHIELD | Attending: Emergency Medicine | Admitting: Emergency Medicine

## 2015-08-23 ENCOUNTER — Encounter (HOSPITAL_COMMUNITY): Payer: Self-pay | Admitting: Emergency Medicine

## 2015-08-23 DIAGNOSIS — E119 Type 2 diabetes mellitus without complications: Secondary | ICD-10-CM | POA: Insufficient documentation

## 2015-08-23 DIAGNOSIS — B9689 Other specified bacterial agents as the cause of diseases classified elsewhere: Secondary | ICD-10-CM

## 2015-08-23 DIAGNOSIS — J45909 Unspecified asthma, uncomplicated: Secondary | ICD-10-CM | POA: Diagnosis not present

## 2015-08-23 DIAGNOSIS — I10 Essential (primary) hypertension: Secondary | ICD-10-CM | POA: Insufficient documentation

## 2015-08-23 DIAGNOSIS — F17210 Nicotine dependence, cigarettes, uncomplicated: Secondary | ICD-10-CM | POA: Diagnosis not present

## 2015-08-23 DIAGNOSIS — Z951 Presence of aortocoronary bypass graft: Secondary | ICD-10-CM | POA: Insufficient documentation

## 2015-08-23 DIAGNOSIS — N898 Other specified noninflammatory disorders of vagina: Secondary | ICD-10-CM | POA: Diagnosis present

## 2015-08-23 DIAGNOSIS — Z7982 Long term (current) use of aspirin: Secondary | ICD-10-CM | POA: Insufficient documentation

## 2015-08-23 DIAGNOSIS — N390 Urinary tract infection, site not specified: Secondary | ICD-10-CM

## 2015-08-23 DIAGNOSIS — Z794 Long term (current) use of insulin: Secondary | ICD-10-CM | POA: Insufficient documentation

## 2015-08-23 DIAGNOSIS — Z8679 Personal history of other diseases of the circulatory system: Secondary | ICD-10-CM | POA: Insufficient documentation

## 2015-08-23 DIAGNOSIS — Z79899 Other long term (current) drug therapy: Secondary | ICD-10-CM | POA: Diagnosis not present

## 2015-08-23 DIAGNOSIS — M5136 Other intervertebral disc degeneration, lumbar region: Secondary | ICD-10-CM | POA: Insufficient documentation

## 2015-08-23 DIAGNOSIS — N760 Acute vaginitis: Secondary | ICD-10-CM | POA: Insufficient documentation

## 2015-08-23 LAB — URINALYSIS, ROUTINE W REFLEX MICROSCOPIC
BILIRUBIN URINE: NEGATIVE
Glucose, UA: >1000 mg/dL — AB
KETONES UR: NEGATIVE mg/dL
NITRITE: NEGATIVE
Protein, ur: NEGATIVE mg/dL
pH: 6 (ref 5.0–8.0)

## 2015-08-23 LAB — WET PREP, GENITAL
SPERM: NONE SEEN
TRICH WET PREP: NONE SEEN
YEAST WET PREP: NONE SEEN

## 2015-08-23 LAB — URINE MICROSCOPIC-ADD ON

## 2015-08-23 MED ORDER — CEPHALEXIN 500 MG PO CAPS: 500.0000 mg | ORAL_CAPSULE | Freq: Three times a day (TID) | ORAL | Status: DC

## 2015-08-23 MED ORDER — CEPHALEXIN 500 MG PO CAPS
500.0000 mg | ORAL_CAPSULE | Freq: Once | ORAL | Status: AC
  Administered 2015-08-23: 500 mg via ORAL
  Filled 2015-08-23: qty 1

## 2015-08-23 MED ORDER — METRONIDAZOLE 500 MG PO TABS
500.0000 mg | ORAL_TABLET | Freq: Once | ORAL | Status: AC
  Administered 2015-08-23: 500 mg via ORAL
  Filled 2015-08-23: qty 1

## 2015-08-23 MED ORDER — METRONIDAZOLE 500 MG PO TABS: 500.0000 mg | ORAL_TABLET | Freq: Three times a day (TID) | ORAL | Status: DC

## 2015-08-23 NOTE — ED Provider Notes (Signed)
CSN: [id number]     Arrival date & time 08/23/15  1318 History   First MD Initiated Contact with Patient 08/23/15 1351     Chief Complaint  Patient presents with  . Vaginal Discharge     (Consider location/radiation/quality/duration/timing/severity/associated sxs/prior Treatment) Patient is a 50 y.o. female presenting with vaginal bleeding.  Vaginal Bleeding Quality:  Bright red Severity:  Mild Duration:  6 hours Timing:  Constant Chronicity:  New Menstrual history:  Postmenopausal Number of pads used:  1 Possible pregnancy: no   Relieved by:  None tried Worsened by:  Nothing tried Ineffective treatments:  None tried Associated symptoms: vaginal discharge   Associated symptoms: no abdominal pain, no dysuria and no fever     Past Medical History  Diagnosis Date  . Asthma   . Diabetes mellitus   . Hypertension   . PVD (peripheral vascular disease) ([place])   . DDD (degenerative disc disease), lumbar    Past Surgical History  Procedure Laterality Date  . Femoral artery stent  right leg  . Back surgery    . Coronary angioplasty with stent placement    . Coronary artery bypass graft     Family History  Problem Relation Age of Onset  . Stroke Mother   . Hypertension Mother   . Heart failure Father    Social History  Substance Use Topics  . Smoking status: Current Every Day Smoker -- 0.50 packs/day for 30 years    Types: Cigarettes  . Smokeless tobacco: Never Used  . Alcohol Use: No   OB History    Gravida Para Term Preterm AB TAB SAB Ectopic Multiple Living   2 1  1 1  1   1      Review of Systems  Constitutional: Negative for fever.  Eyes: Negative for pain.  Gastrointestinal: Negative for abdominal pain.  Genitourinary: Positive for hematuria, vaginal bleeding and vaginal discharge. Negative for dysuria.  All other systems reviewed and are negative.     Allergies  Review of patient's allergies indicates no known allergies.  Home Medications   Prior  to Admission medications   Medication Sig Start Date End Date Taking? Authorizing Provider  aspirin EC 81 MG tablet Take 81 mg by mouth daily.   Yes Historical Provider, MD  budesonide-formoterol (SYMBICORT) 160-4.5 MCG/ACT inhaler Inhale 2 puffs into the lungs 2 (two) times daily.   Yes Historical Provider, MD  Carboxymethylcellul-Glycerin (CLEAR EYES FOR DRY EYES OP) Place 2 drops into both eyes 2 (two) times daily.   Yes Historical Provider, MD  GARLIC PO Take 1 tablet by mouth daily.   Yes Historical Provider, MD  HYDROcodone-acetaminophen (NORCO/VICODIN) 5-325 MG tablet Take 1 tablet by mouth every 4 (four) hours as needed. 07/28/15  Yes Hope Bunnie Pion, NP  HYDROcodone-acetaminophen (NORCO/VICODIN) 5-325 MG tablet Take 1 tablet by mouth every 6 (six) hours as needed for moderate pain (Must last 15 days.Do not take and drive a car or use machinery.). 07/30/15  Yes Sanjuana Kava, MD  ibuprofen (ADVIL,MOTRIN) 800 MG tablet Take 1 tablet (800 mg total) by mouth 3 (three) times daily. 07/21/15  Yes Noemi Chapel, MD  insulin detemir (LEVEMIR) 100 UNIT/ML injection Inject 15 Units into the skin at bedtime.   Yes Historical Provider, MD  lisinopril (PRINIVIL,ZESTRIL) 20 MG tablet Take 20 mg by mouth daily.   Yes Historical Provider, MD  Multiple Vitamin (MULTIVITAMIN WITH MINERALS) TABS tablet Take 1 tablet by mouth daily.   Yes Historical Provider, MD  cephALEXin (  KEFLEX) 500 MG capsule Take 1 capsule (500 mg total) by mouth 3 (three) times daily. 08/23/15   Merrily Pew, MD  metroNIDAZOLE (FLAGYL) 500 MG tablet Take 1 tablet (500 mg total) by mouth 3 (three) times daily. 08/23/15   Corene Cornea Lasha Echeverria, MD   BP 157/98 mmHg  Pulse 115  Temp(Src) 98.2 F (36.8 C) (Oral)  Resp 18  Ht 5\' 4"  (1.626 m)  Wt 163 lb (73.936 kg)  BMI 27.97 kg/m2  SpO2 98% Physical Exam  Constitutional: She is oriented to person, place, and time. She appears well-developed and well-nourished.  HENT:  Head: Normocephalic and  atraumatic.  Neck: Normal range of motion.  Cardiovascular: Normal rate and regular rhythm.   Pulmonary/Chest: No stridor. No respiratory distress.  Abdominal: She exhibits no distension.  Genitourinary: Cervix exhibits no discharge and no friability.  Mild irritation and bleeding of vaginal walls  Musculoskeletal: She exhibits no edema or tenderness.  Neurological: She is alert and oriented to person, place, and time.  Nursing note and vitals reviewed.   ED Course  Procedures (including critical care time) Labs Review Labs Reviewed  WET PREP, GENITAL - Abnormal; Notable for the following:    Clue Cells Wet Prep HPF POC PRESENT (*)    WBC, Wet Prep HPF POC FEW (*)    All other components within normal limits  URINALYSIS, ROUTINE W REFLEX MICROSCOPIC (NOT AT [facility]) - Abnormal; Notable for the following:    Specific Gravity, Urine <1.005 (*)    Glucose, UA >1000 (*)    Hgb urine dipstick SMALL (*)    Leukocytes, UA TRACE (*)    All other components within normal limits  URINE MICROSCOPIC-ADD ON - Abnormal; Notable for the following:    Squamous Epithelial / LPF 0-5 (*)    Bacteria, UA FEW (*)    All other components within normal limits  GC/CHLAMYDIA PROBE AMP ([place]) NOT AT [facility]    Imaging Review No results found. I have personally reviewed and evaluated these images and lab results as part of my medical decision-making.   EKG Interpretation None      MDM   Final diagnoses:  Bacterial vaginosis  UTI (lower urinary tract infection)    Mild vaginal bleeding likely from irritation of vaginal walls as seen on speculum exam. Will tx for uti and BV. No e/o cervical bleeding, will follow up with gynecologist anyway for further recs.   New Prescriptions: Discharge Medication List as of 08/23/2015  3:08 PM    START taking these medications   Details  cephALEXin (KEFLEX) 500 MG capsule Take 1 capsule (500 mg total) by mouth 3 (three) times daily., Starting 08/23/2015,  Until Discontinued, Print    metroNIDAZOLE (FLAGYL) 500 MG tablet Take 1 tablet (500 mg total) by mouth 3 (three) times daily., Starting 08/23/2015, Until Discontinued, Print         I have personally and contemperaneously reviewed labs and imaging and used in my decision making as above.   A medical screening exam was performed and I feel the patient has had an appropriate workup for their chief complaint at this time and likelihood of emergent condition existing is low and thus workup can continue on an outpatient basis.. Their vital signs are stable. They have been counseled on decision, discharge, follow up and which symptoms necessitate immediate return to the emergency department.  They verbally stated understanding and agreement with plan and discharged in stable condition.       Sara Lee,  MD 08/23/15 1609

## 2015-08-23 NOTE — ED Notes (Signed)
PT c/o vaginal itching, burning with bloody discharge x2 days. PT also states burning with urination.

## 2015-08-24 LAB — GC/CHLAMYDIA PROBE AMP (~~LOC~~) NOT AT ARMC
CHLAMYDIA, DNA PROBE: NEGATIVE
NEISSERIA GONORRHEA: NEGATIVE

## 2015-09-03 ENCOUNTER — Encounter: Payer: Self-pay | Admitting: Orthopaedic Surgery

## 2015-09-03 ENCOUNTER — Ambulatory Visit: Payer: Self-pay | Admitting: Orthopaedic Surgery

## 2015-09-04 ENCOUNTER — Emergency Department (HOSPITAL_COMMUNITY)
Admission: EM | Admit: 2015-09-04 | Discharge: 2015-09-05 | Disposition: A | Discharge status: Home / Self Care | Payer: BLUE CROSS/BLUE SHIELD | Attending: Emergency Medicine | Admitting: Emergency Medicine

## 2015-09-04 ENCOUNTER — Emergency Department (HOSPITAL_COMMUNITY): Payer: BLUE CROSS/BLUE SHIELD

## 2015-09-04 ENCOUNTER — Other Ambulatory Visit: Payer: Self-pay

## 2015-09-04 ENCOUNTER — Encounter (HOSPITAL_COMMUNITY): Payer: Self-pay

## 2015-09-04 DIAGNOSIS — Z7982 Long term (current) use of aspirin: Secondary | ICD-10-CM | POA: Insufficient documentation

## 2015-09-04 DIAGNOSIS — R062 Wheezing: Secondary | ICD-10-CM | POA: Diagnosis not present

## 2015-09-04 DIAGNOSIS — Z79899 Other long term (current) drug therapy: Secondary | ICD-10-CM | POA: Insufficient documentation

## 2015-09-04 DIAGNOSIS — R0602 Shortness of breath: Secondary | ICD-10-CM | POA: Diagnosis not present

## 2015-09-04 DIAGNOSIS — R112 Nausea with vomiting, unspecified: Secondary | ICD-10-CM | POA: Diagnosis not present

## 2015-09-04 DIAGNOSIS — R079 Chest pain, unspecified: Secondary | ICD-10-CM

## 2015-09-04 DIAGNOSIS — F17210 Nicotine dependence, cigarettes, uncomplicated: Secondary | ICD-10-CM | POA: Diagnosis not present

## 2015-09-04 DIAGNOSIS — Z794 Long term (current) use of insulin: Secondary | ICD-10-CM | POA: Diagnosis not present

## 2015-09-04 DIAGNOSIS — E1151 Type 2 diabetes mellitus with diabetic peripheral angiopathy without gangrene: Secondary | ICD-10-CM | POA: Diagnosis not present

## 2015-09-04 DIAGNOSIS — I10 Essential (primary) hypertension: Secondary | ICD-10-CM | POA: Diagnosis not present

## 2015-09-04 LAB — CBC
HEMATOCRIT: 45.8 % (ref 36.0–46.0)
HEMOGLOBIN: 16.3 g/dL — AB (ref 12.0–15.0)
MCH: 33.4 pg (ref 26.0–34.0)
MCHC: 35.6 g/dL (ref 30.0–36.0)
MCV: 93.9 fL (ref 78.0–100.0)
Platelets: 211 10*3/uL (ref 150–400)
RBC: 4.88 MIL/uL (ref 3.87–5.11)
RDW: 12.4 % (ref 11.5–15.5)
WBC: 11.4 10*3/uL — AB (ref 4.0–10.5)

## 2015-09-04 LAB — BASIC METABOLIC PANEL
ANION GAP: 16 — AB (ref 5–15)
BUN: 18 mg/dL (ref 6–20)
CO2: 20 mmol/L — ABNORMAL LOW (ref 22–32)
Calcium: 9.7 mg/dL (ref 8.9–10.3)
Chloride: 97 mmol/L — ABNORMAL LOW (ref 101–111)
Creatinine, Ser: 0.79 mg/dL (ref 0.44–1.00)
GFR calc Af Amer: >60 mL/min (ref >60)
GLUCOSE: 359 mg/dL — AB (ref 65–99)
POTASSIUM: 4.1 mmol/L (ref 3.5–5.1)
SODIUM: 133 mmol/L — AB (ref 135–145)

## 2015-09-04 LAB — I-STAT TROPONIN, ED: TROPONIN I, POC: 0 ng/mL (ref 0.00–0.08)

## 2015-09-04 LAB — D-DIMER, QUANTITATIVE (NOT AT ARMC): D DIMER QUANT: 0.9 ug{FEU}/mL — AB (ref 0.00–0.50)

## 2015-09-04 LAB — TROPONIN I: Troponin I: <0.03 ng/mL (ref <0.031)

## 2015-09-04 MED ORDER — SODIUM CHLORIDE 0.9 % IV SOLN
INTRAVENOUS | Status: DC
  Administered 2015-09-04: 20:00:00 via INTRAVENOUS

## 2015-09-04 MED ORDER — ASPIRIN 81 MG PO CHEW
324.0000 mg | CHEWABLE_TABLET | Freq: Once | ORAL | Status: AC
  Administered 2015-09-04: 324 mg via ORAL
  Filled 2015-09-04: qty 4

## 2015-09-04 MED ORDER — SODIUM CHLORIDE 0.9 % IV BOLUS (SEPSIS)
500.0000 mL | Freq: Once | INTRAVENOUS | Status: AC
  Administered 2015-09-04: 500 mL via INTRAVENOUS

## 2015-09-04 MED ORDER — IOPAMIDOL (ISOVUE-370) INJECTION 76%
100.0000 mL | Freq: Once | INTRAVENOUS | Status: AC | PRN
  Administered 2015-09-04: 100 mL via INTRAVENOUS

## 2015-09-04 MED ORDER — NITROGLYCERIN 0.4 MG SL SUBL
0.4000 mg | SUBLINGUAL_TABLET | SUBLINGUAL | Status: DC | PRN
  Filled 2015-09-04: qty 1

## 2015-09-04 NOTE — Discharge Instructions (Signed)
Continue baby aspirin a day. Return for the development of any new chest pain lasting 15 minutes or longer. Make an appointment to follow-up with your regular doctor. Make an appointment to follow-up with cardiology.

## 2015-09-04 NOTE — ED Provider Notes (Addendum)
CSN: [id number]     Arrival date & time 09/04/15  1903 History   First MD Initiated Contact with Patient 09/04/15 1914     Chief Complaint  Patient presents with  . Chest Pain     (Consider location/radiation/quality/duration/timing/severity/associated sxs/prior Treatment) Patient is a 50 y.o. female presenting with chest pain. The history is provided by the patient.  Chest Pain Associated symptoms: nausea, shortness of breath and vomiting   Associated symptoms: no abdominal pain, no back pain and no fever   Patient with onset of left-sided chest pain at 5 PM. Been constant does not radiate associated with some shortness of breath associated with some nausea and one episode of vomiting. Pain is worse was 9 out of 10. Patient takes a baby aspirin a day. Pain still present upon arrival to the emergency department. Patient has a past history of elevated cholesterol tobacco use and hypertension. Patient has no known history of any coronary artery disease. Patient also felt that the chest pain was made worse by breathing. Patient felt that they could be related to her asthma.  Past Medical History  Diagnosis Date  . Asthma   . Diabetes mellitus   . Hypertension   . PVD (peripheral vascular disease) ([place])   . DDD (degenerative disc disease), lumbar    Past Surgical History  Procedure Laterality Date  . Femoral artery stent  right leg  . Back surgery    . Coronary angioplasty with stent placement    . Coronary artery bypass graft     Family History  Problem Relation Age of Onset  . Stroke Mother   . Hypertension Mother   . Heart failure Father    Social History  Substance Use Topics  . Smoking status: Current Every Day Smoker -- 0.50 packs/day for 30 years    Types: Cigarettes  . Smokeless tobacco: Never Used  . Alcohol Use: No   OB History    Gravida Para Term Preterm AB TAB SAB Ectopic Multiple Living   2 1  1 1  1   1      Review of Systems  Constitutional: Negative for  fever.  HENT: Negative for congestion.   Eyes: Negative for visual disturbance.  Respiratory: Positive for shortness of breath and wheezing.   Cardiovascular: Positive for chest pain. Negative for leg swelling.  Gastrointestinal: Positive for nausea and vomiting. Negative for abdominal pain.  Genitourinary: Negative for dysuria.  Musculoskeletal: Negative for back pain.  Skin: Negative for rash.  Hematological: Does not bruise/bleed easily.  Psychiatric/Behavioral: Negative for confusion.      Allergies  Review of patient's allergies indicates no known allergies.  Home Medications   Prior to Admission medications   Medication Sig Start Date End Date Taking? Authorizing Provider  aspirin EC 81 MG tablet Take 81 mg by mouth daily.   Yes Historical Provider, MD  budesonide-formoterol (SYMBICORT) 160-4.5 MCG/ACT inhaler Inhale 2 puffs into the lungs 2 (two) times daily.   Yes Historical Provider, MD  Carboxymethylcellul-Glycerin (CLEAR EYES FOR DRY EYES OP) Place 2 drops into both eyes 2 (two) times daily.   Yes Historical Provider, MD  GARLIC PO Take 1 tablet by mouth daily.   Yes Historical Provider, MD  insulin detemir (LEVEMIR) 100 UNIT/ML injection Inject 15 Units into the skin at bedtime.   Yes Historical Provider, MD  lisinopril (PRINIVIL,ZESTRIL) 20 MG tablet Take 20 mg by mouth daily.   Yes Historical Provider, MD  Multiple Vitamin (MULTIVITAMIN WITH MINERALS) TABS tablet  Take 1 tablet by mouth daily.   Yes Historical Provider, MD  cephALEXin (KEFLEX) 500 MG capsule Take 1 capsule (500 mg total) by mouth 3 (three) times daily. Patient not taking: Reported on 09/04/2015 08/23/15   Merrily Pew, MD  HYDROcodone-acetaminophen (NORCO/VICODIN) 5-325 MG tablet Take 1 tablet by mouth every 4 (four) hours as needed. Patient not taking: Reported on 09/04/2015 07/28/15   [name], NP  HYDROcodone-acetaminophen (NORCO/VICODIN) 5-325 MG tablet Take 1 tablet by mouth every 6 (six) hours as  needed for moderate pain (Must last 15 days.Do not take and drive a car or use machinery.). Patient not taking: Reported on 09/04/2015 07/30/15   Sanjuana Kava, MD  ibuprofen (ADVIL,MOTRIN) 800 MG tablet Take 1 tablet (800 mg total) by mouth 3 (three) times daily. Patient not taking: Reported on 09/04/2015 07/21/15   Noemi Chapel, MD  metroNIDAZOLE (FLAGYL) 500 MG tablet Take 1 tablet (500 mg total) by mouth 3 (three) times daily. Patient not taking: Reported on 09/04/2015 08/23/15   Merrily Pew, MD   BP 147/77 mmHg  Pulse 107  Temp(Src) 98.6 F (37 C) (Oral)  Resp 22  Ht 5\' 4"  (1.626 m)  Wt 73.936 kg  BMI 27.97 kg/m2  SpO2 98% Physical Exam  Constitutional: She is oriented to person, place, and time. She appears well-developed and well-nourished. No distress.  HENT:  Head: Normocephalic and atraumatic.  Mouth/Throat: Oropharynx is clear and moist.  Eyes: Conjunctivae and EOM are normal. Pupils are equal, round, and reactive to light.  Neck: Normal range of motion. Neck supple.  Cardiovascular: Regular rhythm and normal heart sounds.   Tachycardic  Pulmonary/Chest: Effort normal and breath sounds normal. No respiratory distress. She has no wheezes. She exhibits no tenderness.  Abdominal: Soft. Bowel sounds are normal. There is no tenderness.  Musculoskeletal: Normal range of motion. She exhibits no edema.  Neurological: She is alert and oriented to person, place, and time. No cranial nerve deficit. She exhibits normal muscle tone. Coordination normal.  Skin: Skin is warm. No rash noted.  Nursing note and vitals reviewed.   ED Course  Procedures (including critical care time) Labs Review Labs Reviewed  BASIC METABOLIC PANEL - Abnormal; Notable for the following:    Sodium 133 (*)    Chloride 97 (*)    CO2 20 (*)    Glucose, Bld 359 (*)    Anion gap 16 (*)    All other components within normal limits  CBC - Abnormal; Notable for the following:    WBC 11.4 (*)    Hemoglobin  16.3 (*)    All other components within normal limits  D-DIMER, QUANTITATIVE (NOT AT [facility]) - Abnormal; Notable for the following:    D-Dimer, Quant 0.90 (*)    All other components within normal limits  TROPONIN I  I-STAT TROPOININ, ED    Imaging Review Dg Chest 2 View  09/04/2015  CLINICAL DATA:  Chest pain with sob. htn controlled by meds diabetic patient states she smokes a half cigarette a day EXAM: CHEST  2 VIEW COMPARISON:  None. FINDINGS: Normal mediastinum and cardiac silhouette. Hyperinflated lungs with. Normal pulmonary vasculature. No evidence of effusion, infiltrate, or pneumothorax. No acute bony abnormality. IMPRESSION: Mild lung hyperinflation.  No acute findings Electronically Signed   By: Suzy Bouchard M.D.   On: 09/04/2015 20:11   Ct Angio Chest Pe W Or Wo Contrast  09/04/2015  CLINICAL DATA:  Acute onset of central chest pain and shortness of breath. Initial  encounter. EXAM: CT ANGIOGRAPHY CHEST WITH CONTRAST TECHNIQUE: Multidetector CT imaging of the chest was performed using the standard protocol during bolus administration of intravenous contrast. Multiplanar CT image reconstructions and MIPs were obtained to evaluate the vascular anatomy. CONTRAST:  100 mL of Isovue 370 IV contrast COMPARISON:  Chest radiograph performed earlier today at 7:38 p.m., and CTA of the chest performed 02/11/2011 FINDINGS: There is no evidence of pulmonary embolus. The lungs are clear bilaterally. There is no evidence of significant focal consolidation, pleural effusion or pneumothorax. No masses are identified; no abnormal focal contrast enhancement is seen. Scattered coronary artery calcifications are seen. Trace pericardial fluid remains within normal limits. No mediastinal lymphadenopathy is seen. The great vessels are grossly unremarkable in appearance. No axillary lymphadenopathy is seen. The visualized portions of the thyroid gland are unremarkable in appearance. The visualized portions of the  liver and spleen are unremarkable. The visualized portions of the pancreas, gallbladder, stomach, adrenal glands and kidneys are within normal limits. No acute osseous abnormalities are seen. There is mild chronic loss of height at the superior endplate of T2, new from 2012. Mild vacuum phenomenon is noted at the lower thoracic spine. Review of the MIP images confirms the above findings. IMPRESSION: 1. No evidence of pulmonary embolus. 2. Lungs clear bilaterally. 3. Scattered coronary artery calcifications seen. 4. Mild chronic loss of height at the superior endplate of T2. Electronically Signed   By: Garald Balding M.D.   On: 09/04/2015 22:50   I have personally reviewed and evaluated these images and lab results as part of my medical decision-making.   EKG Interpretation   Date/Time:  Friday September 04 2015 19:11:59 EDT Ventricular Rate:  128 PR Interval:    QRS Duration: 78 QT Interval:  301 QTC Calculation: 440 R Axis:   78 Text Interpretation:  Sinus tachycardia Anterior infarct, old Baseline  wander in lead(s) V6 Confirmed by Atif Chapple  MD, Armen Waring [phone]) on  09/04/2015 7:15:51 PM      MDM   Final diagnoses:  Chest pain, unspecified chest pain type    Patient's chest pain resolved after having aspirin. Never received nitroglycerin. Patient felt that the chest pain was made worse by breathing. CT angiogram negative for pulmonary embolus. CT angios was done because of his elevation in the d-dimer. Patient does have some cardiac risk factors to include smoking elevated cholesterol and hypertension. First troponin was negative. Patient's now been chest pain free for almost 4 hours. Second troponin is pending. EKG was consistent with a sinus tachycardia another reason for concern for pulmonary embolus. However as we stated that was ruled out.    Fredia Sorrow, MD 09/04/15 2349  Addendum: Second troponin was 0. Patient is now been 7 hours since the onset of the chest pain. 2 negative  troponins no further pain now for several hours CT angiogram ruled out pulmonary embolus. Patient can be discharged home with precautions. She'll continue baby aspirin a day she'll follow-up with cardiology as well as her regular doctor. Patient will return for any recurrent chest pain lasting 15 minutes or longer.  Fredia Sorrow, MD 09/04/15 [phone]

## 2015-09-04 NOTE — ED Notes (Signed)
Chest pain started two hours prior to arrival. Patient states central chest pain with shortness of breath.

## 2015-09-05 ENCOUNTER — Emergency Department (HOSPITAL_COMMUNITY)
Admission: EM | Admit: 2015-09-05 | Discharge: 2015-09-05 | Disposition: A | Discharge status: Home / Self Care | Payer: BLUE CROSS/BLUE SHIELD | Source: Home / Self Care | Attending: Emergency Medicine | Admitting: Emergency Medicine

## 2015-09-05 ENCOUNTER — Encounter (HOSPITAL_COMMUNITY): Payer: Self-pay | Admitting: *Deleted

## 2015-09-05 ENCOUNTER — Encounter (HOSPITAL_COMMUNITY): Payer: Self-pay

## 2015-09-05 ENCOUNTER — Other Ambulatory Visit: Payer: Self-pay

## 2015-09-05 ENCOUNTER — Emergency Department (EMERGENCY_DEPARTMENT_HOSPITAL)
Admission: EM | Admit: 2015-09-05 | Discharge: 2015-09-05 | Disposition: A | Discharge status: Home / Self Care | Payer: BLUE CROSS/BLUE SHIELD | Source: Home / Self Care | Attending: Emergency Medicine | Admitting: Emergency Medicine

## 2015-09-05 ENCOUNTER — Emergency Department (HOSPITAL_COMMUNITY): Payer: BLUE CROSS/BLUE SHIELD

## 2015-09-05 DIAGNOSIS — Z794 Long term (current) use of insulin: Secondary | ICD-10-CM | POA: Insufficient documentation

## 2015-09-05 DIAGNOSIS — Z7982 Long term (current) use of aspirin: Secondary | ICD-10-CM

## 2015-09-05 DIAGNOSIS — J45909 Unspecified asthma, uncomplicated: Secondary | ICD-10-CM

## 2015-09-05 DIAGNOSIS — F17210 Nicotine dependence, cigarettes, uncomplicated: Secondary | ICD-10-CM

## 2015-09-05 DIAGNOSIS — Z79899 Other long term (current) drug therapy: Secondary | ICD-10-CM

## 2015-09-05 DIAGNOSIS — I10 Essential (primary) hypertension: Secondary | ICD-10-CM

## 2015-09-05 DIAGNOSIS — R079 Chest pain, unspecified: Secondary | ICD-10-CM | POA: Diagnosis not present

## 2015-09-05 DIAGNOSIS — R739 Hyperglycemia, unspecified: Secondary | ICD-10-CM

## 2015-09-05 DIAGNOSIS — E119 Type 2 diabetes mellitus without complications: Secondary | ICD-10-CM

## 2015-09-05 DIAGNOSIS — E1151 Type 2 diabetes mellitus with diabetic peripheral angiopathy without gangrene: Secondary | ICD-10-CM

## 2015-09-05 DIAGNOSIS — I739 Peripheral vascular disease, unspecified: Secondary | ICD-10-CM | POA: Insufficient documentation

## 2015-09-05 DIAGNOSIS — Z955 Presence of coronary angioplasty implant and graft: Secondary | ICD-10-CM

## 2015-09-05 DIAGNOSIS — E1165 Type 2 diabetes mellitus with hyperglycemia: Secondary | ICD-10-CM

## 2015-09-05 LAB — URINALYSIS, ROUTINE W REFLEX MICROSCOPIC
Bilirubin Urine: NEGATIVE
Glucose, UA: >1000 mg/dL — AB
Hgb urine dipstick: NEGATIVE
Ketones, ur: 40 mg/dL — AB
Leukocytes, UA: NEGATIVE
Nitrite: NEGATIVE
Protein, ur: NEGATIVE mg/dL
Specific Gravity, Urine: 1.041 — ABNORMAL HIGH (ref 1.005–1.030)
pH: 5 (ref 5.0–8.0)

## 2015-09-05 LAB — BASIC METABOLIC PANEL
Anion gap: 11 (ref 5–15)
BUN: 15 mg/dL (ref 6–20)
CO2: 18 mmol/L — ABNORMAL LOW (ref 22–32)
Calcium: 9.3 mg/dL (ref 8.9–10.3)
Chloride: 102 mmol/L (ref 101–111)
Creatinine, Ser: 0.94 mg/dL (ref 0.44–1.00)
GFR calc Af Amer: >60 mL/min (ref >60)
GFR calc non Af Amer: >60 mL/min (ref >60)
Glucose, Bld: 563 mg/dL (ref 65–99)
Potassium: 4.4 mmol/L (ref 3.5–5.1)
Sodium: 131 mmol/L — ABNORMAL LOW (ref 135–145)

## 2015-09-05 LAB — CBC
HCT: 41.1 % (ref 36.0–46.0)
Hemoglobin: 14.2 g/dL (ref 12.0–15.0)
MCH: 32.1 pg (ref 26.0–34.0)
MCHC: 34.5 g/dL (ref 30.0–36.0)
MCV: 93 fL (ref 78.0–100.0)
Platelets: 197 10*3/uL (ref 150–400)
RBC: 4.42 MIL/uL (ref 3.87–5.11)
RDW: 12.6 % (ref 11.5–15.5)
WBC: 7.6 10*3/uL (ref 4.0–10.5)

## 2015-09-05 LAB — I-STAT TROPONIN, ED: Troponin i, poc: 0 ng/mL (ref 0.00–0.08)

## 2015-09-05 LAB — URINE MICROSCOPIC-ADD ON
RBC / HPF: NONE SEEN RBC/hpf (ref 0–5)
WBC, UA: NONE SEEN WBC/hpf (ref 0–5)

## 2015-09-05 LAB — TROPONIN I
Troponin I: <0.03 ng/mL (ref <0.031)
Troponin I: <0.03 ng/mL (ref <0.031)

## 2015-09-05 LAB — CBG MONITORING, ED: Glucose-Capillary: 173 mg/dL — ABNORMAL HIGH (ref 65–99)

## 2015-09-05 MED ORDER — ALBUTEROL SULFATE (2.5 MG/3ML) 0.083% IN NEBU
5.0000 mg | INHALATION_SOLUTION | Freq: Once | RESPIRATORY_TRACT | Status: AC
  Administered 2015-09-05: 5 mg via RESPIRATORY_TRACT
  Filled 2015-09-05: qty 6

## 2015-09-05 MED ORDER — KETOROLAC TROMETHAMINE 15 MG/ML IJ SOLN
15.0000 mg | Freq: Once | INTRAMUSCULAR | Status: AC
Start: 2015-09-05 — End: 2015-09-05
  Administered 2015-09-05: 15 mg via INTRAVENOUS
  Filled 2015-09-05: qty 1

## 2015-09-05 MED ORDER — IOPAMIDOL (ISOVUE-370) INJECTION 76%
INTRAVENOUS | Status: AC
  Filled 2015-09-05: qty 100

## 2015-09-05 MED ORDER — INSULIN ASPART 100 UNIT/ML ~~LOC~~ SOLN
12.0000 [IU] | Freq: Once | SUBCUTANEOUS | Status: AC
  Administered 2015-09-05: 12 [IU] via INTRAVENOUS
  Filled 2015-09-05: qty 1

## 2015-09-05 MED ORDER — MORPHINE SULFATE (PF) 4 MG/ML IV SOLN
4.0000 mg | Freq: Once | INTRAVENOUS | Status: AC
  Administered 2015-09-05: 4 mg via INTRAVENOUS
  Filled 2015-09-05: qty 1

## 2015-09-05 MED ORDER — DIAZEPAM 5 MG/ML IJ SOLN
2.5000 mg | Freq: Once | INTRAMUSCULAR | Status: AC
  Administered 2015-09-05: 2.5 mg via INTRAVENOUS
  Filled 2015-09-05: qty 2

## 2015-09-05 MED ORDER — IPRATROPIUM BROMIDE 0.02 % IN SOLN
0.5000 mg | Freq: Once | RESPIRATORY_TRACT | Status: AC
  Administered 2015-09-05: 0.5 mg via RESPIRATORY_TRACT
  Filled 2015-09-05: qty 2.5

## 2015-09-05 MED ORDER — SODIUM CHLORIDE 0.9 % IV BOLUS (SEPSIS)
2000.0000 mL | Freq: Once | INTRAVENOUS | Status: AC
  Administered 2015-09-05: 2000 mL via INTRAVENOUS

## 2015-09-05 MED ORDER — KETOROLAC TROMETHAMINE 30 MG/ML IJ SOLN
30.0000 mg | Freq: Once | INTRAMUSCULAR | Status: AC
  Administered 2015-09-05: 30 mg via INTRAMUSCULAR
  Filled 2015-09-05: qty 1

## 2015-09-05 MED ORDER — KETOROLAC TROMETHAMINE 30 MG/ML IJ SOLN: 30.0000 mg | Freq: Once | INTRAMUSCULAR | Status: DC

## 2015-09-05 MED ORDER — ONDANSETRON HCL 4 MG/2ML IJ SOLN
4.0000 mg | Freq: Once | INTRAMUSCULAR | Status: AC
  Administered 2015-09-05: 4 mg via INTRAVENOUS
  Filled 2015-09-05: qty 2

## 2015-09-05 NOTE — ED Notes (Signed)
2 attempts for IV access-unsuccessful

## 2015-09-05 NOTE — ED Provider Notes (Signed)
Patient seen/examined in the Emergency Department in conjunction with Midlevel Provider [place] Patient reports left sided sharp CP for several hours.  She denies h/o CAD/stent placement (epic notes reports h/o CAD but pt denies) Exam : awake/alert, no distress noted Plan: pt with continued CP with multiple risk factors Requested hospitalist consultation for admission   Ripley Fraise, MD 09/05/15 1051

## 2015-09-05 NOTE — ED Notes (Signed)
Patient here with 1 day of chest pain that she describes as sharp. Seen at [facility] ED yesterday and had tests and request further evaluation

## 2015-09-05 NOTE — ED Notes (Signed)
MD at bedside. 

## 2015-09-05 NOTE — ED Notes (Signed)
Pt was seen here yesterday morning for CP and sent home. She was told to return is symptoms continued. Pt returned last night because CP was worse then was accidentally discharged by staff here without completion of doctors evaluation.

## 2015-09-05 NOTE — ED Notes (Signed)
Pt was seen here earlier for chest pain, discharged home.  Pt states when she got home the sharp stabbing pain hit her again in the left chest, lasted approx 20 mins.  Pt states she was told if the pain came back she would need to be transferred to [facility].

## 2015-09-05 NOTE — ED Notes (Signed)
Patient would like something to drink at this time. RN notified.

## 2015-09-05 NOTE — Consult Note (Signed)
Requesting physician: Marcene Brawn, PA  Primary Care Physician: Purvis Kilts, MD  Reason for consultation: Chest pain, potential admission   History of Present Illness: Patient is a 50 year old woman with multiple medical comorbidities including hypertension, diabetes, hyperlipidemia, asthma who presented to the hospital for evaluation of chest pain. She states that chest pain is over her left breast nonradiating, does not describe shortness of breath, palpitations, diaphoresis lightheadedness or dizziness. Pain has no relationship to exertion or food ingestion. She actually presented to the ED yesterday for the same symptoms had 2 negative troponins and EKG without ischemic abnormalities and was discharged home. She subsequently returned today. 2 troponins are again negative, EKG without acute ischemic abnormalities. Consultation has been requested for potential admission.   Allergies:  No Known Allergies    Past Medical History  Diagnosis Date  . Asthma   . Diabetes mellitus   . Hypertension   . PVD (peripheral vascular disease) ([place])   . DDD (degenerative disc disease), lumbar     Past Surgical History  Procedure Laterality Date  . Femoral artery stent  right leg  . Back surgery    . Coronary angioplasty with stent placement    . Coronary artery bypass graft      Scheduled Meds: Continuous Infusions: PRN Meds:.  Social History:  reports that she has been smoking Cigarettes.  She has a 15 pack-year smoking history. She has never used smokeless tobacco. She reports that she does not drink alcohol or use illicit drugs.  Family History  Problem Relation Age of Onset  . Stroke Mother   . Hypertension Mother   . Heart failure Father     Review of Systems:  Constitutional: Denies fever, chills, diaphoresis, appetite change and fatigue.  HEENT: Denies photophobia, eye pain, redness, hearing loss, ear pain, congestion, sore throat, rhinorrhea, sneezing, mouth  sores, trouble swallowing, neck pain, neck stiffness and tinnitus.   Respiratory: Denies SOB, DOE, cough,,  and wheezing.   Cardiovascular: Deniespalpitations and leg swelling.  Gastrointestinal: Denies nausea, vomiting, abdominal pain, diarrhea, constipation, blood in stool and abdominal distention.  Genitourinary: Denies dysuria, urgency, frequency, hematuria, flank pain and difficulty urinating.  Endocrine: Denies: hot or cold intolerance, sweats, changes in hair or nails, polyuria, polydipsia. Musculoskeletal: Denies myalgias, back pain, joint swelling, arthralgias and gait problem.  Skin: Denies pallor, rash and wound.  Neurological: Denies dizziness, seizures, syncope, weakness, light-headedness, numbness and headaches.  Hematological: Denies adenopathy. Easy bruising, personal or family bleeding history  Psychiatric/Behavioral: Denies suicidal ideation, mood changes, confusion, nervousness, sleep disturbance and agitation   Physical Exam: Blood pressure 145/70, pulse 108, temperature 98 F (36.7 C), temperature source Oral, resp. rate 22, SpO2 98 %. General: Alert, awake, oriented 3, no distress HEENT: Normocephalic, atraumatic, pupils equal round and reactive to light, extra movements intact Neck: Supple, no JVD, no lymphadenopathy, no bruits, no goiter Cardiovascular: Regular rate and rhythm, no murmurs, rubs or gallops Lungs: Clear to auscultation bilaterally Abdomen: Soft, nontender, nondistended, positive bowel sounds, no masses or organomegaly noted Extremities: No clubbing, cyanosis or edema, positive pulses Neurologic: Grossly intact and nonfocal  Labs on Admission:  Results for orders placed or performed during the hospital encounter of 09/05/15 (from the past 48 hour(s))  Basic metabolic panel     Status: Abnormal   Collection Time: 09/05/15  4:24 PM  Result Value Ref Range   Sodium 131 (L) 135 - 145 mmol/L   Potassium 4.4 3.5 - 5.1 mmol/L   Chloride 102  101 - 111  mmol/L   CO2 18 (L) 22 - 32 mmol/L   Glucose, Bld 563 (HH) 65 - 99 mg/dL    Comment: CRITICAL RESULT CALLED TO, READ BACK BY AND VERIFIED WITH: RN EASLEY,J AT [id number] MARTINB    BUN 15 6 - 20 mg/dL   Creatinine, Ser 0.94 0.44 - 1.00 mg/dL   Calcium 9.3 8.9 - 10.3 mg/dL   GFR calc non Af Amer >60 >60 mL/min   GFR calc Af Amer >60 >60 mL/min    Comment: (NOTE) The eGFR has been calculated using the CKD EPI equation. This calculation has not been validated in all clinical situations. eGFR's persistently <60 mL/min signify possible Chronic Kidney Disease.    Anion gap 11 5 - 15  CBC     Status: None   Collection Time: 09/05/15  4:24 PM  Result Value Ref Range   WBC 7.6 4.0 - 10.5 K/uL   RBC 4.42 3.87 - 5.11 MIL/uL   Hemoglobin 14.2 12.0 - 15.0 g/dL   HCT 41.1 36.0 - 46.0 %   MCV 93.0 78.0 - 100.0 fL   MCH 32.1 26.0 - 34.0 pg   MCHC 34.5 30.0 - 36.0 g/dL   RDW 12.6 11.5 - 15.5 %   Platelets 197 150 - 400 K/uL  I-stat troponin, ED     Status: None   Collection Time: 09/05/15  4:35 PM  Result Value Ref Range   Troponin i, poc 0.00 0.00 - 0.08 ng/mL   Comment 3            Comment: Due to the release kinetics of cTnI, a negative result within the first hours of the onset of symptoms does not rule out myocardial infarction with certainty. If myocardial infarction is still suspected, repeat the test at appropriate intervals.     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 Atherosclerotic calcification aorta. Lungs clear. No pleural effusion or pneumothorax. Bones unremarkable. IMPRESSION: No acute abnormalities. Aortic  atherosclerosis. Electronically Signed   By: Lavonia Dana M.D.   On: 09/05/2015 16:53   Dg Chest 2 View  09/04/2015  CLINICAL DATA:  Chest pain with sob. htn controlled by meds diabetic patient states she smokes a half cigarette a day EXAM: CHEST  2 VIEW COMPARISON:  None. FINDINGS: Normal mediastinum and cardiac silhouette. Hyperinflated lungs with. Normal pulmonary vasculature. No evidence of effusion, infiltrate, or pneumothorax. No acute bony abnormality. IMPRESSION: Mild lung hyperinflation.  No acute findings Electronically Signed   By: Suzy Bouchard M.D.   On: 09/04/2015 20:11   Ct Angio Chest Pe W Or Wo Contrast  09/04/2015  CLINICAL DATA:  Acute onset of central chest pain and shortness of breath. Initial encounter. EXAM: CT ANGIOGRAPHY CHEST WITH CONTRAST TECHNIQUE: Multidetector CT imaging of the chest was performed using the standard protocol during bolus administration of intravenous contrast. Multiplanar CT image reconstructions and MIPs were obtained to evaluate the vascular anatomy. CONTRAST:  100 mL of Isovue 370 IV contrast COMPARISON:  Chest radiograph performed earlier today at 7:38 p.m., and CTA of the chest performed 02/11/2011 FINDINGS: There is no evidence of pulmonary embolus. The lungs are clear bilaterally. There is no evidence  of significant focal consolidation, pleural effusion or pneumothorax. No masses are identified; no abnormal focal contrast enhancement is seen. Scattered coronary artery calcifications are seen. Trace pericardial fluid remains within normal limits. No mediastinal lymphadenopathy is seen. The great vessels are grossly unremarkable in appearance. No axillary lymphadenopathy is seen. The visualized portions of the thyroid gland are unremarkable in appearance. The visualized portions of the liver and spleen are unremarkable. The visualized portions of the pancreas, gallbladder, stomach, adrenal glands and kidneys are within normal limits. No acute osseous  abnormalities are seen. There is mild chronic loss of height at the superior endplate of T2, new from 2012. Mild vacuum phenomenon is noted at the lower thoracic spine. Review of the MIP images confirms the above findings. IMPRESSION: 1. No evidence of pulmonary embolus. 2. Lungs clear bilaterally. 3. Scattered coronary artery calcifications seen. 4. Mild chronic loss of height at the superior endplate of T2. Electronically Signed   By: Garald Balding M.D.   On: 09/04/2015 22:50    Assessment/Plan  Chest pain -This is atypical in nature. -Heart score is 3 on account of her age and coronary artery disease risk factors. -After discussion with patient I believe she is quite low risk, in addition given her 2 visits within a 24-hour period she has essentially ruled out for ACS by the way of 4 negative troponins and EKGs that have consistently not shown any ischemic abnormalities. -CT angiogram is negative for PE. -Believe it is safe to discharge her home with outpatient follow-up with cardiology for consideration of stress testing given her risk factors.  Time Spent on Consultation: 65 minutes  HERNANDEZ ACOSTA,ESTELA [facility]  [phone] 09/05/2015, 5:23 PM

## 2015-09-05 NOTE — Discharge Instructions (Signed)

## 2015-09-05 NOTE — ED Notes (Signed)
Pt alert & oriented x4, stable gait. Patient given discharge instructions, paperwork & prescription(s).  Patient verbalized understanding. Pt left department in wheelchair. Pt left  w/ no further questions. 

## 2015-09-05 NOTE — Discharge Instructions (Signed)
Hyperglycemia °Hyperglycemia occurs when the glucose (sugar) in your blood is too high. Hyperglycemia can happen for many reasons, but it most often happens to people who do not know they have diabetes or are not managing their diabetes properly.  °CAUSES  °Whether you have diabetes or not, there are other causes of hyperglycemia. Hyperglycemia can occur when you have diabetes, but it can also occur in other situations that you might not be as aware of, such as: °Diabetes °· If you have diabetes and are having problems controlling your blood glucose, hyperglycemia could occur because of some of the following reasons: °¨ Not following your meal plan. °¨ Not taking your diabetes medications or not taking it properly. °¨ Exercising less or doing less activity than you normally do. °¨ Being sick. °Pre-diabetes °· This cannot be ignored. Before people develop Type 2 diabetes, they almost always have "pre-diabetes." This is when your blood glucose levels are higher than normal, but not yet high enough to be diagnosed as diabetes. Research has shown that some long-term damage to the body, especially the heart and circulatory system, may already be occurring during pre-diabetes. If you take action to manage your blood glucose when you have pre-diabetes, you may delay or prevent Type 2 diabetes from developing. °Stress °· If you have diabetes, you may be "diet" controlled or on oral medications or insulin to control your diabetes. However, you may find that your blood glucose is higher than usual in the hospital whether you have diabetes or not. This is often referred to as "stress hyperglycemia." Stress can elevate your blood glucose. This happens because of hormones put out by the body during times of stress. If stress has been the cause of your high blood glucose, it can be followed regularly by your caregiver. That way he/she can make sure your hyperglycemia does not continue to get worse or progress to  diabetes. °Steroids °· Steroids are medications that act on the infection fighting system (immune system) to block inflammation or infection. One side effect can be a rise in blood glucose. Most people can produce enough extra insulin to allow for this rise, but for those who cannot, steroids make blood glucose levels go even higher. It is not unusual for steroid treatments to "uncover" diabetes that is developing. It is not always possible to determine if the hyperglycemia will go away after the steroids are stopped. A special blood test called an A1c is sometimes done to determine if your blood glucose was elevated before the steroids were started. °SYMPTOMS °· Thirsty. °· Frequent urination. °· Dry mouth. °· Blurred vision. °· Tired or fatigue. °· Weakness. °· Sleepy. °· Tingling in feet or leg. °DIAGNOSIS  °Diagnosis is made by monitoring blood glucose in one or all of the following ways: °· A1c test. This is a chemical found in your blood. °· Fingerstick blood glucose monitoring. °· Laboratory results. °TREATMENT  °First, knowing the cause of the hyperglycemia is important before the hyperglycemia can be treated. Treatment may include, but is not be limited to: °· Education. °· Change or adjustment in medications. °· Change or adjustment in meal plan. °· Treatment for an illness, infection, etc. °· More frequent blood glucose monitoring. °· Change in exercise plan. °· Decreasing or stopping steroids. °· Lifestyle changes. °HOME CARE INSTRUCTIONS  °· Test your blood glucose as directed. °· Exercise regularly. Your caregiver will give you instructions about exercise. Pre-diabetes or diabetes which comes on with stress is helped by exercising. °· Eat wholesome,   balanced meals. Eat often and at regular, fixed times. Your caregiver or nutritionist will give you a meal plan to guide your sugar intake. °· Being at an ideal weight is important. If needed, losing as little as 10 to 15 pounds may help improve blood  glucose levels. °SEEK MEDICAL CARE IF:  °· You have questions about medicine, activity, or diet. °· You continue to have symptoms (problems such as increased thirst, urination, or weight gain). °SEEK IMMEDIATE MEDICAL CARE IF:  °· You are vomiting or have diarrhea. °· Your breath smells fruity. °· You are breathing faster or slower. °· You are very sleepy or incoherent. °· You have numbness, tingling, or pain in your feet or hands. °· You have chest pain. °· Your symptoms get worse even though you have been following your caregiver's orders. °· If you have any other questions or concerns. °  °This information is not intended to replace advice given to you by your health care provider. Make sure you discuss any questions you have with your health care provider. °  °Document Released: 08/24/2000 Document Revised: 05/23/2011 Document Reviewed: 11/04/2014 °Elsevier Interactive Patient Education ©2016 [employer] ° °Nonspecific Chest Pain  °Chest pain can be caused by many different conditions. There is always a chance that your pain could be related to something serious, such as a heart attack or a blood clot in your lungs. Chest pain can also be caused by conditions that are not life-threatening. If you have chest pain, it is very important to follow up with your health care provider. °CAUSES  °Chest pain can be caused by: °· Heartburn. °· Pneumonia or bronchitis. °· Anxiety or stress. °· Inflammation around your heart (pericarditis) or lung (pleuritis or pleurisy). °· A blood clot in your lung. °· A collapsed lung (pneumothorax). It can develop suddenly on its own (spontaneous pneumothorax) or from trauma to the chest. °· Shingles infection (varicella-zoster virus). °· Heart attack. °· Damage to the bones, muscles, and cartilage that make up your chest wall. This can include: °¨ Bruised bones due to injury. °¨ Strained muscles or cartilage due to frequent or repeated coughing or overwork. °¨ Fracture to one or more  ribs. °¨ Sore cartilage due to inflammation (costochondritis). °RISK FACTORS  °Risk factors for chest pain may include: °· Activities that increase your risk for trauma or injury to your chest. °· Respiratory infections or conditions that cause frequent coughing. °· Medical conditions or overeating that can cause heartburn. °· Heart disease or family history of heart disease. °· Conditions or health behaviors that increase your risk of developing a blood clot. °· Having had chicken pox (varicella zoster). °SIGNS AND SYMPTOMS °Chest pain can feel like: °· Burning or tingling on the surface of your chest or deep in your chest. °· Crushing, pressure, aching, or squeezing pain. °· Dull or sharp pain that is worse when you move, cough, or take a deep breath. °· Pain that is also felt in your back, neck, shoulder, or arm, or pain that spreads to any of these areas. °Your chest pain may come and go, or it may stay constant. °DIAGNOSIS °Lab tests or other studies may be needed to find the cause of your pain. Your health care provider may have you take a test called an ambulatory ECG (electrocardiogram). An ECG records your heartbeat patterns at the time the test is performed. You may also have other tests, such as: °· Transthoracic echocardiogram (TTE). During echocardiography, sound waves are used to create a   picture of all of the heart structures and to look at how blood flows through your heart. °· Transesophageal echocardiogram (TEE). This is a more advanced imaging test that obtains images from inside your body. It allows your health care provider to see your heart in finer detail. °· Cardiac monitoring. This allows your health care provider to monitor your heart rate and rhythm in real time. °· Holter monitor. This is a portable device that records your heartbeat and can help to diagnose abnormal heartbeats. It allows your health care provider to track your heart activity for several days, if needed. °· Stress tests.  These can be done through exercise or by taking medicine that makes your heart beat more quickly. °· Blood tests. °· Imaging tests. °TREATMENT  °Your treatment depends on what is causing your chest pain. Treatment may include: °· Medicines. These may include: °¨ Acid blockers for heartburn. °¨ Anti-inflammatory medicine. °¨ Pain medicine for inflammatory conditions. °¨ Antibiotic medicine, if an infection is present. °¨ Medicines to dissolve blood clots. °¨ Medicines to treat coronary artery disease. °· Supportive care for conditions that do not require medicines. This may include: °¨ Resting. °¨ Applying heat or cold packs to injured areas. °¨ Limiting activities until pain decreases. °HOME CARE INSTRUCTIONS °· If you were prescribed an antibiotic medicine, finish it all even if you start to feel better. °· Avoid any activities that bring on chest pain. °· Do not use any tobacco products, including cigarettes, chewing tobacco, or electronic cigarettes. If you need help quitting, ask your health care provider. °· Do not drink alcohol. °· Take medicines only as directed by your health care provider. °· Keep all follow-up visits as directed by your health care provider. This is important. This includes any further testing if your chest pain does not go away. °· If heartburn is the cause for your chest pain, you may be told to keep your head raised (elevated) while sleeping. This reduces the chance that acid will go from your stomach into your esophagus. °· Make lifestyle changes as directed by your health care provider. These may include: °¨ Getting regular exercise. Ask your health care provider to suggest some activities that are safe for you. °¨ Eating a heart-healthy diet. A registered dietitian can help you to learn healthy eating options. °¨ Maintaining a healthy weight. °¨ Managing diabetes, if necessary. °¨ Reducing stress. °SEEK MEDICAL CARE IF: °· Your chest pain does not go away after treatment. °· You have  a rash with blisters on your chest. °· You have a fever. °SEEK IMMEDIATE MEDICAL CARE IF:  °· Your chest pain is worse. °· You have an increasing cough, or you cough up blood. °· You have severe abdominal pain. °· You have severe weakness. °· You faint. °· You have chills. °· You have sudden, unexplained chest discomfort. °· You have sudden, unexplained discomfort in your arms, back, neck, or jaw. °· You have shortness of breath at any time. °· You suddenly start to sweat, or your skin gets clammy. °· You feel nauseous or you vomit. °· You suddenly feel light-headed or dizzy. °· Your heart begins to beat quickly, or it feels like it is skipping beats. °These symptoms may represent a serious problem that is an emergency. Do not wait to see if the symptoms will go away. Get medical help right away. Call your local emergency services (911 in the [place] Do not drive yourself to the hospital. °  °This information is not intended to replace   advice given to you by your health care provider. Make sure you discuss any questions you have with your health care provider. °  °Document Released: 12/08/2004 Document Revised: 03/21/2014 Document Reviewed: 10/04/2013 °Elsevier Interactive Patient Education ©2016 [employer] ° °

## 2015-09-05 NOTE — ED Notes (Signed)
Notified pt she was discharged by mistake.  Instructed pt to return and to bring her d/c paperwork with her that she received.  Pt says she will get her boyfriend to bring her back to the ER.

## 2015-09-05 NOTE — ED Provider Notes (Signed)
History  By signing my name below, I, [name], attest that this documentation has been prepared under the direction and in the presence of [name], [place]. Electronically Signed: [name], ED Scribe. 09/05/2015. 10:43 AM.  Chief Complaint  Patient presents with  . Chest Pain   The history is provided by the patient and medical records. No language interpreter was used.    HPI Comments:  Alexandria Webster is a 50 y.o. female who presents to the Emergency Department complaining of sharp chest pain that began yesterday. Pt states she was seen here yesterday and was instructed to come back for continued symptoms. She states that once she got home yesterday she had a sharp pain in her chest that lasted approximately 20 minutes. She has not done anything to treat her symptoms. She denies modifying factors. She denies nausea, vomiting. PMHx of DM, HTN, CABG and stent placement.  Past Medical History  Diagnosis Date  . Asthma   . Diabetes mellitus   . Hypertension   . PVD (peripheral vascular disease) ([place])   . DDD (degenerative disc disease), lumbar    Past Surgical History  Procedure Laterality Date  . Femoral artery stent  right leg  . Back surgery    . Coronary angioplasty with stent placement    . Coronary artery bypass graft     Family History  Problem Relation Age of Onset  . Stroke Mother   . Hypertension Mother   . Heart failure Father    Social History  Substance Use Topics  . Smoking status: Current Every Day Smoker -- 0.50 packs/day for 30 years    Types: Cigarettes  . Smokeless tobacco: Never Used  . Alcohol Use: No   OB History    Gravida Para Term Preterm AB TAB SAB Ectopic Multiple Living   2 1  1 1  1   1      Review of Systems  Cardiovascular: Positive for chest pain.  All other systems reviewed and are negative.   Allergies  Review of patient's allergies indicates no known allergies.  Home Medications   Prior to Admission  medications   Medication Sig Start Date End Date Taking? Authorizing Provider  aspirin EC 81 MG tablet Take 81 mg by mouth daily.   Yes Historical Provider, MD  budesonide-formoterol (SYMBICORT) 160-4.5 MCG/ACT inhaler Inhale 2 puffs into the lungs 2 (two) times daily.   Yes Historical Provider, MD  Carboxymethylcellul-Glycerin (CLEAR EYES FOR DRY EYES OP) Place 2 drops into both eyes 2 (two) times daily.   Yes Historical Provider, MD  esomeprazole (NEXIUM) 20 MG capsule Take 20 mg by mouth daily at 12 noon.   Yes Historical Provider, MD  GARLIC PO Take 1 tablet by mouth daily.   Yes Historical Provider, MD  insulin detemir (LEVEMIR) 100 UNIT/ML injection Inject 15 Units into the skin at bedtime.   Yes Historical Provider, MD  lisinopril (PRINIVIL,ZESTRIL) 20 MG tablet Take 20 mg by mouth daily.   Yes Historical Provider, MD  Multiple Vitamin (MULTIVITAMIN WITH MINERALS) TABS tablet Take 1 tablet by mouth daily.   Yes Historical Provider, MD  cephALEXin (KEFLEX) 500 MG capsule Take 1 capsule (500 mg total) by mouth 3 (three) times daily. Patient not taking: Reported on 09/04/2015 08/23/15   Merrily Pew, MD  HYDROcodone-acetaminophen (NORCO/VICODIN) 5-325 MG tablet Take 1 tablet by mouth every 4 (four) hours as needed. Patient not taking: Reported on 09/04/2015 07/28/15   [name], NP  HYDROcodone-acetaminophen (NORCO/VICODIN)  5-325 MG tablet Take 1 tablet by mouth every 6 (six) hours as needed for moderate pain (Must last 15 days.Do not take and drive a car or use machinery.). Patient not taking: Reported on 09/04/2015 07/30/15   Sanjuana Kava, MD  ibuprofen (ADVIL,MOTRIN) 800 MG tablet Take 1 tablet (800 mg total) by mouth 3 (three) times daily. Patient not taking: Reported on 09/04/2015 07/21/15   Noemi Chapel, MD  metroNIDAZOLE (FLAGYL) 500 MG tablet Take 1 tablet (500 mg total) by mouth 3 (three) times daily. Patient not taking: Reported on 09/04/2015 08/23/15   Merrily Pew, MD   Triage Vitals:  BP 153/68 mmHg  Pulse 105  Temp(Src) 97.8 F (36.6 C) (Oral)  Resp 17  Ht 5\' 4"  (1.626 m)  Wt 163 lb (73.936 kg)  BMI 27.97 kg/m2  SpO2 98% Physical Exam  Constitutional: She is oriented to person, place, and time. She appears well-developed and well-nourished.  HENT:  Head: Normocephalic and atraumatic.  Eyes: EOM are normal.  Neck: Normal range of motion.  Cardiovascular: Normal rate.   Pulmonary/Chest: Effort normal.  Musculoskeletal: Normal range of motion.  Neurological: She is alert and oriented to person, place, and time.  Skin: Skin is warm and dry.  Psychiatric: She has a normal mood and affect. Her behavior is normal.  Nursing note and vitals reviewed.   ED Course  Procedures (including critical care time) DIAGNOSTIC STUDIES: Oxygen Saturation is 98% on RA, normal by my interpretation.   COORDINATION OF CARE: 8:41 AM- Will review chart. Pt verbalizes understanding and agrees to plan.  Medications - No data to display  Labs Review Labs Reviewed  TROPONIN I    Imaging Review Dg Chest 2 View  09/04/2015  CLINICAL DATA:  Chest pain with sob. htn controlled by meds diabetic patient states she smokes a half cigarette a day EXAM: CHEST  2 VIEW COMPARISON:  None. FINDINGS: Normal mediastinum and cardiac silhouette. Hyperinflated lungs with. Normal pulmonary vasculature. No evidence of effusion, infiltrate, or pneumothorax. No acute bony abnormality. IMPRESSION: Mild lung hyperinflation.  No acute findings Electronically Signed   By: Suzy Bouchard M.D.   On: 09/04/2015 20:11   Ct Angio Chest Pe W Or Wo Contrast  09/04/2015  CLINICAL DATA:  Acute onset of central chest pain and shortness of breath. Initial encounter. EXAM: CT ANGIOGRAPHY CHEST WITH CONTRAST TECHNIQUE: Multidetector CT imaging of the chest was performed using the standard protocol during bolus administration of intravenous contrast. Multiplanar CT image reconstructions and MIPs were obtained to evaluate  the vascular anatomy. CONTRAST:  100 mL of Isovue 370 IV contrast COMPARISON:  Chest radiograph performed earlier today at 7:38 p.m., and CTA of the chest performed 02/11/2011 FINDINGS: There is no evidence of pulmonary embolus. The lungs are clear bilaterally. There is no evidence of significant focal consolidation, pleural effusion or pneumothorax. No masses are identified; no abnormal focal contrast enhancement is seen. Scattered coronary artery calcifications are seen. Trace pericardial fluid remains within normal limits. No mediastinal lymphadenopathy is seen. The great vessels are grossly unremarkable in appearance. No axillary lymphadenopathy is seen. The visualized portions of the thyroid gland are unremarkable in appearance. The visualized portions of the liver and spleen are unremarkable. The visualized portions of the pancreas, gallbladder, stomach, adrenal glands and kidneys are within normal limits. No acute osseous abnormalities are seen. There is mild chronic loss of height at the superior endplate of T2, new from 2012. Mild vacuum phenomenon is noted at the lower thoracic spine. Review  of the MIP images confirms the above findings. IMPRESSION: 1. No evidence of pulmonary embolus. 2. Lungs clear bilaterally. 3. Scattered coronary artery calcifications seen. 4. Mild chronic loss of height at the superior endplate of T2. Electronically Signed   By: Garald Balding M.D.   On: 09/04/2015 22:50   I have personally reviewed and evaluated these images and lab results as part of my medical decision-making.   EKG Interpretation None      MDM   Final diagnoses:  Chest pain, unspecified chest pain type    Dr. Christy Gentles in to see and examine.  He fells pt may need observation.   I spoke to Dr. Jerilee Hoh who will see and evaluate.  Hollace Kinnier Bensley, PA-C 09/05/15 [place], MD 09/05/15 1250

## 2015-09-05 NOTE — ED Provider Notes (Signed)
CSN: [id number]     Arrival date & time 09/05/15  0134 History   First MD Initiated Contact with Patient 09/05/15 0250 AM   Chief Complaint  Patient presents with  . Chest Pain     (Consider location/radiation/quality/duration/timing/severity/associated sxs/prior Treatment) HPI patient reports she started having chest pain about 5 PM this evening. She points to her left lower chest. She states the pain would come and go in lasted about 30 minutes. She was seen in the ED earlier this evening and was thoroughly evaluated. However she states about 30 minutes after she got home she got another sharp pain that lasted about 20 minutes. This was about 1 AM. The pain again is in her left lower chest area and she states now radiates towards her left shoulder. She states movements, deep breaths, and sometimes coughing makes it worse, she states nothing makes it feel better. She states she feels mildly short of breath and thinks she has some intermittent wheezing. States earlier she had nausea and vomited about 5 times. She denies any diarrhea or abdominal pain. She denies feeling dizzy or lightheaded. Patient is noted to have a Cam Walker and states she broke her right lower leg about 3 weeks ago however she has not been bedbound. She is being followed by Dr. Luna Glasgow, orthopedist. She states she's never had chest pain before. She was advised when she was discharged to return to the ED if she got chest pain again.  PCP Dr Hilma Favors  Past Medical History  Diagnosis Date  . Asthma   . Diabetes mellitus   . Hypertension   . PVD (peripheral vascular disease) ([place])   . DDD (degenerative disc disease), lumbar    Past Surgical History  Procedure Laterality Date  . Femoral artery stent  right leg  . Back surgery    . Coronary angioplasty with stent placement    . Coronary artery bypass graft     Family History  Problem Relation Age of Onset  . Stroke Mother   . Hypertension Mother   . Heart failure  Father    Social History  Substance Use Topics  . Smoking status: Current Every Day Smoker -- 0.50 packs/day for 30 years    Types: Cigarettes  . Smokeless tobacco: Never Used  . Alcohol Use: No   Smokes 1/2 cig in the am unemployed  OB History    Gravida Para Term Preterm AB TAB SAB Ectopic Multiple Living   2 1  1 1  1   1      Review of Systems  All other systems reviewed and are negative.     Allergies  Review of patient's allergies indicates no known allergies.  Home Medications   Prior to Admission medications   Medication Sig Start Date End Date Taking? Authorizing Provider  aspirin EC 81 MG tablet Take 81 mg by mouth daily.    Historical Provider, MD  budesonide-formoterol (SYMBICORT) 160-4.5 MCG/ACT inhaler Inhale 2 puffs into the lungs 2 (two) times daily.    Historical Provider, MD  Carboxymethylcellul-Glycerin (CLEAR EYES FOR DRY EYES OP) Place 2 drops into both eyes 2 (two) times daily.    Historical Provider, MD  cephALEXin (KEFLEX) 500 MG capsule Take 1 capsule (500 mg total) by mouth 3 (three) times daily. Patient not taking: Reported on 09/04/2015 08/23/15   Merrily Pew, MD  GARLIC PO Take 1 tablet by mouth daily.    Historical Provider, MD  HYDROcodone-acetaminophen (NORCO/VICODIN) 5-325 MG tablet Take 1 tablet  by mouth every 4 (four) hours as needed. Patient not taking: Reported on 09/04/2015 07/28/15   [name], NP  HYDROcodone-acetaminophen (NORCO/VICODIN) 5-325 MG tablet Take 1 tablet by mouth every 6 (six) hours as needed for moderate pain (Must last 15 days.Do not take and drive a car or use machinery.). Patient not taking: Reported on 09/04/2015 07/30/15   Sanjuana Kava, MD  ibuprofen (ADVIL,MOTRIN) 800 MG tablet Take 1 tablet (800 mg total) by mouth 3 (three) times daily. Patient not taking: Reported on 09/04/2015 07/21/15   Noemi Chapel, MD  insulin detemir (LEVEMIR) 100 UNIT/ML injection Inject 15 Units into the skin at bedtime.    Historical  Provider, MD  lisinopril (PRINIVIL,ZESTRIL) 20 MG tablet Take 20 mg by mouth daily.    Historical Provider, MD  metroNIDAZOLE (FLAGYL) 500 MG tablet Take 1 tablet (500 mg total) by mouth 3 (three) times daily. Patient not taking: Reported on 09/04/2015 08/23/15   Merrily Pew, MD  Multiple Vitamin (MULTIVITAMIN WITH MINERALS) TABS tablet Take 1 tablet by mouth daily.    Historical Provider, MD   BP 113/85 mmHg  Pulse 110  Temp(Src) 98 F (36.7 C) (Oral)  Resp 20  Ht 5\' 4"  (1.626 m)  Wt 163 lb (73.936 kg)  BMI 27.97 kg/m2  SpO2 100%  Vital signs normal except for tachycardia  Physical Exam  Constitutional: She is oriented to person, place, and time. She appears well-developed and well-nourished.  Non-toxic appearance. She does not appear ill. No distress.  HENT:  Head: Normocephalic and atraumatic.  Right Ear: External ear normal.  Left Ear: External ear normal.  Nose: Nose normal. No mucosal edema or rhinorrhea.  Mouth/Throat: Oropharynx is clear and moist and mucous membranes are normal. No dental abscesses or uvula swelling.  Eyes: Conjunctivae and EOM are normal. Pupils are equal, round, and reactive to light.  Neck: Normal range of motion and full passive range of motion without pain. Neck supple.  Cardiovascular: Normal rate, regular rhythm and normal heart sounds.  Exam reveals no gallop and no friction rub.   No murmur heard. Pulmonary/Chest: Effort normal and breath sounds normal. No respiratory distress. She has no wheezes. She has no rhonchi. She has no rales. She exhibits no tenderness and no crepitus.  Abdominal: Soft. Normal appearance and bowel sounds are normal. She exhibits no distension. There is no tenderness. There is no rebound and no guarding.  Musculoskeletal: Normal range of motion. She exhibits no edema or tenderness.  Moves all extremities well.   Neurological: She is alert and oriented to person, place, and time. She has normal strength. No cranial nerve  deficit.  Skin: Skin is warm, dry and intact. No rash noted. No erythema. No pallor.  Psychiatric: She has a normal mood and affect. Her speech is normal and behavior is normal. Her mood appears not anxious.  Nursing note and vitals reviewed.   ED Course  Procedures (including critical care time)  Medications  albuterol (PROVENTIL) (2.5 MG/3ML) 0.083% nebulizer solution 5 mg (5 mg Nebulization Given 09/05/15 0327)  ipratropium (ATROVENT) nebulizer solution 0.5 mg (0.5 mg Nebulization Given 09/05/15 0327)  ketorolac (TORADOL) 30 MG/ML injection 30 mg (30 mg Intramuscular Given 09/05/15 0341)    Patient had been seen just prior to this ED visit for similar complaints. She had a CT angiogram of her chest which is negative for PE, her troponins were negative, her d-dimer was mildly elevated at 0.9.   5 AM I was coming out of her  room 10 and was going to go talk to this patient again. However the nurses cleaning her room. Evidently this patient was discharged with another patient's discharge instructions.  7:30 AM charge nurse is going to call patient to return to the ED to finish her evaluation and to bring the discharge papers she was given earlier.  Labs Review Results for orders placed or performed during the hospital encounter of 09/05/15  Troponin I  Result Value Ref Range   Troponin I <0.03 <0.031 ng/mL    Laboratory interpretation all normal    Imaging Review Dg Chest 2 View  09/04/2015  CLINICAL DATA:  Chest pain with sob. htn controlled by meds diabetic patient states she smokes a half cigarette a day EXAM: CHEST  2 VIEW COMPARISON:  None. FINDINGS: Normal mediastinum and cardiac silhouette. Hyperinflated lungs with. Normal pulmonary vasculature. No evidence of effusion, infiltrate, or pneumothorax. No acute bony abnormality. IMPRESSION: Mild lung hyperinflation.  No acute findings Electronically Signed   By: Suzy Bouchard M.D.   On: 09/04/2015 20:11   Ct Angio Chest Pe W Or  Wo Contrast  09/04/2015  CLINICAL DATA:  Acute onset of central chest pain and shortness of breath. Initial encounter. EXAM: CT ANGIOGRAPHY CHEST WITH CONTRAST TECHNIQUE: Multidetector CT imaging of the chest was performed using the standard protocol during bolus administration of intravenous contrast. Multiplanar CT image reconstructions and MIPs were obtained to evaluate the vascular anatomy. CONTRAST:  100 mL of Isovue 370 IV contrast COMPARISON:  Chest radiograph performed earlier today at 7:38 p.m., and CTA of the chest performed 02/11/2011 FINDINGS: There is no evidence of pulmonary embolus. The lungs are clear bilaterally. There is no evidence of significant focal consolidation, pleural effusion or pneumothorax. No masses are identified; no abnormal focal contrast enhancement is seen. Scattered coronary artery calcifications are seen. Trace pericardial fluid remains within normal limits. No mediastinal lymphadenopathy is seen. The great vessels are grossly unremarkable in appearance. No axillary lymphadenopathy is seen. The visualized portions of the thyroid gland are unremarkable in appearance. The visualized portions of the liver and spleen are unremarkable. The visualized portions of the pancreas, gallbladder, stomach, adrenal glands and kidneys are within normal limits. No acute osseous abnormalities are seen. There is mild chronic loss of height at the superior endplate of T2, new from 2012. Mild vacuum phenomenon is noted at the lower thoracic spine. Review of the MIP images confirms the above findings. IMPRESSION: 1. No evidence of pulmonary embolus. 2. Lungs clear bilaterally. 3. Scattered coronary artery calcifications seen. 4. Mild chronic loss of height at the superior endplate of T2. Electronically Signed   By: Garald Balding M.D.   On: 09/04/2015 22:50   I have personally reviewed and evaluated these images and lab results as part of my medical decision-making.   ED ECG REPORT   Date:  09/05/2015  Rate: 99  Rhythm: normal sinus rhythm  QRS Axis: normal  Intervals: normal  ST/T Wave abnormalities: normal  Conduction Disutrbances:low voltage precordial leads  Narrative Interpretation:   Old EKG Reviewed: changes noted heart rate is now slower  I have personally reviewed the EKG tracing and agree with the computerized printout as noted.  MDM   Final diagnoses:  Chest pain, unspecified chest pain type    Disposition patient is being called back to the emergency department to finish her evaluation for chest pain.  Rolland Porter, MD, [place], MD 09/05/15 [phone]

## 2015-09-05 NOTE — ED Provider Notes (Signed)
CSN: [id number]     Arrival date & time 09/05/15  1613 History   First MD Initiated Contact with Patient 09/05/15 1711     Chief Complaint  Patient presents with  . Chest Pain     (Consider location/radiation/quality/duration/timing/severity/associated sxs/prior Treatment) HPI   50 year old female chest pain. Sharp. Left-sided. Onset shortly before arrival. Last about 20-30 minutes and has since resolved. Denies any other associated symptoms such as dyspnea, palpitations, diaphoresis or nausea. No unusual leg pain or swelling. No fevers or chills. No cough. Has not tried taking anything for her symptoms specifically.  Past Medical History  Diagnosis Date  . Asthma   . Diabetes mellitus   . Hypertension   . PVD (peripheral vascular disease) ([place])   . DDD (degenerative disc disease), lumbar    Past Surgical History  Procedure Laterality Date  . Femoral artery stent  right leg  . Back surgery    . Coronary angioplasty with stent placement    . Coronary artery bypass graft     Family History  Problem Relation Age of Onset  . Stroke Mother   . Hypertension Mother   . Heart failure Father    Social History  Substance Use Topics  . Smoking status: Current Every Day Smoker -- 0.50 packs/day for 30 years    Types: Cigarettes  . Smokeless tobacco: Never Used  . Alcohol Use: No   OB History    Gravida Para Term Preterm AB TAB SAB Ectopic Multiple Living   2 1  1 1  1   1      Review of Systems  All systems reviewed and negative, other than as noted in HPI.   Allergies  Review of patient's allergies indicates no known allergies.  Home Medications   Prior to Admission medications   Medication Sig Start Date End Date Taking? Authorizing Provider  aspirin EC 81 MG tablet Take 81 mg by mouth every morning.    Yes Historical Provider, MD  budesonide-formoterol (SYMBICORT) 160-4.5 MCG/ACT inhaler Inhale 2 puffs into the lungs 2 (two) times daily.   Yes Historical Provider, MD   Carboxymethylcellul-Glycerin (CLEAR EYES FOR DRY EYES OP) Place 2 drops into both eyes 2 (two) times daily.   Yes Historical Provider, MD  esomeprazole (NEXIUM) 20 MG capsule Take 20 mg by mouth daily at 12 noon.   Yes Historical Provider, MD  GARLIC PO Take 1 tablet by mouth daily.   Yes Historical Provider, MD  insulin detemir (LEVEMIR) 100 UNIT/ML injection Inject 15 Units into the skin at bedtime.   Yes Historical Provider, MD  lisinopril (PRINIVIL,ZESTRIL) 20 MG tablet Take 20 mg by mouth daily.   Yes Historical Provider, MD  Multiple Vitamin (MULTIVITAMIN WITH MINERALS) TABS tablet Take 1 tablet by mouth daily.   Yes Historical Provider, MD   BP 158/75 mmHg  Pulse 108  Temp(Src) 98 F (36.7 C) (Oral)  Resp 24  SpO2 98% Physical Exam  Constitutional: She is oriented to person, place, and time. She appears well-developed and well-nourished. No distress.  HENT:  Head: Normocephalic and atraumatic.  Eyes: Conjunctivae are normal. Right eye exhibits no discharge. Left eye exhibits no discharge.  Neck: Neck supple.  Cardiovascular: Normal rate, regular rhythm and normal heart sounds.  Exam reveals no gallop and no friction rub.   No murmur heard. Pulmonary/Chest: Effort normal and breath sounds normal. No respiratory distress.  Abdominal: Soft. She exhibits no distension. There is no tenderness.  Musculoskeletal: She exhibits no edema or  tenderness.  Neurological: She is alert and oriented to person, place, and time.  Skin: Skin is warm and dry.  Psychiatric: She has a normal mood and affect. Her behavior is normal. Thought content normal.  Nursing note and vitals reviewed.   ED Course  Procedures (including critical care time) Labs Review Labs Reviewed  BASIC METABOLIC PANEL - Abnormal; Notable for the following:    Sodium 131 (*)    CO2 18 (*)    Glucose, Bld 563 (*)    All other components within normal limits  URINALYSIS, ROUTINE W REFLEX MICROSCOPIC (NOT AT [facility]) -  Abnormal; Notable for the following:    Specific Gravity, Urine 1.041 (*)    Glucose, UA >1000 (*)    Ketones, ur 40 (*)    All other components within normal limits  URINE MICROSCOPIC-ADD ON - Abnormal; Notable for the following:    Squamous Epithelial / LPF 0-5 (*)    Bacteria, UA RARE (*)    All other components within normal limits  CBG MONITORING, ED - Abnormal; Notable for the following:    Glucose-Capillary 173 (*)    All other components within normal limits  CBC  I-STAT TROPOININ, ED    Imaging Review Dg Chest 2 View  09/05/2015  CLINICAL DATA:  LEFT side chest pain, LEFT shoulder pain, shortness of breath and vomiting beginning yesterday, hypertension, diabetes mellitus, asthma, 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 Atherosclerotic calcification aorta. Lungs clear. No pleural effusion or pneumothorax. Bones unremarkable. IMPRESSION: No acute abnormalities. Aortic atherosclerosis. Electronically Signed   By: Lavonia Dana M.D.   On: 09/05/2015 16:53   Dg Chest 2 View  09/04/2015  CLINICAL DATA:  Chest pain with sob. htn controlled by meds diabetic patient states she smokes a half cigarette a day EXAM: CHEST  2 VIEW COMPARISON:  None. FINDINGS: Normal mediastinum and cardiac silhouette. Hyperinflated lungs with. Normal pulmonary vasculature. No evidence of effusion, infiltrate, or pneumothorax. No acute bony abnormality. IMPRESSION: Mild lung hyperinflation.  No acute findings Electronically Signed   By: Suzy Bouchard M.D.   On: 09/04/2015 20:11   Ct Angio Chest Pe W Or Wo Contrast  09/04/2015  CLINICAL DATA:  Acute onset of central chest pain and shortness of breath. Initial encounter. EXAM: CT ANGIOGRAPHY CHEST WITH CONTRAST TECHNIQUE: Multidetector CT imaging of the chest was performed using the standard protocol during bolus administration  of intravenous contrast. Multiplanar CT image reconstructions and MIPs were obtained to evaluate the vascular anatomy. CONTRAST:  100 mL of Isovue 370 IV contrast COMPARISON:  Chest radiograph performed earlier today at 7:38 p.m., and CTA of the chest performed 02/11/2011 FINDINGS: There is no evidence of pulmonary embolus. The lungs are clear bilaterally. There is no evidence of significant focal consolidation, pleural effusion or pneumothorax. No masses are identified; no abnormal focal contrast enhancement is seen. Scattered coronary artery calcifications are seen. Trace pericardial fluid remains within normal limits. No mediastinal lymphadenopathy is seen. The great vessels are grossly unremarkable in appearance. No axillary lymphadenopathy is seen. The visualized portions of the thyroid gland are unremarkable in appearance. The visualized portions of the liver and spleen are unremarkable. The visualized portions of the pancreas, gallbladder, stomach, adrenal glands and kidneys are within normal limits. No acute osseous abnormalities are seen. There is mild chronic loss of height at the superior endplate of T2, new from 2012.  Mild vacuum phenomenon is noted at the lower thoracic spine. Review of the MIP images confirms the above findings. IMPRESSION: 1. No evidence of pulmonary embolus. 2. Lungs clear bilaterally. 3. Scattered coronary artery calcifications seen. 4. Mild chronic loss of height at the superior endplate of T2. Electronically Signed   By: Garald Balding M.D.   On: 09/04/2015 22:50   I have personally reviewed and evaluated these images and lab results as part of my medical decision-making.   EKG Interpretation   Date/Time:  Saturday September 05 2015 01:49:29 EDT Ventricular Rate:  99 PR Interval:    QRS Duration: 99 QT Interval:  343 QTC Calculation: 441 R Axis:   51 Text Interpretation:  Sinus rhythm Low voltage, precordial leads Baseline  wander in lead(s) Since last tracing rate  slower (04 Sep 2015) Confirmed  by [employer]  MD-I, IVA ([id number]) on 09/05/2015 4:27:21 AM Also confirmed by  [facility]  MD-I, IVA ([id number]), editor Yehuda Mao [phone])  on 09/06/2015  9:53:30 AM      MDM   Final diagnoses:  Chest pain, unspecified chest pain type  Hyperglycemia    50 year old female with chest pain. She certainly does have risk factors for ACS, but my suspicion for this currently is pretty low. Symptoms seem fairly atypical for ACS right now. ED workup is been fairly unremarkable. Her symptoms have improved. Her EKG is not significantly changed. Doubt PE, dissection or other emergent process.    Virgel Manifold, MD 09/10/15 1226

## 2015-09-13 ENCOUNTER — Emergency Department (HOSPITAL_COMMUNITY): Payer: BLUE CROSS/BLUE SHIELD

## 2015-09-13 ENCOUNTER — Encounter (HOSPITAL_COMMUNITY): Payer: Self-pay

## 2015-09-13 ENCOUNTER — Emergency Department (HOSPITAL_COMMUNITY)
Admission: EM | Admit: 2015-09-13 | Discharge: 2015-09-14 | Disposition: A | Discharge status: Home / Self Care | Payer: BLUE CROSS/BLUE SHIELD | Attending: Emergency Medicine | Admitting: Emergency Medicine

## 2015-09-13 DIAGNOSIS — E1151 Type 2 diabetes mellitus with diabetic peripheral angiopathy without gangrene: Secondary | ICD-10-CM | POA: Insufficient documentation

## 2015-09-13 DIAGNOSIS — Z7982 Long term (current) use of aspirin: Secondary | ICD-10-CM | POA: Diagnosis not present

## 2015-09-13 DIAGNOSIS — Z79899 Other long term (current) drug therapy: Secondary | ICD-10-CM | POA: Insufficient documentation

## 2015-09-13 DIAGNOSIS — E1165 Type 2 diabetes mellitus with hyperglycemia: Secondary | ICD-10-CM | POA: Diagnosis present

## 2015-09-13 DIAGNOSIS — E872 Acidosis: Secondary | ICD-10-CM | POA: Insufficient documentation

## 2015-09-13 DIAGNOSIS — Z794 Long term (current) use of insulin: Secondary | ICD-10-CM | POA: Insufficient documentation

## 2015-09-13 DIAGNOSIS — F17210 Nicotine dependence, cigarettes, uncomplicated: Secondary | ICD-10-CM | POA: Insufficient documentation

## 2015-09-13 DIAGNOSIS — E876 Hypokalemia: Secondary | ICD-10-CM | POA: Diagnosis not present

## 2015-09-13 DIAGNOSIS — E1169 Type 2 diabetes mellitus with other specified complication: Secondary | ICD-10-CM | POA: Insufficient documentation

## 2015-09-13 DIAGNOSIS — J45909 Unspecified asthma, uncomplicated: Secondary | ICD-10-CM | POA: Diagnosis not present

## 2015-09-13 DIAGNOSIS — R739 Hyperglycemia, unspecified: Secondary | ICD-10-CM

## 2015-09-13 DIAGNOSIS — E1110 Type 2 diabetes mellitus with ketoacidosis without coma: Secondary | ICD-10-CM

## 2015-09-13 DIAGNOSIS — I10 Essential (primary) hypertension: Secondary | ICD-10-CM | POA: Insufficient documentation

## 2015-09-13 LAB — CBG MONITORING, ED: GLUCOSE-CAPILLARY: 391 mg/dL — AB (ref 65–99)

## 2015-09-13 MED ORDER — SODIUM CHLORIDE 0.9 % IV BOLUS (SEPSIS)
1000.0000 mL | Freq: Once | INTRAVENOUS | Status: AC
  Administered 2015-09-13: 1000 mL via INTRAVENOUS

## 2015-09-13 MED ORDER — ONDANSETRON HCL 4 MG/2ML IJ SOLN
4.0000 mg | Freq: Once | INTRAMUSCULAR | Status: AC
  Administered 2015-09-13: 4 mg via INTRAVENOUS
  Filled 2015-09-13: qty 2

## 2015-09-13 NOTE — ED Provider Notes (Addendum)
By signing my name below, I, [facility], attest that this documentation has been prepared under the direction and in the presence of [employer], DO. Electronically Signed: Virgel Bouquet, ED Scribe. 09/13/2015. 11:29 PM.  TIME SEEN: 11:25 PM  CHIEF COMPLAINT: Hyperglycemia  HPI: Alexandria Webster is a 50 y.o. female with history of insulin-dependent diabetes, hypertension, asthma who presents to the Emergency Department complaining of constant, mild nausea onset earlier today that she associates with hyperglycemia. Pt states that she had an elevated CBG at home that measured in the 500s. She reports nausea, vomiting, SOB, blurred vision, and anxiety that all began today. She has not attempted any breathing treatments at home. She takes 15 mg Levemir once at night. She smokes one-half a a cigarette daily. Denies illicit drug use or ETOH consumption. Denies diarrhea, chest or abdominal pain, dysuria, hematuria, vaginal bleeding, vaginal discharge.  Patient immediately peritoneal fat with her brother who states that she reported she wanted to kill herself if we did not give her Ativan. Patient adamantly denies SI, HI or hallucinations. She denies that she is withdrawing from any medications, illicit drugs or alcohol. Patient was seen in the emergency department 4 times between June 23 of June 24 for chest pain. She had multiple negative troponins and a negative CT angio of her chest.  ROS: See HPI Constitutional: no fever  Eyes: no drainage  ENT: no runny nose   Cardiovascular:  no chest pain  Resp: SOB  GI: no vomiting GU: no dysuria Integumentary: no rash  Allergy: no hives  Musculoskeletal: no leg swelling  Neurological: no slurred speech ROS otherwise negative  PAST MEDICAL HISTORY/PAST SURGICAL HISTORY:  Past Medical History  Diagnosis Date  . Asthma   . Diabetes mellitus   . Hypertension   . PVD (peripheral vascular disease) ([place])   . DDD (degenerative disc disease),  lumbar     MEDICATIONS:  Prior to Admission medications   Medication Sig Start Date End Date Taking? Authorizing Provider  aspirin EC 81 MG tablet Take 81 mg by mouth every morning.     Historical Provider, MD  budesonide-formoterol (SYMBICORT) 160-4.5 MCG/ACT inhaler Inhale 2 puffs into the lungs 2 (two) times daily.    Historical Provider, MD  Carboxymethylcellul-Glycerin (CLEAR EYES FOR DRY EYES OP) Place 2 drops into both eyes 2 (two) times daily.    Historical Provider, MD  esomeprazole (NEXIUM) 20 MG capsule Take 20 mg by mouth daily at 12 noon.    Historical Provider, MD  GARLIC PO Take 1 tablet by mouth daily.    Historical Provider, MD  insulin detemir (LEVEMIR) 100 UNIT/ML injection Inject 15 Units into the skin at bedtime.    Historical Provider, MD  lisinopril (PRINIVIL,ZESTRIL) 20 MG tablet Take 20 mg by mouth daily.    Historical Provider, MD  Multiple Vitamin (MULTIVITAMIN WITH MINERALS) TABS tablet Take 1 tablet by mouth daily.    Historical Provider, MD    ALLERGIES:  No Known Allergies  SOCIAL HISTORY:  Social History  Substance Use Topics  . Smoking status: Current Every Day Smoker -- 0.50 packs/day for 30 years    Types: Cigarettes  . Smokeless tobacco: Never Used  . Alcohol Use: No    FAMILY HISTORY: Family History  Problem Relation Age of Onset  . Stroke Mother   . Hypertension Mother   . Heart failure Father     EXAM: BP 145/107 mmHg  Pulse 124  Temp(Src) 98 F (36.7 C) (Oral)  Resp 18  Ht 5\' 4"  (1.626 m)  Wt 163 lb (73.936 kg)  BMI 27.97 kg/m2  SpO2 100% CONSTITUTIONAL: Alert and oriented and responds appropriately to questions. Chronically ill appearing but afebrile and nontoxic.  HEAD: Normocephalic EYES: Conjunctivae clear, PERRL ENT: normal nose; no rhinorrhea; moist mucous membranes NECK: Supple, no meningismus, no LAD  CARD: Regular and tachycardic; S1 and S2 appreciated; no murmurs, no clicks, no rubs, no gallops RESP: Normal chest  excursion without splinting or tachypnea; breath sounds clear and equal bilaterally; no wheezes, no rhonchi, no rales, no hypoxia or respiratory distress, speaking full sentences ABD/GI: Normal bowel sounds; non-distended; soft, non-tender, no rebound, no guarding, no peritoneal signs BACK:  The back appears normal and is non-tender to palpation, there is no CVA tenderness EXT: Normal ROM in all joints; non-tender to palpation; no edema; normal capillary refill; no cyanosis, no calf tenderness or swelling    SKIN: Normal color for age and race; warm; no rash NEURO: Moves all extremities equally, sensation to light touch intact diffusely, cranial nerves II through XII intact. Pt is tremulous PSYCH: Appears disheveled. Denies SI, HI, or hallucinations. Pt contracts for safety.  MEDICAL DECISION MAKING: Patient here with multiple different complaints. Complaining of hyperglycemia. Take her Levemir prior to arrival and her blood glucose is now 391. She is tachycardic, tremulous on exam. She denies any withdrawal. Denies any chest pain. Does state she feel short of breath but states she has asthma. Her lungs are clear. States she has not been using albuterol at home. Denies any other stimulant use. We'll give IV fluids, check cardiac labs, chest x-ray. We'll check urinalysis, urine drug screen. Patient contracts for safety. She denies SI, HI or hallucinations currently.  ED PROGRESS: 1:40 AM  Pt's heart rate is now on the 80s. She states she is feeling better. Her labs show that her glucose is also improved with IV hydration. Her bicarbonate was slightly low at 21 but her anion gap is 14. She does have large ketones in her urine. Will give a third liter of fluid and recheck her labs. This time I do not think that she is in DKA and needs an insulin drip. Denies that she's ever been in DKA before.   4:15 AM  Pt's repeat labs were run in error off the original tube. Repeat Chem-8 after 3 L of IV fluid show  sodium 143, potassium 3.5, chloride 107, bicarbonate 21, glucose 250. Her anion gap is calculated at 15. Serum Beta hydroxybutyrate pending.   4:30 AM  Pt's beta hydroxybutyrate is elevated. Will start insulin drip and IV hydration. We'll discuss with hospitalist.   4:45 AM  D/w Dr. Darrick Meigs with hospitalist service. Patient is in mild DKA. He recommends starting her on D5 half-normal saline at 75 mL per hour and starting her on an insulin drip. He recommends checking her blood glucose every hour and then repeating a BMP at 7 AM. He states of her anion gap has closed and her bicarbonate has improved that she can be discharged. Patient is comfortable with this plan.   7:00 AM  Repeat BMP pending.  Blood glucose is now 180. Patient is still fine with plan for discharge home. Discussed with her return precautions and discussed diet control. Recommend close PCP follow-up. Plan is to discharge patient home if BMP does not show any significant worsening.   I reviewed all nursing notes, vitals, pertinent old records, EKGs, labs, imaging (as available).   7:15 AM  Pt  now dry heaving. Is not complaining of any abdominal pain. Abdominal exam is still benign. Blood glucose is 152. We'll give another dose of Zofran. She seems more tremulous than before. Still denies any illicit drug or alcohol use. Will give dose of IV Ativan. Patient's brother has called to the emergency department several times to discuss patient's care. He continually curses at staff, yells over the phone and then hangs up. Discussed with patient that she can relay any information about her care to her brother herself. He is not providing any extra helpful information for this patient.  Signed out to oncoming EDP who will follow up on BMP.    EKG Interpretation  Date/Time:  Sunday September 13 2015 23:07:08 EDT Ventricular Rate:  104 PR Interval:    QRS Duration: 77 QT Interval:  322 QTC Calculation: 424 R Axis:   64 Text Interpretation:   Sinus tachycardia Anteroseptal infarct, old No significant change since last tracing Confirmed by Shaun Zuccaro,  DO, Lavetta Geier ([id number]) on 09/13/2015 11:32:21 PM       CRITICAL CARE Performed by: Nyra Jabs   Total critical care time: 45 minutes  Critical care time was exclusive of separately billable procedures and treating other patients.  Critical care was necessary to treat or prevent imminent or life-threatening deterioration.  Critical care was time spent personally by me on the following activities: development of treatment plan with patient and/or surrogate as well as nursing, discussions with consultants, evaluation of patient's response to treatment, examination of patient, obtaining history from patient or surrogate, ordering and performing treatments and interventions, ordering and review of laboratory studies, ordering and review of radiographic studies, pulse oximetry and re-evaluation of patient's condition.   I personally performed the services described in this documentation, which was scribed in my presence. The recorded information has been reviewed and is accurate.    [place], DO 09/14/15 [place], DO 09/14/15 [id number]

## 2015-09-13 NOTE — ED Notes (Signed)
Patient states tremors, nervousness, and high blood sugar started 2130. Patient states she took 15units of insulin PTA. CBG 391 in triage.

## 2015-09-14 ENCOUNTER — Encounter: Payer: Self-pay | Admitting: "Endocrinology

## 2015-09-14 ENCOUNTER — Ambulatory Visit (INDEPENDENT_AMBULATORY_CARE_PROVIDER_SITE_OTHER): Payer: BLUE CROSS/BLUE SHIELD | Admitting: "Endocrinology

## 2015-09-14 VITALS — BP 136/83 | HR 103 | Ht 64.0 in | Wt 132.0 lb

## 2015-09-14 DIAGNOSIS — Z794 Long term (current) use of insulin: Secondary | ICD-10-CM

## 2015-09-14 DIAGNOSIS — IMO0002 Reserved for concepts with insufficient information to code with codable children: Secondary | ICD-10-CM

## 2015-09-14 DIAGNOSIS — Z9119 Patient's noncompliance with other medical treatment and regimen: Secondary | ICD-10-CM

## 2015-09-14 DIAGNOSIS — E1165 Type 2 diabetes mellitus with hyperglycemia: Secondary | ICD-10-CM | POA: Diagnosis not present

## 2015-09-14 DIAGNOSIS — Z91199 Patient's noncompliance with other medical treatment and regimen due to unspecified reason: Secondary | ICD-10-CM | POA: Insufficient documentation

## 2015-09-14 DIAGNOSIS — I10 Essential (primary) hypertension: Secondary | ICD-10-CM

## 2015-09-14 DIAGNOSIS — E118 Type 2 diabetes mellitus with unspecified complications: Secondary | ICD-10-CM

## 2015-09-14 LAB — CBC WITH DIFFERENTIAL/PLATELET
BASOS PCT: 0 %
Basophils Absolute: 0 10*3/uL (ref 0.0–0.1)
Eosinophils Absolute: 0 10*3/uL (ref 0.0–0.7)
Eosinophils Relative: 1 %
HEMATOCRIT: 41.8 % (ref 36.0–46.0)
Hemoglobin: 14.8 g/dL (ref 12.0–15.0)
LYMPHS ABS: 1.8 10*3/uL (ref 0.7–4.0)
LYMPHS PCT: 23 %
MCH: 33.6 pg (ref 26.0–34.0)
MCHC: 35.4 g/dL (ref 30.0–36.0)
MCV: 94.8 fL (ref 78.0–100.0)
MONO ABS: 0.5 10*3/uL (ref 0.1–1.0)
MONOS PCT: 7 %
NEUTROS ABS: 5.3 10*3/uL (ref 1.7–7.7)
Neutrophils Relative %: 69 %
Platelets: 214 10*3/uL (ref 150–400)
RBC: 4.41 MIL/uL (ref 3.87–5.11)
RDW: 12.6 % (ref 11.5–15.5)
WBC: 7.7 10*3/uL (ref 4.0–10.5)

## 2015-09-14 LAB — I-STAT CHEM 8, ED
BUN: 7 mg/dL (ref 6–20)
CALCIUM ION: 1.15 mmol/L (ref 1.13–1.30)
CHLORIDE: 107 mmol/L (ref 101–111)
CREATININE: 0.4 mg/dL — AB (ref 0.44–1.00)
GLUCOSE: 250 mg/dL — AB (ref 65–99)
HCT: 39 % (ref 36.0–46.0)
Hemoglobin: 13.3 g/dL (ref 12.0–15.0)
Potassium: 3.5 mmol/L (ref 3.5–5.1)
Sodium: 143 mmol/L (ref 135–145)
TCO2: 21 mmol/L (ref 0–100)

## 2015-09-14 LAB — COMPREHENSIVE METABOLIC PANEL
ALT: 13 U/L — ABNORMAL LOW (ref 14–54)
ANION GAP: 14 (ref 5–15)
AST: 13 U/L — ABNORMAL LOW (ref 15–41)
Albumin: 4.2 g/dL (ref 3.5–5.0)
Alkaline Phosphatase: 51 U/L (ref 38–126)
BILIRUBIN TOTAL: 1.2 mg/dL (ref 0.3–1.2)
BUN: 14 mg/dL (ref 6–20)
CALCIUM: 9.7 mg/dL (ref 8.9–10.3)
CO2: 21 mmol/L — ABNORMAL LOW (ref 22–32)
Chloride: 101 mmol/L (ref 101–111)
Creatinine, Ser: 0.77 mg/dL (ref 0.44–1.00)
Glucose, Bld: 391 mg/dL — ABNORMAL HIGH (ref 65–99)
POTASSIUM: 3.3 mmol/L — AB (ref 3.5–5.1)
Sodium: 136 mmol/L (ref 135–145)
TOTAL PROTEIN: 7.5 g/dL (ref 6.5–8.1)

## 2015-09-14 LAB — TROPONIN I: Troponin I: <0.03 ng/mL (ref <0.03)

## 2015-09-14 LAB — URINE MICROSCOPIC-ADD ON

## 2015-09-14 LAB — LIPASE, BLOOD: LIPASE: 22 U/L (ref 11–51)

## 2015-09-14 LAB — CBG MONITORING, ED
GLUCOSE-CAPILLARY: 221 mg/dL — AB (ref 65–99)
GLUCOSE-CAPILLARY: 175 mg/dL — AB (ref 65–99)
Glucose-Capillary: 314 mg/dL — ABNORMAL HIGH (ref 65–99)
Glucose-Capillary: 238 mg/dL — ABNORMAL HIGH (ref 65–99)
Glucose-Capillary: 180 mg/dL — ABNORMAL HIGH (ref 65–99)
Glucose-Capillary: 152 mg/dL — ABNORMAL HIGH (ref 65–99)
Glucose-Capillary: 175 mg/dL — ABNORMAL HIGH (ref 65–99)

## 2015-09-14 LAB — BASIC METABOLIC PANEL
ANION GAP: 7 (ref 5–15)
Anion gap: 15 (ref 5–15)
BUN: 10 mg/dL (ref 6–20)
BUN: 7 mg/dL (ref 6–20)
CALCIUM: 8.2 mg/dL — AB (ref 8.9–10.3)
CALCIUM: 8.3 mg/dL — AB (ref 8.9–10.3)
CHLORIDE: 111 mmol/L (ref 101–111)
CHLORIDE: 110 mmol/L (ref 101–111)
CO2: 19 mmol/L — AB (ref 22–32)
CO2: 21 mmol/L — AB (ref 22–32)
CREATININE: 0.55 mg/dL (ref 0.44–1.00)
Creatinine, Ser: 0.48 mg/dL (ref 0.44–1.00)
GFR calc Af Amer: >60 mL/min (ref >60)
GFR calc Af Amer: >60 mL/min (ref >60)
GFR calc non Af Amer: >60 mL/min (ref >60)
GLUCOSE: 262 mg/dL — AB (ref 65–99)
GLUCOSE: 163 mg/dL — AB (ref 65–99)
POTASSIUM: 3 mmol/L — AB (ref 3.5–5.1)
Potassium: 3.5 mmol/L (ref 3.5–5.1)
Sodium: 139 mmol/L (ref 135–145)
Sodium: 138 mmol/L (ref 135–145)

## 2015-09-14 LAB — RAPID URINE DRUG SCREEN, HOSP PERFORMED
Amphetamines: NOT DETECTED
BARBITURATES: NOT DETECTED
Benzodiazepines: NOT DETECTED
COCAINE: NOT DETECTED
OPIATES: NOT DETECTED
Tetrahydrocannabinol: NOT DETECTED

## 2015-09-14 LAB — URINALYSIS, ROUTINE W REFLEX MICROSCOPIC
Glucose, UA: >1000 mg/dL — AB
Hgb urine dipstick: NEGATIVE
LEUKOCYTES UA: NEGATIVE
NITRITE: NEGATIVE
Specific Gravity, Urine: 1.02 (ref 1.005–1.030)
pH: 5.5 (ref 5.0–8.0)

## 2015-09-14 LAB — BETA-HYDROXYBUTYRIC ACID: Beta-Hydroxybutyric Acid: 5.25 mmol/L — ABNORMAL HIGH (ref 0.05–0.27)

## 2015-09-14 MED ORDER — SODIUM CHLORIDE 0.9 % IV BOLUS (SEPSIS)
1000.0000 mL | Freq: Once | INTRAVENOUS | Status: AC
  Administered 2015-09-14: 1000 mL via INTRAVENOUS

## 2015-09-14 MED ORDER — INSULIN GLARGINE 300 UNIT/ML ~~LOC~~ SOPN: 20.0000 [IU] | PEN_INJECTOR | Freq: Every day | SUBCUTANEOUS | Status: DC

## 2015-09-14 MED ORDER — POTASSIUM CHLORIDE CRYS ER 20 MEQ PO TBCR
40.0000 meq | EXTENDED_RELEASE_TABLET | Freq: Once | ORAL | Status: AC
  Administered 2015-09-14: 40 meq via ORAL
  Filled 2015-09-14: qty 2

## 2015-09-14 MED ORDER — DEXTROSE-NACL 5-0.45 % IV SOLN: INTRAVENOUS | Status: DC

## 2015-09-14 MED ORDER — SODIUM CHLORIDE 0.9 % IV SOLN
INTRAVENOUS | Status: AC
  Filled 2015-09-14: qty 2.5

## 2015-09-14 MED ORDER — SODIUM CHLORIDE 0.9 % IV SOLN
INTRAVENOUS | Status: DC
  Filled 2015-09-14: qty 2.5

## 2015-09-14 MED ORDER — POTASSIUM CHLORIDE CRYS ER 20 MEQ PO TBCR: 40.0000 meq | EXTENDED_RELEASE_TABLET | Freq: Every day | ORAL | Status: DC

## 2015-09-14 MED ORDER — ONDANSETRON HCL 4 MG/2ML IJ SOLN
4.0000 mg | Freq: Once | INTRAMUSCULAR | Status: AC
  Administered 2015-09-14: 4 mg via INTRAVENOUS
  Filled 2015-09-14: qty 2

## 2015-09-14 MED ORDER — SODIUM CHLORIDE 0.9 % IV SOLN: INTRAVENOUS | Status: DC

## 2015-09-14 MED ORDER — SODIUM CHLORIDE 0.9 % IV SOLN
INTRAVENOUS | Status: DC
  Administered 2015-09-14: 1.6 [IU]/h via INTRAVENOUS
  Filled 2015-09-14: qty 2.5

## 2015-09-14 MED ORDER — ONDANSETRON 4 MG PO TBDP: 4.0000 mg | ORAL_TABLET | Freq: Three times a day (TID) | ORAL | Status: DC | PRN

## 2015-09-14 MED ORDER — GLUCOSE BLOOD VI STRP: ORAL_STRIP | Status: DC

## 2015-09-14 MED ORDER — DEXTROSE-NACL 5-0.45 % IV SOLN
INTRAVENOUS | Status: DC
  Administered 2015-09-14: 06:00:00 via INTRAVENOUS

## 2015-09-14 MED ORDER — LORAZEPAM 2 MG/ML IJ SOLN
1.0000 mg | Freq: Once | INTRAMUSCULAR | Status: AC
  Administered 2015-09-14: 1 mg via INTRAVENOUS
  Filled 2015-09-14: qty 1

## 2015-09-14 NOTE — ED Provider Notes (Signed)
Patient's care signed out to me to follow-up repeat BMP. Patient not actively vomiting, tolerating oral. Discussed the case with her endocrinologist that she has not seen recently. He was very helpful and will see her today before noon to review medicines.   Labs Reviewed  COMPREHENSIVE METABOLIC PANEL - Abnormal; Notable for the following:    Potassium 3.3 (*)    CO2 21 (*)    Glucose, Bld 391 (*)    AST 13 (*)    ALT 13 (*)    All other components within normal limits  URINALYSIS, ROUTINE W REFLEX MICROSCOPIC (NOT AT [facility]) - Abnormal; Notable for the following:    Glucose, UA >1000 (*)    Bilirubin Urine SMALL (*)    Ketones, ur >80 (*)    Protein, ur TRACE (*)    All other components within normal limits  URINE MICROSCOPIC-ADD ON - Abnormal; Notable for the following:    Squamous Epithelial / LPF 0-5 (*)    Bacteria, UA RARE (*)    All other components within normal limits  BASIC METABOLIC PANEL - Abnormal; Notable for the following:    CO2 19 (*)    Glucose, Bld 262 (*)    Calcium 8.2 (*)    All other components within normal limits  BETA-HYDROXYBUTYRIC ACID - Abnormal; Notable for the following:    Beta-Hydroxybutyric Acid 5.25 (*)    All other components within normal limits  BASIC METABOLIC PANEL - Abnormal; Notable for the following:    Potassium 3.0 (*)    CO2 21 (*)    Glucose, Bld 163 (*)    Calcium 8.3 (*)    All other components within normal limits  CBG MONITORING, ED - Abnormal; Notable for the following:    Glucose-Capillary 391 (*)    All other components within normal limits  CBG MONITORING, ED - Abnormal; Notable for the following:    Glucose-Capillary 314 (*)    All other components within normal limits  I-STAT CHEM 8, ED - Abnormal; Notable for the following:    Creatinine, Ser 0.40 (*)    Glucose, Bld 250 (*)    All other components within normal limits  CBG MONITORING, ED - Abnormal; Notable for the following:    Glucose-Capillary 238 (*)    All  other components within normal limits  CBG MONITORING, ED - Abnormal; Notable for the following:    Glucose-Capillary 221 (*)    All other components within normal limits  CBG MONITORING, ED - Abnormal; Notable for the following:    Glucose-Capillary 180 (*)    All other components within normal limits  CBG MONITORING, ED - Abnormal; Notable for the following:    Glucose-Capillary 152 (*)    All other components within normal limits  CBG MONITORING, ED - Abnormal; Notable for the following:    Glucose-Capillary 175 (*)    All other components within normal limits  CBC WITH DIFFERENTIAL/PLATELET  LIPASE, BLOOD  TROPONIN I  URINE RAPID DRUG SCREEN, HOSP PERFORMED  CBG MONITORING, ED   Oral K given, insulin drip stopped.   Results and differential diagnosis were discussed with the patient/parent/guardian. Xrays were independently reviewed by myself.  Close follow up outpatient was discussed, comfortable with the plan.   Medications  insulin regular (NOVOLIN R,HUMULIN R) 250 Units in sodium chloride 0.9 % 250 mL (1 Units/mL) infusion ( Intravenous Stopped 09/14/15 0818)  dextrose 5 %-0.45 % sodium chloride infusion ( Intravenous New Bag/Given 09/14/15 0531)  potassium chloride  SA (K-DUR,KLOR-CON) CR tablet 40 mEq (not administered)  sodium chloride 0.9 % bolus 1,000 mL (0 mLs Intravenous Stopped 09/14/15 0050)  sodium chloride 0.9 % bolus 1,000 mL (0 mLs Intravenous Stopped 09/14/15 0050)  ondansetron (ZOFRAN) injection 4 mg (4 mg Intravenous Given 09/13/15 2349)  sodium chloride 0.9 % bolus 1,000 mL (0 mLs Intravenous Stopped 09/14/15 0231)  sodium chloride 0.9 % bolus 1,000 mL (0 mLs Intravenous Stopped 09/14/15 0540)  ondansetron (ZOFRAN) injection 4 mg (4 mg Intravenous Given 09/14/15 0725)  LORazepam (ATIVAN) injection 1 mg (1 mg Intravenous Given 09/14/15 0725)    Filed Vitals:   09/14/15 0745 09/14/15 0800 09/14/15 0815 09/14/15 0830  BP:  174/90    Pulse: 93 86 94 86  Temp:      TempSrc:       Resp: 22 21 23 24   Height:      Weight:      SpO2: 97% 97% 98% 97%    Final diagnoses:  Type 2 diabetes mellitus with ketoacidosis without coma, unspecified long term insulin use status ([place])  Hyperglycemia    Alexandria Morrison, MD 09/14/15 [phone]

## 2015-09-14 NOTE — ED Notes (Signed)
CBG was taken after about 1250cc NS had infused.

## 2015-09-14 NOTE — Progress Notes (Signed)
Subjective:    Patient ID: [facility], female    DOB: 09/05/1965. Patient is being seen in consultation for management of diabetes requested by  Purvis Kilts, MD  Past Medical History  Diagnosis Date  . Asthma   . Diabetes mellitus   . Hypertension   . PVD (peripheral vascular disease) ([place])   . DDD (degenerative disc disease), lumbar    Past Surgical History  Procedure Laterality Date  . Femoral artery stent  right leg  . Back surgery    . Coronary angioplasty with stent placement    . Coronary artery bypass graft     Social History   Social History  . Marital Status: Married    Spouse Name: N/A  . Number of Children: N/A  . Years of Education: N/A   Social History Main Topics  . Smoking status: Current Every Day Smoker -- 0.50 packs/day for 30 years    Types: Cigarettes  . Smokeless tobacco: Never Used  . Alcohol Use: No  . Drug Use: No  . Sexual Activity: Yes    Birth Control/ Protection: None   Other Topics Concern  . None   Social History Narrative   Outpatient Encounter Prescriptions as of 09/14/2015  Medication Sig  . aspirin EC 81 MG tablet Take 81 mg by mouth every morning.   . budesonide-formoterol (SYMBICORT) 160-4.5 MCG/ACT inhaler Inhale 2 puffs into the lungs 2 (two) times daily.  . Carboxymethylcellul-Glycerin (CLEAR EYES FOR DRY EYES OP) Place 2 drops into both eyes 2 (two) times daily.  Marland Kitchen esomeprazole (NEXIUM) 20 MG capsule Take 20 mg by mouth daily at 12 noon.  Marland Kitchen GARLIC PO Take 1 tablet by mouth daily.  Marland Kitchen glucose blood (ONE TOUCH TEST STRIPS) test strip Use as instructed  . Insulin Glargine (TOUJEO SOLOSTAR) 300 UNIT/ML SOPN Inject 20 Units into the skin at bedtime.  Marland Kitchen lisinopril (PRINIVIL,ZESTRIL) 20 MG tablet Take 20 mg by mouth daily.  . Multiple Vitamin (MULTIVITAMIN WITH MINERALS) TABS tablet Take 1 tablet by mouth daily.  . ondansetron (ZOFRAN ODT) 4 MG disintegrating tablet Take 1 tablet (4 mg total) by mouth every 8 (eight)  hours as needed for nausea or vomiting.  . potassium chloride SA (K-DUR,KLOR-CON) 20 MEQ tablet Take 2 tablets (40 mEq total) by mouth daily.  . [DISCONTINUED] insulin detemir (LEVEMIR) 100 UNIT/ML injection Inject 20 Units into the skin at bedtime.  . [DISCONTINUED] dextrose 5 %-0.45 % sodium chloride infusion   . [DISCONTINUED] insulin regular (NOVOLIN R,HUMULIN R) 250 Units in sodium chloride 0.9 % 250 mL (1 Units/mL) infusion    No facility-administered encounter medications on file as of 09/14/2015.   ALLERGIES: No Known Allergies VACCINATION STATUS:  There is no immunization history on file for this patient.  Diabetes She presents for her initial diabetic visit. She has type 2 diabetes mellitus. Onset time: She was diagnosed at approximate age of 29 years. Her disease course has been worsening (She was seen in the emergency room with hyperglycemia and near DKA yesterday. ER doctor called on behalf of patient. And she was offered her walking visit today. Her last visit with me was more than 3 years ago.). There are no hypoglycemic associated symptoms. Pertinent negatives for hypoglycemia include no confusion, headaches, pallor or seizures. Associated symptoms include fatigue, polydipsia and polyuria. Pertinent negatives for diabetes include no chest pain and no polyphagia. There are no hypoglycemic complications. Symptoms are worsening. Diabetic complications include heart disease and nephropathy. Risk  factors for coronary artery disease include diabetes mellitus, dyslipidemia, sedentary lifestyle and tobacco exposure. Current diabetic treatment includes insulin injections. She is compliant with treatment none of the time (She was seen until October 2014 and then she disappeared from care. She mentions that she did not have insulin prior to her presentation to ER with severe hyperglycemia.). Her weight is decreasing rapidly. She is following a generally unhealthy diet. When asked about meal  planning, she reported none. She has not had a previous visit with a dietitian. She never (She has fracture of right lower extremity currently in cost.) participates in exercise. Home blood sugar record trend: She does not monitor blood glucose for lack of supplies. Her overall blood glucose range is >200 mg/dl. (She went to the ER with blood glucose of 400, left the hospital with blood glucose of 187. ) An ACE inhibitor/angiotensin II receptor blocker is being taken.  Hypertension This is a chronic problem. The current episode started more than 1 year ago. The problem is controlled. Pertinent negatives include no chest pain, headaches, palpitations or shortness of breath. Risk factors for coronary artery disease include dyslipidemia, diabetes mellitus, smoking/tobacco exposure and sedentary lifestyle. Past treatments include ACE inhibitors. Hypertensive end-organ damage includes kidney disease and CAD/MI.       Review of Systems  Constitutional: Positive for fatigue. Negative for fever, chills and unexpected weight change.  HENT: Negative for trouble swallowing and voice change.   Eyes: Negative for visual disturbance.  Respiratory: Negative for cough, shortness of breath and wheezing.   Cardiovascular: Negative for chest pain, palpitations and leg swelling.  Gastrointestinal: Negative for nausea, vomiting and diarrhea.  Endocrine: Positive for polydipsia and polyuria. Negative for cold intolerance, heat intolerance and polyphagia.  Musculoskeletal: Positive for gait problem. Negative for myalgias and arthralgias.       She has fracture of right lower extremity in the past.  Skin: Negative for color change, pallor, rash and wound.  Neurological: Negative for seizures and headaches.  Psychiatric/Behavioral: Negative for suicidal ideas and confusion.    Objective:    BP 136/83 mmHg  Pulse 103  Ht 5\' 4"  (1.626 m)  Wt 132 lb (59.875 kg)  BMI 22.65 kg/m2  Wt Readings from Last 3  Encounters:  09/14/15 132 lb (59.875 kg)  09/13/15 163 lb (73.936 kg)  09/05/15 163 lb (73.936 kg)    Physical Exam  Constitutional: She is oriented to person, place, and time. She appears well-developed.  HENT:  Head: Normocephalic and atraumatic.  Eyes: EOM are normal.  Neck: Normal range of motion. Neck supple. No tracheal deviation present. No thyromegaly present.  Cardiovascular: Normal rate and regular rhythm.   Pulmonary/Chest: Effort normal and breath sounds normal.  Abdominal: Soft. Bowel sounds are normal. There is no tenderness. There is no guarding.  Musculoskeletal: She exhibits no edema.  Right lower extremity in the past due to recent fracture.  Neurological: She is alert and oriented to person, place, and time. She has normal reflexes. No cranial nerve deficit. Coordination normal.  Skin: Skin is warm and dry. No rash noted. No erythema. No pallor.  Psychiatric: She has a normal mood and affect. Judgment normal.     CMP ( most recent) CMP     Component Value Date/Time   NA 138 09/14/2015 0655   K 3.0* 09/14/2015 0655   CL 110 09/14/2015 0655   CO2 21* 09/14/2015 0655   GLUCOSE 163* 09/14/2015 0655   BUN 7 09/14/2015 0655   CREATININE 0.48  09/14/2015 0655   CALCIUM 8.3* 09/14/2015 0655   PROT 7.5 09/13/2015 2350   ALBUMIN 4.2 09/13/2015 2350   AST 13* 09/13/2015 2350   ALT 13* 09/13/2015 2350   ALKPHOS 51 09/13/2015 2350   BILITOT 1.2 09/13/2015 2350   GFRNONAA >60 09/14/2015 0655   GFRAA >60 09/14/2015 0655     Diabetic Labs (most recent): Lab Results  Component Value Date   HGBA1C 12.5* 11/07/2012      Assessment & Plan:   1. Uncontrolled type 2 diabetes mellitus with Cardiac complication, with long-term current use of insulin ([place])  - Patient has currently uncontrolled symptomatic type 2 DM since  50 years of age,  with  No recent A1c however her last documented A1c was 12.5% in 2014.   Her diabetes is complicated by  coronary artery  disease, chronic kidney disease, noncompliance, and patient remains at a high risk for more acute and chronic complications of diabetes which include CAD, CVA, CKD, retinopathy, and neuropathy. These are all discussed in detail with the patient.  - I have counseled the patient on diet management by adopting a carbohydrate restricted/protein rich diet.  - Suggestion is made for patient to avoid simple carbohydrates   from their diet including Cakes , Desserts, Ice Cream,  Soda (  diet and regular) , Sweet Tea , Candies,  Chips, Cookies, Artificial Sweeteners,   and "Sugar-free" Products . This will help patient to have stable blood glucose profile and potentially avoid unintended weight gain.  - I encouraged the patient to switch to  unprocessed or minimally processed complex starch and increased protein intake (animal or plant source), fruits, and vegetables.  - Patient is advised to stick to a routine mealtimes to eat 3 meals  a day and avoid unnecessary snacks ( to snack only to correct hypoglycemia).  - The patient will be scheduled with Jearld Fenton, RDN, CDE for individualized DM education.  - Emphasizing the need for compliance, I have approached patient with the following individualized plan to manage diabetes and patient agrees:   - She mentions she has no insulin at home. I gave her samples of  Toujeo 20 units  QHS, associated with strict monitoring of glucose  AC and HS. -Based on her commitment, she would be considered for prandial insulin next visit in 1 week. She is advised to bring gain her meter and logs. I prescribed new insulin Toujeo  and strips for glucose monitoring. - Patient is warned not to take insulin without proper monitoring per orders. -Adjustment parameters are given for hypo and hyperglycemia in writing. -Patient is encouraged to call clinic for blood glucose levels less than 70 or above 200 mg /dl. -If she continues to have  Favorable renal function, she will be  considered for additional oral medications.  -She is not a candidate for SGLT 2 inhibitors,  incretin therapy. - Patient specific target  A1c;  LDL, HDL, Triglycerides, and  Waist Circumference were discussed in detail.  2) BP/HTN:controlled. Continue current medications including ACEI/ARB. 3) Lipids/HPL:lipid panel unknown, she is not on statins. I will obtain lipid panel on subsequent visits.  4)  Weight/Diet: CDE Consult will be initiated , exercise, and detailed carbohydrates information provided.  5) Chronic Care/Health Maintenance:  -Patient is on ACEI/ARB and encouraged to continue to follow up with Ophthalmology, Podiatrist at least yearly or according to recommendations, and advised to   quit stay away from smoking. I have recommended yearly flu vaccine and pneumonia vaccination at least  every 5 years; moderate intensity exercise for up to 150 minutes weekly; and  sleep for at least 7 hours a day.  - 60 minutes of time was spent on the care of this patient , 50% of which was applied for counseling on diabetes complications and their preventions.  - Patient to bring meter and  blood glucose logs during their next visit.   - I advised patient to maintain close follow up with Purvis Kilts, MD for primary care needs.  Follow up plan: - Return in about 1 week (around 09/21/2015) for follow up with meter and logs- no labs.  Glade Lloyd, MD Phone: [phone]  Fax: [phone]   09/14/2015, 12:04 PM

## 2015-09-14 NOTE — ED Notes (Signed)
PT c/o nausea with vomiting x1 at this time. New orders for zofran and ativan received at this time.

## 2015-09-14 NOTE — Discharge Instructions (Signed)
Go directly to see the endocrinologist at [facility]. They are expecting you. Phone number [phone]. Make sure you take your oral potassium as directed.  Blood Glucose Monitoring, Adult Monitoring your blood glucose (also know as blood sugar) helps you to manage your diabetes. It also helps you and your health care provider monitor your diabetes and determine how well your treatment plan is working. WHY SHOULD YOU MONITOR YOUR BLOOD GLUCOSE?  It can help you understand how food, exercise, and medicine affect your blood glucose.  It allows you to know what your blood glucose is at any given moment. You can quickly tell if you are having low blood glucose (hypoglycemia) or high blood glucose (hyperglycemia).  It can help you and your health care provider know how to adjust your medicines.  It can help you understand how to manage an illness or adjust medicine for exercise. WHEN SHOULD YOU TEST? Your health care provider will help you decide how often you should check your blood glucose. This may depend on the type of diabetes you have, your diabetes control, or the types of medicines you are taking. Be sure to write down all of your blood glucose readings so that this information can be reviewed with your health care provider. See below for examples of testing times that your health care provider may suggest. Type 1 Diabetes  Test at least 2 times per day if your diabetes is well controlled, if you are using an insulin pump, or if you perform multiple daily injections.  If your diabetes is not well controlled or if you are sick, you may need to test more often.  It is a good idea to also test:  Before every insulin injection.  Before and after exercise.  Between meals and 2 hours after a meal.  Occasionally between 2:00 a.m. and 3:00 a.m. Type 2 Diabetes  If you are taking insulin, test at least 2 times per day. However, it is best to test before every insulin  injection.  If you take medicines by mouth (orally), test 2 times a day.  If you are on a controlled diet, test once a day.  If your diabetes is not well controlled or if you are sick, you may need to monitor more often. HOW TO MONITOR YOUR BLOOD GLUCOSE Supplies Needed  Blood glucose meter.  Test strips for your meter. Each meter has its own strips. You must use the strips that go with your own meter.  A pricking needle (lancet).  A device that holds the lancet (lancing device).  A journal or log book to write down your results. Procedure  Wash your hands with soap and water. Alcohol is not preferred.  Prick the side of your finger (not the tip) with the lancet.  Gently milk the finger until a small drop of blood appears.  Follow the instructions that come with your meter for inserting the test strip, applying blood to the strip, and using your blood glucose meter. Other Areas to Get Blood for Testing Some meters allow you to use other areas of your body (other than your finger) to test your blood. These areas are called alternative sites. The most common alternative sites are:  The forearm.  The thigh.  The back area of the lower leg.  The palm of the hand. The blood flow in these areas is slower. Therefore, the blood glucose values you get may be delayed, and the numbers are different from what you would get  from your fingers. Do not use alternative sites if you think you are having hypoglycemia. Your reading will not be accurate. Always use a finger if you are having hypoglycemia. Also, if you cannot feel your lows (hypoglycemia unawareness), always use your fingers for your blood glucose checks. ADDITIONAL TIPS FOR GLUCOSE MONITORING  Do not reuse lancets.  Always carry your supplies with you.  All blood glucose meters have a 24-hour "hotline" number to call if you have questions or need help.  Adjust (calibrate) your blood glucose meter with a control solution  after finishing a few boxes of strips. BLOOD GLUCOSE RECORD KEEPING It is a good idea to keep a daily record or log of your blood glucose readings. Most glucose meters, if not all, keep your glucose records stored in the meter. Some meters come with the ability to download your records to your home computer. Keeping a record of your blood glucose readings is especially helpful if you are wanting to look for patterns. Make notes to go along with the blood glucose readings because you might forget what happened at that exact time. Keeping good records helps you and your health care provider to work together to achieve good diabetes management.    This information is not intended to replace advice given to you by your health care provider. Make sure you discuss any questions you have with your health care provider.   Document Released: 03/03/2003 Document Revised: 03/21/2014 Document Reviewed: 07/23/2012 Elsevier Interactive Patient Education 2016 [employer].  Diabetic Ketoacidosis Diabetic ketoacidosis is a life-threatening complication of diabetes. If it is not treated, it can cause severe dehydration and organ damage and can lead to a coma or death. CAUSES This condition develops when there is not enough of the hormone insulin in the body. Insulin helps the body to break down sugar for energy. Without insulin, the body cannot break down sugar, so it breaks down fats instead. This leads to the production of acids that are called ketones. Ketones are poisonous at high levels. This condition can be triggered by:  Stress on the body that is brought on by an illness.  Medicines that raise blood glucose levels.  Not taking diabetes medicine. SYMPTOMS Symptoms of this condition include:  Fatigue.  Weight loss.  Excessive thirst.  Light-headedness.  Fruity or sweet-smelling breath.  Excessive urination.  Vision changes.  Confusion or irritability.  Nausea.  Vomiting.  Rapid  breathing.  Abdominal pain.  Feeling flushed. DIAGNOSIS This condition is diagnosed based on a medical history, a physical exam, and blood tests. You may also have a urine test that checks for ketones. TREATMENT This condition may be treated with:  Fluid replacement. This may be done to correct dehydration.  Insulin injections. These may be given through the skin or through an IV tube.  Electrolyte replacement. Electrolytes, such as potassium and sodium, may be given in pill form or through an IV tube.  Antibiotic medicines. These may be prescribed if your condition was caused by an infection. HOME CARE INSTRUCTIONS Eating and Drinking  Drink enough fluids to keep your urine clear or pale yellow.  If you cannot eat, alternate between drinking fluids with sugar (such as juice) and salty fluids (such as broth or bouillon).  If you can eat, follow your usual diet and drink sugar-free liquids, such as water. Other Instructions  Take insulin as directed by your health care provider. Do not skip insulin injections. Do not use expired insulin.  If your blood sugar  is over 240 mg/dL, monitor your urine ketones every 4-6 hours.  If you were prescribed an antibiotic medicine, finish all of it even if you start to feel better.  Rest and exercise only as directed by your health care provider.  If you get sick, call your health care provider and begin treatment quickly. Your body often needs extra insulin to fight an illness.  Check your blood glucose levels regularly. If your blood glucose is high, drink plenty of fluids. This helps to flush out ketones. SEEK MEDICAL CARE IF:  Your blood glucose level is too high or too low.  You have ketones in your urine.  You have a fever.  You cannot eat.  You cannot tolerate fluids.  You have been vomiting for more than 2 hours.  You continue to have symptoms of this condition.  You develop new symptoms. SEEK IMMEDIATE MEDICAL CARE  IF:  Your blood glucose levels continue to be high (elevated).  Your monitor reads "high" even when you are taking insulin.  You faint.  You have chest pain.  You have trouble breathing.  You have a sudden, severe headache.  You have sudden weakness in one arm or one leg.  You have sudden trouble speaking or swallowing.  You have vomiting or diarrhea that gets worse after 3 hours.  You feel severely fatigued.  You have trouble thinking.  You have abdominal pain.  You are severely dehydrated. Symptoms of severe dehydration include:  Extreme thirst.  Dry mouth.  Blue lips.  Cold hands and feet.  Rapid breathing.   This information is not intended to replace advice given to you by your health care provider. Make sure you discuss any questions you have with your health care provider.   Document Released: 02/26/2000 Document Revised: 07/15/2014 Document Reviewed: 02/05/2014 Elsevier Interactive Patient Education [employer].

## 2015-09-14 NOTE — ED Notes (Signed)
PT verbalized understanding upon d/c from ED she is to go to Dr. Dorris Fetch office this morning.

## 2015-09-14 NOTE — ED Notes (Signed)
Verbal order to d/c insulin and potassium 38meq po.

## 2015-09-16 ENCOUNTER — Emergency Department (HOSPITAL_COMMUNITY): Payer: BLUE CROSS/BLUE SHIELD

## 2015-09-16 ENCOUNTER — Encounter (HOSPITAL_COMMUNITY): Payer: Self-pay | Admitting: Emergency Medicine

## 2015-09-16 ENCOUNTER — Other Ambulatory Visit: Payer: Self-pay

## 2015-09-16 ENCOUNTER — Emergency Department (HOSPITAL_COMMUNITY)
Admission: EM | Admit: 2015-09-16 | Discharge: 2015-09-16 | Disposition: A | Discharge status: Home / Self Care | Payer: BLUE CROSS/BLUE SHIELD | Attending: Emergency Medicine | Admitting: Emergency Medicine

## 2015-09-16 DIAGNOSIS — R358 Other polyuria: Secondary | ICD-10-CM | POA: Diagnosis not present

## 2015-09-16 DIAGNOSIS — R739 Hyperglycemia, unspecified: Secondary | ICD-10-CM

## 2015-09-16 DIAGNOSIS — R51 Headache: Secondary | ICD-10-CM | POA: Diagnosis not present

## 2015-09-16 DIAGNOSIS — E1151 Type 2 diabetes mellitus with diabetic peripheral angiopathy without gangrene: Secondary | ICD-10-CM | POA: Diagnosis not present

## 2015-09-16 DIAGNOSIS — I10 Essential (primary) hypertension: Secondary | ICD-10-CM | POA: Insufficient documentation

## 2015-09-16 DIAGNOSIS — F17210 Nicotine dependence, cigarettes, uncomplicated: Secondary | ICD-10-CM | POA: Insufficient documentation

## 2015-09-16 DIAGNOSIS — Z794 Long term (current) use of insulin: Secondary | ICD-10-CM | POA: Insufficient documentation

## 2015-09-16 DIAGNOSIS — E1165 Type 2 diabetes mellitus with hyperglycemia: Secondary | ICD-10-CM | POA: Insufficient documentation

## 2015-09-16 DIAGNOSIS — Z7982 Long term (current) use of aspirin: Secondary | ICD-10-CM | POA: Insufficient documentation

## 2015-09-16 DIAGNOSIS — J45909 Unspecified asthma, uncomplicated: Secondary | ICD-10-CM | POA: Diagnosis not present

## 2015-09-16 DIAGNOSIS — Z79899 Other long term (current) drug therapy: Secondary | ICD-10-CM | POA: Diagnosis not present

## 2015-09-16 LAB — CBG MONITORING, ED
GLUCOSE-CAPILLARY: 394 mg/dL — AB (ref 65–99)
GLUCOSE-CAPILLARY: 148 mg/dL — AB (ref 65–99)
GLUCOSE-CAPILLARY: 208 mg/dL — AB (ref 65–99)
GLUCOSE-CAPILLARY: 220 mg/dL — AB (ref 65–99)
Glucose-Capillary: 532 mg/dL (ref 65–99)

## 2015-09-16 LAB — CBC
HCT: 41.3 % (ref 36.0–46.0)
Hemoglobin: 14.7 g/dL (ref 12.0–15.0)
MCH: 33.7 pg (ref 26.0–34.0)
MCHC: 35.6 g/dL (ref 30.0–36.0)
MCV: 94.7 fL (ref 78.0–100.0)
PLATELETS: 195 10*3/uL (ref 150–400)
RBC: 4.36 MIL/uL (ref 3.87–5.11)
RDW: 12.6 % (ref 11.5–15.5)
WBC: 7.9 10*3/uL (ref 4.0–10.5)

## 2015-09-16 LAB — URINALYSIS, ROUTINE W REFLEX MICROSCOPIC
Bilirubin Urine: NEGATIVE
Glucose, UA: >1000 mg/dL — AB
HGB URINE DIPSTICK: NEGATIVE
KETONES UR: NEGATIVE mg/dL
Leukocytes, UA: NEGATIVE
Nitrite: NEGATIVE
PROTEIN: NEGATIVE mg/dL
Specific Gravity, Urine: <1.005 — ABNORMAL LOW (ref 1.005–1.030)
pH: 5 (ref 5.0–8.0)

## 2015-09-16 LAB — BASIC METABOLIC PANEL
Anion gap: 9 (ref 5–15)
BUN: 9 mg/dL (ref 6–20)
CALCIUM: 9.5 mg/dL (ref 8.9–10.3)
CO2: 23 mmol/L (ref 22–32)
CREATININE: 0.64 mg/dL (ref 0.44–1.00)
Chloride: 103 mmol/L (ref 101–111)
GFR calc non Af Amer: >60 mL/min (ref >60)
Glucose, Bld: 425 mg/dL — ABNORMAL HIGH (ref 65–99)
Potassium: 3.3 mmol/L — ABNORMAL LOW (ref 3.5–5.1)
SODIUM: 135 mmol/L (ref 135–145)

## 2015-09-16 LAB — URINE MICROSCOPIC-ADD ON: RBC / HPF: NONE SEEN RBC/hpf (ref 0–5)

## 2015-09-16 MED ORDER — INSULIN PEN NEEDLE 31G X 5 MM MISC
Status: DC
Start: 2015-09-16 — End: 2015-09-17

## 2015-09-16 MED ORDER — INSULIN ASPART 100 UNIT/ML ~~LOC~~ SOLN
8.0000 [IU] | Freq: Once | SUBCUTANEOUS | Status: AC
  Administered 2015-09-16: 8 [IU] via INTRAVENOUS
  Filled 2015-09-16: qty 1

## 2015-09-16 MED ORDER — INSULIN GLARGINE 300 UNIT/ML ~~LOC~~ SOPN: 30.0000 [IU] | PEN_INJECTOR | Freq: Every day | SUBCUTANEOUS | Status: DC

## 2015-09-16 MED ORDER — ONDANSETRON HCL 4 MG/2ML IJ SOLN
4.0000 mg | Freq: Once | INTRAMUSCULAR | Status: AC
  Administered 2015-09-16: 4 mg via INTRAVENOUS
  Filled 2015-09-16: qty 2

## 2015-09-16 MED ORDER — SODIUM CHLORIDE 0.9 % IV BOLUS (SEPSIS)
500.0000 mL | Freq: Once | INTRAVENOUS | Status: AC
  Administered 2015-09-16: 500 mL via INTRAVENOUS

## 2015-09-16 MED ORDER — KETOROLAC TROMETHAMINE 30 MG/ML IJ SOLN
30.0000 mg | Freq: Once | INTRAMUSCULAR | Status: AC
  Administered 2015-09-16: 30 mg via INTRAVENOUS
  Filled 2015-09-16: qty 1

## 2015-09-16 MED ORDER — ONDANSETRON HCL 4 MG/2ML IJ SOLN
INTRAMUSCULAR | Status: AC
  Administered 2015-09-16: 4 mg via INTRAVENOUS
  Filled 2015-09-16: qty 2

## 2015-09-16 MED ORDER — SODIUM CHLORIDE 0.9 % IV BOLUS (SEPSIS)
1000.0000 mL | Freq: Once | INTRAVENOUS | Status: AC
Start: 2015-09-16 — End: 2015-09-16
  Administered 2015-09-16: 1000 mL via INTRAVENOUS

## 2015-09-16 NOTE — ED Notes (Signed)
Pt stated understanding of medications, discharge instructions, and follow up care. Verbalized she would continue to monitor her blood sugar at home and make the insulin change.

## 2015-09-16 NOTE — ED Provider Notes (Signed)
CSN: [id number]     Arrival date & time 09/16/15  1254 History   First MD Initiated Contact with Patient 09/16/15 1612     Chief Complaint  Patient presents with  . Hyperglycemia     Patient is a 50 y.o. female presenting with hyperglycemia.  Hyperglycemia Associated symptoms: nausea and polyuria   Associated symptoms: no abdominal pain, no chest pain, no shortness of breath, no vomiting and no weakness   Patient presents with high blood sugar. Seen a few days ago for same and then seen by endocrinology for the first time 2 days ago. Started on nighttime insulin. States she took the insulin and her sugar was around 270 last night. Was 400s morning. States she has a headache and feels like her heart is going little fast. She states she's been eating well the last couple days. Reviewing notes it looks as if the plan was to follow-up for possible daytime insulin. No fevers or chills. Some urinary frequency.  Past Medical History  Diagnosis Date  . Asthma   . Diabetes mellitus   . Hypertension   . PVD (peripheral vascular disease) ([place])   . DDD (degenerative disc disease), lumbar    Past Surgical History  Procedure Laterality Date  . Femoral artery stent  right leg  . Back surgery    . Coronary angioplasty with stent placement    . Coronary artery bypass graft     Family History  Problem Relation Age of Onset  . Stroke Mother   . Hypertension Mother   . Heart failure Father    Social History  Substance Use Topics  . Smoking status: Current Every Day Smoker -- 0.50 packs/day for 30 years    Types: Cigarettes  . Smokeless tobacco: Never Used  . Alcohol Use: No   OB History    Gravida Para Term Preterm AB TAB SAB Ectopic Multiple Living   2 1  1 1  1   1      Review of Systems  Constitutional: Negative for activity change and appetite change.  Eyes: Negative for pain.  Respiratory: Negative for chest tightness and shortness of breath.   Cardiovascular: Negative for chest  pain and leg swelling.  Gastrointestinal: Positive for nausea. Negative for vomiting, abdominal pain and diarrhea.  Endocrine: Positive for polyuria.  Genitourinary: Negative for flank pain.  Musculoskeletal: Negative for back pain and neck stiffness.  Skin: Negative for rash.  Neurological: Positive for headaches. Negative for weakness and numbness.  Psychiatric/Behavioral: Negative for behavioral problems. The patient is nervous/anxious.       Allergies  Review of patient's allergies indicates no known allergies.  Home Medications   Prior to Admission medications   Medication Sig Start Date End Date Taking? Authorizing Provider  aspirin EC 81 MG tablet Take 81 mg by mouth every morning.    Yes Historical Provider, MD  budesonide-formoterol (SYMBICORT) 160-4.5 MCG/ACT inhaler Inhale 2 puffs into the lungs 2 (two) times daily.   Yes Historical Provider, MD  Carboxymethylcellul-Glycerin (CLEAR EYES FOR DRY EYES OP) Place 2 drops into both eyes 2 (two) times daily.   Yes Historical Provider, MD  esomeprazole (NEXIUM) 20 MG capsule Take 20 mg by mouth daily at 12 noon.   Yes Historical Provider, MD  GARLIC PO Take 1 tablet by mouth daily.   Yes Historical Provider, MD  lisinopril (PRINIVIL,ZESTRIL) 20 MG tablet Take 20 mg by mouth daily.   Yes Historical Provider, MD  Multiple Vitamin (MULTIVITAMIN WITH MINERALS) TABS  tablet Take 1 tablet by mouth daily.   Yes Historical Provider, MD  ondansetron (ZOFRAN ODT) 4 MG disintegrating tablet Take 1 tablet (4 mg total) by mouth every 8 (eight) hours as needed for nausea or vomiting. 09/14/15  Yes Kristen N Ward, DO  glucose blood (ONE TOUCH TEST STRIPS) test strip Use as instructed 09/14/15   Cassandria Anger, MD  Insulin Glargine (TOUJEO SOLOSTAR) 300 UNIT/ML SOPN Inject 30 Units into the skin at bedtime. 09/16/15   Davonna Belling, MD  Insulin Pen Needle (B-D UF III MINI PEN NEEDLES) 31G X 5 MM MISC Use qhs 09/16/15   Cassandria Anger, MD   potassium chloride SA (K-DUR,KLOR-CON) 20 MEQ tablet Take 2 tablets (40 mEq total) by mouth daily. 09/14/15 09/15/15  [name], MD   BP 165/80 mmHg  Pulse 82  Temp(Src) 98.7 F (37.1 C) (Oral)  Resp 16  Ht 5\' 4"  (1.626 m)  Wt 163 lb (73.936 kg)  BMI 27.97 kg/m2  SpO2 100% Physical Exam  Constitutional: She is oriented to person, place, and time. She appears well-developed and well-nourished.  HENT:  Head: Normocephalic and atraumatic.  Eyes: Pupils are equal, round, and reactive to light.  Neck: Normal range of motion.  Cardiovascular: Regular rhythm and normal heart sounds.   No murmur heard. Mild tachycardia  Pulmonary/Chest: Effort normal and breath sounds normal. No respiratory distress. She has no wheezes. She has no rales.  Abdominal: Soft. Bowel sounds are normal. She exhibits no distension. There is no tenderness. There is no rebound.  Musculoskeletal: Normal range of motion.  Neurological: She is alert and oriented to person, place, and time.  Skin: Skin is warm and dry.  Psychiatric: She has a normal mood and affect. Her speech is normal.  Nursing note and vitals reviewed.   ED Course  Procedures (including critical care time) Labs Review Labs Reviewed  BASIC METABOLIC PANEL - Abnormal; Notable for the following:    Potassium 3.3 (*)    Glucose, Bld 425 (*)    All other components within normal limits  URINALYSIS, ROUTINE W REFLEX MICROSCOPIC (NOT AT [facility]) - Abnormal; Notable for the following:    Specific Gravity, Urine <1.005 (*)    Glucose, UA >1000 (*)    All other components within normal limits  URINE MICROSCOPIC-ADD ON - Abnormal; Notable for the following:    Squamous Epithelial / LPF 0-5 (*)    Bacteria, UA RARE (*)    All other components within normal limits  CBG MONITORING, ED - Abnormal; Notable for the following:    Glucose-Capillary 394 (*)    All other components within normal limits  CBG MONITORING, ED - Abnormal; Notable for the  following:    Glucose-Capillary 532 (*)    All other components within normal limits  CBG MONITORING, ED - Abnormal; Notable for the following:    Glucose-Capillary 148 (*)    All other components within normal limits  CBG MONITORING, ED - Abnormal; Notable for the following:    Glucose-Capillary 208 (*)    All other components within normal limits  CBG MONITORING, ED - Abnormal; Notable for the following:    Glucose-Capillary 220 (*)    All other components within normal limits  CBC    Imaging Review Ct Head Wo Contrast  09/16/2015  CLINICAL DATA:  Head injury, struck head on a door today. Headache occult it. EXAM: CT HEAD WITHOUT CONTRAST TECHNIQUE: Contiguous axial images were obtained from the base of the skull  through the vertex without intravenous contrast. COMPARISON:  None. FINDINGS: Brain: No intracranial hemorrhage, mass effect, or midline shift. No hydrocephalus. The basilar cisterns are patent. No evidence of territorial infarct. Minimal chronic small vessel ischemia. No intracranial fluid collection. Vascular: No hyperdense vessel or abnormal calcification. Atherosclerosis of skullbase vasculature. Skull:  Calvarium is intact. Sinuses/Orbits: Included paranasal sinuses and mastoid air cells are well aerated. Other: Small left occipital scalp density, may be a sebaceous cyst or sequela of injury. IMPRESSION: No acute intracranial abnormality. Electronically Signed   By: Jeb Levering M.D.   On: 09/16/2015 20:29   I have personally reviewed and evaluated these images and lab results as part of my medical decision-making.   EKG Interpretation None      MDM   Final diagnoses:  Hyperglycemia    Patient presents with hyperglycemia. Sugars have decreased. Seen 2 days ago by endocrinology. Started on different insulin. Initially just started on nighttime insulin. Discussed with Dr. Dorris Fetch. Will increase that insulin to 30 units from 20 units. Also would like to start short-acting  at 5 units with meals, however she does not have this at home. I will have her follow-up in the office to get the insulin. Her sugar has come down from 500 down to around 200. This point I think it is okay for her to go home but believes with various factors she is at risk for  becoming elevated again but I did not see criteria to admit her right now.  Davonna Belling, MD 09/16/15 2129

## 2015-09-16 NOTE — ED Notes (Signed)
POC CBG 148.

## 2015-09-16 NOTE — ED Notes (Signed)
Patient states her sugar was "almost 400" this morning. Complaining of headache and "nervousness" at triage. States she took her levemir last night but takes no medications in the morning for DM.

## 2015-09-16 NOTE — Discharge Instructions (Signed)
Increase your nighttime insulin to 30 units. Follow-up with Dr. Dorris Fetch to get a short acting insulin to take with meals.  Hyperglycemia Hyperglycemia occurs when the glucose (sugar) in your blood is too high. Hyperglycemia can happen for many reasons, but it most often happens to people who do not know they have diabetes or are not managing their diabetes properly.  CAUSES  Whether you have diabetes or not, there are other causes of hyperglycemia. Hyperglycemia can occur when you have diabetes, but it can also occur in other situations that you might not be as aware of, such as: Diabetes  If you have diabetes and are having problems controlling your blood glucose, hyperglycemia could occur because of some of the following reasons:  Not following your meal plan.  Not taking your diabetes medications or not taking it properly.  Exercising less or doing less activity than you normally do.  Being sick. Pre-diabetes  This cannot be ignored. Before people develop Type 2 diabetes, they almost always have "pre-diabetes." This is when your blood glucose levels are higher than normal, but not yet high enough to be diagnosed as diabetes. Research has shown that some long-term damage to the body, especially the heart and circulatory system, may already be occurring during pre-diabetes. If you take action to manage your blood glucose when you have pre-diabetes, you may delay or prevent Type 2 diabetes from developing. Stress  If you have diabetes, you may be "diet" controlled or on oral medications or insulin to control your diabetes. However, you may find that your blood glucose is higher than usual in the hospital whether you have diabetes or not. This is often referred to as "stress hyperglycemia." Stress can elevate your blood glucose. This happens because of hormones put out by the body during times of stress. If stress has been the cause of your high blood glucose, it can be followed regularly by your  caregiver. That way he/she can make sure your hyperglycemia does not continue to get worse or progress to diabetes. Steroids  Steroids are medications that act on the infection fighting system (immune system) to block inflammation or infection. One side effect can be a rise in blood glucose. Most people can produce enough extra insulin to allow for this rise, but for those who cannot, steroids make blood glucose levels go even higher. It is not unusual for steroid treatments to "uncover" diabetes that is developing. It is not always possible to determine if the hyperglycemia will go away after the steroids are stopped. A special blood test called an A1c is sometimes done to determine if your blood glucose was elevated before the steroids were started. SYMPTOMS  Thirsty.  Frequent urination.  Dry mouth.  Blurred vision.  Tired or fatigue.  Weakness.  Sleepy.  Tingling in feet or leg. DIAGNOSIS  Diagnosis is made by monitoring blood glucose in one or all of the following ways:  A1c test. This is a chemical found in your blood.  Fingerstick blood glucose monitoring.  Laboratory results. TREATMENT  First, knowing the cause of the hyperglycemia is important before the hyperglycemia can be treated. Treatment may include, but is not be limited to:  Education.  Change or adjustment in medications.  Change or adjustment in meal plan.  Treatment for an illness, infection, etc.  More frequent blood glucose monitoring.  Change in exercise plan.  Decreasing or stopping steroids.  Lifestyle changes. HOME CARE INSTRUCTIONS   Test your blood glucose as directed.  Exercise regularly. Your  caregiver will give you instructions about exercise. Pre-diabetes or diabetes which comes on with stress is helped by exercising.  Eat wholesome, balanced meals. Eat often and at regular, fixed times. Your caregiver or nutritionist will give you a meal plan to guide your sugar intake.  Being at  an ideal weight is important. If needed, losing as little as 10 to 15 pounds may help improve blood glucose levels. SEEK MEDICAL CARE IF:   You have questions about medicine, activity, or diet.  You continue to have symptoms (problems such as increased thirst, urination, or weight gain). SEEK IMMEDIATE MEDICAL CARE IF:   You are vomiting or have diarrhea.  Your breath smells fruity.  You are breathing faster or slower.  You are very sleepy or incoherent.  You have numbness, tingling, or pain in your feet or hands.  You have chest pain.  Your symptoms get worse even though you have been following your caregiver's orders.  If you have any other questions or concerns.   This information is not intended to replace advice given to you by your health care provider. Make sure you discuss any questions you have with your health care provider.   Document Released: 08/24/2000 Document Revised: 05/23/2011 Document Reviewed: 11/04/2014 Elsevier Interactive Patient Education [employer].

## 2015-09-16 NOTE — ED Notes (Signed)
Attempted to obtain IV access x3. No success noted at this time.

## 2015-09-17 ENCOUNTER — Emergency Department (HOSPITAL_COMMUNITY)
Admission: EM | Admit: 2015-09-17 | Discharge: 2015-09-17 | Disposition: A | Discharge status: Home / Self Care | Payer: BLUE CROSS/BLUE SHIELD | Attending: Emergency Medicine | Admitting: Emergency Medicine

## 2015-09-17 ENCOUNTER — Encounter (HOSPITAL_COMMUNITY): Payer: Self-pay | Admitting: Emergency Medicine

## 2015-09-17 ENCOUNTER — Other Ambulatory Visit: Payer: Self-pay

## 2015-09-17 DIAGNOSIS — Z7982 Long term (current) use of aspirin: Secondary | ICD-10-CM | POA: Diagnosis not present

## 2015-09-17 DIAGNOSIS — F17210 Nicotine dependence, cigarettes, uncomplicated: Secondary | ICD-10-CM | POA: Diagnosis not present

## 2015-09-17 DIAGNOSIS — R7309 Other abnormal glucose: Secondary | ICD-10-CM

## 2015-09-17 DIAGNOSIS — J45909 Unspecified asthma, uncomplicated: Secondary | ICD-10-CM | POA: Diagnosis not present

## 2015-09-17 DIAGNOSIS — I10 Essential (primary) hypertension: Secondary | ICD-10-CM | POA: Diagnosis not present

## 2015-09-17 DIAGNOSIS — Z79899 Other long term (current) drug therapy: Secondary | ICD-10-CM | POA: Insufficient documentation

## 2015-09-17 DIAGNOSIS — Z794 Long term (current) use of insulin: Secondary | ICD-10-CM | POA: Insufficient documentation

## 2015-09-17 DIAGNOSIS — I739 Peripheral vascular disease, unspecified: Secondary | ICD-10-CM | POA: Insufficient documentation

## 2015-09-17 DIAGNOSIS — E1165 Type 2 diabetes mellitus with hyperglycemia: Secondary | ICD-10-CM | POA: Insufficient documentation

## 2015-09-17 LAB — URINALYSIS, ROUTINE W REFLEX MICROSCOPIC
Bilirubin Urine: NEGATIVE
HGB URINE DIPSTICK: NEGATIVE
Ketones, ur: NEGATIVE mg/dL
LEUKOCYTES UA: NEGATIVE
Nitrite: NEGATIVE
PROTEIN: NEGATIVE mg/dL
pH: 5 (ref 5.0–8.0)

## 2015-09-17 LAB — CBC WITH DIFFERENTIAL/PLATELET
BASOS ABS: 0 10*3/uL (ref 0.0–0.1)
Basophils Relative: 0 %
Eosinophils Absolute: 0.1 10*3/uL (ref 0.0–0.7)
Eosinophils Relative: 1 %
HCT: 42.6 % (ref 36.0–46.0)
HEMOGLOBIN: 15 g/dL (ref 12.0–15.0)
LYMPHS ABS: 0.9 10*3/uL (ref 0.7–4.0)
LYMPHS PCT: 6 %
MCH: 33.7 pg (ref 26.0–34.0)
MCHC: 35.2 g/dL (ref 30.0–36.0)
MCV: 95.7 fL (ref 78.0–100.0)
Monocytes Absolute: 0.5 10*3/uL (ref 0.1–1.0)
Monocytes Relative: 3 %
NEUTROS PCT: 90 %
Neutro Abs: 13.5 10*3/uL — ABNORMAL HIGH (ref 1.7–7.7)
Platelets: 181 10*3/uL (ref 150–400)
RBC: 4.45 MIL/uL (ref 3.87–5.11)
RDW: 12.8 % (ref 11.5–15.5)
WBC: 15.1 10*3/uL — AB (ref 4.0–10.5)

## 2015-09-17 LAB — COMPREHENSIVE METABOLIC PANEL
ALT: 17 U/L (ref 14–54)
ANION GAP: 9 (ref 5–15)
AST: 32 U/L (ref 15–41)
Albumin: 3.8 g/dL (ref 3.5–5.0)
Alkaline Phosphatase: 51 U/L (ref 38–126)
BUN: <5 mg/dL — ABNORMAL LOW (ref 6–20)
CHLORIDE: 106 mmol/L (ref 101–111)
CO2: 24 mmol/L (ref 22–32)
Calcium: 8.9 mg/dL (ref 8.9–10.3)
Creatinine, Ser: 0.74 mg/dL (ref 0.44–1.00)
Glucose, Bld: 356 mg/dL — ABNORMAL HIGH (ref 65–99)
POTASSIUM: 3.1 mmol/L — AB (ref 3.5–5.1)
Sodium: 139 mmol/L (ref 135–145)
TOTAL PROTEIN: 6.9 g/dL (ref 6.5–8.1)
Total Bilirubin: 0.8 mg/dL (ref 0.3–1.2)

## 2015-09-17 LAB — URINE MICROSCOPIC-ADD ON
BACTERIA UA: NONE SEEN
SQUAMOUS EPITHELIAL / LPF: NONE SEEN
WBC, UA: NONE SEEN WBC/hpf (ref 0–5)

## 2015-09-17 LAB — CBG MONITORING, ED
GLUCOSE-CAPILLARY: 319 mg/dL — AB (ref 65–99)
GLUCOSE-CAPILLARY: 386 mg/dL — AB (ref 65–99)
GLUCOSE-CAPILLARY: 222 mg/dL — AB (ref 65–99)
Glucose-Capillary: 204 mg/dL — ABNORMAL HIGH (ref 65–99)

## 2015-09-17 MED ORDER — SODIUM CHLORIDE 0.9 % IV BOLUS (SEPSIS)
1000.0000 mL | Freq: Once | INTRAVENOUS | Status: AC
  Administered 2015-09-17: 1000 mL via INTRAVENOUS

## 2015-09-17 MED ORDER — POTASSIUM CHLORIDE CRYS ER 20 MEQ PO TBCR
40.0000 meq | EXTENDED_RELEASE_TABLET | Freq: Once | ORAL | Status: AC
  Administered 2015-09-17: 40 meq via ORAL
  Filled 2015-09-17: qty 2

## 2015-09-17 MED ORDER — INSULIN ASPART 100 UNIT/ML ~~LOC~~ SOLN
5.0000 [IU] | Freq: Once | SUBCUTANEOUS | Status: AC
  Administered 2015-09-17: 5 [IU] via SUBCUTANEOUS
  Filled 2015-09-17: qty 1

## 2015-09-17 MED ORDER — INSULIN PEN NEEDLE 31G X 5 MM MISC: Status: DC

## 2015-09-17 MED ORDER — INSULIN ASPART 100 UNIT/ML ~~LOC~~ SOLN
10.0000 [IU] | Freq: Once | SUBCUTANEOUS | Status: AC
  Administered 2015-09-17: 10 [IU] via SUBCUTANEOUS
  Filled 2015-09-17: qty 1

## 2015-09-17 MED ORDER — SODIUM CHLORIDE 0.9 % IV BOLUS (SEPSIS): 1000.0000 mL | Freq: Once | INTRAVENOUS | Status: DC

## 2015-09-17 NOTE — ED Provider Notes (Signed)
CSN: [id number]     Arrival date & time 09/17/15  0722 History   First MD Initiated Contact with Patient 09/17/15 [phone]     Chief Complaint  Patient presents with  . Hyperglycemia     (Consider location/radiation/quality/duration/timing/severity/associated sxs/prior Treatment) Patient is a 50 y.o. female presenting with hyperglycemia. The history is provided by the patient (Patient states that her sugars elevated again. She feels weak).  Hyperglycemia Severity:  Moderate Onset quality:  Sudden Timing:  Constant Progression:  Worsening Chronicity:  Recurrent Diabetes status:  Controlled with insulin Associated symptoms: fatigue   Associated symptoms: no abdominal pain and no chest pain     Past Medical History  Diagnosis Date  . Asthma   . Diabetes mellitus   . Hypertension   . PVD (peripheral vascular disease) ([place])   . DDD (degenerative disc disease), lumbar    Past Surgical History  Procedure Laterality Date  . Femoral artery stent  right leg  . Back surgery    . Coronary angioplasty with stent placement    . Coronary artery bypass graft     Family History  Problem Relation Age of Onset  . Stroke Mother   . Hypertension Mother   . Heart failure Father    Social History  Substance Use Topics  . Smoking status: Current Every Day Smoker -- 0.50 packs/day for 30 years    Types: Cigarettes  . Smokeless tobacco: Never Used  . Alcohol Use: No   OB History    Gravida Para Term Preterm AB TAB SAB Ectopic Multiple Living   2 1  1 1  1   1      Review of Systems  Constitutional: Positive for fatigue. Negative for appetite change.  HENT: Negative for congestion, ear discharge and sinus pressure.   Eyes: Negative for discharge.  Respiratory: Negative for cough.   Cardiovascular: Negative for chest pain.  Gastrointestinal: Negative for abdominal pain and diarrhea.  Genitourinary: Negative for frequency and hematuria.  Musculoskeletal: Negative for back pain.  Skin:  Negative for rash.  Neurological: Negative for seizures and headaches.  Psychiatric/Behavioral: Negative for hallucinations.      Allergies  Review of patient's allergies indicates no known allergies.  Home Medications   Prior to Admission medications   Medication Sig Start Date End Date Taking? Authorizing Provider  aspirin EC 81 MG tablet Take 81 mg by mouth every morning.    Yes Historical Provider, MD  budesonide-formoterol (SYMBICORT) 160-4.5 MCG/ACT inhaler Inhale 2 puffs into the lungs 2 (two) times daily.   Yes Historical Provider, MD  Carboxymethylcellul-Glycerin (CLEAR EYES FOR DRY EYES OP) Place 2 drops into both eyes 2 (two) times daily.   Yes Historical Provider, MD  esomeprazole (NEXIUM) 20 MG capsule Take 20 mg by mouth daily at 12 noon.   Yes Historical Provider, MD  GARLIC PO Take 1 tablet by mouth daily.   Yes Historical Provider, MD  glucose blood (ONE TOUCH TEST STRIPS) test strip Use as instructed 09/14/15  Yes Cassandria Anger, MD  Insulin Detemir (LEVEMIR FLEXPEN) 100 UNIT/ML Pen Inject 30 Units into the skin at bedtime.   Yes Historical Provider, MD  Insulin Glargine (TOUJEO SOLOSTAR) 300 UNIT/ML SOPN Inject 30 Units into the skin at bedtime. Patient taking differently: Inject 5 Units into the skin 3 (three) times daily after meals.  09/16/15  Yes Davonna Belling, MD  lisinopril (PRINIVIL,ZESTRIL) 20 MG tablet Take 20 mg by mouth daily.   Yes Historical Provider, MD  Multiple  Vitamin (MULTIVITAMIN WITH MINERALS) TABS tablet Take 1 tablet by mouth daily.   Yes Historical Provider, MD  ondansetron (ZOFRAN ODT) 4 MG disintegrating tablet Take 1 tablet (4 mg total) by mouth every 8 (eight) hours as needed for nausea or vomiting. 09/14/15  Yes Kristen N Ward, DO  Insulin Pen Needle (B-D UF III MINI PEN NEEDLES) 31G X 5 MM MISC Use qhs 09/17/15   Cassandria Anger, MD  potassium chloride SA (K-DUR,KLOR-CON) 20 MEQ tablet Take 2 tablets (40 mEq total) by mouth  daily. Patient not taking: Reported on 09/17/2015 09/14/15 09/15/15  [name], MD   BP 152/72 mmHg  Pulse 85  Temp(Src) 98.1 F (36.7 C) (Oral)  Resp 16  Ht 5\' 4"  (1.626 m)  Wt 163 lb (73.936 kg)  BMI 27.97 kg/m2  SpO2 100% Physical Exam  Constitutional: She is oriented to person, place, and time. She appears well-developed.  HENT:  Head: Normocephalic.  Eyes: Conjunctivae and EOM are normal. No scleral icterus.  Neck: Neck supple. No thyromegaly present.  Cardiovascular: Normal rate and regular rhythm.  Exam reveals no gallop and no friction rub.   No murmur heard. Pulmonary/Chest: No stridor. She has no wheezes. She has no rales. She exhibits no tenderness.  Abdominal: She exhibits no distension. There is no tenderness. There is no rebound.  Musculoskeletal: Normal range of motion. She exhibits no edema.  Lymphadenopathy:    She has no cervical adenopathy.  Neurological: She is oriented to person, place, and time. She exhibits normal muscle tone. Coordination normal.  Skin: No rash noted. No erythema.  Psychiatric: She has a normal mood and affect. Her behavior is normal.    ED Course  Procedures (including critical care time) Labs Review Labs Reviewed  COMPREHENSIVE METABOLIC PANEL - Abnormal; Notable for the following:    Potassium 3.1 (*)    Glucose, Bld 356 (*)    BUN <5 (*)    All other components within normal limits  CBC WITH DIFFERENTIAL/PLATELET - Abnormal; Notable for the following:    WBC 15.1 (*)    Neutro Abs 13.5 (*)    All other components within normal limits  URINALYSIS, ROUTINE W REFLEX MICROSCOPIC (NOT AT [facility]) - Abnormal; Notable for the following:    Specific Gravity, Urine <1.005 (*)    Glucose, UA >1000 (*)    All other components within normal limits  CBG MONITORING, ED - Abnormal; Notable for the following:    Glucose-Capillary 319 (*)    All other components within normal limits  CBG MONITORING, ED - Abnormal; Notable for the following:     Glucose-Capillary 386 (*)    All other components within normal limits  CBG MONITORING, ED - Abnormal; Notable for the following:    Glucose-Capillary 222 (*)    All other components within normal limits  CBG MONITORING, ED - Abnormal; Notable for the following:    Glucose-Capillary 204 (*)    All other components within normal limits  URINE MICROSCOPIC-ADD ON    Imaging Review Ct Head Wo Contrast  09/16/2015  CLINICAL DATA:  Head injury, struck head on a door today. Headache occult it. EXAM: CT HEAD WITHOUT CONTRAST TECHNIQUE: Contiguous axial images were obtained from the base of the skull through the vertex without intravenous contrast. COMPARISON:  None. FINDINGS: Brain: No intracranial hemorrhage, mass effect, or midline shift. No hydrocephalus. The basilar cisterns are patent. No evidence of territorial infarct. Minimal chronic small vessel ischemia. No intracranial fluid collection. Vascular: No  hyperdense vessel or abnormal calcification. Atherosclerosis of skullbase vasculature. Skull:  Calvarium is intact. Sinuses/Orbits: Included paranasal sinuses and mastoid air cells are well aerated. Other: Small left occipital scalp density, may be a sebaceous cyst or sequela of injury. IMPRESSION: No acute intracranial abnormality. Electronically Signed   By: Jeb Levering M.D.   On: 09/16/2015 20:29   I have personally reviewed and evaluated these images and lab results as part of my medical decision-making.   EKG Interpretation None      MDM   Final diagnoses:  Elevated glucose    Patient with poorly controlled sugar. Patient was seen yesterday in her long-term insulin was changed from 20-30 units a day and the added 5 units 3 times a day with meals. Patient with 30 units last night and stated her sugar was high this morning. Patient sugar was treated with fluids and insulin and came down. Patient told to continue the present regimen since it was only changed yesterday. But if her  sugar is greater than 300 she should take 10 units after her meal. She is to follow-up with her endocrinologist    Milton Ferguson, MD 09/17/15 1304

## 2015-09-17 NOTE — Discharge Instructions (Signed)
Contact your diabetic md this week and let them know you were here again

## 2015-09-17 NOTE — ED Notes (Addendum)
CBG- 222 

## 2015-09-17 NOTE — ED Notes (Signed)
Patient with multiple visits for hyperglycemia. States she is taking her insulin as prescribed. Has not followed up with Dr Dorris Fetch as instructed.

## 2015-09-27 ENCOUNTER — Emergency Department (HOSPITAL_COMMUNITY)
Admission: EM | Admit: 2015-09-27 | Discharge: 2015-09-27 | Disposition: A | Discharge status: Home / Self Care | Payer: BLUE CROSS/BLUE SHIELD | Attending: Emergency Medicine | Admitting: Emergency Medicine

## 2015-09-27 ENCOUNTER — Encounter (HOSPITAL_COMMUNITY): Payer: Self-pay | Admitting: Emergency Medicine

## 2015-09-27 DIAGNOSIS — Z794 Long term (current) use of insulin: Secondary | ICD-10-CM | POA: Insufficient documentation

## 2015-09-27 DIAGNOSIS — E1165 Type 2 diabetes mellitus with hyperglycemia: Secondary | ICD-10-CM | POA: Diagnosis present

## 2015-09-27 DIAGNOSIS — F17210 Nicotine dependence, cigarettes, uncomplicated: Secondary | ICD-10-CM | POA: Diagnosis not present

## 2015-09-27 DIAGNOSIS — Z7982 Long term (current) use of aspirin: Secondary | ICD-10-CM | POA: Diagnosis not present

## 2015-09-27 DIAGNOSIS — I10 Essential (primary) hypertension: Secondary | ICD-10-CM | POA: Diagnosis not present

## 2015-09-27 DIAGNOSIS — E1151 Type 2 diabetes mellitus with diabetic peripheral angiopathy without gangrene: Secondary | ICD-10-CM | POA: Insufficient documentation

## 2015-09-27 DIAGNOSIS — J45909 Unspecified asthma, uncomplicated: Secondary | ICD-10-CM | POA: Insufficient documentation

## 2015-09-27 DIAGNOSIS — Z79899 Other long term (current) drug therapy: Secondary | ICD-10-CM | POA: Diagnosis not present

## 2015-09-27 LAB — CBG MONITORING, ED
GLUCOSE-CAPILLARY: 454 mg/dL — AB (ref 65–99)
GLUCOSE-CAPILLARY: 316 mg/dL — AB (ref 65–99)
Glucose-Capillary: 160 mg/dL — ABNORMAL HIGH (ref 65–99)

## 2015-09-27 LAB — COMPREHENSIVE METABOLIC PANEL
ALT: 14 U/L (ref 14–54)
AST: 17 U/L (ref 15–41)
Albumin: 4.1 g/dL (ref 3.5–5.0)
Alkaline Phosphatase: 70 U/L (ref 38–126)
Anion gap: 10 (ref 5–15)
BUN: 16 mg/dL (ref 6–20)
CHLORIDE: 98 mmol/L — AB (ref 101–111)
CO2: 26 mmol/L (ref 22–32)
CREATININE: 0.65 mg/dL (ref 0.44–1.00)
Calcium: 9.4 mg/dL (ref 8.9–10.3)
GFR calc Af Amer: >60 mL/min (ref >60)
Glucose, Bld: 453 mg/dL — ABNORMAL HIGH (ref 65–99)
Potassium: 3.3 mmol/L — ABNORMAL LOW (ref 3.5–5.1)
SODIUM: 134 mmol/L — AB (ref 135–145)
Total Bilirubin: 0.4 mg/dL (ref 0.3–1.2)
Total Protein: 8 g/dL (ref 6.5–8.1)

## 2015-09-27 LAB — CBC WITH DIFFERENTIAL/PLATELET
BASOS ABS: 0 10*3/uL (ref 0.0–0.1)
Basophils Relative: 0 %
EOS PCT: 0 %
Eosinophils Absolute: 0 10*3/uL (ref 0.0–0.7)
HCT: 44.5 % (ref 36.0–46.0)
Hemoglobin: 15.4 g/dL — ABNORMAL HIGH (ref 12.0–15.0)
LYMPHS PCT: 14 %
Lymphs Abs: 1.6 10*3/uL (ref 0.7–4.0)
MCH: 33.6 pg (ref 26.0–34.0)
MCHC: 34.6 g/dL (ref 30.0–36.0)
MCV: 97.2 fL (ref 78.0–100.0)
Monocytes Absolute: 0.5 10*3/uL (ref 0.1–1.0)
Monocytes Relative: 5 %
Neutro Abs: 8.7 10*3/uL — ABNORMAL HIGH (ref 1.7–7.7)
Neutrophils Relative %: 81 %
PLATELETS: 245 10*3/uL (ref 150–400)
RBC: 4.58 MIL/uL (ref 3.87–5.11)
RDW: 12.7 % (ref 11.5–15.5)
WBC: 10.8 10*3/uL — AB (ref 4.0–10.5)

## 2015-09-27 LAB — URINALYSIS, ROUTINE W REFLEX MICROSCOPIC
Bilirubin Urine: NEGATIVE
Hgb urine dipstick: NEGATIVE
KETONES UR: NEGATIVE mg/dL
LEUKOCYTES UA: NEGATIVE
NITRITE: NEGATIVE
PROTEIN: NEGATIVE mg/dL
Specific Gravity, Urine: <1.005 — ABNORMAL LOW (ref 1.005–1.030)
pH: 5.5 (ref 5.0–8.0)

## 2015-09-27 LAB — URINE MICROSCOPIC-ADD ON

## 2015-09-27 MED ORDER — SODIUM CHLORIDE 0.9 % IV BOLUS (SEPSIS)
1000.0000 mL | Freq: Once | INTRAVENOUS | Status: AC
  Administered 2015-09-27: 1000 mL via INTRAVENOUS

## 2015-09-27 MED ORDER — INSULIN ASPART 100 UNIT/ML ~~LOC~~ SOLN
10.0000 [IU] | Freq: Once | SUBCUTANEOUS | Status: AC
  Administered 2015-09-27: 10 [IU] via INTRAVENOUS
  Filled 2015-09-27: qty 1

## 2015-09-27 MED ORDER — INSULIN ASPART PROT & ASPART (70-30 MIX) 100 UNIT/ML ~~LOC~~ SUSP: 5.0000 [IU] | Freq: Three times a day (TID) | SUBCUTANEOUS | Status: DC

## 2015-09-27 MED ORDER — ONDANSETRON HCL 4 MG/2ML IJ SOLN
4.0000 mg | Freq: Once | INTRAMUSCULAR | Status: AC
  Administered 2015-09-27: 4 mg via INTRAVENOUS
  Filled 2015-09-27: qty 2

## 2015-09-27 NOTE — ED Provider Notes (Signed)
CSN: [id number]     Arrival date & time 09/27/15  1227 History   First MD Initiated Contact with Patient 09/27/15 1255     Chief Complaint  Patient presents with  . Hyperglycemia   Pt is a 50 yo wf with a hx of DM2 who presents to the ed with hyperglycemia.  The patient did see endocrinology (Dr. Glade Lloyd) on 7/5.  Since then, this is her 3rd visit to the ED.  On 7/5 pt's insulin was increased to 30 units from 20.  Short acting insulin 5 units TID was also supposed to be added, but pt said that no one gave her a rx for the short acting insulin.   Pt back in the ED on 7/6 and short acting insulin was increased to 10 units after each meal if bd greater than 300.  Pt has been using the Lantus for the 5 units TID instead of a shorter acting insulin.  Pt's sugar won't come down.  Pt has an appt with her endocrinologist tomorrow at 3.  (Consider location/radiation/quality/duration/timing/severity/associated sxs/prior Treatment) The history is provided by the patient.    Past Medical History  Diagnosis Date  . Asthma   . Diabetes mellitus   . Hypertension   . PVD (peripheral vascular disease) ([place])   . DDD (degenerative disc disease), lumbar    Past Surgical History  Procedure Laterality Date  . Femoral artery stent  right leg  . Back surgery    . Coronary angioplasty with stent placement    . Coronary artery bypass graft     Family History  Problem Relation Age of Onset  . Stroke Mother   . Hypertension Mother   . Heart failure Father    Social History  Substance Use Topics  . Smoking status: Current Every Day Smoker -- 0.50 packs/day for 30 years    Types: Cigarettes  . Smokeless tobacco: Never Used  . Alcohol Use: No   OB History    Gravida Para Term Preterm AB TAB SAB Ectopic Multiple Living   2 1  1 1  1   1      Review of Systems  Endocrine: Positive for polydipsia and polyuria.  Neurological: Positive for headaches.  All other systems reviewed and are  negative.     Allergies  Review of patient's allergies indicates no known allergies.  Home Medications   Prior to Admission medications   Medication Sig Start Date End Date Taking? Authorizing Provider  aspirin EC 81 MG tablet Take 81 mg by mouth every morning.    Yes Historical Provider, MD  budesonide-formoterol (SYMBICORT) 160-4.5 MCG/ACT inhaler Inhale 2 puffs into the lungs 2 (two) times daily.   Yes Historical Provider, MD  Carboxymethylcellul-Glycerin (CLEAR EYES FOR DRY EYES OP) Place 2 drops into both eyes 2 (two) times daily.   Yes Historical Provider, MD  esomeprazole (NEXIUM) 20 MG capsule Take 20 mg by mouth daily at 12 noon.   Yes Historical Provider, MD  GARLIC PO Take 1 tablet by mouth daily.   Yes Historical Provider, MD  glucose blood (ONE TOUCH TEST STRIPS) test strip Use as instructed 09/14/15  Yes Cassandria Anger, MD  Insulin Glargine (TOUJEO SOLOSTAR) 300 UNIT/ML SOPN Inject 30 Units into the skin at bedtime. Patient taking differently: Inject 5 Units into the skin 3 (three) times daily after meals.  09/16/15  Yes Davonna Belling, MD  Insulin Pen Needle (B-D UF III MINI PEN NEEDLES) 31G X 5 MM MISC Use  qhs 09/17/15  Yes Cassandria Anger, MD  lisinopril (PRINIVIL,ZESTRIL) 20 MG tablet Take 20 mg by mouth daily.   Yes Historical Provider, MD  Multiple Vitamin (MULTIVITAMIN WITH MINERALS) TABS tablet Take 1 tablet by mouth daily.   Yes Historical Provider, MD  ondansetron (ZOFRAN ODT) 4 MG disintegrating tablet Take 1 tablet (4 mg total) by mouth every 8 (eight) hours as needed for nausea or vomiting. 09/14/15  Yes Kristen N Ward, DO  insulin aspart protamine- aspart (NOVOLOG MIX 70/30) (70-30) 100 UNIT/ML injection Inject 0.05 mLs (5 Units total) into the skin 3 (three) times daily with meals. 09/27/15   Isla Pence, MD  potassium chloride SA (K-DUR,KLOR-CON) 20 MEQ tablet Take 2 tablets (40 mEq total) by mouth daily. Patient not taking: Reported on 09/17/2015 09/14/15  09/15/15  [name], MD   BP 159/78 mmHg  Pulse 108  Temp(Src) 98.4 F (36.9 C) (Oral)  Resp 18  Ht 5\' 4"  (1.626 m)  Wt 163 lb (73.936 kg)  BMI 27.97 kg/m2  SpO2 100% Physical Exam  Constitutional: She is oriented to person, place, and time. She appears well-developed and well-nourished.  HENT:  Head: Normocephalic and atraumatic.  Right Ear: External ear normal.  Left Ear: External ear normal.  Nose: Nose normal.  Mouth/Throat: Mucous membranes are dry.  Eyes: Conjunctivae and EOM are normal. Pupils are equal, round, and reactive to light.  Neck: Normal range of motion. Neck supple.  Cardiovascular: Regular rhythm.  Tachycardia present.   Pulmonary/Chest: Effort normal and breath sounds normal.  Abdominal: Soft. Bowel sounds are normal.  Musculoskeletal: Normal range of motion.  Neurological: She is alert and oriented to person, place, and time.  Skin: Skin is warm and dry.  Psychiatric: She has a normal mood and affect. Her behavior is normal. Judgment and thought content normal.  Vitals reviewed.   ED Course  Procedures (including critical care time) Labs Review Labs Reviewed  CBC WITH DIFFERENTIAL/PLATELET - Abnormal; Notable for the following:    WBC 10.8 (*)    Hemoglobin 15.4 (*)    Neutro Abs 8.7 (*)    All other components within normal limits  COMPREHENSIVE METABOLIC PANEL - Abnormal; Notable for the following:    Sodium 134 (*)    Potassium 3.3 (*)    Chloride 98 (*)    Glucose, Bld 453 (*)    All other components within normal limits  URINALYSIS, ROUTINE W REFLEX MICROSCOPIC (NOT AT [facility]) - Abnormal; Notable for the following:    Specific Gravity, Urine <1.005 (*)    Glucose, UA >1000 (*)    All other components within normal limits  URINE MICROSCOPIC-ADD ON - Abnormal; Notable for the following:    Squamous Epithelial / LPF 0-5 (*)    Bacteria, UA RARE (*)    All other components within normal limits  CBG MONITORING, ED - Abnormal; Notable for the  following:    Glucose-Capillary 454 (*)    All other components within normal limits  CBG MONITORING, ED - Abnormal; Notable for the following:    Glucose-Capillary 316 (*)    All other components within normal limits  CBG MONITORING, ED  CBG MONITORING, ED  CBG MONITORING, ED    Imaging Review No results found. I have personally reviewed and evaluated these images and lab results as part of my medical decision-making.   EKG Interpretation None      MDM  BS down to 160 after 20 units of novolog and 1 L IVF.  Pt feels much better. I gave pt a rx for novolog 70/30 to take 5 units tid with meals as all pt had was lantus.   Pt has an appt tomorrow with Dr. Dorris Fetch.  She knows to return if worse.  Final diagnoses:  Poorly controlled type 2 diabetes mellitus ([place])       Isla Pence, MD 09/27/15 [phone]

## 2015-09-27 NOTE — ED Notes (Addendum)
Patient has multiple complaints. Patient c/o hyperglycemia. Per patient took insulin last night, woke this morning with blood sugar of 300, took 5 units of insulin with no decrease in blood sugar. Blood sugar 453 in triage. Patient also reports nausea and vomiting that started yesterday. Patient also c/o pain in neck and headache that per patient feels like fire radiating from neck to her head. Patient states woke this morning with numbness in left hand and leftt leg. Denies any weakness, slurred speech, or facial drooping. Per patient no numbness last night prior to going to bed at 10pm, noticed upon waking this morning at 6:30am.

## 2015-09-28 ENCOUNTER — Ambulatory Visit: Payer: Self-pay | Admitting: Nutrition

## 2015-09-28 ENCOUNTER — Other Ambulatory Visit: Payer: Self-pay

## 2015-09-28 ENCOUNTER — Encounter: Payer: Self-pay | Admitting: "Endocrinology

## 2015-09-28 ENCOUNTER — Ambulatory Visit (INDEPENDENT_AMBULATORY_CARE_PROVIDER_SITE_OTHER): Payer: BLUE CROSS/BLUE SHIELD | Admitting: "Endocrinology

## 2015-09-28 VITALS — BP 148/77 | HR 87 | Ht 64.0 in | Wt 136.0 lb

## 2015-09-28 DIAGNOSIS — Z9119 Patient's noncompliance with other medical treatment and regimen: Secondary | ICD-10-CM | POA: Diagnosis not present

## 2015-09-28 DIAGNOSIS — I10 Essential (primary) hypertension: Secondary | ICD-10-CM | POA: Diagnosis not present

## 2015-09-28 DIAGNOSIS — E1165 Type 2 diabetes mellitus with hyperglycemia: Secondary | ICD-10-CM | POA: Diagnosis not present

## 2015-09-28 DIAGNOSIS — Z91199 Patient's noncompliance with other medical treatment and regimen due to unspecified reason: Secondary | ICD-10-CM

## 2015-09-28 DIAGNOSIS — E118 Type 2 diabetes mellitus with unspecified complications: Secondary | ICD-10-CM

## 2015-09-28 DIAGNOSIS — IMO0002 Reserved for concepts with insufficient information to code with codable children: Secondary | ICD-10-CM

## 2015-09-28 DIAGNOSIS — Z794 Long term (current) use of insulin: Secondary | ICD-10-CM | POA: Diagnosis not present

## 2015-09-28 MED ORDER — INSULIN ASPART 100 UNIT/ML FLEXPEN: 5.0000 [IU] | PEN_INJECTOR | Freq: Three times a day (TID) | SUBCUTANEOUS | Status: DC

## 2015-09-28 MED ORDER — ACCU-CHEK AVIVA DEVI: Status: DC

## 2015-09-28 MED ORDER — GLUCOSE BLOOD VI STRP: ORAL_STRIP | Status: DC

## 2015-09-28 NOTE — Progress Notes (Signed)
Subjective:    Patient ID: Alexandria Webster, female    DOB: 10/22/1965. Patient is being seen in f/u  for management of diabetes requested by  Purvis Kilts, MD  Past Medical History  Diagnosis Date  . Asthma   . Diabetes mellitus   . Hypertension   . PVD (peripheral vascular disease) ([place])   . DDD (degenerative disc disease), lumbar    Past Surgical History  Procedure Laterality Date  . Femoral artery stent  right leg  . Back surgery    . Coronary angioplasty with stent placement    . Coronary artery bypass graft     Social History   Social History  . Marital Status: Married    Spouse Name: N/A  . Number of Children: N/A  . Years of Education: N/A   Social History Main Topics  . Smoking status: Current Every Day Smoker -- 0.50 packs/day for 30 years    Types: Cigarettes  . Smokeless tobacco: Never Used  . Alcohol Use: No  . Drug Use: No  . Sexual Activity: Yes    Birth Control/ Protection: None   Other Topics Concern  . None   Social History Narrative   Outpatient Encounter Prescriptions as of 09/28/2015  Medication Sig  . aspirin EC 81 MG tablet Take 81 mg by mouth every morning.   . Blood Glucose Monitoring Suppl (ACCU-CHEK AVIVA) device Use as instructed  . budesonide-formoterol (SYMBICORT) 160-4.5 MCG/ACT inhaler Inhale 2 puffs into the lungs 2 (two) times daily.  . Carboxymethylcellul-Glycerin (CLEAR EYES FOR DRY EYES OP) Place 2 drops into both eyes 2 (two) times daily.  Marland Kitchen esomeprazole (NEXIUM) 20 MG capsule Take 20 mg by mouth daily at 12 noon.  Marland Kitchen GARLIC PO Take 1 tablet by mouth daily.  Marland Kitchen glucose blood (ACCU-CHEK AVIVA) test strip Test glucose 4 times a day  . insulin aspart (NOVOLOG FLEXPEN) 100 UNIT/ML FlexPen Inject 5-11 Units into the skin 3 (three) times daily with meals.  . Insulin Glargine (TOUJEO SOLOSTAR) 300 UNIT/ML SOPN Inject 30 Units into the skin at bedtime. (Patient taking differently: Inject 5 Units into the skin 3 (three) times  daily after meals. )  . Insulin Pen Needle (B-D UF III MINI PEN NEEDLES) 31G X 5 MM MISC Use qhs  . lisinopril (PRINIVIL,ZESTRIL) 20 MG tablet Take 20 mg by mouth daily.  . Multiple Vitamin (MULTIVITAMIN WITH MINERALS) TABS tablet Take 1 tablet by mouth daily.  . ondansetron (ZOFRAN ODT) 4 MG disintegrating tablet Take 1 tablet (4 mg total) by mouth every 8 (eight) hours as needed for nausea or vomiting.  . potassium chloride SA (K-DUR,KLOR-CON) 20 MEQ tablet Take 2 tablets (40 mEq total) by mouth daily. (Patient not taking: Reported on 09/17/2015)  . [DISCONTINUED] glucose blood (ONETOUCH VERIO) test strip Use as instructed 4 x daily. E11.65  . [DISCONTINUED] insulin aspart protamine- aspart (NOVOLOG MIX 70/30) (70-30) 100 UNIT/ML injection Inject 0.05 mLs (5 Units total) into the skin 3 (three) times daily with meals. (Patient not taking: Reported on 09/28/2015)   No facility-administered encounter medications on file as of 09/28/2015.   ALLERGIES: No Known Allergies VACCINATION STATUS:  There is no immunization history on file for this patient.  Diabetes She presents for her follow-up diabetic visit. She has type 2 diabetes mellitus. Onset time: She was diagnosed at approximate age of 50 years. Her disease course has been worsening (She was seen in the emergency room with hyperglycemia and near DKA yesterday.  ER doctor called on behalf of patient. And she was offered her walking visit today. Her last visit with me was more than 3 years ago.). There are no hypoglycemic associated symptoms. Pertinent negatives for hypoglycemia include no confusion, headaches, pallor or seizures. Associated symptoms include fatigue, polydipsia and polyuria. Pertinent negatives for diabetes include no chest pain and no polyphagia. There are no hypoglycemic complications. Symptoms are worsening. Diabetic complications include heart disease and nephropathy. Risk factors for coronary artery disease include diabetes  mellitus, dyslipidemia, sedentary lifestyle and tobacco exposure. Current diabetic treatment includes insulin injections. She is compliant with treatment none of the time (She did not keep her last appointment, came with no meter nor log. She visited emergency room since last visit, was started on NovoLog 70/30 30 units twice a day.). Her weight is stable. She is following a generally unhealthy diet. When asked about meal planning, she reported none. She has not had a previous visit with a dietitian. She never (She has fracture of right lower extremity currently in cost.) participates in exercise. Home blood sugar record trend: She does not monitor blood glucose for lack of supplies. (She went to the ER with blood glucose of 400, left the hospital with blood glucose of 187. ) An ACE inhibitor/angiotensin II receptor blocker is being taken.  Hypertension This is a chronic problem. The current episode started more than 1 year ago. The problem is controlled. Pertinent negatives include no chest pain, headaches, palpitations or shortness of breath. Risk factors for coronary artery disease include dyslipidemia, diabetes mellitus, smoking/tobacco exposure and sedentary lifestyle. Past treatments include ACE inhibitors. Hypertensive end-organ damage includes kidney disease and CAD/MI.     Review of Systems  Constitutional: Positive for fatigue. Negative for fever, chills and unexpected weight change.  HENT: Negative for trouble swallowing and voice change.   Eyes: Negative for visual disturbance.  Respiratory: Negative for cough, shortness of breath and wheezing.   Cardiovascular: Negative for chest pain, palpitations and leg swelling.  Gastrointestinal: Negative for nausea, vomiting and diarrhea.  Endocrine: Positive for polydipsia and polyuria. Negative for cold intolerance, heat intolerance and polyphagia.  Musculoskeletal: Positive for gait problem. Negative for myalgias and arthralgias.       She has  fracture of right lower extremity in the past.  Skin: Negative for color change, pallor, rash and wound.  Neurological: Negative for seizures and headaches.  Psychiatric/Behavioral: Negative for suicidal ideas and confusion.    Objective:    BP 148/77 mmHg  Pulse 87  Ht 5\' 4"  (1.626 m)  Wt 136 lb (61.689 kg)  BMI 23.33 kg/m2  Wt Readings from Last 3 Encounters:  09/28/15 136 lb (61.689 kg)  09/27/15 163 lb (73.936 kg)  09/17/15 163 lb (73.936 kg)    Physical Exam  Constitutional: She is oriented to person, place, and time. She appears well-developed.  HENT:  Head: Normocephalic and atraumatic.  Eyes: EOM are normal.  Neck: Normal range of motion. Neck supple. No tracheal deviation present. No thyromegaly present.  Cardiovascular: Normal rate and regular rhythm.   Pulmonary/Chest: Effort normal and breath sounds normal.  Abdominal: Soft. Bowel sounds are normal. There is no tenderness. There is no guarding.  Musculoskeletal: She exhibits no edema.  Neurological: She is alert and oriented to person, place, and time. She has normal reflexes. No cranial nerve deficit. Coordination normal.  Skin: Skin is warm and dry. No rash noted. No erythema. No pallor.  Psychiatric: She has a normal mood and affect. Judgment normal.  CMP ( most recent) CMP     Component Value Date/Time   NA 134* 09/27/2015 1310   K 3.3* 09/27/2015 1310   CL 98* 09/27/2015 1310   CO2 26 09/27/2015 1310   GLUCOSE 453* 09/27/2015 1310   BUN 16 09/27/2015 1310   CREATININE 0.65 09/27/2015 1310   CALCIUM 9.4 09/27/2015 1310   PROT 8.0 09/27/2015 1310   ALBUMIN 4.1 09/27/2015 1310   AST 17 09/27/2015 1310   ALT 14 09/27/2015 1310   ALKPHOS 70 09/27/2015 1310   BILITOT 0.4 09/27/2015 1310   GFRNONAA >60 09/27/2015 1310   GFRAA >60 09/27/2015 1310    Diabetic Labs (most recent): Lab Results  Component Value Date   HGBA1C 12.5* 11/07/2012     Assessment & Plan:   1. Uncontrolled type 2  diabetes mellitus with Cardiac complication, with long-term current use of insulin ([place])  - Patient has currently uncontrolled symptomatic type 2 DM since  50 years of age,  with  No recent A1c however her last documented A1c was 12.5% in 2014.   Her diabetes is complicated by  coronary artery disease, chronic kidney disease, noncompliance, and patient remains at a high risk for more acute and chronic complications of diabetes which include CAD, CVA, CKD, retinopathy, and neuropathy. These are all discussed in detail with the patient.  - I have counseled the patient on diet management by adopting a carbohydrate restricted/protein rich diet.  - Suggestion is made for patient to avoid simple carbohydrates   from their diet including Cakes , Desserts, Ice Cream,  Soda (  diet and regular) , Sweet Tea , Candies,  Chips, Cookies, Artificial Sweeteners,   and "Sugar-free" Products . This will help patient to have stable blood glucose profile and potentially avoid unintended weight gain.  - I encouraged the patient to switch to  unprocessed or minimally processed complex starch and increased protein intake (animal or plant source), fruits, and vegetables.  - Patient is advised to stick to a routine mealtimes to eat 3 meals  a day and avoid unnecessary snacks ( to snack only to correct hypoglycemia).  - The patient will be scheduled with Jearld Fenton, RDN, CDE for individualized DM education.  - Emphasizing the need for compliance, I have approached patient with the following individualized plan to manage diabetes and patient agrees:   - Unfortunately patient remains alarmingly noncompliant. She has not started most of the plans I gave her last visit. I have initiated monitoring of blood glucose before meals and at bedtime. I have advised her to continue Toujeo 30 units daily at bedtime, initiate NovoLog 5 units 3 times a day before meals plus correction. -I will prescribed a new meter along with strips  and lancets for her. - I advised her not to take NovoLog 70/30 while taking Toujeo.  - Patient is warned not to take insulin without proper monitoring per orders. -Adjustment parameters are given for hypo and hyperglycemia in writing. -Patient is encouraged to call clinic for blood glucose levels less than 70 or above 200 mg /dl. - Her renal function is favorable, she will be considered for low-dose metformin next visit.  -She is not a candidate for SGLT 2 inhibitors,  incretin therapy. - Patient specific target  A1c;  LDL, HDL, Triglycerides, and  Waist Circumference were discussed in detail.  2) BP/HTN:controlled. Continue current medications including ACEI/ARB. 3) Lipids/HPL: lipid panel unknown, she is not on statins. I will obtain lipid panel on subsequent visits.  4)  Weight/Diet: CDE Consult will be initiated , exercise, and detailed carbohydrates information provided.  5) Chronic Care/Health Maintenance:  -Patient is on ACEI/ARB and encouraged to continue to follow up with Ophthalmology, Podiatrist at least yearly or according to recommendations, and advised to   quit stay away from smoking. I have recommended yearly flu vaccine and pneumonia vaccination at least every 5 years; moderate intensity exercise for up to 150 minutes weekly; and  sleep for at least 7 hours a day.  - 25 minutes of time was spent on the care of this patient , 50% of which was applied for counseling on diabetes complications and their preventions.  - Patient to bring meter and  blood glucose logs during their next visit.   - I advised patient to maintain close follow up with Purvis Kilts, MD for primary care needs.  Follow up plan: - Return in about 1 week (around 10/05/2015) for follow up with meter and logs- no labs.  Glade Lloyd, MD Phone: [phone]  Fax: [phone]   09/28/2015, 3:37 PM

## 2015-10-01 ENCOUNTER — Ambulatory Visit (INDEPENDENT_AMBULATORY_CARE_PROVIDER_SITE_OTHER): Payer: BLUE CROSS/BLUE SHIELD | Admitting: "Endocrinology

## 2015-10-01 ENCOUNTER — Encounter (HOSPITAL_COMMUNITY): Payer: Self-pay | Admitting: *Deleted

## 2015-10-01 ENCOUNTER — Encounter (HOSPITAL_COMMUNITY): Payer: Self-pay | Admitting: Emergency Medicine

## 2015-10-01 ENCOUNTER — Encounter: Payer: Self-pay | Admitting: "Endocrinology

## 2015-10-01 ENCOUNTER — Observation Stay (HOSPITAL_COMMUNITY)
Admission: EM | Admit: 2015-10-01 | Discharge: 2015-10-05 | Disposition: A | Discharge status: Home / Self Care | Payer: BLUE CROSS/BLUE SHIELD | Attending: Internal Medicine | Admitting: Internal Medicine

## 2015-10-01 VITALS — BP 150/92 | HR 97 | Resp 18 | Ht 64.0 in | Wt 135.0 lb

## 2015-10-01 DIAGNOSIS — F17210 Nicotine dependence, cigarettes, uncomplicated: Secondary | ICD-10-CM | POA: Insufficient documentation

## 2015-10-01 DIAGNOSIS — K5900 Constipation, unspecified: Secondary | ICD-10-CM

## 2015-10-01 DIAGNOSIS — I739 Peripheral vascular disease, unspecified: Secondary | ICD-10-CM | POA: Diagnosis not present

## 2015-10-01 DIAGNOSIS — Z7982 Long term (current) use of aspirin: Secondary | ICD-10-CM | POA: Insufficient documentation

## 2015-10-01 DIAGNOSIS — D72829 Elevated white blood cell count, unspecified: Secondary | ICD-10-CM | POA: Diagnosis present

## 2015-10-01 DIAGNOSIS — K2970 Gastritis, unspecified, without bleeding: Secondary | ICD-10-CM | POA: Diagnosis not present

## 2015-10-01 DIAGNOSIS — I10 Essential (primary) hypertension: Secondary | ICD-10-CM | POA: Diagnosis not present

## 2015-10-01 DIAGNOSIS — Z9119 Patient's noncompliance with other medical treatment and regimen: Secondary | ICD-10-CM

## 2015-10-01 DIAGNOSIS — E118 Type 2 diabetes mellitus with unspecified complications: Secondary | ICD-10-CM

## 2015-10-01 DIAGNOSIS — IMO0002 Reserved for concepts with insufficient information to code with codable children: Secondary | ICD-10-CM

## 2015-10-01 DIAGNOSIS — E1143 Type 2 diabetes mellitus with diabetic autonomic (poly)neuropathy: Secondary | ICD-10-CM

## 2015-10-01 DIAGNOSIS — Z79899 Other long term (current) drug therapy: Secondary | ICD-10-CM | POA: Diagnosis not present

## 2015-10-01 DIAGNOSIS — Z91199 Patient's noncompliance with other medical treatment and regimen due to unspecified reason: Secondary | ICD-10-CM

## 2015-10-01 DIAGNOSIS — E1165 Type 2 diabetes mellitus with hyperglycemia: Principal | ICD-10-CM | POA: Insufficient documentation

## 2015-10-01 DIAGNOSIS — Z794 Long term (current) use of insulin: Secondary | ICD-10-CM | POA: Diagnosis not present

## 2015-10-01 DIAGNOSIS — J45909 Unspecified asthma, uncomplicated: Secondary | ICD-10-CM | POA: Diagnosis not present

## 2015-10-01 DIAGNOSIS — E119 Type 2 diabetes mellitus without complications: Secondary | ICD-10-CM

## 2015-10-01 DIAGNOSIS — K3184 Gastroparesis: Secondary | ICD-10-CM

## 2015-10-01 LAB — URINALYSIS, ROUTINE W REFLEX MICROSCOPIC
BILIRUBIN URINE: NEGATIVE
Glucose, UA: >1000 mg/dL — AB
HGB URINE DIPSTICK: NEGATIVE
KETONES UR: NEGATIVE mg/dL
Leukocytes, UA: NEGATIVE
Nitrite: NEGATIVE
PH: 5 (ref 5.0–8.0)
Protein, ur: NEGATIVE mg/dL

## 2015-10-01 LAB — RAPID URINE DRUG SCREEN, HOSP PERFORMED
Amphetamines: NOT DETECTED
BARBITURATES: NOT DETECTED
Benzodiazepines: NOT DETECTED
COCAINE: NOT DETECTED
Opiates: NOT DETECTED
Tetrahydrocannabinol: NOT DETECTED

## 2015-10-01 LAB — BASIC METABOLIC PANEL WITH GFR
Anion gap: 9 (ref 5–15)
BUN: 10 mg/dL (ref 6–20)
CO2: 24 mmol/L (ref 22–32)
Calcium: 9.3 mg/dL (ref 8.9–10.3)
Chloride: 103 mmol/L (ref 101–111)
Creatinine, Ser: 0.7 mg/dL (ref 0.44–1.00)
GFR calc Af Amer: >60 mL/min
GFR calc non Af Amer: >60 mL/min
Glucose, Bld: 469 mg/dL — ABNORMAL HIGH (ref 65–99)
Potassium: 3.6 mmol/L (ref 3.5–5.1)
Sodium: 136 mmol/L (ref 135–145)

## 2015-10-01 LAB — CBG MONITORING, ED
GLUCOSE-CAPILLARY: 222 mg/dL — AB (ref 65–99)
Glucose-Capillary: 538 mg/dL (ref 65–99)

## 2015-10-01 LAB — CBC
HCT: 42.7 % (ref 36.0–46.0)
Hemoglobin: 14.5 g/dL (ref 12.0–15.0)
MCH: 33.1 pg (ref 26.0–34.0)
MCHC: 34 g/dL (ref 30.0–36.0)
MCV: 97.5 fL (ref 78.0–100.0)
Platelets: 239 K/uL (ref 150–400)
RBC: 4.38 MIL/uL (ref 3.87–5.11)
RDW: 12.7 % (ref 11.5–15.5)
WBC: 6.1 K/uL (ref 4.0–10.5)

## 2015-10-01 LAB — URINE MICROSCOPIC-ADD ON
RBC / HPF: NONE SEEN RBC/hpf (ref 0–5)
WBC, UA: NONE SEEN WBC/hpf (ref 0–5)

## 2015-10-01 LAB — GLUCOSE, CAPILLARY: Glucose-Capillary: 119 mg/dL — ABNORMAL HIGH (ref 65–99)

## 2015-10-01 LAB — GLUCOSE, POCT (MANUAL RESULT ENTRY): POC Glucose: 428 mg/dL — AB (ref 70–99)

## 2015-10-01 MED ORDER — MOMETASONE FURO-FORMOTEROL FUM 200-5 MCG/ACT IN AERO
2.0000 | INHALATION_SPRAY | Freq: Two times a day (BID) | RESPIRATORY_TRACT | Status: DC
  Administered 2015-10-02 – 2015-10-05 (×6): 2 via RESPIRATORY_TRACT
  Filled 2015-10-01 (×2): qty 8.8

## 2015-10-01 MED ORDER — ENOXAPARIN SODIUM 40 MG/0.4ML ~~LOC~~ SOLN
40.0000 mg | SUBCUTANEOUS | Status: DC
  Administered 2015-10-01 – 2015-10-04 (×4): 40 mg via SUBCUTANEOUS
  Filled 2015-10-01 (×4): qty 0.4

## 2015-10-01 MED ORDER — INSULIN GLARGINE 300 UNIT/ML ~~LOC~~ SOPN: 30.0000 [IU] | PEN_INJECTOR | Freq: Every day | SUBCUTANEOUS | Status: DC

## 2015-10-01 MED ORDER — ASPIRIN EC 81 MG PO TBEC
81.0000 mg | DELAYED_RELEASE_TABLET | Freq: Every day | ORAL | Status: DC
Start: 2015-10-02 — End: 2015-10-05
  Administered 2015-10-02 – 2015-10-05 (×4): 81 mg via ORAL
  Filled 2015-10-01 (×4): qty 1

## 2015-10-01 MED ORDER — INSULIN ASPART 100 UNIT/ML ~~LOC~~ SOLN
0.0000 [IU] | Freq: Every day | SUBCUTANEOUS | Status: DC
  Administered 2015-10-02: 4 [IU] via SUBCUTANEOUS
  Administered 2015-10-03 – 2015-10-04 (×2): 2 [IU] via SUBCUTANEOUS

## 2015-10-01 MED ORDER — SODIUM CHLORIDE 0.9 % IV SOLN
INTRAVENOUS | Status: AC
  Administered 2015-10-01: 22:00:00 via INTRAVENOUS

## 2015-10-01 MED ORDER — OXYCODONE HCL 5 MG PO TABS
5.0000 mg | ORAL_TABLET | Freq: Once | ORAL | Status: DC
  Filled 2015-10-01: qty 1

## 2015-10-01 MED ORDER — INSULIN ASPART 100 UNIT/ML ~~LOC~~ SOLN
6.0000 [IU] | Freq: Three times a day (TID) | SUBCUTANEOUS | Status: DC
  Administered 2015-10-02 – 2015-10-05 (×8): 6 [IU] via SUBCUTANEOUS

## 2015-10-01 MED ORDER — ACETAMINOPHEN 650 MG RE SUPP: 650.0000 mg | Freq: Four times a day (QID) | RECTAL | Status: DC | PRN

## 2015-10-01 MED ORDER — PANTOPRAZOLE SODIUM 40 MG PO TBEC
40.0000 mg | DELAYED_RELEASE_TABLET | Freq: Every day | ORAL | Status: DC
Start: 2015-10-02 — End: 2015-10-05
  Administered 2015-10-02 – 2015-10-05 (×4): 40 mg via ORAL
  Filled 2015-10-01 (×4): qty 1

## 2015-10-01 MED ORDER — ONDANSETRON HCL 4 MG/2ML IJ SOLN
4.0000 mg | Freq: Four times a day (QID) | INTRAMUSCULAR | Status: DC | PRN
  Administered 2015-10-01: 4 mg via INTRAVENOUS
  Filled 2015-10-01 (×3): qty 2

## 2015-10-01 MED ORDER — PROMETHAZINE HCL 25 MG/ML IJ SOLN
12.5000 mg | Freq: Once | INTRAMUSCULAR | Status: AC
  Administered 2015-10-01: 12.5 mg via INTRAVENOUS
  Filled 2015-10-01: qty 1

## 2015-10-01 MED ORDER — SODIUM CHLORIDE 0.9 % IV BOLUS (SEPSIS)
1000.0000 mL | Freq: Once | INTRAVENOUS | Status: AC
  Administered 2015-10-01: 1000 mL via INTRAVENOUS

## 2015-10-01 MED ORDER — FAMOTIDINE IN NACL 20-0.9 MG/50ML-% IV SOLN: 20.0000 mg | Freq: Once | INTRAVENOUS | Status: DC

## 2015-10-01 MED ORDER — LISINOPRIL 10 MG PO TABS
20.0000 mg | ORAL_TABLET | Freq: Every day | ORAL | Status: DC
  Administered 2015-10-02 – 2015-10-05 (×4): 20 mg via ORAL
  Filled 2015-10-01 (×4): qty 2

## 2015-10-01 MED ORDER — INSULIN ASPART 100 UNIT/ML ~~LOC~~ SOLN
5.0000 [IU] | Freq: Once | SUBCUTANEOUS | Status: AC
  Administered 2015-10-01: 5 [IU] via INTRAVENOUS
  Filled 2015-10-01: qty 1

## 2015-10-01 MED ORDER — FAMOTIDINE IN NACL 20-0.9 MG/50ML-% IV SOLN
20.0000 mg | Freq: Once | INTRAVENOUS | Status: AC
  Administered 2015-10-01: 20 mg via INTRAVENOUS
  Filled 2015-10-01: qty 50

## 2015-10-01 MED ORDER — ONDANSETRON HCL 4 MG/2ML IJ SOLN
4.0000 mg | Freq: Once | INTRAMUSCULAR | Status: AC
  Administered 2015-10-01: 4 mg via INTRAVENOUS
  Filled 2015-10-01: qty 2

## 2015-10-01 MED ORDER — INSULIN ASPART 100 UNIT/ML ~~LOC~~ SOLN
0.0000 [IU] | Freq: Three times a day (TID) | SUBCUTANEOUS | Status: DC
Start: 2015-10-02 — End: 2015-10-05
  Administered 2015-10-02: 2 [IU] via SUBCUTANEOUS
  Administered 2015-10-03: 3 [IU] via SUBCUTANEOUS
  Administered 2015-10-03 – 2015-10-04 (×3): 2 [IU] via SUBCUTANEOUS
  Administered 2015-10-04: 5 [IU] via SUBCUTANEOUS
  Administered 2015-10-05: 3 [IU] via SUBCUTANEOUS

## 2015-10-01 MED ORDER — METOCLOPRAMIDE HCL 5 MG/ML IJ SOLN
5.0000 mg | Freq: Four times a day (QID) | INTRAMUSCULAR | Status: DC
  Administered 2015-10-01 – 2015-10-02 (×3): 5 mg via INTRAVENOUS
  Filled 2015-10-01 (×4): qty 2

## 2015-10-01 MED ORDER — ACETAMINOPHEN 325 MG PO TABS
650.0000 mg | ORAL_TABLET | Freq: Four times a day (QID) | ORAL | Status: DC | PRN
  Administered 2015-10-01 – 2015-10-05 (×4): 650 mg via ORAL
  Filled 2015-10-01 (×4): qty 2

## 2015-10-01 MED ORDER — METOCLOPRAMIDE HCL 5 MG/ML IJ SOLN
10.0000 mg | Freq: Once | INTRAMUSCULAR | Status: AC
  Administered 2015-10-01: 10 mg via INTRAVENOUS
  Filled 2015-10-01: qty 2

## 2015-10-01 MED ORDER — ADULT MULTIVITAMIN W/MINERALS CH
1.0000 | ORAL_TABLET | Freq: Every day | ORAL | Status: DC
  Administered 2015-10-02 – 2015-10-05 (×4): 1 via ORAL
  Filled 2015-10-01 (×4): qty 1

## 2015-10-01 MED ORDER — INSULIN ASPART 100 UNIT/ML FLEXPEN: 10.0000 [IU] | PEN_INJECTOR | Freq: Three times a day (TID) | SUBCUTANEOUS | Status: DC

## 2015-10-01 NOTE — ED Notes (Signed)
Attempted to call report, spoke with Anderson Malta and she stated she was unaware of getting a patient and stated that she will call back

## 2015-10-01 NOTE — ED Notes (Signed)
Pt states her glucose continues to run in the 400s.  States she has been vomiting for 3 days.

## 2015-10-01 NOTE — ED Provider Notes (Signed)
CSN: [id number]     Arrival date & time 10/01/15  1147 History  By signing my name below, I, [name], attest that this documentation has been prepared under the direction and in the presence of Nat Christen, MD. Electronically Signed: [name], ED Scribe. 10/01/2015. 1:52 PM.   Chief Complaint  Patient presents with  . Hyperglycemia   The history is provided by the patient. No language interpreter was used.    HPI Comments: Alexandria Webster is a 50 y.o. female with PMHx IDDM who presents to the Emergency Department complaining of hyperglycemia for the past couple of days. She also complains of nausea and vomiting for the past 3 days. Pt states that her sugars typically run in the 240's but that it increases when she feels sick. She used to be on Levemir and began taking 30 units Toujeo at night since 08/29/2015. Pt used to take 20 units but had it increased to 30 units when she had no change in her glucose levels. Pt states she has not noticed a change from the Levemir to the [place]. Denies any other associated symptoms.    Past Medical History  Diagnosis Date  . Asthma   . Diabetes mellitus   . Hypertension   . PVD (peripheral vascular disease) ([place])   . DDD (degenerative disc disease), lumbar    Past Surgical History  Procedure Laterality Date  . Femoral artery stent  right leg  . Back surgery    . Coronary angioplasty with stent placement    . Coronary artery bypass graft     Family History  Problem Relation Age of Onset  . Stroke Mother   . Hypertension Mother   . Heart failure Father    Social History  Substance Use Topics  . Smoking status: Current Every Day Smoker -- 0.50 packs/day for 30 years    Types: Cigarettes  . Smokeless tobacco: Never Used  . Alcohol Use: No   OB History    Gravida Para Term Preterm AB TAB SAB Ectopic Multiple Living   2 1  1 1  1   1      Review of Systems A complete 10 system review of systems was obtained and all systems are  negative except as noted in the HPI and PMH.   Allergies  Review of patient's allergies indicates no known allergies.  Home Medications   Prior to Admission medications   Medication Sig Start Date End Date Taking? Authorizing Provider  aspirin EC 81 MG tablet Take 81 mg by mouth every morning.    Yes Historical Provider, MD  budesonide-formoterol (SYMBICORT) 160-4.5 MCG/ACT inhaler Inhale 2 puffs into the lungs 2 (two) times daily.   Yes Historical Provider, MD  Carboxymethylcellul-Glycerin (CLEAR EYES FOR DRY EYES OP) Place 2 drops into both eyes 2 (two) times daily.   Yes Historical Provider, MD  esomeprazole (NEXIUM) 20 MG capsule Take 20 mg by mouth daily at 12 noon.   Yes Historical Provider, MD  GARLIC PO Take 1 tablet by mouth daily.   Yes Historical Provider, MD  insulin aspart (NOVOLOG FLEXPEN) 100 UNIT/ML FlexPen Inject 10-16 Units into the skin 3 (three) times daily with meals. Patient taking differently: Inject 5-10 Units into the skin 3 (three) times daily with meals.  10/01/15  Yes Cassandria Anger, MD  Insulin Glargine (TOUJEO SOLOSTAR) 300 UNIT/ML SOPN Inject 30 Units into the skin at bedtime. 10/01/15  Yes Cassandria Anger, MD  lisinopril (PRINIVIL,ZESTRIL) 20 MG tablet  Take 20 mg by mouth daily.   Yes Historical Provider, MD  Multiple Vitamin (MULTIVITAMIN WITH MINERALS) TABS tablet Take 1 tablet by mouth daily.   Yes Historical Provider, MD  ondansetron (ZOFRAN ODT) 4 MG disintegrating tablet Take 1 tablet (4 mg total) by mouth every 8 (eight) hours as needed for nausea or vomiting. 09/14/15  Yes Kristen N Ward, DO  Blood Glucose Monitoring Suppl (ACCU-CHEK AVIVA) device Use as instructed 09/28/15   Cassandria Anger, MD  glucose blood (ACCU-CHEK AVIVA) test strip Test glucose 4 times a day 09/28/15   Cassandria Anger, MD  Insulin Pen Needle (B-D UF III MINI PEN NEEDLES) 31G X 5 MM MISC Use qhs 09/17/15   Cassandria Anger, MD  potassium chloride SA  (K-DUR,KLOR-CON) 20 MEQ tablet Take 2 tablets (40 mEq total) by mouth daily. Patient not taking: Reported on 09/17/2015 09/14/15 09/15/15  [name], MD   BP 162/87 mmHg  Pulse 68  Temp(Src) 98.6 F (37 C) (Oral)  Resp 14  Ht 5\' 4"  (1.626 m)  Wt 163 lb (73.936 kg)  BMI 27.97 kg/m2  SpO2 100%   Physical Exam  Constitutional: She is oriented to person, place, and time. She appears well-developed and well-nourished.  HENT:  Head: Normocephalic and atraumatic.  Eyes: Conjunctivae are normal.  Neck: Neck supple.  Cardiovascular: Normal rate and regular rhythm.   Pulmonary/Chest: Effort normal and breath sounds normal.  Abdominal: Soft. Bowel sounds are normal.  Musculoskeletal: Normal range of motion.  Neurological: She is alert and oriented to person, place, and time.  Skin: Skin is warm and dry.  Psychiatric: She has a normal mood and affect. Her behavior is normal.  Nursing note and vitals reviewed.     ED Course  Procedures (including critical care time)  DIAGNOSTIC STUDIES: Oxygen Saturation is 100% on RA, normal by my interpretation.    COORDINATION OF CARE: 1:51 PM-Discussed treatment plan which includes IV Fluids, antiemetics, and insulin with pt at bedside and pt agreed to plan.   Labs Review Labs Reviewed  BASIC METABOLIC PANEL - Abnormal; Notable for the following:    Glucose, Bld 469 (*)    All other components within normal limits  URINALYSIS, ROUTINE W REFLEX MICROSCOPIC (NOT AT [facility]) - Abnormal; Notable for the following:    Color, Urine STRAW (*)    Specific Gravity, Urine <1.005 (*)    Glucose, UA >1000 (*)    All other components within normal limits  URINE MICROSCOPIC-ADD ON - Abnormal; Notable for the following:    Squamous Epithelial / LPF 0-5 (*)    Bacteria, UA RARE (*)    All other components within normal limits  CBG MONITORING, ED - Abnormal; Notable for the following:    Glucose-Capillary 538 (*)    All other components within normal limits   CBG MONITORING, ED - Abnormal; Notable for the following:    Glucose-Capillary 222 (*)    All other components within normal limits  CBC    Imaging Review No results found. I have personally reviewed and evaluated these images and lab results as part of my medical decision-making.   EKG Interpretation None      MDM   Final diagnoses:  Gastritis  Diabetes mellitus type 2 in nonobese [facility])  Patient is a poorly controlled diabetic.  She continues to vomit despite IV fluids, IV Zofran, IV Phenergan, IV Pepcid.  Will Rx IV Reglan and admit to observation.  I personally performed the services described  in this documentation, which was scribed in my presence. The recorded information has been reviewed and is accurate.       Nat Christen, MD 10/01/15 [phone]

## 2015-10-01 NOTE — H&P (Signed)
History and Physical    Alexandria Webster [id number] DOB: [birth year] DOA: 10/01/2015  Referring MD/NP/PA: Dr. Lacinda Axon PCP: Purvis Kilts, MD Outpatient Specialists: Dr. Dorris Fetch Patient coming from: home  Chief Complaint: vomiting  HPI: Alexandria Webster is a 50 y.o. female with medical history significant of uncontrolled DM and medical non-compliance.  She has had 15 ER visits in 6 months, virtually all related to her DM.  For the past 3 days,she states she has been vomiting all her stomach contents 5 minutes after she eats ANYTHING.  She states she is able to swallow the food but it will only stay down 5 minutes.  She denies diarrhea, denies sick contacts.  She states she has taken her insulin as prescribed by Dr. Dorris Fetch.  She does not eat often as she is not hungry and she denies eating sugary drinks or eating food that is not on her diabetic diet.    ED Course: In the ER, her blood sugar was elevated to > 400.  All other labs ok.  She was given multiple medications to control nausea: zofran, phenergan and patient was unable to keep any food down.  Hospitalist were asked to obs overnight for uncontrolled N/V.    Review of Systems: all systems reviewed, negative unless stated above in HPI   Past Medical History  Diagnosis Date  . Asthma   . Diabetes mellitus   . Hypertension   . PVD (peripheral vascular disease) ([place])   . DDD (degenerative disc disease), lumbar     Past Surgical History  Procedure Laterality Date  . Femoral artery stent  right leg  . Back surgery    . Coronary angioplasty with stent placement    . Coronary artery bypass graft       reports that she has been smoking Cigarettes.  She has a 15 pack-year smoking history. She has never used smokeless tobacco. She reports that she does not drink alcohol or use illicit drugs.  No Known Allergies  Family History  Problem Relation Age of Onset  . Stroke Mother   . Hypertension Mother   . Heart failure Father       Prior to Admission medications   Medication Sig Start Date End Date Taking? Authorizing Provider  aspirin EC 81 MG tablet Take 81 mg by mouth every morning.    Yes Historical Provider, MD  budesonide-formoterol (SYMBICORT) 160-4.5 MCG/ACT inhaler Inhale 2 puffs into the lungs 2 (two) times daily.   Yes Historical Provider, MD  Carboxymethylcellul-Glycerin (CLEAR EYES FOR DRY EYES OP) Place 2 drops into both eyes 2 (two) times daily.   Yes Historical Provider, MD  esomeprazole (NEXIUM) 20 MG capsule Take 20 mg by mouth daily at 12 noon.   Yes Historical Provider, MD  GARLIC PO Take 1 tablet by mouth daily.   Yes Historical Provider, MD  insulin aspart (NOVOLOG FLEXPEN) 100 UNIT/ML FlexPen Inject 10-16 Units into the skin 3 (three) times daily with meals. Patient taking differently: Inject 5-10 Units into the skin 3 (three) times daily with meals.  10/01/15  Yes Cassandria Anger, MD  Insulin Glargine (TOUJEO SOLOSTAR) 300 UNIT/ML SOPN Inject 30 Units into the skin at bedtime. 10/01/15  Yes Cassandria Anger, MD  lisinopril (PRINIVIL,ZESTRIL) 20 MG tablet Take 20 mg by mouth daily.   Yes Historical Provider, MD  Multiple Vitamin (MULTIVITAMIN WITH MINERALS) TABS tablet Take 1 tablet by mouth daily.   Yes Historical Provider, MD  ondansetron (ZOFRAN ODT) 4 MG  disintegrating tablet Take 1 tablet (4 mg total) by mouth every 8 (eight) hours as needed for nausea or vomiting. 09/14/15  Yes Kristen N Ward, DO  Blood Glucose Monitoring Suppl (ACCU-CHEK AVIVA) device Use as instructed 09/28/15   Cassandria Anger, MD  glucose blood (ACCU-CHEK AVIVA) test strip Test glucose 4 times a day 09/28/15   Cassandria Anger, MD  Insulin Pen Needle (B-D UF III MINI PEN NEEDLES) 31G X 5 MM MISC Use qhs 09/17/15   Cassandria Anger, MD  potassium chloride SA (K-DUR,KLOR-CON) 20 MEQ tablet Take 2 tablets (40 mEq total) by mouth daily. Patient not taking: Reported on 09/17/2015 09/14/15 09/15/15  [name], MD     Physical Exam: Filed Vitals:   10/01/15 1157 10/01/15 1359 10/01/15 1829  BP: 138/65 162/87 160/96  Pulse: 106 68 97  Temp: 98.6 F (37 C)    TempSrc: Oral    Resp: 18 14 18   Height: 5\' 4"  (1.626 m)    Weight: 73.936 kg (163 lb)    SpO2: 100% 100% 99%      Constitutional: NAD, calm, comfortable Filed Vitals:   10/01/15 1157 10/01/15 1359 10/01/15 1829  BP: 138/65 162/87 160/96  Pulse: 106 68 97  Temp: 98.6 F (37 C)    TempSrc: Oral    Resp: 18 14 18   Height: 5\' 4"  (1.626 m)    Weight: 73.936 kg (163 lb)    SpO2: 100% 100% 99%   Eyes: PERRL, lids and conjunctivae normal ENMT: Mucous membranes are dry. Posterior pharynx clear of any exudate or lesions.Normal dentition.  Neck: normal, supple, no masses, no thyromegaly Respiratory: clear to auscultation bilaterally, no wheezing, no crackles. Normal respiratory effort. No accessory muscle use.  Cardiovascular: Regular rate and rhythm, no murmurs / rubs / gallops. No extremity edema. 2+ pedal pulses. No carotid bruits.  Abdomen: no tenderness, no masses palpated. No hepatosplenomegaly. Bowel sounds positive.  Musculoskeletal: no clubbing / cyanosis. No joint deformity upper and lower extremities. Good ROM, no contractures. Normal muscle tone.  Skin: no rashes, lesions, ulcers. No induration Neurologic: CN 2-12 grossly intact. Sensation intact, DTR normal. Strength 5/5 in all 4.  Psychiatric: Normal judgment and insight. Alert and oriented x 3. Normal mood.    Labs on Admission: I have personally reviewed following labs and imaging studies  CBC:  Recent Labs Lab 09/27/15 1310 10/01/15 1254  WBC 10.8* 6.1  NEUTROABS 8.7*  --   HGB 15.4* 14.5  HCT 44.5 42.7  MCV 97.2 97.5  PLT 245 [id number]   Basic Metabolic Panel:  Recent Labs Lab 09/27/15 1310 10/01/15 1254  NA 134* 136  K 3.3* 3.6  CL 98* 103  CO2 26 24  GLUCOSE 453* 469*  BUN 16 10  CREATININE 0.65 0.70  CALCIUM 9.4 9.3   GFR: Estimated Creatinine  Clearance: 82 mL/min (by C-G formula based on Cr of 0.7). Liver Function Tests:  Recent Labs Lab 09/27/15 1310  AST 17  ALT 14  ALKPHOS 70  BILITOT 0.4  PROT 8.0  ALBUMIN 4.1   No results for input(s): LIPASE, AMYLASE in the last 168 hours. No results for input(s): AMMONIA in the last 168 hours. Coagulation Profile: No results for input(s): INR, PROTIME in the last 168 hours. Cardiac Enzymes: No results for input(s): CKTOTAL, CKMB, CKMBINDEX, TROPONINI in the last 168 hours. BNP (last 3 results) No results for input(s): PROBNP in the last 8760 hours. HbA1C: No results for input(s): HGBA1C in the last 72  hours. CBG:  Recent Labs Lab 09/27/15 1243 09/27/15 1411 09/27/15 1527 10/01/15 1200 10/01/15 1610  GLUCAP 454* 316* 160* 538* 222*   Lipid Profile: No results for input(s): CHOL, HDL, LDLCALC, TRIG, CHOLHDL, LDLDIRECT in the last 72 hours. Thyroid Function Tests: No results for input(s): TSH, T4TOTAL, FREET4, T3FREE, THYROIDAB in the last 72 hours. Anemia Panel: No results for input(s): VITAMINB12, FOLATE, FERRITIN, TIBC, IRON, RETICCTPCT in the last 72 hours. Urine analysis:    Component Value Date/Time   COLORURINE STRAW* 10/01/2015 1200   APPEARANCEUR CLEAR 10/01/2015 1200   LABSPEC <1.005* 10/01/2015 1200   PHURINE 5.0 10/01/2015 1200   GLUCOSEU >1000* 10/01/2015 1200   HGBUR NEGATIVE 10/01/2015 1200   BILIRUBINUR NEGATIVE 10/01/2015 1200   KETONESUR NEGATIVE 10/01/2015 1200   PROTEINUR NEGATIVE 10/01/2015 1200   UROBILINOGEN 0.2 12/21/2014 0902   NITRITE NEGATIVE 10/01/2015 1200   LEUKOCYTESUR NEGATIVE 10/01/2015 1200   Sepsis Labs: Invalid input(s): PROCALCITONIN, LACTICIDVEN No results found for this or any previous visit (from the past 240 hour(s)).   Radiological Exams on Admission: No results found.  EKG: Independently reviewed. pending  Assessment/Plan Active Problems:   Essential hypertension, benign   Leukocytosis   Type 2 diabetes  mellitus, uncontrolled ([place])   Personal history of noncompliance with medical treatment, presenting hazards to health   Gastroparesis due to DM [facility])   Presumed gastroparesis -start reglan, get EKG for Qtc -will need outpatient diabetic emptying study -denies alcohol and pain medications  DM type 2- uncontrolled -SSI -tujeo and novolog with meals -check HgbA1C  HTN -continue lisinopril    DVT prophylaxis: lovenox Code Status: full Family Communication: patient Disposition Plan: home in AM Consults called: none Admission status: obs   [place] DO [facility] Pager [phone]  If 7PM-7AM, please contact night-coverage [email or web address] Password [facility]  10/01/2015, 6:32 PM

## 2015-10-01 NOTE — ED Notes (Signed)
Attempted to call report to receiving nurse, was told that bed had not been approved.  Ask for receiving nurse to call for report

## 2015-10-01 NOTE — Progress Notes (Signed)
Subjective:    Patient ID: Alexandria Webster, female    DOB: May 05, 1965. Patient is being seen in f/u  for management of diabetes requested by  Purvis Kilts, MD  Past Medical History  Diagnosis Date  . Asthma   . Diabetes mellitus   . Hypertension   . PVD (peripheral vascular disease) ([place])   . DDD (degenerative disc disease), lumbar    Past Surgical History  Procedure Laterality Date  . Femoral artery stent  right leg  . Back surgery    . Coronary angioplasty with stent placement    . Coronary artery bypass graft     Social History   Social History  . Marital Status: Married    Spouse Name: N/A  . Number of Children: N/A  . Years of Education: N/A   Social History Main Topics  . Smoking status: Current Every Day Smoker -- 0.50 packs/day for 30 years    Types: Cigarettes  . Smokeless tobacco: Never Used  . Alcohol Use: No  . Drug Use: No  . Sexual Activity: Yes    Birth Control/ Protection: None   Other Topics Concern  . Not on file   Social History Narrative   Outpatient Encounter Prescriptions as of 10/01/2015  Medication Sig  . aspirin EC 81 MG tablet Take 81 mg by mouth every morning.   . Blood Glucose Monitoring Suppl (ACCU-CHEK AVIVA) device Use as instructed  . budesonide-formoterol (SYMBICORT) 160-4.5 MCG/ACT inhaler Inhale 2 puffs into the lungs 2 (two) times daily.  . Carboxymethylcellul-Glycerin (CLEAR EYES FOR DRY EYES OP) Place 2 drops into both eyes 2 (two) times daily.  Marland Kitchen esomeprazole (NEXIUM) 20 MG capsule Take 20 mg by mouth daily at 12 noon.  Marland Kitchen GARLIC PO Take 1 tablet by mouth daily.  Marland Kitchen glucose blood (ACCU-CHEK AVIVA) test strip Test glucose 4 times a day  . insulin aspart (NOVOLOG FLEXPEN) 100 UNIT/ML FlexPen Inject 10-16 Units into the skin 3 (three) times daily with meals.  . Insulin Glargine (TOUJEO SOLOSTAR) 300 UNIT/ML SOPN Inject 30 Units into the skin at bedtime.  . Insulin Pen Needle (B-D UF III MINI PEN NEEDLES) 31G X 5 MM MISC  Use qhs  . lisinopril (PRINIVIL,ZESTRIL) 20 MG tablet Take 20 mg by mouth daily.  . Multiple Vitamin (MULTIVITAMIN WITH MINERALS) TABS tablet Take 1 tablet by mouth daily.  . ondansetron (ZOFRAN ODT) 4 MG disintegrating tablet Take 1 tablet (4 mg total) by mouth every 8 (eight) hours as needed for nausea or vomiting.  . [DISCONTINUED] insulin aspart (NOVOLOG FLEXPEN) 100 UNIT/ML FlexPen Inject 5-11 Units into the skin 3 (three) times daily with meals.  . [DISCONTINUED] Insulin Glargine (TOUJEO SOLOSTAR) 300 UNIT/ML SOPN Inject 30 Units into the skin at bedtime. (Patient taking differently: Inject 5 Units into the skin 3 (three) times daily after meals. )  . potassium chloride SA (K-DUR,KLOR-CON) 20 MEQ tablet Take 2 tablets (40 mEq total) by mouth daily. (Patient not taking: Reported on 09/17/2015)   No facility-administered encounter medications on file as of 10/01/2015.   ALLERGIES: No Known Allergies VACCINATION STATUS:  There is no immunization history on file for this patient.  Hyperglycemia Associated symptoms include fatigue. Pertinent negatives include no arthralgias, chest pain, chills, coughing, fever, headaches, myalgias, nausea, rash or vomiting.  Diabetes She presents for her follow-up diabetic visit. She has type 2 diabetes mellitus. Onset time: She was diagnosed at approximate age of 16 years. Her disease course has been worsening (  She was seen in the emergency room with hyperglycemia and near DKA yesterday. ER doctor called on behalf of patient. And she was offered her walking visit today. Her last visit with me was more than 3 years ago.). There are no hypoglycemic associated symptoms. Pertinent negatives for hypoglycemia include no confusion, headaches, pallor or seizures. Associated symptoms include fatigue, polydipsia and polyuria. Pertinent negatives for diabetes include no chest pain and no polyphagia. There are no hypoglycemic complications. Symptoms are worsening. Diabetic  complications include heart disease and nephropathy. Risk factors for coronary artery disease include diabetes mellitus, dyslipidemia, sedentary lifestyle and tobacco exposure. Current diabetic treatment includes insulin injections. She is compliant with treatment none of the time (She did not keep her last appointment, came with no meter nor log. She visited emergency room since last visit, was started on NovoLog 70/30 30 units twice a day.). Insulin injections are given by patient. Her weight is stable. She is following a generally unhealthy diet. When asked about meal planning, she reported none. She has not had a previous visit with a dietitian. She never (She has fracture of right lower extremity currently in cost.) participates in exercise. She monitors blood glucose at home 1-2 x per day. There is no compliance with monitoring of blood glucose. Home blood sugar record trend: She does not monitor blood glucose for lack of supplies. Her overall blood glucose range is >200 mg/dl. (Despite strict diabetes management plan given to her, she did not monitor adequately . she monitored only 6 times in the last 7 days rather randomly when she was supposed to test 4 times a day before meals and at bedtime. She did not bring a log to show her utilization of insulin.  ) An ACE inhibitor/angiotensin II receptor blocker is being taken.  Hypertension This is a chronic problem. The current episode started more than 1 year ago. The problem is controlled. Pertinent negatives include no chest pain, headaches, palpitations or shortness of breath. Risk factors for coronary artery disease include dyslipidemia, diabetes mellitus, smoking/tobacco exposure and sedentary lifestyle. Past treatments include ACE inhibitors. Hypertensive end-organ damage includes kidney disease and CAD/MI.     Review of Systems  Constitutional: Positive for fatigue. Negative for fever, chills and unexpected weight change.  HENT: Negative for  trouble swallowing and voice change.   Eyes: Negative for visual disturbance.  Respiratory: Negative for cough, shortness of breath and wheezing.   Cardiovascular: Negative for chest pain, palpitations and leg swelling.  Gastrointestinal: Negative for nausea, vomiting and diarrhea.  Endocrine: Positive for polydipsia and polyuria. Negative for cold intolerance, heat intolerance and polyphagia.  Musculoskeletal: Positive for gait problem. Negative for myalgias and arthralgias.       She has fracture of right lower extremity in the past.  Skin: Negative for color change, pallor, rash and wound.  Neurological: Negative for seizures and headaches.  Psychiatric/Behavioral: Negative for suicidal ideas and confusion.    Objective:    BP 150/92 mmHg  Pulse 97  Resp 18  Ht 5\' 4"  (1.626 m)  Wt 135 lb (61.236 kg)  BMI 23.16 kg/m2  SpO2 99%  Wt Readings from Last 3 Encounters:  10/01/15 163 lb (73.936 kg)  10/01/15 135 lb (61.236 kg)  09/28/15 136 lb (61.689 kg)    Physical Exam  Constitutional: She is oriented to person, place, and time. She appears well-developed.  HENT:  Head: Normocephalic and atraumatic.  Eyes: EOM are normal.  Neck: Normal range of motion. Neck supple. No tracheal deviation  present. No thyromegaly present.  Cardiovascular: Normal rate and regular rhythm.   Pulmonary/Chest: Effort normal and breath sounds normal.  Abdominal: Soft. Bowel sounds are normal. There is no tenderness. There is no guarding.  Musculoskeletal: She exhibits no edema.  Neurological: She is alert and oriented to person, place, and time. She has normal reflexes. No cranial nerve deficit. Coordination normal.  Skin: Skin is warm and dry. No rash noted. No erythema. No pallor.  Psychiatric: She has a normal mood and affect. Judgment normal.     CMP ( most recent) CMP     Component Value Date/Time   NA 134* 09/27/2015 1310   K 3.3* 09/27/2015 1310   CL 98* 09/27/2015 1310   CO2 26  09/27/2015 1310   GLUCOSE 453* 09/27/2015 1310   BUN 16 09/27/2015 1310   CREATININE 0.65 09/27/2015 1310   CALCIUM 9.4 09/27/2015 1310   PROT 8.0 09/27/2015 1310   ALBUMIN 4.1 09/27/2015 1310   AST 17 09/27/2015 1310   ALT 14 09/27/2015 1310   ALKPHOS 70 09/27/2015 1310   BILITOT 0.4 09/27/2015 1310   GFRNONAA >60 09/27/2015 1310   GFRAA >60 09/27/2015 1310    Diabetic Labs (most recent): Lab Results  Component Value Date   HGBA1C 12.5* 11/07/2012     Assessment & Plan:   1. Uncontrolled type 2 diabetes mellitus with Cardiac complication, with long-term current use of insulin ([place])  - Patient has currently uncontrolled symptomatic type 2 DM since  50 years of age,  with  No recent A1c however her last documented A1c was 12.5% in 2014 .   Her diabetes is complicated by  coronary artery disease, chronic kidney disease, noncompliance, and patient remains at a high risk for more acute and chronic complications of diabetes which include CAD, CVA, CKD, retinopathy, and neuropathy. These are all discussed in detail with the patient.  - I have counseled the patient on diet management by adopting a carbohydrate restricted/protein rich diet.  - Suggestion is made for patient to avoid simple carbohydrates   from their diet including Cakes , Desserts, Ice Cream,  Soda (  diet and regular) , Sweet Tea , Candies,  Chips, Cookies, Artificial Sweeteners,   and "Sugar-free" Products . This will help patient to have stable blood glucose profile and potentially avoid unintended weight gain.  - I encouraged the patient to switch to  unprocessed or minimally processed complex starch and increased protein intake (animal or plant source), fruits, and vegetables.  - Patient is advised to stick to a routine mealtimes to eat 3 meals  a day and avoid unnecessary snacks ( to snack only to correct hypoglycemia).  - The patient will be scheduled with Jearld Fenton, RDN, CDE for individualized DM  education.  - Emphasizing the need for compliance, I have approached patient with the following individualized plan to manage diabetes and patient agrees:   -  She came with a meter showing 6 readings in the last 7 days average is 261.  This is not adequate monitoring for her to use insulin safely.  - Unfortunately patient remains alarmingly noncompliant. She has not started most of the plans I gave her last visit. - I urged her to resume the plan I gave her last visit and reemphasized today. - I have initiated monitoring of blood glucose before meals and at bedtime. I have advised her to continue Toujeo 30 units daily at bedtime,  NovoLog 10 units 3 times a day before meals plus  correction.  - I advised her not to take NovoLog 70/30 while taking Toujeo.  - Patient is warned not to take insulin without proper monitoring per orders. -Adjustment parameters are given for hypo and hyperglycemia in writing. -Patient is encouraged to call clinic for blood glucose levels less than 70 or above 200 mg /dl. - Her renal function is favorable, she will be considered for low-dose metformin next visit.  -She is not a candidate for SGLT 2 inhibitors,  incretin therapy. - Patient specific target  A1c;  LDL, HDL, Triglycerides, and  Waist Circumference were discussed in detail.  2) BP/HTN: uncontrolled. Continue current medications including ACEI/ARB. 3) Lipids/HPL: lipid panel unknown, she is not on statins. I will obtain lipid panel on subsequent visits.  4)  Weight/Diet: CDE Consult will be initiated , exercise, and detailed carbohydrates information provided.  5) Chronic Care/Health Maintenance:  -Patient is on ACEI/ARB and encouraged to continue to follow up with Ophthalmology, Podiatrist at least yearly or according to recommendations, and advised to   quit stay away from smoking. I have recommended yearly flu vaccine and pneumonia vaccination at least every 5 years; moderate intensity exercise for up  to 150 minutes weekly; and  sleep for at least 7 hours a day.  - 25 minutes of time was spent on the care of this patient , 50% of which was applied for counseling on diabetes complications and their preventions.  - Patient to bring meter and  blood glucose logs during their next visit.   - I advised patient to maintain close follow up with Purvis Kilts, MD for primary care needs.  Follow up plan: - Return in about 1 week (around 10/08/2015) for follow up with meter and logs- no labs.  Glade Lloyd, MD Phone: [phone]  Fax: [phone]   10/01/2015, 1:23 PM

## 2015-10-02 DIAGNOSIS — E1143 Type 2 diabetes mellitus with diabetic autonomic (poly)neuropathy: Secondary | ICD-10-CM

## 2015-10-02 DIAGNOSIS — D72829 Elevated white blood cell count, unspecified: Secondary | ICD-10-CM

## 2015-10-02 DIAGNOSIS — K3184 Gastroparesis: Secondary | ICD-10-CM

## 2015-10-02 DIAGNOSIS — I10 Essential (primary) hypertension: Secondary | ICD-10-CM | POA: Diagnosis not present

## 2015-10-02 DIAGNOSIS — Z9119 Patient's noncompliance with other medical treatment and regimen: Secondary | ICD-10-CM

## 2015-10-02 LAB — CBC
HCT: 37.3 % (ref 36.0–46.0)
Hemoglobin: 12.5 g/dL (ref 12.0–15.0)
MCH: 32.8 pg (ref 26.0–34.0)
MCHC: 33.5 g/dL (ref 30.0–36.0)
MCV: 97.9 fL (ref 78.0–100.0)
Platelets: 221 K/uL (ref 150–400)
RBC: 3.81 MIL/uL — ABNORMAL LOW (ref 3.87–5.11)
RDW: 12.7 % (ref 11.5–15.5)
WBC: 6.2 K/uL (ref 4.0–10.5)

## 2015-10-02 LAB — COMPREHENSIVE METABOLIC PANEL WITH GFR
ALT: 10 U/L — ABNORMAL LOW (ref 14–54)
AST: 13 U/L — ABNORMAL LOW (ref 15–41)
Albumin: 3 g/dL — ABNORMAL LOW (ref 3.5–5.0)
Alkaline Phosphatase: 46 U/L (ref 38–126)
Anion gap: 6 (ref 5–15)
BUN: 9 mg/dL (ref 6–20)
CO2: 27 mmol/L (ref 22–32)
Calcium: 8.6 mg/dL — ABNORMAL LOW (ref 8.9–10.3)
Chloride: 112 mmol/L — ABNORMAL HIGH (ref 101–111)
Creatinine, Ser: 0.49 mg/dL (ref 0.44–1.00)
GFR calc Af Amer: >60 mL/min (ref >60)
GFR calc non Af Amer: >60 mL/min (ref >60)
Glucose, Bld: 84 mg/dL (ref 65–99)
Potassium: 3.6 mmol/L (ref 3.5–5.1)
Sodium: 145 mmol/L (ref 135–145)
Total Bilirubin: 0.5 mg/dL (ref 0.3–1.2)
Total Protein: 6.1 g/dL — ABNORMAL LOW (ref 6.5–8.1)

## 2015-10-02 LAB — GLUCOSE, CAPILLARY
GLUCOSE-CAPILLARY: 107 mg/dL — AB (ref 65–99)
GLUCOSE-CAPILLARY: 303 mg/dL — AB (ref 65–99)
Glucose-Capillary: 180 mg/dL — ABNORMAL HIGH (ref 65–99)

## 2015-10-02 MED ORDER — SODIUM CHLORIDE 0.9 % IV SOLN
INTRAVENOUS | Status: DC
  Administered 2015-10-02: 13:00:00 via INTRAVENOUS

## 2015-10-02 MED ORDER — LIVING WELL WITH DIABETES BOOK
Freq: Once | Status: AC
  Administered 2015-10-02: 13:00:00
  Filled 2015-10-02: qty 1

## 2015-10-02 MED ORDER — MAGNESIUM CITRATE PO SOLN
1.0000 | Freq: Once | ORAL | Status: AC
  Administered 2015-10-02: 1 via ORAL
  Filled 2015-10-02: qty 296

## 2015-10-02 MED ORDER — METOCLOPRAMIDE HCL 10 MG PO TABS
5.0000 mg | ORAL_TABLET | Freq: Three times a day (TID) | ORAL | Status: DC
  Administered 2015-10-02 – 2015-10-05 (×12): 5 mg via ORAL
  Filled 2015-10-02 (×12): qty 1

## 2015-10-02 MED ORDER — FAMOTIDINE 20 MG PO TABS
10.0000 mg | ORAL_TABLET | Freq: Every day | ORAL | Status: DC
  Administered 2015-10-02 – 2015-10-05 (×4): 10 mg via ORAL
  Filled 2015-10-02 (×4): qty 1

## 2015-10-02 NOTE — Progress Notes (Addendum)
Inpatient Diabetes Program Recommendations  AACE/ADA: New Consensus Statement on Inpatient Glycemic Control (2015)  Target Ranges:  Prepandial:   less than 140 mg/dL      Peak postprandial:   less than 180 mg/dL (1-2 hours)      Critically ill patients:  140 - 180 mg/dL  Results for ARSULA, JAVINS (MRN [id number]) as of 10/02/2015 09:55  Ref. Range 10/01/2015 12:00 10/01/2015 16:10 10/01/2015 22:27 10/02/2015 07:48  Glucose-Capillary Latest Ref Range: 65-99 mg/dL 538 (HH) 222 (H) 119 (H) 107 (H)   Review of Glycemic Control  Diabetes history: DM2 Outpatient Diabetes medications: Toujeo 30 units QHS, Novolog 5-10 units TID with meals Current orders for Inpatient glycemic control: Novolog 0-9 units TID with meals, Novolog 0-5 units QHS, Novolog 6 units TID with meals for meal coverage  Inpatient Diabetes Program Recommendations: Insulin - Basal: Patient states she last took Toujeo 30 units on 09/30/15 at bedtime. Toujeo tends to work longer than 24 hours; therefore anticipate glucose to continue to rise since no basal insulin was given last night. Please consider ordering Lantus 10 units QHS and continue to adjust as needed.   Addendum 10/02/15@13 :33-Spoke with patient about diabetes and home regimen for diabetes control. Patient reports that she is followed by Dr. Dorris Fetch for diabetes management and currently she takes Toujeo (pen) 30 units QHS and Novolog (pen) 5-10 units TID with meals (takes Novolog 5 units if glucose under 300 mg/dl and takes 10 units if glucose over 300 mg/dl) as an outpatient for diabetes control. Patient reports that she is taking insulin as prescribed and that she last seen Dr. Dorris Fetch on 10/01/15 (same day as admitted to the hospital). Patient reports that over the past month she was switched from Levemir and Regular to [facility] and Novolog by Dr. Dorris Fetch.  Patient states that she checks her glucose 1-3 times per day and she reports that it has been running high for the past week or two.  Patient reports that Dr. Dorris Fetch wants her to check her glucose 3-4 times a day and to write it down but patient states that "it is hard to remember to check my sugar that often and I have other things I need to be doing. Plus I can not read or write very good so I do the best I can."  Discussed glucose and A1C goals. Discussed importance of checking CBGs and maintaining good CBG control to prevent long-term and short-term complications. Explained how hyperglycemia leads to damage within blood vessels which lead to the common complications seen with uncontrolled diabetes. Stressed to the patient the importance of improving glycemic control to prevent further complications from uncontrolled diabetes. Discussed impact of nutrition, exercise, stress, sickness, and medications on diabetes control.Explained to patient that the only way Dr. Dorris Fetch was going to know how to help her get her diabetes controlled was to look at her glucose trends to determine what insulin adjustments need to be made.  Stressed the importance of working with Dr. Dorris Fetch and following his advice in order to get diabetes better controlled. Encouraged patient to check her glucose 3-4 times per day (before meals and at bedtime) and to keep a log book of glucose readings and insulin taken which she will need to take to doctor appointments. Patient verbalized understanding of information discussed and she states that she has no further questions at this time related to diabetes.  Thanks, Barnie Alderman, RN, MSN, CDE Diabetes Coordinator Inpatient Diabetes Program [phone] (Team Pager from [place] to [place]) [phone] (  [facility] office) [phone] [facility] office) [phone] [facility] office)

## 2015-10-02 NOTE — Plan of Care (Signed)
Problem: Nutrition: Goal: Adequate nutrition will be maintained Outcome: Progressing Diet advanced throughout day  Problem: Bowel/Gastric: Goal: Will not experience complications related to bowel motility Outcome: Progressing Had mag citrate today. One small BM per pt

## 2015-10-02 NOTE — Progress Notes (Signed)
PROGRESS NOTE    Alexandria Webster  [id number] DOB: 1965-11-07 DOA: 10/01/2015 PCP: Purvis Kilts, MD    Brief Narrative:  50 y.o. female with medical history significant of uncontrolled DM and medical non-compliance. She has had 15 ER visits in 6 months, virtually all related to her DM. For the past 3 days,she states she has been vomiting all her stomach contents 5 minutes after she eats ANYTHING. She states she is able to swallow the food but it will only stay down 5 minutes. She denies diarrhea, denies sick contacts. She states she has taken her insulin as prescribed by Dr. Dorris Fetch. She does not eat often as she is not hungry and she denies eating sugary drinks or eating food that is not on her diabetic diet.   Assessment & Plan:   Principal Problem:   Gastroparesis due to DM [facility]) Active Problems:   Essential hypertension, benign   Leukocytosis   Type 2 diabetes mellitus, uncontrolled ([place])   Personal history of noncompliance with medical treatment, presenting hazards to health   Presumed gastroparesis -started reglan -Recommend outpatient diabetic emptying study -Patient reports history of constipation. Recent bowel movement noted be quite hard small Continue cathartics as tolerated -Advance diet as tolerated. Overall improving  DM type 2- uncontrolled -Continue on SSI -tujeo and novolog with meals  HTN -continue lisinopril   DVT prophylaxis: Lovenox subQ Code Status: Full Family Communication: Pt in room, family not at bedside Disposition Plan: Anticipate home when able to tolerate soft diet   Consultants:     Procedures:     Antimicrobials:      Subjective: Reports feeling better today.  Objective: Filed Vitals:   10/01/15 1829 10/01/15 1841 10/02/15 0436 10/02/15 1426  BP: 160/96 170/87 124/71 145/58  Pulse: 97 87 69 84  Temp:  98 F (36.7 C) 97.5 F (36.4 C) 97.9 F (36.6 C)  TempSrc:  Oral Oral Oral  Resp: 18 16 18 20     Height:      Weight:      SpO2: 99% 100% 100% 100%    Intake/Output Summary (Last 24 hours) at 10/02/15 1712 Last data filed at 10/02/15 1427  Gross per 24 hour  Intake 3237.5 ml  Output   2451 ml  Net  786.5 ml   Filed Weights   10/01/15 1157  Weight: 73.936 kg (163 lb)    Examination:  General exam: Appears calm and comfortable  Respiratory system: Clear to auscultation. Respiratory effort normal. Cardiovascular system: S1 & S2 heard, RRR. No JVD Gastrointestinal system: Abdomen is nondistended, soft and nontender. No organomegaly or masses felt. Normal bowel sounds heard. Central nervous system: Alert and oriented. No focal neurological deficits. Extremities: Symmetric 5 x 5 power. Skin: No rashes, lesions or ulcers Psychiatry: Judgement and insight appear normal. Mood & affect appropriate.     Data Reviewed: I have personally reviewed following labs and imaging studies  CBC:  Recent Labs Lab 09/27/15 1310 10/01/15 1254 10/02/15 0428  WBC 10.8* 6.1 6.2  NEUTROABS 8.7*  --   --   HGB 15.4* 14.5 12.5  HCT 44.5 42.7 37.3  MCV 97.2 97.5 97.9  PLT 245 239 [id number]   Basic Metabolic Panel:  Recent Labs Lab 09/27/15 1310 10/01/15 1254 10/02/15 0428  NA 134* 136 145  K 3.3* 3.6 3.6  CL 98* 103 112*  CO2 26 24 27   GLUCOSE 453* 469* 84  BUN 16 10 9   CREATININE 0.65 0.70 0.49  CALCIUM 9.4 9.3  8.6*   GFR: Estimated Creatinine Clearance: 82 mL/min (by C-G formula based on Cr of 0.49). Liver Function Tests:  Recent Labs Lab 09/27/15 1310 10/02/15 0428  AST 17 13*  ALT 14 10*  ALKPHOS 70 46  BILITOT 0.4 0.5  PROT 8.0 6.1*  ALBUMIN 4.1 3.0*   No results for input(s): LIPASE, AMYLASE in the last 168 hours. No results for input(s): AMMONIA in the last 168 hours. Coagulation Profile: No results for input(s): INR, PROTIME in the last 168 hours. Cardiac Enzymes: No results for input(s): CKTOTAL, CKMB, CKMBINDEX, TROPONINI in the last 168 hours. BNP (last  3 results) No results for input(s): PROBNP in the last 8760 hours. HbA1C: No results for input(s): HGBA1C in the last 72 hours. CBG:  Recent Labs Lab 10/01/15 1200 10/01/15 1610 10/01/15 2227 10/02/15 0748 10/02/15 1122  GLUCAP 538* 222* 119* 107* 180*   Lipid Profile: No results for input(s): CHOL, HDL, LDLCALC, TRIG, CHOLHDL, LDLDIRECT in the last 72 hours. Thyroid Function Tests: No results for input(s): TSH, T4TOTAL, FREET4, T3FREE, THYROIDAB in the last 72 hours. Anemia Panel: No results for input(s): VITAMINB12, FOLATE, FERRITIN, TIBC, IRON, RETICCTPCT in the last 72 hours. Sepsis Labs: No results for input(s): PROCALCITON, LATICACIDVEN in the last 168 hours.  No results found for this or any previous visit (from the past 240 hour(s)).       Radiology Studies: No results found.      Scheduled Meds: . aspirin EC  81 mg Oral Daily  . enoxaparin (LOVENOX) injection  40 mg Subcutaneous Q24H  . famotidine  20 mg Intravenous Once  . insulin aspart  0-5 Units Subcutaneous QHS  . insulin aspart  0-9 Units Subcutaneous TID WC  . insulin aspart  6 Units Subcutaneous TID WC  . lisinopril  20 mg Oral Daily  . metoCLOPramide (REGLAN) injection  5 mg Intravenous Q6H  . mometasone-formoterol  2 puff Inhalation BID  . multivitamin with minerals  1 tablet Oral Daily  . oxyCODONE  5 mg Oral Once  . pantoprazole  40 mg Oral Daily   Continuous Infusions:      Tr[place]us Mc[place]erry, Orpah Melter, MD [facility] Pager [phone]  If 7PM-7AM, please contact night-coverage [email or web address] Password [email or web address] 10/02/2015, 5:12 PM

## 2015-10-02 NOTE — Plan of Care (Signed)
Problem: Food- and Nutrition-Related Knowledge Deficit (NB-1.1) Goal: Nutrition education Formal process to instruct or train a patient/client in a skill or to impart knowledge to help patients/clients voluntarily manage or modify food choices and eating behavior to maintain or improve health. Outcome: Completed/Met Date Met:  10/02/15  RD consulted for nutrition education regarding diabetes.     Lab Results  Component Value Date    HGBA1C 12.5* 11/07/2012   RD provided "Type 2 Diabetes Nutrition Therapy" and "Diabetes Label Reading Tips" handouts from the Academy of [employer]etics as well as a copy of the "My PLate" template for diabetics.   RD went through dietary recall.   Breakfast: Two eggs Mid morning snack: Banana Lunch: Salad w/ SF dressing Dinner: just a small snack such as nabs Other: Pt denies drinking anything but water or Dt. [place]. She denies eating any pasta, rice, corn, deserts, cookies, fruit (with exception of banana) or any other starches anything more than "rarely".   Consistent with the H&P, pt denies consuming any foods that are not in line with a diabetic diet  Re reeducated on the different food groups  and their effects on blood sugar. Told which groups were considered "carbs" and how the protein/vegetable groups offset the carb groups bg effect.  Accordingly, asked her to pair her banana with hers eggs in the morning. Explained she should never eat a carb by itself.   If there is one major area she could improve upon it would be maintaining controlled and consistent carbohydrate intake throughout the day. Explained skipping meals leads to highs and lows and worsen BG maintenance. She states she cannot eat later than 5:30 or she gets cramps or nausea. Hopefully, with better glucose control and compliance with medication, this will resolve and she will be able to a third meal.   RD briefly went over carb counting. Explained how some "carbs" are more dense  than others, thus the serving sizes of that item are smaller. Gave example of 1/3 cup cooked pasta vs 17 grapes. These both are 1 serving or 15 grams carb. Continued that if she was instructed to eat 3-5 servings per meal, that would be 45-75 g Carbs. She can get more "Bang for her buck" by choosing foods with a larger serving size. Provided list of carbohydrates and recommended serving sizes of common foods.  Provided examples of ways to balance meals/snacks according to the My Template. RD asked about whole grain intake. She says she eats grains very rarely in general but they are never white. She states she was told "anything white is bad for me".    Honestly, she sounds to be eating very little in general and encouraged more intake of high-fiber, whole grain complex carbohydrates and non starchy vegetables.   Pt states she reads label and looks at the sugar on the label. Asked her to also look at the portion size and the grams of fiber. Reccommended choosing foods with a high fiber number and a low sugar number. RD gave examples of how to take into account portion size when she is calculating how many carbs she is eating.   RD emphasized that it is in her best interest to comply with the diet and medications as her uncontrolled BG is likely what is making her feel so awful. She agrees.   Pt's reported diet does not really align with what her A1C and BG shows. Though she does have documented medical noncompliance and may not be taking  insulin or antihyperglycemics would expect much lower readings if she was following the diet she states. Either pt is not being transparent or there are other factors such as meds, chronic disease states, severe stress etc playing large role.   Diet wise, Expect good compliance (if she was being fully transparent about what she is currently eating)  Body mass index is 27.97 kg/(m^2). Pt meets criteria for Overweight based on current BMI.  Pt states she has an  upcoming appointment with endocrinologist and has plans to meet with his RD to further discuss diet  Current diet order is CL. She was tolerating it well.  No further nutrition interventions warranted at this time.  If additional nutrition issues arise, please re-consult RD.  Burtis Junes RD, LDN, CNSC Clinical Nutrition Pager: [phone] 10/02/2015 12:39 PM

## 2015-10-03 ENCOUNTER — Observation Stay (HOSPITAL_COMMUNITY): Payer: BLUE CROSS/BLUE SHIELD

## 2015-10-03 DIAGNOSIS — I10 Essential (primary) hypertension: Secondary | ICD-10-CM | POA: Diagnosis not present

## 2015-10-03 DIAGNOSIS — K3184 Gastroparesis: Secondary | ICD-10-CM | POA: Diagnosis not present

## 2015-10-03 DIAGNOSIS — E1143 Type 2 diabetes mellitus with diabetic autonomic (poly)neuropathy: Secondary | ICD-10-CM | POA: Diagnosis not present

## 2015-10-03 LAB — GLUCOSE, CAPILLARY
GLUCOSE-CAPILLARY: 156 mg/dL — AB (ref 65–99)
GLUCOSE-CAPILLARY: 156 mg/dL — AB (ref 65–99)
GLUCOSE-CAPILLARY: 231 mg/dL — AB (ref 65–99)
GLUCOSE-CAPILLARY: 209 mg/dL — AB (ref 65–99)

## 2015-10-03 LAB — HEMOGLOBIN A1C
HEMOGLOBIN A1C: 11.3 % — AB (ref 4.8–5.6)
MEAN PLASMA GLUCOSE: 278 mg/dL

## 2015-10-03 MED ORDER — BISACODYL 10 MG RE SUPP
10.0000 mg | Freq: Once | RECTAL | Status: AC
  Administered 2015-10-03: 10 mg via RECTAL
  Filled 2015-10-03: qty 1

## 2015-10-03 MED ORDER — ALBUTEROL SULFATE (2.5 MG/3ML) 0.083% IN NEBU: 2.5000 mg | INHALATION_SOLUTION | RESPIRATORY_TRACT | Status: DC | PRN

## 2015-10-03 MED ORDER — MAGNESIUM CITRATE PO SOLN
1.0000 | Freq: Once | ORAL | Status: AC
  Administered 2015-10-03: 1 via ORAL
  Filled 2015-10-03: qty 296

## 2015-10-03 MED ORDER — PEG 3350-KCL-NA BICARB-NACL 420 G PO SOLR
4000.0000 mL | Freq: Once | ORAL | Status: AC
  Administered 2015-10-03: 4000 mL via ORAL
  Filled 2015-10-03: qty 4000

## 2015-10-03 NOTE — Progress Notes (Signed)
PROGRESS NOTE    Alexandria Webster  [id number] DOB: [birth year] DOA: 10/01/2015 PCP: Purvis Kilts, MD    Brief Narrative:  50 y.o. female with medical history significant of uncontrolled DM and medical non-compliance. She has had 15 ER visits in 6 months, virtually all related to her DM. For the past 3 days,she states she has been vomiting all her stomach contents 5 minutes after she eats ANYTHING. She states she is able to swallow the food but it will only stay down 5 minutes. She denies diarrhea, denies sick contacts. She states she has taken her insulin as prescribed by Dr. Dorris Fetch. She does not eat often as she is not hungry and she denies eating sugary drinks or eating food that is not on her diabetic diet.   Assessment & Plan:   Principal Problem:   Gastroparesis due to DM [facility]) Active Problems:   Essential hypertension, benign   Leukocytosis   Type 2 diabetes mellitus, uncontrolled ([place])   Personal history of noncompliance with medical treatment, presenting hazards to health   Presumed gastroparesis -started reglan -Recommend outpatient diabetic emptying study -Patient reports history of constipation. Recent bowel movement noted be quite hard small -Given cathartics with some results -This morning, patient complains of continued abdominal pain. Follow-up abdominal x-ray with findings of increased stool burden. -Thus far, little results with magnesium citrate with Dulcolax suppository.  DM type 2- uncontrolled -Continue on SSI -tujeo and novolog with meals  HTN -continue lisinopril   DVT prophylaxis: Lovenox subQ Code Status: Full Family Communication: Pt in room, family not at bedside Disposition Plan: Anticipate home when able to tolerate soft diet   Consultants:     Procedures:     Antimicrobials:      Subjective: This a.m., patient complains of abdominal pain with food  Objective: Filed Vitals:   10/02/15 2123 10/03/15 0646 10/03/15  0755 10/03/15 1430  BP: 141/50 147/55  161/65  Pulse: 80 86  83  Temp: 98.2 F (36.8 C) 98.2 F (36.8 C)  98.1 F (36.7 C)  TempSrc: Oral Oral  Oral  Resp: 20 15  18   Height:      Weight:      SpO2: 100% 99% 98% 100%    Intake/Output Summary (Last 24 hours) at 10/03/15 1648 Last data filed at 10/03/15 1212  Gross per 24 hour  Intake    600 ml  Output   1000 ml  Net   -400 ml   Filed Weights   10/01/15 1157  Weight: 73.936 kg (163 lb)    Examination:  General exam: Appears calm and comfortable  Respiratory system: Clear to auscultation. Respiratory effort normal. Cardiovascular system: S1 & S2 heard, RRR. No JVD Gastrointestinal system: No organomegaly or masses, decreased bowel sounds Central nervous system: Alert and oriented. No focal neurological deficits. Extremities: Symmetric 5 x 5 power. Skin: No rashes, lesions  Psychiatry: Judgement and insight appear normal. Mood & affect appropriate.     Data Reviewed: I have personally reviewed following labs and imaging studies  CBC:  Recent Labs Lab 09/27/15 1310 10/01/15 1254 10/02/15 0428  WBC 10.8* 6.1 6.2  NEUTROABS 8.7*  --   --   HGB 15.4* 14.5 12.5  HCT 44.5 42.7 37.3  MCV 97.2 97.5 97.9  PLT 245 239 [id number]   Basic Metabolic Panel:  Recent Labs Lab 09/27/15 1310 10/01/15 1254 10/02/15 0428  NA 134* 136 145  K 3.3* 3.6 3.6  CL 98* 103 112*  CO2 26  24 27  GLUCOSE 453* 469* 84  BUN 16 10 9   CREATININE 0.65 0.70 0.49  CALCIUM 9.4 9.3 8.6*   GFR: Estimated Creatinine Clearance: 82 mL/min (by C-G formula based on Cr of 0.49). Liver Function Tests:  Recent Labs Lab 09/27/15 1310 10/02/15 0428  AST 17 13*  ALT 14 10*  ALKPHOS 70 46  BILITOT 0.4 0.5  PROT 8.0 6.1*  ALBUMIN 4.1 3.0*   No results for input(s): LIPASE, AMYLASE in the last 168 hours. No results for input(s): AMMONIA in the last 168 hours. Coagulation Profile: No results for input(s): INR, PROTIME in the last 168  hours. Cardiac Enzymes: No results for input(s): CKTOTAL, CKMB, CKMBINDEX, TROPONINI in the last 168 hours. BNP (last 3 results) No results for input(s): PROBNP in the last 8760 hours. HbA1C:  Recent Labs  10/01/15 1254  HGBA1C 11.3*   CBG:  Recent Labs Lab 10/02/15 0748 10/02/15 1122 10/02/15 2122 10/03/15 0744 10/03/15 1132  GLUCAP 107* 180* 303* 156* 156*   Lipid Profile: No results for input(s): CHOL, HDL, LDLCALC, TRIG, CHOLHDL, LDLDIRECT in the last 72 hours. Thyroid Function Tests: No results for input(s): TSH, T4TOTAL, FREET4, T3FREE, THYROIDAB in the last 72 hours. Anemia Panel: No results for input(s): VITAMINB12, FOLATE, FERRITIN, TIBC, IRON, RETICCTPCT in the last 72 hours. Sepsis Labs: No results for input(s): PROCALCITON, LATICACIDVEN in the last 168 hours.  No results found for this or any previous visit (from the past 240 hour(s)).       Radiology Studies: Dg Abd Portable 1v  10/03/2015  CLINICAL DATA:  Nausea and vomiting, constipation. EXAM: PORTABLE ABDOMEN - 1 VIEW COMPARISON:  None. FINDINGS: Fair amount of stool is seen in the colon. No small bowel dilatation. No unexpected radiopaque calculi. Vascular stent is seen in the abdominal midline. IMPRESSION: Bowel-gas pattern suggests mild constipation. Electronically Signed   By: Lorin Picket M.D.   On: 10/03/2015 12:16        Scheduled Meds: . aspirin EC  81 mg Oral Daily  . enoxaparin (LOVENOX) injection  40 mg Subcutaneous Q24H  . famotidine  10 mg Oral Daily  . insulin aspart  0-5 Units Subcutaneous QHS  . insulin aspart  0-9 Units Subcutaneous TID WC  . insulin aspart  6 Units Subcutaneous TID WC  . lisinopril  20 mg Oral Daily  . metoCLOPramide  5 mg Oral TID AC & HS  . mometasone-formoterol  2 puff Inhalation BID  . multivitamin with minerals  1 tablet Oral Daily  . oxyCODONE  5 mg Oral Once  . pantoprazole  40 mg Oral Daily   Continuous Infusions:      CHIU, Orpah Melter,  MD [facility] Pager [phone]  If 7PM-7AM, please contact night-coverage [email or web address] Password [facility] 10/03/2015, 4:48 PM

## 2015-10-04 DIAGNOSIS — D72829 Elevated white blood cell count, unspecified: Secondary | ICD-10-CM | POA: Diagnosis not present

## 2015-10-04 DIAGNOSIS — Z9119 Patient's noncompliance with other medical treatment and regimen: Secondary | ICD-10-CM | POA: Diagnosis not present

## 2015-10-04 DIAGNOSIS — I10 Essential (primary) hypertension: Secondary | ICD-10-CM | POA: Diagnosis not present

## 2015-10-04 DIAGNOSIS — E1143 Type 2 diabetes mellitus with diabetic autonomic (poly)neuropathy: Secondary | ICD-10-CM | POA: Diagnosis not present

## 2015-10-04 LAB — GLUCOSE, CAPILLARY
GLUCOSE-CAPILLARY: 185 mg/dL — AB (ref 65–99)
GLUCOSE-CAPILLARY: 266 mg/dL — AB (ref 65–99)
Glucose-Capillary: 249 mg/dL — ABNORMAL HIGH (ref 65–99)
Glucose-Capillary: 250 mg/dL — ABNORMAL HIGH (ref 65–99)

## 2015-10-04 NOTE — Progress Notes (Signed)
Doctor was able to communicate with patient regarding her admission and observation status.  Patient was able to understand regarding her admission status.

## 2015-10-04 NOTE — Progress Notes (Signed)
PROGRESS NOTE    Alexandria Webster  [id number] DOB: 1966-02-14 DOA: 10/01/2015 PCP: Purvis Kilts, MD    Brief Narrative:  50 y.o. female with medical history significant of uncontrolled DM and medical non-compliance. She has had 15 ER visits in 6 months, virtually all related to her DM. For the past 3 days,she states she has been vomiting all her stomach contents 5 minutes after she eats ANYTHING. She states she is able to swallow the food but it will only stay down 5 minutes. She denies diarrhea, denies sick contacts. She states she has taken her insulin as prescribed by Dr. Dorris Fetch. She does not eat often as she is not hungry and she denies eating sugary drinks or eating food that is not on her diabetic diet.   Assessment & Plan:   Principal Problem:   Gastroparesis due to DM [facility]) Active Problems:   Essential hypertension, benign   Leukocytosis   Type 2 diabetes mellitus, uncontrolled ([place])   Personal history of noncompliance with medical treatment, presenting hazards to health   Presumed gastroparesis versus constipation -started reglan -Recommend outpatient diabetic emptying study -Patient reports history of constipation. Recent bowel movement noted be quite hard small -Given cathartics with some results -Patient continued with abdominal pain and difficulty tolerating by mouth intake. Follow-up abdominal x-ray findings on 06/03/2015 with increased stool burden. -No significant results with magnesium citrate and Dulcolax. -Patient was given GoLYTELY with some results. -This morning, patient reported continued nausea, although she did clear her meal tray at lunchtime. Patient requested staying 1 more day in the hospital, stating that she would be willing to pay out of pocket for additional hospital stay.  DM type 2- uncontrolled -Continue on SSI -tujeo and novolog with meals  HTN -continue lisinopril   DVT prophylaxis: Lovenox subQ Code Status: Full Family  Communication: Pt in room, family not at bedside Disposition Plan: Anticipate home in 24 hours   Consultants:     Procedures:     Antimicrobials:      Subjective: Patient tolerated lunch. Reported feeling "scared" that she would feel sick again.  Objective: Vitals:   10/03/15 2203 10/04/15 0441 10/04/15 0738 10/04/15 1000  BP: (!) 165/67 (!) 142/76  (!) 136/55  Pulse: 80 89    Resp: 20 20    Temp: 98.4 F (36.9 C) 98 F (36.7 C)    TempSrc: Oral Oral    SpO2: 99% 99% 98%   Weight:      Height:        Intake/Output Summary (Last 24 hours) at 10/04/15 1522 Last data filed at 10/03/15 1730  Gross per 24 hour  Intake              240 ml  Output                0 ml  Net              240 ml   Filed Weights   10/01/15 1157  Weight: 73.9 kg (163 lb)    Examination:  General exam: Appears calm and comfortable, Lying in bed Respiratory system: Clear to auscultation. Respiratory effort normal. Cardiovascular system: S1 & S2 heard, RRR. No JVD Gastrointestinal system: No organomegaly or masses, decreased bowel sounds Central nervous system: Alert and oriented. No focal neurological deficits. Extremities: Symmetric 5 x 5 power. Skin: No rashes, lesions  Psychiatry: Judgement and insight appear normal. Mood & affect appropriate.     Data Reviewed: I have personally  reviewed following labs and imaging studies  CBC:  Recent Labs Lab 10/01/15 1254 10/02/15 0428  WBC 6.1 6.2  HGB 14.5 12.5  HCT 42.7 37.3  MCV 97.5 97.9  PLT 239 [id number]   Basic Metabolic Panel:  Recent Labs Lab 10/01/15 1254 10/02/15 0428  NA 136 145  K 3.6 3.6  CL 103 112*  CO2 24 27  GLUCOSE 469* 84  BUN 10 9  CREATININE 0.70 0.49  CALCIUM 9.3 8.6*   GFR: Estimated Creatinine Clearance: 82 mL/min (by C-G formula based on SCr of 0.8 mg/dL). Liver Function Tests:  Recent Labs Lab 10/02/15 0428  AST 13*  ALT 10*  ALKPHOS 46  BILITOT 0.5  PROT 6.1*  ALBUMIN 3.0*   No  results for input(s): LIPASE, AMYLASE in the last 168 hours. No results for input(s): AMMONIA in the last 168 hours. Coagulation Profile: No results for input(s): INR, PROTIME in the last 168 hours. Cardiac Enzymes: No results for input(s): CKTOTAL, CKMB, CKMBINDEX, TROPONINI in the last 168 hours. BNP (last 3 results) No results for input(s): PROBNP in the last 8760 hours. HbA1C: No results for input(s): HGBA1C in the last 72 hours. CBG:  Recent Labs Lab 10/03/15 1132 10/03/15 1654 10/03/15 2002 10/04/15 0743 10/04/15 1125  GLUCAP 156* 231* 209* 185* 266*   Lipid Profile: No results for input(s): CHOL, HDL, LDLCALC, TRIG, CHOLHDL, LDLDIRECT in the last 72 hours. Thyroid Function Tests: No results for input(s): TSH, T4TOTAL, FREET4, T3FREE, THYROIDAB in the last 72 hours. Anemia Panel: No results for input(s): VITAMINB12, FOLATE, FERRITIN, TIBC, IRON, RETICCTPCT in the last 72 hours. Sepsis Labs: No results for input(s): PROCALCITON, LATICACIDVEN in the last 168 hours.  No results found for this or any previous visit (from the past 240 hour(s)).       Radiology Studies: Dg Abd Portable 1v  Result Date: 10/03/2015 CLINICAL DATA:  Nausea and vomiting, constipation. EXAM: PORTABLE ABDOMEN - 1 VIEW COMPARISON:  None. FINDINGS: Fair amount of stool is seen in the colon. No small bowel dilatation. No unexpected radiopaque calculi. Vascular stent is seen in the abdominal midline. IMPRESSION: Bowel-gas pattern suggests mild constipation. Electronically Signed   By: Lorin Picket M.D.   On: 10/03/2015 12:16        Scheduled Meds: . aspirin EC  81 mg Oral Daily  . enoxaparin (LOVENOX) injection  40 mg Subcutaneous Q24H  . famotidine  10 mg Oral Daily  . insulin aspart  0-5 Units Subcutaneous QHS  . insulin aspart  0-9 Units Subcutaneous TID WC  . insulin aspart  6 Units Subcutaneous TID WC  . lisinopril  20 mg Oral Daily  . metoCLOPramide  5 mg Oral TID AC & HS  .  mometasone-formoterol  2 puff Inhalation BID  . multivitamin with minerals  1 tablet Oral Daily  . oxyCODONE  5 mg Oral Once  . pantoprazole  40 mg Oral Daily   Continuous Infusions:    LOS: 0 days   Isaack Preble, Orpah Melter, MD [facility] Pager [phone]  If 7PM-7AM, please contact night-coverage [email or web address] Password [facility] 10/04/2015, 3:22 PM

## 2015-10-05 DIAGNOSIS — E1143 Type 2 diabetes mellitus with diabetic autonomic (poly)neuropathy: Secondary | ICD-10-CM | POA: Diagnosis not present

## 2015-10-05 DIAGNOSIS — I10 Essential (primary) hypertension: Secondary | ICD-10-CM | POA: Diagnosis not present

## 2015-10-05 DIAGNOSIS — D72829 Elevated white blood cell count, unspecified: Secondary | ICD-10-CM | POA: Diagnosis not present

## 2015-10-05 DIAGNOSIS — K3184 Gastroparesis: Secondary | ICD-10-CM | POA: Diagnosis not present

## 2015-10-05 LAB — GLUCOSE, CAPILLARY
Glucose-Capillary: 114 mg/dL — ABNORMAL HIGH (ref 65–99)
Glucose-Capillary: 218 mg/dL — ABNORMAL HIGH (ref 65–99)
Glucose-Capillary: 283 mg/dL — ABNORMAL HIGH (ref 65–99)

## 2015-10-05 MED ORDER — POLYETHYLENE GLYCOL 3350 17 G PO PACK: 17.0000 g | PACK | Freq: Every day | ORAL | 0 refills | Status: DC

## 2015-10-05 MED ORDER — POLYETHYLENE GLYCOL 3350 17 G PO PACK
17.0000 g | PACK | Freq: Every day | ORAL | Status: DC
  Administered 2015-10-05: 17 g via ORAL
  Filled 2015-10-05: qty 1

## 2015-10-05 MED ORDER — METOCLOPRAMIDE HCL 5 MG PO TABS: 5.0000 mg | ORAL_TABLET | Freq: Three times a day (TID) | ORAL | 0 refills | Status: DC

## 2015-10-05 NOTE — Progress Notes (Signed)
Inpatient Diabetes Program Recommendations  AACE/ADA: New Consensus Statement on Inpatient Glycemic Control (2015)  Target Ranges:  Prepandial:   less than 140 mg/dL      Peak postprandial:   less than 180 mg/dL (1-2 hours)      Critically ill patients:  140 - 180 mg/dL  Results for Alexandria Webster, Alexandria Webster (MRN [id number]) as of 10/05/2015 11:06  Ref. Range 10/04/2015 07:43 10/04/2015 11:25 10/04/2015 16:49 10/04/2015 21:45 10/05/2015 07:27  Glucose-Capillary Latest Ref Range: 65 - 99 mg/dL 185 (H) 266 (H) 249 (H) 250 (H) 218 (H)    Review of Glycemic Control  Diabetes history: DM2 Outpatient Diabetes medications: Toujeo 30 units QHS, Novolog 5-10 units TID with meals Current orders for Inpatient glycemic control: Novolog 0-9 units TID with meals, Novolog 0-5 units QHS, Novolog 6 units TID with meals for meal coverage  Inpatient Diabetes Program Recommendations: Insulin - Basal: Please consider ordering Lantus 7 units daily (starting now) and continue to adjust as needed.  Thanks, Barnie Alderman, RN, MSN, CDE Diabetes Coordinator Inpatient Diabetes Program [phone] (Team Pager from [place] to [place]) [phone] ([place] office) [phone] [facility] office) [phone] [facility] office)

## 2015-10-05 NOTE — Discharge Summary (Signed)
Physician Discharge Summary  Alexandria Webster [id number] DOB: 1965-03-15 DOA: 10/01/2015  PCP: Purvis Kilts, MD  Admit date: 10/01/2015 Discharge date: 10/05/2015  Admitted From: Home Disposition:  home  Recommendations for Outpatient Follow-up:  1. Follow up with PCP in 1-2 weeks  Discharge Condition:Improved CODE STATUS:Full Diet recommendation: Regular   Brief/Interim Summary: 50 y.o. female with medical history significant of uncontrolled DM and medical non-compliance. She has had 15 ER visits in 6 months, virtually all related to her DM. For the past 3 days,she states she has been vomiting all her stomach contents 5 minutes after she eats ANYTHING. She states she is able to swallow the food but it will only stay down 5 minutes. She denies diarrhea, denies sick contacts. She states she has taken her insulin as prescribed by Dr. Dorris Fetch. She does not eat often as she is not hungry and she denies eating sugary drinks or eating food that is not on her diabetic diet.   Presumed gastroparesis versus constipation -started reglan -Recommend outpatient diabetic emptying study -Patient reports history of constipation. Recent bowel movement noted be quite hard small -Given cathartics with some results -Patient continued with abdominal pain and difficulty tolerating by mouth intake. Follow-up abdominal x-ray findings on 06/03/2015 with increased stool burden. -No significant results with magnesium citrate and Dulcolax. -Patient was given GoLYTELY with some results. -By day of discharge, patient was able to tolerate PO without difficulty. Patient will be discharged with miralax and reglan. Pt instructed to follow up closely with PCP  DM type 2- uncontrolled -Continue on SSI -tujeo and novolog with meals  HTN -continue lisinopril  Discharge Diagnoses:  Principal Problem:   Gastroparesis due to DM [facility]) Active Problems:   Essential hypertension, benign   Leukocytosis    Type 2 diabetes mellitus, uncontrolled ([place])   Personal history of noncompliance with medical treatment, presenting hazards to health      Medication List    STOP taking these medications   potassium chloride SA 20 MEQ tablet Commonly known as:  K-DUR,KLOR-CON     TAKE these medications   ACCU-CHEK AVIVA device Use as instructed   aspirin EC 81 MG tablet Take 81 mg by mouth every morning.   budesonide-formoterol 160-4.5 MCG/ACT inhaler Commonly known as:  SYMBICORT Inhale 2 puffs into the lungs 2 (two) times daily.   CLEAR EYES FOR DRY EYES OP Place 2 drops into both eyes 2 (two) times daily.   esomeprazole 20 MG capsule Commonly known as:  NEXIUM Take 20 mg by mouth daily at 12 noon.   GARLIC PO Take 1 tablet by mouth daily.   glucose blood test strip Commonly known as:  ACCU-CHEK AVIVA Test glucose 4 times a day   insulin aspart 100 UNIT/ML FlexPen Commonly known as:  NOVOLOG FLEXPEN Inject 10-16 Units into the skin 3 (three) times daily with meals. What changed:  how much to take   Insulin Glargine 300 UNIT/ML Sopn Commonly known as:  TOUJEO SOLOSTAR Inject 30 Units into the skin at bedtime.   Insulin Pen Needle 31G X 5 MM Misc Commonly known as:  B-D UF III MINI PEN NEEDLES Use qhs   lisinopril 20 MG tablet Commonly known as:  PRINIVIL,ZESTRIL Take 20 mg by mouth daily.   metoCLOPramide 5 MG tablet Commonly known as:  REGLAN Take 1 tablet (5 mg total) by mouth 4 (four) times daily -  before meals and at bedtime.   multivitamin with minerals Tabs tablet Take 1 tablet by  mouth daily.   ondansetron 4 MG disintegrating tablet Commonly known as:  ZOFRAN ODT Take 1 tablet (4 mg total) by mouth every 8 (eight) hours as needed for nausea or vomiting.   polyethylene glycol packet Commonly known as:  MIRALAX / GLYCOLAX Take 17 g by mouth daily.      Follow-up Information    Purvis Kilts, MD. Schedule an appointment as soon as possible for a  visit in 2 week(s).   Specialty:  Family Medicine Contact information: [address] [place] [id number] [id number]          No Known Allergies   Procedures/Studies: Dg Chest 2 View  Result Date: 09/14/2015 CLINICAL DATA:  Acute onset of mild nausea and hyperglycemia. Vomiting, shortness of breath and blurred vision. Initial encounter. EXAM: CHEST  2 VIEW COMPARISON:  Chest radiograph performed 09/05/2015 FINDINGS: The lungs are well-aerated and clear. There is no evidence of focal opacification, pleural effusion or pneumothorax. The heart is normal in size; the mediastinal contour is within normal limits. No acute osseous abnormalities are seen. IMPRESSION: No acute cardiopulmonary process seen. Electronically Signed   By: Garald Balding M.D.   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 Atherosclerotic calcification aorta. Lungs clear. No pleural effusion or pneumothorax. Bones unremarkable. IMPRESSION: No acute abnormalities. Aortic atherosclerosis. Electronically Signed   By: Lavonia Dana M.D.   On: 09/05/2015 16:53   Ct Head Wo Contrast  Result Date: 09/16/2015 CLINICAL DATA:  Head injury, struck head on a door today. Headache occult it. EXAM: CT HEAD WITHOUT CONTRAST TECHNIQUE: Contiguous axial images were obtained from the base of the skull through the vertex without intravenous contrast. COMPARISON:  None. FINDINGS: Brain: No intracranial hemorrhage, mass effect, or midline shift. No hydrocephalus. The basilar cisterns are patent. No evidence of territorial infarct. Minimal chronic small vessel ischemia. No intracranial fluid  collection. Vascular: No hyperdense vessel or abnormal calcification. Atherosclerosis of skullbase vasculature. Skull:  Calvarium is intact. Sinuses/Orbits: Included paranasal sinuses and mastoid air cells are well aerated. Other: Small left occipital scalp density, may be a sebaceous cyst or sequela of injury. IMPRESSION: No acute intracranial abnormality. Electronically Signed   By: Jeb Levering M.D.   On: 09/16/2015 20:29   Dg Abd Portable 1v  Result Date: 10/03/2015 CLINICAL DATA:  Nausea and vomiting, constipation. EXAM: PORTABLE ABDOMEN - 1 VIEW COMPARISON:  None. FINDINGS: Fair amount of stool is seen in the colon. No small bowel dilatation. No unexpected radiopaque calculi. Vascular stent is seen in the abdominal midline. IMPRESSION: Bowel-gas pattern suggests mild constipation. Electronically Signed   By: Lorin Picket M.D.   On: 10/03/2015 12:16     Subjective: Tolerated hamburger and chips this afternoon  Discharge Exam: Vitals:   10/04/15 2148 10/05/15 0510  BP: (!) 155/88 (!) 144/79  Pulse: 95 79  Resp: 20 15  Temp: 98.3 F (36.8 C) 98.3 F (36.8 C)   Vitals:   10/04/15 1949 10/04/15 2148 10/05/15 0510 10/05/15 0801  BP:  (!) 155/88 (!) 144/79   Pulse:  95 79   Resp:  20 15   Temp:  98.3 F (36.8 C) 98.3 F (36.8 C)   TempSrc:  Oral Oral   SpO2: 99% 98% 99% 97%  Weight:      Height:        General: Pt is alert, awake, not in acute distress Cardiovascular: RRR, S1/S2 +, no rubs, no gallops Respiratory: CTA bilaterally, no wheezing, no rhonchi Abdominal: Soft, NT, ND, bowel sounds + Extremities: no edema, no cyanosis   The results of significant diagnostics from this hospitalization (including imaging, microbiology, ancillary and laboratory) are listed below for reference.     Microbiology: No results found for this or any previous visit (from the past 240 hour(s)).   Labs: BNP (last 3 results) No results for input(s): BNP in the last 8760  hours. Basic Metabolic Panel:  Recent Labs Lab 10/01/15 1254 10/02/15 0428  NA 136 145  K 3.6 3.6  CL 103 112*  CO2 24 27  GLUCOSE 469* 84  BUN 10 9  CREATININE 0.70 0.49  CALCIUM 9.3 8.6*   Liver Function Tests:  Recent Labs Lab 10/02/15 0428  AST 13*  ALT 10*  ALKPHOS 46  BILITOT 0.5  PROT 6.1*  ALBUMIN 3.0*   No results for input(s): LIPASE, AMYLASE in the last 168 hours. No results for input(s): AMMONIA in the last 168 hours. CBC:  Recent Labs Lab 10/01/15 1254 10/02/15 0428  WBC 6.1 6.2  HGB 14.5 12.5  HCT 42.7 37.3  MCV 97.5 97.9  PLT 239 221   Cardiac Enzymes: No results for input(s): CKTOTAL, CKMB, CKMBINDEX, TROPONINI in the last 168 hours. BNP: Invalid input(s): POCBNP CBG:  Recent Labs Lab 10/04/15 1125 10/04/15 1649 10/04/15 2145 10/05/15 0727 10/05/15 1129  GLUCAP 266* 249* 250* 218* 283*   D-Dimer No results for input(s): DDIMER in the last 72 hours. Hgb A1c No results for input(s): HGBA1C in the last 72 hours. Lipid Profile No results for input(s): CHOL, HDL, LDLCALC, TRIG, CHOLHDL, LDLDIRECT in the last 72 hours. Thyroid function studies No results for input(s): TSH, T4TOTAL, T3FREE, THYROIDAB in the last 72 hours.  Invalid input(s): FREET3 Anemia work up No results for input(s): VITAMINB12, FOLATE, FERRITIN, TIBC, IRON, RETICCTPCT in the last 72 hours. Urinalysis    Component Value Date/Time   COLORURINE STRAW (A) 10/01/2015 1200   APPEARANCEUR CLEAR 10/01/2015 1200   LABSPEC <1.005 (L) 10/01/2015 1200   PHURINE 5.0 10/01/2015 1200   GLUCOSEU >1000 (A) 10/01/2015 1200   HGBUR NEGATIVE 10/01/2015 1200   BILIRUBINUR NEGATIVE 10/01/2015 1200   KETONESUR NEGATIVE 10/01/2015 1200   PROTEINUR NEGATIVE 10/01/2015 1200   UROBILINOGEN 0.2 12/21/2014 0902   NITRITE NEGATIVE 10/01/2015 1200   LEUKOCYTESUR NEGATIVE 10/01/2015 1200   Sepsis Labs Invalid input(s): PROCALCITONIN,  WBC,  LACTICIDVEN Microbiology No results found  for this or any previous visit (from the past 240 hour(s)).   SIGNED:   Donne Hazel, MD  [facility] 10/05/2015, 3:08 PM  If 7PM-7AM, please contact night-coverage [email or web address] Password [email or web address]

## 2015-10-05 NOTE — Progress Notes (Signed)
Patient discharged with instructions, prescription, and care notes.  Verbalized understanding via teach back.  IV was removed and the site was WNL. Patient voiced no further complaints or concerns at the time of discharge.  Appointments scheduled per instructions.  Patient left the floor via w/c with staff and family in stable condition. 

## 2015-10-08 ENCOUNTER — Ambulatory Visit: Payer: Self-pay | Admitting: "Endocrinology

## 2015-10-08 ENCOUNTER — Ambulatory Visit: Payer: Self-pay | Admitting: Nutrition

## 2015-10-14 ENCOUNTER — Encounter: Payer: Self-pay | Admitting: "Endocrinology

## 2015-10-14 ENCOUNTER — Ambulatory Visit (INDEPENDENT_AMBULATORY_CARE_PROVIDER_SITE_OTHER): Payer: BLUE CROSS/BLUE SHIELD | Admitting: "Endocrinology

## 2015-10-14 ENCOUNTER — Encounter: Payer: BLUE CROSS/BLUE SHIELD | Attending: "Endocrinology | Admitting: Nutrition

## 2015-10-14 VITALS — Ht 64.0 in | Wt 136.0 lb

## 2015-10-14 VITALS — HR 84 | Wt 136.1 lb

## 2015-10-14 DIAGNOSIS — Z794 Long term (current) use of insulin: Secondary | ICD-10-CM

## 2015-10-14 DIAGNOSIS — Z713 Dietary counseling and surveillance: Secondary | ICD-10-CM | POA: Diagnosis not present

## 2015-10-14 DIAGNOSIS — E1165 Type 2 diabetes mellitus with hyperglycemia: Secondary | ICD-10-CM

## 2015-10-14 DIAGNOSIS — I10 Essential (primary) hypertension: Secondary | ICD-10-CM | POA: Diagnosis not present

## 2015-10-14 DIAGNOSIS — Z9119 Patient's noncompliance with other medical treatment and regimen: Secondary | ICD-10-CM | POA: Diagnosis not present

## 2015-10-14 DIAGNOSIS — IMO0002 Reserved for concepts with insufficient information to code with codable children: Secondary | ICD-10-CM

## 2015-10-14 DIAGNOSIS — F17210 Nicotine dependence, cigarettes, uncomplicated: Secondary | ICD-10-CM | POA: Insufficient documentation

## 2015-10-14 DIAGNOSIS — E119 Type 2 diabetes mellitus without complications: Secondary | ICD-10-CM | POA: Diagnosis not present

## 2015-10-14 DIAGNOSIS — Z91199 Patient's noncompliance with other medical treatment and regimen due to unspecified reason: Secondary | ICD-10-CM

## 2015-10-14 DIAGNOSIS — E118 Type 2 diabetes mellitus with unspecified complications: Secondary | ICD-10-CM

## 2015-10-14 NOTE — Progress Notes (Signed)
  Medical Nutrition Therapy:  Appt start time: 1330 end time:  1430.  Lab Results  Component Value Date   HGBA1C 11.3 (H) 10/01/2015    Assessment:  Primary concerns today: Diabetes Type 2. LIves with her  her boyrfriend and her grandson lives with her. Had Dm for 20 yrs or longer. She does the cooking and shopping. Prepared : boiled. Eats 2-3 meals per day. Toujeo 30 units per day and Novolog 10 units plus sliding scale. Has been feeling tired, no energy.  Has carious teeth and this effects her ability to chew some meats.  Likes vegetables. Has lost 60-70 lbs in a year. She would eat and would get sick on her stomach and throw up daily. Denies Bulmia .  Smokes 1/2 cigarette per day.. Interested in smoking cessation and [place] brochure given. Physical actvity: 1/2 mile a day.  Hair is bittle  Preferred Learning Style:  Auditoryl  Hands on  Visual but not reading.  Learning Readiness:  Ready  Change in progress   MEDICATIONS:   DIETARY INTAKE:   24-hr recall:  B ( AM): 2 scrambled eggs, Diet MT Dew,  Snk ( AM): none  L ( PM): Toss salad, cheese, lettuce,onion, tomato, Dt MT Dew Snk ( PM): none D ( PM): Crunchy tortilla with quacamolie, Dt Mt Dew,  Snk ( PM): water Beverages: water, Dt Mt Dew  Usual physical activity: None  Estimated energy needs: 1500 calories 170 g carbohydrates 112 g protein 42 g fat  Progress Towards Goal(s):  In progress.   Nutritional Diagnosis:  NB-1.1 Food and nutrition-related knowledge deficit As related to Diabetes.  As evidenced by A1C 11.3%.    Intervention:  Nutrition and Diabetes education provided on My Plate, CHO counting, meal planning, portion sizes, timing of meals, avoiding snacks between meals unless having a low blood sugar, target ranges for A1C and blood sugars, signs/symptoms and treatment of hyper/hypoglycemia, monitoring blood sugars, taking medications as prescribed, benefits of exercise  Goals 1 Follow My  Plate 2. Cut out Dt [facility] and drink water 3. Do not skip meals 4. Take meds as prescribed 5. Eat 2-3 carb choices per meals 6. Exercise 15 minutes daily. Get BS down to 8% in three monthssing 30 minutes per day and prevention of complications of DM.   Teaching Method Utilized:  Visual Auditory Hands on  Handouts given during visit include:  The Plate Method  Meal Plan Card  Diabetes Instructions  Barriers to learning/adherence to lifestyle change: None  Demonstrated degree of understanding via:  Teach Back   Monitoring/Evaluation:  Dietary intake, exercise, meal planning, SBG, and body weight in 1 month(s).

## 2015-10-14 NOTE — Progress Notes (Signed)
Subjective:    Patient ID: Alexandria Webster, female    DOB: 10/05/1965. Patient is being seen in f/u  for management of diabetes requested by  Purvis Kilts, MD  Past Medical History:  Diagnosis Date  . Asthma   . DDD (degenerative disc disease), lumbar   . Diabetes mellitus   . Hypertension   . PVD (peripheral vascular disease) ([place])    Past Surgical History:  Procedure Laterality Date  . BACK SURGERY    . CORONARY ANGIOPLASTY WITH STENT PLACEMENT    . CORONARY ARTERY BYPASS GRAFT    . FEMORAL ARTERY STENT  right leg   Social History   Social History  . Marital status: Married    Spouse name: N/A  . Number of children: N/A  . Years of education: N/A   Social History Main Topics  . Smoking status: Current Every Day Smoker    Packs/day: 0.50    Years: 30.00    Types: Cigarettes  . Smokeless tobacco: Never Used  . Alcohol use No  . Drug use: No  . Sexual activity: Yes    Birth control/ protection: None   Other Topics Concern  . None   Social History Narrative  . None   Outpatient Encounter Prescriptions as of 10/14/2015  Medication Sig  . aspirin EC 81 MG tablet Take 81 mg by mouth every morning.   . Blood Glucose Monitoring Suppl (ACCU-CHEK AVIVA) device Use as instructed  . budesonide-formoterol (SYMBICORT) 160-4.5 MCG/ACT inhaler Inhale 2 puffs into the lungs 2 (two) times daily.  . Carboxymethylcellul-Glycerin (CLEAR EYES FOR DRY EYES OP) Place 2 drops into both eyes 2 (two) times daily.  Marland Kitchen esomeprazole (NEXIUM) 20 MG capsule Take 20 mg by mouth daily at 12 noon.  Marland Kitchen GARLIC PO Take 1 tablet by mouth daily.  Marland Kitchen glucose blood (ACCU-CHEK AVIVA) test strip Test glucose 4 times a day  . insulin aspart (NOVOLOG FLEXPEN) 100 UNIT/ML FlexPen Inject 10-16 Units into the skin 3 (three) times daily with meals. (Patient taking differently: Inject 5-10 Units into the skin 3 (three) times daily with meals. )  . Insulin Glargine (TOUJEO SOLOSTAR) 300 UNIT/ML SOPN  Inject 30 Units into the skin at bedtime.  . Insulin Pen Needle (B-D UF III MINI PEN NEEDLES) 31G X 5 MM MISC Use qhs  . lisinopril (PRINIVIL,ZESTRIL) 20 MG tablet Take 20 mg by mouth daily.  . metoCLOPramide (REGLAN) 5 MG tablet Take 1 tablet (5 mg total) by mouth 4 (four) times daily -  before meals and at bedtime.  . Multiple Vitamin (MULTIVITAMIN WITH MINERALS) TABS tablet Take 1 tablet by mouth daily.  . ondansetron (ZOFRAN ODT) 4 MG disintegrating tablet Take 1 tablet (4 mg total) by mouth every 8 (eight) hours as needed for nausea or vomiting.  . polyethylene glycol (MIRALAX / GLYCOLAX) packet Take 17 g by mouth daily.   No facility-administered encounter medications on file as of 10/14/2015.    ALLERGIES: No Known Allergies VACCINATION STATUS:  There is no immunization history on file for this patient.  Diabetes  She presents for her follow-up diabetic visit. She has type 2 diabetes mellitus. Onset time: She was diagnosed at approximate age of 25 years. Her disease course has been improving (She was seen in the emergency room with hyperglycemia and near DKA yesterday. ER doctor called on behalf of patient. And she was offered her walking visit today. Her last visit with me was more than 3 years ago.).  There are no hypoglycemic associated symptoms. Pertinent negatives for hypoglycemia include no confusion, pallor or seizures. Associated symptoms include polydipsia and polyuria. Pertinent negatives for diabetes include no polyphagia. There are no hypoglycemic complications. Symptoms are improving. Diabetic complications include heart disease and nephropathy. Risk factors for coronary artery disease include diabetes mellitus, dyslipidemia, sedentary lifestyle and tobacco exposure. Current diabetic treatment includes insulin injections. She is compliant with treatment none of the time (She did not keep her last appointment, came with no meter nor log. She visited emergency room since last visit,  was started on NovoLog 70/30 30 units twice a day.). Insulin injections are given by patient. Her weight is decreasing steadily. She is following a generally unhealthy diet. When asked about meal planning, she reported none. She has not had a previous visit with a dietitian. She never (She has fracture of right lower extremity currently in cost.) participates in exercise. She monitors blood glucose at home 1-2 x per day. There is no compliance with monitoring of blood glucose. Home blood sugar record trend: She does not monitor blood glucose for lack of supplies. Her breakfast blood glucose range is generally 180-200 mg/dl. Her lunch blood glucose range is generally 180-200 mg/dl. Her dinner blood glucose range is generally 180-200 mg/dl. Her overall blood glucose range is 180-200 mg/dl. (Despite strict diabetes management plan given to her, she did not monitor adequately . she monitored only 6 times in the last 7 days rather randomly when she was supposed to test 4 times a day before meals and at bedtime. She did not bring a log to show her utilization of insulin.  ) An ACE inhibitor/angiotensin II receptor blocker is being taken.  Hypertension  This is a chronic problem. The current episode started more than 1 year ago. The problem is controlled. Pertinent negatives include no palpitations or shortness of breath. Risk factors for coronary artery disease include dyslipidemia, diabetes mellitus, smoking/tobacco exposure and sedentary lifestyle. Past treatments include ACE inhibitors. Hypertensive end-organ damage includes kidney disease and CAD/MI.     Review of Systems  Constitutional: Negative for unexpected weight change.  HENT: Negative for trouble swallowing and voice change.   Eyes: Negative for visual disturbance.  Respiratory: Negative for shortness of breath and wheezing.   Cardiovascular: Negative for palpitations and leg swelling.  Gastrointestinal: Negative for diarrhea.  Endocrine:  Positive for polydipsia and polyuria. Negative for cold intolerance, heat intolerance and polyphagia.  Musculoskeletal: Positive for gait problem.       She has fracture of right lower extremity in the past.  Skin: Negative for color change, pallor and wound.  Neurological: Negative for seizures.  Psychiatric/Behavioral: Negative for confusion and suicidal ideas.    Objective:    Pulse 84   Wt 136 lb 2 oz (61.7 kg)   BMI 23.37 kg/m   Wt Readings from Last 3 Encounters:  10/14/15 136 lb 2 oz (61.7 kg)  10/01/15 163 lb (73.9 kg)  10/01/15 135 lb (61.2 kg)    Physical Exam  Constitutional: She is oriented to person, place, and time. She appears well-developed.  HENT:  Head: Normocephalic and atraumatic.  Eyes: EOM are normal.  Neck: Normal range of motion. Neck supple. No tracheal deviation present. No thyromegaly present.  Cardiovascular: Normal rate and regular rhythm.   Pulmonary/Chest: Effort normal and breath sounds normal.  Abdominal: Soft. Bowel sounds are normal. There is no tenderness. There is no guarding.  Musculoskeletal: She exhibits no edema.  Neurological: She is alert and oriented  to person, place, and time. She has normal reflexes. No cranial nerve deficit. Coordination normal.  Skin: Skin is warm and dry. No rash noted. No erythema. No pallor.  Psychiatric: She has a normal mood and affect. Judgment normal.     CMP ( most recent) CMP     Component Value Date/Time   NA 145 10/02/2015 0428   K 3.6 10/02/2015 0428   CL 112 (H) 10/02/2015 0428   CO2 27 10/02/2015 0428   GLUCOSE 84 10/02/2015 0428   BUN 9 10/02/2015 0428   CREATININE 0.49 10/02/2015 0428   CALCIUM 8.6 (L) 10/02/2015 0428   PROT 6.1 (L) 10/02/2015 0428   ALBUMIN 3.0 (L) 10/02/2015 0428   AST 13 (L) 10/02/2015 0428   ALT 10 (L) 10/02/2015 0428   ALKPHOS 46 10/02/2015 0428   BILITOT 0.5 10/02/2015 0428   GFRNONAA >60 10/02/2015 0428   GFRAA >60 10/02/2015 0428    Diabetic Labs (most  recent): Lab Results  Component Value Date   HGBA1C 11.3 (H) 10/01/2015   HGBA1C 12.5 (H) 11/07/2012     Assessment & Plan:   1. Uncontrolled type 2 diabetes mellitus with Cardiac complication, with long-term current use of insulin ([place])  - Patient has currently uncontrolled symptomatic type 2 DM since  50 years of age,  with  No recent A1c however her last documented A1c was 11.3%.    Her diabetes is complicated by  coronary artery disease, chronic kidney disease, noncompliance, and patient remains at a high risk for more acute and chronic complications of diabetes which include CAD, CVA, CKD, retinopathy, and neuropathy. These are all discussed in detail with the patient.  - I have counseled the patient on diet management by adopting a carbohydrate restricted/protein rich diet.  - Suggestion is made for patient to avoid simple carbohydrates   from their diet including Cakes , Desserts, Ice Cream,  Soda (  diet and regular) , Sweet Tea , Candies,  Chips, Cookies, Artificial Sweeteners,   and "Sugar-free" Products . This will help patient to have stable blood glucose profile and potentially avoid unintended weight gain.  - I encouraged the patient to switch to  unprocessed or minimally processed complex starch and increased protein intake (animal or plant source), fruits, and vegetables.  - Patient is advised to stick to a routine mealtimes to eat 3 meals  a day and avoid unnecessary snacks ( to snack only to correct hypoglycemia).  - The patient will be scheduled with Jearld Fenton, RDN, CDE for individualized DM education.  - Emphasizing the need for compliance, I have approached patient with the following individualized plan to manage diabetes and patient agrees:   -  She came with a meter And log showing improving her glucose profile in the last week. - She did not document her NovoLog injection activities.   - I urged her to continue the plan I gave her last visit and  reemphasized today: Toujeo 30 units daily at bedtime,  NovoLog 10 units 3 times a day before meals plus correction.  - Patient is warned not to take insulin without proper monitoring per orders. -Adjustment parameters are given for hypo and hyperglycemia in writing. -Patient is encouraged to call clinic for blood glucose levels less than 70 or above 200 mg /dl. - Her renal function is favorable, she will be considered for low-dose metformin next visit.  -She is not a candidate for SGLT 2 inhibitors,  incretin therapy. - Patient specific target  A1c;  LDL, HDL, Triglycerides, and  Waist Circumference were discussed in detail.  2) BP/HTN: uncontrolled. Continue current medications including ACEI/ARB. 3) Lipids/HPL: lipid panel unknown, she is not on statins. I will obtain lipid panel on subsequent visits.  4)  Weight/Diet: CDE Consult will be initiated , exercise, and detailed carbohydrates information provided.  5) Chronic Care/Health Maintenance:  -Patient is on ACEI/ARB and encouraged to continue to follow up with Ophthalmology, Podiatrist at least yearly or according to recommendations, and advised to   quit stay away from smoking. I have recommended yearly flu vaccine and pneumonia vaccination at least every 5 years; moderate intensity exercise for up to 150 minutes weekly; and  sleep for at least 7 hours a day.  - 25 minutes of time was spent on the care of this patient , 50% of which was applied for counseling on diabetes complications and their preventions.  - Patient to bring meter and  blood glucose logs during their next visit.   - I advised patient to maintain close follow up with Purvis Kilts, MD for primary care needs.  Follow up plan: - Return in about 4 weeks (around 11/11/2015) for follow up with meter and logs- no labs.  Glade Lloyd, MD Phone: [phone]  Fax: [phone]   10/14/2015, 11:37 AM

## 2015-10-15 ENCOUNTER — Encounter (HOSPITAL_COMMUNITY): Payer: Self-pay | Admitting: Emergency Medicine

## 2015-10-15 ENCOUNTER — Emergency Department (HOSPITAL_COMMUNITY): Payer: BLUE CROSS/BLUE SHIELD

## 2015-10-15 ENCOUNTER — Emergency Department (HOSPITAL_COMMUNITY)
Admission: EM | Admit: 2015-10-15 | Discharge: 2015-10-15 | Disposition: A | Discharge status: Home / Self Care | Payer: BLUE CROSS/BLUE SHIELD | Attending: Emergency Medicine | Admitting: Emergency Medicine

## 2015-10-15 DIAGNOSIS — E119 Type 2 diabetes mellitus without complications: Secondary | ICD-10-CM | POA: Diagnosis not present

## 2015-10-15 DIAGNOSIS — Z794 Long term (current) use of insulin: Secondary | ICD-10-CM | POA: Insufficient documentation

## 2015-10-15 DIAGNOSIS — Z7982 Long term (current) use of aspirin: Secondary | ICD-10-CM | POA: Insufficient documentation

## 2015-10-15 DIAGNOSIS — Z79899 Other long term (current) drug therapy: Secondary | ICD-10-CM | POA: Insufficient documentation

## 2015-10-15 DIAGNOSIS — I10 Essential (primary) hypertension: Secondary | ICD-10-CM | POA: Insufficient documentation

## 2015-10-15 DIAGNOSIS — F17210 Nicotine dependence, cigarettes, uncomplicated: Secondary | ICD-10-CM | POA: Diagnosis not present

## 2015-10-15 DIAGNOSIS — R112 Nausea with vomiting, unspecified: Secondary | ICD-10-CM | POA: Insufficient documentation

## 2015-10-15 DIAGNOSIS — J45909 Unspecified asthma, uncomplicated: Secondary | ICD-10-CM | POA: Insufficient documentation

## 2015-10-15 DIAGNOSIS — R1084 Generalized abdominal pain: Secondary | ICD-10-CM | POA: Diagnosis present

## 2015-10-15 LAB — COMPREHENSIVE METABOLIC PANEL
ALBUMIN: 3.7 g/dL (ref 3.5–5.0)
ALT: 16 U/L (ref 14–54)
ANION GAP: 4 — AB (ref 5–15)
AST: 13 U/L — AB (ref 15–41)
Alkaline Phosphatase: 59 U/L (ref 38–126)
BUN: 20 mg/dL (ref 6–20)
CHLORIDE: 106 mmol/L (ref 101–111)
CO2: 27 mmol/L (ref 22–32)
Calcium: 8.9 mg/dL (ref 8.9–10.3)
Creatinine, Ser: 0.57 mg/dL (ref 0.44–1.00)
GFR calc Af Amer: >60 mL/min (ref >60)
GFR calc non Af Amer: >60 mL/min (ref >60)
GLUCOSE: 237 mg/dL — AB (ref 65–99)
POTASSIUM: 3.8 mmol/L (ref 3.5–5.1)
Sodium: 137 mmol/L (ref 135–145)
TOTAL PROTEIN: 7.2 g/dL (ref 6.5–8.1)
Total Bilirubin: 0.3 mg/dL (ref 0.3–1.2)

## 2015-10-15 LAB — CBC
HEMATOCRIT: 37 % (ref 36.0–46.0)
HEMOGLOBIN: 12.5 g/dL (ref 12.0–15.0)
MCH: 33.2 pg (ref 26.0–34.0)
MCHC: 33.8 g/dL (ref 30.0–36.0)
MCV: 98.4 fL (ref 78.0–100.0)
Platelets: 243 10*3/uL (ref 150–400)
RBC: 3.76 MIL/uL — ABNORMAL LOW (ref 3.87–5.11)
RDW: 12.5 % (ref 11.5–15.5)
WBC: 7.6 10*3/uL (ref 4.0–10.5)

## 2015-10-15 LAB — URINALYSIS, ROUTINE W REFLEX MICROSCOPIC
Bilirubin Urine: NEGATIVE
Hgb urine dipstick: NEGATIVE
KETONES UR: NEGATIVE mg/dL
LEUKOCYTES UA: NEGATIVE
NITRITE: NEGATIVE
PH: 6 (ref 5.0–8.0)
Protein, ur: NEGATIVE mg/dL
SPECIFIC GRAVITY, URINE: 1.01 (ref 1.005–1.030)

## 2015-10-15 LAB — URINE MICROSCOPIC-ADD ON: RBC / HPF: NONE SEEN RBC/hpf (ref 0–5)

## 2015-10-15 LAB — LIPASE, BLOOD: LIPASE: 19 U/L (ref 11–51)

## 2015-10-15 MED ORDER — FAMOTIDINE IN NACL 20-0.9 MG/50ML-% IV SOLN
20.0000 mg | Freq: Once | INTRAVENOUS | Status: AC
  Administered 2015-10-15: 20 mg via INTRAVENOUS
  Filled 2015-10-15: qty 50

## 2015-10-15 MED ORDER — DIATRIZOATE MEGLUMINE & SODIUM 66-10 % PO SOLN
ORAL | Status: AC
  Filled 2015-10-15: qty 30

## 2015-10-15 MED ORDER — ONDANSETRON HCL 4 MG PO TABS: 4.0000 mg | ORAL_TABLET | Freq: Three times a day (TID) | ORAL | 0 refills | Status: DC | PRN

## 2015-10-15 MED ORDER — SODIUM CHLORIDE 0.9 % IV SOLN
INTRAVENOUS | Status: DC
  Administered 2015-10-15: 100 mL/h via INTRAVENOUS

## 2015-10-15 MED ORDER — IOPAMIDOL (ISOVUE-300) INJECTION 61%
100.0000 mL | Freq: Once | INTRAVENOUS | Status: AC | PRN
  Administered 2015-10-15: 100 mL via INTRAVENOUS

## 2015-10-15 MED ORDER — MORPHINE SULFATE (PF) 2 MG/ML IV SOLN
2.0000 mg | INTRAVENOUS | Status: DC | PRN
  Administered 2015-10-15: 2 mg via INTRAVENOUS
  Filled 2015-10-15: qty 1

## 2015-10-15 MED ORDER — ONDANSETRON HCL 4 MG/2ML IJ SOLN
4.0000 mg | INTRAMUSCULAR | Status: DC | PRN
  Administered 2015-10-15: 4 mg via INTRAVENOUS
  Filled 2015-10-15: qty 2

## 2015-10-15 NOTE — ED Provider Notes (Signed)
[facility] DEPT Provider Note   CSN: [id number] Arrival date & time: 10/15/15  1836  First Provider Contact:  None       History   Chief Complaint Chief Complaint  Patient presents with  . Abdominal Pain    HPI Alexandria Webster is a 50 y.o. female.  HPI  Pt was seen at 1910.  Per pt, c/o gradual onset and persistence of constant generalized abd "pain" for the past 3 days. Has been associated with multiple intermittent episodes of N/V.  Describes the abd pain as "cramping."  Has been associated with "constipation" for the past 1 week. States she has been taking miralax without having a BM.  Denies diarrhea, no fevers, no back pain, no rash, no CP/SOB, no black or blood in stools or emesis.      Past Medical History:  Diagnosis Date  . Asthma   . DDD (degenerative disc disease), lumbar   . Diabetes mellitus   . Hypertension   . PVD (peripheral vascular disease) [facility])     Patient Active Problem List   Diagnosis Date Noted  . Gastritis 10/01/2015  . Gastroparesis due to DM ([place]) 10/01/2015  . Personal history of noncompliance with medical treatment, presenting hazards to health 09/14/2015  . Hypokalemia 11/13/2012  . Unspecified constipation 11/09/2012  . Facial cellulitis 11/07/2012  . Leukocytosis 11/07/2012  . Type 2 diabetes mellitus, uncontrolled ([place]) 11/07/2012  . Asthma 11/07/2012  . PVD (peripheral vascular disease) ([place]) 11/07/2012  . Hyponatremia 11/07/2012  . Tachycardia 11/07/2012  . Nausea & vomiting 11/07/2012  . Essential hypertension, benign     Past Surgical History:  Procedure Laterality Date  . BACK SURGERY    . CORONARY ANGIOPLASTY WITH STENT PLACEMENT    . CORONARY ARTERY BYPASS GRAFT    . FEMORAL ARTERY STENT  right leg    OB History    Gravida Para Term Preterm AB Living   2 1   1 1 1    SAB TAB Ectopic Multiple Live Births   1               Home Medications    Prior to Admission medications   Medication Sig Start Date End  Date Taking? Authorizing Provider  aspirin EC 81 MG tablet Take 81 mg by mouth every morning.    Yes Historical Provider, MD  budesonide-formoterol (SYMBICORT) 160-4.5 MCG/ACT inhaler Inhale 2 puffs into the lungs 2 (two) times daily.   Yes Historical Provider, MD  Carboxymethylcellul-Glycerin (CLEAR EYES FOR DRY EYES OP) Place 2 drops into both eyes 2 (two) times daily.   Yes Historical Provider, MD  esomeprazole (NEXIUM) 20 MG capsule Take 20 mg by mouth daily.    Yes Historical Provider, MD  GARLIC PO Take 1 tablet by mouth daily.   Yes Historical Provider, MD  insulin aspart (NOVOLOG FLEXPEN) 100 UNIT/ML FlexPen Inject 10-16 Units into the skin 3 (three) times daily with meals. Patient taking differently: Inject 5-10 Units into the skin 3 (three) times daily with meals.  10/01/15  Yes Cassandria Anger, MD  Insulin Glargine (TOUJEO SOLOSTAR) 300 UNIT/ML SOPN Inject 30 Units into the skin at bedtime. 10/01/15  Yes Cassandria Anger, MD  lisinopril (PRINIVIL,ZESTRIL) 20 MG tablet Take 20 mg by mouth daily.   Yes Historical Provider, MD  metoCLOPramide (REGLAN) 5 MG tablet Take 1 tablet (5 mg total) by mouth 4 (four) times daily -  before meals and at bedtime. 10/05/15  Yes Donne Hazel, MD  Multiple Vitamin (MULTIVITAMIN WITH MINERALS) TABS tablet Take 1 tablet by mouth daily.   Yes Historical Provider, MD  polyethylene glycol (MIRALAX / GLYCOLAX) packet Take 17 g by mouth daily. 10/05/15  Yes Donne Hazel, MD  Blood Glucose Monitoring Suppl (ACCU-CHEK AVIVA) device Use as instructed 09/28/15   Cassandria Anger, MD  glucose blood (ACCU-CHEK AVIVA) test strip Test glucose 4 times a day 09/28/15   Cassandria Anger, MD  Insulin Pen Needle (B-D UF III MINI PEN NEEDLES) 31G X 5 MM MISC Use qhs 09/17/15   Cassandria Anger, MD  ondansetron (ZOFRAN ODT) 4 MG disintegrating tablet Take 1 tablet (4 mg total) by mouth every 8 (eight) hours as needed for nausea or vomiting. Patient not taking:  Reported on 10/15/2015 09/14/15   Delice Bison Ward, DO    Family History Family History  Problem Relation Age of Onset  . Stroke Mother   . Hypertension Mother   . Heart failure Father     Social History Social History  Substance Use Topics  . Smoking status: Current Every Day Smoker    Packs/day: 0.50    Years: 30.00    Types: Cigarettes  . Smokeless tobacco: Never Used  . Alcohol use No     Allergies   Review of patient's allergies indicates no known allergies.   Review of Systems Review of Systems ROS: Statement: All systems negative except as marked or noted in the HPI; Constitutional: Negative for fever and chills. ; ; Eyes: Negative for eye pain, redness and discharge. ; ; ENMT: Negative for ear pain, hoarseness, nasal congestion, sinus pressure and sore throat. ; ; Cardiovascular: Negative for chest pain, palpitations, diaphoresis, dyspnea and peripheral edema. ; ; Respiratory: Negative for cough, wheezing and stridor. ; ; Gastrointestinal: +N/V, constipation, abd pain. Negative for diarrhea, blood in stool, hematemesis, jaundice and rectal bleeding. . ; ; Genitourinary: Negative for dysuria, flank pain and hematuria. ; ; Musculoskeletal: Negative for back pain and neck pain. Negative for swelling and trauma.; ; Skin: Negative for pruritus, rash, abrasions, blisters, bruising and skin lesion.; ; Neuro: Negative for headache, lightheadedness and neck stiffness. Negative for weakness, altered level of consciousness, altered mental status, extremity weakness, paresthesias, involuntary movement, seizure and syncope.       Physical Exam Updated Vital Signs BP 161/80 (BP Location: Left Arm)   Pulse 84   Temp 98.3 F (36.8 C) (Oral)   Resp 20   Ht 5\' 4"  (1.626 m)   Wt 136 lb (61.7 kg)   SpO2 100%   BMI 23.34 kg/m   Physical Exam 1915; Physical examination:  Nursing notes reviewed; Vital signs and O2 SAT reviewed;  Constitutional: Well developed, Well nourished, Well  hydrated, In no acute distress; Head:  Normocephalic, atraumatic; Eyes: EOMI, PERRL, No scleral icterus; ENMT: Mouth and pharynx normal, Mucous membranes moist; Neck: Supple, Full range of motion, No lymphadenopathy; Cardiovascular: Regular rate and rhythm, No gallop; Respiratory: Breath sounds clear & equal bilaterally, No wheezes.  Speaking full sentences with ease, Normal respiratory effort/excursion; Chest: Nontender, Movement normal; Abdomen: Soft, +diffuse tenderness to palp. No rebound or guarding. Nondistended, Normal bowel sounds; Genitourinary: No CVA tenderness; Extremities: Pulses normal, No tenderness, No edema, No calf edema or asymmetry.; Neuro: AA&Ox3, Major CN grossly intact.  Speech clear. No gross focal motor or sensory deficits in extremities.; Skin: Color normal, Warm, Dry.   ED Treatments / Results  Labs (all labs ordered are listed, but only abnormal results are displayed)  EKG  EKG Interpretation None       Radiology   Procedures Procedures (including critical care time)  Medications Ordered in ED Medications  0.9 %  sodium chloride infusion (not administered)  ondansetron (ZOFRAN) injection 4 mg (not administered)  morphine 2 MG/ML injection 2 mg (not administered)  famotidine (PEPCID) IVPB 20 mg premix (not administered)     Initial Impression / Assessment and Plan / ED Course  I have reviewed the triage vital signs and the nursing notes.  Pertinent labs & imaging results that were available during my care of the patient were reviewed by me and considered in my medical decision making (see chart for details).   MDM Reviewed: vitals, nursing note and previous chart Reviewed previous: labs Interpretation: labs, x-ray and CT scan   Results for orders placed or performed during the hospital encounter of 10/15/15  Lipase, blood  Result Value Ref Range   Lipase 19 11 - 51 U/L  Comprehensive metabolic panel  Result Value Ref Range   Sodium 137 135 -  145 mmol/L   Potassium 3.8 3.5 - 5.1 mmol/L   Chloride 106 101 - 111 mmol/L   CO2 27 22 - 32 mmol/L   Glucose, Bld 237 (H) 65 - 99 mg/dL   BUN 20 6 - 20 mg/dL   Creatinine, Ser 0.57 0.44 - 1.00 mg/dL   Calcium 8.9 8.9 - 10.3 mg/dL   Total Protein 7.2 6.5 - 8.1 g/dL   Albumin 3.7 3.5 - 5.0 g/dL   AST 13 (L) 15 - 41 U/L   ALT 16 14 - 54 U/L   Alkaline Phosphatase 59 38 - 126 U/L   Total Bilirubin 0.3 0.3 - 1.2 mg/dL   GFR calc non Af Amer >60 >60 mL/min   GFR calc Af Amer >60 >60 mL/min   Anion gap 4 (L) 5 - 15  CBC  Result Value Ref Range   WBC 7.6 4.0 - 10.5 K/uL   RBC 3.76 (L) 3.87 - 5.11 MIL/uL   Hemoglobin 12.5 12.0 - 15.0 g/dL   HCT 37.0 36.0 - 46.0 %   MCV 98.4 78.0 - 100.0 fL   MCH 33.2 26.0 - 34.0 pg   MCHC 33.8 30.0 - 36.0 g/dL   RDW 12.5 11.5 - 15.5 %   Platelets 243 150 - 400 K/uL  Urinalysis, Routine w reflex microscopic  Result Value Ref Range   Color, Urine YELLOW YELLOW   APPearance CLEAR CLEAR   Specific Gravity, Urine 1.010 1.005 - 1.030   pH 6.0 5.0 - 8.0   Glucose, UA >1000 (A) NEGATIVE mg/dL   Hgb urine dipstick NEGATIVE NEGATIVE   Bilirubin Urine NEGATIVE NEGATIVE   Ketones, ur NEGATIVE NEGATIVE mg/dL   Protein, ur NEGATIVE NEGATIVE mg/dL   Nitrite NEGATIVE NEGATIVE   Leukocytes, UA NEGATIVE NEGATIVE  Urine microscopic-add on  Result Value Ref Range   Squamous Epithelial / LPF 0-5 (A) NONE SEEN   WBC, UA 0-5 0 - 5 WBC/hpf   RBC / HPF NONE SEEN 0 - 5 RBC/hpf   Bacteria, UA RARE (A) NONE SEEN   Dg Chest 2 View Result Date: 10/15/2015 CLINICAL DATA:  Constipation x 1 week, vomiting x 4 days.Asthma, HTN, DM, smoker-1 pack/daySx: Coronary angioplasty with stent placement - "about 20 years ago" EXAM: CHEST  2 VIEW COMPARISON:  09/14/2015 FINDINGS: The heart size and mediastinal contours are within normal limits. Lungs are clear.  No pleural effusion or pneumothorax. Bony thorax is intact. IMPRESSION:  No active cardiopulmonary disease. Electronically  Signed   By: Lajean Manes M.D.   On: 10/15/2015 20:02    Ct Abdomen Pelvis W Contrast Result Date: 10/15/2015 CLINICAL DATA:  Initial evaluation for acute abdominal pain, nausea, vomiting. Constipation for 1 week. EXAM: CT ABDOMEN AND PELVIS WITH CONTRAST TECHNIQUE: Multidetector CT imaging of the abdomen and pelvis was performed using the standard protocol following bolus administration of intravenous contrast. CONTRAST:  174mL ISOVUE-300 IOPAMIDOL (ISOVUE-300) INJECTION 61% COMPARISON:  Prior radiograph from 10/03/2015 as well as prior CT from 12/21/2014 and CT from 01/22/2012. FINDINGS: Visualized lung bases are clear. Liver demonstrates a normal contrast enhanced appearance. Gallbladder within normal limits. No biliary dilatation. Spleen, adrenal glands, and pancreas demonstrate no acute abnormality. Kidneys are equal in size with symmetric enhancement. No nephrolithiasis, hydronephrosis, or focal enhancing renal mass. Subcentimeter hypodense lesion within the right kidney is too small the characterize, but statistically likely reflects a small cyst. The stomach within normal limits. No evidence for bowel obstruction. Moderate to large amount of retained stool within the colon, suggestive of constipation. No abnormal wall thickening, mucosal enhancement, or inflammatory fat stranding seen about the bowels. No findings to suggest acute appendicitis. Postsurgical changes noted at the anterior abdominal wall with associated small fat containing ventral hernia. Bladder within normal limits. Uterus and ovaries within normal limits. Small parenchymal calcifications noted within the right ovary. No free air or fluid. No pathologically enlarged intra-abdominal or pelvic lymph nodes identified. Prominent atheromatous plaque noted within the intra-abdominal aorta. No frank aneurysm. An aorto bi-iliac bypass graft is in place. Left left external iliac artery likely filling via retrograde flow from the internal iliac  artery. There is flow within the femoral artery distally. Distal reconstitution at the femoral artery. Presumably this is chronic given the postsurgical changes. No acute osseous abnormality. No worrisome lytic or blastic osseous lesions. Moderate multilevel degenerative spondylolysis seen throughout the visualized spine. IMPRESSION: 1. No CT evidence for acute intra-abdominal or pelvic process. 2. Moderate to large amount of retained stool throughout the colon, suggestive of constipation. 3. Aortobifemoral bypass graft in place. Left iliac limb remains occluded. This is similar in appearance relative to prior CT from 03/24/2011. Electronically Signed   By: Jeannine Boga M.D.   On: 10/15/2015 21:24     2130:  Pt has tol PO well while in the ED without N/V.  No stooling while in the ED.  Abd benign, VSS. Feels better and wants to go home now. Dx and testing d/w pt.  Questions answered.  Verb understanding, agreeable to d/c home with outpt f/u.       Final Clinical Impressions(s) / ED Diagnoses   Final diagnoses:  None    New Prescriptions New Prescriptions   No medications on file     Francine Graven, DO 10/18/15 1414

## 2015-10-15 NOTE — ED Triage Notes (Signed)
Pt reports generalized abdominal pain with v/d that began yesterday. Pt reports being unable to have a bowel movement since last week despite taking Miralax daily. Pt states CBG was 112 this morning.

## 2015-10-15 NOTE — ED Notes (Signed)
Pt back from x-ray.

## 2015-10-15 NOTE — Discharge Instructions (Signed)
Take over the counter laxative (such as miralax, milk of magnesia, senokot) AND a dulcolax suppository today and repeat both tomorrow.  Continue to take over the counter stool softener (miralax, colace), as directed on packaging, for the next month.  Continue to take your usual prescriptions as previously directed. Take the prescription as directed.  Increase your fluid intake (ie:  Gatoraide) for the next few days.  Eat a bland diet and advance to your regular diet slowly as you can tolerate it.   Call your regular medical doctor tomorrow to schedule a follow up appointment within the next 3 days.  Return to the Emergency Department immediately if worsening.

## 2015-10-15 NOTE — ED Notes (Signed)
Patient ambulatory to restroom with steady gait, clean catch instructions given and advised pt to bring specimen back to room as well.  

## 2015-10-15 NOTE — Patient Instructions (Addendum)
Goals 1 Follow My Plate 2. Cut out Dt [facility] and drink water 3. Do not skip meals 4. Take meds as prescribed 5. Eat 2-3 carb choices per meals 6. Exercise 15 minutes daily. Get BS down to 8% in three months

## 2015-11-17 ENCOUNTER — Ambulatory Visit: Payer: Self-pay | Admitting: "Endocrinology

## 2015-11-25 ENCOUNTER — Ambulatory Visit: Payer: Self-pay | Admitting: Nutrition

## 2016-01-13 ENCOUNTER — Other Ambulatory Visit (HOSPITAL_COMMUNITY): Payer: Self-pay | Admitting: Internal Medicine

## 2016-01-13 DIAGNOSIS — Z1231 Encounter for screening mammogram for malignant neoplasm of breast: Secondary | ICD-10-CM

## 2016-01-13 DIAGNOSIS — Z780 Asymptomatic menopausal state: Secondary | ICD-10-CM

## 2016-02-01 ENCOUNTER — Encounter (HOSPITAL_COMMUNITY): Payer: Self-pay | Admitting: Radiology

## 2016-02-01 ENCOUNTER — Ambulatory Visit (HOSPITAL_COMMUNITY)
Admission: RE | Admit: 2016-02-01 | Discharge: 2016-02-01 | Disposition: A | Discharge status: Home / Self Care | Payer: BLUE CROSS/BLUE SHIELD | Source: Ambulatory Visit | Attending: Internal Medicine | Admitting: Internal Medicine

## 2016-02-01 DIAGNOSIS — Z1231 Encounter for screening mammogram for malignant neoplasm of breast: Secondary | ICD-10-CM | POA: Insufficient documentation

## 2016-02-01 DIAGNOSIS — Z780 Asymptomatic menopausal state: Secondary | ICD-10-CM

## 2016-02-01 DIAGNOSIS — M85851 Other specified disorders of bone density and structure, right thigh: Secondary | ICD-10-CM | POA: Diagnosis not present

## 2016-04-26 ENCOUNTER — Encounter: Payer: Self-pay | Admitting: *Deleted

## 2016-05-09 ENCOUNTER — Other Ambulatory Visit (HOSPITAL_COMMUNITY)
Admission: RE | Admit: 2016-05-09 | Discharge: 2016-05-09 | Disposition: A | Discharge status: Home / Self Care | Payer: BLUE CROSS/BLUE SHIELD | Source: Ambulatory Visit | Attending: Adult Health | Admitting: Adult Health

## 2016-05-09 ENCOUNTER — Ambulatory Visit (INDEPENDENT_AMBULATORY_CARE_PROVIDER_SITE_OTHER): Payer: BLUE CROSS/BLUE SHIELD | Admitting: Adult Health

## 2016-05-09 ENCOUNTER — Encounter: Payer: Self-pay | Admitting: Adult Health

## 2016-05-09 VITALS — BP 108/72 | HR 80 | Ht 59.0 in | Wt 124.5 lb

## 2016-05-09 DIAGNOSIS — R61 Generalized hyperhidrosis: Secondary | ICD-10-CM | POA: Diagnosis not present

## 2016-05-09 DIAGNOSIS — Z01419 Encounter for gynecological examination (general) (routine) without abnormal findings: Secondary | ICD-10-CM | POA: Insufficient documentation

## 2016-05-09 DIAGNOSIS — N816 Rectocele: Secondary | ICD-10-CM | POA: Diagnosis not present

## 2016-05-09 DIAGNOSIS — Z113 Encounter for screening for infections with a predominantly sexual mode of transmission: Secondary | ICD-10-CM | POA: Insufficient documentation

## 2016-05-09 DIAGNOSIS — Z1151 Encounter for screening for human papillomavirus (HPV): Secondary | ICD-10-CM | POA: Diagnosis not present

## 2016-05-09 DIAGNOSIS — Z1211 Encounter for screening for malignant neoplasm of colon: Secondary | ICD-10-CM | POA: Diagnosis not present

## 2016-05-09 DIAGNOSIS — Z01411 Encounter for gynecological examination (general) (routine) with abnormal findings: Secondary | ICD-10-CM | POA: Diagnosis not present

## 2016-05-09 DIAGNOSIS — Z1212 Encounter for screening for malignant neoplasm of rectum: Secondary | ICD-10-CM

## 2016-05-09 LAB — HEMOCCULT GUIAC POC 1CARD (OFFICE): FECAL OCCULT BLD: NEGATIVE

## 2016-05-09 NOTE — Patient Instructions (Addendum)
Physical in 1 year Pap in 3 if normal  Mammogram yearly Labs with PCP Colonoscopy advised  Take Mevacor a at 4-5 pm to see if helps night sweats, if not call me review handout on [place]

## 2016-05-09 NOTE — Progress Notes (Signed)
Patient ID: Alexandria Webster, female   DOB: 19-Jan-1966, 51 y.o.   MRN: [id number] History of Present Illness: Alexandria Webster is a 1 year old white female, separated,(has boyfriend), in for a well woman gyn exam and pap, and is having sweats that started when she started Mevacor.She is new to this practice. PCP is Dr Maudie Mercury.   Current Medications, Allergies, Past Medical History, Past Surgical History, Family History and Social History were reviewed in [employer] record.     Review of Systems: Patient denies any headaches, hearing loss, fatigue, blurred vision, shortness of breath, chest pain, abdominal pain, problems with bowel movements, urination, or intercourse. No joint pain or mood swings.+night sweats    Physical Exam:BP 108/72 (BP Location: Left Arm, Patient Position: Sitting, Cuff Size: Normal)   Pulse 80   Ht 4\' 11"  (1.499 m)   Wt 124 lb 8 oz (56.5 kg)   LMP 01/21/2012   BMI 25.15 kg/m  General:  Well developed, well nourished, no acute distress Skin:  Warm and dry,hair is thinning and she has several missing teeth Neck:  Midline trachea, normal thyroid, good ROM, no lymphadenopathy Lungs; Clear to auscultation bilaterally Breast:  No dominant palpable mass, retraction, or nipple discharge Cardiovascular: Regular rate and rhythm Abdomen:  Soft, non tender, no hepatosplenomegaly Pelvic:  External genitalia is normal in appearance, no lesions.  The vagina is pale with loss of rugae and moisture. Urethra has no lesions or masses. The cervix is smooth, pap with HPV and GC/CHL performed.  Uterus is felt to be normal size, shape, and contour.  No adnexal masses or tenderness noted.Bladder is non tender, no masses felt. Rectal: Good sphincter tone, no polyps, or hemorrhoids felt.  Hemoccult negative.+rectocele Extremities/musculoskeletal:  No swelling or varicosities noted, no clubbing or cyanosis Psych:  No mood changes, alert and cooperative,seems happy PHQ 2 score  0.Discussed HRT but with her history of PVD and coronary angioplasty, would probably try to use them ,but [facility] is option, but change time taking mevacor first to see if helps, she ws given handout on [facility] to review.  Impression: 1. Encounter for gynecological examination with Papanicolaou smear of cervix   2. Screening for colorectal cancer   3. Night sweats   4.      Rectocele    Plan: Physical in 1 year Pap in 3 if normal  Mammogram yearly Labs with PCP Colonoscopy advised(talk with Dr Maudie Mercury) Take Mevacor a at 4-5 pm to see if affects night sweats, if not call me, review handout on [place]

## 2016-05-11 LAB — CYTOLOGY - PAP
ADEQUACY: ABSENT
Chlamydia: NEGATIVE
Diagnosis: NEGATIVE
HPV (WINDOPATH): NOT DETECTED
Neisseria Gonorrhea: NEGATIVE

## 2016-09-05 ENCOUNTER — Emergency Department (HOSPITAL_COMMUNITY)
Admission: EM | Admit: 2016-09-05 | Discharge: 2016-09-05 | Disposition: A | Discharge status: Home / Self Care | Payer: BLUE CROSS/BLUE SHIELD | Attending: Emergency Medicine | Admitting: Emergency Medicine

## 2016-09-05 ENCOUNTER — Encounter (HOSPITAL_COMMUNITY): Payer: Self-pay | Admitting: *Deleted

## 2016-09-05 DIAGNOSIS — J45909 Unspecified asthma, uncomplicated: Secondary | ICD-10-CM | POA: Insufficient documentation

## 2016-09-05 DIAGNOSIS — R42 Dizziness and giddiness: Secondary | ICD-10-CM | POA: Diagnosis not present

## 2016-09-05 DIAGNOSIS — F17210 Nicotine dependence, cigarettes, uncomplicated: Secondary | ICD-10-CM | POA: Insufficient documentation

## 2016-09-05 DIAGNOSIS — Z794 Long term (current) use of insulin: Secondary | ICD-10-CM | POA: Insufficient documentation

## 2016-09-05 DIAGNOSIS — J449 Chronic obstructive pulmonary disease, unspecified: Secondary | ICD-10-CM | POA: Insufficient documentation

## 2016-09-05 DIAGNOSIS — Z7982 Long term (current) use of aspirin: Secondary | ICD-10-CM | POA: Diagnosis not present

## 2016-09-05 DIAGNOSIS — Z79899 Other long term (current) drug therapy: Secondary | ICD-10-CM | POA: Diagnosis not present

## 2016-09-05 DIAGNOSIS — I10 Essential (primary) hypertension: Secondary | ICD-10-CM | POA: Insufficient documentation

## 2016-09-05 DIAGNOSIS — E119 Type 2 diabetes mellitus without complications: Secondary | ICD-10-CM | POA: Diagnosis not present

## 2016-09-05 LAB — COMPREHENSIVE METABOLIC PANEL
ALBUMIN: 4.1 g/dL (ref 3.5–5.0)
ALK PHOS: 46 U/L (ref 38–126)
ALT: 11 U/L — AB (ref 14–54)
AST: 18 U/L (ref 15–41)
Anion gap: 9 (ref 5–15)
BUN: 21 mg/dL — ABNORMAL HIGH (ref 6–20)
CALCIUM: 9.7 mg/dL (ref 8.9–10.3)
CHLORIDE: 100 mmol/L — AB (ref 101–111)
CO2: 29 mmol/L (ref 22–32)
CREATININE: 0.92 mg/dL (ref 0.44–1.00)
GFR calc Af Amer: >60 mL/min (ref >60)
GFR calc non Af Amer: >60 mL/min (ref >60)
GLUCOSE: 185 mg/dL — AB (ref 65–99)
Potassium: 4.2 mmol/L (ref 3.5–5.1)
SODIUM: 138 mmol/L (ref 135–145)
Total Bilirubin: 0.6 mg/dL (ref 0.3–1.2)
Total Protein: 7.6 g/dL (ref 6.5–8.1)

## 2016-09-05 LAB — CBC WITH DIFFERENTIAL/PLATELET
BASOS ABS: 0 10*3/uL (ref 0.0–0.1)
BASOS PCT: 0 %
EOS ABS: 0.2 10*3/uL (ref 0.0–0.7)
Eosinophils Relative: 2 %
HCT: 40.4 % (ref 36.0–46.0)
HEMOGLOBIN: 14.1 g/dL (ref 12.0–15.0)
Lymphocytes Relative: 22 %
Lymphs Abs: 2 10*3/uL (ref 0.7–4.0)
MCH: 32.4 pg (ref 26.0–34.0)
MCHC: 34.9 g/dL (ref 30.0–36.0)
MCV: 92.9 fL (ref 78.0–100.0)
Monocytes Absolute: 0.7 10*3/uL (ref 0.1–1.0)
Monocytes Relative: 7 %
NEUTROS PCT: 69 %
Neutro Abs: 6.1 10*3/uL (ref 1.7–7.7)
PLATELETS: 209 10*3/uL (ref 150–400)
RBC: 4.35 MIL/uL (ref 3.87–5.11)
RDW: 12.6 % (ref 11.5–15.5)
WBC: 8.9 10*3/uL (ref 4.0–10.5)

## 2016-09-05 MED ORDER — SODIUM CHLORIDE 0.9 % IV BOLUS (SEPSIS)
1000.0000 mL | Freq: Once | INTRAVENOUS | Status: AC
  Administered 2016-09-05: 1000 mL via INTRAVENOUS

## 2016-09-05 NOTE — ED Notes (Signed)
Pt ambulated to bathroom easily and without dizziness

## 2016-09-05 NOTE — ED Notes (Signed)
Pt is stating she feels fine. Has ambulated to the bathroom without difficulty or dizziness multiple times despite low blood pressure readings upon standing.

## 2016-09-05 NOTE — ED Provider Notes (Signed)
[facility] DEPT Provider Note   CSN: [id number] Arrival date & time: 09/05/16  1000     History   Chief Complaint Chief Complaint  Patient presents with  . Dizziness    HPI Alexandria Webster is a 51 y.o. female.  Patient states she was going to the doctor's office for recheck today. She stated she felt fine but at the doctor's office her blood pressure was low so they sent her here   The history is provided by the patient. No language interpreter was used.  Dizziness  Quality:  Lightheadedness Severity:  Mild Onset quality:  Gradual Timing:  Intermittent Progression:  Waxing and waning Associated symptoms: no chest pain, no diarrhea and no headaches     Past Medical History:  Diagnosis Date  . Asthma   . COPD (chronic obstructive pulmonary disease) ([place])   . DDD (degenerative disc disease), lumbar   . Diabetes mellitus   . Gastroparesis   . GERD without esophagitis   . High cholesterol   . Hypertension   . Hypothyroidism   . PVD (peripheral vascular disease) [facility])     Patient Active Problem List   Diagnosis Date Noted  . Gastritis 10/01/2015  . Gastroparesis due to DM ([place]) 10/01/2015  . Personal history of noncompliance with medical treatment, presenting hazards to health 09/14/2015  . Hypokalemia 11/13/2012  . Unspecified constipation 11/09/2012  . Facial cellulitis 11/07/2012  . Leukocytosis 11/07/2012  . Type 2 diabetes mellitus, uncontrolled ([place]) 11/07/2012  . Asthma 11/07/2012  . PVD (peripheral vascular disease) ([place]) 11/07/2012  . Hyponatremia 11/07/2012  . Tachycardia 11/07/2012  . Nausea & vomiting 11/07/2012  . Essential hypertension, benign     Past Surgical History:  Procedure Laterality Date  . BACK SURGERY    . CORONARY ANGIOPLASTY WITH STENT PLACEMENT    . CORONARY ARTERY BYPASS GRAFT    . FEMORAL ARTERY STENT  right leg  . TUBAL LIGATION      OB History    Gravida Para Term Preterm AB Living   2 1   1 1 1    SAB TAB  Ectopic Multiple Live Births   1       1       Home Medications    Prior to Admission medications   Medication Sig Start Date End Date Taking? Authorizing Provider  aspirin EC 81 MG tablet Take 81 mg by mouth every morning.    Yes [provider]  budesonide-formoterol (SYMBICORT) 160-4.5 MCG/ACT inhaler Inhale 2 puffs into the lungs 2 (two) times daily.   Yes [provider]  Carboxymethylcellul-Glycerin (CLEAR EYES FOR DRY EYES OP) Place 2 drops into both eyes 2 (two) times daily.   Yes [provider]  Cholecalciferol (VITAMIN D3) 1000 units CAPS Take by mouth daily.    Yes [provider]  esomeprazole (NEXIUM) 20 MG capsule Take 20 mg by mouth daily.    Yes [provider]  GARLIC PO Take 1 tablet by mouth daily.   Yes [provider]  glucose blood (ACCU-CHEK AVIVA) test strip Test glucose 4 times a day 09/28/15  Yes Nida, Marella Chimes, MD  insulin aspart (NOVOLOG FLEXPEN) 100 UNIT/ML FlexPen Inject 10-16 Units into the skin 3 (three) times daily with meals. Patient taking differently: Inject 20 Units into the skin at bedtime.  10/01/15  Yes Nida, Marella Chimes, MD  Insulin Pen Needle (B-D UF III MINI PEN NEEDLES) 31G X 5 MM MISC Use qhs 09/17/15  Yes Nida,  Marella Chimes, MD  JANUMET XR [phone] MG TB24 Take 1 tablet by mouth daily.  04/30/16  Yes [provider]  levothyroxine (SYNTHROID, LEVOTHROID) 25 MCG tablet Take 25 mcg by mouth daily before breakfast.   Yes [provider]  lisinopril (PRINIVIL,ZESTRIL) 20 MG tablet Take 20 mg by mouth daily.   Yes [provider]  lovastatin (MEVACOR) 20 MG tablet Take 20 mg by mouth at bedtime.   Yes [provider]  metoCLOPramide (REGLAN) 5 MG tablet Take 1 tablet (5 mg total) by mouth 4 (four) times daily -  before meals and at bedtime. 10/05/15  Yes Donne Hazel, MD  Multiple Vitamin (MULTIVITAMIN WITH MINERALS) TABS tablet Take 1 tablet by mouth  daily.   Yes [provider]  polyethylene glycol (MIRALAX / GLYCOLAX) packet Take 17 g by mouth daily. 10/05/15  Yes Donne Hazel, MD    Family History Family History  Problem Relation Age of Onset  . Stroke Mother   . Hypertension Mother   . Heart failure Father   . Asthma Father   . Diabetes Father   . Heart disease Father   . Emphysema Paternal Grandfather     Social History Social History  Substance Use Topics  . Smoking status: Current Every Day Smoker    Packs/day: 0.00    Years: 36.00    Types: Cigarettes  . Smokeless tobacco: Never Used     Comment: smokes half a cig every am  . Alcohol use No     Allergies   Patient has no known allergies.   Review of Systems Review of Systems  Constitutional: Negative for appetite change and fatigue.  HENT: Negative for congestion, ear discharge and sinus pressure.   Eyes: Negative for discharge.  Respiratory: Negative for cough.   Cardiovascular: Negative for chest pain.  Gastrointestinal: Negative for abdominal pain and diarrhea.  Genitourinary: Negative for frequency and hematuria.  Musculoskeletal: Negative for back pain.  Skin: Negative for rash.  Neurological: Positive for dizziness. Negative for seizures and headaches.  Psychiatric/Behavioral: Negative for hallucinations.     Physical Exam Updated Vital Signs BP (!) 88/63 (BP Location: Right Arm)   Pulse (!) 113   Temp 98.3 F (36.8 C) (Oral)   Resp 15   Ht 5\' 4"  (1.626 m)   Wt 59.9 kg (132 lb)   LMP 01/21/2012   SpO2 96%   BMI 22.66 kg/m   Physical Exam  Constitutional: She is oriented to person, place, and time. She appears well-developed.  HENT:  Head: Normocephalic.  Eyes: Conjunctivae and EOM are normal. No scleral icterus.  Neck: Neck supple. No thyromegaly present.  Cardiovascular: Normal rate and regular rhythm.  Exam reveals no gallop and no friction rub.   No murmur heard. Pulmonary/Chest: No stridor. She has no wheezes. She  has no rales. She exhibits no tenderness.  Abdominal: She exhibits no distension. There is no tenderness. There is no rebound.  Musculoskeletal: Normal range of motion. She exhibits no edema.  Lymphadenopathy:    She has no cervical adenopathy.  Neurological: She is oriented to person, place, and time. She exhibits normal muscle tone. Coordination normal.  Skin: No rash noted. No erythema.  Psychiatric: She has a normal mood and affect. Her behavior is normal.     ED Treatments / Results  Labs (all labs ordered are listed, but only abnormal results are displayed) Labs Reviewed  COMPREHENSIVE METABOLIC PANEL - Abnormal; Notable for the following:  Result Value   Chloride 100 (*)    Glucose, Bld 185 (*)    BUN 21 (*)    ALT 11 (*)    All other components within normal limits  CBC WITH DIFFERENTIAL/PLATELET    EKG  EKG Interpretation None       Radiology No results found.  Procedures Procedures (including critical care time)  Medications Ordered in ED Medications  sodium chloride 0.9 % bolus 1,000 mL (1,000 mLs Intravenous New Bag/Given 09/05/16 1528)  sodium chloride 0.9 % bolus 1,000 mL (0 mLs Intravenous Stopped 09/05/16 1320)     Initial Impression / Assessment and Plan / ED Course  I have reviewed the triage vital signs and the nursing notes.  Pertinent labs & imaging results that were available during my care of the patient were reviewed by me and considered in my medical decision making (see chart for details).     Patient with dehydration. Patient improved with IV fluids and will follow-up with her PCP next week Final Clinical Impressions(s) / ED Diagnoses   Final diagnoses:  None    New Prescriptions New Prescriptions   No medications on file     Milton Ferguson, MD 09/05/16 1546

## 2016-09-05 NOTE — ED Notes (Signed)
States she has been dizzy for over a week

## 2016-09-05 NOTE — ED Triage Notes (Signed)
Sent from her PCP for evaluation of low blood pressure, states her pressure was 80 in the office

## 2017-02-06 IMAGING — DX DG CHEST 2V
2 series · 2 of 2 positions shown · non-contrast
Comparison: None.

CLINICAL DATA: Chest pain with sob. htn controlled by meds diabetic
patient states she smokes a half cigarette a day

EXAM:
CHEST  2 VIEW

[chest pa]
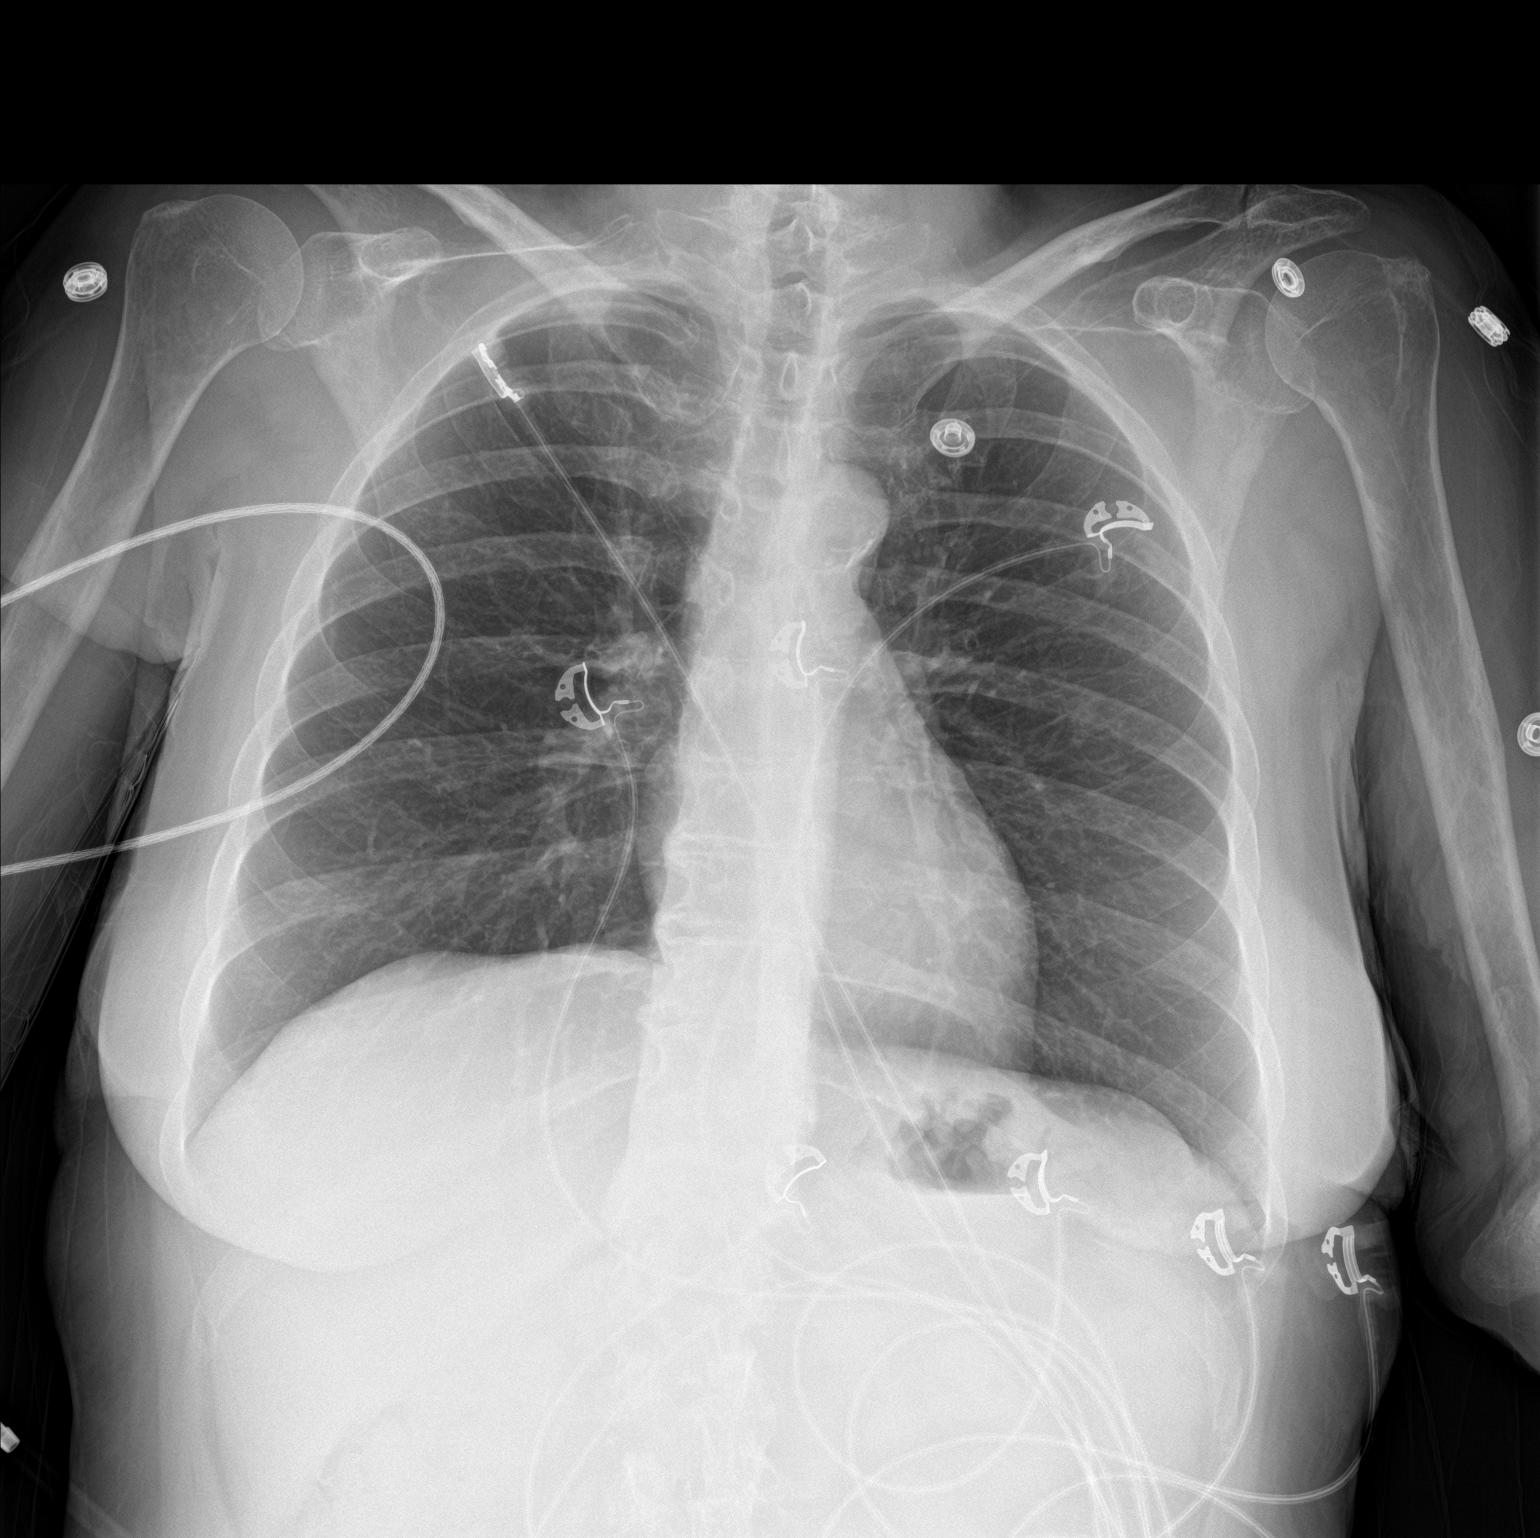

[chest lat]
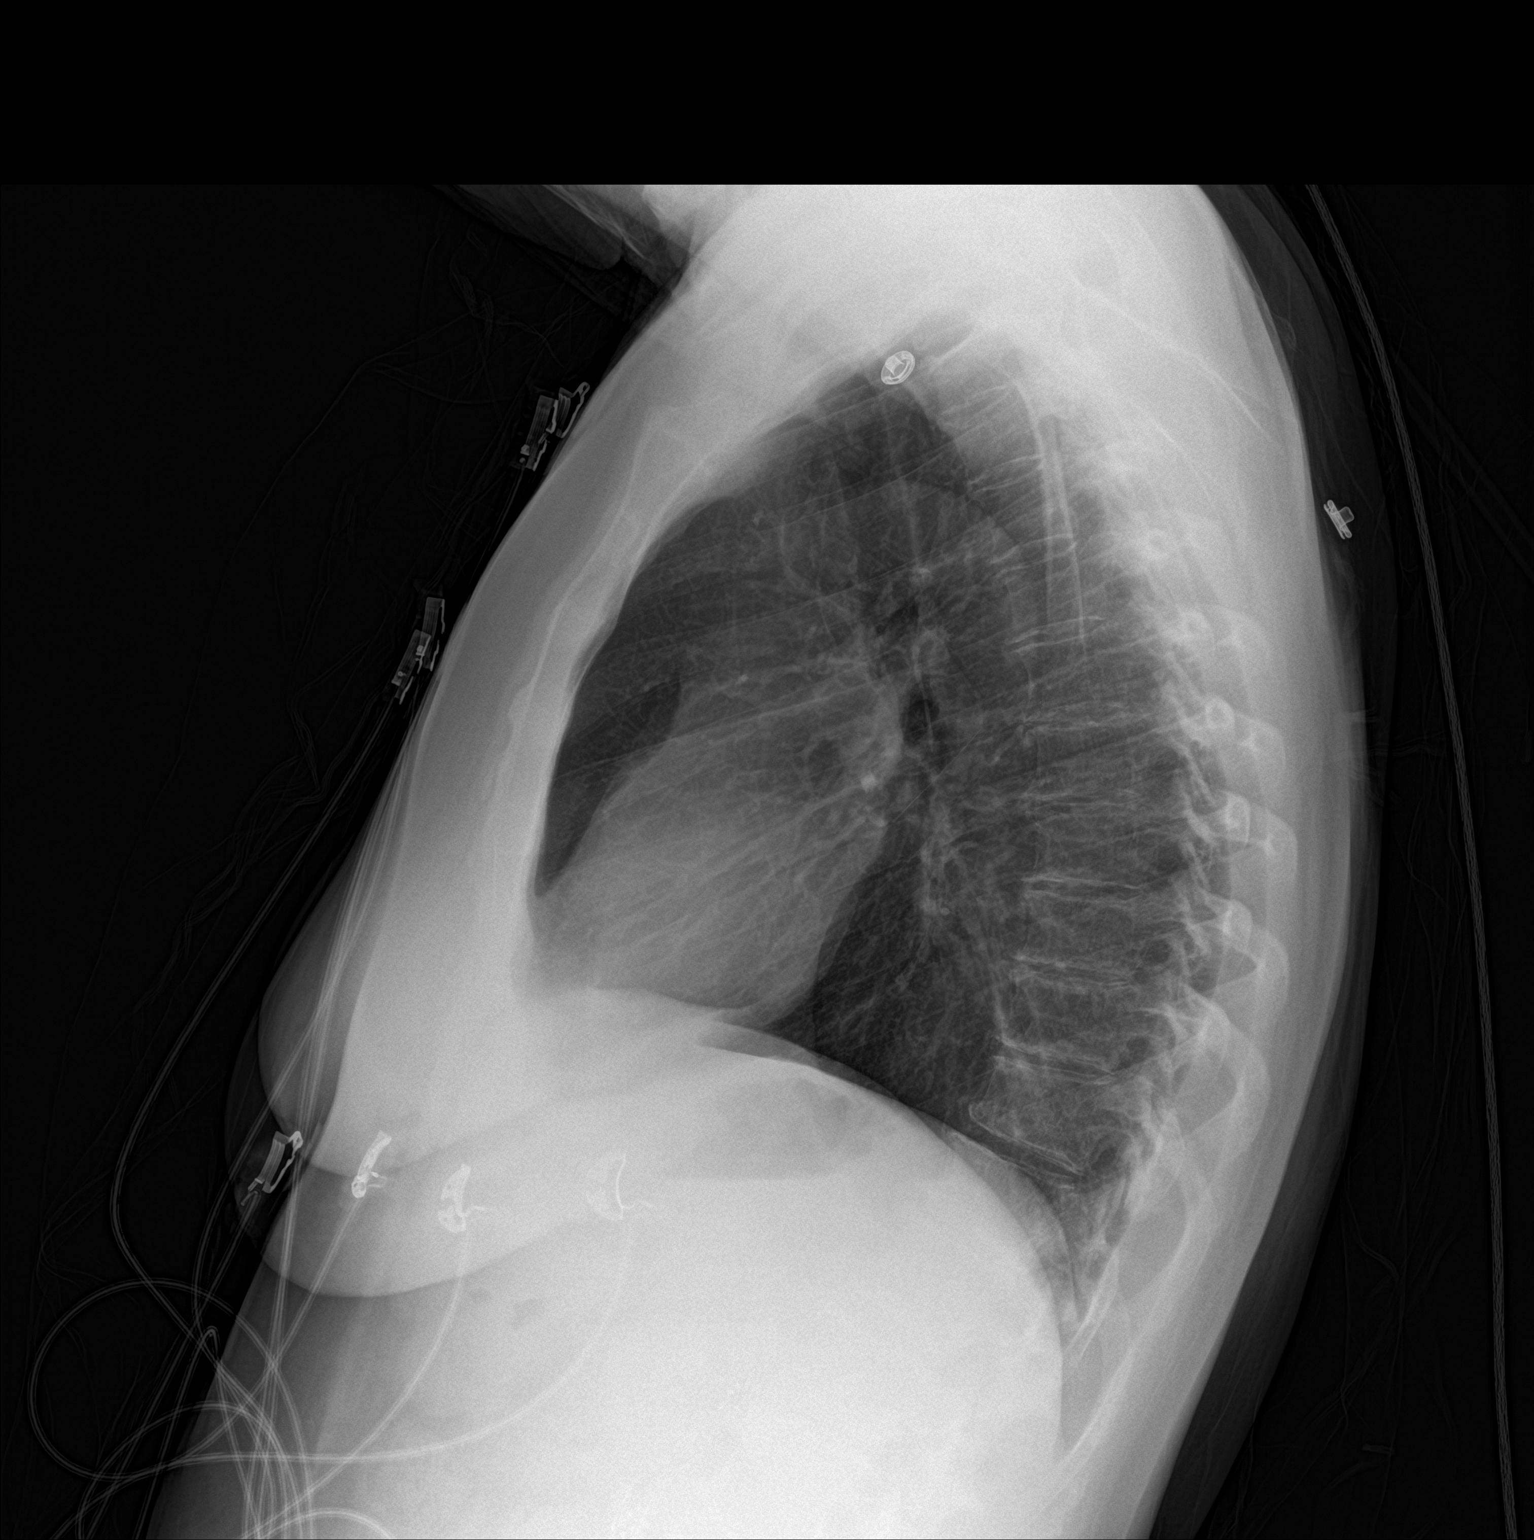

[2 of 2 positions shown; findings below may reference images not displayed]

FINDINGS: Normal mediastinum and cardiac silhouette. Hyperinflated lungs with.
Normal pulmonary vasculature. No evidence of effusion, infiltrate,
or pneumothorax. No acute bony abnormality.
IMPRESSION: Mild lung hyperinflation.  No acute findings

## 2017-03-07 IMAGING — CR DG ABD PORTABLE 1V
1 series · 1 of 1 positions shown · non-contrast
Comparison: None.

CLINICAL DATA: Nausea and vomiting, constipation.

EXAM:
PORTABLE ABDOMEN - 1 VIEW

[supine ap]
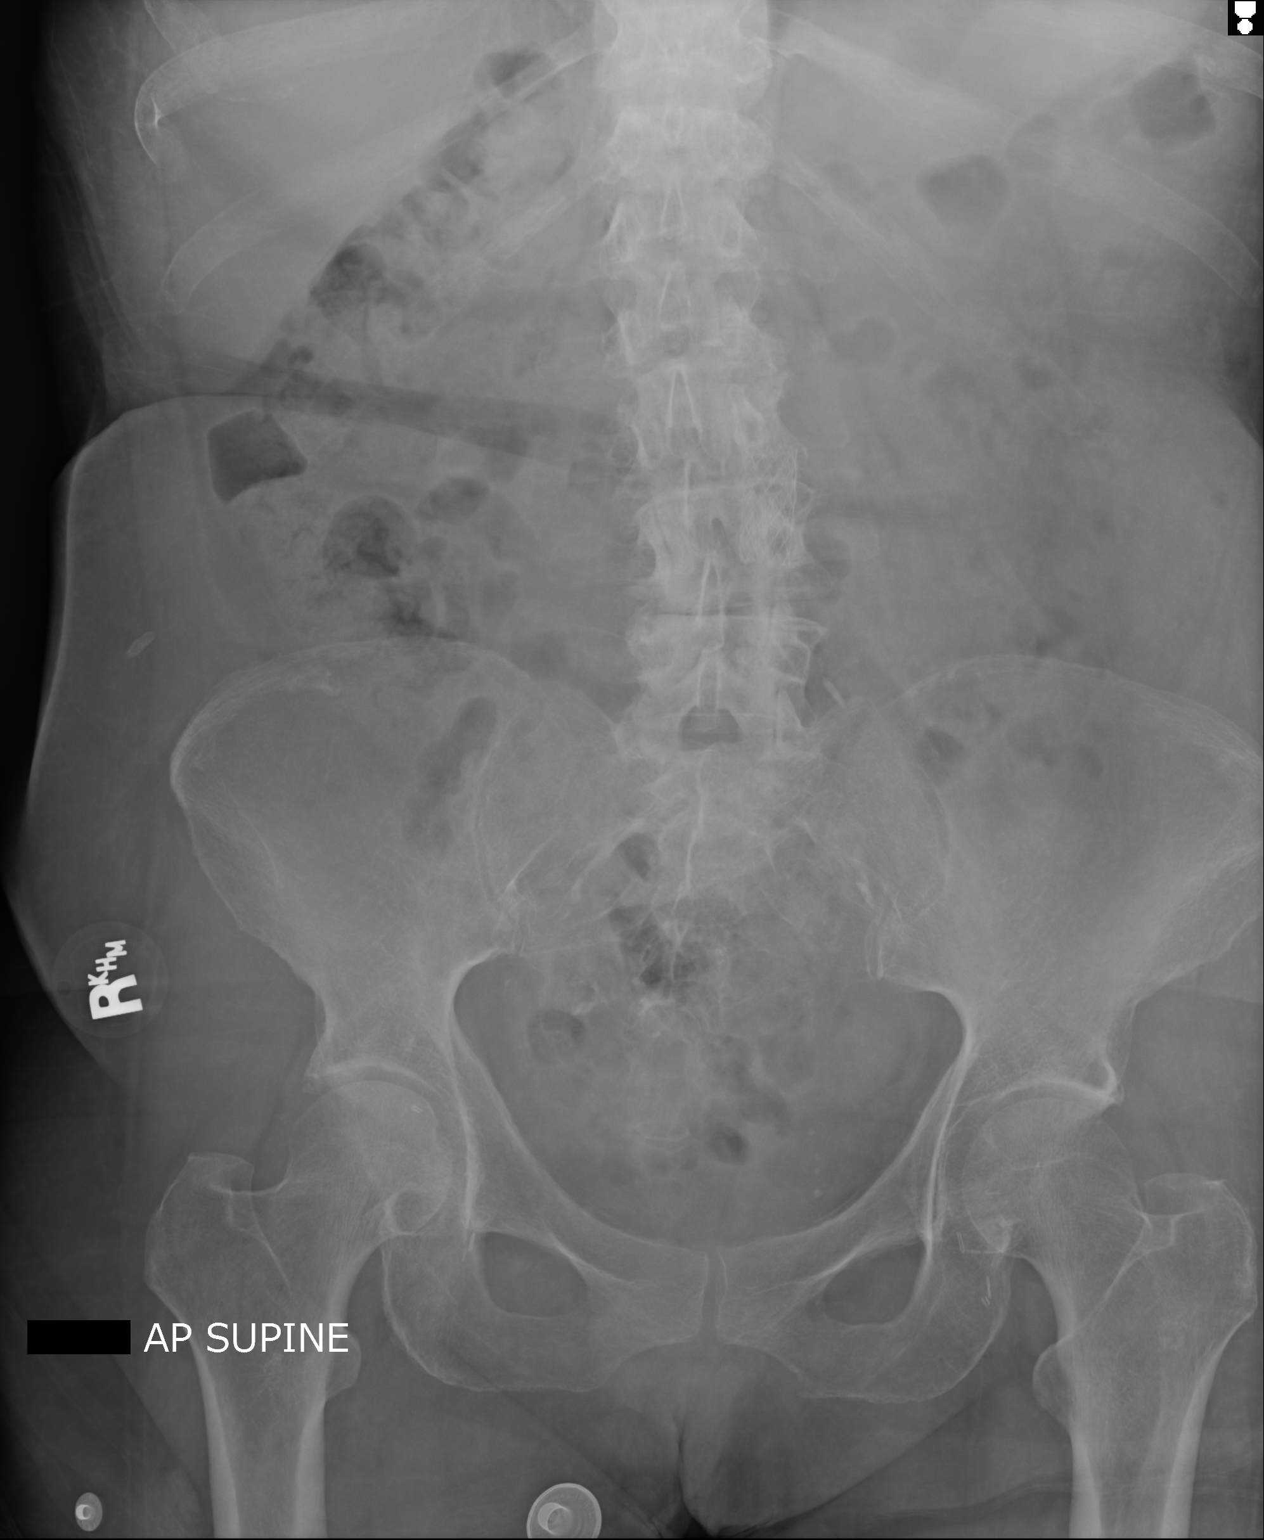

[1 of 1 positions shown; findings below may reference images not displayed]

FINDINGS: Fair amount of stool is seen in the colon. No small bowel
dilatation. No unexpected radiopaque calculi. Vascular stent is seen
in the abdominal midline.
IMPRESSION: Bowel-gas pattern suggests mild constipation.

## 2017-03-19 IMAGING — DX DG CHEST 2V
2 series · 2 of 2 positions shown · non-contrast
Comparison: 09/14/2015

CLINICAL DATA: Constipation x 1 week, vomiting x 4 days.Asthma,
HTN, DM, smoker-3 pack/daySx: Coronary angioplasty with stent
placement - "about 20 years ago"

EXAM:
CHEST  2 VIEW

[chest pa]
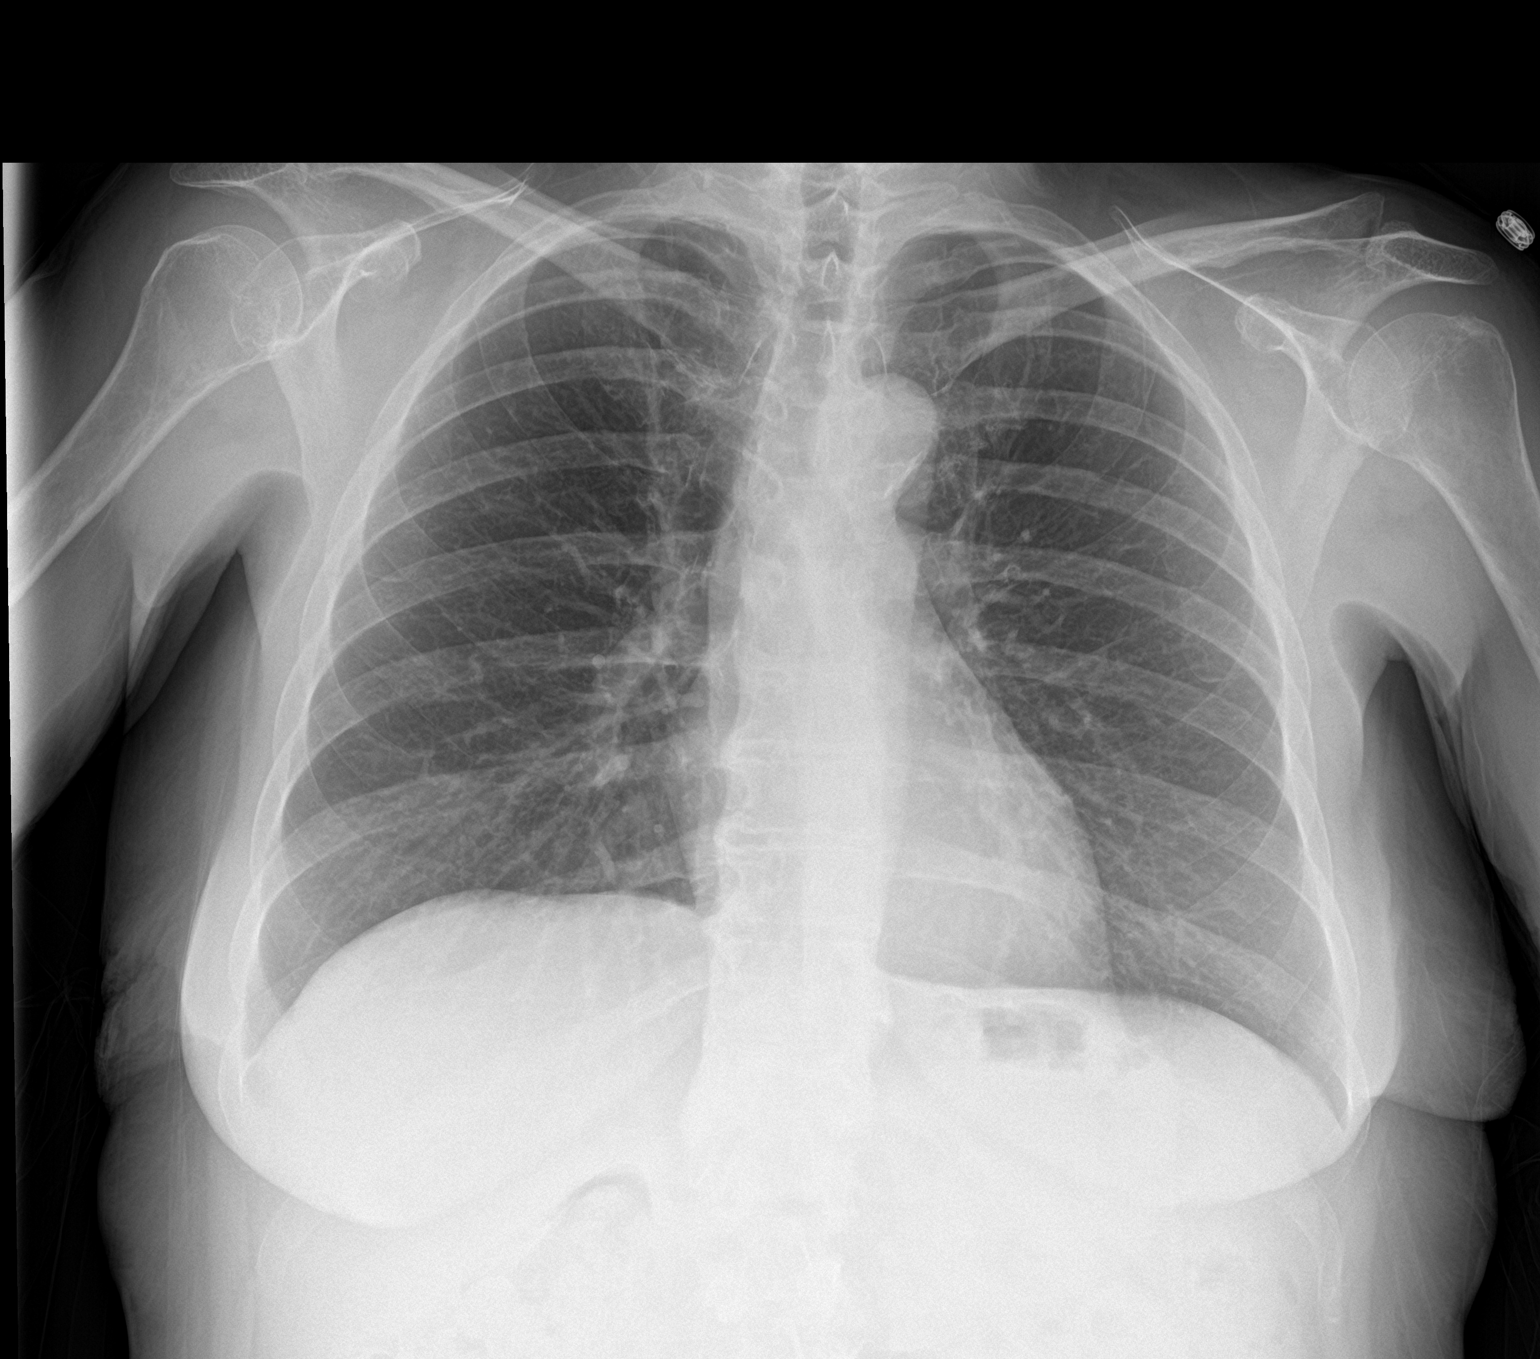

[chest lat]
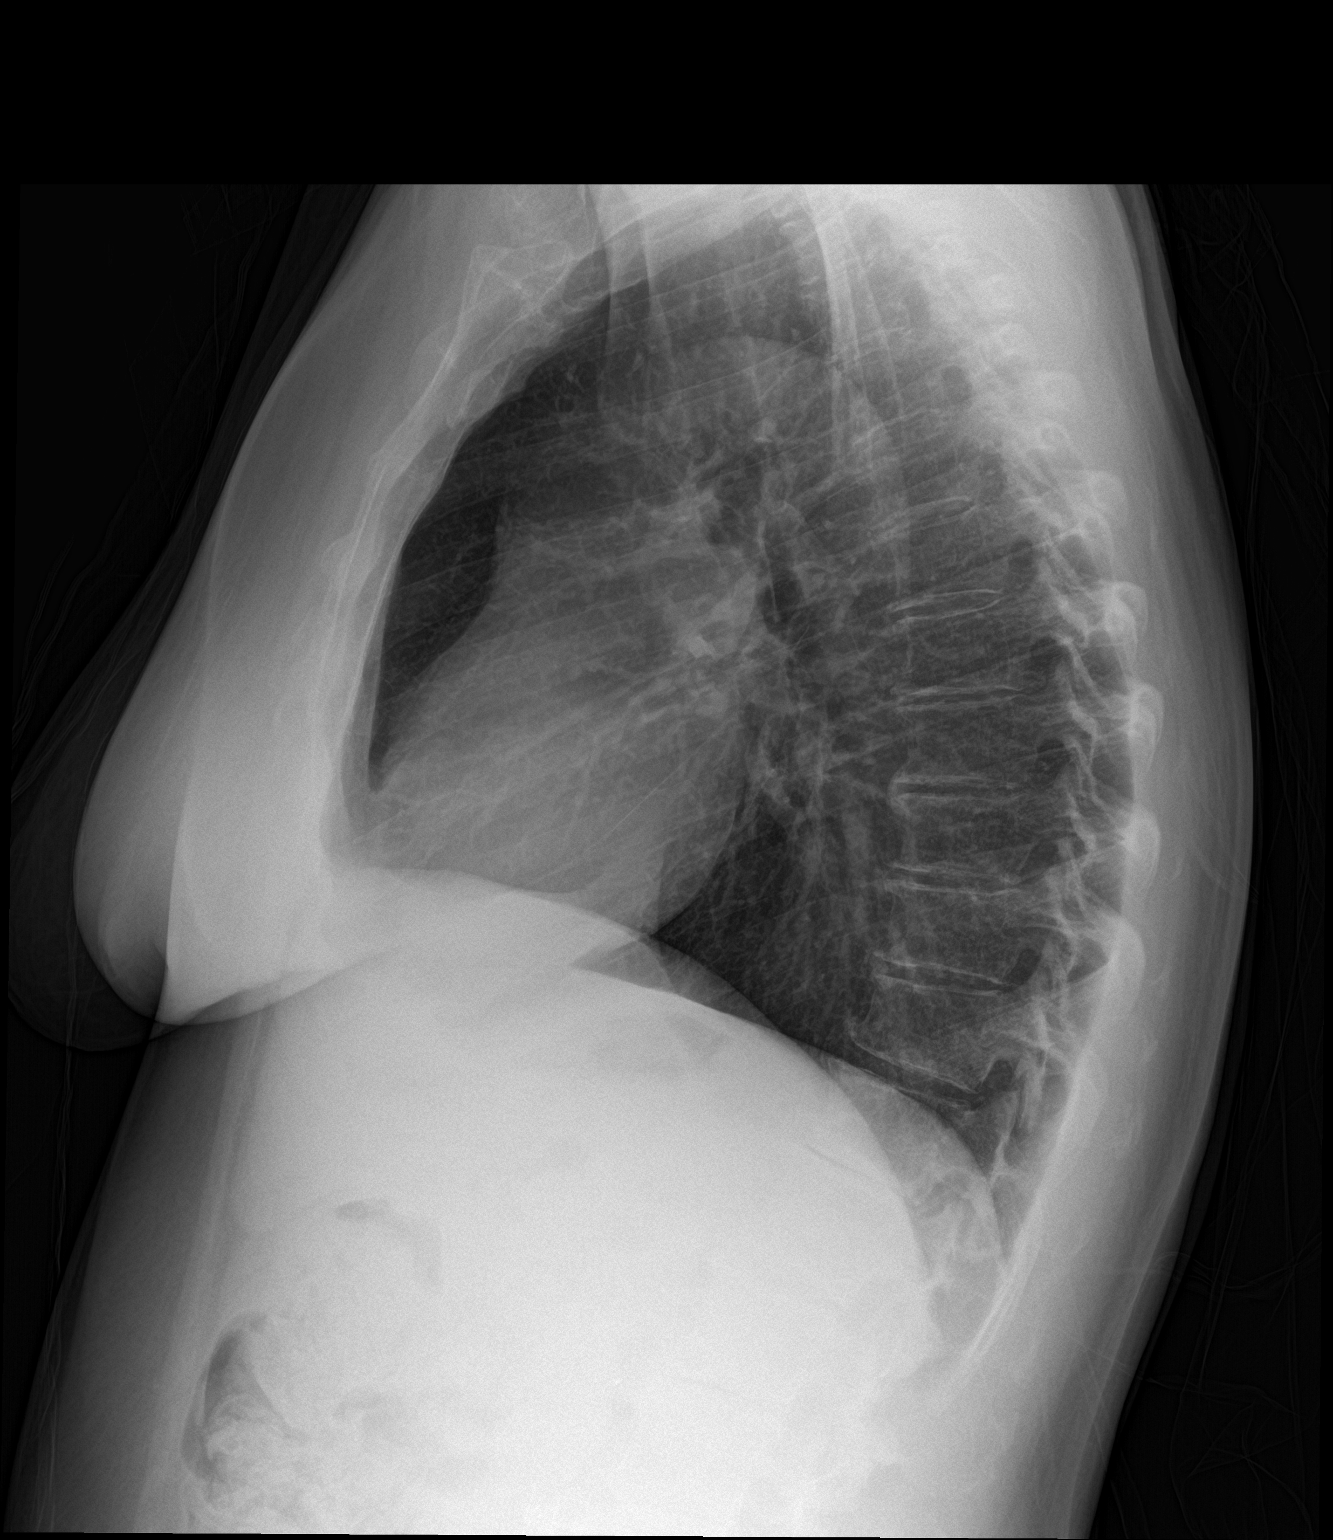

[2 of 2 positions shown; findings below may reference images not displayed]

FINDINGS: The heart size and mediastinal contours are within normal limits.

Lungs are clear.  No pleural effusion or pneumothorax.

Bony thorax is intact.
IMPRESSION: No active cardiopulmonary disease.

## 2018-08-09 ENCOUNTER — Encounter (HOSPITAL_COMMUNITY): Payer: Self-pay

## 2018-08-09 ENCOUNTER — Emergency Department (HOSPITAL_COMMUNITY)
Admission: EM | Admit: 2018-08-09 | Discharge: 2018-08-09 | Disposition: A | Discharge status: Home / Self Care | Payer: BLUE CROSS/BLUE SHIELD | Attending: Emergency Medicine | Admitting: Emergency Medicine

## 2018-08-09 ENCOUNTER — Emergency Department (HOSPITAL_COMMUNITY): Payer: BLUE CROSS/BLUE SHIELD

## 2018-08-09 ENCOUNTER — Other Ambulatory Visit: Payer: Self-pay

## 2018-08-09 DIAGNOSIS — E039 Hypothyroidism, unspecified: Secondary | ICD-10-CM | POA: Insufficient documentation

## 2018-08-09 DIAGNOSIS — J449 Chronic obstructive pulmonary disease, unspecified: Secondary | ICD-10-CM | POA: Diagnosis not present

## 2018-08-09 DIAGNOSIS — E119 Type 2 diabetes mellitus without complications: Secondary | ICD-10-CM | POA: Diagnosis not present

## 2018-08-09 DIAGNOSIS — R05 Cough: Secondary | ICD-10-CM | POA: Diagnosis not present

## 2018-08-09 DIAGNOSIS — Z7982 Long term (current) use of aspirin: Secondary | ICD-10-CM | POA: Insufficient documentation

## 2018-08-09 DIAGNOSIS — Z20828 Contact with and (suspected) exposure to other viral communicable diseases: Secondary | ICD-10-CM | POA: Insufficient documentation

## 2018-08-09 DIAGNOSIS — M546 Pain in thoracic spine: Secondary | ICD-10-CM | POA: Diagnosis not present

## 2018-08-09 DIAGNOSIS — Z794 Long term (current) use of insulin: Secondary | ICD-10-CM | POA: Diagnosis not present

## 2018-08-09 DIAGNOSIS — Z87891 Personal history of nicotine dependence: Secondary | ICD-10-CM | POA: Diagnosis not present

## 2018-08-09 DIAGNOSIS — I10 Essential (primary) hypertension: Secondary | ICD-10-CM | POA: Insufficient documentation

## 2018-08-09 DIAGNOSIS — R1110 Vomiting, unspecified: Secondary | ICD-10-CM | POA: Diagnosis present

## 2018-08-09 DIAGNOSIS — Z79899 Other long term (current) drug therapy: Secondary | ICD-10-CM | POA: Diagnosis not present

## 2018-08-09 LAB — CBC WITH DIFFERENTIAL/PLATELET
Abs Immature Granulocytes: 0.02 10*3/uL (ref 0.00–0.07)
Basophils Absolute: 0 10*3/uL (ref 0.0–0.1)
Basophils Relative: 0 %
Eosinophils Absolute: 0 10*3/uL (ref 0.0–0.5)
Eosinophils Relative: 0 %
HCT: 43.4 % (ref 36.0–46.0)
Hemoglobin: 14.9 g/dL (ref 12.0–15.0)
Immature Granulocytes: 0 %
Lymphocytes Relative: 18 %
Lymphs Abs: 1.4 10*3/uL (ref 0.7–4.0)
MCH: 33.6 pg (ref 26.0–34.0)
MCHC: 34.3 g/dL (ref 30.0–36.0)
MCV: 97.7 fL (ref 80.0–100.0)
Monocytes Absolute: 0.3 10*3/uL (ref 0.1–1.0)
Monocytes Relative: 4 %
Neutro Abs: 5.9 10*3/uL (ref 1.7–7.7)
Neutrophils Relative %: 78 %
Platelets: 239 10*3/uL (ref 150–400)
RBC: 4.44 MIL/uL (ref 3.87–5.11)
RDW: 11.9 % (ref 11.5–15.5)
WBC: 7.7 10*3/uL (ref 4.0–10.5)
nRBC: 0 % (ref 0.0–0.2)

## 2018-08-09 LAB — COMPREHENSIVE METABOLIC PANEL
ALT: 16 U/L (ref 0–44)
AST: 14 U/L — ABNORMAL LOW (ref 15–41)
Albumin: 4.2 g/dL (ref 3.5–5.0)
Alkaline Phosphatase: 68 U/L (ref 38–126)
Anion gap: 15 (ref 5–15)
BUN: 17 mg/dL (ref 6–20)
CO2: 24 mmol/L (ref 22–32)
Calcium: 9.7 mg/dL (ref 8.9–10.3)
Chloride: 97 mmol/L — ABNORMAL LOW (ref 98–111)
Creatinine, Ser: 0.79 mg/dL (ref 0.44–1.00)
GFR calc Af Amer: >60 mL/min (ref >60)
GFR calc non Af Amer: >60 mL/min (ref >60)
Glucose, Bld: 329 mg/dL — ABNORMAL HIGH (ref 70–99)
Potassium: 3.9 mmol/L (ref 3.5–5.1)
Sodium: 136 mmol/L (ref 135–145)
Total Bilirubin: 0.5 mg/dL (ref 0.3–1.2)
Total Protein: 8.6 g/dL — ABNORMAL HIGH (ref 6.5–8.1)

## 2018-08-09 LAB — URINALYSIS, ROUTINE W REFLEX MICROSCOPIC
Bilirubin Urine: NEGATIVE
Glucose, UA: >=500 mg/dL — AB
Ketones, ur: 20 mg/dL — AB
Leukocytes,Ua: NEGATIVE
Nitrite: NEGATIVE
Protein, ur: 100 mg/dL — AB
Specific Gravity, Urine: 1.023 (ref 1.005–1.030)
pH: 5 (ref 5.0–8.0)

## 2018-08-09 LAB — LIPASE, BLOOD: Lipase: 31 U/L (ref 11–51)

## 2018-08-09 LAB — SARS CORONAVIRUS 2 BY RT PCR (HOSPITAL ORDER, PERFORMED IN ~~LOC~~ HOSPITAL LAB): SARS Coronavirus 2: NEGATIVE

## 2018-08-09 MED ORDER — ONDANSETRON HCL 4 MG/2ML IJ SOLN
4.0000 mg | Freq: Once | INTRAMUSCULAR | Status: AC
Start: 2018-08-09 — End: 2018-08-09
  Administered 2018-08-09: 4 mg via INTRAVENOUS
  Filled 2018-08-09: qty 2

## 2018-08-09 MED ORDER — MORPHINE SULFATE (PF) 4 MG/ML IV SOLN
6.0000 mg | Freq: Once | INTRAVENOUS | Status: AC
  Administered 2018-08-09: 6 mg via INTRAVENOUS
  Filled 2018-08-09: qty 2

## 2018-08-09 MED ORDER — HYDROCODONE-ACETAMINOPHEN 5-325 MG PO TABS: 2.0000 | ORAL_TABLET | ORAL | 0 refills | Status: DC | PRN

## 2018-08-09 MED ORDER — SODIUM CHLORIDE 0.9 % IV BOLUS
1000.0000 mL | Freq: Once | INTRAVENOUS | Status: AC
  Administered 2018-08-09: 13:00:00 1000 mL via INTRAVENOUS

## 2018-08-09 MED ORDER — IOHEXOL 350 MG/ML SOLN
100.0000 mL | Freq: Once | INTRAVENOUS | Status: AC | PRN
  Administered 2018-08-09: 100 mL via INTRAVENOUS

## 2018-08-09 NOTE — ED Provider Notes (Signed)
[facility] EMERGENCY DEPARTMENT Provider Note   CSN: [id number] Arrival date & time: 08/09/18  1114    History   Chief Complaint Chief Complaint  Patient presents with  . Vomiting  . Cough    HPI Alexandria Webster is a 53 y.o. female.     HPI   53 year old female with a left mid to lower back pain.  Gradual onset 3 to 4 days ago.  Worse with movement and with coughing.  Mild shortness of breath.  Reports fever to 101 at home but has been taking Tylenol.  Some burning with urination.  Nausea and vomiting.  No change in bowel movements.  No sick contacts that she is aware of.  Past Medical History:  Diagnosis Date  . Asthma   . COPD (chronic obstructive pulmonary disease) ([place])   . DDD (degenerative disc disease), lumbar   . Diabetes mellitus   . Gastroparesis   . GERD without esophagitis   . High cholesterol   . Hypertension   . Hypothyroidism   . PVD (peripheral vascular disease) [facility])     Patient Active Problem List   Diagnosis Date Noted  . Gastritis 10/01/2015  . Gastroparesis due to DM ([place]) 10/01/2015  . Personal history of noncompliance with medical treatment, presenting hazards to health 09/14/2015  . Hypokalemia 11/13/2012  . Unspecified constipation 11/09/2012  . Facial cellulitis 11/07/2012  . Leukocytosis 11/07/2012  . Type 2 diabetes mellitus, uncontrolled ([place]) 11/07/2012  . Asthma 11/07/2012  . PVD (peripheral vascular disease) ([place]) 11/07/2012  . Hyponatremia 11/07/2012  . Tachycardia 11/07/2012  . Nausea & vomiting 11/07/2012  . Essential hypertension, benign     Past Surgical History:  Procedure Laterality Date  . BACK SURGERY    . CORONARY ANGIOPLASTY WITH STENT PLACEMENT    . CORONARY ARTERY BYPASS GRAFT    . FEMORAL ARTERY STENT  right leg  . TUBAL LIGATION       OB History    Gravida  2   Para  1   Term      Preterm  1   AB  1   Living  1     SAB  1   TAB      Ectopic      Multiple      Live Births  1            Home Medications    Prior to Admission medications   Medication Sig Start Date End Date Taking? Authorizing Provider  aspirin EC 81 MG tablet Take 81 mg by mouth every morning.     [provider]  budesonide-formoterol (SYMBICORT) 160-4.5 MCG/ACT inhaler Inhale 2 puffs into the lungs 2 (two) times daily.    [provider]  Carboxymethylcellul-Glycerin (CLEAR EYES FOR DRY EYES OP) Place 2 drops into both eyes 2 (two) times daily.    [provider]  Cholecalciferol (VITAMIN D3) 1000 units CAPS Take by mouth daily.     [provider]  esomeprazole (NEXIUM) 20 MG capsule Take 20 mg by mouth daily.     [provider]  GARLIC PO Take 1 tablet by mouth daily.    [provider]  glucose blood (ACCU-CHEK AVIVA) test strip Test glucose 4 times a day 09/28/15   Cassandria Anger, MD  insulin aspart (NOVOLOG FLEXPEN) 100 UNIT/ML FlexPen Inject 10-16 Units into the skin 3 (three) times daily with meals. Patient taking differently: Inject 20 Units into the skin at bedtime.  10/01/15   Cassandria Anger, MD  Insulin Pen Needle (B-D UF III MINI PEN NEEDLES) 31G X 5 MM MISC Use qhs 09/17/15   Cassandria Anger, MD  JANUMET XR [phone] MG TB24 Take 1 tablet by mouth daily.  04/30/16   [provider]  levothyroxine (SYNTHROID, LEVOTHROID) 25 MCG tablet Take 25 mcg by mouth daily before breakfast.    [provider]  lisinopril (PRINIVIL,ZESTRIL) 20 MG tablet Take 20 mg by mouth daily.    [provider]  lovastatin (MEVACOR) 20 MG tablet Take 20 mg by mouth at bedtime.    [provider]  metoCLOPramide (REGLAN) 5 MG tablet Take 1 tablet (5 mg total) by mouth 4 (four) times daily -  before meals and at bedtime. 10/05/15   Donne Hazel, MD  Multiple Vitamin (MULTIVITAMIN WITH MINERALS) TABS tablet Take 1 tablet by mouth daily.    [provider]  polyethylene glycol (MIRALAX / GLYCOLAX)  packet Take 17 g by mouth daily. 10/05/15   Donne Hazel, MD  albuterol (PROVENTIL HFA;VENTOLIN HFA) 108 (90 Base) MCG/ACT inhaler Inhale 1-2 puffs into the lungs every 6 (six) hours as needed for wheezing or shortness of breath. Patient not taking: Reported on 08/23/2015 05/19/15 08/23/15  Evalee Jefferson, PA-C    Family History Family History  Problem Relation Age of Onset  . Stroke Mother   . Hypertension Mother   . Heart failure Father   . Asthma Father   . Diabetes Father   . Heart disease Father   . Emphysema Paternal Grandfather     Social History Social History   Tobacco Use  . Smoking status: Former Smoker    Packs/day: 0.00    Years: 36.00    Pack years: 0.00    Types: Cigarettes  . Smokeless tobacco: Never Used  . Tobacco comment: smokes half a cig every am  Substance Use Topics  . Alcohol use: No  . Drug use: No     Allergies   Patient has no known allergies.   Review of Systems Review of Systems  All systems reviewed and negative, other than as noted in HPI.  Physical Exam Updated Vital Signs BP (!) 167/65   Pulse 80   Temp 98.9 F (37.2 C)   Wt 51.7 kg   LMP 01/21/2012   SpO2 99%   BMI 19.57 kg/m   Physical Exam Vitals signs and nursing note reviewed.  Constitutional:      General: She is not in acute distress.    Appearance: She is well-developed.  HENT:     Head: Normocephalic and atraumatic.  Eyes:     General:        Right eye: No discharge.        Left eye: No discharge.     Conjunctiva/sclera: Conjunctivae normal.  Neck:     Musculoskeletal: Neck supple.  Cardiovascular:     Rate and Rhythm: Normal rate and regular rhythm.     Heart sounds: Normal heart sounds. No murmur. No friction rub. No gallop.   Pulmonary:     Effort: Pulmonary effort is normal. No respiratory distress.     Breath sounds: Normal breath sounds.  Abdominal:     General: There is no distension.     Palpations: Abdomen is soft.     Tenderness: There is no  abdominal tenderness.  Musculoskeletal:     Comments: Mild tenderness to palpation paraspinally in the left mid to lower thoracic back.  No overlying skin changes.  Skin:    General: Skin is warm and dry.  Neurological:     Mental Status: She is alert.  Psychiatric:        Behavior: Behavior normal.        Thought Content: Thought content normal.      ED Treatments / Results  Labs (all labs ordered are listed, but only abnormal results are displayed) Labs Reviewed  COMPREHENSIVE METABOLIC PANEL - Abnormal; Notable for the following components:      Result Value   Chloride 97 (*)    Glucose, Bld 329 (*)    Total Protein 8.6 (*)    AST 14 (*)    All other components within normal limits  URINALYSIS, ROUTINE W REFLEX MICROSCOPIC - Abnormal; Notable for the following components:   APPearance HAZY (*)    Glucose, UA >=500 (*)    Hgb urine dipstick SMALL (*)    Ketones, ur 20 (*)    Protein, ur 100 (*)    Bacteria, UA RARE (*)    All other components within normal limits  SARS CORONAVIRUS 2 (HOSPITAL ORDER, [facility])  CBC WITH DIFFERENTIAL/PLATELET  LIPASE, BLOOD    EKG None  Radiology Dg Chest Portable 1 View  Result Date: 08/09/2018 CLINICAL DATA:  Cough and fever EXAM: PORTABLE CHEST 1 VIEW COMPARISON:  October 15, 2015 FINDINGS: The lungs are clear. The heart size and pulmonary vascularity are normal. No adenopathy. There is aortic atherosclerosis. There is degenerative change in each shoulder. IMPRESSION: No edema or consolidation. Heart size normal. Aortic Atherosclerosis (ICD10-I70.0). Electronically Signed   By: Lowella Grip III M.D.   On: 08/09/2018 13:04    Procedures Procedures (including critical care time)  Medications Ordered in ED Medications  sodium chloride 0.9 % bolus 1,000 mL (1,000 mLs Intravenous New Bag/Given 08/09/18 1253)  morphine 4 MG/ML injection 6 mg (6 mg Intravenous Given 08/09/18 1253)  ondansetron (ZOFRAN)  injection 4 mg (4 mg Intravenous Given 08/09/18 1253)     Initial Impression / Assessment and Plan / ED Course  I have reviewed the triage vital signs and the nursing notes.  Pertinent labs & imaging results that were available during my care of the patient were reviewed by me and considered in my medical decision making (see chart for details).        53yF with back pain. Imaging as above. I think angiographic findings are incidental. Noted to some degree on prior imaging. Extensive collaterals noted. She has no symptoms whatsoever in lower extremities. Will have her FU routinely with vascular surgery.   Final Clinical Impressions(s) / ED Diagnoses   Final diagnoses:  Acute thoracic back pain, unspecified back pain laterality    ED Discharge Orders    None       Virgel Manifold, MD 08/13/18 1524

## 2018-08-09 NOTE — ED Notes (Signed)
EDP notified of BP 205/67.

## 2018-08-09 NOTE — ED Triage Notes (Signed)
Pt reports cough, fever, vomiting and back pain since x 4 days.  Reports temp 101 this morning and took tylenol pta.

## 2018-09-04 ENCOUNTER — Ambulatory Visit (INDEPENDENT_AMBULATORY_CARE_PROVIDER_SITE_OTHER): Payer: BC Managed Care – PPO | Admitting: Vascular Surgery

## 2018-09-04 ENCOUNTER — Encounter: Payer: Self-pay | Admitting: *Deleted

## 2018-09-04 ENCOUNTER — Other Ambulatory Visit: Payer: Self-pay

## 2018-09-04 ENCOUNTER — Encounter: Payer: Self-pay | Admitting: Vascular Surgery

## 2018-09-04 ENCOUNTER — Other Ambulatory Visit: Payer: Self-pay | Admitting: *Deleted

## 2018-09-04 VITALS — BP 168/77 | HR 83 | Temp 98.9°F | Resp 14 | Ht 59.0 in | Wt 136.0 lb

## 2018-09-04 DIAGNOSIS — I739 Peripheral vascular disease, unspecified: Secondary | ICD-10-CM | POA: Diagnosis not present

## 2018-09-04 DIAGNOSIS — Z01810 Encounter for preprocedural cardiovascular examination: Secondary | ICD-10-CM

## 2018-09-04 DIAGNOSIS — R000 Tachycardia, unspecified: Secondary | ICD-10-CM

## 2018-09-04 DIAGNOSIS — T82868A Thrombosis of vascular prosthetic devices, implants and grafts, initial encounter: Secondary | ICD-10-CM

## 2018-09-04 NOTE — Progress Notes (Signed)
Patient name: Alexandria Webster MRN: [id number] DOB: 04/20/1965 Sex: female  REASON FOR CONSULT: Occluded aortobifemoral bypass  HPI: Alexandria Webster is a 53 y.o. female, with history of diabetes, asthma, hypertension that presents for evaluation of bilateral leg pain.  Patient states claudication-like symptoms started in both lower extremities about 5 months ago.  States she really cannot walk at all and cannot even make it down one aisle in [place].  Ultimately this has been ongoing for at least 5 months and she describes some intermittent numbness in the toes that comes and goes as well.  No tissue loss.  Otherwise motor function intact.  She was evaluated in the emergency room for what she thought was a kidney infection back in May and had a CT scan.  There was a finding of occluded aortobifemoral bypass.  She states that bypass was done here at [facility] by Dr. Kellie Simmering approximately 15 to 20 years ago.  She states she quit smoking about 5 to 6 months ago.  She denies any previous cardiac event.  Denies any chest pain or shortness of breath.  States other than her abdominal aortobifemoral bypass she has had a C-section in the past.  States she cannot live with her legs in this shape.    Past Medical History:  Diagnosis Date  . Asthma   . COPD (chronic obstructive pulmonary disease) ([place])   . DDD (degenerative disc disease), lumbar   . Diabetes mellitus   . Gastroparesis   . GERD without esophagitis   . High cholesterol   . Hypertension   . Hypothyroidism   . PVD (peripheral vascular disease) ([place])     Past Surgical History:  Procedure Laterality Date  . BACK SURGERY    . CORONARY ANGIOPLASTY WITH STENT PLACEMENT    . CORONARY ARTERY BYPASS GRAFT    . FEMORAL ARTERY STENT  right leg  . TUBAL LIGATION      Family History  Problem Relation Age of Onset  . Stroke Mother   . Hypertension Mother   . Heart failure Father   . Asthma Father   . Diabetes Father   . Heart disease  Father   . Emphysema Paternal Grandfather     SOCIAL HISTORY: Social History   Socioeconomic History  . Marital status: Married    Spouse name: Not on file  . Number of children: Not on file  . Years of education: Not on file  . Highest education level: Not on file  Occupational History  . Not on file  Social Needs  . Financial resource strain: Not on file  . Food insecurity    Worry: Not on file    Inability: Not on file  . Transportation needs    Medical: Not on file    Non-medical: Not on file  Tobacco Use  . Smoking status: Former Smoker    Packs/day: 0.00    Years: 36.00    Pack years: 0.00    Types: Cigarettes  . Smokeless tobacco: Never Used  . Tobacco comment: smokes half a cig every am  Substance and Sexual Activity  . Alcohol use: No  . Drug use: No  . Sexual activity: Yes    Birth control/protection: Surgical    Comment: tubal  Lifestyle  . Physical activity    Days per week: Not on file    Minutes per session: Not on file  . Stress: Not on file  Relationships  . Social connections  Talks on phone: Not on file    Gets together: Not on file    Attends religious service: Not on file    Active member of club or organization: Not on file    Attends meetings of clubs or organizations: Not on file    Relationship status: Not on file  . Intimate partner violence    Fear of current or ex partner: Not on file    Emotionally abused: Not on file    Physically abused: Not on file    Forced sexual activity: Not on file  Other Topics Concern  . Not on file  Social History Narrative  . Not on file    No Known Allergies  Current Outpatient Medications  Medication Sig Dispense Refill  . aspirin EC 81 MG tablet Take 81 mg by mouth every morning.     . budesonide-formoterol (SYMBICORT) 160-4.5 MCG/ACT inhaler Inhale 2 puffs into the lungs 2 (two) times daily.    . Carboxymethylcellul-Glycerin (CLEAR EYES FOR DRY EYES OP) Place 2 drops into both eyes 2  (two) times daily.    . Cholecalciferol (VITAMIN D3) 1000 units CAPS Take 1 capsule by mouth 2 (two) times a day.     . esomeprazole (NEXIUM) 20 MG capsule Take 20 mg by mouth daily.     Marland Kitchen GARLIC PO Take 1 tablet by mouth daily.    Marland Kitchen glucose blood (ACCU-CHEK AVIVA) test strip Test glucose 4 times a day 150 each 3  . insulin aspart (NOVOLOG FLEXPEN) 100 UNIT/ML FlexPen Inject 10-16 Units into the skin 3 (three) times daily with meals. (Patient taking differently: Inject 20 Units into the skin at bedtime. ) 15 mL 2  . Insulin Pen Needle (B-D UF III MINI PEN NEEDLES) 31G X 5 MM MISC Use qhs 100 each 5  . JANUMET XR [phone] MG TB24 Take 1 tablet by mouth daily.     Marland Kitchen levothyroxine (SYNTHROID, LEVOTHROID) 25 MCG tablet Take 25 mcg by mouth daily before breakfast.    . lisinopril (PRINIVIL,ZESTRIL) 20 MG tablet Take 20 mg by mouth daily.    Marland Kitchen lovastatin (MEVACOR) 20 MG tablet Take 20 mg by mouth at bedtime.    . metoCLOPramide (REGLAN) 5 MG tablet Take 1 tablet (5 mg total) by mouth 4 (four) times daily -  before meals and at bedtime. 90 tablet 0  . Multiple Vitamin (MULTIVITAMIN WITH MINERALS) TABS tablet Take 1 tablet by mouth daily.    . polyethylene glycol (MIRALAX / GLYCOLAX) packet Take 17 g by mouth daily. 14 each 0  . HYDROcodone-acetaminophen (NORCO/VICODIN) 5-325 MG tablet Take 2 tablets by mouth every 4 (four) hours as needed. (Patient not taking: Reported on 09/04/2018) 10 tablet 0   No current facility-administered medications for this visit.     REVIEW OF SYSTEMS:  [X]  denotes positive finding, [ ]  denotes negative finding Cardiac  Comments:  Chest pain or chest pressure:    Shortness of breath upon exertion:    Short of breath when lying flat:    Irregular heart rhythm:        Vascular    Pain in calf, thigh, or hip brought on by ambulation: x Both legs  Pain in feet at night that wakes you up from your sleep:     Blood clot in your veins:    Leg swelling:         Pulmonary     Oxygen at home:    Productive cough:  Wheezing:         Neurologic    Sudden weakness in arms or legs:     Sudden numbness in arms or legs:     Sudden onset of difficulty speaking or slurred speech:    Temporary loss of vision in one eye:     Problems with dizziness:         Gastrointestinal    Blood in stool:     Vomited blood:         Genitourinary    Burning when urinating:     Blood in urine:        Psychiatric    Major depression:         Hematologic    Bleeding problems:    Problems with blood clotting too easily:        Skin    Rashes or ulcers:        Constitutional    Fever or chills:      PHYSICAL EXAM: Vitals:   09/04/18 1116  BP: (!) 168/77  Pulse: 83  Resp: 14  Temp: 98.9 F (37.2 C)  TempSrc: Temporal  SpO2: 100%  Weight: 136 lb (61.7 kg)  Height: 4\' 11"  (1.499 m)    GENERAL: She appears older than stated age. CARDIAC: There is a regular rate and rhythm.  VASCULAR:  Radial pulse palpable both upper extremities No palpable femoral pulses Vertical groin incisions in both groins healed Monophasic DP signals bilateral lower extremities No tissue loss or mottling, can move her toes PULMONARY: There is good air exchange bilaterally without wheezing or rales. ABDOMEN: Soft and non-tender with normal pitched bowel sounds.  Well healed midline incision MUSCULOSKELETAL: There are no major deformities or cyanosis. NEUROLOGIC: No focal weakness or paresthesias are detected. SKIN: There are no ulcers or rashes noted. PSYCHIATRIC: The patient has a normal affect.  DATA:   I independently reviewed her CTA abdomen pelvis from 08/09/2018 and this shows a aortobifemoral bypass sewn end-to-end.  The aorta is occluded just below the level of the renal arteries and entire length of the aortobifemoral graft is occluded.  She does reconstitute her native external iliacs and common femorals/proximal SFA/profunda patent bilaterally.  Assessment/Plan:  25  old female who presents with occluded aortobifemoral bypass that was done by Dr. Kellie Simmering approximately 15 to 20 years ago.  She has been having progressive lower extremity claudication symptoms over the last 5 months now to the point that she cannot even walk down half an aisle at [place].  She does not have any evidence of tissue loss or rest pain at this time.  She states she cannot go on living her life in her current state.  Given her age of 28 I do not think a axillary bifemoral bypass will be durable.  I have recommended a redo aortobifemoral bypass.  I explained to her in detail that this is a very involved and invasive operation that carries a high risk of morbidity and mortality particularly given the redo nature of the operation.  I quoted her >30% risk of major complication including renal failure require dialysis, bleeding requiring return to OR, graft infection, wound failure, bowel injury, and even risk of death.  She wants to proceed and states she cannot live in this state.  I will get an echocardiogram to ensure she can tolerate open aortic surgery again.  No history of cardiac event and denies CP/SOB, etc.  Will schedule surgery for 09/27/18.   Marty Heck, MD  [facility] Office: [phone] Pager: [phone]

## 2018-09-06 ENCOUNTER — Ambulatory Visit (HOSPITAL_COMMUNITY)
Admission: RE | Admit: 2018-09-06 | Discharge: 2018-09-06 | Disposition: A | Discharge status: Home / Self Care | Payer: BC Managed Care – PPO | Source: Ambulatory Visit | Attending: Vascular Surgery | Admitting: Vascular Surgery

## 2018-09-06 ENCOUNTER — Other Ambulatory Visit: Payer: Self-pay

## 2018-09-06 DIAGNOSIS — R000 Tachycardia, unspecified: Secondary | ICD-10-CM

## 2018-09-06 DIAGNOSIS — Z01810 Encounter for preprocedural cardiovascular examination: Secondary | ICD-10-CM | POA: Diagnosis not present

## 2018-09-06 NOTE — Progress Notes (Signed)
*  PRELIMINARY RESULTS* Echocardiogram 2D Echocardiogram has been performed.  Alexandria Webster 09/06/2018, 3:52 PM

## 2018-09-07 ENCOUNTER — Other Ambulatory Visit: Payer: Self-pay | Admitting: *Deleted

## 2018-09-21 NOTE — Pre-Procedure Instructions (Signed)
Alexandria Webster  09/21/2018      [place], [address] [place] [address] Phone: [phone] Fax: [phone]    Your procedure is scheduled on July 16  Report to [facility] entrance A at 5:30 A.M.  Call this number if you have problems the morning of surgery:  [phone]   Remember:  Do not eat or drink after midnight.      Take these medicines the morning of surgery with A SIP OF WATER :              Aspirin             symbicort--bring to hospital             Esomeprazole (nexium)             Levothyroxine (synthroid)                          7 days prior to surgery STOP taking  Aleve, Naproxen, Ibuprofen, Motrin, Advil, Goody's, BC's, all herbal medications, fish oil, and all vitamins.      How to Manage Your Diabetes Before and After Surgery  Why is it important to control my blood sugar before and after surgery? . Improving blood sugar levels before and after surgery helps healing and can limit problems. . A way of improving blood sugar control is eating a healthy diet by: o  Eating less sugar and carbohydrates o  Increasing activity/exercise o  Talking with your doctor about reaching your blood sugar goals . High blood sugars (greater than 180 mg/dL) can raise your risk of infections and slow your recovery, so you will need to focus on controlling your diabetes during the weeks before surgery. . Make sure that the doctor who takes care of your diabetes knows about your planned surgery including the date and location.  How do I manage my blood sugar before surgery? . Check your blood sugar at least 4 times a day, starting 2 days before surgery, to make sure that the level is not too high or low. o Check your blood sugar the morning of your surgery when you wake up and every 2 hours until you get to the Short Stay unit. . If your blood sugar is less than 70 mg/dL, you will need to treat for low  blood sugar: o Do not take insulin. o Treat a low blood sugar (less than 70 mg/dL) with  cup of clear juice (cranberry or apple), 4 glucose tablets, OR glucose gel. Recheck blood sugar in 15 minutes after treatment (to make sure it is greater than 70 mg/dL). If your blood sugar is not greater than 70 mg/dL on recheck, call [phone] o  for further instructions. . Report your blood sugar to the short stay nurse when you get to Short Stay.  . If you are admitted to the hospital after surgery: o Your blood sugar will be checked by the staff and you will probably be given insulin after surgery (instead of oral diabetes medicines) to make sure you have good blood sugar levels. o The goal for blood sugar control after surgery is 80-180 mg/dL.        WHAT DO I DO ABOUT MY DIABETES MEDICATION?   Marland Kitchen Do not take oral diabetes medicines (pills) the morning of surgery.    . If your CBG is greater than 220 mg/dL,  you may take  of your sliding scale (correction) dose of insulin.      Do not wear jewelry, make-up or nail polish.  Do not wear lotions, powders, or perfumes, or deodorant.  Do not shave 48 hours prior to surgery.  Men may shave face and neck.  Do not bring valuables to the hospital.  [facility] is not responsible for any belongings or valuables.  Contacts, dentures or bridgework may not be worn into surgery.  Leave your suitcase in the car.  After surgery it may be brought to your room.  For patients admitted to the hospital, discharge time will be determined by your treatment team.  Patients discharged the day of surgery will not be allowed to drive home.    Special instructions:  [place] [facility]- Preparing For Surgery  Before surgery, you can play an important role. Because skin is not sterile, your skin needs to be as free of germs as possible. You can reduce the number of germs on your skin by washing with CHG (chlorahexidine gluconate) Soap before surgery.  CHG is an  antiseptic cleaner which kills germs and bonds with the skin to continue killing germs even after washing.    Oral Hygiene is also important to reduce your risk of infection.  Remember - BRUSH YOUR TEETH THE MORNING OF SURGERY WITH YOUR REGULAR TOOTHPASTE  Please do not use if you have an allergy to CHG or antibacterial soaps. If your skin becomes reddened/irritated stop using the CHG.  Do not shave (including legs and underarms) for at least 48 hours prior to first CHG shower. It is OK to shave your face.  Please follow these instructions carefully.   1. Shower the NIGHT BEFORE SURGERY and the MORNING OF SURGERY with CHG.   2. If you chose to wash your hair, wash your hair first as usual with your normal shampoo.  3. After you shampoo, rinse your hair and body thoroughly to remove the shampoo.  4. Use CHG as you would any other liquid soap. You can apply CHG directly to the skin and wash gently with a scrungie or a clean washcloth.   5. Apply the CHG Soap to your body ONLY FROM THE NECK DOWN.  Do not use on open wounds or open sores. Avoid contact with your eyes, ears, mouth and genitals (private parts). Wash Face and genitals (private parts)  with your normal soap.  6. Wash thoroughly, paying special attention to the area where your surgery will be performed.  7. Thoroughly rinse your body with warm water from the neck down.  8. DO NOT shower/wash with your normal soap after using and rinsing off the CHG Soap.  9. Pat yourself dry with a CLEAN TOWEL.  10. Wear CLEAN PAJAMAS to bed the night before surgery, wear comfortable clothes the morning of surgery  11. Place CLEAN SHEETS on your bed the night of your first shower and DO NOT SLEEP WITH PETS.    Day of Surgery:  Do not apply any deodorants/lotions.  Please wear clean clothes to the hospital/surgery center.   Remember to brush your teeth WITH YOUR REGULAR TOOTHPASTE.    Please read over the following fact sheets that you  were given. Coughing and Deep Breathing, MRSA Information and Surgical Site Infection Prevention

## 2018-09-24 ENCOUNTER — Other Ambulatory Visit (HOSPITAL_COMMUNITY)
Admission: RE | Admit: 2018-09-24 | Discharge: 2018-09-24 | Disposition: A | Discharge status: Home / Self Care | Payer: BC Managed Care – PPO | Source: Ambulatory Visit | Attending: Vascular Surgery | Admitting: Vascular Surgery

## 2018-09-24 ENCOUNTER — Encounter (HOSPITAL_COMMUNITY): Payer: Self-pay

## 2018-09-24 ENCOUNTER — Encounter (HOSPITAL_COMMUNITY)
Admission: RE | Admit: 2018-09-24 | Discharge: 2018-09-24 | Disposition: A | Discharge status: Home / Self Care | Payer: BC Managed Care – PPO | Source: Ambulatory Visit | Attending: Vascular Surgery | Admitting: Vascular Surgery

## 2018-09-24 ENCOUNTER — Other Ambulatory Visit: Payer: Self-pay

## 2018-09-24 ENCOUNTER — Telehealth: Payer: Self-pay | Admitting: *Deleted

## 2018-09-24 DIAGNOSIS — E119 Type 2 diabetes mellitus without complications: Secondary | ICD-10-CM | POA: Insufficient documentation

## 2018-09-24 DIAGNOSIS — Z1159 Encounter for screening for other viral diseases: Secondary | ICD-10-CM | POA: Diagnosis not present

## 2018-09-24 DIAGNOSIS — Z01818 Encounter for other preprocedural examination: Secondary | ICD-10-CM | POA: Insufficient documentation

## 2018-09-24 HISTORY — DX: Other specified postprocedural states: Z98.890

## 2018-09-24 HISTORY — DX: Other specified postprocedural states: R11.2

## 2018-09-24 HISTORY — DX: Presence of spectacles and contact lenses: Z97.3

## 2018-09-24 HISTORY — DX: Nausea with vomiting, unspecified: R11.2

## 2018-09-24 LAB — COMPREHENSIVE METABOLIC PANEL
ALT: 15 U/L (ref 0–44)
AST: 20 U/L (ref 15–41)
Albumin: 3.6 g/dL (ref 3.5–5.0)
Alkaline Phosphatase: 60 U/L (ref 38–126)
Anion gap: 11 (ref 5–15)
BUN: 16 mg/dL (ref 6–20)
CO2: 22 mmol/L (ref 22–32)
Calcium: 9.5 mg/dL (ref 8.9–10.3)
Chloride: 100 mmol/L (ref 98–111)
Creatinine, Ser: 0.98 mg/dL (ref 0.44–1.00)
GFR calc Af Amer: >60 mL/min (ref >60)
GFR calc non Af Amer: >60 mL/min (ref >60)
Glucose, Bld: 550 mg/dL (ref 70–99)
Potassium: 4.4 mmol/L (ref 3.5–5.1)
Sodium: 133 mmol/L — ABNORMAL LOW (ref 135–145)
Total Bilirubin: 0.9 mg/dL (ref 0.3–1.2)
Total Protein: 7 g/dL (ref 6.5–8.1)

## 2018-09-24 LAB — CBC
HCT: 39.6 % (ref 36.0–46.0)
Hemoglobin: 14 g/dL (ref 12.0–15.0)
MCH: 33.7 pg (ref 26.0–34.0)
MCHC: 35.4 g/dL (ref 30.0–36.0)
MCV: 95.2 fL (ref 80.0–100.0)
Platelets: 229 10*3/uL (ref 150–400)
RBC: 4.16 MIL/uL (ref 3.87–5.11)
RDW: 11.4 % — ABNORMAL LOW (ref 11.5–15.5)
WBC: 7.1 10*3/uL (ref 4.0–10.5)
nRBC: 0 % (ref 0.0–0.2)

## 2018-09-24 LAB — URINALYSIS, ROUTINE W REFLEX MICROSCOPIC
Bacteria, UA: NONE SEEN
Bilirubin Urine: NEGATIVE
Glucose, UA: >=500 mg/dL — AB
Hgb urine dipstick: NEGATIVE
Ketones, ur: 5 mg/dL — AB
Leukocytes,Ua: NEGATIVE
Nitrite: NEGATIVE
Protein, ur: 30 mg/dL — AB
Specific Gravity, Urine: 1.027 (ref 1.005–1.030)
pH: 6 (ref 5.0–8.0)

## 2018-09-24 LAB — BLOOD GAS, ARTERIAL
Acid-Base Excess: 1.7 mmol/L (ref 0.0–2.0)
Bicarbonate: 25.8 mmol/L (ref 20.0–28.0)
Drawn by: 421801
O2 Saturation: 94.5 %
Patient temperature: 98.6
pCO2 arterial: 40.9 mmHg (ref 32.0–48.0)
pH, Arterial: 7.416 (ref 7.350–7.450)
pO2, Arterial: 73.7 mmHg — ABNORMAL LOW (ref 83.0–108.0)

## 2018-09-24 LAB — HEMOGLOBIN A1C
Hgb A1c MFr Bld: 11.5 % — ABNORMAL HIGH (ref 4.8–5.6)
Mean Plasma Glucose: 283.35 mg/dL

## 2018-09-24 LAB — PROTIME-INR
INR: 1 (ref 0.8–1.2)
Prothrombin Time: 13.1 seconds (ref 11.4–15.2)

## 2018-09-24 LAB — GLUCOSE, CAPILLARY
Glucose-Capillary: 513 mg/dL (ref 70–99)
Glucose-Capillary: 548 mg/dL (ref 70–99)

## 2018-09-24 LAB — SURGICAL PCR SCREEN
MRSA, PCR: NEGATIVE
Staphylococcus aureus: POSITIVE — AB

## 2018-09-24 LAB — APTT: aPTT: 24 seconds (ref 24–36)

## 2018-09-24 NOTE — Telephone Encounter (Signed)
I called Mrs Alexandria Webster about her elevated blood sugars. We discussed the importance of taking her medications as directed and eating a proper diabetic diet. According to Karoline Caldwell, PA her CBG was 548 this morning and her A1C was 11.5. I told her that blood sugars this high are detrimental to her health and could contribute to a poor outcome for her redo surgery on Thursday. Patient said that she just took her medication and will drink a lot of water this week. She voiced understanding of our concern and will also watch what she eats.

## 2018-09-24 NOTE — Anesthesia Preprocedure Evaluation (Addendum)
Anesthesia Evaluation  Patient identified by MRN, date of birth, ID band Patient awake    Reviewed: Allergy & Precautions, H&P , NPO status , Patient's Chart, lab work & pertinent test results  History of Anesthesia Complications (+) PONV  Airway Mallampati: II  TM Distance: >3 FB Neck ROM: Full    Dental no notable dental hx. (+) Edentulous Upper, Edentulous Lower, Dental Advisory Given   Pulmonary asthma , COPD,  COPD inhaler, former smoker,    Pulmonary exam normal breath sounds clear to auscultation       Cardiovascular Exercise Tolerance: Good hypertension, Pt. on medications + Peripheral Vascular Disease   Rhythm:Regular Rate:Normal     Neuro/Psych negative neurological ROS  negative psych ROS   GI/Hepatic Neg liver ROS, GERD  Medicated and Controlled,  Endo/Other  diabetes, Poorly Controlled, Type 1, Insulin DependentHypothyroidism   Renal/GU negative Renal ROS  negative genitourinary   Musculoskeletal  (+) Arthritis , Osteoarthritis,    Abdominal   Peds  Hematology negative hematology ROS (+)   Anesthesia Other Findings   Reproductive/Obstetrics negative OB ROS                           Anesthesia Physical Anesthesia Plan  ASA: III  Anesthesia Plan: General   Post-op Pain Management:    Induction: Intravenous  PONV Risk Score and Plan: 4 or greater and Ondansetron, Midazolam and Scopolamine patch - Pre-op  Airway Management Planned: Oral ETT  Additional Equipment: Arterial line, CVP and Ultrasound Guidance Line Placement  Intra-op Plan: Utilization of Controlled Hypotension per surrgeon request  Post-operative Plan: Extubation in OR  Informed Consent: I have reviewed the patients History and Physical, chart, labs and discussed the procedure including the risks, benefits and alternatives for the proposed anesthesia with the patient or authorized representative who has  indicated his/her understanding and acceptance.     Dental advisory given  Plan Discussed with: CRNA  Anesthesia Plan Comments: (BG 550 at PAT. A1c 11.5. Pt was asymptomatic. She admitted to eating fast food and not taking insulin prior to appointment. No other significant lab abnormalities noted. She was advised to return home and take insulin as directed, recheck BG, and call PCP. Seek care at ED if no improvement. Dr. Ainsley Spinner office made aware of pt's poor BG control. Diabetes coordinator consulted for DOS.)      Anesthesia Quick Evaluation

## 2018-09-24 NOTE — Progress Notes (Signed)
Pt denies SOB, chest pain, and being under the care of a cardiologist. Pt stated that a stress test and cardiac cath were both performed > 10 years ago. Pt denies having an endocrinologist. Pt stated " I see Dr. Jani Gravel at [facility] for everything."  Pt denies having an EKG within the last year. Pt denies recent labs. Pt stated that she did not take her diabetes medications this morning prior to her PAT appointment. Pt stated that she had an Egg McMuffin and a Diet Mountain Dew at 7:30 A.M.. Pt instructed to take take her diabetes medications and drink plenty of water as soon as she gets home.  Pt denies that she and family members tested positive for COVID-19 ( pt stated that she is scheduled to be tested on 09/24/18 and pt reminded to quarantine).   Coronavirus Screening  Pt denies that she and family memeber experienced the following symptoms:  Cough yes/no: No Fever (>100.94F)  yes/no: No Runny nose yes/no: No Sore throat yes/no: No Difficulty breathing/shortness of breath  yes/no: No  Have you or a family member traveled in the last 14 days and where? yes/no: No  Pt reminded that hospital visitation restrictions are in effect and the importance of the restrictions.   Pt verbalized understanding of all pre-op instructions.  PA, Anesthesiology made aware of pt elevated blood glucose and asked to review pt history; see note.

## 2018-09-25 LAB — SARS CORONAVIRUS 2 (TAT 6-24 HRS): SARS Coronavirus 2: NEGATIVE

## 2018-09-27 ENCOUNTER — Inpatient Hospital Stay (HOSPITAL_COMMUNITY): Payer: BC Managed Care – PPO | Admitting: Physician Assistant

## 2018-09-27 ENCOUNTER — Encounter (HOSPITAL_COMMUNITY): Payer: Self-pay | Admitting: Anesthesiology

## 2018-09-27 ENCOUNTER — Other Ambulatory Visit: Payer: Self-pay

## 2018-09-27 ENCOUNTER — Encounter (HOSPITAL_COMMUNITY)
Admission: RE | Disposition: A | Discharge status: Home Health | Payer: Self-pay | Source: Home / Self Care | Attending: Vascular Surgery

## 2018-09-27 ENCOUNTER — Inpatient Hospital Stay (HOSPITAL_COMMUNITY)
Admission: RE | Admit: 2018-09-27 | Discharge: 2018-10-04 | DRG: 271 | Disposition: A | Discharge status: Home Health | Payer: BC Managed Care – PPO | Attending: Vascular Surgery | Admitting: Vascular Surgery

## 2018-09-27 ENCOUNTER — Inpatient Hospital Stay (HOSPITAL_COMMUNITY): Payer: BC Managed Care – PPO

## 2018-09-27 DIAGNOSIS — D62 Acute posthemorrhagic anemia: Secondary | ICD-10-CM | POA: Diagnosis not present

## 2018-09-27 DIAGNOSIS — Z79899 Other long term (current) drug therapy: Secondary | ICD-10-CM

## 2018-09-27 DIAGNOSIS — I959 Hypotension, unspecified: Secondary | ICD-10-CM | POA: Diagnosis present

## 2018-09-27 DIAGNOSIS — Z7982 Long term (current) use of aspirin: Secondary | ICD-10-CM | POA: Diagnosis not present

## 2018-09-27 DIAGNOSIS — J449 Chronic obstructive pulmonary disease, unspecified: Secondary | ICD-10-CM | POA: Diagnosis present

## 2018-09-27 DIAGNOSIS — Z833 Family history of diabetes mellitus: Secondary | ICD-10-CM

## 2018-09-27 DIAGNOSIS — Z955 Presence of coronary angioplasty implant and graft: Secondary | ICD-10-CM

## 2018-09-27 DIAGNOSIS — M5136 Other intervertebral disc degeneration, lumbar region: Secondary | ICD-10-CM | POA: Diagnosis present

## 2018-09-27 DIAGNOSIS — E039 Hypothyroidism, unspecified: Secondary | ICD-10-CM | POA: Diagnosis present

## 2018-09-27 DIAGNOSIS — E1143 Type 2 diabetes mellitus with diabetic autonomic (poly)neuropathy: Secondary | ICD-10-CM | POA: Diagnosis present

## 2018-09-27 DIAGNOSIS — Z794 Long term (current) use of insulin: Secondary | ICD-10-CM | POA: Diagnosis not present

## 2018-09-27 DIAGNOSIS — E11649 Type 2 diabetes mellitus with hypoglycemia without coma: Secondary | ICD-10-CM | POA: Diagnosis present

## 2018-09-27 DIAGNOSIS — Z87891 Personal history of nicotine dependence: Secondary | ICD-10-CM | POA: Diagnosis not present

## 2018-09-27 DIAGNOSIS — I7409 Other arterial embolism and thrombosis of abdominal aorta: Secondary | ICD-10-CM | POA: Diagnosis present

## 2018-09-27 DIAGNOSIS — Z95828 Presence of other vascular implants and grafts: Secondary | ICD-10-CM

## 2018-09-27 DIAGNOSIS — I700 Atherosclerosis of aorta: Secondary | ICD-10-CM | POA: Diagnosis present

## 2018-09-27 DIAGNOSIS — E7800 Pure hypercholesterolemia, unspecified: Secondary | ICD-10-CM | POA: Diagnosis present

## 2018-09-27 DIAGNOSIS — E1151 Type 2 diabetes mellitus with diabetic peripheral angiopathy without gangrene: Secondary | ICD-10-CM | POA: Diagnosis present

## 2018-09-27 DIAGNOSIS — Z8249 Family history of ischemic heart disease and other diseases of the circulatory system: Secondary | ICD-10-CM

## 2018-09-27 DIAGNOSIS — K3184 Gastroparesis: Secondary | ICD-10-CM | POA: Diagnosis present

## 2018-09-27 DIAGNOSIS — I70213 Atherosclerosis of native arteries of extremities with intermittent claudication, bilateral legs: Secondary | ICD-10-CM | POA: Diagnosis not present

## 2018-09-27 DIAGNOSIS — I10 Essential (primary) hypertension: Secondary | ICD-10-CM | POA: Diagnosis present

## 2018-09-27 DIAGNOSIS — K219 Gastro-esophageal reflux disease without esophagitis: Secondary | ICD-10-CM | POA: Diagnosis present

## 2018-09-27 HISTORY — PX: AORTA - BILATERAL FEMORAL ARTERY BYPASS GRAFT: SHX1175

## 2018-09-27 LAB — POCT I-STAT 4, (NA,K, GLUC, HGB,HCT)
Glucose, Bld: 86 mg/dL (ref 70–99)
Glucose, Bld: 133 mg/dL — ABNORMAL HIGH (ref 70–99)
HCT: 25 % — ABNORMAL LOW (ref 36.0–46.0)
HCT: 30 % — ABNORMAL LOW (ref 36.0–46.0)
Hemoglobin: 8.5 g/dL — ABNORMAL LOW (ref 12.0–15.0)
Hemoglobin: 10.2 g/dL — ABNORMAL LOW (ref 12.0–15.0)
Potassium: 3.1 mmol/L — ABNORMAL LOW (ref 3.5–5.1)
Potassium: 3.2 mmol/L — ABNORMAL LOW (ref 3.5–5.1)
Sodium: 140 mmol/L (ref 135–145)
Sodium: 143 mmol/L (ref 135–145)

## 2018-09-27 LAB — POCT I-STAT 7, (LYTES, BLD GAS, ICA,H+H)
Acid-base deficit: 1 mmol/L (ref 0.0–2.0)
Acid-base deficit: 3 mmol/L — ABNORMAL HIGH (ref 0.0–2.0)
Acid-base deficit: 2 mmol/L (ref 0.0–2.0)
Bicarbonate: 23.6 mmol/L (ref 20.0–28.0)
Bicarbonate: 23 mmol/L (ref 20.0–28.0)
Bicarbonate: 22.5 mmol/L (ref 20.0–28.0)
Calcium, Ion: 1.29 mmol/L (ref 1.15–1.40)
Calcium, Ion: 1.21 mmol/L (ref 1.15–1.40)
Calcium, Ion: 1.17 mmol/L (ref 1.15–1.40)
HCT: 31 % — ABNORMAL LOW (ref 36.0–46.0)
HCT: 27 % — ABNORMAL LOW (ref 36.0–46.0)
HCT: 27 % — ABNORMAL LOW (ref 36.0–46.0)
Hemoglobin: 10.5 g/dL — ABNORMAL LOW (ref 12.0–15.0)
Hemoglobin: 9.2 g/dL — ABNORMAL LOW (ref 12.0–15.0)
Hemoglobin: 9.2 g/dL — ABNORMAL LOW (ref 12.0–15.0)
O2 Saturation: 100 %
O2 Saturation: 100 %
O2 Saturation: 100 %
Patient temperature: 35
Patient temperature: 35.8
Patient temperature: 36
Potassium: 3.4 mmol/L — ABNORMAL LOW (ref 3.5–5.1)
Potassium: 3.7 mmol/L (ref 3.5–5.1)
Potassium: 3.6 mmol/L (ref 3.5–5.1)
Sodium: 142 mmol/L (ref 135–145)
Sodium: 141 mmol/L (ref 135–145)
Sodium: 142 mmol/L (ref 135–145)
TCO2: 25 mmol/L (ref 22–32)
TCO2: 24 mmol/L (ref 22–32)
TCO2: 24 mmol/L (ref 22–32)
pCO2 arterial: 33.5 mmHg (ref 32.0–48.0)
pCO2 arterial: 39.6 mmHg (ref 32.0–48.0)
pCO2 arterial: 36.1 mmHg (ref 32.0–48.0)
pH, Arterial: 7.448 (ref 7.350–7.450)
pH, Arterial: 7.366 (ref 7.350–7.450)
pH, Arterial: 7.399 (ref 7.350–7.450)
pO2, Arterial: 306 mmHg — ABNORMAL HIGH (ref 83.0–108.0)
pO2, Arterial: 328 mmHg — ABNORMAL HIGH (ref 83.0–108.0)
pO2, Arterial: 320 mmHg — ABNORMAL HIGH (ref 83.0–108.0)

## 2018-09-27 LAB — BLOOD GAS, ARTERIAL
Acid-base deficit: 3.6 mmol/L — ABNORMAL HIGH (ref 0.0–2.0)
Bicarbonate: 21.1 mmol/L (ref 20.0–28.0)
Drawn by: 35493
O2 Saturation: 99.1 %
Patient temperature: 98.6
pCO2 arterial: 39.8 mmHg (ref 32.0–48.0)
pH, Arterial: 7.345 — ABNORMAL LOW (ref 7.350–7.450)
pO2, Arterial: 224 mmHg — ABNORMAL HIGH (ref 83.0–108.0)

## 2018-09-27 LAB — BASIC METABOLIC PANEL
Anion gap: 7 (ref 5–15)
BUN: 13 mg/dL (ref 6–20)
CO2: 20 mmol/L — ABNORMAL LOW (ref 22–32)
Calcium: 7.5 mg/dL — ABNORMAL LOW (ref 8.9–10.3)
Chloride: 115 mmol/L — ABNORMAL HIGH (ref 98–111)
Creatinine, Ser: 0.65 mg/dL (ref 0.44–1.00)
GFR calc Af Amer: >60 mL/min (ref >60)
GFR calc non Af Amer: >60 mL/min (ref >60)
Glucose, Bld: 193 mg/dL — ABNORMAL HIGH (ref 70–99)
Potassium: 3.1 mmol/L — ABNORMAL LOW (ref 3.5–5.1)
Sodium: 142 mmol/L (ref 135–145)

## 2018-09-27 LAB — POCT ACTIVATED CLOTTING TIME
Activated Clotting Time: 186 seconds
Activated Clotting Time: 224 seconds
Activated Clotting Time: 246 seconds
Activated Clotting Time: 290 seconds

## 2018-09-27 LAB — HEMOGLOBIN AND HEMATOCRIT, BLOOD
HCT: 36.6 % (ref 36.0–46.0)
Hemoglobin: 12.7 g/dL (ref 12.0–15.0)

## 2018-09-27 LAB — CBC
HCT: 31.6 % — ABNORMAL LOW (ref 36.0–46.0)
Hemoglobin: 11 g/dL — ABNORMAL LOW (ref 12.0–15.0)
MCH: 33.2 pg (ref 26.0–34.0)
MCHC: 34.8 g/dL (ref 30.0–36.0)
MCV: 95.5 fL (ref 80.0–100.0)
Platelets: 146 10*3/uL — ABNORMAL LOW (ref 150–400)
RBC: 3.31 MIL/uL — ABNORMAL LOW (ref 3.87–5.11)
RDW: 12.5 % (ref 11.5–15.5)
WBC: 13 10*3/uL — ABNORMAL HIGH (ref 4.0–10.5)
nRBC: 0 % (ref 0.0–0.2)

## 2018-09-27 LAB — GLUCOSE, CAPILLARY
Glucose-Capillary: 130 mg/dL — ABNORMAL HIGH (ref 70–99)
Glucose-Capillary: 88 mg/dL (ref 70–99)
Glucose-Capillary: 113 mg/dL — ABNORMAL HIGH (ref 70–99)
Glucose-Capillary: 113 mg/dL — ABNORMAL HIGH (ref 70–99)
Glucose-Capillary: 95 mg/dL (ref 70–99)
Glucose-Capillary: 144 mg/dL — ABNORMAL HIGH (ref 70–99)
Glucose-Capillary: 197 mg/dL — ABNORMAL HIGH (ref 70–99)
Glucose-Capillary: 293 mg/dL — ABNORMAL HIGH (ref 70–99)

## 2018-09-27 LAB — MAGNESIUM: Magnesium: 1.2 mg/dL — ABNORMAL LOW (ref 1.7–2.4)

## 2018-09-27 LAB — PREPARE RBC (CROSSMATCH)

## 2018-09-27 LAB — PROTIME-INR
INR: 1.5 — ABNORMAL HIGH (ref 0.8–1.2)
Prothrombin Time: 17.9 seconds — ABNORMAL HIGH (ref 11.4–15.2)

## 2018-09-27 LAB — APTT: aPTT: 40 seconds — ABNORMAL HIGH (ref 24–36)

## 2018-09-27 SURGERY — CREATION, BYPASS, ARTERIAL, AORTA TO FEMORAL, BILATERAL, USING GRAFT
Anesthesia: General | Site: Abdomen

## 2018-09-27 MED ORDER — INSULIN REGULAR(HUMAN) IN NACL 100-0.9 UT/100ML-% IV SOLN
INTRAVENOUS | Status: DC
  Filled 2018-09-27: qty 100

## 2018-09-27 MED ORDER — SODIUM CHLORIDE 0.9% IV SOLUTION
Freq: Once | INTRAVENOUS | Status: AC
  Administered 2018-09-27: 16:00:00 via INTRAVENOUS

## 2018-09-27 MED ORDER — FENTANYL CITRATE (PF) 250 MCG/5ML IJ SOLN
INTRAMUSCULAR | Status: AC
  Filled 2018-09-27: qty 10

## 2018-09-27 MED ORDER — PROTAMINE SULFATE 10 MG/ML IV SOLN
INTRAVENOUS | Status: DC | PRN
  Administered 2018-09-27 (×5): 10 mg via INTRAVENOUS

## 2018-09-27 MED ORDER — SODIUM CHLORIDE 0.9 % IV SOLN
INTRAVENOUS | Status: DC
  Administered 2018-09-27 – 2018-10-03 (×11): via INTRAVENOUS

## 2018-09-27 MED ORDER — MANNITOL 25 % IV SOLN
INTRAVENOUS | Status: DC | PRN
  Administered 2018-09-27: 25 g via INTRAVENOUS

## 2018-09-27 MED ORDER — CEFAZOLIN SODIUM-DEXTROSE 2-4 GM/100ML-% IV SOLN
2.0000 g | Freq: Three times a day (TID) | INTRAVENOUS | Status: AC
  Administered 2018-09-27 – 2018-09-28 (×2): 2 g via INTRAVENOUS
  Filled 2018-09-27 (×2): qty 100

## 2018-09-27 MED ORDER — CHLORHEXIDINE GLUCONATE CLOTH 2 % EX PADS
6.0000 | MEDICATED_PAD | Freq: Every day | CUTANEOUS | Status: DC
  Administered 2018-09-28 – 2018-09-29 (×2): 6 via TOPICAL

## 2018-09-27 MED ORDER — ETOMIDATE 2 MG/ML IV SOLN
INTRAVENOUS | Status: AC
  Filled 2018-09-27: qty 10

## 2018-09-27 MED ORDER — SUCCINYLCHOLINE CHLORIDE 200 MG/10ML IV SOSY
PREFILLED_SYRINGE | INTRAVENOUS | Status: AC
  Filled 2018-09-27: qty 10

## 2018-09-27 MED ORDER — PROPOFOL 10 MG/ML IV BOLUS
INTRAVENOUS | Status: DC | PRN
  Administered 2018-09-27: 125 mg via INTRAVENOUS

## 2018-09-27 MED ORDER — POTASSIUM CHLORIDE CRYS ER 20 MEQ PO TBCR
20.0000 meq | EXTENDED_RELEASE_TABLET | Freq: Once | ORAL | Status: AC | PRN
  Administered 2018-10-04: 40 meq via ORAL
  Filled 2018-09-27: qty 2

## 2018-09-27 MED ORDER — SODIUM CHLORIDE 0.9 % IV SOLN
INTRAVENOUS | Status: DC | PRN
  Administered 2018-09-27: 08:00:00 .6 [IU]/h via INTRAVENOUS

## 2018-09-27 MED ORDER — DOCUSATE SODIUM 100 MG PO CAPS
100.0000 mg | ORAL_CAPSULE | Freq: Every day | ORAL | Status: DC
  Administered 2018-09-29 – 2018-10-04 (×6): 100 mg via ORAL
  Filled 2018-09-27 (×6): qty 1

## 2018-09-27 MED ORDER — HYDRALAZINE HCL 20 MG/ML IJ SOLN: 5.0000 mg | INTRAMUSCULAR | Status: DC | PRN

## 2018-09-27 MED ORDER — PANTOPRAZOLE SODIUM 40 MG IV SOLR
40.0000 mg | Freq: Every day | INTRAVENOUS | Status: DC
  Administered 2018-09-27 – 2018-10-01 (×5): 40 mg via INTRAVENOUS
  Filled 2018-09-27 (×5): qty 40

## 2018-09-27 MED ORDER — PROPOFOL 10 MG/ML IV BOLUS
INTRAVENOUS | Status: AC
  Filled 2018-09-27: qty 20

## 2018-09-27 MED ORDER — PHENYLEPHRINE HCL (PRESSORS) 10 MG/ML IV SOLN
INTRAVENOUS | Status: AC
  Filled 2018-09-27: qty 1

## 2018-09-27 MED ORDER — BISACODYL 10 MG RE SUPP: 10.0000 mg | Freq: Every day | RECTAL | Status: DC | PRN

## 2018-09-27 MED ORDER — PHENYLEPHRINE HCL-NACL 10-0.9 MG/250ML-% IV SOLN
0.0000 ug/min | INTRAVENOUS | Status: DC
  Administered 2018-09-27: 20 ug/min via INTRAVENOUS
  Administered 2018-09-27: 25 ug/min via INTRAVENOUS
  Administered 2018-09-27: 10 ug/min via INTRAVENOUS
  Administered 2018-09-28: 20 ug/min via INTRAVENOUS
  Administered 2018-09-28: 30 ug/min via INTRAVENOUS
  Filled 2018-09-27 (×4): qty 250

## 2018-09-27 MED ORDER — METOPROLOL TARTRATE 5 MG/5ML IV SOLN
2.5000 mg | Freq: Four times a day (QID) | INTRAVENOUS | Status: DC
  Administered 2018-09-30 – 2018-10-01 (×5): 2.5 mg via INTRAVENOUS
  Filled 2018-09-27 (×6): qty 5

## 2018-09-27 MED ORDER — CLEVIDIPINE BUTYRATE 0.5 MG/ML IV EMUL
1.0000 mg/h | INTRAVENOUS | Status: DC
  Filled 2018-09-27: qty 50

## 2018-09-27 MED ORDER — SODIUM CHLORIDE 0.9 % IV SOLN
INTRAVENOUS | Status: DC | PRN
  Administered 2018-09-27 (×2): via INTRAVENOUS

## 2018-09-27 MED ORDER — PHENYLEPHRINE HCL (PRESSORS) 10 MG/ML IV SOLN: INTRAVENOUS | Status: DC | PRN

## 2018-09-27 MED ORDER — LACTATED RINGERS IV SOLN
INTRAVENOUS | Status: DC | PRN
  Administered 2018-09-27 (×2): via INTRAVENOUS

## 2018-09-27 MED ORDER — METOPROLOL TARTRATE 5 MG/5ML IV SOLN: 2.0000 mg | INTRAVENOUS | Status: DC | PRN

## 2018-09-27 MED ORDER — ORAL CARE MOUTH RINSE
15.0000 mL | Freq: Two times a day (BID) | OROMUCOSAL | Status: DC
  Administered 2018-09-27 – 2018-10-03 (×8): 15 mL via OROMUCOSAL

## 2018-09-27 MED ORDER — SUGAMMADEX SODIUM 200 MG/2ML IV SOLN
INTRAVENOUS | Status: DC | PRN
  Administered 2018-09-27: 300 mg via INTRAVENOUS

## 2018-09-27 MED ORDER — EPHEDRINE SULFATE 50 MG/ML IJ SOLN
INTRAMUSCULAR | Status: DC | PRN
  Administered 2018-09-27: 5 mg via INTRAVENOUS

## 2018-09-27 MED ORDER — EPHEDRINE 5 MG/ML INJ
INTRAVENOUS | Status: AC
  Filled 2018-09-27: qty 10

## 2018-09-27 MED ORDER — LACTATED RINGERS IV SOLN
INTRAVENOUS | Status: DC | PRN
  Administered 2018-09-27 (×2): via INTRAVENOUS

## 2018-09-27 MED ORDER — MIDAZOLAM HCL 2 MG/2ML IJ SOLN
INTRAMUSCULAR | Status: AC
  Filled 2018-09-27: qty 2

## 2018-09-27 MED ORDER — CEFAZOLIN SODIUM-DEXTROSE 2-4 GM/100ML-% IV SOLN
2.0000 g | INTRAVENOUS | Status: AC
  Administered 2018-09-27: 08:00:00 2 g via INTRAVENOUS
  Filled 2018-09-27: qty 100

## 2018-09-27 MED ORDER — SODIUM CHLORIDE 0.9 % IV SOLN: 10.0000 mL/h | Freq: Once | INTRAVENOUS | Status: DC

## 2018-09-27 MED ORDER — PANTOPRAZOLE SODIUM 40 MG PO TBEC
40.0000 mg | DELAYED_RELEASE_TABLET | Freq: Every day | ORAL | Status: DC
  Administered 2018-09-29 – 2018-10-04 (×6): 40 mg via ORAL
  Filled 2018-09-27 (×6): qty 1

## 2018-09-27 MED ORDER — 0.9 % SODIUM CHLORIDE (POUR BTL) OPTIME
TOPICAL | Status: DC | PRN
  Administered 2018-09-27: 1000 mL

## 2018-09-27 MED ORDER — PAPAVERINE HCL 30 MG/ML IJ SOLN
INTRAMUSCULAR | Status: AC
  Filled 2018-09-27: qty 2

## 2018-09-27 MED ORDER — HYDROMORPHONE HCL 1 MG/ML IJ SOLN
INTRAMUSCULAR | Status: AC
  Filled 2018-09-27: qty 1

## 2018-09-27 MED ORDER — PHENOL 1.4 % MT LIQD: 1.0000 | OROMUCOSAL | Status: DC | PRN

## 2018-09-27 MED ORDER — ONDANSETRON HCL 4 MG/2ML IJ SOLN: 4.0000 mg | Freq: Four times a day (QID) | INTRAMUSCULAR | Status: DC | PRN

## 2018-09-27 MED ORDER — SODIUM CHLORIDE 0.9 % IV SOLN
INTRAVENOUS | Status: AC
  Filled 2018-09-27: qty 1.2

## 2018-09-27 MED ORDER — PHENYLEPHRINE HCL (PRESSORS) 10 MG/ML IV SOLN
INTRAVENOUS | Status: DC | PRN
  Administered 2018-09-27: 40 ug via INTRAVENOUS
  Administered 2018-09-27: 80 ug via INTRAVENOUS
  Administered 2018-09-27 (×2): 120 ug via INTRAVENOUS

## 2018-09-27 MED ORDER — HEPARIN SODIUM (PORCINE) 1000 UNIT/ML IJ SOLN
INTRAMUSCULAR | Status: DC | PRN
  Administered 2018-09-27: 3000 [IU] via INTRAVENOUS
  Administered 2018-09-27 (×2): 2000 [IU] via INTRAVENOUS
  Administered 2018-09-27: 7000 [IU] via INTRAVENOUS

## 2018-09-27 MED ORDER — ACETAMINOPHEN 325 MG RE SUPP: 325.0000 mg | RECTAL | Status: DC | PRN

## 2018-09-27 MED ORDER — MIDAZOLAM HCL 5 MG/5ML IJ SOLN
INTRAMUSCULAR | Status: DC | PRN
  Administered 2018-09-27 (×2): 1 mg via INTRAVENOUS

## 2018-09-27 MED ORDER — CHLORHEXIDINE GLUCONATE CLOTH 2 % EX PADS: 6.0000 | MEDICATED_PAD | Freq: Once | CUTANEOUS | Status: DC

## 2018-09-27 MED ORDER — HEMOSTATIC AGENTS (NO CHARGE) OPTIME
TOPICAL | Status: DC | PRN
  Administered 2018-09-27: 2 via TOPICAL

## 2018-09-27 MED ORDER — SODIUM CHLORIDE 0.9 % IV SOLN
INTRAVENOUS | Status: DC | PRN
  Administered 2018-09-27: 500 mL

## 2018-09-27 MED ORDER — LIDOCAINE 2% (20 MG/ML) 5 ML SYRINGE
INTRAMUSCULAR | Status: DC | PRN
  Administered 2018-09-27: 40 mg via INTRAVENOUS

## 2018-09-27 MED ORDER — SODIUM CHLORIDE 0.9 % IV SOLN
INTRAVENOUS | Status: DC | PRN
  Administered 2018-09-27: 12:00:00 75 ug/min via INTRAVENOUS
  Administered 2018-09-27: 08:00:00 25 ug/min via INTRAVENOUS

## 2018-09-27 MED ORDER — VASOPRESSIN 20 UNIT/ML IV SOLN
INTRAVENOUS | Status: AC
  Filled 2018-09-27: qty 1

## 2018-09-27 MED ORDER — HYDROMORPHONE HCL 1 MG/ML IJ SOLN: 0.2500 mg | INTRAMUSCULAR | Status: DC | PRN

## 2018-09-27 MED ORDER — PROMETHAZINE HCL 25 MG/ML IJ SOLN
6.2500 mg | Freq: Once | INTRAMUSCULAR | Status: AC
  Administered 2018-09-27: 6.25 mg via INTRAVENOUS

## 2018-09-27 MED ORDER — HEPARIN SODIUM (PORCINE) 1000 UNIT/ML IJ SOLN
INTRAMUSCULAR | Status: AC
  Filled 2018-09-27: qty 2

## 2018-09-27 MED ORDER — ROCURONIUM BROMIDE 10 MG/ML (PF) SYRINGE
PREFILLED_SYRINGE | INTRAVENOUS | Status: AC
  Filled 2018-09-27: qty 10

## 2018-09-27 MED ORDER — THROMBIN (RECOMBINANT) 20000 UNITS EX SOLR
CUTANEOUS | Status: AC
  Filled 2018-09-27: qty 20000

## 2018-09-27 MED ORDER — INSULIN ASPART 100 UNIT/ML ~~LOC~~ SOLN
0.0000 [IU] | Freq: Three times a day (TID) | SUBCUTANEOUS | Status: DC
  Administered 2018-09-27: 3 [IU] via SUBCUTANEOUS
  Administered 2018-09-28: 8 [IU] via SUBCUTANEOUS

## 2018-09-27 MED ORDER — PHENYLEPHRINE 40 MCG/ML (10ML) SYRINGE FOR IV PUSH (FOR BLOOD PRESSURE SUPPORT)
PREFILLED_SYRINGE | INTRAVENOUS | Status: AC
  Filled 2018-09-27: qty 10

## 2018-09-27 MED ORDER — MANNITOL 25 % IV SOLN
25.0000 g | Freq: Once | Status: DC
  Filled 2018-09-27 (×2): qty 100

## 2018-09-27 MED ORDER — SODIUM BICARBONATE 8.4 % IV SOLN
INTRAVENOUS | Status: DC | PRN
  Administered 2018-09-27: 25 mL via INTRAVENOUS

## 2018-09-27 MED ORDER — FENTANYL CITRATE (PF) 100 MCG/2ML IJ SOLN
INTRAMUSCULAR | Status: DC | PRN
  Administered 2018-09-27: 50 ug via INTRAVENOUS
  Administered 2018-09-27: 200 ug via INTRAVENOUS
  Administered 2018-09-27 (×3): 50 ug via INTRAVENOUS

## 2018-09-27 MED ORDER — LACTATED RINGERS IV SOLN
INTRAVENOUS | Status: DC | PRN
  Administered 2018-09-27: 07:00:00 via INTRAVENOUS

## 2018-09-27 MED ORDER — OXYCODONE-ACETAMINOPHEN 5-325 MG PO TABS
1.0000 | ORAL_TABLET | ORAL | Status: DC | PRN
  Administered 2018-09-29: 1 via ORAL
  Administered 2018-09-29 – 2018-10-03 (×6): 2 via ORAL
  Filled 2018-09-27 (×7): qty 2

## 2018-09-27 MED ORDER — ONDANSETRON HCL 4 MG/2ML IJ SOLN
INTRAMUSCULAR | Status: DC | PRN
  Administered 2018-09-27: 4 mg via INTRAVENOUS

## 2018-09-27 MED ORDER — MAGNESIUM SULFATE 2 GM/50ML IV SOLN
2.0000 g | Freq: Once | INTRAVENOUS | Status: AC | PRN
  Administered 2018-09-28: 2 g via INTRAVENOUS
  Filled 2018-09-27: qty 50

## 2018-09-27 MED ORDER — ALBUMIN HUMAN 5 % IV SOLN
INTRAVENOUS | Status: DC | PRN
  Administered 2018-09-27 (×3): via INTRAVENOUS

## 2018-09-27 MED ORDER — SODIUM CHLORIDE 0.9 % IV SOLN: INTRAVENOUS | Status: DC

## 2018-09-27 MED ORDER — DEXAMETHASONE SODIUM PHOSPHATE 10 MG/ML IJ SOLN
INTRAMUSCULAR | Status: DC | PRN
  Administered 2018-09-27: 10 mg via INTRAVENOUS

## 2018-09-27 MED ORDER — PROMETHAZINE HCL 25 MG/ML IJ SOLN
INTRAMUSCULAR | Status: AC
  Filled 2018-09-27: qty 1

## 2018-09-27 MED ORDER — LABETALOL HCL 5 MG/ML IV SOLN: 10.0000 mg | INTRAVENOUS | Status: DC | PRN

## 2018-09-27 MED ORDER — GUAIFENESIN-DM 100-10 MG/5ML PO SYRP: 15.0000 mL | ORAL_SOLUTION | ORAL | Status: DC | PRN

## 2018-09-27 MED ORDER — ACETAMINOPHEN 325 MG PO TABS: 325.0000 mg | ORAL_TABLET | ORAL | Status: DC | PRN

## 2018-09-27 MED ORDER — LIDOCAINE 2% (20 MG/ML) 5 ML SYRINGE
INTRAMUSCULAR | Status: AC
  Filled 2018-09-27: qty 5

## 2018-09-27 MED ORDER — ACETAMINOPHEN 500 MG PO TABS
1000.0000 mg | ORAL_TABLET | Freq: Once | ORAL | Status: AC
  Administered 2018-09-27: 1000 mg via ORAL
  Filled 2018-09-27: qty 2

## 2018-09-27 MED ORDER — DEXTROSE-NACL 5-0.45 % IV SOLN
INTRAVENOUS | Status: DC
  Administered 2018-09-27 (×2): via INTRAVENOUS

## 2018-09-27 MED ORDER — SODIUM CHLORIDE 0.9 % IV SOLN
500.0000 mL | Freq: Once | INTRAVENOUS | Status: AC | PRN
  Administered 2018-09-27: 500 mL via INTRAVENOUS

## 2018-09-27 MED ORDER — MORPHINE SULFATE (PF) 2 MG/ML IV SOLN
2.0000 mg | INTRAVENOUS | Status: DC | PRN
  Administered 2018-09-27: 5 mg via INTRAVENOUS
  Administered 2018-09-28 (×2): 2 mg via INTRAVENOUS
  Administered 2018-09-28: 4 mg via INTRAVENOUS
  Administered 2018-09-28 – 2018-10-03 (×7): 2 mg via INTRAVENOUS
  Filled 2018-09-27: qty 1
  Filled 2018-09-27: qty 2
  Filled 2018-09-27 (×7): qty 1
  Filled 2018-09-27: qty 3
  Filled 2018-09-27: qty 2

## 2018-09-27 MED ORDER — ROCURONIUM BROMIDE 50 MG/5ML IV SOSY
PREFILLED_SYRINGE | INTRAVENOUS | Status: DC | PRN
  Administered 2018-09-27: 40 mg via INTRAVENOUS
  Administered 2018-09-27: 60 mg via INTRAVENOUS
  Administered 2018-09-27: 20 mg via INTRAVENOUS

## 2018-09-27 MED ORDER — SODIUM CHLORIDE 0.9% FLUSH: 10.0000 mL | INTRAVENOUS | Status: DC | PRN

## 2018-09-27 MED ORDER — MOMETASONE FURO-FORMOTEROL FUM 200-5 MCG/ACT IN AERO
2.0000 | INHALATION_SPRAY | Freq: Two times a day (BID) | RESPIRATORY_TRACT | Status: DC
  Administered 2018-09-27 – 2018-10-04 (×14): 2 via RESPIRATORY_TRACT
  Filled 2018-09-27: qty 8.8

## 2018-09-27 MED ORDER — SODIUM CHLORIDE 0.9% FLUSH
10.0000 mL | Freq: Two times a day (BID) | INTRAVENOUS | Status: DC
  Administered 2018-09-27 – 2018-09-28 (×4): 10 mL
  Administered 2018-09-29: 20 mL

## 2018-09-27 SURGICAL SUPPLY — 75 items
ADH SKN CLS APL DERMABOND .7 (GAUZE/BANDAGES/DRESSINGS) ×3
BAG ISL DRAPE 18X18 STRL (DRAPES) ×2
BAG ISOLATION DRAPE 18X18 (DRAPES) IMPLANT
CANISTER SUCT 3000ML PPV (MISCELLANEOUS) ×3 IMPLANT
CLIP VESOCCLUDE MED 24/CT (CLIP) ×5 IMPLANT
CLIP VESOCCLUDE SM WIDE 24/CT (CLIP) ×5 IMPLANT
COVER SURGICAL LIGHT HANDLE (MISCELLANEOUS) ×2 IMPLANT
COVER WAND RF STERILE (DRAPES) ×3 IMPLANT
DERMABOND ADVANCED (GAUZE/BANDAGES/DRESSINGS) ×6
DERMABOND ADVANCED .7 DNX12 (GAUZE/BANDAGES/DRESSINGS) IMPLANT
DRAPE ISOLATION BAG 18X18 (DRAPES) ×4
ELECT BLADE 4.0 EZ CLEAN MEGAD (MISCELLANEOUS)
ELECT BLADE 6.5 EXT (BLADE) IMPLANT
ELECT CAUTERY BLADE 6.4 (BLADE) ×2 IMPLANT
ELECT REM PT RETURN 9FT ADLT (ELECTROSURGICAL) ×3
ELECTRODE BLDE 4.0 EZ CLN MEGD (MISCELLANEOUS) IMPLANT
ELECTRODE REM PT RTRN 9FT ADLT (ELECTROSURGICAL) ×1 IMPLANT
GLOVE BIO SURGEON STRL SZ 6.5 (GLOVE) ×6 IMPLANT
GLOVE BIO SURGEON STRL SZ7.5 (GLOVE) ×11 IMPLANT
GLOVE BIO SURGEONS STRL SZ 6.5 (GLOVE) ×6
GLOVE BIOGEL PI IND STRL 6.5 (GLOVE) IMPLANT
GLOVE BIOGEL PI IND STRL 7.0 (GLOVE) IMPLANT
GLOVE BIOGEL PI IND STRL 8 (GLOVE) ×1 IMPLANT
GLOVE BIOGEL PI INDICATOR 6.5 (GLOVE) ×10
GLOVE BIOGEL PI INDICATOR 7.0 (GLOVE) ×2
GLOVE BIOGEL PI INDICATOR 8 (GLOVE) ×8
GLOVE ECLIPSE 6.5 STRL STRAW (GLOVE) ×4 IMPLANT
GLOVE ECLIPSE 8.0 STRL XLNG CF (GLOVE) ×8 IMPLANT
GOWN STRL REUS W/ TWL LRG LVL3 (GOWN DISPOSABLE) ×3 IMPLANT
GOWN STRL REUS W/ TWL XL LVL3 (GOWN DISPOSABLE) ×1 IMPLANT
GOWN STRL REUS W/TWL 2XL LVL3 (GOWN DISPOSABLE) ×4 IMPLANT
GOWN STRL REUS W/TWL LRG LVL3 (GOWN DISPOSABLE) ×12
GOWN STRL REUS W/TWL XL LVL3 (GOWN DISPOSABLE) ×9
GRAFT HEMASHIELD 14X7MM (Vascular Products) ×2 IMPLANT
INSERT FOGARTY 61MM (MISCELLANEOUS) ×7 IMPLANT
INSERT FOGARTY SM (MISCELLANEOUS) ×10 IMPLANT
KIT BASIN OR (CUSTOM PROCEDURE TRAY) ×3 IMPLANT
KIT TURNOVER KIT B (KITS) ×3 IMPLANT
LOOP VESSEL MAXI BLUE (MISCELLANEOUS) ×4 IMPLANT
NS IRRIG 1000ML POUR BTL (IV SOLUTION) ×6 IMPLANT
PACK AORTA (CUSTOM PROCEDURE TRAY) ×3 IMPLANT
PAD ARMBOARD 7.5X6 YLW CONV (MISCELLANEOUS) ×6 IMPLANT
PENCIL BUTTON HOLSTER BLD 10FT (ELECTRODE) ×2 IMPLANT
RETAINER VISCERA MED (MISCELLANEOUS) ×3 IMPLANT
SPONGE LAP 18X18 X RAY DECT (DISPOSABLE) ×10 IMPLANT
STAPLER VISISTAT 35W (STAPLE) ×3 IMPLANT
SURGICEL SNOW 2X4 (HEMOSTASIS) ×4 IMPLANT
SUT ETHIBOND 5 LR DA (SUTURE) IMPLANT
SUT MNCRL AB 4-0 PS2 18 (SUTURE) ×10 IMPLANT
SUT PDS AB 1 TP1 96 (SUTURE) ×10 IMPLANT
SUT PROLENE 3 0 SH1 36 (SUTURE) ×7 IMPLANT
SUT PROLENE 4 0 RB 1 (SUTURE) ×6
SUT PROLENE 4-0 RB1 .5 CRCL 36 (SUTURE) IMPLANT
SUT PROLENE 5 0 C 1 24 (SUTURE) ×16 IMPLANT
SUT PROLENE 5 0 C 1 36 (SUTURE) IMPLANT
SUT PROLENE 5 0 CC 1 (SUTURE) ×2 IMPLANT
SUT PROLENE 6 0 CC (SUTURE) ×6 IMPLANT
SUT SILK 2 0 (SUTURE) ×6
SUT SILK 2 0 SH CR/8 (SUTURE) ×5 IMPLANT
SUT SILK 2 0 TIES 17X18 (SUTURE) ×3
SUT SILK 2-0 18XBRD TIE 12 (SUTURE) ×1 IMPLANT
SUT SILK 2-0 18XBRD TIE BLK (SUTURE) ×1 IMPLANT
SUT SILK 3 0 (SUTURE) ×6
SUT SILK 3 0 TIES 17X18 (SUTURE) ×3
SUT SILK 3-0 18XBRD TIE 12 (SUTURE) ×1 IMPLANT
SUT SILK 3-0 18XBRD TIE BLK (SUTURE) ×1 IMPLANT
SUT VIC AB 2-0 CT1 27 (SUTURE) ×12
SUT VIC AB 2-0 CT1 TAPERPNT 27 (SUTURE) ×2 IMPLANT
SUT VIC AB 3-0 SH 27 (SUTURE) ×21
SUT VIC AB 3-0 SH 27X BRD (SUTURE) ×2 IMPLANT
TOWEL GREEN STERILE (TOWEL DISPOSABLE) ×3 IMPLANT
TOWEL GREEN STERILE FF (TOWEL DISPOSABLE) ×2 IMPLANT
TOWEL SURG RFD BLUE STRL DISP (DISPOSABLE) ×8 IMPLANT
TRAY FOLEY MTR SLVR 16FR STAT (SET/KITS/TRAYS/PACK) ×3 IMPLANT
WATER STERILE IRR 1000ML POUR (IV SOLUTION) ×6 IMPLANT

## 2018-09-27 NOTE — Op Note (Signed)
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  Redo exposure of the right common femoral artery 2.  Redo exposure of the left common femoral artery 3.  Redo aortobifemoral bypass (14 mm x 7 mm bifurcated Dacron graft)  Surgeon: Dr. Marty Heck, MD  Co-surgeon: Dr. Servando Snare, MD  Assistant: Leontine Locket, PA  Indications: Patient is a 53 year old female who was recently seen in clinic for evaluation of worsening lifestyle limiting short distance claudication.  Ultimately it was determined based on a recent CT scan that she had evidence of an occluded aortobifemoral bypass that was performed 12 years ago by Dr. Kellie Simmering.  Patient stated her symptoms were so severe that she could not go on living life and ultimately elected to undergo redo aortobifemoral bypass after risks and benefits were discussed in detail including risk of duodenal or bowel injury, ureter injury, failure of wound to heal in the groin, risk of graft infection, and early graft failure.  Findings: Redo exposure of bilateral common femoral arteries using vertical groin incisions where the old limbs of her aortobifem were detached.  After extensive lysis of adhesions the old aortic graft was exposed where it was sewn infrarenal.  Ultimately the left renal vein was fully mobilized we got Vesseloops around the renal arteries bilaterally.  Initially a suprarenal clamp was placed for 11 minutes and the old graft was transected below the renals and subsequently thrombus was removed and the graft was flushed.  We then moved our clamp to infrarenal position and sewed a new 14 x 7 bifurcated Dacron graft end-to-end to the old graft infra-renal and tunneled under both ureters over the old graft limbs to the bilateral common femoral arteries.  At the completion of the case  she had bilateral posterior tibial signals.  Anesthesia: General  EBL: 800 mL  Suprarenal clamp time: 11 minutes  Details: The patient was taken to the operating room after informed consent was obtained.  She was placed on the operative table in supine position.  Her bilateral groins as well as her abdominal wall were prepped and draped in usual sterile fashion after general endotracheal anesthesia was induced.  A timeout was performed to identify patient, procedure, and site.  We started by performing redo exposure of the bilateral common femoral arteries.  I started in the right groin while my partner Dr. Donzetta Matters started in the left groin.  Vertical groin incisions were made at her previous incision site with scalpel.  On the right I carried the dissection down with Bovie cautery until isolated the right limb of her aortobifemoral bypass graft.  Ultimately using blunt dissection with Metzenbaum scissors and Bovie cautery isolated SFA and the profunda and to control these with large Vesseloops.  I then dissected under the inguinal ligament and was able to get a loop around the native external iliac artery.  Also created a tunnel over top of the previous right aortobifem limb with my finger using blunt dissection.  Please see Dr. Claretha Cooper dictation for the left groin.  We then performed a midline incision at her previous incision site from just below the sternum to the top of the pubis.  Bovie cautery was used to enter the peritoneal cavity.  There was extensive adhesions with omentum stuck to the abdominal wall.  Extensive lysis of adhesions was performed with Bovie cautery and blunt dissection we took  down all the adhesions from the duodenum and small bowel and omentum and freed up the transverse colon.  Ultimately an NG tube was placed in the stomach and palpated to ensure it was in the right place.  We then mobilized the transverse colon cephalad and the small bowel was eviscerated into the right upper  quadrant.  Ultimately the retroperitoneum was opened after identified the left renal vein and the duodenum was mobilized off the old aortic graft to the right.  The retroperitoneum was then opened completely staying on top of the old graft opening the retroperitoneum down the right limb of the old aortobifem graft.  We then put a fixed Omni retractor in place and placed a moist towel around the small bowel after it was eviscerated the right upper quadrant and a fence was used to keep this in place along with body wall retractors.  Ultimately with Bovie cautery we then fully mobilized the left renal vein and renal retractor was placed until we could identify both renal arteries.  Large Vesseloops were then placed around the right and left renal artery.  The right renal artery was extremely calcified.  Patient was then given 25 g of mannitol.  We then exposed the anterior and lateral walls of infra-renal graft just below old anastomosis.  We then tunneled bluntly in the retroperitoneum over top of the previous grafts on each side.  The ureter on both the left and the right had to be mobilized off the anterior graft wall of the old graft since it was extremely scarred down and stuck.  This was performed with Metzenbaum scissors very carefully.  We then passed umbilical tape under each tunnel and secured in place.  That point in time the patient was given 7000 units of IV heparin.  ACT was checked to ensure it was greater than 250.  Initially pulled up on the Vesseloops around each renal artery.  A suprarenal aortic clamp was placed.  That point in time we then opened the old infrarenal aortic graft below the previous anastomosis with 11 blade scalpel in transverse fashion.  Ultimately the graft was completely cut in half and then mobilized off the anterior wall of the native aorta where it was very stuck.  Once we had enough sewing ring we then used Russians along with Debakey forceps to clean out all the old thrombus  within the graft and native infrarenal aorta.  We came off the suprarenal clamp and flushed out all remaining thrombus until everything was clean.  Both renal arteries were backbled.  At that point in time brought 14 mm x 7 mm bifurcated Dacron graft on the field and the main body was trimmed proximally.  An end-to-end proximal anastomosis then performed with a running 3-0 Prolene in parachute format.  Should be noted that just prior to starting anastomosis we did move our aortic clamp to the infrarenal position and backbleed the renal arteries again.  Total suprarenal clamp time was 11 minutes.  Once the anastomosis was complete there was no leaks with good hemostasis.  Both limbs of the graft were flushed with excellent inflow.  I then tunneled each limb of the graft under the ureter on top of the old graft using the umbilical tape  Then starting in the right and then the left groin a end to side common femoral anastomosis was performed after getting control of the SFA profunda and external iliac with vessels loops.  The old distal limb of the graft was completely  transected with Metzenbaum scissors.  Again a end-to-side anastomosis was performed with 5-0 Prolene in parachute format when the graft was spatulated.  The SFA and profunda had good backbleeding bilaterally.  I then did the same step in the left groin sewing to the common femoral artery.  I used Doppler and checked the SFA and profunda which had excellent signals in both groins.  We went down checked the feet and the patient had posterior tibial signals.  At that point in time protamine was given for reversal.  Additional patch stitches had to be placed in the right common femoral anastomosis for hemostasis.  Once protamine was administered we washed out the retroperitoneum Surgicel snow was used for added hemostasis.  I looked at the anastomosis again appeared to be excellent hemostasis.  The retroperitoneum was then run closed over the new graft with  3-0 Vicryl in running format.  Very careful around the right ureter.  Once this was complete the peritoneal cavity was then irrigated with sterile saline.  The midline fascia was run closed with a #1 double-stranded PDS.  Subcutaneous tissue was closed with 3-0 Vicryl running format.  Skin was closed with 4-0 Monocryl and Dermabond.  I then closed each common femoral artery exposure after irrigating this with sterile saline and ensuring we had good hemostasis.  Two layers of 2-0 Vicryl and then a layer of 3-0 Vicryl finally 4-0 Monocryl in the skin were used for multilayer closure of each groin.  Dermabond was applied to all incisions.  We then checked her feet again at the end of the case that she still had posterior tibial signals.  She will be taken to PACU in stable condition.  All needle and sponge counts were correct.  Complication: None  Condition: Stable  Marty Heck, MD [facility] Office: [phone] Pager: Drain

## 2018-09-27 NOTE — Anesthesia Procedure Notes (Signed)
Procedure Name: Intubation Date/Time: 09/27/2018 7:44 AM Performed by: Neldon Newport, CRNA Pre-anesthesia Checklist: Patient identified, Emergency Drugs available, Suction available, Patient being monitored and Timeout performed Patient Re-evaluated:Patient Re-evaluated prior to induction Oxygen Delivery Method: Circle system utilized Preoxygenation: Pre-oxygenation with 100% oxygen Induction Type: IV induction Ventilation: Mask ventilation without difficulty and Oral airway inserted - appropriate to patient size Laryngoscope Size: Mac and 3 Grade View: Grade I Tube type: Oral Tube size: 7.0 mm Number of attempts: 1 Placement Confirmation: ETT inserted through vocal cords under direct vision,  CO2 detector and breath sounds checked- equal and bilateral Secured at: 21 cm Tube secured with: Tape Dental Injury: Teeth and Oropharynx as per pre-operative assessment

## 2018-09-27 NOTE — Anesthesia Postprocedure Evaluation (Signed)
Anesthesia Post Note  Patient: Alexandria Webster  Procedure(s) Performed: Redo Exposure  AORTOBIFEMORAL Bypass and bilateral femoral Artery ; Redo Aortobifemoral  BYPASS GRAFT. (N/A Abdomen)     Patient location during evaluation: PACU Anesthesia Type: General Level of consciousness: awake and alert Pain management: pain level controlled Vital Signs Assessment: post-procedure vital signs reviewed and stable Respiratory status: spontaneous breathing, nonlabored ventilation, respiratory function stable and patient connected to nasal cannula oxygen Cardiovascular status: blood pressure returned to baseline and stable Postop Assessment: no apparent nausea or vomiting Anesthetic complications: no    Last Vitals:  Vitals:   09/27/18 1400 09/27/18 1415  BP: (!) 102/56 (!) 120/52  Pulse: 71 71  Resp: 19 20  Temp:    SpO2: 100% 100%    Last Pain:  Vitals:   09/27/18 1400  TempSrc:   PainSc: Asleep                 Carlean Crowl,W. EDMOND

## 2018-09-27 NOTE — Transfer of Care (Signed)
Immediate Anesthesia Transfer of Care Note  Patient: Alexandria Webster  Procedure(s) Performed: Redo Exposure  AORTOBIFEMORAL Bypass and bilateral femoral Artery ; Redo Aortobifemoral  BYPASS GRAFT. (N/A Abdomen)  Patient Location: PACU  Anesthesia Type:General  Level of Consciousness: awake, alert  and oriented  Airway & Oxygen Therapy: Patient Spontanous Breathing and Patient connected to face mask oxygen  Post-op Assessment: Report given to RN, Post -op Vital signs reviewed and stable and Patient moving all extremities X 4  Post vital signs: Reviewed and stable  Last Vitals:  Vitals Value Taken Time  BP 184/86 09/27/18 1328  Temp    Pulse 77 09/27/18 1331  Resp 34 09/27/18 1332  SpO2 100 % 09/27/18 1331  Vitals shown include unvalidated device data.  Last Pain:  Vitals:   09/27/18 0624  TempSrc:   PainSc: 6       Patients Stated Pain Goal: 2 (97/18/20 9906)  Complications: No apparent anesthesia complications

## 2018-09-27 NOTE — H&P (Signed)
History and Physical Interval Note:  09/27/2018 7:28 AM  Alexandria Webster  has presented today for surgery, with the diagnosis of aortoiliac occlusive disease.  The various methods of treatment have been discussed with the patient and family. After consideration of risks, benefits and other options for treatment, the patient has consented to  Procedure(s): AORTOBIFEMORAL BYPASS GRAFT REDO (N/A) as a surgical intervention.  The patient's history has been reviewed, patient examined, no change in status, stable for surgery.  I have reviewed the patient's chart and labs.  Questions were answered to the patient's satisfaction.    Plan for redo aortobifemoral bypass.  Patient had aortobifem by Dr. Kellie Simmering 12 years ago that has since occluded.  She has severe short distance lifestyle limiting claudication and some early rest pain symptoms according to her most recent history today.  States she cannot go on living her life like this.  I discussed exceedingly high risk of this operation including bowel injury and/or duodenal injury, ureteral injury, risk of bleeding or graft infection, risk of wounds failing to heal in the groin.  Discussed if things get dangerous our backup plan to be ax bifem.  She understands even risk of death and wants to proceed.  Marty Heck  Patient name: Alexandria Webster      MRN: [id number]        DOB: 1965-11-05          Sex: female  REASON FOR CONSULT: Occluded aortobifemoral bypass  HPI: Alexandria Webster is a 53 y.o. female, with history of diabetes, asthma, hypertension that presents for evaluation of bilateral leg pain.  Patient states claudication-like symptoms started in both lower extremities about 5 months ago.  States she really cannot walk at all and cannot even make it down one aisle in [place].  Ultimately this has been ongoing for at least 5 months and she describes some intermittent numbness in the toes that comes and goes as well.  No tissue loss.  Otherwise  motor function intact.  She was evaluated in the emergency room for what she thought was a kidney infection back in May and had a CT scan.  There was a finding of occluded aortobifemoral bypass.  She states that bypass was done here at [facility] by Dr. Kellie Simmering approximately 15 to 20 years ago.  She states she quit smoking about 5 to 6 months ago.  She denies any previous cardiac event.  Denies any chest pain or shortness of breath.  States other than her abdominal aortobifemoral bypass she has had a C-section in the past.  States she cannot live with her legs in this shape.        Past Medical History:  Diagnosis Date  . Asthma   . COPD (chronic obstructive pulmonary disease) ([place])   . DDD (degenerative disc disease), lumbar   . Diabetes mellitus   . Gastroparesis   . GERD without esophagitis   . High cholesterol   . Hypertension   . Hypothyroidism   . PVD (peripheral vascular disease) ([place])          Past Surgical History:  Procedure Laterality Date  . BACK SURGERY    . CORONARY ANGIOPLASTY WITH STENT PLACEMENT    . CORONARY ARTERY BYPASS GRAFT    . FEMORAL ARTERY STENT  right leg  . TUBAL LIGATION           Family History  Problem Relation Age of Onset  . Stroke Mother   . Hypertension  Mother   . Heart failure Father   . Asthma Father   . Diabetes Father   . Heart disease Father   . Emphysema Paternal Grandfather     SOCIAL HISTORY: Social History        Socioeconomic History  . Marital status: Married    Spouse name: Not on file  . Number of children: Not on file  . Years of education: Not on file  . Highest education level: Not on file  Occupational History  . Not on file  Social Needs  . Financial resource strain: Not on file  . Food insecurity    Worry: Not on file    Inability: Not on file  . Transportation needs    Medical: Not on file    Non-medical: Not on file  Tobacco Use  . Smoking status: Former Smoker     Packs/day: 0.00    Years: 36.00    Pack years: 0.00    Types: Cigarettes  . Smokeless tobacco: Never Used  . Tobacco comment: smokes half a cig every am  Substance and Sexual Activity  . Alcohol use: No  . Drug use: No  . Sexual activity: Yes    Birth control/protection: Surgical    Comment: tubal  Lifestyle  . Physical activity    Days per week: Not on file    Minutes per session: Not on file  . Stress: Not on file  Relationships  . Social Herbalist on phone: Not on file    Gets together: Not on file    Attends religious service: Not on file    Active member of club or organization: Not on file    Attends meetings of clubs or organizations: Not on file    Relationship status: Not on file  . Intimate partner violence    Fear of current or ex partner: Not on file    Emotionally abused: Not on file    Physically abused: Not on file    Forced sexual activity: Not on file  Other Topics Concern  . Not on file  Social History Narrative  . Not on file    No Known Allergies        Current Outpatient Medications  Medication Sig Dispense Refill  . aspirin EC 81 MG tablet Take 81 mg by mouth every morning.     . budesonide-formoterol (SYMBICORT) 160-4.5 MCG/ACT inhaler Inhale 2 puffs into the lungs 2 (two) times daily.    . Carboxymethylcellul-Glycerin (CLEAR EYES FOR DRY EYES OP) Place 2 drops into both eyes 2 (two) times daily.    . Cholecalciferol (VITAMIN D3) 1000 units CAPS Take 1 capsule by mouth 2 (two) times a day.     . esomeprazole (NEXIUM) 20 MG capsule Take 20 mg by mouth daily.     Marland Kitchen GARLIC PO Take 1 tablet by mouth daily.    Marland Kitchen glucose blood (ACCU-CHEK AVIVA) test strip Test glucose 4 times a day 150 each 3  . insulin aspart (NOVOLOG FLEXPEN) 100 UNIT/ML FlexPen Inject 10-16 Units into the skin 3 (three) times daily with meals. (Patient taking differently: Inject 20 Units into the skin at bedtime. ) 15  mL 2  . Insulin Pen Needle (B-D UF III MINI PEN NEEDLES) 31G X 5 MM MISC Use qhs 100 each 5  . JANUMET XR [phone] MG TB24 Take 1 tablet by mouth daily.     Marland Kitchen levothyroxine (SYNTHROID, LEVOTHROID) 25 MCG tablet Take 25 mcg by  mouth daily before breakfast.    . lisinopril (PRINIVIL,ZESTRIL) 20 MG tablet Take 20 mg by mouth daily.    Marland Kitchen lovastatin (MEVACOR) 20 MG tablet Take 20 mg by mouth at bedtime.    . metoCLOPramide (REGLAN) 5 MG tablet Take 1 tablet (5 mg total) by mouth 4 (four) times daily -  before meals and at bedtime. 90 tablet 0  . Multiple Vitamin (MULTIVITAMIN WITH MINERALS) TABS tablet Take 1 tablet by mouth daily.    . polyethylene glycol (MIRALAX / GLYCOLAX) packet Take 17 g by mouth daily. 14 each 0  . HYDROcodone-acetaminophen (NORCO/VICODIN) 5-325 MG tablet Take 2 tablets by mouth every 4 (four) hours as needed. (Patient not taking: Reported on 09/04/2018) 10 tablet 0   No current facility-administered medications for this visit.     REVIEW OF SYSTEMS:  [X]  denotes positive finding, [ ]  denotes negative finding Cardiac  Comments:  Chest pain or chest pressure:    Shortness of breath upon exertion:    Short of breath when lying flat:    Irregular heart rhythm:        Vascular    Pain in calf, thigh, or hip brought on by ambulation: x Both legs  Pain in feet at night that wakes you up from your sleep:     Blood clot in your veins:    Leg swelling:         Pulmonary    Oxygen at home:    Productive cough:     Wheezing:         Neurologic    Sudden weakness in arms or legs:     Sudden numbness in arms or legs:     Sudden onset of difficulty speaking or slurred speech:    Temporary loss of vision in one eye:     Problems with dizziness:         Gastrointestinal    Blood in stool:     Vomited blood:         Genitourinary    Burning when urinating:     Blood in urine:         Psychiatric    Major depression:         Hematologic    Bleeding problems:    Problems with blood clotting too easily:        Skin    Rashes or ulcers:        Constitutional    Fever or chills:      PHYSICAL EXAM:    Vitals:   09/04/18 1116  BP: (!) 168/77  Pulse: 83  Resp: 14  Temp: 98.9 F (37.2 C)  TempSrc: Temporal  SpO2: 100%  Weight: 136 lb (61.7 kg)  Height: 4\' 11"  (1.499 m)    GENERAL: She appears older than stated age. CARDIAC: There is a regular rate and rhythm.  VASCULAR:  Radial pulse palpable both upper extremities No palpable femoral pulses Vertical groin incisions in both groins healed Monophasic DP signals bilateral lower extremities No tissue loss or mottling, can move her toes PULMONARY: There is good air exchange bilaterally without wheezing or rales. ABDOMEN: Soft and non-tender with normal pitched bowel sounds.  Well healed midline incision MUSCULOSKELETAL: There are no major deformities or cyanosis. NEUROLOGIC: No focal weakness or paresthesias are detected. SKIN: There are no ulcers or rashes noted. PSYCHIATRIC: The patient has a normal affect.  DATA:   I independently reviewed her CTA abdomen pelvis from 08/09/2018 and this shows  a aortobifemoral bypass sewn end-to-end.  The aorta is occluded just below the level of the renal arteries and entire length of the aortobifemoral graft is occluded.  She does reconstitute her native external iliacs and common femorals/proximal SFA/profunda patent bilaterally.  Assessment/Plan:  29 old female who presents with occluded aortobifemoral bypass that was done by Dr. Kellie Simmering approximately 15 to 20 years ago.  She has been having progressive lower extremity claudication symptoms over the last 5 months now to the point that she cannot even walk down half an aisle at [place].  She does not have any evidence of tissue loss or rest pain at this time.  She states she cannot go  on living her life in her current state.  Given her age of 53 I do not think a axillary bifemoral bypass will be durable.  I have recommended a redo aortobifemoral bypass.  I explained to her in detail that this is a very involved and invasive operation that carries a high risk of morbidity and mortality particularly given the redo nature of the operation.  I quoted her >30% risk of major complication including renal failure require dialysis, bleeding requiring return to OR, graft infection, wound failure, bowel injury, and even risk of death.  She wants to proceed and states she cannot live in this state.  I will get an echocardiogram to ensure she can tolerate open aortic surgery again.  No history of cardiac event and denies CP/SOB, etc.  Will schedule surgery for 09/27/18.   Marty Heck, MD [facility] Office: [phone] Pager: [phone]

## 2018-09-27 NOTE — Anesthesia Procedure Notes (Signed)
Arterial Line Insertion Performed by: Lance Coon, CRNA, CRNA  Preanesthetic checklist: patient identified, IV checked, site marked and surgical consent Right, radial was placed Catheter size: 20 G Hand hygiene performed  and maximum sterile barriers used   Attempts: 2 Procedure performed without using ultrasound guided technique. Following insertion, line sutured and Biopatch. Post procedure assessment: normal and unchanged  Patient tolerated the procedure well with no immediate complications.

## 2018-09-27 NOTE — Anesthesia Procedure Notes (Signed)
Central Venous Catheter Insertion Performed by: Oleta Mouse, MD, anesthesiologist Start/End7/16/2020 7:01 AM, 09/27/2018 7:10 AM Patient location: Pre-op. Preanesthetic checklist: patient identified, IV checked, site marked, risks and benefits discussed, surgical consent, monitors and equipment checked, pre-op evaluation, timeout performed and anesthesia consent Lidocaine 1% used for infiltration and patient sedated Hand hygiene performed  and maximum sterile barriers used  Catheter size: 9 Fr Total catheter length 10. MAC introducer Procedure performed using ultrasound guided technique. Ultrasound Notes:anatomy identified, needle tip was noted to be adjacent to the nerve/plexus identified, no ultrasound evidence of intravascular and/or intraneural injection and image(s) printed for medical record Attempts: 1 Following insertion, line sutured, dressing applied and Biopatch. Post procedure assessment: blood return through all ports, free fluid flow and no air  Patient tolerated the procedure well with no immediate complications.

## 2018-09-27 NOTE — Progress Notes (Signed)
  Day of Surgery Note    Subjective:  Just out from surgery   Vitals:   09/27/18 0555  BP: (!) 199/78  Pulse: 79  Resp: 18  Temp: 98.2 F (36.8 C)  SpO2: 99%    Incisions:   Bilateral groins are clean and dry without hematoma; laparotomy incision is clean and dry Extremities:  Brisk doppler signals DP/PT/peroneal bilaterally Cardiac:  regular Lungs:  Non labored Abdomen:  Soft   Assessment/Plan:  This is a 53 y.o. female who is s/p  Redo aortobifemoral bypass grafting  -pt with brisk doppler signals bilateral feet -bilateral groins are soft without hematoma -neo off; scheduled IV metoprolol ordered q6h -continue npo -to 2 CVICU later this afternoon.   Leontine Locket, PA-C 09/27/2018 1:38 PM [phone]

## 2018-09-28 ENCOUNTER — Inpatient Hospital Stay (HOSPITAL_COMMUNITY): Payer: BC Managed Care – PPO

## 2018-09-28 LAB — CBC
HCT: 35.1 % — ABNORMAL LOW (ref 36.0–46.0)
Hemoglobin: 12.1 g/dL (ref 12.0–15.0)
MCH: 32.2 pg (ref 26.0–34.0)
MCHC: 34.5 g/dL (ref 30.0–36.0)
MCV: 93.4 fL (ref 80.0–100.0)
Platelets: 120 10*3/uL — ABNORMAL LOW (ref 150–400)
RBC: 3.76 MIL/uL — ABNORMAL LOW (ref 3.87–5.11)
RDW: 14.3 % (ref 11.5–15.5)
WBC: 12.8 10*3/uL — ABNORMAL HIGH (ref 4.0–10.5)
nRBC: 0 % (ref 0.0–0.2)

## 2018-09-28 LAB — BASIC METABOLIC PANEL
Anion gap: 6 (ref 5–15)
BUN: 19 mg/dL (ref 6–20)
CO2: 20 mmol/L — ABNORMAL LOW (ref 22–32)
Calcium: 7.7 mg/dL — ABNORMAL LOW (ref 8.9–10.3)
Chloride: 116 mmol/L — ABNORMAL HIGH (ref 98–111)
Creatinine, Ser: 0.87 mg/dL (ref 0.44–1.00)
GFR calc Af Amer: >60 mL/min (ref >60)
GFR calc non Af Amer: >60 mL/min (ref >60)
Glucose, Bld: 124 mg/dL — ABNORMAL HIGH (ref 70–99)
Potassium: 3.7 mmol/L (ref 3.5–5.1)
Sodium: 142 mmol/L (ref 135–145)

## 2018-09-28 LAB — COMPREHENSIVE METABOLIC PANEL
ALT: 18 U/L (ref 0–44)
AST: 26 U/L (ref 15–41)
Albumin: 2.6 g/dL — ABNORMAL LOW (ref 3.5–5.0)
Alkaline Phosphatase: 28 U/L — ABNORMAL LOW (ref 38–126)
Anion gap: 8 (ref 5–15)
BUN: 17 mg/dL (ref 6–20)
CO2: 18 mmol/L — ABNORMAL LOW (ref 22–32)
Calcium: 7.5 mg/dL — ABNORMAL LOW (ref 8.9–10.3)
Chloride: 113 mmol/L — ABNORMAL HIGH (ref 98–111)
Creatinine, Ser: 1.04 mg/dL — ABNORMAL HIGH (ref 0.44–1.00)
GFR calc Af Amer: >60 mL/min (ref >60)
GFR calc non Af Amer: >60 mL/min (ref >60)
Glucose, Bld: 321 mg/dL — ABNORMAL HIGH (ref 70–99)
Potassium: 4.4 mmol/L (ref 3.5–5.1)
Sodium: 139 mmol/L (ref 135–145)
Total Bilirubin: 0.6 mg/dL (ref 0.3–1.2)
Total Protein: 4.5 g/dL — ABNORMAL LOW (ref 6.5–8.1)

## 2018-09-28 LAB — GLUCOSE, CAPILLARY
Glucose-Capillary: 103 mg/dL — ABNORMAL HIGH (ref 70–99)
Glucose-Capillary: 162 mg/dL — ABNORMAL HIGH (ref 70–99)
Glucose-Capillary: 228 mg/dL — ABNORMAL HIGH (ref 70–99)
Glucose-Capillary: 268 mg/dL — ABNORMAL HIGH (ref 70–99)
Glucose-Capillary: 80 mg/dL (ref 70–99)
Glucose-Capillary: 90 mg/dL (ref 70–99)

## 2018-09-28 LAB — AMYLASE: Amylase: 20 U/L — ABNORMAL LOW (ref 28–100)

## 2018-09-28 LAB — MAGNESIUM: Magnesium: 1.3 mg/dL — ABNORMAL LOW (ref 1.7–2.4)

## 2018-09-28 MED ORDER — INSULIN ASPART 100 UNIT/ML ~~LOC~~ SOLN
0.0000 [IU] | SUBCUTANEOUS | Status: DC
  Administered 2018-09-28 – 2018-10-01 (×5): 2 [IU] via SUBCUTANEOUS
  Administered 2018-10-02: 1 [IU] via SUBCUTANEOUS
  Administered 2018-10-02 – 2018-10-03 (×3): 2 [IU] via SUBCUTANEOUS
  Administered 2018-10-03 (×2): 1 [IU] via SUBCUTANEOUS
  Administered 2018-10-03: 2 [IU] via SUBCUTANEOUS

## 2018-09-28 MED ORDER — INSULIN DETEMIR 100 UNIT/ML ~~LOC~~ SOLN
12.0000 [IU] | Freq: Every day | SUBCUTANEOUS | Status: DC
  Administered 2018-09-28 – 2018-09-30 (×3): 12 [IU] via SUBCUTANEOUS
  Filled 2018-09-28 (×5): qty 0.12

## 2018-09-28 NOTE — Evaluation (Signed)
Occupational Therapy Evaluation Patient Details Name: Alexandria Webster MRN: [id number] DOB: 1965-04-14 Today's Date: 09/28/2018    History of Present Illness This 53 yo female admitted with occluded aortobifemoral bypass with lifestyle limiting short distance claudication and concern for early rest pain and now s/p redo exposure of the right common femoral artery, redo exposure of the left common femoral artery, redo aortobifemoral bypass   Clinical Impression   This 53 yo female admitted and underwent above presents to acute OT with decreased balance, decreased tolerance for upright (drop in BP and dizziness), and generalized weakness thus affecting her safety and independence with her basic ADLs. She will benefit from acute OT with follow up [place].    Follow Up Recommendations  Home health OT;Supervision/Assistance - 24 hour    Equipment Recommendations  3 in 1 bedside commode       Precautions / Restrictions Precautions Precautions: Fall Precaution Comments: monitor BP--soft and symptomatic (dizzy), only EOB on eval Restrictions Weight Bearing Restrictions: No      Mobility Bed Mobility Overal bed mobility: Needs Assistance Bed Mobility: Rolling;Sidelying to Sit Rolling: Min assist Sidelying to sit: Min assist          Transfers                 General transfer comment: Unable to transfer today due to soft BP and c/o dizziness    Balance Overall balance assessment: Needs assistance Sitting-balance support: Single extremity supported;Feet supported Sitting balance-Leahy Scale: Poor                                     ADL either performed or assessed with clinical judgement   ADL Overall ADL's : Needs assistance/impaired Eating/Feeding: NPO Eating/Feeding Details (indicate cue type and reason): But would be independent if allowed to eat Grooming: Set up;Supervision/safety;Sitting Grooming Details (indicate cue type and reason): EOB Upper  Body Bathing: Supervision/ safety;Set up;Sitting Upper Body Bathing Details (indicate cue type and reason): EOB Lower Body Bathing: Maximal assistance;Bed level   Upper Body Dressing : Minimal assistance;Sitting Upper Body Dressing Details (indicate cue type and reason): EOB Lower Body Dressing: Total assistance;Bed level                       Vision Patient Visual Report: No change from baseline              Pertinent Vitals/Pain Pain Assessment: No/denies pain     Hand Dominance Right   Extremity/Trunk Assessment Upper Extremity Assessment Upper Extremity Assessment: Generalized weakness           Communication Communication Communication: No difficulties   Cognition Arousal/Alertness: Awake/alert Behavior During Therapy: WFL for tasks assessed/performed Overall Cognitive Status: Within Functional Limits for tasks assessed                                                Home Living Family/patient expects to be discharged to:: Private residence Living Arrangements: Spouse/significant other;Children Available Help at Discharge: Family;Available 24 hours/day   Home Access: Stairs to enter [employer] of Steps: 4-5 Entrance Stairs-Rails: Right;Left;Can reach both Home Layout: One level     Bathroom Shower/Tub: Walk-in shower(at dtrs)   Bathroom Toilet: Standard     Home Equipment: None   Additional  Comments: Is going to stay at dtrs house during the day (while boyfriend is at work) and go home at night      Prior Functioning/Environment Level of Independence: Independent                 OT Problem List: Decreased strength;Decreased range of motion;Decreased activity tolerance;Impaired balance (sitting and/or standing)      OT Treatment/Interventions: Self-care/ADL training;DME and/or AE instruction;Patient/family education;Balance training    OT Goals(Current goals can be found in the care plan section)  Acute Rehab OT Goals Patient Stated Goal: to go to dtrs house during day and her home at night OT Goal Formulation: With patient Time For Goal Achievement: 10/12/18 Potential to Achieve Goals: Good  OT Frequency: Min 2X/week           Co-evaluation PT/OT/SLP Co-Evaluation/Treatment: Yes Reason for Co-Treatment: For patient/therapist safety PT goals addressed during session: Mobility/safety with mobility;Balance;Strengthening/ROM OT goals addressed during session: Strengthening/ROM      AM-PAC OT "6 Clicks" Daily Activity     Outcome Measure Help from another person eating meals?: None Help from another person taking care of personal grooming?: A Little Help from another person toileting, which includes using toliet, bedpan, or urinal?: A Lot Help from another person bathing (including washing, rinsing, drying)?: A Lot Help from another person to put on and taking off regular upper body clothing?: A Little Help from another person to put on and taking off regular lower body clothing?: Total 6 Click Score: 15   End of Session Nurse Communication: (RN in room when noticed BP low)  Activity Tolerance: Other (comment)(limited due to soft BP and dizziness) Patient left:    OT Visit Diagnosis: Other abnormalities of gait and mobility (R26.89);Muscle weakness (generalized) (M62.81);Dizziness and giddiness (R42)                Time: 7858-8502 OT Time Calculation (min): 18 min Charges:  OT General Charges $OT Visit: 1 Visit OT Evaluation $OT Eval Moderate Complexity: 1 Mod  [place], OTR/L Acute [employer] Pager [phone] Office [phone]     Almon Register 09/28/2018, 9:19 AM

## 2018-09-28 NOTE — Progress Notes (Addendum)
Inpatient Diabetes Program Recommendations  AACE/ADA: New Consensus Statement on Inpatient Glycemic Control (2015)  Target Ranges:  Prepandial:   less than 140 mg/dL      Peak postprandial:   less than 180 mg/dL (1-2 hours)      Critically ill patients:  140 - 180 mg/dL   Lab Results  Component Value Date   GLUCAP 268 (H) 09/28/2018   HGBA1C 11.5 (H) 09/24/2018    Review of Glycemic Control Results for DEVITA, NIES (MRN [id number]) as of 09/28/2018 09:45  Ref. Range 09/27/2018 22:18 09/28/2018 06:51 09/28/2018 08:13  Glucose-Capillary Latest Ref Range: 70 - 99 mg/dL 293 (H) 228 (H) 268 (H)   Diabetes history: Type 2 DM Outpatient Diabetes medications: [place] XR [phone] mg QHS, Novolog 10-16 units TID Current orders for Inpatient glycemic control: Novolog 0-15 units TID Decadron 10 mg X 1 on 7/16  Inpatient Diabetes Program Recommendations:    Patient was last seen by Dr Dorris Fetch, endocrinologist in 2017.  At this time consider:   -Novolog 0-9 units Q4H (until diet order placed) - Add Levemir 12 units QD  Addendum: Spoke with patient regarding diabetes management. Reports taking insulin as prescribed and checking blood sugars. Is followed by Dr Maudie Mercury, PCP for DM.  Reviewed patient's current A1c of 11.5%. Explained what a A1c is and what it measures. Also reviewed goal A1c with patient, importance of good glucose control @ home, and blood sugar goals. Reviewed patho of DM, need for insulin, role of pancreas, risk of infection with poor glycemic control, vascular changes and commorbidities.  Patient has a meter and supplies. When asked further about medications, patient reports also taking Toujeo and "thinks the dose is 20 units at night". Additionally, patient states, "I take my Novolog once in the morning and my Toujeo at night." Clarification made that patient was only receiving 2 injections per day. Education provided to patient on short acting insulin, injections schedule and that  the order was written for three times a day with meals. Denies changing her dosages because of lows. Patient expresses understanding. Stressed the importance of close follow up with her diabetes; plans to make appointment. No further questions.   Thanks, Bronson Curb, MSN, RNC-OB Diabetes Coordinator [phone] (8a-5p)

## 2018-09-28 NOTE — Evaluation (Signed)
Physical Therapy Evaluation Patient Details Name: Alexandria Webster MRN: [id number] DOB: 06-May-1965 Today's Date: 09/28/2018   History of Present Illness  This 53 yo female admitted with occluded aortobifemoral bypass with lifestyle limiting short distance claudication and concern for early rest pain and now s/p redo exposure of the right common femoral artery, redo exposure of the left common femoral artery, redo aortobifemoral bypass  Clinical Impression  PTA pt living with boyfriend and independent in mobility and ADLs. Pt currently limited in safe mobility by orthostatic hypotension in the presence of generalized weakness. Pt only able to tolerate 3 min of sitting EoB due to symptoms of hypotension. PT recommending pt initially stay with daughter and have HHPT level rehab. PT will continue to follow acutely.  Orthostatic BPs  Supine 98/52  Sitting 87/30  Sitting after 3 min 66/30  Back in supine 93/59       Follow Up Recommendations Home health PT;Supervision/Assistance - 24 hour    Equipment Recommendations  None recommended by PT       Precautions / Restrictions Precautions Precautions: Fall Precaution Comments: monitor BP--soft and symptomatic (dizzy) Restrictions Weight Bearing Restrictions: No      Mobility  Bed Mobility Overal bed mobility: Needs Assistance Bed Mobility: Rolling;Sidelying to Sit;Sit to Supine Rolling: Min assist Sidelying to sit: Min assist   Sit to supine: Mod assist   General bed mobility comments: pt able to roll into sidelying with minA at shoulders and minA for powerup to sitting, after sitting approximately 3 minutes with decrease in BP modA for bringing pt LE back into bed  Transfers                 General transfer comment: Unable to transfer today due to soft BP and c/o dizziness        Balance Overall balance assessment: Needs assistance Sitting-balance support: Single extremity supported;Feet supported Sitting  balance-Leahy Scale: Poor                                       Pertinent Vitals/Pain Pain Assessment: No/denies pain    Home Living Family/patient expects to be discharged to:: Private residence Living Arrangements: Spouse/significant other;Children Available Help at Discharge: Family;Available 24 hours/day   Home Access: Stairs to enter Entrance Stairs-Rails: Right;Left;Can reach both Entrance Stairs-Number of Steps: 4-5 Home Layout: One level Home Equipment: None Additional Comments: Is going to stay at dtrs house during the day (while boyfriend is at work) and go home at night    Prior Function Level of Independence: Independent               Hand Dominance   Dominant Hand: Right    Extremity/Trunk Assessment   Upper Extremity Assessment Upper Extremity Assessment: Defer to OT evaluation    Lower Extremity Assessment Lower Extremity Assessment: Generalized weakness       Communication   Communication: No difficulties  Cognition Arousal/Alertness: Awake/alert Behavior During Therapy: WFL for tasks assessed/performed Overall Cognitive Status: Within Functional Limits for tasks assessed                                               Assessment/Plan    PT Assessment Patient needs continued PT services  PT Problem List Decreased activity tolerance;Decreased balance;Decreased mobility;Cardiopulmonary status  limiting activity;Pain       PT Treatment Interventions DME instruction;Gait training;Stair training;Functional mobility training;Therapeutic activities;Therapeutic exercise;Balance training;Cognitive remediation;Patient/family education    PT Goals (Current goals can be found in the Care Plan section)  Acute Rehab PT Goals Patient Stated Goal: to go to dtrs house during day and her home at night PT Goal Formulation: With patient Time For Goal Achievement: 10/12/18 Potential to Achieve Goals: Fair    Frequency Min  3X/week     Co-evaluation PT/OT/SLP Co-Evaluation/Treatment: Yes Reason for Co-Treatment: Complexity of the patient's impairments (multi-system involvement) PT goals addressed during session: Mobility/safety with mobility;Balance;Strengthening/ROM OT goals addressed during session: Strengthening/ROM       AM-PAC PT "6 Clicks" Mobility  Outcome Measure Help needed turning from your back to your side while in a flat bed without using bedrails?: A Little Help needed moving from lying on your back to sitting on the side of a flat bed without using bedrails?: A Little Help needed moving to and from a bed to a chair (including a wheelchair)?: Total Help needed standing up from a chair using your arms (e.g., wheelchair or bedside chair)?: Total Help needed to walk in hospital room?: Total Help needed climbing 3-5 steps with a railing? : Total 6 Click Score: 10    End of Session   Activity Tolerance: Treatment limited secondary to medical complications (Comment)(decreased BP) Patient left: in bed;with call bell/phone within reach;with bed alarm set;with nursing/sitter in room Nurse Communication: Mobility status PT Visit Diagnosis: Other abnormalities of gait and mobility (R26.89);Muscle weakness (generalized) (M62.81);Difficulty in walking, not elsewhere classified (R26.2);Dizziness and giddiness (R42);Pain Pain - part of body: (chest/abdomen)    Time: 3845-3646 PT Time Calculation (min) (ACUTE ONLY): 18 min   Charges:   PT Evaluation $PT Eval Moderate Complexity: 1 Mod          Abdallah Hern B. Migdalia Dk PT, DPT Acute Rehabilitation Services Pager [phone] Office [phone]   [place] 09/28/2018, 12:19 PM

## 2018-09-28 NOTE — Progress Notes (Signed)
[facility] [place]  Subjective  - No complaints.  No flatus.  Stable night overall in ICU - remains on 20 mcg neo.   Objective (!) 87/71 90 98.4 F (36.9 C) (Oral) 14 98%  Intake/Output Summary (Last 24 hours) at 09/28/2018 0747 Last data filed at 09/28/2018 0700 Gross per 24 hour  Intake 8690.22 ml  Output 2465 ml  Net 6225.22 ml    Abd and bilateral groins c/d/i - no hematoma Brisk DP/PT signals BLE  Laboratory Lab Results: Recent Labs    09/27/18 1327 09/27/18 2006 09/28/18 0303  WBC 13.0*  --  12.8*  HGB 11.0* 12.7 12.1  HCT 31.6* 36.6 35.1*  PLT 146*  --  120*   BMET Recent Labs    09/27/18 1355 09/28/18 0303  NA 142 139  K 3.1* 4.4  CL 115* 113*  CO2 20* 18*  GLUCOSE 193* 321*  BUN 13 17  CREATININE 0.65 1.04*  CALCIUM 7.5* 7.5*    COAG Lab Results  Component Value Date   INR 1.5 (H) 09/27/2018   INR 1.0 09/24/2018   No results found for: PTT  Assessment/Planning:  POD#1 s/p re-do aortobifem.  N: GCS 15, no deficits, moving feet, has some chronic numbness in both feet that she reports is stable CV: On 20 mcg neo, will attempt to wean today, Hgb stable at 12.7 --> 12.1 P: [place], IS, no respiratory distress GI: NPO, NG, no flatus, will keep NPO today Renal: Will recheck BMP this afternoon, UOP 400 overnight >50 mL/2 hours, Cr 0.65 --> 1.04 (baseline ~0.98)  ID: Ancef prophylaxis FEN: NaCl 125 mL, keep IVF and strict I&O, monitor UOP closely Endocrine: Insulin sliding scale  Overall looks good given complexity of surgery yesterday.  Will keep in ICU today given neo requirement.  Marty Heck 09/28/2018 7:47 AM --

## 2018-09-29 LAB — COMPREHENSIVE METABOLIC PANEL
ALT: 11 U/L (ref 0–44)
AST: 18 U/L (ref 15–41)
Albumin: 2 g/dL — ABNORMAL LOW (ref 3.5–5.0)
Alkaline Phosphatase: 34 U/L — ABNORMAL LOW (ref 38–126)
Anion gap: 3 — ABNORMAL LOW (ref 5–15)
BUN: 18 mg/dL (ref 6–20)
CO2: 21 mmol/L — ABNORMAL LOW (ref 22–32)
Calcium: 7.7 mg/dL — ABNORMAL LOW (ref 8.9–10.3)
Chloride: 119 mmol/L — ABNORMAL HIGH (ref 98–111)
Creatinine, Ser: 0.73 mg/dL (ref 0.44–1.00)
GFR calc Af Amer: >60 mL/min (ref >60)
GFR calc non Af Amer: >60 mL/min (ref >60)
Glucose, Bld: 105 mg/dL — ABNORMAL HIGH (ref 70–99)
Potassium: 3.8 mmol/L (ref 3.5–5.1)
Sodium: 143 mmol/L (ref 135–145)
Total Bilirubin: 0.7 mg/dL (ref 0.3–1.2)
Total Protein: 3.9 g/dL — ABNORMAL LOW (ref 6.5–8.1)

## 2018-09-29 LAB — CBC WITH DIFFERENTIAL/PLATELET
Abs Immature Granulocytes: 0.06 10*3/uL (ref 0.00–0.07)
Basophils Absolute: 0 10*3/uL (ref 0.0–0.1)
Basophils Relative: 0 %
Eosinophils Absolute: 0.1 10*3/uL (ref 0.0–0.5)
Eosinophils Relative: 1 %
HCT: 27.7 % — ABNORMAL LOW (ref 36.0–46.0)
Hemoglobin: 9.1 g/dL — ABNORMAL LOW (ref 12.0–15.0)
Immature Granulocytes: 1 %
Lymphocytes Relative: 8 %
Lymphs Abs: 0.9 10*3/uL (ref 0.7–4.0)
MCH: 32.2 pg (ref 26.0–34.0)
MCHC: 32.9 g/dL (ref 30.0–36.0)
MCV: 97.9 fL (ref 80.0–100.0)
Monocytes Absolute: 1.2 10*3/uL — ABNORMAL HIGH (ref 0.1–1.0)
Monocytes Relative: 11 %
Neutro Abs: 8.6 10*3/uL — ABNORMAL HIGH (ref 1.7–7.7)
Neutrophils Relative %: 79 %
Platelets: 79 10*3/uL — ABNORMAL LOW (ref 150–400)
RBC: 2.83 MIL/uL — ABNORMAL LOW (ref 3.87–5.11)
RDW: 14.5 % (ref 11.5–15.5)
WBC: 10.8 10*3/uL — ABNORMAL HIGH (ref 4.0–10.5)
nRBC: 0 % (ref 0.0–0.2)

## 2018-09-29 LAB — GLUCOSE, CAPILLARY
Glucose-Capillary: 119 mg/dL — ABNORMAL HIGH (ref 70–99)
Glucose-Capillary: 63 mg/dL — ABNORMAL LOW (ref 70–99)
Glucose-Capillary: 79 mg/dL (ref 70–99)
Glucose-Capillary: 84 mg/dL (ref 70–99)
Glucose-Capillary: 84 mg/dL (ref 70–99)
Glucose-Capillary: 84 mg/dL (ref 70–99)
Glucose-Capillary: 95 mg/dL (ref 70–99)

## 2018-09-29 MED ORDER — DEXTROSE 50 % IV SOLN
INTRAVENOUS | Status: AC
  Administered 2018-09-29: 50 mL
  Filled 2018-09-29: qty 50

## 2018-09-29 NOTE — Progress Notes (Signed)
Physical Therapy Treatment Patient Details Name: Alexandria Webster MRN: [id number] DOB: Jul 05, 1965 Today's Date: 09/29/2018    History of Present Illness This 53 yo female admitted with occluded aortobifemoral bypass with lifestyle limiting short distance claudication and concern for early rest pain and now s/p redo exposure of the right common femoral artery, redo exposure of the left common femoral artery, redo aortobifemoral bypass    PT Comments    Pt continues to have orthostatic hypotension but was able to sit x 6 min to have BM and then transfer to chair. Pt with c/o dizziness. See BPs below. Pt eager to move however does become tachy up 135bpm from 110bpm. Pt with noted SOB and quickly fatigues as well. Pt's drop in BP and dizziness limiting ambulation at this time.  BP at rest/supine 116/60 BP sitting 92/50 Art line reading dropped to 60/30 but slowly recovered once std pvt transfer completed and seated on BSC BP s/p sitting on BSC x 6 min 85/48 Pt then stood again for hygiene and dropped into 60s/30s, then recovered to 83/59 once in chair. BP in chair s/p 1 min 101/54   Suspect once patients BP is under control she will progress well from mobility stand point however may need to look into SNF if vitals continue to be problematic. Acute PT to cont to follow.  Follow Up Recommendations  Home health PT;Supervision/Assistance - 24 hour     Equipment Recommendations  Rolling walker with 5" wheels    Recommendations for Other Services       Precautions / Restrictions Precautions Precautions: Fall Precaution Comments: monitor BP--soft and symptomatic (dizzy) Restrictions Weight Bearing Restrictions: No    Mobility  Bed Mobility Overal bed mobility: Needs Assistance Bed Mobility: Rolling;Sidelying to Sit Rolling: Min assist Sidelying to sit: Min assist       General bed mobility comments: increased time, HOB elevated, use of bed rail, minA for trunk  elevation  Transfers Overall transfer level: Needs assistance Equipment used: 1 person hand held assist Transfers: Sit to/from [facility] Sit to Stand: Min assist Stand pivot transfers: Min assist       General transfer comment: increased time, minA to power up and steady once in standing and during std pvt transfers, verbal cues for sequencing steps to [facility]  Ambulation/Gait             General Gait Details: limited to std pvt to [facility] then to chair due to SOB, inc HR, and drop in BP   Stairs             Wheelchair Mobility    Modified Rankin (Stroke Patients Only)       Balance Overall balance assessment: Needs assistance Sitting-balance support: Single extremity supported;Feet supported Sitting balance-Leahy Scale: Fair     Standing balance support: Bilateral upper extremity supported Standing balance-Leahy Scale: Fair Standing balance comment: pt requires physical assist or RW                            Cognition Arousal/Alertness: Awake/alert Behavior During Therapy: WFL for tasks assessed/performed Overall Cognitive Status: Within Functional Limits for tasks assessed                                        Exercises      General Comments General comments (skin integrity, edema, etc.): pt  with abdominal incision, pt assisted to the [facility], pt with + BM and urine      Pertinent Vitals/Pain Pain Assessment: No/denies pain    Home Living                      Prior Function            PT Goals (current goals can now be found in the care plan section) Progress towards PT goals: Progressing toward goals    Frequency    Min 3X/week      PT Plan Current plan remains appropriate    Co-evaluation              AM-PAC PT "6 Clicks" Mobility   Outcome Measure  Help needed turning from your back to your side while in a flat bed without using bedrails?: A Little Help needed moving from  lying on your back to sitting on the side of a flat bed without using bedrails?: A Little Help needed moving to and from a bed to a chair (including a wheelchair)?: A Little Help needed standing up from a chair using your arms (e.g., wheelchair or bedside chair)?: A Little Help needed to walk in hospital room?: A Lot Help needed climbing 3-5 steps with a railing? : A Lot 6 Click Score: 16    End of Session Equipment Utilized During Treatment: Gait belt Activity Tolerance: Patient tolerated treatment well Patient left: in chair;with call bell/phone within reach;with chair alarm set Nurse Communication: Mobility status PT Visit Diagnosis: Other abnormalities of gait and mobility (R26.89);Muscle weakness (generalized) (M62.81);Difficulty in walking, not elsewhere classified (R26.2);Dizziness and giddiness (R42)     Time: 0076-2263 PT Time Calculation (min) (ACUTE ONLY): 26 min  Charges:  $Gait Training: 8-22 mins $Therapeutic Activity: 8-22 mins                     Kittie Plater, PT, DPT Acute Rehabilitation Services Pager #: [phone] Office #: [phone]    [place] Lopes 09/29/2018, 9:08 AM

## 2018-09-29 NOTE — Progress Notes (Signed)
Pt mobilized from chair to [facility] then bed. During transfer BP dropped to 70's in episode similar to that which occurred during PT. Pt minimally symptomatic- complained of mild dizziness. Once back to bed repeat BP WNL. Held scheduled BB as a result. NG residual 19mL, pt denies nausea, has had additional BM this am. NGT removed.

## 2018-09-29 NOTE — Progress Notes (Signed)
[facility] [place]  Subjective  - Off neo.  States passing flatus and several bowel movements overnight.  States feet feel good.   Objective (!) 123/91 (!) 115 98.2 F (36.8 C) (Oral) (!) 21 98%  Intake/Output Summary (Last 24 hours) at 09/29/2018 0915 Last data filed at 09/29/2018 0700 Gross per 24 hour  Intake 3399.88 ml  Output 780 ml  Net 2619.88 ml    Abd and bilateral groins c/d/i - no hematoma - some bruising to lower midline Brisk DP/PT signals BLE Feet motor and sensory intact  Laboratory Lab Results: Recent Labs    09/28/18 0303 09/29/18 0635  WBC 12.8* 10.8*  HGB 12.1 9.1*  HCT 35.1* 27.7*  PLT 120* 79*   BMET Recent Labs    09/28/18 1603 09/29/18 0635  NA 142 143  K 3.7 3.8  CL 116* 119*  CO2 20* 21*  GLUCOSE 124* 105*  BUN 19 18  CREATININE 0.87 0.73  CALCIUM 7.7* 7.7*    COAG Lab Results  Component Value Date   INR 1.5 (H) 09/27/2018   INR 1.0 09/24/2018   No results found for: PTT  Assessment/Planning:  POD#2 s/p re-do aortobifem.  N: GCS 15, no deficits, moving feet, has some chronic numbness in both feet that she reports is stable CV: Off neo since 10 pm last night, Hgb 12.1 --> 9.1, maybe dilutional, hemodynamics ok and weaned off neo, will d/c arterial line today in right wrist P: [place], IS, no respiratory distress, 2 L [place] GI: NPO still, NG, will attempt clamp trial and maybe remove NG since passing flatus and BM, will still keep NPO Renal: Voiding since foley removed, 1.04 --> 0.73  ID: Ancef prophylaxis FEN: NaCl 125 mL, keep IVF and strict I&O, monitor UOP closely Endocrine: Insulin sliding scale  Overall looks good given complexity of surgery yesterday.  Will see how she does throughout the day before moving out of ICU.  Marty Heck 09/29/2018 9:15 AM --

## 2018-09-30 LAB — BASIC METABOLIC PANEL
Anion gap: 4 — ABNORMAL LOW (ref 5–15)
BUN: 15 mg/dL (ref 6–20)
CO2: 20 mmol/L — ABNORMAL LOW (ref 22–32)
Calcium: 7.4 mg/dL — ABNORMAL LOW (ref 8.9–10.3)
Chloride: 117 mmol/L — ABNORMAL HIGH (ref 98–111)
Creatinine, Ser: 0.64 mg/dL (ref 0.44–1.00)
GFR calc Af Amer: >60 mL/min (ref >60)
GFR calc non Af Amer: >60 mL/min (ref >60)
Glucose, Bld: 253 mg/dL — ABNORMAL HIGH (ref 70–99)
Potassium: 3.5 mmol/L (ref 3.5–5.1)
Sodium: 141 mmol/L (ref 135–145)

## 2018-09-30 LAB — GLUCOSE, CAPILLARY
Glucose-Capillary: 68 mg/dL — ABNORMAL LOW (ref 70–99)
Glucose-Capillary: 126 mg/dL — ABNORMAL HIGH (ref 70–99)
Glucose-Capillary: 93 mg/dL (ref 70–99)
Glucose-Capillary: 161 mg/dL — ABNORMAL HIGH (ref 70–99)
Glucose-Capillary: 197 mg/dL — ABNORMAL HIGH (ref 70–99)
Glucose-Capillary: 178 mg/dL — ABNORMAL HIGH (ref 70–99)

## 2018-09-30 LAB — CBC
HCT: 24.6 % — ABNORMAL LOW (ref 36.0–46.0)
Hemoglobin: 8 g/dL — ABNORMAL LOW (ref 12.0–15.0)
MCH: 32.3 pg (ref 26.0–34.0)
MCHC: 32.5 g/dL (ref 30.0–36.0)
MCV: 99.2 fL (ref 80.0–100.0)
Platelets: 89 10*3/uL — ABNORMAL LOW (ref 150–400)
RBC: 2.48 MIL/uL — ABNORMAL LOW (ref 3.87–5.11)
RDW: 14.1 % (ref 11.5–15.5)
WBC: 8.7 10*3/uL (ref 4.0–10.5)
nRBC: 0 % (ref 0.0–0.2)

## 2018-09-30 MED ORDER — DEXTROSE 50 % IV SOLN
INTRAVENOUS | Status: AC
  Administered 2018-09-30: 50 mL
  Filled 2018-09-30: qty 50

## 2018-09-30 NOTE — Plan of Care (Signed)
  Problem: Clinical Measurements: Goal: Ability to maintain clinical measurements within normal limits will improve Outcome: Progressing Goal: Will remain free from infection Outcome: Progressing Goal: Diagnostic test results will improve Outcome: Progressing Goal: Respiratory complications will improve Outcome: Progressing Goal: Cardiovascular complication will be avoided Outcome: Progressing   Problem: Activity: Goal: Risk for activity intolerance will decrease Outcome: Progressing   Problem: Coping: Goal: Level of anxiety will decrease Outcome: Progressing   Problem: Elimination: Goal: Will not experience complications related to urinary retention Outcome: Progressing   Problem: Pain Managment: Goal: General experience of comfort will improve Outcome: Progressing   Problem: Safety: Goal: Ability to remain free from injury will improve Outcome: Progressing   Problem: Skin Integrity: Goal: Risk for impaired skin integrity will decrease Outcome: Progressing   Problem: Cardiovascular: Goal: Ability to achieve and maintain adequate cardiovascular perfusion will improve Outcome: Progressing

## 2018-09-30 NOTE — Progress Notes (Signed)
[facility] [place]  Subjective  - Feels good today.  Tolerated NG removal with no nausea or emesis.   Objective 139/87 96 98.3 F (36.8 C) (Oral) 20 100%  Intake/Output Summary (Last 24 hours) at 09/30/2018 1016 Last data filed at 09/30/2018 0900 Gross per 24 hour  Intake 3041.45 ml  Output 800 ml  Net 2241.45 ml    Abd and bilateral groins c/d/i - no hematoma - some bruising to lower midline Brisk DP/PT signals BLE - PT palpable bilaterally Feet motor and sensory intact  Laboratory Lab Results: Recent Labs    09/29/18 0635 09/30/18 0346  WBC 10.8* 8.7  HGB 9.1* 8.0*  HCT 27.7* 24.6*  PLT 79* 89*   BMET Recent Labs    09/29/18 0635 09/30/18 0346  NA 143 141  K 3.8 3.5  CL 119* 117*  CO2 21* 20*  GLUCOSE 105* 253*  BUN 18 15  CREATININE 0.73 0.64  CALCIUM 7.7* 7.4*    COAG Lab Results  Component Value Date   INR 1.5 (H) 09/27/2018   INR 1.0 09/24/2018   No results found for: PTT  Assessment/Planning:  POD#3 s/p re-do aortobifem.  N: GCS 15, no deficits, moving feet, has some chronic numbness in both feet that she reports is stable CV: Off neo since Friday, no hypotension overnight, Hgb 9.1 --> 8.0, maybe dilutional, hemodynamics ok, arterial line in wrist d/c'd P: no respiratory distress, off nasal cannula GI: Tolerated NG removal, start CLD today, passing flatus and BM Renal: Voiding since foley removed, 1.04 --> 0.73 --> 0.64 ID: Ancef prophylaxis FEN: NaCl 125 mL --> decrease to 50 mL/hr since starting CLD, keep IVF and strict I&O, monitor UOP closely Endocrine: Insulin sliding scale  Overall looks good given complexity of surgery.  CLD today.  Decrease IVF.  Bedboard for floor, 4E.  Marty Heck 09/30/2018 10:16 AM --

## 2018-09-30 NOTE — Progress Notes (Addendum)
CBG: 63 @1948   Treatment: 57mL of Dextrose 50%  Symptoms: None  Follow-up CBG: Time: 2000 CBG Result:119  Possible Reasons for Event: Patient is NPO  Comments/MD notified: Will continue to monitor    Orpah Greek

## 2018-10-01 LAB — BASIC METABOLIC PANEL
Anion gap: 4 — ABNORMAL LOW (ref 5–15)
BUN: 10 mg/dL (ref 6–20)
CO2: 21 mmol/L — ABNORMAL LOW (ref 22–32)
Calcium: 7.7 mg/dL — ABNORMAL LOW (ref 8.9–10.3)
Chloride: 116 mmol/L — ABNORMAL HIGH (ref 98–111)
Creatinine, Ser: 0.58 mg/dL (ref 0.44–1.00)
GFR calc Af Amer: >60 mL/min (ref >60)
GFR calc non Af Amer: >60 mL/min (ref >60)
Glucose, Bld: 86 mg/dL (ref 70–99)
Potassium: 3.2 mmol/L — ABNORMAL LOW (ref 3.5–5.1)
Sodium: 141 mmol/L (ref 135–145)

## 2018-10-01 LAB — CBC WITH DIFFERENTIAL/PLATELET
Abs Immature Granulocytes: 0.02 10*3/uL (ref 0.00–0.07)
Basophils Absolute: 0 10*3/uL (ref 0.0–0.1)
Basophils Relative: 0 %
Eosinophils Absolute: 0.3 10*3/uL (ref 0.0–0.5)
Eosinophils Relative: 3 %
HCT: 25.3 % — ABNORMAL LOW (ref 36.0–46.0)
Hemoglobin: 8.3 g/dL — ABNORMAL LOW (ref 12.0–15.0)
Immature Granulocytes: 0 %
Lymphocytes Relative: 16 %
Lymphs Abs: 1.3 10*3/uL (ref 0.7–4.0)
MCH: 31.9 pg (ref 26.0–34.0)
MCHC: 32.8 g/dL (ref 30.0–36.0)
MCV: 97.3 fL (ref 80.0–100.0)
Monocytes Absolute: 0.7 10*3/uL (ref 0.1–1.0)
Monocytes Relative: 9 %
Neutro Abs: 5.5 10*3/uL (ref 1.7–7.7)
Neutrophils Relative %: 72 %
Platelets: 124 10*3/uL — ABNORMAL LOW (ref 150–400)
RBC: 2.6 MIL/uL — ABNORMAL LOW (ref 3.87–5.11)
RDW: 13.4 % (ref 11.5–15.5)
WBC: 7.8 10*3/uL (ref 4.0–10.5)
nRBC: 0 % (ref 0.0–0.2)

## 2018-10-01 LAB — TYPE AND SCREEN
ABO/RH(D): A POS
Antibody Screen: NEGATIVE
Unit division: 0
Unit division: 0
Unit division: 0
Unit division: 0

## 2018-10-01 LAB — GLUCOSE, CAPILLARY
Glucose-Capillary: 107 mg/dL — ABNORMAL HIGH (ref 70–99)
Glucose-Capillary: 120 mg/dL — ABNORMAL HIGH (ref 70–99)
Glucose-Capillary: 139 mg/dL — ABNORMAL HIGH (ref 70–99)
Glucose-Capillary: 163 mg/dL — ABNORMAL HIGH (ref 70–99)
Glucose-Capillary: 53 mg/dL — ABNORMAL LOW (ref 70–99)
Glucose-Capillary: 73 mg/dL (ref 70–99)
Glucose-Capillary: 90 mg/dL (ref 70–99)

## 2018-10-01 LAB — BPAM RBC
Blood Product Expiration Date: 202008052359
Blood Product Expiration Date: 202008052359
Blood Product Expiration Date: 202008122359
Blood Product Expiration Date: 202008122359
ISSUE DATE / TIME: 202007160735
ISSUE DATE / TIME: 202007160735
ISSUE DATE / TIME: 202007161058
ISSUE DATE / TIME: 202007161529
Unit Type and Rh: 6200
Unit Type and Rh: 6200
Unit Type and Rh: 6200
Unit Type and Rh: 6200

## 2018-10-01 MED ORDER — DEXTROSE 50 % IV SOLN
INTRAVENOUS | Status: AC
  Filled 2018-10-01: qty 50

## 2018-10-01 MED ORDER — SODIUM CHLORIDE 0.9 % IV BOLUS
500.0000 mL | Freq: Once | INTRAVENOUS | Status: AC
  Administered 2018-10-01: 500 mL via INTRAVENOUS

## 2018-10-01 MED ORDER — DEXTROSE 50 % IV SOLN
25.0000 g | INTRAVENOUS | Status: AC
  Administered 2018-10-01: 25 g via INTRAVENOUS

## 2018-10-01 MED ORDER — INSULIN DETEMIR 100 UNIT/ML ~~LOC~~ SOLN
10.0000 [IU] | Freq: Every day | SUBCUTANEOUS | Status: DC
  Administered 2018-10-02 – 2018-10-03 (×2): 10 [IU] via SUBCUTANEOUS
  Filled 2018-10-01 (×3): qty 0.1

## 2018-10-01 MED FILL — Sodium Chloride Irrigation Soln 0.9%: Qty: 3000 | Status: AC

## 2018-10-01 MED FILL — Sodium Chloride IV Soln 0.9%: INTRAVENOUS | Qty: 1000 | Status: AC

## 2018-10-01 MED FILL — Heparin Sodium (Porcine) Inj 1000 Unit/ML: INTRAMUSCULAR | Qty: 30 | Status: AC

## 2018-10-01 NOTE — Progress Notes (Signed)
I spoke with Pt's daughter, Alonza Smoker who called for update at 21:00. All questions were answered.   Kennyth Lose, RN

## 2018-10-01 NOTE — Progress Notes (Addendum)
BP in L arm 74/42 (53), R arm 87/60 (59). No symptomatic complaints from patient. She is resting comfortably, lying in bed.  Back pain seems to have resolved.  Vascular team notified.  304 cc on bladder scan, no urge to void at this time. Received order for 500cc fluid bolus, will attempt to get pt up to [facility] following bolus.

## 2018-10-01 NOTE — Progress Notes (Signed)
Pt had hypoglycemic episode at 00:21 am. Her CBG 53 mg/dl. Pt appeared lethargic, diaphoresis, confused, disoriented x 3. She refused to drink apple juice or orange juice.  Hypoglycemic standing order initiated. 50% dextrose 25 mg IV given. Rechecked her CBG 139 mg/dl after 15 minutes of 50% dextrose given. Pt became alert, followed commands, but disoriented x 3. Her BP 99/50 mmHg, HR 95-105, SPO2 room air 96-97%. Continue to monitor.  Kennyth Lose, RN

## 2018-10-01 NOTE — Progress Notes (Addendum)
    Nurse called to discuss hypotension 28-208'H systolic Mentation normal.  Sat up in chair for extended time today with complaint of back pain.  Offered pain medication, patient declined.  Now resting well in bed.  Called about BP systolic 87, HR 99 asymptomatic.  Will give 500 cc NS bolus and CBC f/u in am last HGB 8.0-8.3 stable post op.  No abdominal pain, + BS  Roxy Horseman PA-C

## 2018-10-01 NOTE — Progress Notes (Signed)
Pt had not had urine out put for tonight. Bladder scan got urine retained 314 ml. Encouraged to drink more fluid and will hand off to day shift RN if Pt has urine retention more than 400 ml, she may need intermittent straight cath per protocol.  Her CBG 90 mg/dl checked at 4.00 am. She had no signs of hypoglycemic but complained feeling cold and fatigue, dizziness, needed to rest more..Warm blanket given. She 's more fully alert, awake and oriented x 4. No acute distress noted at this time. Her BP 126/60 mmHg. Metoprolol 25 mg given per order at 05:25 am.   But last night at midnight when she had hypoglycemic episode, Metoprolol was not given due to parameter unmet.   We will continue to monitor.   Kennyth Lose, RN

## 2018-10-01 NOTE — Progress Notes (Addendum)
Notified Dr. Carlis Abbott, on-call and attending physician about Pt has no urine out put for 16 hours and an episode of hypoglycemic last night.   From I/O recorded last of her urine out put was 150 ml at 11 am yesterday and Pt had  no sensation of urinary retention. Order received to increase her 0.9% NSS to 100 ml/ hr and stat CBC,BMP.   Will continue to monitor.  Kennyth Lose, RN

## 2018-10-01 NOTE — Progress Notes (Signed)
Occupational Therapy Treatment Patient Details Name: Alexandria Webster MRN: [id number] DOB: Sep 21, 1965 Today's Date: 10/01/2018    History of present illness This 53 yo female admitted with occluded aortobifemoral bypass with lifestyle limiting short distance claudication and concern for early rest pain and now s/p redo exposure of the right common femoral artery, redo exposure of the left common femoral artery, redo aortobifemoral bypass   OT comments  Pt is making good progress in both hemodynamic stability and in dynamic standing balance this session. Pt required no more than min guard during stand pivot transfer to recliner. Pt did report that her vision is "blurry", eye exam revealed only decreased smoothness in tracking and was overall WNL. Pt demonstrating appropriate awareness in re to hypotension and need for rest breaks.   BP during session as follows:  Supine: 105/50 EOB: 92/61 Immediately following stand pivot transfer: 84/59 Sitting in recliner for 1 minute: 104/57   Follow Up Recommendations  Home health OT;Supervision/Assistance - 24 hour    Equipment Recommendations  3 in 1 bedside commode       Precautions / Restrictions Precautions Precautions: Fall Precaution Comments: monitor BP--soft and symptomatic (dizzy) Restrictions Weight Bearing Restrictions: No       Mobility Bed Mobility Overal bed mobility: Needs Assistance Bed Mobility: Supine to Sit Rolling: Min assist Sidelying to sit: Supervision   Sit to supine: Min assist   General bed mobility comments: Min A to power up from supine  Transfers Overall transfer level: Needs assistance Equipment used: 1 person hand held assist Transfers: Sit to/from [facility] Sit to Stand: Min guard Stand pivot transfers: Min guard       General transfer comment: Min guard during transfer, min cueing for safety    Balance Overall balance assessment: Needs assistance Sitting-balance support:  Single extremity supported;Feet supported Sitting balance-Leahy Scale: Fair Sitting balance - Comments: pt able to sit on BSC x 6 minutes to attempt to urinate   Standing balance support: No upper extremity supported;During functional activity Standing balance-Leahy Scale: Fair Standing balance comment: Pt stood with no UE support with minor perturbations in balance                           ADL either performed or assessed with clinical judgement   ADL Overall ADL's : Needs assistance/impaired                                     Functional mobility during ADLs: Min guard General ADL Comments: Min guard for sit <> stand from EOB and 1 person HHA to complete several steps and pivot to chair               Cognition Arousal/Alertness: Awake/alert Behavior During Therapy: [facility] for tasks assessed/performed Overall Cognitive Status: Within Functional Limits for tasks assessed                                                General Comments Pt polite and motivated to participate.     Pertinent Vitals/ Pain       Pain Assessment: No/denies pain         Frequency  Min 2X/week        Progress Toward Goals  OT  Goals(current goals can now be found in the care plan section)  Progress towards OT goals: Progressing toward goals  Acute Rehab OT Goals Patient Stated Goal: "be not so dizzy" OT Goal Formulation: With patient Time For Goal Achievement: 10/12/18 Potential to Achieve Goals: Good  Plan Discharge plan remains appropriate       AM-PAC OT "6 Clicks" Daily Activity     Outcome Measure   Help from another person eating meals?: None Help from another person taking care of personal grooming?: A Little Help from another person toileting, which includes using toliet, bedpan, or urinal?: A Little Help from another person bathing (including washing, rinsing, drying)?: A Little Help from another person to put on and taking off  regular upper body clothing?: A Little Help from another person to put on and taking off regular lower body clothing?: A Lot 6 Click Score: 18    End of Session    OT Visit Diagnosis: Other abnormalities of gait and mobility (R26.89);Muscle weakness (generalized) (M62.81);Dizziness and giddiness (R42)   Activity Tolerance Patient tolerated treatment well   Patient Left in chair;with call bell/phone within reach           Time: 1132-1150 OT Time Calculation (min): 18 min  Charges: OT General Charges $OT Visit: 1 Visit OT Treatments $Therapeutic Activity: 8-22 mins   Curtis Sites OTR/L  10/01/2018, 12:15 PM

## 2018-10-01 NOTE — Progress Notes (Signed)
Physical Therapy Treatment Patient Details Name: Alexandria Webster MRN: [id number] DOB: [birth year] Today's Date: 10/01/2018    History of Present Illness This 53 yo female admitted with occluded aortobifemoral bypass with lifestyle limiting short distance claudication and concern for early rest pain and now s/p redo exposure of the right common femoral artery, redo exposure of the left common femoral artery, redo aortobifemoral bypass    PT Comments    Pt motivated to participate in therapy, progress limited by continued orthostatic BP with dizziness. Pt asks to attempt to urinate on BSC. Pt able to tolerate sitting on BSC x 6 minutes but unable to urinate. Pt continues with dizziness with standing and is unable to remain standing more than 2-3 seconds this session. BP supine 114/56, BP sitting: 88/51    Follow Up Recommendations  Home health PT;Supervision/Assistance - 24 hour     Equipment Recommendations  Rolling walker with 5" wheels    Recommendations for Other Services       Precautions / Restrictions Precautions Precautions: Fall Precaution Comments: monitor BP--soft and symptomatic (dizzy) Restrictions Weight Bearing Restrictions: No    Mobility  Bed Mobility       Sidelying to sit: Supervision   Sit to supine: Min assist   General bed mobility comments: assist for LEs back into bed and assist for positioning in bed  Transfers Overall transfer level: Needs assistance Equipment used: 1 person hand held assist Transfers: Sit to/from Stand;Stand Pivot Transfers Sit to Stand: Min assist Stand pivot transfers: Min assist       General transfer comment: pt requests to use BSC. pt min A to transfer bed <> BSC. pt anxious due to dizziness  Ambulation/Gait                 Stairs             Wheelchair Mobility    Modified Rankin (Stroke Patients Only)       Balance       Sitting balance - Comments: pt able to sit on BSC x 6 minutes to  attempt to urinate       Standing balance comment: unable to stand long enough to assess BP, limited by dizziness                            Cognition Arousal/Alertness: Awake/alert Behavior During Therapy: WFL for tasks assessed/performed Overall Cognitive Status: Within Functional Limits for tasks assessed                                        Exercises      General Comments        Pertinent Vitals/Pain Pain Assessment: No/denies pain    Home Living                      Prior Function            PT Goals (current goals can now be found in the care plan section) Progress towards PT goals: Progressing toward goals    Frequency    Min 3X/week      PT Plan Current plan remains appropriate    Co-evaluation              AM-PAC PT "6 Clicks" Mobility   Outcome Measure  Help needed turning from your back to  your side while in a flat bed without using bedrails?: A Little Help needed moving from lying on your back to sitting on the side of a flat bed without using bedrails?: A Little Help needed moving to and from a bed to a chair (including a wheelchair)?: A Little Help needed standing up from a chair using your arms (e.g., wheelchair or bedside chair)?: A Little Help needed to walk in hospital room?: A Lot Help needed climbing 3-5 steps with a railing? : A Lot 6 Click Score: 16    End of Session   Activity Tolerance: Patient tolerated treatment well Patient left: in bed;with call bell/phone within reach;with bed alarm set Nurse Communication: Mobility status PT Visit Diagnosis: Other abnormalities of gait and mobility (R26.89);Muscle weakness (generalized) (M62.81);Difficulty in walking, not elsewhere classified (R26.2);Dizziness and giddiness (R42)     Time: 3568-6168 PT Time Calculation (min) (ACUTE ONLY): 23 min  Charges:  $Therapeutic Activity: 23-37 mins                     Isabelle Course, PT,  DPT  Charod Slawinski 10/01/2018, 11:24 AM

## 2018-10-01 NOTE — Progress Notes (Signed)
Two phlebotomists had attempted to get Pt lab this am, but unable to do, so we're awaiting for CBC and BMP per Dr. Carlis Abbott ordered. Phlebotomist will try again later.   Pt's able to urinate 200 ml with amber color urine after she drank 2 cups of water and apple juice and increased 0.9% NSS IV fluid to 100 ml/hr. Continue to monitor.  Kennyth Lose, RN

## 2018-10-01 NOTE — Progress Notes (Signed)
Inpatient Diabetes Program Recommendations  AACE/ADA: New Consensus Statement on Inpatient Glycemic Control (2015)  Target Ranges:  Prepandial:   less than 140 mg/dL      Peak postprandial:   less than 180 mg/dL (1-2 hours)      Critically ill patients:  140 - 180 mg/dL   Lab Results  Component Value Date   GLUCAP 73 10/01/2018   HGBA1C 11.5 (H) 09/24/2018    Review of Glycemic Control Results for Alexandria Webster, Alexandria Webster (MRN [id number]) as of 10/01/2018 10:05  Ref. Range 10/01/2018 00:21 10/01/2018 00:53 10/01/2018 04:16 10/01/2018 08:02  Glucose-Capillary Latest Ref Range: 70 - 99 mg/dL 53 (L) 139 (H) 90 73   Diabetes history: Type 2 DM Outpatient Diabetes medications: Jamumet XR [phone] mg QHS, Novolog 10-16 units TID Current orders for Inpatient glycemic control: Novolog 0-9 units Q4H, Levemir 12 units QD   Inpatient Diabetes Program Recommendations:    Noted hypoglycemic episode of 53 mg/dL on 7/19. Consider decreasing Levemir to 10 units QD.  Secure chat sent to MD. RN notified for AM dose of Levemir.  Thanks, Bronson Curb, MSN, RNC-OB Diabetes Coordinator [phone] (8a-5p)

## 2018-10-01 NOTE — Progress Notes (Signed)
Pt up to bedside commode to attempt BM and started complaining of intense left lower back pain, stated her "kidneys are hurting".  Unable to void or have BM at this time.  Assisted back to the bed.  BP103/67, HR 96, O2 99% on RA, RR 20.  Bladder scanned just to be sure not retaining urine, 200cc on scan (voided once earlier this morning).  Pt also complaining of being very cold, oral temp 98.52F.  Abdomen soft, bowel sounds present, pulses dopplarable in BLE.  Vascular team paged to notify of new onset of the back pain.  Received orders to administer PRN pain meds and continue to monitor.

## 2018-10-01 NOTE — Progress Notes (Addendum)
[facility] [place] and oriented.  Not much appetite, tolerated CLD without problems.   Objective 115/60 81 98.1 F (36.7 C) (Oral) 16 99%  Intake/Output Summary (Last 24 hours) at 10/01/2018 0720 Last data filed at 10/01/2018 0606 Gross per 24 hour  Intake 2171.11 ml  Output 350 ml  Net 1821.11 ml    Feet warm with active range of motion B LE. Abdomin soft with + hypo BS, incision healing well, B groins soft without hematoma. Heart RRR, hypotension    Assessment/Planning: POD # 4 Re-do aortobifem  CLD tolerating, passed flatus, will maintain CLD IVF NS @ 100, will maintain IVF, CR normal low UO 350 last 24 hours, will give 150/hr NS AM labs pending secondary to difficult stick. Moving all 4 extremities Episode of hypoglycemia corrected with PO liquids, cont.SSI Off pressors, hypotension systolic 774  Roxy Horseman 10/01/2018 7:20 AM --  Laboratory Lab Results: Recent Labs    09/29/18 0635 09/30/18 0346  WBC 10.8* 8.7  HGB 9.1* 8.0*  HCT 27.7* 24.6*  PLT 79* 89*   BMET Recent Labs    09/29/18 0635 09/30/18 0346  NA 143 141  K 3.8 3.5  CL 119* 117*  CO2 21* 20*  GLUCOSE 105* 253*  BUN 18 15  CREATININE 0.73 0.64  CALCIUM 7.7* 7.4*    COAG Lab Results  Component Value Date   INR 1.5 (H) 09/27/2018   INR 1.0 09/24/2018   No results found for: PTT  I have seen and evaluated the patient. I agree with the PA note as documented above. Doing well POD#4 s/p redo aortobifem.  Excellent DP/PT signals.  Will leave on CLD since minimal PO intake.  Passing flatus and had bowel movement.  Will increased IVF since poor UOP yesterday and poor PO intake.  BP improved this am.  Hgb 8.0 --> 8.3   Cr improved to 0.58.  Overally doing well given complexity of surgery.  Only other issue is watch hypoglycemia from last night.  Marty Heck, MD [facility] [place] Office:  [phone] Pager: [phone]

## 2018-10-02 ENCOUNTER — Encounter (HOSPITAL_COMMUNITY): Payer: Self-pay | Admitting: Vascular Surgery

## 2018-10-02 LAB — CBC
HCT: 25.7 % — ABNORMAL LOW (ref 36.0–46.0)
Hemoglobin: 8.4 g/dL — ABNORMAL LOW (ref 12.0–15.0)
MCH: 32.7 pg (ref 26.0–34.0)
MCHC: 32.7 g/dL (ref 30.0–36.0)
MCV: 100 fL (ref 80.0–100.0)
Platelets: 134 10*3/uL — ABNORMAL LOW (ref 150–400)
RBC: 2.57 MIL/uL — ABNORMAL LOW (ref 3.87–5.11)
RDW: 13.5 % (ref 11.5–15.5)
WBC: 6.4 10*3/uL (ref 4.0–10.5)
nRBC: 0 % (ref 0.0–0.2)

## 2018-10-02 LAB — GLUCOSE, CAPILLARY
Glucose-Capillary: 108 mg/dL — ABNORMAL HIGH (ref 70–99)
Glucose-Capillary: 116 mg/dL — ABNORMAL HIGH (ref 70–99)
Glucose-Capillary: 117 mg/dL — ABNORMAL HIGH (ref 70–99)
Glucose-Capillary: 147 mg/dL — ABNORMAL HIGH (ref 70–99)
Glucose-Capillary: 160 mg/dL — ABNORMAL HIGH (ref 70–99)
Glucose-Capillary: 170 mg/dL — ABNORMAL HIGH (ref 70–99)

## 2018-10-02 MED ORDER — ASPIRIN EC 81 MG PO TBEC
81.0000 mg | DELAYED_RELEASE_TABLET | ORAL | Status: DC
  Administered 2018-10-02 – 2018-10-04 (×3): 81 mg via ORAL
  Filled 2018-10-02 (×3): qty 1

## 2018-10-02 MED ORDER — HEPARIN SODIUM (PORCINE) 5000 UNIT/ML IJ SOLN
5000.0000 [IU] | Freq: Three times a day (TID) | INTRAMUSCULAR | Status: DC
  Administered 2018-10-02 – 2018-10-04 (×7): 5000 [IU] via SUBCUTANEOUS
  Filled 2018-10-02 (×7): qty 1

## 2018-10-02 MED ORDER — PRAVASTATIN SODIUM 10 MG PO TABS
10.0000 mg | ORAL_TABLET | Freq: Every day | ORAL | Status: DC
  Administered 2018-10-02 – 2018-10-03 (×2): 10 mg via ORAL
  Filled 2018-10-02 (×2): qty 1

## 2018-10-02 MED ORDER — LEVOTHYROXINE SODIUM 25 MCG PO TABS
25.0000 ug | ORAL_TABLET | Freq: Every day | ORAL | Status: DC
  Administered 2018-10-02 – 2018-10-04 (×3): 25 ug via ORAL
  Filled 2018-10-02 (×3): qty 1

## 2018-10-02 MED ORDER — BISACODYL 10 MG RE SUPP
10.0000 mg | Freq: Once | RECTAL | Status: AC
  Administered 2018-10-02: 10 mg via RECTAL
  Filled 2018-10-02: qty 1

## 2018-10-02 NOTE — Plan of Care (Signed)
Continue to monitor

## 2018-10-02 NOTE — Progress Notes (Addendum)
[facility] Progress Note    10/02/2018 7:28 AM 5 Days Post-Op  Subjective:  "I feel pretty good" c/o a little abdominal soreness  Afebrile Systolic improved to 628'B-151'V after IVF bolus/increase IVF rate HR 80's-110's NSR 97% RA  Vitals:   10/02/18 0016 10/02/18 0435  BP: 117/66 118/66  Pulse: (!) 106 100  Resp: (!) 23 20  Temp: 98 F (36.7 C) 98.3 F (36.8 C)  SpO2: 97% 97%    Physical Exam: Cardiac:  regular Lungs:  Non labored Abdomen:  Soft, NT/ND; +BM day after surgery; +flatus; -N/V Incisions:  laparototomy incision and bilateral groin incisions are c/d/i Extremities:  Brisk DP/PT doppler signals bilaterally  CBC    Component Value Date/Time   WBC 6.4 10/02/2018 0323   RBC 2.57 (L) 10/02/2018 0323   HGB 8.4 (L) 10/02/2018 0323   HCT 25.7 (L) 10/02/2018 0323   PLT 134 (L) 10/02/2018 0323   MCV 100.0 10/02/2018 0323   MCH 32.7 10/02/2018 0323   MCHC 32.7 10/02/2018 0323   RDW 13.5 10/02/2018 0323   LYMPHSABS 1.3 10/01/2018 0723   LYMPHSABS PENDING 10/01/2018 0723   MONOABS 0.7 10/01/2018 0723   MONOABS PENDING 10/01/2018 0723   EOSABS 0.3 10/01/2018 0723   EOSABS PENDING 10/01/2018 0723   BASOSABS 0.0 10/01/2018 0723   BASOSABS PENDING 10/01/2018 0723    BMET    Component Value Date/Time   NA 141 10/01/2018 0723   K 3.2 (L) 10/01/2018 0723   CL 116 (H) 10/01/2018 0723   CO2 21 (L) 10/01/2018 0723   GLUCOSE 86 10/01/2018 0723   BUN 10 10/01/2018 0723   CREATININE 0.58 10/01/2018 0723   CALCIUM 7.7 (L) 10/01/2018 0723   GFRNONAA >60 10/01/2018 0723   GFRAA >60 10/01/2018 0723    INR    Component Value Date/Time   INR 1.5 (H) 09/27/2018 1355     Intake/Output Summary (Last 24 hours) at 10/02/2018 0728 Last data filed at 10/02/2018 0700 Gross per 24 hour  Intake 2160 ml  Output 1100 ml  Net 1060 ml     Assessment/Plan:  53 y.o. female is s/p  Redo aortobifemoral bypass grafting 5 Days Post-Op  -Cardiac:  Regular rhythm; BP improved  with IVF.  Will decrease back down to 100cc/hr.  Asa/statin restarted.  -Neuro:  In tact -Pulmonary:  97% on RA; continue using IS/mobilize GI:  Abdomen is soft; +BS but decreased; +flatus and -N/V.  Possibly advance diet today.  She has not had a BM since day after surgery-will give dulcolax supp. -Renal:  Renal function good yesterday-1100cc/24hr UOP.  May need gentle diuresis as she is almost 14k positive -Heme/ID:  Acute surgical blood loss anemia-stable and she is tolerating.  Pt incisions look good and she remains afebrile.  Discussed groin wound care and keeping groins clean and dry to help prevent wound infections.  She expressed understanding.   -General:  No distress.  Needs to mobilize today since BP improved.   -DVT prophylaxis:  Given ABLA is stable, may be able to start sq heparin today-will defer to Dr. Carlis Abbott as to when to restart. -DM:  Glucose stable in the 100's-110's.  Hold restarting home meds until increasing diet.  -hypothyroidism-restart levothyroxine    Leontine Locket, PA-C [facility] [phone] 10/02/2018 7:28 AM  I have seen and evaluated the patient. I agree with the PA note as documented above. Post-op day #5 s/p re-do aortobifem.  Looks good overall.  Needs to work on mobility.  Decrease IVF back to 100.  Advance to regular diet.  Still poor PO intake.  SQ heparin today.  Incisions look good.  Brisk signals in feet.  No aggressive diuresis yes since BP soft.  Hgb stable  Marty Heck, MD [facility] [place] Office: [phone] Pager: [phone]

## 2018-10-02 NOTE — Progress Notes (Signed)
Physical Therapy Treatment Patient Details Name: Alexandria Webster MRN: [id number] DOB: November 16, 1965 Today's Date: 10/02/2018    History of Present Illness This 53 yo female admitted with occluded aortobifemoral bypass with lifestyle limiting short distance claudication and concern for early rest pain and now s/p redo exposure of the right common femoral artery, redo exposure of the left common femoral artery, redo aortobifemoral bypass    PT Comments    Pt with much improved tolerance to sitting and standing today.  BP 153/86 sitting, 116/48 standing with no c/o dizziness.  Pt able to perform gait x 10' with HHA. Pt pleased with improved activity tolerance.   Follow Up Recommendations  Home health PT;Supervision/Assistance - 24 hour     Equipment Recommendations  Rolling walker with 5" wheels    Recommendations for Other Services       Precautions / Restrictions Precautions Precautions: Fall Precaution Comments: monitor BP--soft and symptomatic (dizzy) Restrictions Weight Bearing Restrictions: No    Mobility  Bed Mobility   Bed Mobility: Supine to Sit Rolling: Supervision         General bed mobility comments: supervision with HOB elevated  Transfers       Sit to Stand: Min guard Stand pivot transfers: Min guard       General transfer comment: steadying assist for balance, cues for safety  Ambulation/Gait Ambulation/Gait assistance: Min assist Gait Distance (Feet): 10 Feet Assistive device: 1 person hand held assist       General Gait Details: pt able to perform gait short distance in room with HHA with no c/o dizziness   Stairs             Wheelchair Mobility    Modified Rankin (Stroke Patients Only)       Balance               Standing balance comment: pt able to stand x 2 minutes with no c/o dizziness                            Cognition Arousal/Alertness: Awake/alert Behavior During Therapy: WFL for tasks  assessed/performed Overall Cognitive Status: Within Functional Limits for tasks assessed                                        Exercises      General Comments        Pertinent Vitals/Pain Pain Assessment: No/denies pain    Home Living                      Prior Function            PT Goals (current goals can now be found in the care plan section) Progress towards PT goals: Progressing toward goals    Frequency           PT Plan Current plan remains appropriate    Co-evaluation              AM-PAC PT "6 Clicks" Mobility   Outcome Measure  Help needed turning from your back to your side while in a flat bed without using bedrails?: A Little Help needed moving from lying on your back to sitting on the side of a flat bed without using bedrails?: A Little Help needed moving to and from a bed to a chair (including a  wheelchair)?: A Little Help needed standing up from a chair using your arms (e.g., wheelchair or bedside chair)?: A Little Help needed to walk in hospital room?: A Little Help needed climbing 3-5 steps with a railing? : A Lot 6 Click Score: 17    End of Session   Activity Tolerance: Patient tolerated treatment well Patient left: in chair;with call bell/phone within reach Nurse Communication: Mobility status PT Visit Diagnosis: Other abnormalities of gait and mobility (R26.89);Muscle weakness (generalized) (M62.81);Difficulty in walking, not elsewhere classified (R26.2);Dizziness and giddiness (R42)     Time: 2297-9892 PT Time Calculation (min) (ACUTE ONLY): 15 min  Charges:  $Therapeutic Activity: 8-22 mins                    Isabelle Course, PT, DPT   Cornella Emmer 10/02/2018, 11:11 AM

## 2018-10-03 LAB — GLUCOSE, CAPILLARY
Glucose-Capillary: 173 mg/dL — ABNORMAL HIGH (ref 70–99)
Glucose-Capillary: 193 mg/dL — ABNORMAL HIGH (ref 70–99)
Glucose-Capillary: 131 mg/dL — ABNORMAL HIGH (ref 70–99)
Glucose-Capillary: 150 mg/dL — ABNORMAL HIGH (ref 70–99)
Glucose-Capillary: 138 mg/dL — ABNORMAL HIGH (ref 70–99)
Glucose-Capillary: 90 mg/dL (ref 70–99)

## 2018-10-03 MED ORDER — FUROSEMIDE 10 MG/ML IJ SOLN
10.0000 mg | Freq: Two times a day (BID) | INTRAMUSCULAR | Status: AC
  Administered 2018-10-03 (×2): 10 mg via INTRAVENOUS
  Filled 2018-10-03 (×2): qty 2

## 2018-10-03 MED ORDER — METFORMIN HCL 500 MG PO TABS
500.0000 mg | ORAL_TABLET | Freq: Two times a day (BID) | ORAL | Status: DC
  Administered 2018-10-03 – 2018-10-04 (×3): 500 mg via ORAL
  Filled 2018-10-03 (×3): qty 1

## 2018-10-03 MED ORDER — POTASSIUM CHLORIDE ER 10 MEQ PO TBCR
10.0000 meq | EXTENDED_RELEASE_TABLET | Freq: Two times a day (BID) | ORAL | Status: AC
  Administered 2018-10-03 (×2): 10 meq via ORAL
  Filled 2018-10-03 (×4): qty 1

## 2018-10-03 MED ORDER — LINAGLIPTIN 5 MG PO TABS
5.0000 mg | ORAL_TABLET | Freq: Every day | ORAL | Status: DC
  Administered 2018-10-03 – 2018-10-04 (×2): 5 mg via ORAL
  Filled 2018-10-03 (×2): qty 1

## 2018-10-03 MED ORDER — SITAGLIP PHOS-METFORMIN HCL ER 100-1000 MG PO TB24: 1.0000 | ORAL_TABLET | Freq: Every day | ORAL | Status: DC

## 2018-10-03 NOTE — Progress Notes (Signed)
Physical Therapy Treatment Patient Details Name: Alexandria Webster MRN: [id number] DOB: 09-20-1965 Today's Date: 10/03/2018    History of Present Illness This 53 yo female admitted with occluded aortobifemoral bypass with lifestyle limiting short distance claudication and concern for early rest pain and now s/p redo exposure of the right common femoral artery, redo exposure of the left common femoral artery, redo aortobifemoral bypass    PT Comments    Pt able to perform gait with RW this session.  Min A due to weakness and balance deficits.  Continue to recommend 24 hour supervision at home and HHPT for continued strength and endurance training.    Follow Up Recommendations  Home health PT;Supervision/Assistance - 24 hour     Equipment Recommendations  Rolling walker with 5" wheels    Recommendations for Other Services       Precautions / Restrictions Precautions Precautions: Fall Precaution Comments: monitor BP Restrictions Weight Bearing Restrictions: No    Mobility  Bed Mobility   Bed Mobility: Supine to Sit Rolling: Modified independent (Device/Increase time)   Supine to sit: Modified independent (Device/Increase time) Sit to supine: Modified independent (Device/Increase time)   General bed mobility comments: no assist needed this visit  Transfers   Equipment used: Rolling walker (2 wheeled)   Sit to Stand: Min guard Stand pivot transfers: Min guard       General transfer comment: min guard and cues for safety wtih RW  Ambulation/Gait   Gait Distance (Feet): 75 Feet Assistive device: Rolling walker (2 wheeled)       General Gait Details: pt requires min A for stabilty and safety with gait wtih RW.  wide BOS, mild LOB when turning with RW   Stairs             Wheelchair Mobility    Modified Rankin (Stroke Patients Only)       Balance               Standing balance comment: close supervision for hygiene after toileting and while  washing hands                            Cognition Arousal/Alertness: Awake/alert Behavior During Therapy: WFL for tasks assessed/performed Overall Cognitive Status: Within Functional Limits for tasks assessed                                        Exercises      General Comments        Pertinent Vitals/Pain Pain Assessment: No/denies pain    Home Living                      Prior Function            PT Goals (current goals can now be found in the care plan section) Progress towards PT goals: Progressing toward goals    Frequency    Min 3X/week      PT Plan Current plan remains appropriate    Co-evaluation              AM-PAC PT "6 Clicks" Mobility   Outcome Measure  Help needed turning from your back to your side while in a flat bed without using bedrails?: None Help needed moving from lying on your back to sitting on the side of a flat bed without  using bedrails?: None Help needed moving to and from a bed to a chair (including a wheelchair)?: A Little Help needed standing up from a chair using your arms (e.g., wheelchair or bedside chair)?: A Little Help needed to walk in hospital room?: A Little Help needed climbing 3-5 steps with a railing? : A Lot 6 Click Score: 19    End of Session   Activity Tolerance: Patient tolerated treatment well Patient left: in bed;with call bell/phone within reach;with bed alarm set Nurse Communication: Mobility status PT Visit Diagnosis: Other abnormalities of gait and mobility (R26.89);Muscle weakness (generalized) (M62.81);Difficulty in walking, not elsewhere classified (R26.2);Dizziness and giddiness (R42)     Time: 1638-4665 PT Time Calculation (min) (ACUTE ONLY): 25 min  Charges:  $Gait Training: 8-22 mins $Therapeutic Activity: 8-22 mins                     Isabelle Course, PT, DPT   DONAWERTH,KAREN 10/03/2018, 11:17 AM

## 2018-10-03 NOTE — Plan of Care (Signed)
Continue to monitor

## 2018-10-03 NOTE — Progress Notes (Addendum)
[facility] Progress Note    10/03/2018 7:25 AM 6 Days Post-Op  Subjective:  Says she is doing good this morning.  Tolerated her diet without nausea/vomiting.  Had a BM with suppository.  Sat in chair most of the day.  Afebrile 962'E-366'Q systolic HR 94'T-654'Y NSR 99% RA  Vitals:   10/03/18 0335 10/03/18 0717  BP: 111/63   Pulse: 94   Resp: 19   Temp: 97.9 F (36.6 C)   SpO2: 100% 99%    Physical Exam: Cardiac:  regular Lungs:  Non laboared Abdomen:  Soft, NT/ND; +BM; -N/V Incisions:  Laparotomy incision is healing nicely as well as bilateral groins Extremities:  Brisk doppler signals bilateral feet.  CBC    Component Value Date/Time   WBC 6.4 10/02/2018 0323   RBC 2.57 (L) 10/02/2018 0323   HGB 8.4 (L) 10/02/2018 0323   HCT 25.7 (L) 10/02/2018 0323   PLT 134 (L) 10/02/2018 0323   MCV 100.0 10/02/2018 0323   MCH 32.7 10/02/2018 0323   MCHC 32.7 10/02/2018 0323   RDW 13.5 10/02/2018 0323   LYMPHSABS 1.3 10/01/2018 0723   LYMPHSABS PENDING 10/01/2018 0723   MONOABS 0.7 10/01/2018 0723   MONOABS PENDING 10/01/2018 0723   EOSABS 0.3 10/01/2018 0723   EOSABS PENDING 10/01/2018 0723   BASOSABS 0.0 10/01/2018 0723   BASOSABS PENDING 10/01/2018 0723    BMET    Component Value Date/Time   NA 141 10/01/2018 0723   K 3.2 (L) 10/01/2018 0723   CL 116 (H) 10/01/2018 0723   CO2 21 (L) 10/01/2018 0723   GLUCOSE 86 10/01/2018 0723   BUN 10 10/01/2018 0723   CREATININE 0.58 10/01/2018 0723   CALCIUM 7.7 (L) 10/01/2018 0723   GFRNONAA >60 10/01/2018 0723   GFRAA >60 10/01/2018 0723    INR    Component Value Date/Time   INR 1.5 (H) 09/27/2018 1355     Intake/Output Summary (Last 24 hours) at 10/03/2018 0725 Last data filed at 10/03/2018 0640 Gross per 24 hour  Intake 2455.19 ml  Output 650 ml  Net 1805.19 ml     Assessment/Plan:  53 y.o. female is s/p  S/p redo aortobifemoral bypass grafting 6 Days Post-Op  -Cardiac:  Pt hemodynamically stable; BP still  in the 100's-110's.  Tolerating diet.  Will dc IVF since eating regular diet.  Continue asa/statin -Pulmonary:  99% RA.  Continue IS -GI:  Tolerating regular diet without N/V.  Had BM yesterday with suppository. -Neuro:  In tact -Vascular:  Brisk doppler signals bilateral feet -Renal:  Unsure if I/O is accurate.  Will give gentle diuresis today as she is 15k positive.  Supplement K+ with lasix.  Check labs in am. -Heme/ID:  Pt remains afebrile and incisions look good.  Discussed with pt again the need for dry gauze to bilateral groins while sitting up in chair.   Sq heparin started yesterday.   -DM:  Pt's glucose running around 170's-190's.  Discussed with pt and her sugar runs about that at home.  Discussed with her and will restart her regular dose of Janumet [phone] daily.   Continue with SSI as well.   -General:  Pt sat up in chair most of the day.  States she didn't really walk.  D/w nurse tech that pt needs to ambulate in hallways today.   Anticipate pt may be able to discharge in the next couple of days.     Leontine Locket, PA-C [facility] [phone] 10/03/2018 7:25 AM  I have seen and evaluated the patient. I agree with the PA note as documented above. Doing well POD#6 s/p re-do aortobifem.  Brisk DP/PT signals.  Feet look good.  Walked to bathroom yesterday.  Pain well controlled.  Tolerated PO.  Will get home health arrange today with care management.  Hopefully discharge home tomorrow as mobility continues to improve.  BP better.  Marty Heck, MD [facility] [place] Office: [phone] Pager: [phone]

## 2018-10-04 LAB — BASIC METABOLIC PANEL
Anion gap: 8 (ref 5–15)
BUN: 7 mg/dL (ref 6–20)
CO2: 20 mmol/L — ABNORMAL LOW (ref 22–32)
Calcium: 7.7 mg/dL — ABNORMAL LOW (ref 8.9–10.3)
Chloride: 115 mmol/L — ABNORMAL HIGH (ref 98–111)
Creatinine, Ser: 0.62 mg/dL (ref 0.44–1.00)
GFR calc Af Amer: >60 mL/min (ref >60)
GFR calc non Af Amer: >60 mL/min (ref >60)
Glucose, Bld: 88 mg/dL (ref 70–99)
Potassium: 3.2 mmol/L — ABNORMAL LOW (ref 3.5–5.1)
Sodium: 143 mmol/L (ref 135–145)

## 2018-10-04 LAB — GLUCOSE, CAPILLARY
Glucose-Capillary: 77 mg/dL (ref 70–99)
Glucose-Capillary: 80 mg/dL (ref 70–99)
Glucose-Capillary: 97 mg/dL (ref 70–99)

## 2018-10-04 LAB — CBC
HCT: 26.1 % — ABNORMAL LOW (ref 36.0–46.0)
Hemoglobin: 8.5 g/dL — ABNORMAL LOW (ref 12.0–15.0)
MCH: 32.4 pg (ref 26.0–34.0)
MCHC: 32.6 g/dL (ref 30.0–36.0)
MCV: 99.6 fL (ref 80.0–100.0)
Platelets: 215 10*3/uL (ref 150–400)
RBC: 2.62 MIL/uL — ABNORMAL LOW (ref 3.87–5.11)
RDW: 13.8 % (ref 11.5–15.5)
WBC: 6.5 10*3/uL (ref 4.0–10.5)
nRBC: 0 % (ref 0.0–0.2)

## 2018-10-04 MED ORDER — HYDROCODONE-ACETAMINOPHEN 5-325 MG PO TABS: 1.0000 | ORAL_TABLET | Freq: Four times a day (QID) | ORAL | 0 refills | Status: DC | PRN

## 2018-10-04 NOTE — Progress Notes (Signed)
  [facility] Progress Note    10/04/2018 7:43 AM  Subjective:  Says she is doing good this morning.  Tolerated her diet without nausea/vomiting.  Had a BM with suppository.     Vitals:   10/04/18 0024 10/04/18 0424  BP: 108/62 115/64  Pulse: 89 86  Resp: 17 15  Temp: 98.8 F (37.1 C) 98 F (36.7 C)  SpO2: 100% 100%    Physical Exam: Cardiac:  regular Lungs:  Non laboared Abdomen:  Soft, NT/ND; +BM; -N/V Incisions:  Laparotomy incision is healing nicely as well as bilateral groins Extremities:  Brisk doppler signals bilateral feet.  CBC    Component Value Date/Time   WBC 6.5 10/04/2018 0303   RBC 2.62 (L) 10/04/2018 0303   HGB 8.5 (L) 10/04/2018 0303   HCT 26.1 (L) 10/04/2018 0303   PLT 215 10/04/2018 0303   MCV 99.6 10/04/2018 0303   MCH 32.4 10/04/2018 0303   MCHC 32.6 10/04/2018 0303   RDW 13.8 10/04/2018 0303   LYMPHSABS 1.3 10/01/2018 0723   LYMPHSABS PENDING 10/01/2018 0723   MONOABS 0.7 10/01/2018 0723   MONOABS PENDING 10/01/2018 0723   EOSABS 0.3 10/01/2018 0723   EOSABS PENDING 10/01/2018 0723   BASOSABS 0.0 10/01/2018 0723   BASOSABS PENDING 10/01/2018 0723    BMET    Component Value Date/Time   NA 143 10/04/2018 0303   K 3.2 (L) 10/04/2018 0303   CL 115 (H) 10/04/2018 0303   CO2 20 (L) 10/04/2018 0303   GLUCOSE 88 10/04/2018 0303   BUN 7 10/04/2018 0303   CREATININE 0.62 10/04/2018 0303   CALCIUM 7.7 (L) 10/04/2018 0303   GFRNONAA >60 10/04/2018 0303   GFRAA >60 10/04/2018 0303    INR    Component Value Date/Time   INR 1.5 (H) 09/27/2018 1355     Intake/Output Summary (Last 24 hours) at 10/04/2018 0743 Last data filed at 10/04/2018 0000 Gross per 24 hour  Intake 547 ml  Output 1550 ml  Net -1003 ml     Assessment/Plan:  54 y.o. female is s/p redo aortobifemoral bypass  7 Days Post-Op   Doing well POD#7 s/p re-do aortobifem.  Brisk DP/PT signals.  Feet look good.  Walked to bathroom yesterday.  Pain well controlled.  Tolerated PO.   Will plan for discharge home today.  States she is ready to go.  Will arrange wound check in clinic in 3 weeks.  Hgb stable 8.5.  Cr stable 0.62.  No significant pain.    Marty Heck, MD [facility] [place] Office: [phone] Pager: [phone]

## 2018-10-04 NOTE — Discharge Summary (Signed)
AAA Discharge Summary    Alexandria Webster [birth year] 53 y.o. female  [id number]  Admission Date: 09/27/2018  Discharge Date: 10/04/2018 Physician: Marty Heck, MD  Admission Diagnosis: Aortoiliac occlusive disease [facility]) [I74.09]   HPI:   This is a 53 y.o. female with history of diabetes, asthma, hypertension thatpresents for evaluation of bilateral leg pain. Patient states claudication-like symptoms startedin both lower extremities about 5 months ago. States she really cannot walk at all and [place] even [place]. Ultimately this has beenongoing for at least 5 months and she describes some intermittent numbness in the toes that comes and goesas well. No tissue loss. Otherwise motor function intact. She was evaluated in the emergency room for what she thought was a kidney infection back in May and had a CT scan. There was a finding of occluded aortobifemoral bypass. She states that bypass was done here at [facility] by Dr. Kellie Simmering approximately 15 to 20 years ago. She states she quit smoking about 5 to 6 months ago. She denies any previous cardiac event. Denies any chest pain or shortness of breath. States other than her abdominal aortobifemoral bypass she has had a C-section in the past. States she cannot live with her legs in this shape.  Hospital Course:  The patient was admitted to the hospital and taken to the operating room on 09/27/2018 and underwent: 1.  Redo exposure of the right common femoral artery 2.  Redo exposure of the left common femoral artery 3.  Redo aortobifemoral bypass (14 mm x 7 mm bifurcated Dacron graft)    Findings: Redo exposure of bilateral common femoral arteries using vertical groin incisions where the old limbs of her aortobifem were detached.  After extensive lysis of adhesions the old aortic graft was exposed where it was sewn infrarenal.  Ultimately the left renal vein was fully mobilized we got  Vesseloops around the renal arteries bilaterally.  Initially a suprarenal clamp was placed for 11 minutes and the old graft was transected below the renals and subsequently thrombus was removed and the graft was flushed.  We then moved our clamp to infrarenal position and sewed a new 14 x 7 bifurcated Dacron graft end-to-end to the old graft infra-renal and tunneled under both ureters over the old graft limbs to the bilateral common femoral arteries.  At the completion of the case she had bilateral posterior tibial signals.  The pt tolerated the procedure well and was transported to the PACU in good condition.   By POD 1, she was doing well.  She had no flatus and was kept npo, she had SSI, and IVF continued.  PT/OT recommended HH.  POD 2, overall, looks good considering complexity of surgery.    POD 3, she was doing well.  She was weaned off neo.  She did have acute blood loss anemia, which could also be dilutional.  Her IVF were decreased to 50cc/hr.  She was transferred to stepdown unit.   POD 4, she was doing well.  She did have a low uop and her IVF were increased.  She had an episode of hypoglycemia, which was corrected with po liquids.  She remained off pressors.  She had excellent DP/PT signals.  She was passing flatus and had a BM.  hgb improved.  Creatinine okay.  Continues to do well.    POD 5, pt doing well, BP improved with IVF and these were decreased.  Here asa and statin were restarted.  She was  on RA.  Abdomen soft with +BS/flatus.  She was given a dulcolax.  Renal function remains good.  Tolerating surgical blood loss anemia.  Wounds clean and dry.  Her levothyroxine was restarted.    POD 6, pt doing well.  Continues to have brisk doppler signals.  Ambulating more.  DM meds restarted.   POD 7, she was doing well and discharged home.    The remainder of the hospital course consisted of increasing mobilization and increasing intake of solids without difficulty.  CBC    Component  Value Date/Time   WBC 6.5 10/04/2018 0303   RBC 2.62 (L) 10/04/2018 0303   HGB 8.5 (L) 10/04/2018 0303   HCT 26.1 (L) 10/04/2018 0303   PLT 215 10/04/2018 0303   MCV 99.6 10/04/2018 0303   MCH 32.4 10/04/2018 0303   MCHC 32.6 10/04/2018 0303   RDW 13.8 10/04/2018 0303   LYMPHSABS 1.3 10/01/2018 0723   LYMPHSABS PENDING 10/01/2018 0723   MONOABS 0.7 10/01/2018 0723   MONOABS PENDING 10/01/2018 0723   EOSABS 0.3 10/01/2018 0723   EOSABS PENDING 10/01/2018 0723   BASOSABS 0.0 10/01/2018 0723   BASOSABS PENDING 10/01/2018 0723    BMET    Component Value Date/Time   NA 143 10/04/2018 0303   K 3.2 (L) 10/04/2018 0303   CL 115 (H) 10/04/2018 0303   CO2 20 (L) 10/04/2018 0303   GLUCOSE 88 10/04/2018 0303   BUN 7 10/04/2018 0303   CREATININE 0.62 10/04/2018 0303   CALCIUM 7.7 (L) 10/04/2018 0303   GFRNONAA >60 10/04/2018 0303   GFRAA >60 10/04/2018 0303     Discharge Instructions    Discharge patient   Complete by: As directed    Discharge disposition: 01-Home or Self Care   Discharge patient date: 10/04/2018      Discharge Diagnosis:  Aortoiliac occlusive disease ([place]) [I74.09]  Secondary Diagnosis: Patient Active Problem List   Diagnosis Date Noted  . 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  . Aortoiliac occlusive disease ([place]) 09/27/2018  . 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 Gastritis 10/01/2015  . Gastroparesis due to DM ([place]) 10/01/2015  . Personal history of noncompliance with medical treatment, presenting hazards to health 09/14/2015  . Hypokalemia 11/13/2012  . Unspecified constipation 11/09/2012  . Facial cellulitis 11/07/2012  . Leukocytosis 11/07/2012  . Type 2 diabetes mellitus, uncontrolled ([place]) 11/07/2012  . Asthma 11/07/2012  . PVD (peripheral vascular disease) ([place]) 11/07/2012  . Hyponatremia 11/07/2012  . Tachycardia 11/07/2012  . Nausea & vomiting 11/07/2012  . Essential hypertension, benign    Past  Medical History:  Diagnosis Date  . Asthma   . COPD (chronic obstructive pulmonary disease) ([place])   . DDD (degenerative disc disease), lumbar   . Diabetes mellitus   . Gastroparesis   . GERD without esophagitis   . High cholesterol   . Hypertension   . Hypothyroidism   . PONV (postoperative nausea and vomiting)   . PVD (peripheral vascular disease) ([place])   . Wears glasses      Allergies as of 10/04/2018   No Known Allergies     Medication List    STOP taking these medications   lisinopril 20 MG tablet Commonly known as: ZESTRIL     TAKE these medications   aspirin EC 81 MG tablet Take 81 mg by mouth every morning.   budesonide-formoterol 160-4.5 MCG/ACT inhaler Commonly known as: SYMBICORT Inhale 2 puffs into the lungs 2 (two) times daily.   esomeprazole 20  MG capsule Commonly known as: NEXIUM Take 20 mg by mouth daily.   GARLIC PO Take 1 tablet by mouth daily.   glucose blood test strip Commonly known as: Accu-Chek Aviva Test glucose 4 times a day   HYDROcodone-acetaminophen 5-325 MG tablet Commonly known as: NORCO/VICODIN Take 1 tablet by mouth every 6 (six) hours as needed. What changed:   how much to take  when to take this   insulin aspart 100 UNIT/ML FlexPen Commonly known as: NovoLOG FlexPen Inject 10-16 Units into the skin 3 (three) times daily with meals.   Insulin Pen Needle 31G X 5 MM Misc Commonly known as: B-D UF III MINI PEN NEEDLES Use qhs   Janumet XR [phone] MG Tb24 Generic drug: SitaGLIPtin-MetFORMIN HCl Take 1 tablet by mouth daily.   levothyroxine 25 MCG tablet Commonly known as: SYNTHROID Take 25 mcg by mouth daily before breakfast.   lovastatin 20 MG tablet Commonly known as: MEVACOR Take 20 mg by mouth at bedtime.   metoCLOPramide 5 MG tablet Commonly known as: REGLAN Take 1 tablet (5 mg total) by mouth 4 (four) times daily -  before meals and at bedtime.   multivitamin with minerals Tabs tablet Take 1 tablet by  mouth daily.   polyethylene glycol 17 g packet Commonly known as: MIRALAX / GLYCOLAX Take 17 g by mouth daily. What changed:   when to take this  reasons to take this   Vitamin D3 25 MCG (1000 UT) Caps Take 1,000 Units by mouth 2 (two) times a day.            Durable Medical Equipment  (From admission, onward)         Start     Ordered   10/03/18 1059  For home use only DME 3 n 1  Once     10/03/18 1059   10/03/18 0747  For home use only DME Walker rolling  Once    Question:  Patient needs a walker to treat with the following condition  Answer:  S/P aortobifemoral bypass surgery   10/03/18 0749          Instructions:  [facility] Discharge Instructions   Open Aortic Surgery  Please refer to the following instructions for your post-procedure care. Your surgeon or Physician Assistant will discuss any changes with you.  Activity  Avoid lifting more than eight pounds (a gallon of milk) until after your first post-operative visit. You are encouraged to walk as much as you can. You can slowly return to normal activities but must avoid strenuous activity and heavy lifting until your doctor tells you it's okay. Heavy lifting can hurt the incision and cause a hernia. Avoid activities such as vacuuming or swinging a golf club. It is normal to feel tired for several weeks after your surgery. Do not drive until your doctor gives the okay and you are no longer taking prescription pain medications. It is also normal to have difficulty with sleep habits, eating and bowl movements after surgery. These will go away with time.  Bathing/Showering  Shower daily after you go home. Do not soak in a bathtub, hot tub, or swim until the incision heals.  Incision Care  Shower every day. Clean your incision with mild soap and water. Pat the area dry with a clean towel. You do not need a bandage unless otherwise instructed. Do not apply any ointments or  creams to your incision. You may have skin glue on your incision.  Do not peel it off. It will come off on its own in about one week. If you have staples or sutures along your incision, they will be removed at your post op appointment.  If you have groin incisions, wash the groin wounds with soap and water daily and pat dry. (No tub bath-only shower)  Then put a dry gauze or washcloth in the groin to keep this area dry to help prevent wound infection.  Do this daily and as needed.  Do not use Vaseline or neosporin on your incisions.  Only use soap and water on your incisions and then protect and keep dry.  Diet  Resume your normal diet. There are no special food restriction following this procedure. A low fat/low cholesterol diet is recommended for all patients with vascular disease. After your aortic surgery, it's normal to feel full faster than usual and to not feel as hungry as you normally would. You will probably lose weight initially following your surgery. It's best to eat small, frequent meals over the course of the day. Call the office if you find that you are unable to eat even small meals.   In order to heal from your surgery, it is CRITICAL to get adequate nutrition. Your body requires vitamins, minerals, and protein. Vegetables are the best source of vitamins and minerals.  If you have pain, you may take over-the-counter pain reliever such as acetaminophen (Tylenol). If you were prescribed a stronger pain medication, please be aware these medication can cause nausea and constipation. Prevent nausea by taking the medication with a snack or meal. Avoid constipation by drinking plenty of fluids and eating foods with a high amount of fiber, such as fruits, vegetables and grains. Take 100mg  of the over-the-counter stool softener Colace twice a day as needed to help with constipation. A laxative, such as Milk of Magnesia, may be recommended for you at this time. Do not take a laxative unless your  surgeon or P.A. tells you it's OK.  Do not take Tylenol if you are taking stronger pain medications (such as Percocet).  Stop taking your lisinopril for now until your blood pressure can tolerate.  Follow up with Dr. Maudie Mercury in the next week to have your blood pressure and medications evaluated.  Follow Up  Our office will schedule a follow up appointment 2-3 weeks after discharge.  Please call us immediately for any of the following conditions    .     Severe or worsening pain in your legs or feet or in your abdomen back or chest. . Increased pain, redness drainage (pus) from your incision site. . Increased abdominal pain, bloating, nausea, vomiting, or persistent diarrhea. . Fever of 101 degrees or higher. . Swelling in your leg (s). .  Reduce your risk of vascular disease  . Stop smoking. If you would like help, call [facility] at 1-800-QUIT-NOW 669-854-8737) or [place] at 212-566-8077. . Manage your cholesterol . Maintain a desired weight . Control your diabetes . Keep your blood pressure down .  If you have any questions please call the office at [phone].   Prescriptions given: 1.  Vicodin #30 No Refill  Disposition: home  Patient's condition: is Good  Follow up: 1. Dr. Carlis Abbott in 2-3 weeks   Leontine Locket, PA-C [facility] [phone] 10/04/2018  7:55 AM   - For VQI Registry use -  Post-op:  Time to Extubation: [x]  In OR, [ ]  < 12 hrs, [ ]  12-24 hrs, [ ]  >=  24 hrs Vasopressors Req. Post-op: Yes ICU Stay:  2 day in ICU Transfusion: Yes   If yes, 3 units given MI: No, [ ]  Troponin only, [ ]  EKG or Clinical New Arrhythmia: No  Complications: CHF: No Resp failure: No, [ ]  Pneumonia, [ ]  Ventilator Chg in renal function: No, [ ]  Inc. Cr > 0.5, [ ]  Temp. Dialysis, [ ]  Permanent dialysis Leg ischemia: No, no Surgery needed, [ ]  Yes, Surgery needed, [ ]  Amputation Bowel ischemia: No, [ ]  Medical Rx, [ ]  Surgical Rx Wound  complication: No, [ ]  Superficial separation/infection, [ ]  Return to OR Return to OR: No  Return to OR for bleeding: No Stroke: No, [ ]  Minor, [ ]  Major  Discharge medications: Statin use:  Yes  ASA use:  Yes   Plavix use:  No  Beta blocker use:  No  ACEI use:  No ARB use:  No CCB use:  No Coumadin use:  No

## 2018-10-04 NOTE — Progress Notes (Signed)
Patient with discharge orders for home. Discharge instructions with verbal understanding received. Daughter aware of ready for discharge. 3:1 and walker for home use delivered. IV and telemetry removed. Down to meet daughter via w/c with volunteer.  No complaints. Patient appreciative of care received.

## 2018-10-04 NOTE — Discharge Instructions (Signed)
[facility]  Discharge Instructions   Open Aortic Surgery  Please refer to the following instructions for your post-procedure care. Your surgeon or Physician Assistant will discuss any changes with you.  Activity  Avoid lifting more than eight pounds (a gallon of milk) until after your first post-operative visit. You are encouraged to walk as much as you can. You can slowly return to normal activities but must avoid strenuous activity and heavy lifting until your doctor tells you it's okay. Heavy lifting can hurt the incision and cause a hernia. Avoid activities such as vacuuming or swinging a golf club. It is normal to feel tired for several weeks after your surgery. Do not drive until your doctor gives the okay and you are no longer taking prescription pain medications. It is also normal to have difficulty with sleep habits, eating and bowl movements after surgery. These will go away with time.  Bathing/Showering  Shower daily after you go home. Do not soak in a bathtub, hot tub, or swim until the incision heals.  Incision Care  Shower every day. Clean your incision with mild soap and water. Pat the area dry with a clean towel. You do not need a bandage unless otherwise instructed. Do not apply any ointments or creams to your incision. You may have skin glue on your incision. Do not peel it off. It will come off on its own in about one week. If you have staples or sutures along your incision, they will be removed at your post op appointment.  If you have groin incisions, wash the groin wounds with soap and water daily and pat dry. (No tub bath-only shower)  Then put a dry gauze or washcloth in the groin to keep this area dry to help prevent wound infection.  Do this daily and as needed.  Do not use Vaseline or neosporin on your incisions.  Only use soap and water on your incisions and then protect and keep dry.  Diet  Resume your normal diet. There are no  special food restriction following this procedure. A low fat/low cholesterol diet is recommended for all patients with vascular disease. After your aortic surgery, it's normal to feel full faster than usual and to not feel as hungry as you normally would. You will probably lose weight initially following your surgery. It's best to eat small, frequent meals over the course of the day. Call the office if you find that you are unable to eat even small meals.   In order to heal from your surgery, it is CRITICAL to get adequate nutrition. Your body requires vitamins, minerals, and protein. Vegetables are the best source of vitamins and minerals. If you have pain, you may take over-the-counter pain reliever such as acetaminophen (Tylenol). If you were prescribed a stronger pain medication, please be aware these medication can cause nausea and constipation. Prevent nausea by taking the medication with a snack or meal. Avoid constipation by drinking plenty of fluids and eating foods with a high amount of fiber, such as fruits, vegetables and grains. Take 100mg  of the over-the-counter stool softener Colace twice a day as needed to help with constipation. A laxative, such as Milk of Magnesia, may be recommended for you at this time. Do not take a laxative unless your surgeon or P.A. tells you it's OK.  Do not take Tylenol if you are taking stronger pain medications (such as Percocet).  Stop taking your lisinopril for now until your blood pressure  can tolerate.  Follow up with Dr. Maudie Mercury in the next week to have your blood pressure and medications evaluated.  Follow Up  Our office will schedule a follow up appointment 2-3 weeks after discharge.  Please call us immediately for any of the following conditions    .     Severe or worsening pain in your legs or feet or in your abdomen back or chest. Increased pain, redness drainage (pus) from your incision site. Increased abdominal pain, bloating, nausea, vomiting, or  persistent diarrhea. Fever of 101 degrees or higher. Swelling in your leg (s).  Reduce your risk of vascular disease  Stop smoking. If you would like help, call [facility] at 1-800-QUIT-NOW 970 577 2812) or [place] at [phone]. Manage your cholesterol Maintain a desired weight Control your diabetes Keep your blood pressure down  If you have any questions please call the office at [phone].

## 2018-10-04 NOTE — TOC Transition Note (Signed)
Transition of Care [facility]) - CM/SW Discharge Note Marvetta Gibbons RN, BSN Transitions of Care Unit 4E- RN Case Manager [phone]   Patient Details  Name: Alexandria Webster MRN: [id number] Date of Birth: May 18, 1965  Transition of Care [facility]) CM/SW Contact:  Dawayne Patricia, RN Phone Number: 10/04/2018, 10:17 AM   Clinical Narrative:    Pt stable for transition home today, orders placed for DME and HH needs- CM spoke with pt on 7/22 regarding transition needs- list provided to pt Per CMS guidelines from medicare.gov website with star ratings (copy placed in chart) for [facility] choice- per pt she would like to use [facility] for needs [facility] as second choice) call made to [facility] with [facility] for [facility] referral PT/OT- referral has been accepted. Notified Zach with [employer] for DME needs- RW and 3n1 to be delivered to room prior to discharge.   Final next level of care: [place] Barriers to Discharge: Barriers Resolved, No Barriers Identified   Patient Goals and CMS Choice Patient states their goals for this hospitalization and ongoing recovery are:: "to go home and be able to do things again CMS Medicare.gov Compare Post Acute Care list provided to:: Patient Choice offered to / list presented to : Patient  Discharge Placement  Home with [facility]                     Discharge Plan and Services   Discharge Planning Services: CM Consult Post Acute Care Choice: Home Health, Durable Medical Equipment          DME Arranged: 3-N-1, Walker rolling DME Agency: [employer] Date DME Agency Contacted: 10/04/18 Time DME Agency Contacted: 81 Representative spoke with at DME Agency: [place]: PT, OT [place] Agency: [place] ([place]) Date [place]: 10/04/18 Time [place]: [place] Representative spoke with at [place]: [place] ([place]) Interventions     Readmission Risk Interventions Readmission Risk Prevention Plan  10/04/2018  Transportation Screening Complete  PCP or Specialist Appt within 5-7 Days Complete  Home Care Screening Complete  Medication Review (RN CM) Complete  Some recent data might be hidden

## 2018-10-12 ENCOUNTER — Encounter (HOSPITAL_COMMUNITY): Payer: Self-pay | Admitting: *Deleted

## 2018-10-12 ENCOUNTER — Emergency Department (HOSPITAL_COMMUNITY): Payer: BC Managed Care – PPO

## 2018-10-12 ENCOUNTER — Other Ambulatory Visit: Payer: Self-pay

## 2018-10-12 ENCOUNTER — Emergency Department (HOSPITAL_COMMUNITY)
Admission: EM | Admit: 2018-10-12 | Discharge: 2018-10-12 | Disposition: A | Discharge status: Home / Self Care | Payer: BC Managed Care – PPO | Attending: Emergency Medicine | Admitting: Emergency Medicine

## 2018-10-12 DIAGNOSIS — I10 Essential (primary) hypertension: Secondary | ICD-10-CM | POA: Diagnosis not present

## 2018-10-12 DIAGNOSIS — Z79899 Other long term (current) drug therapy: Secondary | ICD-10-CM | POA: Diagnosis not present

## 2018-10-12 DIAGNOSIS — R0602 Shortness of breath: Secondary | ICD-10-CM | POA: Diagnosis present

## 2018-10-12 DIAGNOSIS — H538 Other visual disturbances: Secondary | ICD-10-CM | POA: Insufficient documentation

## 2018-10-12 DIAGNOSIS — Z7982 Long term (current) use of aspirin: Secondary | ICD-10-CM | POA: Diagnosis not present

## 2018-10-12 DIAGNOSIS — Z794 Long term (current) use of insulin: Secondary | ICD-10-CM | POA: Insufficient documentation

## 2018-10-12 DIAGNOSIS — R072 Precordial pain: Secondary | ICD-10-CM | POA: Insufficient documentation

## 2018-10-12 DIAGNOSIS — E039 Hypothyroidism, unspecified: Secondary | ICD-10-CM | POA: Diagnosis not present

## 2018-10-12 DIAGNOSIS — E119 Type 2 diabetes mellitus without complications: Secondary | ICD-10-CM | POA: Insufficient documentation

## 2018-10-12 DIAGNOSIS — J90 Pleural effusion, not elsewhere classified: Secondary | ICD-10-CM | POA: Diagnosis not present

## 2018-10-12 DIAGNOSIS — J449 Chronic obstructive pulmonary disease, unspecified: Secondary | ICD-10-CM | POA: Insufficient documentation

## 2018-10-12 DIAGNOSIS — Z87891 Personal history of nicotine dependence: Secondary | ICD-10-CM | POA: Insufficient documentation

## 2018-10-12 LAB — BASIC METABOLIC PANEL
Anion gap: 8 (ref 5–15)
BUN: 8 mg/dL (ref 6–20)
CO2: 28 mmol/L (ref 22–32)
Calcium: 8.5 mg/dL — ABNORMAL LOW (ref 8.9–10.3)
Chloride: 105 mmol/L (ref 98–111)
Creatinine, Ser: 0.69 mg/dL (ref 0.44–1.00)
GFR calc Af Amer: >60 mL/min (ref >60)
GFR calc non Af Amer: >60 mL/min (ref >60)
Glucose, Bld: 97 mg/dL (ref 70–99)
Potassium: 4.2 mmol/L (ref 3.5–5.1)
Sodium: 141 mmol/L (ref 135–145)

## 2018-10-12 LAB — CBC
HCT: 33.4 % — ABNORMAL LOW (ref 36.0–46.0)
Hemoglobin: 10.4 g/dL — ABNORMAL LOW (ref 12.0–15.0)
MCH: 31.8 pg (ref 26.0–34.0)
MCHC: 31.1 g/dL (ref 30.0–36.0)
MCV: 102.1 fL — ABNORMAL HIGH (ref 80.0–100.0)
Platelets: 361 10*3/uL (ref 150–400)
RBC: 3.27 MIL/uL — ABNORMAL LOW (ref 3.87–5.11)
RDW: 14.5 % (ref 11.5–15.5)
WBC: 10 10*3/uL (ref 4.0–10.5)
nRBC: 0 % (ref 0.0–0.2)

## 2018-10-12 LAB — BRAIN NATRIURETIC PEPTIDE: B Natriuretic Peptide: 110 pg/mL — ABNORMAL HIGH (ref 0.0–100.0)

## 2018-10-12 LAB — TROPONIN I (HIGH SENSITIVITY)
Troponin I (High Sensitivity): 21 ng/L — ABNORMAL HIGH (ref <18)
Troponin I (High Sensitivity): 22 ng/L — ABNORMAL HIGH (ref <18)

## 2018-10-12 MED ORDER — IOHEXOL 350 MG/ML SOLN: 100.0000 mL | Freq: Once | INTRAVENOUS | Status: DC | PRN

## 2018-10-12 MED ORDER — HYDROCODONE-ACETAMINOPHEN 5-325 MG PO TABS
1.0000 | ORAL_TABLET | Freq: Once | ORAL | Status: AC
  Administered 2018-10-12: 21:00:00 1 via ORAL
  Filled 2018-10-12: qty 1

## 2018-10-12 MED ORDER — IOHEXOL 350 MG/ML SOLN
75.0000 mL | Freq: Once | INTRAVENOUS | Status: AC | PRN
  Administered 2018-10-12: 20:00:00 75 mL via INTRAVENOUS

## 2018-10-12 MED ORDER — FUROSEMIDE 20 MG PO TABS: 20.0000 mg | ORAL_TABLET | Freq: Every day | ORAL | 0 refills | Status: DC

## 2018-10-12 NOTE — ED Triage Notes (Signed)
States she had vascular surgery 2 weeks ago at [facility] she has been blurred vision since the surgery and now has shortness of breath onset yesterday. Advised by home health to come in for evaluation

## 2018-10-12 NOTE — ED Notes (Signed)
pt walked w/pulse ox, O2 did not fall below 95%, HR remained in high 90s. Pt denies pain. MD notified

## 2018-10-12 NOTE — ED Provider Notes (Signed)
[facility] EMERGENCY DEPARTMENT Provider Note   CSN: [id number] Arrival date & time: 10/12/18  1554    History   Chief Complaint Chief Complaint  Patient presents with   Shortness of Breath   Blurred Vision    HPI Alexandria Webster is a 53 y.o. female.  HPI: A 53 year old patient with a history of peripheral artery disease, treated diabetes, hypertension and hypercholesterolemia presents for evaluation of chest pain. Initial onset of pain was less than one hour ago. The patient's chest pain is described as heaviness/pressure/tightness and is not worse with exertion. The patient's chest pain is not middle- or left-sided, is not well-localized, is not sharp and does not radiate to the arms/jaw/neck. The patient does not complain of nausea and denies diaphoresis. The patient has no history of stroke, has not smoked in the past 90 days, has no relevant family history of coronary artery disease (first degree relative at less than age 76) and does not have an elevated BMI (>=30).   Patient referred in by home health nurse.  Patient was in the hospital July 16 through July 23 for redo femoropopliteal procedure.  Patient had the operation on the 16th.  Patient did experience bilateral leg swelling while in the hospital.  That has continued.  Patient is felt short of breath had a little bit of wheezing patient does have a history of COPD.  Apparently blood pressure was elevated today as well.  Patient states that there is some blurred vision but that was prior to the surgery.  It is no worse.  She has felt short of breath since yesterday.  Patient did have a 30-minute episode of chest pain this morning.  None since.  Patient able to walk fine at home.  Just has the increased leg swelling.  Past medical history significant for COPD peripheral vascular disease hypertension diabetes high cholesterol.  And patient was a smoker until January of this year.  Patient feels her surgical wounds are healing  well.     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   Gastritis 10/01/2015   Gastroparesis due to DM ([place]) 10/01/2015   Personal history of noncompliance with medical treatment, presenting hazards to health 09/14/2015   Hypokalemia 11/13/2012   Unspecified constipation 11/09/2012   Facial cellulitis 11/07/2012   Leukocytosis 11/07/2012   Type 2 diabetes mellitus, uncontrolled ([place]) 11/07/2012   Asthma 11/07/2012   PVD (peripheral vascular disease) ([place]) 11/07/2012   Hyponatremia 11/07/2012   Tachycardia 11/07/2012   Nausea & vomiting 11/07/2012   Essential hypertension, benign     Past Surgical History:  Procedure Laterality Date   AORTA - BILATERAL FEMORAL ARTERY BYPASS GRAFT N/A 09/27/2018   Procedure: Redo Exposure  AORTOBIFEMORAL Bypass and bilateral femoral Artery ; Redo Aortobifemoral  BYPASS GRAFT.;  Surgeon: Marty Heck, MD;  Location: [facility];  Service: Vascular;  Laterality: N/A;   BACK  SURGERY     CORONARY ANGIOPLASTY WITH STENT PLACEMENT     CORONARY ARTERY BYPASS GRAFT     FEMORAL ARTERY STENT  right leg   TUBAL LIGATION       OB History    Gravida  2   Para  1   Term      Preterm  1   AB  1   Living  1     SAB  1   TAB      Ectopic      Multiple      Live Births  1            Home Medications    Prior to Admission medications   Medication Sig  Start Date End Date Taking? Authorizing Provider  aspirin EC 81 MG tablet Take 81 mg by mouth every morning.    Yes [provider]  budesonide-formoterol (SYMBICORT) 160-4.5 MCG/ACT inhaler Inhale 2 puffs into the lungs 2 (two) times daily.   Yes [provider]  Cholecalciferol (VITAMIN D3) 1000 units CAPS Take 1,000 Units by mouth 2 (two) times a day.    Yes [provider]  esomeprazole (NEXIUM) 20 MG capsule Take 20 mg by mouth daily.    Yes [provider]  GARLIC PO Take 1 tablet by mouth daily.   Yes [provider]  HYDROcodone-acetaminophen (NORCO/VICODIN) 5-325 MG tablet Take 1 tablet by mouth every 6 (six) hours as needed. 10/04/18  Yes Rhyne, Samantha J, PA-C  insulin aspart (NOVOLOG FLEXPEN) 100 UNIT/ML FlexPen Inject 10-16 Units into the skin 3 (three) times daily with meals. Patient taking differently: Inject 20 Units into the skin Nightly.  10/01/15  Yes Nida, Marella Chimes, MD  Insulin Glargine (BASAGLAR KWIKPEN) 100 UNIT/ML SOPN Inject 14-16 Units into the skin See admin instructions. Inject sliding scale three times daily with 14 units being the lowest and 16 units being the highest. 10/10/18  Yes [provider]  JANUMET XR [id number] MG TB24 Take 1 tablet by mouth daily.  04/30/16  Yes [provider]  levothyroxine (SYNTHROID, LEVOTHROID) 25 MCG tablet Take 25 mcg by mouth daily before breakfast.   Yes [provider]  lovastatin (MEVACOR) 40 MG tablet Take 40 mg by mouth at bedtime.  10/10/18  Yes [provider]  metoCLOPramide (REGLAN) 5 MG tablet Take 1 tablet (5 mg total) by mouth 4 (four) times daily -  before meals and at bedtime. Patient taking differently: Take 5 mg by mouth 4 (four) times daily.  10/05/15  Yes Donne Hazel, MD  Multiple Vitamin (MULTIVITAMIN WITH MINERALS) TABS tablet Take 1 tablet by mouth daily.   Yes [provider]  polyethylene glycol (MIRALAX / GLYCOLAX)  packet Take 17 g by mouth daily. Patient taking differently: Take 17 g by mouth daily as needed for moderate constipation.  10/05/15  Yes Donne Hazel, MD  glucose blood (ACCU-CHEK AVIVA) test strip Test glucose 4 times a day Patient not taking: Reported on 10/12/2018 09/28/15   Cassandria Anger, MD  Insulin Pen Needle (B-D UF III MINI PEN NEEDLES) 31G X 5 MM MISC Use qhs Patient not taking: Reported on 10/12/2018 09/17/15   Cassandria Anger, MD  lovastatin (MEVACOR) 20 MG tablet Take 20 mg by mouth at bedtime.    [provider]    Family History Family History  Problem Relation Age of Onset   Stroke Mother    Hypertension Mother    Heart  failure Father    Asthma Father    Diabetes Father    Heart disease Father    Emphysema Paternal Grandfather     Social History Social History   Tobacco Use   Smoking status: Former Smoker    Packs/day: 0.00    Years: 36.00    Pack years: 0.00    Types: Cigarettes    Quit date: 03/14/2018    Years since quitting: 0.5   Smokeless tobacco: Never Used  Substance Use Topics   Alcohol use: No   Drug use: No     Allergies   Patient has no known allergies.   Review of Systems Review of Systems  Constitutional: Negative for chills and fever.  HENT: Negative for congestion, ear pain, rhinorrhea and sore throat.   Eyes: Negative for pain and visual disturbance.  Respiratory: Positive for shortness of breath. Negative for cough.   Cardiovascular: Positive for chest pain and leg swelling. Negative for palpitations.  Gastrointestinal: Negative for abdominal pain, diarrhea, nausea and vomiting.  Genitourinary: Negative for dysuria and hematuria.  Musculoskeletal: Negative for arthralgias, back pain and neck pain.  Skin: Negative for color change and rash.  Neurological: Negative for dizziness, seizures, syncope, light-headedness and headaches.  Hematological: Does not bruise/bleed easily.  Psychiatric/Behavioral:  Negative for confusion.  All other systems reviewed and are negative.    Physical Exam Updated Vital Signs BP (!) 179/89    Pulse 93    Temp 98.1 F (36.7 C) (Oral)    Resp 19    Ht 1.499 m (4\' 11" )    LMP 01/21/2012    SpO2 98%    BMI 31.17 kg/m   Physical Exam Vitals signs and nursing note reviewed.  Constitutional:      General: She is not in acute distress.    Appearance: She is well-developed.  HENT:     Head: Normocephalic and atraumatic.  Eyes:     Extraocular Movements: Extraocular movements intact.     Conjunctiva/sclera: Conjunctivae normal.     Pupils: Pupils are equal, round, and reactive to light.  Neck:     Musculoskeletal: Normal range of motion and neck supple.  Cardiovascular:     Rate and Rhythm: Normal rate and regular rhythm.     Heart sounds: No murmur.  Pulmonary:     Effort: Pulmonary effort is normal. No respiratory distress.     Breath sounds: Normal breath sounds.  Abdominal:     General: Bowel sounds are normal.     Palpations: Abdomen is soft.     Tenderness: There is no abdominal tenderness.     Comments: Vascular surgery wounds all healing well.  Bilateral groin and abdomen.  Musculoskeletal:     Right lower leg: Edema present.     Left lower leg: Edema present.     Comments: Marked edema to both lower extremities without any erythema.  Skin:    General: Skin is warm and dry.  Neurological:     Mental Status: She is alert.      ED Treatments / Results  Labs (all labs ordered are listed, but only abnormal results are displayed) Labs Reviewed  CBC - Abnormal; Notable for the following components:      Result Value   RBC 3.27 (*)    Hemoglobin 10.4 (*)    HCT 33.4 (*)    MCV 102.1 (*)    All other components within normal limits  BASIC METABOLIC PANEL - Abnormal; Notable for the following components:  Calcium 8.5 (*)    All other components within normal limits  BRAIN NATRIURETIC PEPTIDE - Abnormal; Notable for the following  components:   B Natriuretic Peptide 110.0 (*)    All other components within normal limits  TROPONIN I (HIGH SENSITIVITY) - Abnormal; Notable for the following components:   Troponin I (High Sensitivity) 21 (*)    All other components within normal limits  TROPONIN I (HIGH SENSITIVITY) - Abnormal; Notable for the following components:   Troponin I (High Sensitivity) 22 (*)    All other components within normal limits    EKG EKG Interpretation  Date/Time:  Friday October 12 2018 16:16:32 EDT Ventricular Rate:  87 PR Interval:    QRS Duration: 90 QT Interval:  344 QTC Calculation: 414 R Axis:   75 Text Interpretation:  Sinus rhythm Low voltage, precordial leads Confirmed by Fredia Sorrow [phone]) on 10/12/2018 5:32:03 PM   Radiology Ct Angio Chest Pe W/cm &/or Wo Cm  Result Date: 10/12/2018 CLINICAL DATA:  Vascular surgery 2 weeks ago with shortness of breath. Assess for pulmonary embolus. EXAM: CT ANGIOGRAPHY CHEST WITH CONTRAST TECHNIQUE: Multidetector CT imaging of the chest was performed using the standard protocol during bolus administration of intravenous contrast. Multiplanar CT image reconstructions and MIPs were obtained to evaluate the vascular anatomy. CONTRAST:  73mL OMNIPAQUE IOHEXOL 350 MG/ML SOLN COMPARISON:  CT chest September 04, 2015 FINDINGS: Cardiovascular: Satisfactory opacification of the pulmonary arteries to the segmental level. No evidence of pulmonary embolism. Normal heart size. No pericardial effusion. Mediastinum/Nodes: No enlarged mediastinal, hilar, or axillary lymph nodes. Thyroid gland, trachea, and esophagus demonstrate no significant findings. Lungs/Pleura: There are moderate bilateral pleural effusions with compression atelectasis the bilateral posterior lower lobes. No focal pneumonia is identified. Upper Abdomen: No acute abnormalities identified. Chronic hypertrophy of adrenal glands are unchanged. Musculoskeletal: Degenerative joint changes of the spine are  noted. Review of the MIP images confirms the above findings. IMPRESSION: No pulmonary embolus Moderate bilateral pleural effusions with compression atelectasis of the bilateral posterior lower lobes. Electronically Signed   By: Abelardo Diesel M.D.   On: 10/12/2018 19:48   Dg Chest Port 1 View  Result Date: 10/12/2018 CLINICAL DATA:  Shortness of breath and chest pain. Nonproductive cough. EXAM: PORTABLE CHEST 1 VIEW COMPARISON:  09/28/2018 FINDINGS: Heart size is normal. Aortic atherosclerosis. There are bilateral pleural effusions with dependent atelectasis. Findings may relate to congestive heart failure/fluid overload. Basilar pneumonia not excluded but not favored. No acute bone finding. IMPRESSION: Bilateral effusions with lower lung atelectasis. 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 Electronically Signed   By: Nelson Chimes M.D.   On: 10/12/2018 17:19    Procedures Procedures (including critical care time)  Medications Ordered in ED Medications  iohexol (OMNIPAQUE) 350 MG/ML injection 100 mL (has no administration in time range)  iohexol (OMNIPAQUE) 350 MG/ML injection 75 mL (75 mLs Intravenous Contrast Given 10/12/18 1935)  HYDROcodone-acetaminophen (NORCO/VICODIN) 5-325 MG per tablet 1 tablet (1 tablet Oral Given 10/12/18 2050)     Initial Impression / Assessment and Plan / ED Course  I have reviewed the triage vital signs and the nursing notes.  Pertinent labs & imaging results that were available during my care of the patient were reviewed by me and considered in my medical decision making (see chart for details).     HEAR Score: 3  Patient's heart pathway work-up gave her heart score of 3.  Patient had 30 minutes of chest pain earlier in the day.  But  her main concern was the shortness of breath.  And we do have the diagnosis of pleural effusions.  Patient able to ambulate fine without any shortness of breath or any decrease in oxygen saturation.  Patient did have 2  elevated troponins 22 but repeat was actually lower at 21.  But no delta.  These both were greater than 18.  But feel that patient's main problem is the pleural effusions.  Will be starting her on Lasix and close follow-up with her doctor Dr. Maudie Mercury.  She will return for any new or worse symptoms.  Patient also have bilateral leg swelling this been ongoing since her vascular procedure.  Her vascular wounds are healing well.  No evidence of any significant pulmonary edema in the lungs.  Patient really wanted to go home if at all possible.  Patient has not had any chest discomfort or chest pain for many hours.   Final Clinical Impressions(s) / ED Diagnoses   Final diagnoses:  SOB (shortness of breath)  Pleural effusion  Precordial pain    ED Discharge Orders    None       Fredia Sorrow, MD 10/13/18 [phone]

## 2018-10-12 NOTE — ED Notes (Signed)
Pt ambulatory to the restroom without diff.

## 2018-10-12 NOTE — Discharge Instructions (Signed)
Work-up here tonight shows bilateral pleural effusions.  Take the Lasix as directed.  Make an appointment to follow-up with your regular doctor.  Return for any new or worse symptoms.  Return for the development of any chest pain.

## 2018-10-15 ENCOUNTER — Telehealth: Payer: Self-pay | Admitting: Vascular Surgery

## 2018-10-15 NOTE — Telephone Encounter (Signed)
Kat, OT with [facility] callled.  She wants to see patient 2 times a week for 2 weeks, then 1 time a week for 1-2 weeks after that.    She also noticed patient had elevated BP yesterday, 166/82 and 180/104.    I advised her that additional Occupational Therapy ok.  We will give the faxed order to Dr. Carlis Abbott for his signature.  Also, patient has an appointment to see Dr. Carlis Abbott tomorrow will address her BP at that time.    Thurston Hole., LPN

## 2018-10-16 ENCOUNTER — Other Ambulatory Visit: Payer: Self-pay

## 2018-10-16 ENCOUNTER — Ambulatory Visit (INDEPENDENT_AMBULATORY_CARE_PROVIDER_SITE_OTHER): Payer: Self-pay | Admitting: Vascular Surgery

## 2018-10-16 ENCOUNTER — Encounter: Payer: Self-pay | Admitting: Vascular Surgery

## 2018-10-16 VITALS — BP 134/73 | HR 86 | Temp 98.4°F | Resp 20 | Ht 59.0 in | Wt 150.0 lb

## 2018-10-16 DIAGNOSIS — T82868A Thrombosis of vascular prosthetic devices, implants and grafts, initial encounter: Secondary | ICD-10-CM

## 2018-10-16 MED ORDER — HYDROCODONE-ACETAMINOPHEN 5-325 MG PO TABS: 1.0000 | ORAL_TABLET | Freq: Four times a day (QID) | ORAL | 0 refills | Status: DC | PRN

## 2018-10-16 NOTE — Progress Notes (Signed)
Patient name: Alexandria Webster MRN: [id number] DOB: 03/30/1965 Sex: female  REASON FOR VISIT: Post-op check s/p re-do aortobifem  HPI: Alexandria Webster is a 53 y.o. female who presents for postop check after redo aortobifem on 09/27/18 for short distance lifestyle limiting claudication and early rest pain symptoms.  She had an occluded aortobifemoral graft done by Dr. Kellie Simmering years ago.  She did remarkably well postop and was discharged home without any issues.  Her main concern has been leg swelling.  She states she went to an ED in [facility] and was given 5 days of Lasix and all of her leg swelling is improved.  She is eating without issue.  Her legs feel much better.  No issues with wound healing.  Past Medical History:  Diagnosis Date  . Asthma   . COPD (chronic obstructive pulmonary disease) ([place])   . DDD (degenerative disc disease), lumbar   . Diabetes mellitus   . Gastroparesis   . GERD without esophagitis   . High cholesterol   . Hypertension   . Hypothyroidism   . PONV (postoperative nausea and vomiting)   . PVD (peripheral vascular disease) ([place])   . Wears glasses     Past Surgical History:  Procedure Laterality Date  . AORTA - BILATERAL FEMORAL ARTERY BYPASS GRAFT N/A 09/27/2018   Procedure: Redo Exposure  AORTOBIFEMORAL Bypass and bilateral femoral Artery ; Redo Aortobifemoral  BYPASS GRAFT.;  Surgeon: Marty Heck, MD;  Location: [facility];  Service: Vascular;  Laterality: N/A;  . BACK SURGERY    . CORONARY ANGIOPLASTY WITH STENT PLACEMENT    . CORONARY ARTERY BYPASS GRAFT    . FEMORAL ARTERY STENT  right leg  . TUBAL LIGATION      Family History  Problem Relation Age of Onset  . Stroke Mother   . Hypertension Mother   . Heart failure Father   . Asthma Father   . Diabetes Father   . Heart disease Father   . Emphysema Paternal Grandfather     SOCIAL HISTORY: Social History   Tobacco Use  . Smoking status: Former Smoker    Packs/day: 0.00   Years: 36.00    Pack years: 0.00    Types: Cigarettes    Quit date: 03/14/2018    Years since quitting: 0.5  . Smokeless tobacco: Never Used  Substance Use Topics  . Alcohol use: No    No Known Allergies  Current Outpatient Medications  Medication Sig Dispense Refill  . aspirin EC 81 MG tablet Take 81 mg by mouth every morning.     . budesonide-formoterol (SYMBICORT) 160-4.5 MCG/ACT inhaler Inhale 2 puffs into the lungs 2 (two) times daily.    . Cholecalciferol (VITAMIN D3) 1000 units CAPS Take 1,000 Units by mouth 2 (two) times a day.     . esomeprazole (NEXIUM) 20 MG capsule Take 20 mg by mouth daily.     . furosemide (LASIX) 20 MG tablet Take 1 tablet (20 mg total) by mouth daily. 7 tablet 0  . GARLIC PO Take 1 tablet by mouth daily.    Marland Kitchen glucose blood (ACCU-CHEK AVIVA) test strip Test glucose 4 times a day 150 each 3  . HYDROcodone-acetaminophen (NORCO/VICODIN) 5-325 MG tablet Take 1 tablet by mouth every 6 (six) hours as needed. 30 tablet 0  . insulin aspart (NOVOLOG FLEXPEN) 100 UNIT/ML FlexPen Inject 10-16 Units into the skin 3 (three) times daily with meals. (Patient taking differently: Inject 20 Units into the skin  Nightly. ) 15 mL 2  . Insulin Glargine (BASAGLAR KWIKPEN) 100 UNIT/ML SOPN Inject 14-16 Units into the skin See admin instructions. Inject sliding scale three times daily with 14 units being the lowest and 16 units being the highest.    . Insulin Pen Needle (B-D UF III MINI PEN NEEDLES) 31G X 5 MM MISC Use qhs 100 each 5  . JANUMET XR [phone] MG TB24 Take 1 tablet by mouth daily.     Marland Kitchen levothyroxine (SYNTHROID, LEVOTHROID) 25 MCG tablet Take 25 mcg by mouth daily before breakfast.    . lovastatin (MEVACOR) 20 MG tablet Take 20 mg by mouth at bedtime.    . lovastatin (MEVACOR) 40 MG tablet Take 40 mg by mouth at bedtime.     . metoCLOPramide (REGLAN) 5 MG tablet Take 1 tablet (5 mg total) by mouth 4 (four) times daily -  before meals and at bedtime. (Patient taking  differently: Take 5 mg by mouth 4 (four) times daily. ) 90 tablet 0  . Multiple Vitamin (MULTIVITAMIN WITH MINERALS) TABS tablet Take 1 tablet by mouth daily.    . polyethylene glycol (MIRALAX / GLYCOLAX) packet Take 17 g by mouth daily. (Patient taking differently: Take 17 g by mouth daily as needed for moderate constipation. ) 14 each 0   No current facility-administered medications for this visit.     REVIEW OF SYSTEMS:  [X]  denotes positive finding, [ ]  denotes negative finding Cardiac  Comments:  Chest pain or chest pressure:    Shortness of breath upon exertion:    Short of breath when lying flat:    Irregular heart rhythm:        Vascular    Pain in calf, thigh, or hip brought on by ambulation:    Pain in feet at night that wakes you up from your sleep:     Blood clot in your veins:    Leg swelling:         Pulmonary    Oxygen at home:    Productive cough:     Wheezing:         Neurologic    Sudden weakness in arms or legs:     Sudden numbness in arms or legs:     Sudden onset of difficulty speaking or slurred speech:    Temporary loss of vision in one eye:     Problems with dizziness:         Gastrointestinal    Blood in stool:     Vomited blood:         Genitourinary    Burning when urinating:     Blood in urine:        Psychiatric    Major depression:         Hematologic    Bleeding problems:    Problems with blood clotting too easily:        Skin    Rashes or ulcers:        Constitutional    Fever or chills:      PHYSICAL EXAM: Vitals:   10/16/18 1325  BP: 134/73  Pulse: 86  Resp: 20  Temp: 98.4 F (36.9 C)  SpO2: 98%  Weight: 150 lb (68 kg)  Height: 4\' 11"  (1.499 m)    GENERAL: The patient is a well-nourished female, in no acute distress. The vital signs are documented above. CARDIAC: There is a regular rate and rhythm.  VASCULAR:  2+ palpable femoral pulse bilateral Bilateral groin  incisions healing without issue, no drainage  Midline abdominal incision healing Brisk DP/PT signals bilaterally  DATA:   None  Assessment/Plan:  53 year old female status post redo aortobifem on 09/27/18 for bilateral lower extremity short distance claudication and early rest pain symptoms.  She had an occluded aortobifem performed by Dr. Kellie Simmering years ago.  She is doing remarkably well today and her groin incisions as well as her midline incision have healed without issue.  She seems to be tolerating regular food.  Her legs look much better on lasix and no appreciable swelling today.  She has very brisk posterior tibial and dorsalis pedis signals on exam.  Overall she feels much better.  I will plan to see her back in 6 months with ABIs.  I did send one more prescription of pain medicine Norco to her pharmacy.  Discussed that she contact us if she has any issues in the meantime.   Marty Heck, MD [facility] Office: [phone] Pager: [phone]

## 2018-10-29 ENCOUNTER — Other Ambulatory Visit: Payer: Self-pay | Admitting: Vascular Surgery

## 2018-10-30 ENCOUNTER — Encounter: Payer: BC Managed Care – PPO | Admitting: Vascular Surgery

## 2018-12-07 ENCOUNTER — Telehealth: Payer: Self-pay

## 2018-12-07 NOTE — Telephone Encounter (Signed)
Pt called and said that she had surgery in July and she is still having issues with pain and swelling even though she has followed instructions with elevation. She said that it is just getting unbearable to walk.   Appt made for pt to be seen and be evaluated. Advised her if her symptoms got worse before he rappt to go to the Er.   York Cerise, CMA

## 2018-12-12 ENCOUNTER — Ambulatory Visit (INDEPENDENT_AMBULATORY_CARE_PROVIDER_SITE_OTHER): Payer: BC Managed Care – PPO | Admitting: Family

## 2018-12-12 ENCOUNTER — Other Ambulatory Visit: Payer: Self-pay

## 2018-12-12 ENCOUNTER — Encounter: Payer: Self-pay | Admitting: Family

## 2018-12-12 VITALS — BP 127/80 | HR 74 | Temp 97.9°F | Resp 14 | Ht 59.0 in | Wt 132.1 lb

## 2018-12-12 DIAGNOSIS — Z95828 Presence of other vascular implants and grafts: Secondary | ICD-10-CM

## 2018-12-12 DIAGNOSIS — IMO0002 Reserved for concepts with insufficient information to code with codable children: Secondary | ICD-10-CM

## 2018-12-12 DIAGNOSIS — E1165 Type 2 diabetes mellitus with hyperglycemia: Secondary | ICD-10-CM

## 2018-12-12 DIAGNOSIS — Z87891 Personal history of nicotine dependence: Secondary | ICD-10-CM

## 2018-12-12 DIAGNOSIS — T82868A Thrombosis of vascular prosthetic devices, implants and grafts, initial encounter: Secondary | ICD-10-CM

## 2018-12-12 DIAGNOSIS — E1151 Type 2 diabetes mellitus with diabetic peripheral angiopathy without gangrene: Secondary | ICD-10-CM

## 2018-12-12 NOTE — Patient Instructions (Signed)
Peripheral Vascular Disease  Peripheral vascular disease (PVD) is a disease of the blood vessels that are not part of your heart and brain. A simple term for PVD is poor circulation. In most cases, PVD narrows the blood vessels that carry blood from your heart to the rest of your body. This can reduce the supply of blood to your arms, legs, and internal organs, like your stomach or kidneys. However, PVD most often affects a person's lower legs and feet. Without treatment, PVD tends to get worse. PVD can also lead to acute ischemic limb. This is when an arm or leg suddenly cannot get enough blood. This is a medical emergency. Follow these instructions at home: Lifestyle  Do not use any products that contain nicotine or tobacco, such as cigarettes and e-cigarettes. If you need help quitting, ask your doctor.  Lose weight if you are overweight. Or, stay at a healthy weight as told by your doctor.  Eat a diet that is low in fat and cholesterol. If you need help, ask your doctor.  Exercise regularly. Ask your doctor for activities that are right for you. General instructions  Take over-the-counter and prescription medicines only as told by your doctor.  Take good care of your feet: ? Wear comfortable shoes that fit well. ? Check your feet often for any cuts or sores.  Keep all follow-up visits as told by your doctor This is important. Contact a doctor if:  You have cramps in your legs when you walk.  You have leg pain when you are at rest.  You have coldness in a leg or foot.  Your skin changes.  You are unable to get or have an erection (erectile dysfunction).  You have cuts or sores on your feet that do not heal. Get help right away if:  Your arm or leg turns cold, numb, and blue.  Your arms or legs become red, warm, swollen, painful, or numb.  You have chest pain.  You have trouble breathing.  You suddenly have weakness in your face, arm, or leg.  You become very  confused or you cannot speak.  You suddenly have a very bad headache.  You suddenly cannot see. Summary  Peripheral vascular disease (PVD) is a disease of the blood vessels.  A simple term for PVD is poor circulation. Without treatment, PVD tends to get worse.  Treatment may include exercise, low fat and low cholesterol diet, and quitting smoking. This information is not intended to replace advice given to you by your health care provider. Make sure you discuss any questions you have with your health care provider. Document Released: 05/25/2009 Document Revised: 02/10/2017 Document Reviewed: 04/07/2016 Elsevier Patient Education  2020 Elsevier Inc.  

## 2018-12-12 NOTE — Progress Notes (Signed)
[facility] [place] [facility]   CC: left hip to heel pain that suddenly started 2-3 days ago, sometimes present when supine, mostly present when walking, history of peripheral artery occlusive disease  History of Present Illness Alexandria Webster is a 53 y.o. female who is s/p redo aortobifem on 09/27/18 by Dr. Carlis Abbott for short distance lifestyle limiting claudication and early rest pain symptoms.  She had an occluded aortobifemoral graft done by Dr. Kellie Simmering years ago.  She did remarkably well postop and was discharged home without any issues.  Her main concern has been leg swelling.  She states she went to an ED in [facility] and was given 5 days of Lasix and all of her leg swelling had improved.  She is eating without issue.    Dr. Carlis Abbott last evaluated pt on 10-16-18. At that time she was doing remarkably well and her groin incisions as well as her midline incision had healed. She seemed to be tolerating regular food.  Her legs looked much better on lasix and no appreciable swelling.  She had very brisk posterior tibial and dorsalis pedis signals on exam.  Overall she felt much better. Pt was to return in 6 months with ABIs.  One more prescription of pain medicine Norco was sent to her pharmacy.  Discussed that she contact us if she has any issues in the meantime.  She returns today with c/o left hip to heel pain that suddenly started 2-3 days ago, sometimes present when supine, mostly present when walking.   Diabetic: Yes, A1C was 11.5 on 09-24-18, severely uncontrolled  Tobacco use: former smoker, quit in January 2020, smoked x 36 years   Pt meds include: Statin :Yes Betablocker: No ASA: Yes Other anticoagulants/antiplatelets: no  Past Medical History:  Diagnosis Date  . Asthma   . COPD (chronic obstructive pulmonary disease) ([place])   . DDD (degenerative disc disease), lumbar   . Diabetes mellitus   . Gastroparesis   . GERD without esophagitis   . High cholesterol   .  Hypertension   . Hypothyroidism   . PONV (postoperative nausea and vomiting)   . PVD (peripheral vascular disease) ([place])   . Wears glasses     Social History Social History   Tobacco Use  . Smoking status: Former Smoker    Packs/day: 0.00    Years: 36.00    Pack years: 0.00    Types: Cigarettes    Quit date: 03/14/2018    Years since quitting: 0.7  . Smokeless tobacco: Never Used  Substance Use Topics  . Alcohol use: No  . Drug use: No    Family History Family History  Problem Relation Age of Onset  . Stroke Mother   . Hypertension Mother   . Heart failure Father   . Asthma Father   . Diabetes Father   . Heart disease Father   . Emphysema Paternal Grandfather     Past Surgical History:  Procedure Laterality Date  . AORTA - BILATERAL FEMORAL ARTERY BYPASS GRAFT N/A 09/27/2018   Procedure: Redo Exposure  AORTOBIFEMORAL Bypass and bilateral femoral Artery ; Redo Aortobifemoral  BYPASS GRAFT.;  Surgeon: Marty Heck, MD;  Location: [place];  Service: Vascular;  Laterality: N/A;  . BACK SURGERY    . CORONARY ANGIOPLASTY WITH STENT PLACEMENT    . CORONARY ARTERY BYPASS GRAFT    . FEMORAL ARTERY STENT  right leg  . TUBAL LIGATION      No Known Allergies  Current Outpatient Medications  Medication Sig Dispense Refill  . aspirin EC 81 MG tablet Take 81 mg by mouth every morning.     . budesonide-formoterol (SYMBICORT) 160-4.5 MCG/ACT inhaler Inhale 2 puffs into the lungs 2 (two) times daily.    . Cholecalciferol (VITAMIN D3) 1000 units CAPS Take 1,000 Units by mouth 2 (two) times a day.     . esomeprazole (NEXIUM) 20 MG capsule Take 20 mg by mouth daily.     . furosemide (LASIX) 20 MG tablet Take 1 tablet (20 mg total) by mouth daily. 7 tablet 0  . GARLIC PO Take 1 tablet by mouth daily.    Marland Kitchen glucose blood (ACCU-CHEK AVIVA) test strip Test glucose 4 times a day 150 each 3  . HYDROcodone-acetaminophen (NORCO/VICODIN) 5-325 MG tablet Take 1 tablet by mouth every 6  (six) hours as needed. 30 tablet 0  . insulin aspart (NOVOLOG FLEXPEN) 100 UNIT/ML FlexPen Inject 10-16 Units into the skin 3 (three) times daily with meals. (Patient taking differently: Inject 20 Units into the skin Nightly. ) 15 mL 2  . Insulin Glargine (BASAGLAR KWIKPEN) 100 UNIT/ML SOPN Inject 14-16 Units into the skin See admin instructions. Inject sliding scale three times daily with 14 units being the lowest and 16 units being the highest.    . Insulin Pen Needle (B-D UF III MINI PEN NEEDLES) 31G X 5 MM MISC Use qhs 100 each 5  . JANUMET XR [id number] MG TB24 Take 1 tablet by mouth daily.     Marland Kitchen levothyroxine (SYNTHROID, LEVOTHROID) 25 MCG tablet Take 25 mcg by mouth daily before breakfast.    . lovastatin (MEVACOR) 20 MG tablet Take 20 mg by mouth at bedtime.    . lovastatin (MEVACOR) 40 MG tablet Take 40 mg by mouth at bedtime.     . metoCLOPramide (REGLAN) 5 MG tablet Take 1 tablet (5 mg total) by mouth 4 (four) times daily -  before meals and at bedtime. (Patient taking differently: Take 5 mg by mouth 4 (four) times daily. ) 90 tablet 0  . Multiple Vitamin (MULTIVITAMIN WITH MINERALS) TABS tablet Take 1 tablet by mouth daily.    . polyethylene glycol (MIRALAX / GLYCOLAX) packet Take 17 g by mouth daily. (Patient taking differently: Take 17 g by mouth daily as needed for moderate constipation. ) 14 each 0   No current facility-administered medications for this visit.     ROS: See HPI for pertinent positives and negatives.   Physical Examination  Vitals:   12/12/18 1041  BP: 127/80  Pulse: 74  Resp: 14  Temp: 97.9 F (36.6 C)  TempSrc: Temporal  SpO2: 98%  Weight: 132 lb 1.6 oz (59.9 kg)  Height: 4\' 11"  (1.499 m)   Body mass index is 26.68 kg/m.  General: A&O x 3, WDWN, female in NAD. Gait: normal HEENT: No gross abnormalities.  Pulmonary: Respirations are non labored, CTAB, good air movement in all fields Cardiac: regular rhythm, no detected murmur.         Carotid  Bruits Right Left   Negative Negative   Radial pulses are not palpable bilaterally, bilateral brachial pulses are 2+ palpable (pt denies tingling or numbness in her upper extremities)  Adominal aortic pulse is not palpable                         VASCULAR EXAM: Extremities without ischemic changes, without Gangrene; without open wounds. Feet and all toes are pink and  warm with brisk capillary refill                                                                                                          LE Pulses Right Left       FEMORAL  2+ palpable  2+ palpable        POPLITEAL  not palpable   not palpable       POSTERIOR TIBIAL  not palpable, + brisk biphasic Doppler signal   not palpable, + brisk biphasic Doppler signal        DORSALIS PEDIS      ANTERIOR TIBIAL not palpable, + monophasic Doppler signal  not palpable, + monophasic Doppler signal    Abdomen: soft, NT, no palpable masses. Skin: no rashes, no cellulitis, no ulcers noted. Musculoskeletal: no muscle wasting or atrophy.  Neurologic: A&O X 3; appropriate affect, Sensation is normal; MOTOR FUNCTION:  moving all extremities equally, motor strength 5/5 throughout. Speech is fluent/normal. CN 2-12 intact. Left leg raise against resistance elicits pain in her posterior left thigh  Psychiatric: Thought content is normal, mood appropriate for clinical situation.     ASSESSMENT: Alexandria Webster is a 53 y.o. female who is s/p redo aortobifem on 09/27/18 by Dr. Carlis Abbott for short distance lifestyle limiting claudication and early rest pain symptoms.  She had an occluded aortobifemoral graft done by Dr. Kellie Simmering years ago.   Her atherosclerotic risk factors include severely uncontrolled DM, last A1C result on file was 11.5 on 09-24-18. She states that she is working closely with her PCP to get her DM in better control.  She is also a former smoker.  There are brisk biphasic Doppler signals in bilateral PT, monophasic signals in  bilateral DP. Her feet and all toes are warm and pink with brisk capillary refill.   Abdominal and bilateral groin incisions are well healed.   She is having left hip to heel pain that suddenly started 2-3 days ago, sometimes present when supine, mostly present when walking. These symptoms are consistent with arthritis and/or sciatica.  I advised her to seek advice from her PCP re this.   DATA  None since CTA abd/pelvis on 08-09-18    PLAN:  Based on the patient's HPI and examination, pt will return to clinic in early February 2021 wit ABI's, and see Dr. Carlis Abbott, NP, or PA.  I encouraged her to walk as much as possible in a safe envirnment, stop and rest when she needs to.   I discussed in depth with the patient the nature of atherosclerosis, and emphasized the importance of maximal medical management including strict control of blood pressure, blood glucose, and lipid levels, obtaining regular exercise, and continued cessation of smoking.  The patient is aware that without maximal medical management the underlying atherosclerotic disease process will progress, limiting the benefit of any interventions.  The patient was given information about PAD including signs, symptoms, treatment, what symptoms should prompt the patient to seek immediate medical care, and risk reduction measures to take.  Clemon Chambers, RN, MSN,  FNP-C [facility] Phone: [phone]  Clinic MD: Scot Dock  12/12/18 10:44 AM

## 2019-11-28 ENCOUNTER — Encounter: Payer: Self-pay | Admitting: Emergency Medicine

## 2019-11-28 ENCOUNTER — Other Ambulatory Visit: Payer: Self-pay

## 2019-11-28 ENCOUNTER — Ambulatory Visit
Admission: EM | Admit: 2019-11-28 | Discharge: 2019-11-28 | Disposition: A | Discharge status: Home / Self Care | Payer: BC Managed Care – PPO | Attending: Emergency Medicine | Admitting: Emergency Medicine

## 2019-11-28 DIAGNOSIS — H6592 Unspecified nonsuppurative otitis media, left ear: Secondary | ICD-10-CM

## 2019-11-28 DIAGNOSIS — J028 Acute pharyngitis due to other specified organisms: Secondary | ICD-10-CM | POA: Diagnosis not present

## 2019-11-28 DIAGNOSIS — B9789 Other viral agents as the cause of diseases classified elsewhere: Secondary | ICD-10-CM

## 2019-11-28 MED ORDER — FLUTICASONE PROPIONATE 50 MCG/ACT NA SUSP: 1.0000 | Freq: Every day | NASAL | 0 refills | Status: DC

## 2019-11-28 MED ORDER — LIDOCAINE VISCOUS HCL 2 % MT SOLN: 15.0000 mL | OROMUCOSAL | 0 refills | Status: DC | PRN

## 2019-11-28 NOTE — ED Provider Notes (Signed)
[place]   [id number] 11/28/19 Arrival Time: 8938  CC:EAR PAIN  SUBJECTIVE: History from: patient.  Alexandria Webster is a 54 y.o. female who presented to the urgent care for complaint of left ear pain and sore throat for the past 5 days.  Denies a precipitating event, or exposure to COVID, flu or strep.  Patient states the pain is constant and achy in character.  Patient has tried OTC medication without relief.  Symptoms are made worse with lying down.  Denies similar symptoms in the past.   Denies fever, chills, fatigue, sinus pain, rhinorrhea, ear discharge, sore throat, SOB, wheezing, chest pain, nausea, changes in bowel or bladder habits.    ROS: As per HPI.  All other pertinent ROS negative.     Past Medical History:  Diagnosis Date  . Asthma   . COPD (chronic obstructive pulmonary disease) ([place])   . DDD (degenerative disc disease), lumbar   . Diabetes mellitus   . Gastroparesis   . GERD without esophagitis   . High cholesterol   . Hypertension   . Hypothyroidism   . PONV (postoperative nausea and vomiting)   . PVD (peripheral vascular disease) ([place])   . Wears glasses    Past Surgical History:  Procedure Laterality Date  . AORTA - BILATERAL FEMORAL ARTERY BYPASS GRAFT N/A 09/27/2018   Procedure: Redo Exposure  AORTOBIFEMORAL Bypass and bilateral femoral Artery ; Redo Aortobifemoral  BYPASS GRAFT.;  Surgeon: Marty Heck, MD;  Location: [place];  Service: Vascular;  Laterality: N/A;  . BACK SURGERY    . CORONARY ANGIOPLASTY WITH STENT PLACEMENT    . CORONARY ARTERY BYPASS GRAFT    . FEMORAL ARTERY STENT  right leg  . TUBAL LIGATION     No Known Allergies No current facility-administered medications on file prior to encounter.   Current Outpatient Medications on File Prior to Encounter  Medication Sig Dispense Refill  . aspirin EC 81 MG tablet Take 81 mg by mouth every morning.     . budesonide-formoterol (SYMBICORT) 160-4.5 MCG/ACT inhaler Inhale 2  puffs into the lungs 2 (two) times daily.    . Cholecalciferol (VITAMIN D3) 1000 units CAPS Take 1,000 Units by mouth 2 (two) times a day.     . esomeprazole (NEXIUM) 20 MG capsule Take 20 mg by mouth daily.     . furosemide (LASIX) 20 MG tablet Take 1 tablet (20 mg total) by mouth daily. 7 tablet 0  . GARLIC PO Take 1 tablet by mouth daily.    Marland Kitchen glucose blood (ACCU-CHEK AVIVA) test strip Test glucose 4 times a day 150 each 3  . HYDROcodone-acetaminophen (NORCO/VICODIN) 5-325 MG tablet Take 1 tablet by mouth every 6 (six) hours as needed. 30 tablet 0  . insulin aspart (NOVOLOG FLEXPEN) 100 UNIT/ML FlexPen Inject 10-16 Units into the skin 3 (three) times daily with meals. (Patient taking differently: Inject 20 Units into the skin Nightly. ) 15 mL 2  . Insulin Glargine (BASAGLAR KWIKPEN) 100 UNIT/ML SOPN Inject 14-16 Units into the skin See admin instructions. Inject sliding scale three times daily with 14 units being the lowest and 16 units being the highest.    . Insulin Pen Needle (B-D UF III MINI PEN NEEDLES) 31G X 5 MM MISC Use qhs 100 each 5  . JANUMET XR [phone] MG TB24 Take 1 tablet by mouth daily.     Marland Kitchen levothyroxine (SYNTHROID, LEVOTHROID) 25 MCG tablet Take 25 mcg by mouth daily before breakfast.    .  lovastatin (MEVACOR) 20 MG tablet Take 20 mg by mouth at bedtime.    . lovastatin (MEVACOR) 40 MG tablet Take 40 mg by mouth at bedtime.     . metoCLOPramide (REGLAN) 5 MG tablet Take 1 tablet (5 mg total) by mouth 4 (four) times daily -  before meals and at bedtime. (Patient taking differently: Take 5 mg by mouth 4 (four) times daily. ) 90 tablet 0  . Multiple Vitamin (MULTIVITAMIN WITH MINERALS) TABS tablet Take 1 tablet by mouth daily.    . polyethylene glycol (MIRALAX / GLYCOLAX) packet Take 17 g by mouth daily. (Patient taking differently: Take 17 g by mouth daily as needed for moderate constipation. ) 14 each 0   Social History   Socioeconomic History  . Marital status: Married     Spouse name: Not on file  . Number of children: Not on file  . Years of education: Not on file  . Highest education level: Not on file  Occupational History  . Not on file  Tobacco Use  . Smoking status: Former Smoker    Packs/day: 0.00    Years: 36.00    Pack years: 0.00    Types: Cigarettes    Quit date: 03/14/2018    Years since quitting: 1.7  . Smokeless tobacco: Never Used  Vaping Use  . Vaping Use: Never used  Substance and Sexual Activity  . Alcohol use: No  . Drug use: No  . Sexual activity: Yes    Birth control/protection: Surgical    Comment: tubal  Other Topics Concern  . Not on file  Social History Narrative  . Not on file   Social Determinants of Health   Financial Resource Strain:   . Difficulty of Paying Living Expenses: Not on file  Food Insecurity:   . Worried About Charity fundraiser in the Last Year: Not on file  . Ran Out of Food in the Last Year: Not on file  Transportation Needs:   . Lack of Transportation (Medical): Not on file  . Lack of Transportation (Non-Medical): Not on file  Physical Activity:   . Days of Exercise per Week: Not on file  . Minutes of Exercise per Session: Not on file  Stress:   . Feeling of Stress : Not on file  Social Connections:   . Frequency of Communication with Friends and Family: Not on file  . Frequency of Social Gatherings with Friends and Family: Not on file  . Attends Religious Services: Not on file  . Active Member of Clubs or Organizations: Not on file  . Attends Archivist Meetings: Not on file  . Marital Status: Not on file  Intimate Partner Violence:   . Fear of Current or Ex-Partner: Not on file  . Emotionally Abused: Not on file  . Physically Abused: Not on file  . Sexually Abused: Not on file   Family History  Problem Relation Age of Onset  . Stroke Mother   . Hypertension Mother   . Heart failure Father   . Asthma Father   . Diabetes Father   . Heart disease Father   . Emphysema  Paternal Grandfather     OBJECTIVE:  Vitals:   11/28/19 0941  BP: (!) 146/86  Pulse: 87  Resp: 15  Temp: 98.6 F (37 C)  SpO2: 97%     Physical Exam Vitals and nursing note reviewed.  Constitutional:      General: She is not in acute distress.  Appearance: Normal appearance. She is normal weight. She is not ill-appearing, toxic-appearing or diaphoretic.  HENT:     Head: Normocephalic.     Right Ear: Tympanic membrane, ear canal and external ear normal. There is no impacted cerumen.     Left Ear: Ear canal and external ear normal. A middle ear effusion is present. There is no impacted cerumen.     Nose: Nose normal. No congestion.     Mouth/Throat:     Lips: Pink.     Mouth: Mucous membranes are moist.     Dentition: Normal dentition.     Tonsils: 1+ on the right. 2+ on the left.  Cardiovascular:     Rate and Rhythm: Normal rate and regular rhythm.     Pulses: Normal pulses.     Heart sounds: Normal heart sounds. No murmur heard.  No friction rub. No gallop.   Pulmonary:     Effort: Pulmonary effort is normal. No respiratory distress.     Breath sounds: Normal breath sounds. No stridor. No wheezing, rhonchi or rales.  Chest:     Chest wall: No tenderness.  Neurological:     Mental Status: She is alert and oriented to person, place, and time.     Imaging: No results found.   ASSESSMENT & PLAN:  1. Viral sore throat   2. Middle ear effusion, left     Meds ordered this encounter  Medications  . fluticasone (FLONASE) 50 MCG/ACT nasal spray    Sig: Place 1 spray into both nostrils daily for 14 days.    Dispense:  16 g    Refill:  0  . lidocaine (XYLOCAINE) 2 % solution    Sig: Use as directed 15 mLs in the mouth or throat as needed for mouth pain.    Dispense:  100 mL    Refill:  0   Discharge instructions  Get plenty of rest and push fluids Flonase prescribed for left middle ear effusion Viscous lidocaine prescribed.  This is an oral solution you can  swish, and gargle as needed for symptomatic relief of sore throat.  Do not exceed 8 doses in a 24 hour period.  Do not use prior to eating, as this will numb your entire mouth.   Drink warm or cool liquids, use throat lozenges, or popsicles to help alleviate symptoms Take OTC ibuprofen or tylenol as needed for pain Follow up with PCP if symptoms persists Return or go to ER if patient has any new or worsening symptoms such as fever, chills, nausea, vomiting, worsening sore throat, cough, abdominal pain, chest pain, changes in bowel or bladder habits, etc...   Reviewed expectations re: course of current medical issues. Questions answered. Outlined signs and symptoms indicating need for more acute intervention. Patient verbalized understanding. After Visit Summary given.         Emerson Monte, FNP 11/28/19 1006

## 2019-11-28 NOTE — ED Triage Notes (Signed)
Patient has left ear pain x 5 days that is getting "worser".

## 2019-11-28 NOTE — Discharge Instructions (Signed)
Get plenty of rest and push fluids Flonase prescribed for left middle ear effusion Viscous lidocaine prescribed.  This is an oral solution you can swish, and gargle as needed for symptomatic relief of sore throat.  Do not exceed 8 doses in a 24 hour period.  Do not use prior to eating, as this will numb your entire mouth.   Drink warm or cool liquids, use throat lozenges, or popsicles to help alleviate symptoms Take OTC ibuprofen or tylenol as needed for pain Follow up with PCP if symptoms persists Return or go to ER if patient has any new or worsening symptoms such as fever, chills, nausea, vomiting, worsening sore throat, cough, abdominal pain, chest pain, changes in bowel or bladder habits, etc..

## 2020-03-16 IMAGING — CR PORTABLE CHEST - 1 VIEW
1 series · 1 of 1 positions shown · non-contrast
Comparison: 09/28/2018

CLINICAL DATA: Shortness of breath and chest pain. Nonproductive
cough.

EXAM:
PORTABLE CHEST 1 VIEW

[portable]
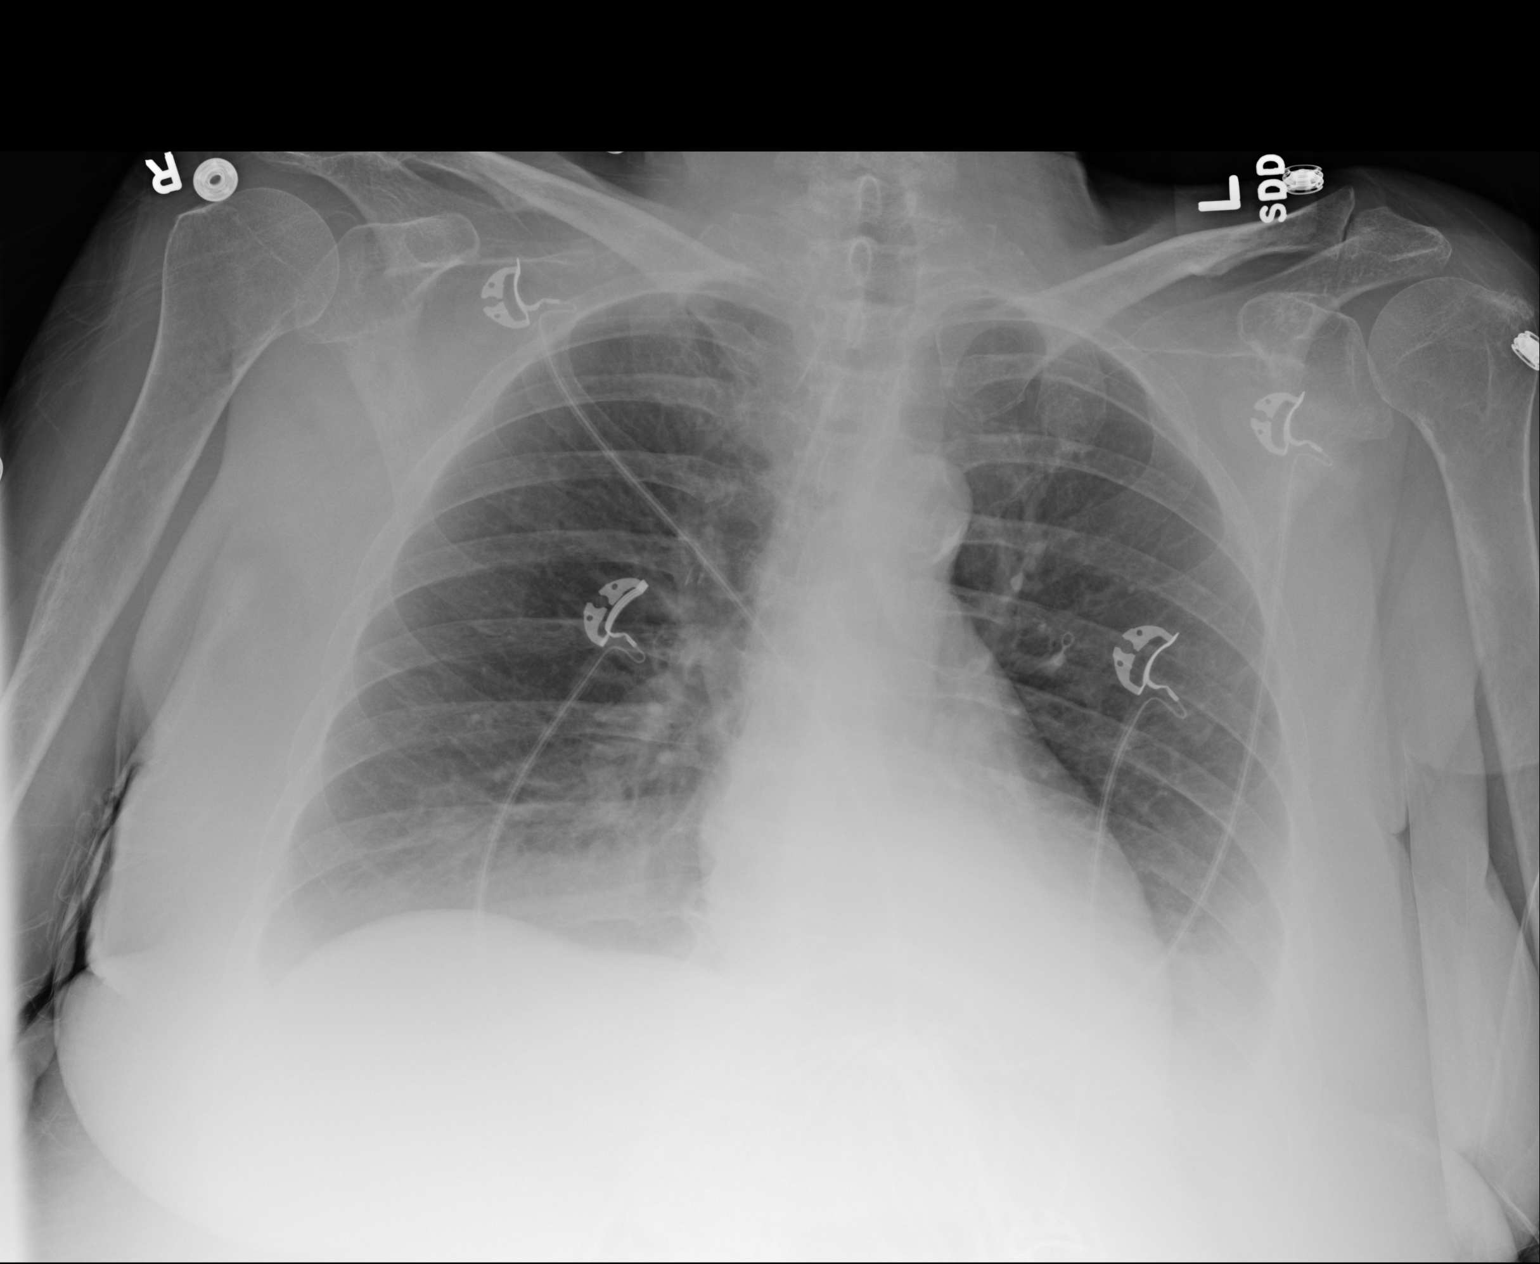

[1 of 1 positions shown; findings below may reference images not displayed]

FINDINGS: Heart size is normal. Aortic atherosclerosis. There are bilateral
pleural effusions with dependent atelectasis. Findings may relate to
congestive heart failure/fluid overload. Basilar pneumonia not
excluded but not favored. No acute bone finding.
IMPRESSION: Bilateral effusions with lower lung atelectasis. Most likely
digestive heart failure/fluid overload.

## 2020-07-20 ENCOUNTER — Other Ambulatory Visit (HOSPITAL_COMMUNITY): Payer: Self-pay | Admitting: Family Medicine

## 2020-07-20 DIAGNOSIS — Z1231 Encounter for screening mammogram for malignant neoplasm of breast: Secondary | ICD-10-CM

## 2020-07-30 ENCOUNTER — Encounter: Payer: Self-pay | Admitting: *Deleted

## 2020-08-05 ENCOUNTER — Ambulatory Visit (HOSPITAL_COMMUNITY): Payer: 59

## 2020-08-31 ENCOUNTER — Encounter: Payer: BC Managed Care – PPO | Admitting: Obstetrics & Gynecology

## 2020-12-14 ENCOUNTER — Ambulatory Visit: Payer: BC Managed Care – PPO | Admitting: Gastroenterology

## 2021-04-29 DIAGNOSIS — Z0001 Encounter for general adult medical examination with abnormal findings: Secondary | ICD-10-CM | POA: Diagnosis not present

## 2021-04-29 DIAGNOSIS — E039 Hypothyroidism, unspecified: Secondary | ICD-10-CM | POA: Diagnosis not present

## 2021-05-21 ENCOUNTER — Other Ambulatory Visit: Payer: Self-pay | Admitting: Family Medicine

## 2021-05-21 ENCOUNTER — Other Ambulatory Visit (HOSPITAL_COMMUNITY): Payer: Self-pay | Admitting: Family Medicine

## 2021-05-21 DIAGNOSIS — R0989 Other specified symptoms and signs involving the circulatory and respiratory systems: Secondary | ICD-10-CM

## 2021-06-04 ENCOUNTER — Ambulatory Visit (HOSPITAL_COMMUNITY)
Admission: RE | Admit: 2021-06-04 | Discharge: 2021-06-04 | Disposition: A | Discharge status: Home / Self Care | Payer: 59 | Source: Ambulatory Visit | Attending: Family Medicine | Admitting: Family Medicine

## 2021-06-04 ENCOUNTER — Other Ambulatory Visit: Payer: Self-pay

## 2021-06-04 DIAGNOSIS — R0989 Other specified symptoms and signs involving the circulatory and respiratory systems: Secondary | ICD-10-CM | POA: Diagnosis not present

## 2021-06-04 DIAGNOSIS — I6523 Occlusion and stenosis of bilateral carotid arteries: Secondary | ICD-10-CM | POA: Diagnosis not present

## 2021-06-29 DIAGNOSIS — Z23 Encounter for immunization: Secondary | ICD-10-CM | POA: Diagnosis not present

## 2021-07-29 DIAGNOSIS — I10 Essential (primary) hypertension: Secondary | ICD-10-CM | POA: Diagnosis not present

## 2021-07-29 DIAGNOSIS — J4520 Mild intermittent asthma, uncomplicated: Secondary | ICD-10-CM | POA: Diagnosis not present

## 2021-07-29 DIAGNOSIS — R0989 Other specified symptoms and signs involving the circulatory and respiratory systems: Secondary | ICD-10-CM | POA: Diagnosis not present

## 2021-07-29 DIAGNOSIS — E039 Hypothyroidism, unspecified: Secondary | ICD-10-CM | POA: Diagnosis not present

## 2021-07-29 DIAGNOSIS — E663 Overweight: Secondary | ICD-10-CM | POA: Diagnosis not present

## 2021-07-29 DIAGNOSIS — E118 Type 2 diabetes mellitus with unspecified complications: Secondary | ICD-10-CM | POA: Diagnosis not present

## 2021-07-29 DIAGNOSIS — E782 Mixed hyperlipidemia: Secondary | ICD-10-CM | POA: Diagnosis not present

## 2021-08-06 ENCOUNTER — Observation Stay (HOSPITAL_COMMUNITY)
Admit: 2021-08-06 | Discharge: 2021-08-06 | Disposition: A | Discharge status: Home / Self Care | Payer: 59 | Attending: Family Medicine | Admitting: Family Medicine

## 2021-08-06 ENCOUNTER — Observation Stay (HOSPITAL_COMMUNITY): Payer: 59

## 2021-08-06 ENCOUNTER — Encounter (HOSPITAL_COMMUNITY): Payer: Self-pay

## 2021-08-06 ENCOUNTER — Emergency Department (HOSPITAL_COMMUNITY): Payer: 59

## 2021-08-06 ENCOUNTER — Observation Stay (HOSPITAL_COMMUNITY)
Admission: EM | Admit: 2021-08-06 | Discharge: 2021-08-08 | Disposition: A | Discharge status: Home / Self Care | Payer: 59 | Attending: Family Medicine | Admitting: Family Medicine

## 2021-08-06 ENCOUNTER — Other Ambulatory Visit: Payer: Self-pay

## 2021-08-06 DIAGNOSIS — M5030 Other cervical disc degeneration, unspecified cervical region: Secondary | ICD-10-CM

## 2021-08-06 DIAGNOSIS — Z95828 Presence of other vascular implants and grafts: Secondary | ICD-10-CM | POA: Diagnosis not present

## 2021-08-06 DIAGNOSIS — R220 Localized swelling, mass and lump, head: Secondary | ICD-10-CM | POA: Diagnosis not present

## 2021-08-06 DIAGNOSIS — Z794 Long term (current) use of insulin: Secondary | ICD-10-CM | POA: Insufficient documentation

## 2021-08-06 DIAGNOSIS — R55 Syncope and collapse: Principal | ICD-10-CM

## 2021-08-06 DIAGNOSIS — R519 Headache, unspecified: Secondary | ICD-10-CM | POA: Diagnosis not present

## 2021-08-06 DIAGNOSIS — J309 Allergic rhinitis, unspecified: Secondary | ICD-10-CM | POA: Diagnosis not present

## 2021-08-06 DIAGNOSIS — Z7984 Long term (current) use of oral hypoglycemic drugs: Secondary | ICD-10-CM

## 2021-08-06 DIAGNOSIS — D72829 Elevated white blood cell count, unspecified: Secondary | ICD-10-CM | POA: Diagnosis not present

## 2021-08-06 DIAGNOSIS — I739 Peripheral vascular disease, unspecified: Secondary | ICD-10-CM | POA: Diagnosis not present

## 2021-08-06 DIAGNOSIS — E039 Hypothyroidism, unspecified: Secondary | ICD-10-CM | POA: Diagnosis not present

## 2021-08-06 DIAGNOSIS — R112 Nausea with vomiting, unspecified: Secondary | ICD-10-CM

## 2021-08-06 DIAGNOSIS — E785 Hyperlipidemia, unspecified: Secondary | ICD-10-CM | POA: Diagnosis present

## 2021-08-06 DIAGNOSIS — K5900 Constipation, unspecified: Secondary | ICD-10-CM | POA: Diagnosis not present

## 2021-08-06 DIAGNOSIS — M25531 Pain in right wrist: Secondary | ICD-10-CM | POA: Diagnosis not present

## 2021-08-06 DIAGNOSIS — Z743 Need for continuous supervision: Secondary | ICD-10-CM | POA: Diagnosis not present

## 2021-08-06 DIAGNOSIS — E119 Type 2 diabetes mellitus without complications: Secondary | ICD-10-CM | POA: Diagnosis not present

## 2021-08-06 DIAGNOSIS — Z79890 Hormone replacement therapy (postmenopausal): Secondary | ICD-10-CM

## 2021-08-06 DIAGNOSIS — N179 Acute kidney failure, unspecified: Secondary | ICD-10-CM | POA: Diagnosis not present

## 2021-08-06 DIAGNOSIS — Z7985 Long-term (current) use of injectable non-insulin antidiabetic drugs: Secondary | ICD-10-CM

## 2021-08-06 DIAGNOSIS — Z7982 Long term (current) use of aspirin: Secondary | ICD-10-CM | POA: Diagnosis not present

## 2021-08-06 DIAGNOSIS — M25511 Pain in right shoulder: Secondary | ICD-10-CM | POA: Diagnosis not present

## 2021-08-06 DIAGNOSIS — J449 Chronic obstructive pulmonary disease, unspecified: Secondary | ICD-10-CM | POA: Diagnosis present

## 2021-08-06 DIAGNOSIS — Z79899 Other long term (current) drug therapy: Secondary | ICD-10-CM | POA: Diagnosis not present

## 2021-08-06 DIAGNOSIS — R1111 Vomiting without nausea: Secondary | ICD-10-CM | POA: Diagnosis not present

## 2021-08-06 DIAGNOSIS — Z951 Presence of aortocoronary bypass graft: Secondary | ICD-10-CM

## 2021-08-06 DIAGNOSIS — I10 Essential (primary) hypertension: Secondary | ICD-10-CM | POA: Diagnosis not present

## 2021-08-06 DIAGNOSIS — Z87891 Personal history of nicotine dependence: Secondary | ICD-10-CM

## 2021-08-06 DIAGNOSIS — E7800 Pure hypercholesterolemia, unspecified: Secondary | ICD-10-CM | POA: Diagnosis present

## 2021-08-06 DIAGNOSIS — R531 Weakness: Secondary | ICD-10-CM | POA: Diagnosis not present

## 2021-08-06 DIAGNOSIS — I959 Hypotension, unspecified: Secondary | ICD-10-CM | POA: Diagnosis present

## 2021-08-06 DIAGNOSIS — E876 Hypokalemia: Secondary | ICD-10-CM | POA: Diagnosis present

## 2021-08-06 DIAGNOSIS — G9389 Other specified disorders of brain: Secondary | ICD-10-CM | POA: Diagnosis not present

## 2021-08-06 DIAGNOSIS — M4802 Spinal stenosis, cervical region: Secondary | ICD-10-CM

## 2021-08-06 DIAGNOSIS — E1151 Type 2 diabetes mellitus with diabetic peripheral angiopathy without gangrene: Secondary | ICD-10-CM | POA: Diagnosis present

## 2021-08-06 DIAGNOSIS — K3184 Gastroparesis: Secondary | ICD-10-CM | POA: Diagnosis present

## 2021-08-06 DIAGNOSIS — K219 Gastro-esophageal reflux disease without esophagitis: Secondary | ICD-10-CM | POA: Diagnosis present

## 2021-08-06 DIAGNOSIS — I6529 Occlusion and stenosis of unspecified carotid artery: Secondary | ICD-10-CM

## 2021-08-06 DIAGNOSIS — E1143 Type 2 diabetes mellitus with diabetic autonomic (poly)neuropathy: Secondary | ICD-10-CM | POA: Diagnosis present

## 2021-08-06 DIAGNOSIS — I7409 Other arterial embolism and thrombosis of abdominal aorta: Secondary | ICD-10-CM

## 2021-08-06 DIAGNOSIS — S0993XA Unspecified injury of face, initial encounter: Secondary | ICD-10-CM | POA: Diagnosis not present

## 2021-08-06 DIAGNOSIS — W1830XA Fall on same level, unspecified, initial encounter: Secondary | ICD-10-CM | POA: Diagnosis present

## 2021-08-06 DIAGNOSIS — E860 Dehydration: Secondary | ICD-10-CM | POA: Diagnosis present

## 2021-08-06 LAB — CBC
HCT: 39.3 % (ref 36.0–46.0)
HCT: 38.2 % (ref 36.0–46.0)
Hemoglobin: 14 g/dL (ref 12.0–15.0)
Hemoglobin: 13.5 g/dL (ref 12.0–15.0)
MCH: 33.5 pg (ref 26.0–34.0)
MCH: 33 pg (ref 26.0–34.0)
MCHC: 35.6 g/dL (ref 30.0–36.0)
MCHC: 35.3 g/dL (ref 30.0–36.0)
MCV: 94 fL (ref 80.0–100.0)
MCV: 93.4 fL (ref 80.0–100.0)
Platelets: 169 10*3/uL (ref 150–400)
Platelets: 178 10*3/uL (ref 150–400)
RBC: 4.18 MIL/uL (ref 3.87–5.11)
RBC: 4.09 MIL/uL (ref 3.87–5.11)
RDW: 12.9 % (ref 11.5–15.5)
RDW: 12.7 % (ref 11.5–15.5)
WBC: 11.9 10*3/uL — ABNORMAL HIGH (ref 4.0–10.5)
WBC: 12 10*3/uL — ABNORMAL HIGH (ref 4.0–10.5)
nRBC: 0 % (ref 0.0–0.2)
nRBC: 0 % (ref 0.0–0.2)

## 2021-08-06 LAB — ECHOCARDIOGRAM COMPLETE
Area-P 1/2: 3.77 cm2
Calc EF: 68.5 %
Height: 61 in
S' Lateral: 2.6 cm
Single Plane A2C EF: 64.9 %
Single Plane A4C EF: 71.3 %
Weight: 2194.02 oz

## 2021-08-06 LAB — HEMOGLOBIN A1C
Hgb A1c MFr Bld: 6.5 % — ABNORMAL HIGH (ref 4.8–5.6)
Mean Plasma Glucose: 139.85 mg/dL

## 2021-08-06 LAB — HIV ANTIBODY (ROUTINE TESTING W REFLEX): HIV Screen 4th Generation wRfx: NONREACTIVE

## 2021-08-06 LAB — BASIC METABOLIC PANEL
Anion gap: 13 (ref 5–15)
BUN: 25 mg/dL — ABNORMAL HIGH (ref 6–20)
CO2: 27 mmol/L (ref 22–32)
Calcium: 9.9 mg/dL (ref 8.9–10.3)
Chloride: 100 mmol/L (ref 98–111)
Creatinine, Ser: 1.77 mg/dL — ABNORMAL HIGH (ref 0.44–1.00)
GFR, Estimated: 33 mL/min — ABNORMAL LOW (ref >60)
Glucose, Bld: 155 mg/dL — ABNORMAL HIGH (ref 70–99)
Potassium: 3.3 mmol/L — ABNORMAL LOW (ref 3.5–5.1)
Sodium: 140 mmol/L (ref 135–145)

## 2021-08-06 LAB — CREATININE, SERUM
Creatinine, Ser: 1.61 mg/dL — ABNORMAL HIGH (ref 0.44–1.00)
GFR, Estimated: 37 mL/min — ABNORMAL LOW (ref >60)

## 2021-08-06 LAB — TSH: TSH: 1.159 u[IU]/mL (ref 0.350–4.500)

## 2021-08-06 LAB — GLUCOSE, CAPILLARY: Glucose-Capillary: 103 mg/dL — ABNORMAL HIGH (ref 70–99)

## 2021-08-06 LAB — MAGNESIUM: Magnesium: 1.3 mg/dL — ABNORMAL LOW (ref 1.7–2.4)

## 2021-08-06 MED ORDER — PANTOPRAZOLE SODIUM 40 MG PO TBEC
40.0000 mg | DELAYED_RELEASE_TABLET | Freq: Two times a day (BID) | ORAL | Status: DC
  Administered 2021-08-06 – 2021-08-08 (×4): 40 mg via ORAL
  Filled 2021-08-06 (×4): qty 1

## 2021-08-06 MED ORDER — OXYCODONE HCL 5 MG PO TABS
5.0000 mg | ORAL_TABLET | Freq: Four times a day (QID) | ORAL | Status: DC | PRN
  Administered 2021-08-06 – 2021-08-07 (×3): 5 mg via ORAL
  Filled 2021-08-06 (×3): qty 1

## 2021-08-06 MED ORDER — ASPIRIN 325 MG PO TABS
325.0000 mg | ORAL_TABLET | Freq: Every day | ORAL | Status: DC
  Administered 2021-08-07 – 2021-08-08 (×2): 325 mg via ORAL
  Filled 2021-08-06 (×2): qty 1

## 2021-08-06 MED ORDER — FENTANYL CITRATE PF 50 MCG/ML IJ SOSY: 12.5000 ug | PREFILLED_SYRINGE | INTRAMUSCULAR | Status: DC | PRN

## 2021-08-06 MED ORDER — LEVOTHYROXINE SODIUM 25 MCG PO TABS
25.0000 ug | ORAL_TABLET | Freq: Every day | ORAL | Status: DC
  Administered 2021-08-07 – 2021-08-08 (×2): 25 ug via ORAL
  Filled 2021-08-06 (×2): qty 1

## 2021-08-06 MED ORDER — MAGNESIUM SULFATE 2 GM/50ML IV SOLN
2.0000 g | Freq: Once | INTRAVENOUS | Status: AC
  Administered 2021-08-06: 2 g via INTRAVENOUS
  Filled 2021-08-06: qty 50

## 2021-08-06 MED ORDER — BISACODYL 5 MG PO TBEC: 5.0000 mg | DELAYED_RELEASE_TABLET | Freq: Every day | ORAL | Status: DC | PRN

## 2021-08-06 MED ORDER — ONDANSETRON HCL 4 MG PO TABS: 4.0000 mg | ORAL_TABLET | Freq: Four times a day (QID) | ORAL | Status: DC | PRN

## 2021-08-06 MED ORDER — POTASSIUM CHLORIDE CRYS ER 20 MEQ PO TBCR
40.0000 meq | EXTENDED_RELEASE_TABLET | Freq: Once | ORAL | Status: AC
  Administered 2021-08-06: 40 meq via ORAL
  Filled 2021-08-06: qty 2

## 2021-08-06 MED ORDER — LORATADINE 10 MG PO TABS
10.0000 mg | ORAL_TABLET | Freq: Every day | ORAL | Status: DC
  Administered 2021-08-07 – 2021-08-08 (×2): 10 mg via ORAL
  Filled 2021-08-06 (×2): qty 1

## 2021-08-06 MED ORDER — ENOXAPARIN SODIUM 30 MG/0.3ML IJ SOSY
30.0000 mg | PREFILLED_SYRINGE | INTRAMUSCULAR | Status: DC
  Administered 2021-08-06 – 2021-08-07 (×2): 30 mg via SUBCUTANEOUS
  Filled 2021-08-06 (×3): qty 0.3

## 2021-08-06 MED ORDER — ACETAMINOPHEN 325 MG PO TABS
650.0000 mg | ORAL_TABLET | Freq: Four times a day (QID) | ORAL | Status: DC | PRN
  Administered 2021-08-08: 650 mg via ORAL
  Filled 2021-08-06: qty 2

## 2021-08-06 MED ORDER — ONDANSETRON HCL 4 MG/2ML IJ SOLN
4.0000 mg | Freq: Four times a day (QID) | INTRAMUSCULAR | Status: DC | PRN
Start: 2021-08-06 — End: 2021-08-08

## 2021-08-06 MED ORDER — POTASSIUM CHLORIDE 10 MEQ/100ML IV SOLN
10.0000 meq | INTRAVENOUS | Status: AC
  Administered 2021-08-06 (×3): 10 meq via INTRAVENOUS
  Filled 2021-08-06 (×3): qty 100

## 2021-08-06 MED ORDER — LACTATED RINGERS IV SOLN: INTRAVENOUS | Status: DC

## 2021-08-06 MED ORDER — INSULIN ASPART 100 UNIT/ML IJ SOLN
0.0000 [IU] | Freq: Three times a day (TID) | INTRAMUSCULAR | Status: DC
  Administered 2021-08-07 (×2): 2 [IU] via SUBCUTANEOUS
  Administered 2021-08-08: 1 [IU] via SUBCUTANEOUS

## 2021-08-06 MED ORDER — TRAZODONE HCL 50 MG PO TABS
100.0000 mg | ORAL_TABLET | Freq: Every day | ORAL | Status: DC
Start: 2021-08-06 — End: 2021-08-08
  Administered 2021-08-06 – 2021-08-07 (×2): 100 mg via ORAL
  Filled 2021-08-06 (×2): qty 2

## 2021-08-06 MED ORDER — PRAVASTATIN SODIUM 10 MG PO TABS
20.0000 mg | ORAL_TABLET | Freq: Every day | ORAL | Status: DC
Start: 2021-08-06 — End: 2021-08-08
  Administered 2021-08-06 – 2021-08-07 (×2): 20 mg via ORAL
  Filled 2021-08-06 (×2): qty 2

## 2021-08-06 MED ORDER — PANTOPRAZOLE SODIUM 40 MG PO TBEC
40.0000 mg | DELAYED_RELEASE_TABLET | Freq: Every day | ORAL | Status: DC
  Administered 2021-08-06: 40 mg via ORAL
  Filled 2021-08-06: qty 1

## 2021-08-06 MED ORDER — ACETAMINOPHEN 650 MG RE SUPP
650.0000 mg | Freq: Four times a day (QID) | RECTAL | Status: DC | PRN
Start: 2021-08-06 — End: 2021-08-08

## 2021-08-06 MED ORDER — HYDRALAZINE HCL 20 MG/ML IJ SOLN
5.0000 mg | INTRAMUSCULAR | Status: DC | PRN
Start: 2021-08-06 — End: 2021-08-08

## 2021-08-06 NOTE — Assessment & Plan Note (Signed)
--   follow up with vascular surgery

## 2021-08-06 NOTE — Progress Notes (Signed)
  Echocardiogram 2D Echocardiogram has been performed.  Alexandria Webster 08/06/2021, 3:51 PM

## 2021-08-06 NOTE — ED Notes (Signed)
Patient transported to MRI 

## 2021-08-06 NOTE — Assessment & Plan Note (Signed)
--   presumably this is prerenal given recent bout of poor oral intake for 2 weeks -- hydrating with IV fluid and renally dosing - recheck BMP in AM after hydration -- renal function improving with  IV fluid hydration creatinine down to 1.10

## 2021-08-06 NOTE — Hospital Course (Signed)
56 year old female with type 2 diabetes mellitus, COPD, GERD, hypertension, hyperlipidemia, hypothyroidism, PVD status post aortobifemoral bypass surgery had been in her usual state of health when she got up to go to the bathroom this morning she passed out.  She was not even aware that she had passed out.  Her son found her on the floor and she thought that she was still in the bed.  She was confused.  She hit her right shoulder and right wrist.  She hit her head.  She has no history of epilepsy.  She has no prior history of syncope other than a small episode she had years ago when she was dehydrated and was found to be hypotensive at that time.  She denies having chest pain.  She only complains of right shoulder and wrist pain and left rib pain.  She was evaluated in the ED noted to have an elevated creatinine, low potassium and magnesium and WBC of 11.9.  She reports that she had approximately a 2-week period of poor oral intake due to nausea and vomiting.  She is feeling better now from that standpoint but was told by her PCP that she had a bump in her creatinine when it was tested a couple of days ago.  She is being admitted for sudden syncopal episode and metabolic derangements.    08/08/2021: pt reports feeling well today, she is ambulating well, her labs are much improved, renal function normal, tolerating diet, she says she was seeing vascular surgery but was lost to follow up during covid but willing to go back, also requested GI referral as she has been dealing with recurrent bouts of n/v and poor oral intake tolerance.  Pt says she is willing to do outpatient MRI of cervical spine as MRI not available at hospital for next 2 days due to weekend and holiday. Pt will follow up with her PCP for ongoing surveillance and management.

## 2021-08-06 NOTE — Progress Notes (Signed)
EEG completed, results pending. 

## 2021-08-06 NOTE — ED Triage Notes (Signed)
Per EMS, Pt has been vomiting x2 weeks and had a syncopal fall this morning.  Pt c/o L chin and R wrist & R shoulder pain.  Pain score 10/10.  Limited ROM noted to RUE and bruising/lac noted to L chin.   Pt was seen by PCP for emesis and told her "kidneys are messed up."

## 2021-08-06 NOTE — Assessment & Plan Note (Signed)
--   replace magnesium and then replace potassium -- repeat BMP in AM

## 2021-08-06 NOTE — Assessment & Plan Note (Signed)
--   stable, follow up with vascular surgery for surveillance

## 2021-08-06 NOTE — ED Notes (Signed)
Patient  c/o pain in right shoulder and wrist, notified MD

## 2021-08-06 NOTE — Assessment & Plan Note (Signed)
--   laxatives ordered as needed

## 2021-08-06 NOTE — Assessment & Plan Note (Signed)
--   possibly reactive given recent fall -- no s/s of infection found but still awaiting UA.   -- repeat CBC/diff in AM.

## 2021-08-06 NOTE — Assessment & Plan Note (Signed)
--   resume home levothyroxine

## 2021-08-06 NOTE — Assessment & Plan Note (Signed)
--   loratadine ordered

## 2021-08-06 NOTE — Assessment & Plan Note (Signed)
--   IV magnesium replacement ordered, repleted

## 2021-08-06 NOTE — H&P (Signed)
History and Physical  [place] RXV:[id number] DOB: 03/29/1965 DOA: 08/06/2021  PCP: Jake Samples, PA-C  Patient coming from: Home  Level of care: Telemetry  I have personally briefly reviewed patient's old medical records in [place]  Chief Complaint: passed out at home   HPI: Alexandria Webster is a 56 year old female with type 2 diabetes mellitus, COPD, GERD, hypertension, hyperlipidemia, hypothyroidism, PVD status post aortobifemoral bypass surgery had been in her usual state of health when she got up to go to the bathroom this morning she passed out.  She was not even aware that she had passed out.  Her son found her on the floor and she thought that she was still in the bed.  She was confused.  She hit her right shoulder and right wrist.  She hit her head.  She has no history of epilepsy.  She has no prior history of syncope other than a small episode she had years ago when she was dehydrated and was found to be hypotensive at that time.  She denies having chest pain.  She only complains of right shoulder and wrist pain and left rib pain.  She was evaluated in the ED noted to have an elevated creatinine, low potassium and magnesium and WBC of 11.9.  She reports that she had approximately a 2-week period of poor oral intake due to nausea and vomiting.  She is feeling better now from that standpoint but was told by her PCP that she had a bump in her creatinine when it was tested a couple of days ago.  She is being admitted for sudden syncopal episode and metabolic derangements.    Review of Systems: Review of Systems  Constitutional: Negative.   HENT:  Positive for congestion. Negative for ear discharge, ear pain, sinus pain and sore throat.   Eyes: Negative.   Respiratory:  Negative for cough, sputum production, shortness of breath and wheezing.   Cardiovascular:  Negative for chest pain, palpitations and PND.  Gastrointestinal:  Positive for heartburn  and nausea. Negative for abdominal pain, diarrhea and vomiting.  Genitourinary: Negative.   Musculoskeletal:  Positive for falls, joint pain, myalgias and neck pain.  Skin: Negative.   Neurological:  Positive for dizziness and loss of consciousness.  Endo/Heme/Allergies:  Bruises/bleeds easily.  Psychiatric/Behavioral: Negative.    All other systems reviewed and are negative.   Past Medical History:  Diagnosis Date   Asthma    COPD (chronic obstructive pulmonary disease) (HCC)    DDD (degenerative disc disease), lumbar    Diabetes mellitus    Gastroparesis    GERD without esophagitis    High cholesterol    Hypertension    Hypothyroidism    PONV (postoperative nausea and vomiting)    PVD (peripheral vascular disease) ([place])    Wears glasses     Past Surgical History:  Procedure Laterality Date   AORTA - BILATERAL FEMORAL ARTERY BYPASS GRAFT N/A 09/27/2018   Procedure: Redo Exposure  AORTOBIFEMORAL Bypass and bilateral femoral Artery ; Redo Aortobifemoral  BYPASS GRAFT.;  Surgeon: Marty Heck, MD;  Location: [facility];  Service: Vascular;  Laterality: N/A;   BACK SURGERY     CORONARY ANGIOPLASTY WITH STENT PLACEMENT     CORONARY ARTERY BYPASS GRAFT     FEMORAL ARTERY STENT  right leg   TUBAL LIGATION       reports that she quit smoking about 3 years ago. Her smoking use included cigarettes.  Her smokeless tobacco use includes snuff. She reports that she does not drink alcohol and does not use drugs.  No Known Allergies  Family History  Problem Relation Age of Onset   Stroke Mother    Hypertension Mother    Heart failure Father    Asthma Father    Diabetes Father    Heart disease Father    Emphysema Paternal Grandfather     Prior to Admission medications   Medication Sig Start Date End Date Taking? Authorizing Provider  aspirin 325 MG tablet Take 325 mg by mouth daily.   Yes [provider]  cetirizine (ZYRTEC) 10 MG tablet Take 10 mg by mouth daily.  07/23/21  Yes [provider]  Cholecalciferol (VITAMIN D3) 1000 units CAPS Take 1,000 Units by mouth 2 (two) times a day.    Yes [provider]  esomeprazole (NEXIUM) 20 MG capsule Take 40 mg by mouth daily.   Yes [provider]  furosemide (LASIX) 20 MG tablet Take 1 tablet (20 mg total) by mouth daily. 10/12/18  Yes Fredia Sorrow, MD  levothyroxine (SYNTHROID, LEVOTHROID) 25 MCG tablet Take 25 mcg by mouth daily before breakfast.   Yes [provider]  lisinopril (ZESTRIL) 20 MG tablet Take 20 mg by mouth daily. 07/23/21  Yes [provider]  lovastatin (MEVACOR) 20 MG tablet Take 20 mg by mouth at bedtime.   Yes [provider]  meloxicam (MOBIC) 7.5 MG tablet Take 7.5 mg by mouth 2 (two) times daily as needed for pain. 07/29/21  Yes [provider]  metFORMIN (GLUCOPHAGE) 500 MG tablet Take 500 mg by mouth 2 (two) times daily. 08/05/21  Yes [provider]  traZODone (DESYREL) 100 MG tablet Take 100 mg by mouth daily. 07/29/21  Yes [provider]  TRULICITY 1.5 TJ/0.3ES SOPN Inject 1.5 mg into the skin once a week. Sunday 07/29/21  Yes [provider]  fluticasone (FLONASE) 50 MCG/ACT nasal spray Place 1 spray into both nostrils daily for 14 days. 11/28/19 12/12/19  Avegno, Darrelyn Hillock, FNP  glucose blood (ACCU-CHEK AVIVA) test strip Test glucose 4 times a day 09/28/15   Cassandria Anger, MD  Insulin Pen Needle (B-D UF III MINI PEN NEEDLES) 31G X 5 MM MISC Use qhs 09/17/15   Cassandria Anger, MD    Physical Exam: Vitals:   08/06/21 1030 08/06/21 1100 08/06/21 1130 08/06/21 1440  BP: (!) 138/100 (!) 152/83 (!) 144/80   Pulse: 84 82 94   Resp: '15 15 17   '$ Temp:      TempSrc:      SpO2: 95% 100% 99%   Weight:    62.2 kg  Height:    '5\' 1"'$  (1.549 m)    Constitutional: Pt has edema on left side of face from fall and left occiput edema, NAD, calm, comfortable Eyes: PERRL, lids and conjunctivae  normal ENMT: Mucous membranes are moist. Posterior pharynx clear of any exudate or lesions.Normal dentition.  Neck: normal, supple, no masses, no thyromegaly Respiratory: clear to auscultation bilaterally, no wheezing, no crackles. Normal respiratory effort. No accessory muscle use.  Cardiovascular: normal s1, s2 sounds, no murmurs / rubs / gallops. No extremity edema. 2+ pedal pulses. No carotid bruits.  Abdomen: no tenderness, no masses palpated. No hepatosplenomegaly. Bowel sounds positive.  Musculoskeletal: no clubbing / cyanosis. No joint deformity upper and lower extremities. Good ROM, no contractures. Normal muscle tone.  Skin: no rashes, lesions, ulcers. No induration Neurologic: CN 2-12 grossly  intact. Sensation intact, DTR normal. Strength 5/5 in all 4.  Psychiatric: Normal judgment and insight. Alert and oriented x 3. Normal mood.   Labs on Admission: I have personally reviewed following labs and imaging studies  CBC: Recent Labs  Lab 08/06/21 1004  WBC 11.9*  HGB 14.0  HCT 39.3  MCV 94.0  PLT 540   Basic Metabolic Panel: Recent Labs  Lab 08/06/21 1004 08/06/21 1044  NA 140  --   K 3.3*  --   CL 100  --   CO2 27  --   GLUCOSE 155*  --   BUN 25*  --   CREATININE 1.77*  --   CALCIUM 9.9  --   MG  --  1.3*   GFR: Estimated Creatinine Clearance: 29.7 mL/min (A) (by C-G formula based on SCr of 1.77 mg/dL (H)). Liver Function Tests: No results for input(s): AST, ALT, ALKPHOS, BILITOT, PROT, ALBUMIN in the last 168 hours. No results for input(s): LIPASE, AMYLASE in the last 168 hours. No results for input(s): AMMONIA in the last 168 hours. Coagulation Profile: No results for input(s): INR, PROTIME in the last 168 hours. Cardiac Enzymes: No results for input(s): CKTOTAL, CKMB, CKMBINDEX, TROPONINI in the last 168 hours. BNP (last 3 results) No results for input(s): PROBNP in the last 8760 hours. HbA1C: No results for input(s): HGBA1C in the last 72  hours. CBG: No results for input(s): GLUCAP in the last 168 hours. Lipid Profile: No results for input(s): CHOL, HDL, LDLCALC, TRIG, CHOLHDL, LDLDIRECT in the last 72 hours. Thyroid Function Tests: No results for input(s): TSH, T4TOTAL, FREET4, T3FREE, THYROIDAB in the last 72 hours. Anemia Panel: No results for input(s): VITAMINB12, FOLATE, FERRITIN, TIBC, IRON, RETICCTPCT in the last 72 hours. Urine analysis:    Component Value Date/Time   COLORURINE STRAW (A) 09/24/2018 1046   APPEARANCEUR CLEAR 09/24/2018 1046   LABSPEC 1.027 09/24/2018 1046   PHURINE 6.0 09/24/2018 1046   GLUCOSEU >=500 (A) 09/24/2018 1046   HGBUR NEGATIVE 09/24/2018 1046   BILIRUBINUR NEGATIVE 09/24/2018 1046   KETONESUR 5 (A) 09/24/2018 1046   PROTEINUR 30 (A) 09/24/2018 1046   UROBILINOGEN 0.2 12/21/2014 0902   NITRITE NEGATIVE 09/24/2018 1046   LEUKOCYTESUR NEGATIVE 09/24/2018 1046    Radiological Exams on Admission: CT Head Wo Contrast  Result Date: 08/06/2021 CLINICAL DATA:  Facial trauma, blunt. Fall from standing position. Syncopal episode. Pain along the left side of the chin. EXAM: CT HEAD WITHOUT CONTRAST CT MAXILLOFACIAL WITHOUT CONTRAST TECHNIQUE: Multidetector CT imaging of the head and maxillofacial structures were performed using the standard protocol without intravenous contrast. Multiplanar CT image reconstructions of the maxillofacial structures were also generated. RADIATION DOSE REDUCTION: This exam was performed according to the departmental dose-optimization program which includes automated exposure control, adjustment of the mA and/or kV according to patient size and/or use of iterative reconstruction technique. COMPARISON:  None Available. FINDINGS: CT HEAD FINDINGS Brain: No acute infarct, hemorrhage, or mass lesion is present. Mild white matter changes are present. A lacunar infarct in the left thalamus is likely remote. The ventricles are of normal size. No significant extraaxial fluid  collection is present. Vascular: Atherosclerotic calcifications within the cavernous internal carotid arteries have increased over time. No hyperdense vessel is present. Skull: Calvarium is intact. No focal lytic or blastic lesions are present. A left parietal scalp lesion measures 18 mm in maximal axial dimension cephalo caudad measurement is increased from 10 mm to 20 mm. Peripheral calcification is noted. A smaller  subcutaneous lesion with calcification is present in the right occipital scalp, and increased in size to now 7 mm. Other: None CT MAXILLOFACIAL FINDINGS Osseous: No fracture or mandibular dislocation. No destructive process. Orbits: Negative. No traumatic or inflammatory finding. Sinuses: The paranasal sinuses and mastoid air cells are clear. Soft tissues: Soft tissue swelling diffuse stranding present to the left of the horizontal ramus of the mandible without underlying fracture or foreign body. Muscles are intact. IMPRESSION: 1. Soft tissue swelling to the left of the horizontal ramus of the mandible without underlying fracture or foreign body. Consistent with edema and soft tissue hematoma. 2. No underlying fracture or dislocation of the mandible. 3. No acute intracranial abnormality. 4. Mild white matter disease is likely remote. 5. Remote lacunar infarct in the left thalamus. 6. Enlarging subcutaneous lesions in the left parietal and right occipital scalp. These likely represent benign lesions such as sebaceous cysts. Subcutaneous lymph nodes are also considered. These areas should be amenable to direct examination. Electronically Signed   By: San Morelle M.D.   On: 08/06/2021 11:51   MR BRAIN WO CONTRAST  Result Date: 08/06/2021 CLINICAL DATA:  Provided history: Neuro deficit, acute, stroke suspected; mental status change, unknown cause. Additional history provided: The and fall this morning. EXAM: MRI HEAD WITHOUT CONTRAST TECHNIQUE: Multiplanar, multiecho pulse sequences of the  brain and surrounding structures were obtained without intravenous contrast. COMPARISON:  Prior head CT examinations 08/06/2021 and earlier. FINDINGS: Brain: No age advanced or lobar predominant parenchymal atrophy. Chronic lacunar infarcts within the bilateral cerebral hemispheric white matter. Additional age advanced multifocal T2 FLAIR hyperintense signal abnormality within the cerebral white matter, nonspecific but likely also reflecting chronic small vessel ischemic disease given the patient's history of diabetes, hypertension and hypercholesterolemia. Chronic lacunar infarcts within the bilateral thalami. Small chronic infarcts within the bilateral cerebellar hemispheres. There is no acute infarct. No evidence of an intracranial mass. No chronic intracranial blood products. No extra-axial fluid collection. No midline shift. Vascular: Maintained flow voids within the proximal large arterial vessels. Skull and upper cervical spine: No focal suspicious marrow lesion. Sinuses/Orbits: No mass or acute finding within the imaged orbits. Minimal mucosal thickening within the bilateral ethmoid sinuses. Other: Left facial soft tissue swelling. Ovoid T2 intermediate to hypointense lesions measuring up to 20 x 9 mm within the right occipital and left temporal occipital scalp, nonspecific but likely benign and possibly reflecting sebaceous cysts. IMPRESSION: 1. No evidence of acute intracranial abnormality. 2. Age advanced chronic small vessel ischemic changes within the cerebral white matter, including chronic lacunar infarcts. 3. Chronic lacunar infarcts within the bilateral thalami. 4. Small chronic infarcts within the bilateral cerebellar hemispheres. 5. Left facial soft tissue swelling. Electronically Signed   By: Kellie Simmering D.O.   On: 08/06/2021 13:43   CT Maxillofacial Wo Contrast  Result Date: 08/06/2021 CLINICAL DATA:  Facial trauma, blunt. Fall from standing position. Syncopal episode. Pain along the left  side of the chin. EXAM: CT HEAD WITHOUT CONTRAST CT MAXILLOFACIAL WITHOUT CONTRAST TECHNIQUE: Multidetector CT imaging of the head and maxillofacial structures were performed using the standard protocol without intravenous contrast. Multiplanar CT image reconstructions of the maxillofacial structures were also generated. RADIATION DOSE REDUCTION: This exam was performed according to the departmental dose-optimization program which includes automated exposure control, adjustment of the mA and/or kV according to patient size and/or use of iterative reconstruction technique. COMPARISON:  None Available. FINDINGS: CT HEAD FINDINGS Brain: No acute infarct, hemorrhage, or mass lesion is present. Mild white  matter changes are present. A lacunar infarct in the left thalamus is likely remote. The ventricles are of normal size. No significant extraaxial fluid collection is present. Vascular: Atherosclerotic calcifications within the cavernous internal carotid arteries have increased over time. No hyperdense vessel is present. Skull: Calvarium is intact. No focal lytic or blastic lesions are present. A left parietal scalp lesion measures 18 mm in maximal axial dimension cephalo caudad measurement is increased from 10 mm to 20 mm. Peripheral calcification is noted. A smaller subcutaneous lesion with calcification is present in the right occipital scalp, and increased in size to now 7 mm. Other: None CT MAXILLOFACIAL FINDINGS Osseous: No fracture or mandibular dislocation. No destructive process. Orbits: Negative. No traumatic or inflammatory finding. Sinuses: The paranasal sinuses and mastoid air cells are clear. Soft tissues: Soft tissue swelling diffuse stranding present to the left of the horizontal ramus of the mandible without underlying fracture or foreign body. Muscles are intact. IMPRESSION: 1. Soft tissue swelling to the left of the horizontal ramus of the mandible without underlying fracture or foreign body.  Consistent with edema and soft tissue hematoma. 2. No underlying fracture or dislocation of the mandible. 3. No acute intracranial abnormality. 4. Mild white matter disease is likely remote. 5. Remote lacunar infarct in the left thalamus. 6. Enlarging subcutaneous lesions in the left parietal and right occipital scalp. These likely represent benign lesions such as sebaceous cysts. Subcutaneous lymph nodes are also considered. These areas should be amenable to direct examination. Electronically Signed   By: San Morelle M.D.   On: 08/06/2021 11:51    EKG: Independently reviewed. NSR  Assessment/Plan Principal Problem:   Syncope and collapse Active Problems:   Essential hypertension, benign   Leukocytosis   Type 2 diabetes mellitus (HCC)   PVD (peripheral vascular disease) (HCC)   Constipation   Hypokalemia   Status post aortobifemoral bypass surgery   Aortoiliac occlusive disease (HCC)   Hypomagnesemia   Hypothyroidism   Allergic rhinitis   AKI (acute kidney injury) ([place])   Assessment and Plan: * Syncope and collapse -- likely metabolic cause given dehydration, AKI, hypotension -- work up with 2D echo, carotid dopplers, MRI brain, EEG, PT/OT evaluation -- fall precautions recommended   AKI (acute kidney injury) ([place]) -- presumably this is prerenal given recent bout of poor oral intake for 2 weeks -- hydrating with IV fluid and renally dosing - recheck BMP in AM after hydration   Allergic rhinitis -- loratadine ordered   Hypothyroidism -- resume home levothyroxine    Hypomagnesemia -- IV magnesium replacement ordered, recheck in AM   Aortoiliac occlusive disease ([place]) -- pt is s/p aortobifemoral bypass   Status post aortobifemoral bypass surgery -- follow up with vascular surgery   Hypokalemia -- replace magnesium and then replace potassium -- repeat BMP in AM   Constipation -- laxatives ordered as needed   PVD (peripheral vascular disease) ([place]) -- stable,  follow up with vascular surgery for surveillance   Type 2 diabetes mellitus ([place]) -- continue SSI coverage with frequent CBG monitoring   Leukocytosis -- possibly reactive given recent fall -- no s/s of infection found but still awaiting UA.   -- repeat CBC/diff in AM.   Essential hypertension, benign -- BPs have been soft, will monitor,  Hold BP med for now -- reassess after adequate hydration    DVT prophylaxis: enoxaparin  Code Status: full   Family Communication: daughter at bedside 5/26    Disposition Plan: TBD   Consults  called: PT/OT   Admission status: OBS   Level of care: Telemetry Irwin Brakeman MD [facility] How to contact the [facility] Attending or Consulting provider [place] or covering provider during after hours [place], for this patient?  Check the care team in [facility] and look for a) attending/consulting [facility] provider listed and b) the [facility] team listed Log into [email or web address] and use [place]'s universal password to access. If you do not have the password, please contact the hospital operator. Locate the [facility] provider you are looking for under [facility] and page to a number that you can be directly reached. If you still have difficulty reaching the provider, please page the [facility] (Director on Call) for the Hospitalists listed on amion for assistance.   If 7PM-7AM, please contact night-coverage [email or web address] Password [facility]  08/06/2021, 4:34 PM

## 2021-08-06 NOTE — Assessment & Plan Note (Signed)
--   BPs initially were soft but rebounded with hydration, resume home BP meds

## 2021-08-06 NOTE — ED Notes (Signed)
Patient transported to CT 

## 2021-08-06 NOTE — Assessment & Plan Note (Signed)
--   continue SSI coverage with frequent CBG monitoring  CBG (last 3)  Recent Labs    08/08/21 0229 08/08/21 0718 08/08/21 1119  GLUCAP 130* 138* 142*   Resume home treatments: we did change metformin to metformin ER 1000 mg BID with meals

## 2021-08-06 NOTE — ED Notes (Signed)
Unsuccessful lab draw.

## 2021-08-06 NOTE — Assessment & Plan Note (Signed)
--   pt is s/p aortobifemoral bypass and will need to follow up with vascular surgery, referral made today

## 2021-08-06 NOTE — Assessment & Plan Note (Addendum)
--   likely metabolic cause given dehydration, AKI, hypotension -- work up with 2D echo, carotid dopplers, MRI brain, EEG, PT/OT evaluation: tests have been reassuring and unrevealing.  Still waiting on carotid doppler studies.   -- fall precautions recommended

## 2021-08-06 NOTE — ED Notes (Signed)
Patient  unable to stand safely for blood pressure

## 2021-08-07 ENCOUNTER — Observation Stay (HOSPITAL_COMMUNITY): Payer: 59

## 2021-08-07 ENCOUNTER — Inpatient Hospital Stay (HOSPITAL_COMMUNITY): Payer: 59

## 2021-08-07 DIAGNOSIS — Z7982 Long term (current) use of aspirin: Secondary | ICD-10-CM | POA: Diagnosis not present

## 2021-08-07 DIAGNOSIS — K3184 Gastroparesis: Secondary | ICD-10-CM | POA: Diagnosis present

## 2021-08-07 DIAGNOSIS — I959 Hypotension, unspecified: Secondary | ICD-10-CM | POA: Diagnosis present

## 2021-08-07 DIAGNOSIS — N179 Acute kidney failure, unspecified: Secondary | ICD-10-CM | POA: Diagnosis not present

## 2021-08-07 DIAGNOSIS — E1143 Type 2 diabetes mellitus with diabetic autonomic (poly)neuropathy: Secondary | ICD-10-CM | POA: Diagnosis present

## 2021-08-07 DIAGNOSIS — J309 Allergic rhinitis, unspecified: Secondary | ICD-10-CM | POA: Diagnosis not present

## 2021-08-07 DIAGNOSIS — I10 Essential (primary) hypertension: Secondary | ICD-10-CM | POA: Diagnosis present

## 2021-08-07 DIAGNOSIS — J449 Chronic obstructive pulmonary disease, unspecified: Secondary | ICD-10-CM | POA: Diagnosis present

## 2021-08-07 DIAGNOSIS — I6523 Occlusion and stenosis of bilateral carotid arteries: Secondary | ICD-10-CM | POA: Diagnosis not present

## 2021-08-07 DIAGNOSIS — E785 Hyperlipidemia, unspecified: Secondary | ICD-10-CM | POA: Diagnosis present

## 2021-08-07 DIAGNOSIS — W1830XA Fall on same level, unspecified, initial encounter: Secondary | ICD-10-CM | POA: Diagnosis present

## 2021-08-07 DIAGNOSIS — I7409 Other arterial embolism and thrombosis of abdominal aorta: Secondary | ICD-10-CM | POA: Diagnosis not present

## 2021-08-07 DIAGNOSIS — E039 Hypothyroidism, unspecified: Secondary | ICD-10-CM | POA: Diagnosis present

## 2021-08-07 DIAGNOSIS — M5030 Other cervical disc degeneration, unspecified cervical region: Secondary | ICD-10-CM | POA: Diagnosis not present

## 2021-08-07 DIAGNOSIS — E7800 Pure hypercholesterolemia, unspecified: Secondary | ICD-10-CM | POA: Diagnosis present

## 2021-08-07 DIAGNOSIS — E1151 Type 2 diabetes mellitus with diabetic peripheral angiopathy without gangrene: Secondary | ICD-10-CM | POA: Diagnosis present

## 2021-08-07 DIAGNOSIS — K5900 Constipation, unspecified: Secondary | ICD-10-CM

## 2021-08-07 DIAGNOSIS — Z951 Presence of aortocoronary bypass graft: Secondary | ICD-10-CM | POA: Diagnosis not present

## 2021-08-07 DIAGNOSIS — E876 Hypokalemia: Secondary | ICD-10-CM | POA: Diagnosis not present

## 2021-08-07 DIAGNOSIS — K219 Gastro-esophageal reflux disease without esophagitis: Secondary | ICD-10-CM | POA: Diagnosis present

## 2021-08-07 DIAGNOSIS — Z79890 Hormone replacement therapy (postmenopausal): Secondary | ICD-10-CM | POA: Diagnosis not present

## 2021-08-07 DIAGNOSIS — Z7984 Long term (current) use of oral hypoglycemic drugs: Secondary | ICD-10-CM | POA: Diagnosis not present

## 2021-08-07 DIAGNOSIS — I6503 Occlusion and stenosis of bilateral vertebral arteries: Secondary | ICD-10-CM | POA: Diagnosis not present

## 2021-08-07 DIAGNOSIS — R55 Syncope and collapse: Secondary | ICD-10-CM | POA: Diagnosis not present

## 2021-08-07 DIAGNOSIS — Z7985 Long-term (current) use of injectable non-insulin antidiabetic drugs: Secondary | ICD-10-CM | POA: Diagnosis not present

## 2021-08-07 DIAGNOSIS — Z794 Long term (current) use of insulin: Secondary | ICD-10-CM | POA: Diagnosis not present

## 2021-08-07 DIAGNOSIS — Z79899 Other long term (current) drug therapy: Secondary | ICD-10-CM | POA: Diagnosis not present

## 2021-08-07 DIAGNOSIS — E860 Dehydration: Secondary | ICD-10-CM | POA: Diagnosis present

## 2021-08-07 LAB — CBC WITH DIFFERENTIAL/PLATELET
Abs Immature Granulocytes: 0.02 10*3/uL (ref 0.00–0.07)
Basophils Absolute: 0 10*3/uL (ref 0.0–0.1)
Basophils Relative: 1 %
Eosinophils Absolute: 0.1 10*3/uL (ref 0.0–0.5)
Eosinophils Relative: 2 %
HCT: 32.4 % — ABNORMAL LOW (ref 36.0–46.0)
Hemoglobin: 11.5 g/dL — ABNORMAL LOW (ref 12.0–15.0)
Immature Granulocytes: 0 %
Lymphocytes Relative: 32 %
Lymphs Abs: 2.1 10*3/uL (ref 0.7–4.0)
MCH: 33.5 pg (ref 26.0–34.0)
MCHC: 35.5 g/dL (ref 30.0–36.0)
MCV: 94.5 fL (ref 80.0–100.0)
Monocytes Absolute: 0.6 10*3/uL (ref 0.1–1.0)
Monocytes Relative: 9 %
Neutro Abs: 3.7 10*3/uL (ref 1.7–7.7)
Neutrophils Relative %: 56 %
Platelets: 160 10*3/uL (ref 150–400)
RBC: 3.43 MIL/uL — ABNORMAL LOW (ref 3.87–5.11)
RDW: 13 % (ref 11.5–15.5)
WBC: 6.5 10*3/uL (ref 4.0–10.5)
nRBC: 0 % (ref 0.0–0.2)

## 2021-08-07 LAB — BASIC METABOLIC PANEL
Anion gap: 5 (ref 5–15)
BUN: 20 mg/dL (ref 6–20)
CO2: 27 mmol/L (ref 22–32)
Calcium: 8.9 mg/dL (ref 8.9–10.3)
Chloride: 107 mmol/L (ref 98–111)
Creatinine, Ser: 1.1 mg/dL — ABNORMAL HIGH (ref 0.44–1.00)
GFR, Estimated: 59 mL/min — ABNORMAL LOW (ref >60)
Glucose, Bld: 117 mg/dL — ABNORMAL HIGH (ref 70–99)
Potassium: 3.9 mmol/L (ref 3.5–5.1)
Sodium: 139 mmol/L (ref 135–145)

## 2021-08-07 LAB — LIPID PANEL
Cholesterol: 146 mg/dL (ref 0–200)
HDL: 44 mg/dL (ref >40)
LDL Cholesterol: 78 mg/dL (ref 0–99)
Total CHOL/HDL Ratio: 3.3 RATIO
Triglycerides: 120 mg/dL (ref <150)
VLDL: 24 mg/dL (ref 0–40)

## 2021-08-07 LAB — URINALYSIS, ROUTINE W REFLEX MICROSCOPIC
Bilirubin Urine: NEGATIVE
Glucose, UA: >=500 mg/dL — AB
Ketones, ur: NEGATIVE mg/dL
Nitrite: NEGATIVE
Protein, ur: NEGATIVE mg/dL
Specific Gravity, Urine: 1.016 (ref 1.005–1.030)
pH: 7 (ref 5.0–8.0)

## 2021-08-07 LAB — GLUCOSE, CAPILLARY
Glucose-Capillary: 80 mg/dL (ref 70–99)
Glucose-Capillary: 101 mg/dL — ABNORMAL HIGH (ref 70–99)
Glucose-Capillary: 153 mg/dL — ABNORMAL HIGH (ref 70–99)
Glucose-Capillary: 158 mg/dL — ABNORMAL HIGH (ref 70–99)
Glucose-Capillary: 310 mg/dL — ABNORMAL HIGH (ref 70–99)
Glucose-Capillary: 323 mg/dL — ABNORMAL HIGH (ref 70–99)

## 2021-08-07 LAB — MAGNESIUM: Magnesium: 2.1 mg/dL (ref 1.7–2.4)

## 2021-08-07 MED ORDER — IOHEXOL 350 MG/ML SOLN
75.0000 mL | Freq: Once | INTRAVENOUS | Status: AC | PRN
  Administered 2021-08-07: 75 mL via INTRAVENOUS

## 2021-08-07 MED ORDER — INSULIN ASPART 100 UNIT/ML IJ SOLN
7.0000 [IU] | Freq: Once | INTRAMUSCULAR | Status: AC
  Administered 2021-08-07: 7 [IU] via SUBCUTANEOUS

## 2021-08-07 MED ORDER — LISINOPRIL 10 MG PO TABS
10.0000 mg | ORAL_TABLET | Freq: Every day | ORAL | Status: DC
  Administered 2021-08-07 – 2021-08-08 (×2): 10 mg via ORAL
  Filled 2021-08-07 (×2): qty 1

## 2021-08-07 MED ORDER — AMLODIPINE BESYLATE 5 MG PO TABS
5.0000 mg | ORAL_TABLET | Freq: Every day | ORAL | Status: DC
  Administered 2021-08-07 – 2021-08-08 (×2): 5 mg via ORAL
  Filled 2021-08-07 (×2): qty 1

## 2021-08-07 NOTE — Evaluation (Signed)
Physical Therapy Evaluation Patient Details Name: Alexandria Webster MRN: [id number] DOB: 1965-11-21 Today's Date: 08/07/2021  History of Present Illness  Alexandria Webster is a 56 year old female with type 2 diabetes mellitus, COPD, GERD, hypertension, hyperlipidemia, hypothyroidism, PVD status post aortobifemoral bypass surgery had been in her usual state of health when she got up to go to the bathroom this morning she passed out.  She was not even aware that she had passed out.  Her son found her on the floor and she thought that she was still in the bed.  She was confused.  She hit her right shoulder and right wrist.  She hit her head.  She has no history of epilepsy.  She has no prior history of syncope other than a small episode she had years ago when she was dehydrated and was found to be hypotensive at that time.  She denies having chest pain.  She only complains of right shoulder and wrist pain and left rib pain.  She was evaluated in the ED noted to have an elevated creatinine, low potassium and magnesium and WBC of 11.9.  She reports that she had approximately a 2-week period of poor oral intake due to nausea and vomiting.  She is feeling better now from that standpoint but was told by her PCP that she had a bump in her creatinine when it was tested a couple of days ago.  She is being admitted for sudden syncopal episode and metabolic derangements.    Clinical Impression  Patient presents supine in bed, she is awake, alert and cooperative. Patient reports no pain at rest, but notes that her RT hand and wrist bothers her occasionally. Patient is able to perform bed mobility Mod I. She demos good seated balance, but requires AD in standing and notes increased dizziness when standing. Patient performed several rounds of sit to stand at bedside, with marching in place using RW for UE support. Patient noting slight increase in dizziness during. Monitored SpO2 throughout session. O2 level sitting at 94%  at rest, but dropped as low as 86% following activity. Educated patient on pursed lip breathing and performed several rounds. O2 level returns to baseline within about 2 minutes. Monitored patient symptoms throughout session. She was able to walk to doorway and back to bed using RW, but with increased dizziness. Symptoms resolved once resting. At this time, patient appears to be functioning well, but is most limited by dizziness and was admitted for syncope. Patient should be fine to return home with family support/ supervision, pending these symptoms improve during her stay. Patient left in bed, phone and call bell in reach. Patient will benefit from continued physical therapy in hospital and recommended venue below to increase strength, balance, endurance for safe ADLs and gait.         Recommendations for follow up therapy are one component of a multi-disciplinary discharge planning process, led by the attending physician.  Recommendations may be updated based on patient status, additional functional criteria and insurance authorization.  Follow Up Recommendations No PT follow up    Assistance Recommended at Discharge Intermittent Supervision/Assistance  Patient can return home with the following  A little help with walking and/or transfers;Help with stairs or ramp for entrance;Assistance with cooking/housework;A little help with bathing/dressing/bathroom    Equipment Recommendations None recommended by PT  Recommendations for Other Services       Functional Status Assessment Patient has had a recent decline in their functional status and demonstrates the ability  to make significant improvements in function in a reasonable and predictable amount of time.     Precautions / Restrictions Precautions Precautions: Fall Restrictions Weight Bearing Restrictions: No      Mobility  Bed Mobility Overal bed mobility: Modified Independent             General bed mobility comments: uses  UEs, bed rails    Transfers Overall transfer level: Modified independent Equipment used: Rolling walker (2 wheels)                    Ambulation/Gait Ambulation/Gait assistance: Supervision Gait Distance (Feet): 12 Feet Assistive device: Rolling walker (2 wheels) Gait Pattern/deviations: Trunk flexed, Decreased stride length       General Gait Details: slow cadence, wide turns, decreased stride, no LOB  Stairs            Wheelchair Mobility    Modified Rankin (Stroke Patients Only)       Balance Overall balance assessment: Needs assistance Sitting-balance support: No upper extremity supported, Feet supported Sitting balance-Leahy Scale: Fair Sitting balance - Comments: sitting at EOB   Standing balance support: Bilateral upper extremity supported Standing balance-Leahy Scale: Fair Standing balance comment: Standing at EOB with RW                             Pertinent Vitals/Pain Pain Assessment Pain Assessment: No/denies pain    Home Living Family/patient expects to be discharged to:: Private residence Living Arrangements: Spouse/significant other;Children Available Help at Discharge: Family;Available PRN/intermittently Type of Home: House Home Access: Stairs to enter Entrance Stairs-Rails: Can reach both Entrance Stairs-Number of Steps: 3   Home Layout: One level Home Equipment: Grab bars - tub/shower;Rolling Walker (2 wheels);Cane - single point      Prior Function Prior Level of Function : Independent/Modified Independent             Mobility Comments: Reports PLOF no AD, but husband and daughter do grocery shopping       Hand Dominance        Extremity/Trunk Assessment   Upper Extremity Assessment Upper Extremity Assessment: Defer to OT evaluation    Lower Extremity Assessment Lower Extremity Assessment: Overall WFL for tasks assessed       Communication   Communication: No difficulties  Cognition  Arousal/Alertness: Awake/alert Behavior During Therapy: WFL for tasks assessed/performed Overall Cognitive Status: Within Functional Limits for tasks assessed                                          General Comments      Exercises     Assessment/Plan    PT Assessment Patient does not need any further PT services  PT Problem List         PT Treatment Interventions      PT Goals (Current goals can be found in the Care Plan section)  Acute Rehab PT Goals Patient Stated Goal: Return home PT Goal Formulation: With patient Time For Goal Achievement: 08/21/21 Potential to Achieve Goals: Good    Frequency       Co-evaluation               AM-PAC PT "6 Clicks" Mobility  Outcome Measure Help needed turning from your back to your side while in a flat bed without using bedrails?: A Little Help  needed moving from lying on your back to sitting on the side of a flat bed without using bedrails?: A Little Help needed moving to and from a bed to a chair (including a wheelchair)?: None Help needed standing up from a chair using your arms (e.g., wheelchair or bedside chair)?: None Help needed to walk in hospital room?: A Little Help needed climbing 3-5 steps with a railing? : A Little 6 Click Score: 20    End of Session Equipment Utilized During Treatment: Gait belt Activity Tolerance: Patient tolerated treatment well Patient left: in bed;with call bell/phone within reach Nurse Communication: Mobility status PT Visit Diagnosis: Other abnormalities of gait and mobility (R26.89);Muscle weakness (generalized) (M62.81)    Time: 8341-9622 PT Time Calculation (min) (ACUTE ONLY): 34 min   Charges:   PT Evaluation $PT Eval Low Complexity: 1 Low PT Treatments $Therapeutic Activity: 8-22 mins      2:44 PM, 08/07/21 Josue Hector PT DPT  Physical Therapist with [facility]  [phone]

## 2021-08-07 NOTE — ED Provider Notes (Signed)
[facility] Provider Note   CSN: [id number] Arrival date & time: 08/06/21  0263     History  Chief Complaint  Patient presents with   Loss of Consciousness   Fall   Arm Injury    Alexandria Webster is a 56 y.o. female.  HPI     Alexandria Webster is a 56 year old female with type 2 diabetes mellitus, COPD, GERD, hypertension, hyperlipidemia, hypothyroidism, PVD status post aortobifemoral bypass surgery who comes into the ER after she fainted.  Patient indicates that she had gotten up to go somewhere, and found herself on the floor.  She thinks her son witnessed syncopal episode.  Patient denies any preceding chest pain, palpitations, shortness of breath.  She has been having some nausea and vomiting and has reduced p.o. intake over the last several days.  As a result, she has been having some dizziness and lightheadedness.  Patient denies any history of seizures and denies any incontinence with this episode.  From the fall itself, she is having pain over her face, head.   Home Medications Prior to Admission medications   Medication Sig Start Date End Date Taking? Authorizing Provider  aspirin 325 MG tablet Take 325 mg by mouth daily.   Yes [provider]  cetirizine (ZYRTEC) 10 MG tablet Take 10 mg by mouth daily. 07/23/21  Yes [provider]  Cholecalciferol (VITAMIN D3) 1000 units CAPS Take 1,000 Units by mouth 2 (two) times a day.    Yes [provider]  esomeprazole (NEXIUM) 20 MG capsule Take 40 mg by mouth daily.   Yes [provider]  furosemide (LASIX) 20 MG tablet Take 1 tablet (20 mg total) by mouth daily. 10/12/18  Yes Fredia Sorrow, MD  levothyroxine (SYNTHROID, LEVOTHROID) 25 MCG tablet Take 25 mcg by mouth daily before breakfast.   Yes [provider]  lisinopril (ZESTRIL) 20 MG tablet Take 20 mg by mouth daily. 07/23/21  Yes [provider]  lovastatin (MEVACOR) 20 MG tablet Take 20 mg  by mouth at bedtime.   Yes [provider]  meloxicam (MOBIC) 7.5 MG tablet Take 7.5 mg by mouth 2 (two) times daily as needed for pain. 07/29/21  Yes [provider]  metFORMIN (GLUCOPHAGE) 500 MG tablet Take 500 mg by mouth 2 (two) times daily. 08/05/21  Yes [provider]  traZODone (DESYREL) 100 MG tablet Take 100 mg by mouth daily. 07/29/21  Yes [provider]  TRULICITY 1.5 ZC/5.8IF SOPN Inject 1.5 mg into the skin once a week. Sunday 07/29/21  Yes [provider]  fluticasone (FLONASE) 50 MCG/ACT nasal spray Place 1 spray into both nostrils daily for 14 days. 11/28/19 12/12/19  Avegno, Darrelyn Hillock, FNP  glucose blood (ACCU-CHEK AVIVA) test strip Test glucose 4 times a day 09/28/15   Cassandria Anger, MD  Insulin Pen Needle (B-D UF III MINI PEN NEEDLES) 31G X 5 MM MISC Use qhs 09/17/15   Cassandria Anger, MD      Allergies    Patient has no known allergies.    Review of Systems   Review of Systems  Physical Exam Updated Vital Signs BP 140/69 (BP Location: Left Arm)   Pulse 77   Temp 98.4 F (36.9 C) (Oral)   Resp 20   Ht '5\' 1"'$  (1.549 m)   Wt 62.9 kg   LMP 01/21/2012   SpO2 97%   BMI 26.20 kg/m  Physical Exam Vitals and nursing note reviewed.  Constitutional:      Appearance: She is well-developed.  HENT:     Head: Atraumatic.  Eyes:     Extraocular Movements: Extraocular movements intact.     Pupils: Pupils are equal, round, and reactive to light.  Neck:     Comments: No midline c-spine tenderness, pt able to turn head to 45 degrees bilaterally without any pain and able to flex neck to the chest and extend without any pain or neurologic symptoms.  Cardiovascular:     Rate and Rhythm: Normal rate.  Pulmonary:     Effort: Pulmonary effort is normal.  Musculoskeletal:        General: No swelling or deformity.     Cervical back: Normal range of motion and neck supple.  Skin:    General: Skin is warm and dry.      Comments: Bruising to the left jaw  Neurological:     Mental Status: She is alert and oriented to person, place, and time.    ED Results / Procedures / Treatments   Labs (all labs ordered are listed, but only abnormal results are displayed) Labs Reviewed  BASIC METABOLIC PANEL - Abnormal; Notable for the following components:      Result Value   Potassium 3.3 (*)    Glucose, Bld 155 (*)    BUN 25 (*)    Creatinine, Ser 1.77 (*)    GFR, Estimated 33 (*)    All other components within normal limits  CBC - Abnormal; Notable for the following components:   WBC 11.9 (*)    All other components within normal limits  MAGNESIUM - Abnormal; Notable for the following components:   Magnesium 1.3 (*)    All other components within normal limits  HEMOGLOBIN A1C - Abnormal; Notable for the following components:   Hgb A1c MFr Bld 6.5 (*)    All other components within normal limits  CBC - Abnormal; Notable for the following components:   WBC 12.0 (*)    All other components within normal limits  CREATININE, SERUM - Abnormal; Notable for the following components:   Creatinine, Ser 1.61 (*)    GFR, Estimated 37 (*)    All other components within normal limits  BASIC METABOLIC PANEL - Abnormal; Notable for the following components:   Glucose, Bld 117 (*)    Creatinine, Ser 1.10 (*)    GFR, Estimated 59 (*)    All other components within normal limits  CBC WITH DIFFERENTIAL/PLATELET - Abnormal; Notable for the following components:   RBC 3.43 (*)    Hemoglobin 11.5 (*)    HCT 32.4 (*)    All other components within normal limits  GLUCOSE, CAPILLARY - Abnormal; Notable for the following components:   Glucose-Capillary 103 (*)    All other components within normal limits  GLUCOSE, CAPILLARY - Abnormal; Notable for the following components:   Glucose-Capillary 101 (*)    All other components within normal limits  HIV ANTIBODY (ROUTINE TESTING W REFLEX)  TSH  MAGNESIUM  LIPID PANEL   GLUCOSE, CAPILLARY  URINALYSIS, ROUTINE W REFLEX MICROSCOPIC    EKG EKG Interpretation  Date/Time:  Friday Aug 06 2021 09:19:02 EDT Ventricular Rate:  88 PR Interval:  150 QRS Duration: 83 QT Interval:  373 QTC Calculation: 452 R Axis:   59 Text Interpretation: Sinus rhythm Low voltage, precordial leads No acute changes No significant change since last tracing Confirmed by Varney Biles [phone]) on 08/06/2021 10:44:37 AM  Radiology DG Shoulder Right  Result Date: 08/06/2021 CLINICAL DATA:  Shoulder pain post fall EXAM: RIGHT SHOULDER - 2+ VIEW COMPARISON:  None Available. FINDINGS: No fracture or malalignment. Moderate AC joint degenerative change and mild glenohumeral degenerative change. IMPRESSION: No acute osseous abnormality Electronically Signed   By: Donavan Foil M.D.   On: 08/06/2021 17:38   DG Wrist 2 Views Right  Result Date: 08/06/2021 CLINICAL DATA:  Fall, pain EXAM: RIGHT WRIST - 2 VIEW COMPARISON:  None Available. FINDINGS: There is no evidence of fracture or dislocation. The carpus is normally aligned. There is no evidence of arthropathy or other focal bone abnormality. Soft tissues are unremarkable. IMPRESSION: No fracture or dislocation of the right wrist. The carpus is normally aligned. Electronically Signed   By: Delanna Ahmadi M.D.   On: 08/06/2021 17:39   CT Head Wo Contrast  Result Date: 08/06/2021 CLINICAL DATA:  Facial trauma, blunt. Fall from standing position. Syncopal episode. Pain along the left side of the chin. EXAM: CT HEAD WITHOUT CONTRAST CT MAXILLOFACIAL WITHOUT CONTRAST TECHNIQUE: Multidetector CT imaging of the head and maxillofacial structures were performed using the standard protocol without intravenous contrast. Multiplanar CT image reconstructions of the maxillofacial structures were also generated. RADIATION DOSE REDUCTION: This exam was performed according to the departmental dose-optimization program which includes automated exposure control,  adjustment of the mA and/or kV according to patient size and/or use of iterative reconstruction technique. COMPARISON:  None Available. FINDINGS: CT HEAD FINDINGS Brain: No acute infarct, hemorrhage, or mass lesion is present. Mild white matter changes are present. A lacunar infarct in the left thalamus is likely remote. The ventricles are of normal size. No significant extraaxial fluid collection is present. Vascular: Atherosclerotic calcifications within the cavernous internal carotid arteries have increased over time. No hyperdense vessel is present. Skull: Calvarium is intact. No focal lytic or blastic lesions are present. A left parietal scalp lesion measures 18 mm in maximal axial dimension cephalo caudad measurement is increased from 10 mm to 20 mm. Peripheral calcification is noted. A smaller subcutaneous lesion with calcification is present in the right occipital scalp, and increased in size to now 7 mm. Other: None CT MAXILLOFACIAL FINDINGS Osseous: No fracture or mandibular dislocation. No destructive process. Orbits: Negative. No traumatic or inflammatory finding. Sinuses: The paranasal sinuses and mastoid air cells are clear. Soft tissues: Soft tissue swelling diffuse stranding present to the left of the horizontal ramus of the mandible without underlying fracture or foreign body. Muscles are intact. IMPRESSION: 1. Soft tissue swelling to the left of the horizontal ramus of the mandible without underlying fracture or foreign body. Consistent with edema and soft tissue hematoma. 2. No underlying fracture or dislocation of the mandible. 3. No acute intracranial abnormality. 4. Mild white matter disease is likely remote. 5. Remote lacunar infarct in the left thalamus. 6. Enlarging subcutaneous lesions in the left parietal and right occipital scalp. These likely represent benign lesions such as sebaceous cysts. Subcutaneous lymph nodes are also considered. These areas should be amenable to direct  examination. Electronically Signed   By: San Morelle M.D.   On: 08/06/2021 11:51   MR BRAIN WO CONTRAST  Result Date: 08/06/2021 CLINICAL DATA:  Provided history: Neuro deficit, acute, stroke suspected; mental status change, unknown cause. Additional history provided: The and fall this morning. EXAM: MRI HEAD WITHOUT CONTRAST TECHNIQUE: Multiplanar, multiecho pulse sequences of the brain and surrounding structures were obtained without intravenous contrast. COMPARISON:  Prior head CT examinations 08/06/2021 and earlier. FINDINGS: Brain: No age advanced  or lobar predominant parenchymal atrophy. Chronic lacunar infarcts within the bilateral cerebral hemispheric white matter. Additional age advanced multifocal T2 FLAIR hyperintense signal abnormality within the cerebral white matter, nonspecific but likely also reflecting chronic small vessel ischemic disease given the patient's history of diabetes, hypertension and hypercholesterolemia. Chronic lacunar infarcts within the bilateral thalami. Small chronic infarcts within the bilateral cerebellar hemispheres. There is no acute infarct. No evidence of an intracranial mass. No chronic intracranial blood products. No extra-axial fluid collection. No midline shift. Vascular: Maintained flow voids within the proximal large arterial vessels. Skull and upper cervical spine: No focal suspicious marrow lesion. Sinuses/Orbits: No mass or acute finding within the imaged orbits. Minimal mucosal thickening within the bilateral ethmoid sinuses. Other: Left facial soft tissue swelling. Ovoid T2 intermediate to hypointense lesions measuring up to 20 x 9 mm within the right occipital and left temporal occipital scalp, nonspecific but likely benign and possibly reflecting sebaceous cysts. IMPRESSION: 1. No evidence of acute intracranial abnormality. 2. Age advanced chronic small vessel ischemic changes within the cerebral white matter, including chronic lacunar infarcts. 3.  Chronic lacunar infarcts within the bilateral thalami. 4. Small chronic infarcts within the bilateral cerebellar hemispheres. 5. Left facial soft tissue swelling. Electronically Signed   By: Kellie Simmering D.O.   On: 08/06/2021 13:43   EEG adult  Result Date: 08/07/2021 Lora Havens, MD     08/07/2021  7:44 AM Patient Name: Alexandria Webster MRN: [id number] Epilepsy Attending: Lora Havens Referring Physician/Provider: Murlean Iba, MD Date: 08/07/2021 Duration: 23.26 mins Patient history: 56yo F with syncope. EEG to evaluate for seizure Level of alertness: Awake AEDs during EEG study: None Technical aspects: This EEG study was done with scalp electrodes positioned according to the 10-20 International system of electrode placement. Electrical activity was acquired at a sampling rate of '500Hz'$  and reviewed with a high frequency filter of '70Hz'$  and a low frequency filter of '1Hz'$ . EEG data were recorded continuously and digitally stored. Description: The posterior dominant rhythm consists of 8-'9Hz'$  activity of moderate voltage (25-35 uV) seen predominantly in posterior head regions, symmetric and reactive to eye opening and eye closing. Physiologic photic driving was seen during photic stimulation.  Hyperventilation was not performed.   IMPRESSION: This study is within normal limits. No seizures or epileptiform discharges were seen throughout the recording. Lora Havens   ECHOCARDIOGRAM COMPLETE  Result Date: 08/06/2021    ECHOCARDIOGRAM REPORT   Patient Name:   Alexandria Webster Date of Exam: 08/06/2021 Medical Rec #:  [id number]        Height:       61.0 in Accession #:    [id number]       Weight:       137.1 lb Date of Birth:  December 13, 1965        BSA:          1.609 m Patient Age:    5 years         BP:           144/80 mmHg Patient Gender: F                HR:           90 bpm. Exam Location:  [facility] Procedure: 2D Echo, Cardiac Doppler and Color Doppler Indications:    R55 Syncope  History:         Patient has prior history of Echocardiogram examinations, most  recent 09/06/2018. COPD, Signs/Symptoms:Syncope; Risk                 Factors:Hypertension, Diabetes and GERD.  Sonographer:    Bernadene Person RDCS Referring Phys: [facility]  1. Left ventricular ejection fraction, by estimation, is 65 to 70%. Left ventricular ejection fraction by 2D MOD biplane is 68.5 %. The left ventricle has normal function. The left ventricle has no regional wall motion abnormalities. Left ventricular diastolic parameters are consistent with Grade I diastolic dysfunction (impaired relaxation).  2. Right ventricular systolic function is normal. The right ventricular size is normal. Tricuspid regurgitation signal is inadequate for assessing PA pressure.  3. The mitral valve is grossly normal. No evidence of mitral valve regurgitation.  4. The aortic valve is tricuspid. Aortic valve regurgitation is not visualized.  5. The inferior vena cava is normal in size with greater than 50% respiratory variability, suggesting right atrial pressure of 3 mmHg. Comparison(s): Changes from prior study are noted. 09/06/2018: LVEF 60-65%, grade 1 DD. FINDINGS  Left Ventricle: Left ventricular ejection fraction, by estimation, is 65 to 70%. Left ventricular ejection fraction by 2D MOD biplane is 68.5 %. The left ventricle has normal function. The left ventricle has no regional wall motion abnormalities. The left ventricular internal cavity size was normal in size. There is no left ventricular hypertrophy. Left ventricular diastolic parameters are consistent with Grade I diastolic dysfunction (impaired relaxation). Indeterminate filling pressures. Right Ventricle: The right ventricular size is normal. No increase in right ventricular wall thickness. Right ventricular systolic function is normal. Tricuspid regurgitation signal is inadequate for assessing PA pressure. Left Atrium: Left atrial size was normal in size.  Right Atrium: Right atrial size was normal in size. Pericardium: There is no evidence of pericardial effusion. Mitral Valve: The mitral valve is grossly normal. No evidence of mitral valve regurgitation. Tricuspid Valve: The tricuspid valve is grossly normal. Tricuspid valve regurgitation is trivial. Aortic Valve: The aortic valve is tricuspid. Aortic valve regurgitation is not visualized. Pulmonic Valve: The pulmonic valve was normal in structure. Pulmonic valve regurgitation is not visualized. Aorta: The aortic root and ascending aorta are structurally normal, with no evidence of dilitation. Venous: The inferior vena cava is normal in size with greater than 50% respiratory variability, suggesting right atrial pressure of 3 mmHg. IAS/Shunts: No atrial level shunt detected by color flow Doppler.  LEFT VENTRICLE PLAX 2D                        Biplane EF (MOD) LVIDd:         3.80 cm         LV Biplane EF:   Left LVIDs:         2.60 cm                          ventricular LV PW:         0.90 cm                          ejection LV IVS:        0.80 cm                          fraction by LVOT diam:     2.10 cm  2D MOD LV SV:         41                               biplane is LV SV Index:   25                               68.5 %. LVOT Area:     3.46 cm                                Diastology                                LV e' medial:    6.49 cm/s LV Volumes (MOD)               LV E/e' medial:  10.2 LV vol d, MOD    54.4 ml       LV e' lateral:   9.57 cm/s A2C:                           LV E/e' lateral: 6.9 LV vol d, MOD    69.7 ml A4C: LV vol s, MOD    19.1 ml A2C: LV vol s, MOD    20.0 ml A4C: LV SV MOD A2C:   35.3 ml LV SV MOD A4C:   69.7 ml LV SV MOD BP:    43.2 ml RIGHT VENTRICLE RV S prime:     11.70 cm/s TAPSE (M-mode): 1.6 cm LEFT ATRIUM             Index        RIGHT ATRIUM           Index LA diam:        2.60 cm 1.62 cm/m   RA Area:     10.40 cm LA Vol (A2C):   21.5 ml 13.36  ml/m  RA Volume:   20.80 ml  12.93 ml/m LA Vol (A4C):   17.0 ml 10.57 ml/m LA Biplane Vol: 20.9 ml 12.99 ml/m  AORTIC VALVE LVOT Vmax:   73.00 cm/s LVOT Vmean:  48.000 cm/s LVOT VTI:    0.118 m  AORTA Ao Root diam: 3.10 cm MITRAL VALVE MV Area (PHT): 3.77 cm    SHUNTS MV Decel Time: 201 msec    Systemic VTI:  0.12 m MV E velocity: 66.40 cm/s  Systemic Diam: 2.10 cm MV A velocity: 95.50 cm/s MV E/A ratio:  0.70 Lyman Bishop MD Electronically signed by Lyman Bishop MD Signature Date/Time: 08/06/2021/4:48:55 PM    Final    CT Maxillofacial Wo Contrast  Result Date: 08/06/2021 CLINICAL DATA:  Facial trauma, blunt. Fall from standing position. Syncopal episode. Pain along the left side of the chin. EXAM: CT HEAD WITHOUT CONTRAST CT MAXILLOFACIAL WITHOUT CONTRAST TECHNIQUE: Multidetector CT imaging of the head and maxillofacial structures were performed using the standard protocol without intravenous contrast. Multiplanar CT image reconstructions of the maxillofacial structures were also generated. RADIATION DOSE REDUCTION: This exam was performed according to the departmental dose-optimization program which includes automated exposure control, adjustment of the mA and/or kV according to patient size and/or use of iterative reconstruction technique. COMPARISON:  None Available. FINDINGS: CT  HEAD FINDINGS Brain: No acute infarct, hemorrhage, or mass lesion is present. Mild white matter changes are present. A lacunar infarct in the left thalamus is likely remote. The ventricles are of normal size. No significant extraaxial fluid collection is present. Vascular: Atherosclerotic calcifications within the cavernous internal carotid arteries have increased over time. No hyperdense vessel is present. Skull: Calvarium is intact. No focal lytic or blastic lesions are present. A left parietal scalp lesion measures 18 mm in maximal axial dimension cephalo caudad measurement is increased from 10 mm to 20 mm. Peripheral  calcification is noted. A smaller subcutaneous lesion with calcification is present in the right occipital scalp, and increased in size to now 7 mm. Other: None CT MAXILLOFACIAL FINDINGS Osseous: No fracture or mandibular dislocation. No destructive process. Orbits: Negative. No traumatic or inflammatory finding. Sinuses: The paranasal sinuses and mastoid air cells are clear. Soft tissues: Soft tissue swelling diffuse stranding present to the left of the horizontal ramus of the mandible without underlying fracture or foreign body. Muscles are intact. IMPRESSION: 1. Soft tissue swelling to the left of the horizontal ramus of the mandible without underlying fracture or foreign body. Consistent with edema and soft tissue hematoma. 2. No underlying fracture or dislocation of the mandible. 3. No acute intracranial abnormality. 4. Mild white matter disease is likely remote. 5. Remote lacunar infarct in the left thalamus. 6. Enlarging subcutaneous lesions in the left parietal and right occipital scalp. These likely represent benign lesions such as sebaceous cysts. Subcutaneous lymph nodes are also considered. These areas should be amenable to direct examination. Electronically Signed   By: San Morelle M.D.   On: 08/06/2021 11:51    Procedures Procedures    Medications Ordered in ED Medications  aspirin tablet 325 mg (has no administration in time range)  pravastatin (PRAVACHOL) tablet 20 mg (20 mg Oral Given 08/06/21 1745)  traZODone (DESYREL) tablet 100 mg (100 mg Oral Given 08/06/21 2053)  levothyroxine (SYNTHROID) tablet 25 mcg (25 mcg Oral Given 08/07/21 0528)  loratadine (CLARITIN) tablet 10 mg (has no administration in time range)  insulin aspart (novoLOG) injection 0-9 Units (0 Units Subcutaneous Not Given 08/07/21 0749)  enoxaparin (LOVENOX) injection 30 mg (30 mg Subcutaneous Given 08/06/21 1507)  lactated ringers infusion ( Intravenous New Bag/Given 08/07/21 0336)  acetaminophen (TYLENOL)  tablet 650 mg (has no administration in time range)    Or  acetaminophen (TYLENOL) suppository 650 mg (has no administration in time range)  oxyCODONE (Oxy IR/ROXICODONE) immediate release tablet 5 mg (5 mg Oral Given 08/07/21 0528)  fentaNYL (SUBLIMAZE) injection 12.5 mcg (has no administration in time range)  bisacodyl (DULCOLAX) EC tablet 5 mg (has no administration in time range)  ondansetron (ZOFRAN) tablet 4 mg (has no administration in time range)    Or  ondansetron (ZOFRAN) injection 4 mg (has no administration in time range)  hydrALAZINE (APRESOLINE) injection 5 mg (has no administration in time range)  pantoprazole (PROTONIX) EC tablet 40 mg (40 mg Oral Given 08/06/21 2053)  amLODipine (NORVASC) tablet 5 mg (has no administration in time range)  lisinopril (ZESTRIL) tablet 10 mg (has no administration in time range)  magnesium sulfate IVPB 2 g 50 mL (0 g Intravenous Stopped 08/06/21 1436)  potassium chloride SA (KLOR-CON M) CR tablet 40 mEq (40 mEq Oral Given 08/06/21 1250)  potassium chloride 10 mEq in 100 mL IVPB ( Intravenous Stopped 08/06/21 1914)  magnesium sulfate IVPB 2 g 50 mL (0 g Intravenous Stopped 08/06/21 2113)  ED Course/ Medical Decision Making/ A&P                           Medical Decision Making Amount and/or Complexity of Data Reviewed Labs: ordered. Radiology: ordered.  Risk Prescription drug management. Decision regarding hospitalization.   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  Patient also indicates that she has had nausea and vomiting for the last couple of weeks and has had reduced p.o. intake which further complicates the presenting complaint. The complaint involves an extensive differential diagnosis and also carries with it a high risk of complications and morbidity.    The differential diagnosis includes  : Orthostatic hypotension, orthostatic syncope, AKI, severe electrolyte normality, arrhythmia, seizure, intracranial hemorrhage.  Patient's exam consistent with bruising over her face.  Neurologic exam is reassuring.  She has no gross deformity but she is having discomfort over the shoulder and wrist area along with her head, face.  C-spine was cleared clinically, therefore we will not get CT C-spine.  The initial plan is to place patient on cardiac monitoring and get basic blood work and appropriate imaging. Her abdominal exam is reassuring.  I am not sure why she is having vomiting at this time.  Uremia is a possibility.   Additional history obtained: Additional history obtained from family  Independent labs interpretation:  The following labs were independently interpreted: Patient's lab indicates no anemia, elevation in creatinine to 1.77 and mild hypokalemia.  She also has mild hypomagnesemia.  EKG is reassuring from intervals perspective.  Patient has been placed on cardiac telemetry.  I have not witnessed or interpreted any concerning arrhythmia.  Independent visualization of imaging: - I independently visualized the following imaging with scope of interpretation limited to determining acute life threatening conditions related to emergency care: CT scan of the brain and face, which revealed no clear jaw fracture, brain bleed  Treatment and Reassessment: Results of the ER work-up discussed with the patient.  Admitting team has ordered additional x-rays. Patient will be admitted to the hospital for syncope.   Final Clinical Impression(s) / ED Diagnoses Final diagnoses:  AKI (acute kidney injury) ([place])  Syncope and collapse    Rx / DC Orders ED Discharge Orders     None         Varney Biles, MD 08/07/21 [phone]

## 2021-08-07 NOTE — Plan of Care (Signed)
  Problem: Acute Rehab PT Goals(only PT should resolve) Goal: Pt Will Ambulate Flowsheets (Taken 08/07/2021 1445) Pt will Ambulate:  > 125 feet  with least restrictive assistive device  with modified independence Goal: Pt Will Go Up/Down Stairs Flowsheets (Taken 08/07/2021 1445) Pt will Go Up / Down Stairs:  3-5 stairs  with modified independence  with rail(s) Goal: Pt/caregiver will Perform Home Exercise Program Flowsheets (Taken 08/07/2021 1445) Pt/caregiver will Perform Home Exercise Program:  For increased strengthening  Independently   2:46 PM, 08/07/21 Josue Hector PT DPT  Physical Therapist with [facility]  [phone]

## 2021-08-07 NOTE — Procedures (Signed)
Patient Name: Alexandria Webster  MRN: [id number]  Epilepsy Attending: Lora Havens  Referring Physician/Provider: Murlean Iba, MD Date: 08/07/2021  Duration: 23.26 mins  Patient history: 57yo F with syncope. EEG to evaluate for seizure  Level of alertness: Awake  AEDs during EEG study: None  Technical aspects: This EEG study was done with scalp electrodes positioned according to the 10-20 International system of electrode placement. Electrical activity was acquired at a sampling rate of '500Hz'$  and reviewed with a high frequency filter of '70Hz'$  and a low frequency filter of '1Hz'$ . EEG data were recorded continuously and digitally stored.   Description: The posterior dominant rhythm consists of 8-'9Hz'$  activity of moderate voltage (25-35 uV) seen predominantly in posterior head regions, symmetric and reactive to eye opening and eye closing. Physiologic photic driving was seen during photic stimulation.  Hyperventilation was not performed.     IMPRESSION: This study is within normal limits. No seizures or epileptiform discharges were seen throughout the recording.  Malcom Selmer Barbra Sarks

## 2021-08-07 NOTE — Progress Notes (Signed)
PROGRESS NOTE   Alexandria Webster  PYP:[id number] DOB: 1966-02-11 DOA: 08/06/2021 PCP: Jake Samples, PA-C   Chief Complaint  Patient presents with   Loss of Consciousness   Fall   Arm Injury   Level of care: Telemetry  Brief Admission History:  56 year old female with type 2 diabetes mellitus, COPD, GERD, hypertension, hyperlipidemia, hypothyroidism, PVD status post aortobifemoral bypass surgery had been in her usual state of health when she got up to go to the bathroom this morning she passed out.  She was not even aware that she had passed out.  Her son found her on the floor and she thought that she was still in the bed.  She was confused.  She hit her right shoulder and right wrist.  She hit her head.  She has no history of epilepsy.  She has no prior history of syncope other than a small episode she had years ago when she was dehydrated and was found to be hypotensive at that time.  She denies having chest pain.  She only complains of right shoulder and wrist pain and left rib pain.  She was evaluated in the ED noted to have an elevated creatinine, low potassium and magnesium and WBC of 11.9.  She reports that she had approximately a 2-week period of poor oral intake due to nausea and vomiting.  She is feeling better now from that standpoint but was told by her PCP that she had a bump in her creatinine when it was tested a couple of days ago.  She is being admitted for sudden syncopal episode and metabolic derangements.     Assessment and Plan: * Syncope and collapse -- likely metabolic cause given dehydration, AKI, hypotension -- work up with 2D echo, carotid dopplers, MRI brain, EEG, PT/OT evaluation: tests have been reassuring and unrevealing.  Still waiting on carotid doppler studies.   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[place]) -- pt is s/p aortobifemoral bypass   Status post aortobifemoral bypass surgery -- follow up with vascular surgery   Hypokalemia -- replace magnesium and then replace potassium -- REPLETED   Constipation -- laxatives ordered as needed   PVD (peripheral vascular disease) ([place]) -- stable, follow up with vascular surgery for surveillance   Type 2 diabetes mellitus ([place]) -- continue SSI coverage with frequent CBG monitoring  CBG (last 3)  Recent Labs    08/07/21 0331 08/07/21 0717 08/07/21 1124  GLUCAP 80 101* 153*     Leukocytosis -- possibly reactive given recent fall -- no s/s of infection found but still awaiting UA.   -- RESOLVED WBC within normal limits    Essential hypertension, benign -- BPs initially were soft but rebounded with hydration, resume home BP meds   DVT prophylaxis:  Code Status:  Family Communication:  Disposition: Status is: Observation    Consultants:   Procedures:   Antimicrobials:    Subjective: Pt reports that she is less stomach upset, no more falls.  No emesis.   Objective: Vitals:   08/06/21  2010 08/07/21 0520 08/07/21 0549 08/07/21 0802  BP: (!) 157/71  (!) 145/127 140/69  Pulse: 90  80 77  Resp: '18  18 20  '$ Temp: 99.6 F (37.6 C)  98.4 F (36.9 C)   TempSrc: Oral  Oral   SpO2: 98%  97%   Weight:  62.9 kg    Height:        Intake/Output Summary (Last 24 hours) at 08/07/2021 1207 Last data filed at 08/07/2021 0900 Gross per 24 hour  Intake 1867.44 ml  Output 800 ml  Net 1067.44 ml   Filed Weights   08/06/21 1440 08/06/21 1831 08/07/21 0520  Weight: 62.2 kg 61.5 kg 62.9 kg   Examination:  General exam:  Appears calm and comfortable  Respiratory system: Clear to auscultation. Respiratory effort normal. Cardiovascular system: normal S1 & S2 heard. No JVD, murmurs, rubs, gallops or clicks. No pedal edema. Gastrointestinal system: Abdomen is nondistended, soft and nontender. No organomegaly or masses felt. Normal bowel sounds heard. Central nervous system: Alert and oriented. No focal neurological deficits. Extremities: Symmetric 5 x 5 power. Skin: No rashes, lesions or ulcers. Psychiatry: Judgement and insight appear normal. Mood & affect appropriate.   Data Reviewed: I have personally reviewed following labs and imaging studies  CBC: Recent Labs  Lab 08/06/21 1004 08/06/21 1550 08/07/21 0613  WBC 11.9* 12.0* 6.5  NEUTROABS  --   --  3.7  HGB 14.0 13.5 11.5*  HCT 39.3 38.2 32.4*  MCV 94.0 93.4 94.5  PLT 169 178 528    Basic Metabolic Panel: Recent Labs  Lab 08/06/21 1004 08/06/21 1044 08/06/21 1550 08/07/21 0613  NA 140  --   --  139  K 3.3*  --   --  3.9  CL 100  --   --  107  CO2 27  --   --  27  GLUCOSE 155*  --   --  117*  BUN 25*  --   --  20  CREATININE 1.77*  --  1.61* 1.10*  CALCIUM 9.9  --   --  8.9  MG  --  1.3*  --  2.1    CBG: Recent Labs  Lab 08/06/21 2115 08/07/21 0331 08/07/21 0717 08/07/21 1124  GLUCAP 103* 80 101* 153*    No results found for this or any previous visit (from the past 240 hour(s)).   Radiology Studies: DG Shoulder Right  Result Date: 08/06/2021 CLINICAL DATA:  Shoulder pain post fall EXAM: RIGHT SHOULDER - 2+ VIEW COMPARISON:  None Available. FINDINGS: No fracture or malalignment. Moderate AC joint degenerative change and mild glenohumeral degenerative change. IMPRESSION: No acute osseous abnormality Electronically Signed   By: Donavan Foil M.D.   On: 08/06/2021 17:38   DG Wrist 2 Views Right  Result Date: 08/06/2021 CLINICAL DATA:  Fall, pain EXAM: RIGHT WRIST - 2 VIEW COMPARISON:  None Available. FINDINGS: There is no  evidence of fracture or dislocation. The carpus is normally aligned. There is no evidence of arthropathy or other focal bone abnormality. Soft tissues are unremarkable. IMPRESSION: No fracture or dislocation of the right wrist. The carpus is normally aligned. Electronically Signed   By: Delanna Ahmadi M.D.   On: 08/06/2021 17:39   CT Head Wo Contrast  Result Date: 08/06/2021 CLINICAL DATA:  Facial trauma, blunt. Fall from standing position. Syncopal episode. Pain along the left side of the chin. EXAM: CT HEAD WITHOUT CONTRAST CT MAXILLOFACIAL WITHOUT CONTRAST TECHNIQUE: Multidetector CT imaging of the head and  maxillofacial structures were performed using the standard protocol without intravenous contrast. Multiplanar CT image reconstructions of the maxillofacial structures were also generated. RADIATION DOSE REDUCTION: This exam was performed according to the departmental dose-optimization program which includes automated exposure control, adjustment of the mA and/or kV according to patient size and/or use of iterative reconstruction technique. COMPARISON:  None Available. FINDINGS: CT HEAD FINDINGS Brain: No acute infarct, hemorrhage, or mass lesion is present. Mild white matter changes are present. A lacunar infarct in the left thalamus is likely remote. The ventricles are of normal size. No significant extraaxial fluid collection is present. Vascular: Atherosclerotic calcifications within the cavernous internal carotid arteries have increased over time. No hyperdense vessel is present. Skull: Calvarium is intact. No focal lytic or blastic lesions are present. A left parietal scalp lesion measures 18 mm in maximal axial dimension cephalo caudad measurement is increased from 10 mm to 20 mm. Peripheral calcification is noted. A smaller subcutaneous lesion with calcification is present in the right occipital scalp, and increased in size to now 7 mm. Other: None CT MAXILLOFACIAL FINDINGS Osseous: No fracture or  mandibular dislocation. No destructive process. Orbits: Negative. No traumatic or inflammatory finding. Sinuses: The paranasal sinuses and mastoid air cells are clear. Soft tissues: Soft tissue swelling diffuse stranding present to the left of the horizontal ramus of the mandible without underlying fracture or foreign body. Muscles are intact. IMPRESSION: 1. Soft tissue swelling to the left of the horizontal ramus of the mandible without underlying fracture or foreign body. Consistent with edema and soft tissue hematoma. 2. No underlying fracture or dislocation of the mandible. 3. No acute intracranial abnormality. 4. Mild white matter disease is likely remote. 5. Remote lacunar infarct in the left thalamus. 6. Enlarging subcutaneous lesions in the left parietal and right occipital scalp. These likely represent benign lesions such as sebaceous cysts. Subcutaneous lymph nodes are also considered. These areas should be amenable to direct examination. Electronically Signed   By: San Morelle M.D.   On: 08/06/2021 11:51   MR BRAIN WO CONTRAST  Result Date: 08/06/2021 CLINICAL DATA:  Provided history: Neuro deficit, acute, stroke suspected; mental status change, unknown cause. Additional history provided: The and fall this morning. EXAM: MRI HEAD WITHOUT CONTRAST TECHNIQUE: Multiplanar, multiecho pulse sequences of the brain and surrounding structures were obtained without intravenous contrast. COMPARISON:  Prior head CT examinations 08/06/2021 and earlier. FINDINGS: Brain: No age advanced or lobar predominant parenchymal atrophy. Chronic lacunar infarcts within the bilateral cerebral hemispheric white matter. Additional age advanced multifocal T2 FLAIR hyperintense signal abnormality within the cerebral white matter, nonspecific but likely also reflecting chronic small vessel ischemic disease given the patient's history of diabetes, hypertension and hypercholesterolemia. Chronic lacunar infarcts within the  bilateral thalami. Small chronic infarcts within the bilateral cerebellar hemispheres. There is no acute infarct. No evidence of an intracranial mass. No chronic intracranial blood products. No extra-axial fluid collection. No midline shift. Vascular: Maintained flow voids within the proximal large arterial vessels. Skull and upper cervical spine: No focal suspicious marrow lesion. Sinuses/Orbits: No mass or acute finding within the imaged orbits. Minimal mucosal thickening within the bilateral ethmoid sinuses. Other: Left facial soft tissue swelling. Ovoid T2 intermediate to hypointense lesions measuring up to 20 x 9 mm within the right occipital and left temporal occipital scalp, nonspecific but likely benign and possibly reflecting sebaceous cysts. IMPRESSION: 1. No evidence of acute intracranial abnormality. 2. Age advanced chronic small vessel ischemic changes within the cerebral white matter, including chronic  lacunar infarcts. 3. Chronic lacunar infarcts within the bilateral thalami. 4. Small chronic infarcts within the bilateral cerebellar hemispheres. 5. Left facial soft tissue swelling. Electronically Signed   By: Kellie Simmering D.O.   On: 08/06/2021 13:43   EEG adult  Result Date: 08/07/2021 Lora Havens, MD     08/07/2021  7:44 AM Patient Name: Alexandria Webster MRN: [id number] Epilepsy Attending: Lora Havens Referring Physician/Provider: Murlean Iba, MD Date: 08/07/2021 Duration: 23.26 mins Patient history: 56yo F with syncope. EEG to evaluate for seizure Level of alertness: Awake AEDs during EEG study: None Technical aspects: This EEG study was done with scalp electrodes positioned according to the 10-20 International system of electrode placement. Electrical activity was acquired at a sampling rate of '500Hz'$  and reviewed with a high frequency filter of '70Hz'$  and a low frequency filter of '1Hz'$ . EEG data were recorded continuously and digitally stored. Description: The posterior dominant  rhythm consists of 8-'9Hz'$  activity of moderate voltage (25-35 uV) seen predominantly in posterior head regions, symmetric and reactive to eye opening and eye closing. Physiologic photic driving was seen during photic stimulation.  Hyperventilation was not performed.   IMPRESSION: This study is within normal limits. No seizures or epileptiform discharges were seen throughout the recording. Lora Havens   ECHOCARDIOGRAM COMPLETE  Result Date: 08/06/2021    ECHOCARDIOGRAM REPORT   Patient Name:   TYMIKA GRILLI Date of Exam: 08/06/2021 Medical Rec #:  [id number]        Height:       61.0 in Accession #:    [id number]       Weight:       137.1 lb Date of Birth:  03-18-1965        BSA:          1.609 m Patient Age:    21 years         BP:           144/80 mmHg Patient Gender: F                HR:           90 bpm. Exam Location:  [facility] Procedure: 2D Echo, Cardiac Doppler and Color Doppler Indications:    R55 Syncope  History:        Patient has prior history of Echocardiogram examinations, most                 recent 09/06/2018. COPD, Signs/Symptoms:Syncope; Risk                 Factors:Hypertension, Diabetes and GERD.  Sonographer:    Bernadene Person RDCS Referring Phys: [place]  1. Left ventricular ejection fraction, by estimation, is 65 to 70%. Left ventricular ejection fraction by 2D MOD biplane is 68.5 %. The left ventricle has normal function. The left ventricle has no regional wall motion abnormalities. Left ventricular diastolic parameters are consistent with Grade I diastolic dysfunction (impaired relaxation).  2. Right ventricular systolic function is normal. The right ventricular size is normal. Tricuspid regurgitation signal is inadequate for assessing PA pressure.  3. The mitral valve is grossly normal. No evidence of mitral valve regurgitation.  4. The aortic valve is tricuspid. Aortic valve regurgitation is not visualized.  5. The inferior vena cava is normal in size  with greater than 50% respiratory variability, suggesting right atrial pressure of 3 mmHg. Comparison(s): Changes from prior study are noted. 09/06/2018: LVEF 60-65%, grade  1 DD. FINDINGS  Left Ventricle: Left ventricular ejection fraction, by estimation, is 65 to 70%. Left ventricular ejection fraction by 2D MOD biplane is 68.5 %. The left ventricle has normal function. The left ventricle has no regional wall motion abnormalities. The left ventricular internal cavity size was normal in size. There is no left ventricular hypertrophy. Left ventricular diastolic parameters are consistent with Grade I diastolic dysfunction (impaired relaxation). Indeterminate filling pressures. Right Ventricle: The right ventricular size is normal. No increase in right ventricular wall thickness. Right ventricular systolic function is normal. Tricuspid regurgitation signal is inadequate for assessing PA pressure. Left Atrium: Left atrial size was normal in size. Right Atrium: Right atrial size was normal in size. Pericardium: There is no evidence of pericardial effusion. Mitral Valve: The mitral valve is grossly normal. No evidence of mitral valve regurgitation. Tricuspid Valve: The tricuspid valve is grossly normal. Tricuspid valve regurgitation is trivial. Aortic Valve: The aortic valve is tricuspid. Aortic valve regurgitation is not visualized. Pulmonic Valve: The pulmonic valve was normal in structure. Pulmonic valve regurgitation is not visualized. Aorta: The aortic root and ascending aorta are structurally normal, with no evidence of dilitation. Venous: The inferior vena cava is normal in size with greater than 50% respiratory variability, suggesting right atrial pressure of 3 mmHg. IAS/Shunts: No atrial level shunt detected by color flow Doppler.  LEFT VENTRICLE PLAX 2D                        Biplane EF (MOD) LVIDd:         3.80 cm         LV Biplane EF:   Left LVIDs:         2.60 cm                          ventricular LV PW:          0.90 cm                          ejection LV IVS:        0.80 cm                          fraction by LVOT diam:     2.10 cm                          2D MOD LV SV:         41                               biplane is LV SV Index:   25                               68.5 %. LVOT Area:     3.46 cm                                Diastology                                LV e' medial:    6.49 cm/s LV Volumes (MOD)  LV E/e' medial:  10.2 LV vol d, MOD    54.4 ml       LV e' lateral:   9.57 cm/s A2C:                           LV E/e' lateral: 6.9 LV vol d, MOD    69.7 ml A4C: LV vol s, MOD    19.1 ml A2C: LV vol s, MOD    20.0 ml A4C: LV SV MOD A2C:   35.3 ml LV SV MOD A4C:   69.7 ml LV SV MOD BP:    43.2 ml RIGHT VENTRICLE RV S prime:     11.70 cm/s TAPSE (M-mode): 1.6 cm LEFT ATRIUM             Index        RIGHT ATRIUM           Index LA diam:        2.60 cm 1.62 cm/m   RA Area:     10.40 cm LA Vol (A2C):   21.5 ml 13.36 ml/m  RA Volume:   20.80 ml  12.93 ml/m LA Vol (A4C):   17.0 ml 10.57 ml/m LA Biplane Vol: 20.9 ml 12.99 ml/m  AORTIC VALVE LVOT Vmax:   73.00 cm/s LVOT Vmean:  48.000 cm/s LVOT VTI:    0.118 m  AORTA Ao Root diam: 3.10 cm MITRAL VALVE MV Area (PHT): 3.77 cm    SHUNTS MV Decel Time: 201 msec    Systemic VTI:  0.12 m MV E velocity: 66.40 cm/s  Systemic Diam: 2.10 cm MV A velocity: 95.50 cm/s MV E/A ratio:  0.70 Lyman Bishop MD Electronically signed by Lyman Bishop MD Signature Date/Time: 08/06/2021/4:48:55 PM    Final    CT Maxillofacial Wo Contrast  Result Date: 08/06/2021 CLINICAL DATA:  Facial trauma, blunt. Fall from standing position. Syncopal episode. Pain along the left side of the chin. EXAM: CT HEAD WITHOUT CONTRAST CT MAXILLOFACIAL WITHOUT CONTRAST TECHNIQUE: Multidetector CT imaging of the head and maxillofacial structures were performed using the standard protocol without intravenous contrast. Multiplanar CT image reconstructions of the maxillofacial structures  were also generated. RADIATION DOSE REDUCTION: This exam was performed according to the departmental dose-optimization program which includes automated exposure control, adjustment of the mA and/or kV according to patient size and/or use of iterative reconstruction technique. COMPARISON:  None Available. FINDINGS: CT HEAD FINDINGS Brain: No acute infarct, hemorrhage, or mass lesion is present. Mild white matter changes are present. A lacunar infarct in the left thalamus is likely remote. The ventricles are of normal size. No significant extraaxial fluid collection is present. Vascular: Atherosclerotic calcifications within the cavernous internal carotid arteries have increased over time. No hyperdense vessel is present. Skull: Calvarium is intact. No focal lytic or blastic lesions are present. A left parietal scalp lesion measures 18 mm in maximal axial dimension cephalo caudad measurement is increased from 10 mm to 20 mm. Peripheral calcification is noted. A smaller subcutaneous lesion with calcification is present in the right occipital scalp, and increased in size to now 7 mm. Other: None CT MAXILLOFACIAL FINDINGS Osseous: No fracture or mandibular dislocation. No destructive process. Orbits: Negative. No traumatic or inflammatory finding. Sinuses: The paranasal sinuses and mastoid air cells are clear. Soft tissues: Soft tissue swelling diffuse stranding present to the left of the horizontal ramus of the mandible without underlying fracture or foreign body. Muscles are intact. IMPRESSION: 1.  Soft tissue swelling to the left of the horizontal ramus of the mandible without underlying fracture or foreign body. Consistent with edema and soft tissue hematoma. 2. No underlying fracture or dislocation of the mandible. 3. No acute intracranial abnormality. 4. Mild white matter disease is likely remote. 5. Remote lacunar infarct in the left thalamus. 6. Enlarging subcutaneous lesions in the left parietal and right  occipital scalp. These likely represent benign lesions such as sebaceous cysts. Subcutaneous lymph nodes are also considered. These areas should be amenable to direct examination. Electronically Signed   By: San Morelle M.D.   On: 08/06/2021 11:51    Scheduled Meds:  amLODipine  5 mg Oral Daily   aspirin  325 mg Oral Daily   enoxaparin (LOVENOX) injection  30 mg Subcutaneous Q24H   insulin aspart  0-9 Units Subcutaneous TID WC   levothyroxine  25 mcg Oral QAC breakfast   lisinopril  10 mg Oral Daily   loratadine  10 mg Oral Daily   pantoprazole  40 mg Oral BID   pravastatin  20 mg Oral q1800   traZODone  100 mg Oral QHS   Continuous Infusions:  lactated ringers 100 mL/hr at 08/07/21 0336     LOS: 0 days   Time spent: 35 mins  Roopa Graver Wynetta Emery, MD How to contact the [facility] Attending or Consulting provider [place] or covering provider during after hours [place], for this patient?  Check the care team in [facility] and look for a) attending/consulting [facility] provider listed and b) the [facility] team listed Log into [email or web address] and use 's universal password to access. If you do not have the password, please contact the hospital operator. Locate the [facility] provider you are looking for under [facility] and page to a number that you can be directly reached. If you still have difficulty reaching the provider, please page the [facility] (Director on Call) for the Hospitalists listed on amion for assistance.  08/07/2021, 12:07 PM

## 2021-08-08 DIAGNOSIS — J309 Allergic rhinitis, unspecified: Secondary | ICD-10-CM | POA: Diagnosis not present

## 2021-08-08 DIAGNOSIS — N179 Acute kidney failure, unspecified: Secondary | ICD-10-CM | POA: Diagnosis not present

## 2021-08-08 DIAGNOSIS — Z95828 Presence of other vascular implants and grafts: Secondary | ICD-10-CM | POA: Diagnosis not present

## 2021-08-08 DIAGNOSIS — I10 Essential (primary) hypertension: Secondary | ICD-10-CM | POA: Diagnosis not present

## 2021-08-08 DIAGNOSIS — M5030 Other cervical disc degeneration, unspecified cervical region: Secondary | ICD-10-CM

## 2021-08-08 DIAGNOSIS — I7409 Other arterial embolism and thrombosis of abdominal aorta: Secondary | ICD-10-CM | POA: Diagnosis not present

## 2021-08-08 DIAGNOSIS — E876 Hypokalemia: Secondary | ICD-10-CM | POA: Diagnosis not present

## 2021-08-08 DIAGNOSIS — I739 Peripheral vascular disease, unspecified: Secondary | ICD-10-CM

## 2021-08-08 DIAGNOSIS — E039 Hypothyroidism, unspecified: Secondary | ICD-10-CM | POA: Diagnosis not present

## 2021-08-08 LAB — BASIC METABOLIC PANEL
Anion gap: 3 — ABNORMAL LOW (ref 5–15)
BUN: 13 mg/dL (ref 6–20)
CO2: 27 mmol/L (ref 22–32)
Calcium: 8.9 mg/dL (ref 8.9–10.3)
Chloride: 111 mmol/L (ref 98–111)
Creatinine, Ser: 0.94 mg/dL (ref 0.44–1.00)
GFR, Estimated: >60 mL/min (ref >60)
Glucose, Bld: 140 mg/dL — ABNORMAL HIGH (ref 70–99)
Potassium: 4.4 mmol/L (ref 3.5–5.1)
Sodium: 141 mmol/L (ref 135–145)

## 2021-08-08 LAB — GLUCOSE, CAPILLARY
Glucose-Capillary: 130 mg/dL — ABNORMAL HIGH (ref 70–99)
Glucose-Capillary: 138 mg/dL — ABNORMAL HIGH (ref 70–99)
Glucose-Capillary: 142 mg/dL — ABNORMAL HIGH (ref 70–99)

## 2021-08-08 MED ORDER — ENOXAPARIN SODIUM 40 MG/0.4ML IJ SOSY: 40.0000 mg | PREFILLED_SYRINGE | INTRAMUSCULAR | Status: DC

## 2021-08-08 MED ORDER — FUROSEMIDE 20 MG PO TABS: 20.0000 mg | ORAL_TABLET | ORAL | 0 refills | Status: DC

## 2021-08-08 MED ORDER — MELOXICAM 7.5 MG PO TABS: 7.5000 mg | ORAL_TABLET | Freq: Every day | ORAL | Status: DC | PRN

## 2021-08-08 MED ORDER — METFORMIN HCL ER 500 MG PO TB24: 1000.0000 mg | ORAL_TABLET | Freq: Two times a day (BID) | ORAL | 1 refills | Status: DC

## 2021-08-08 NOTE — Progress Notes (Signed)
Nsg Discharge Note  Admit Date:  08/06/2021 Discharge date: 08/08/2021   DAVEIGH BATTY to be D/C'd Home per MD order.  AVS completed.  Copy for chart, and copy for patient signed, and dated. Patient/caregiver able to verbalize understanding.  Discharge Medication: Allergies as of 08/08/2021   No Known Allergies      Medication List     STOP taking these medications    metFORMIN 500 MG tablet Commonly known as: GLUCOPHAGE Replaced by: metFORMIN 500 MG 24 hr tablet       TAKE these medications    aspirin 325 MG tablet Take 325 mg by mouth daily.   cetirizine 10 MG tablet Commonly known as: ZYRTEC Take 10 mg by mouth daily.   esomeprazole 20 MG capsule Commonly known as: NEXIUM Take 40 mg by mouth daily.   fluticasone 50 MCG/ACT nasal spray Commonly known as: FLONASE Place 1 spray into both nostrils daily for 14 days.   furosemide 20 MG tablet Commonly known as: Lasix Take 1 tablet (20 mg total) by mouth every other day. Start taking on: August 12, 2021 What changed:  when to take this These instructions start on August 12, 2021. If you are unsure what to do until then, ask your doctor or other care provider.   glucose blood test strip Commonly known as: Accu-Chek Aviva Test glucose 4 times a day   Insulin Pen Needle 31G X 5 MM Misc Commonly known as: B-D UF III MINI PEN NEEDLES Use qhs   levothyroxine 25 MCG tablet Commonly known as: SYNTHROID Take 25 mcg by mouth daily before breakfast.   lisinopril 20 MG tablet Commonly known as: ZESTRIL Take 20 mg by mouth daily.   lovastatin 20 MG tablet Commonly known as: MEVACOR Take 20 mg by mouth at bedtime.   meloxicam 7.5 MG tablet Commonly known as: MOBIC Take 1 tablet (7.5 mg total) by mouth daily as needed for pain. What changed: when to take this   metFORMIN 500 MG 24 hr tablet Commonly known as: GLUCOPHAGE-XR Take 2 tablets (1,000 mg total) by mouth 2 (two) times daily with a meal. Replaces:  metFORMIN 500 MG tablet   traZODone 100 MG tablet Commonly known as: DESYREL Take 100 mg by mouth daily.   Trulicity 1.5 QA/8.3MH Sopn Generic drug: Dulaglutide Inject 1.5 mg into the skin once a week. Sunday   Vitamin D3 25 MCG (1000 UT) Caps Take 1,000 Units by mouth 2 (two) times a day.        Discharge Assessment: Vitals:   08/07/21 1926 08/08/21 0536  BP: (!) 108/52 (!) 152/88  Pulse: 79 77  Resp: 18   Temp: 98.1 F (36.7 C) 97.9 F (36.6 C)  SpO2: 98% 94%   Skin clean, dry and intact without evidence of skin break down, no evidence of skin tears noted. IV catheter discontinued intact. Site without signs and symptoms of complications - no redness or edema noted at insertion site, patient denies c/o pain - only slight tenderness at site.  Dressing with slight pressure applied.  D/c Instructions-Education: Discharge instructions given to patient/family with verbalized understanding. D/c education completed with patient/family including follow up instructions, medication list, d/c activities limitations if indicated, with other d/c instructions as indicated by MD - patient able to verbalize understanding, all questions fully answered. Patient instructed to return to ED, call 911, or call MD for any changes in condition.  Patient escorted via [place], and D/C home via private auto.  Clovis Fredrickson,  LPN 05/07/4973 30:05 PM

## 2021-08-08 NOTE — Progress Notes (Signed)
  Transition of Care [facility]) Screening Note   Patient Details  Name: Alexandria Webster Date of Birth: January 23, 1966   Transition of Care [facility]) CM/SW Contact:    Iona Beard, [place] Phone Number: 08/08/2021, 10:22 AM    Transition of Care Department [facility]) has reviewed patient and no TOC needs have been identified at this time. We will continue to monitor patient advancement through interdisciplinary progression rounds. If new patient transition needs arise, please place a TOC consult.

## 2021-08-08 NOTE — Discharge Summary (Signed)
Physician Discharge Summary  Alexandria Webster EZM:[id number] DOB: Mar 14, 1966 DOA: 08/06/2021  PCP: Jake Samples, PA-C  Admit date: 08/06/2021 Discharge date: 08/08/2021  Admitted From:  Home  Disposition: Home   Recommendations for Outpatient Follow-up:  Follow up with PCP in 1 weeks Please obtain BMP/CBC in 1 week Please arrange outpatient MRI cervical spine w/wo contrast to follow up cervical disc disease Ambulatory referral to vascular surgery requested for follow up carotid artery disease Ambulatory referral to GI for ongoing nausea and vomiting with poor oral intake and weight loss.  Please follow up on the following pending results:  MRI cervical spine  Discharge Condition: Stable   CODE STATUS: Full  Diet: carb modified, heart healthy recommended   Brief Hospitalization Summary: Please see all hospital notes, images, labs for full details of the hospitalization. 56 year old female with type 2 diabetes mellitus, COPD, GERD, hypertension, hyperlipidemia, hypothyroidism, PVD status post aortobifemoral bypass surgery had been in her usual state of health when she got up to go to the bathroom this morning she passed out.  She was not even aware that she had passed out.  Her son found her on the floor and she thought that she was still in the bed.  She was confused.  She hit her right shoulder and right wrist.  She hit her head.  She has no history of epilepsy.  She has no prior history of syncope other than a small episode she had years ago when she was dehydrated and was found to be hypotensive at that time.  She denies having chest pain.  She only complains of right shoulder and wrist pain and left rib pain.  She was evaluated in the ED noted to have an elevated creatinine, low potassium and magnesium and WBC of 11.9.  She reports that she had approximately a 2-week period of poor oral intake due to nausea and vomiting.  She is feeling better now from that standpoint but was told by  her PCP that she had a bump in her creatinine when it was tested a couple of days ago.  She is being admitted for sudden syncopal episode and metabolic derangements.    08/08/2021: pt reports feeling well today, she is ambulating well, her labs are much improved, renal function normal, tolerating diet, she says she was seeing vascular surgery but was lost to follow up during covid but willing to go back, also requested GI referral as she has been dealing with recurrent bouts of n/v and poor oral intake tolerance.  Pt says she is willing to do outpatient MRI of cervical spine as MRI not available at hospital for next 2 days due to weekend and holiday. Pt will follow up with her PCP for ongoing surveillance and management.   HOSPITAL COURSE BY PROBLEM LIST   Assessment and Plan: * Syncope and collapse -- likely metabolic cause given dehydration, AKI, hypotension -- work up with 2D echo, carotid dopplers, MRI brain, EEG, PT/OT evaluation: tests have been reassuring and unrevealing.    -- fall precautions recommended  -- Pt does have carotid artery disease and needs vascular surgery follow up.  She was seeing Dr. Carlis Abbott but lost to follow up per patient.  We have placed a new referral to vascular surgery for her to get outpatient follow up and ongoing surveillance.     Degeneration of cervical intervertebral disc -- pt needs MRI w/wo contrast for further evaluation -- MRI not available at [facility] for next 2 days.  Pt  says she is willing to get outpatient MRI to follow up as she is not having neck symptoms right now.  I have placed a future order for MRI at [facility] outpatient and requested she get it done in next couple of weeks, order is good for 1 year.  Pt to follow up with her PCP for close outpatient follow up.   AKI (acute kidney injury) ([place]) -- presumably this is prerenal given recent bout of poor oral intake for 2 weeks -- hydrating with IV fluid and renally dosing - recheck BMP in AM after hydration --  renal function improving with  IV fluid hydration creatinine down to 0.94.   Allergic rhinitis -- loratadine ordered   Hypothyroidism -- resume home levothyroxine    Hypomagnesemia -- IV magnesium replacement ordered, repleted   Aortoiliac occlusive disease ([place]) -- pt is s/p aortobifemoral bypass and will need to follow up with vascular surgery, referral made today  Status post aortobifemoral bypass surgery -- follow up with vascular surgery   Hypokalemia -- replace magnesium and then replace potassium -- REPLETED   Constipation -- laxatives ordered as needed   PVD (peripheral vascular disease) ([place]) -- pt referred to vascular surgery for ongoing surveillance  -- she had been seeing Dr. Wilmer Floor but got lost to follow up somehow during covid per patient  Type 2 diabetes mellitus ([place]) -- continue SSI coverage with frequent CBG monitoring  CBG (last 3)  Recent Labs    08/08/21 0229 08/08/21 0718 08/08/21 1119  GLUCAP 130* 138* 142*   Resume home treatments: we did change metformin to metformin ER 1000 mg BID with meals  Leukocytosis -- possibly reactive given recent fall -- no s/s of infection found but still awaiting UA.   -- RESOLVED WBC within normal limits    Essential hypertension, benign -- BPs initially were soft but rebounded with hydration, resume home BP meds   Discharge Diagnoses:  Principal Problem:   Syncope and collapse Active Problems:   Essential hypertension, benign   Leukocytosis   Type 2 diabetes mellitus ([facility])   PVD (peripheral vascular disease) ([facility])   Constipation   Hypokalemia   Status post aortobifemoral bypass surgery   Aortoiliac occlusive disease ([facility])   Hypomagnesemia   Hypothyroidism   Allergic rhinitis   AKI (acute kidney injury) ([place])   Degeneration of cervical intervertebral disc   Discharge Instructions: Discharge Instructions     Ambulatory referral to Gastroenterology   Complete by: As directed    What is the  reason for referral?: Other   Ambulatory referral to Vascular Surgery   Complete by: As directed    Hospital Follow Up.  Pt has been lost to follow up.      Allergies as of 08/08/2021   No Known Allergies      Medication List     STOP taking these medications    metFORMIN 500 MG tablet Commonly known as: GLUCOPHAGE Replaced by: metFORMIN 500 MG 24 hr tablet       TAKE these medications    aspirin 325 MG tablet Take 325 mg by mouth daily.   cetirizine 10 MG tablet Commonly known as: ZYRTEC Take 10 mg by mouth daily.   esomeprazole 20 MG capsule Commonly known as: NEXIUM Take 40 mg by mouth daily.   fluticasone 50 MCG/ACT nasal spray Commonly known as: FLONASE Place 1 spray into both nostrils daily for 14 days.   furosemide 20 MG tablet Commonly known as: Lasix Take 1 tablet (20  mg total) by mouth every other day. Start taking on: August 12, 2021 What changed:  when to take this These instructions start on August 12, 2021. If you are unsure what to do until then, ask your doctor or other care provider.   glucose blood test strip Commonly known as: Accu-Chek Aviva Test glucose 4 times a day   Insulin Pen Needle 31G X 5 MM Misc Commonly known as: B-D UF III MINI PEN NEEDLES Use qhs   levothyroxine 25 MCG tablet Commonly known as: SYNTHROID Take 25 mcg by mouth daily before breakfast.   lisinopril 20 MG tablet Commonly known as: ZESTRIL Take 20 mg by mouth daily.   lovastatin 20 MG tablet Commonly known as: MEVACOR Take 20 mg by mouth at bedtime.   meloxicam 7.5 MG tablet Commonly known as: MOBIC Take 1 tablet (7.5 mg total) by mouth daily as needed for pain. What changed: when to take this   metFORMIN 500 MG 24 hr tablet Commonly known as: GLUCOPHAGE-XR Take 2 tablets (1,000 mg total) by mouth 2 (two) times daily with a meal. Replaces: metFORMIN 500 MG tablet   traZODone 100 MG tablet Commonly known as: DESYREL Take 100 mg by mouth daily.    Trulicity 1.5 LG/9.2JJ Sopn Generic drug: Dulaglutide Inject 1.5 mg into the skin once a week. Sunday   Vitamin D3 25 MCG (1000 UT) Caps Take 1,000 Units by mouth 2 (two) times a day.        Follow-up Information     Marty Heck, MD. Schedule an appointment as soon as possible for a visit in 2 week(s).   Specialty: Vascular Surgery Why: Hospital Follow Up Contact information: [address] [address] [place]. Schedule an appointment as soon as possible for a visit in 2 week(s).   Why: Hospital Follow Up Contact information: [address] [place] [phone]        Jake Samples, PA-C. Schedule an appointment as soon as possible for a visit in 1 week(s).   Specialty: Family Medicine Why: Hospital Follow Up Contact information: [address] [place] [address] [phone]                No Known Allergies Allergies as of 08/08/2021   No Known Allergies      Medication List     STOP taking these medications    metFORMIN 500 MG tablet Commonly known as: GLUCOPHAGE Replaced by: metFORMIN 500 MG 24 hr tablet       TAKE these medications    aspirin 325 MG tablet Take 325 mg by mouth daily.   cetirizine 10 MG tablet Commonly known as: ZYRTEC Take 10 mg by mouth daily.   esomeprazole 20 MG capsule Commonly known as: NEXIUM Take 40 mg by mouth daily.   fluticasone 50 MCG/ACT nasal spray Commonly known as: FLONASE Place 1 spray into both nostrils daily for 14 days.   furosemide 20 MG tablet Commonly known as: Lasix Take 1 tablet (20 mg total) by mouth every other day. Start taking on: August 12, 2021 What changed:  when to take this These instructions start on August 12, 2021. If you are unsure what to do until then, ask your doctor or other care provider.   glucose blood test strip Commonly known as: Accu-Chek Aviva Test  glucose 4 times a day   Insulin Pen Needle 31G X 5 MM Misc Commonly  known as: B-D UF III MINI PEN NEEDLES Use qhs   levothyroxine 25 MCG tablet Commonly known as: SYNTHROID Take 25 mcg by mouth daily before breakfast.   lisinopril 20 MG tablet Commonly known as: ZESTRIL Take 20 mg by mouth daily.   lovastatin 20 MG tablet Commonly known as: MEVACOR Take 20 mg by mouth at bedtime.   meloxicam 7.5 MG tablet Commonly known as: MOBIC Take 1 tablet (7.5 mg total) by mouth daily as needed for pain. What changed: when to take this   metFORMIN 500 MG 24 hr tablet Commonly known as: GLUCOPHAGE-XR Take 2 tablets (1,000 mg total) by mouth 2 (two) times daily with a meal. Replaces: metFORMIN 500 MG tablet   traZODone 100 MG tablet Commonly known as: DESYREL Take 100 mg by mouth daily.   Trulicity 1.5 EG/3.1DV Sopn Generic drug: Dulaglutide Inject 1.5 mg into the skin once a week. Sunday   Vitamin D3 25 MCG (1000 UT) Caps Take 1,000 Units by mouth 2 (two) times a day.        Procedures/Studies: DG Shoulder Right  Result Date: 08/06/2021 CLINICAL DATA:  Shoulder pain post fall EXAM: RIGHT SHOULDER - 2+ VIEW COMPARISON:  None Available. FINDINGS: No fracture or malalignment. Moderate AC joint degenerative change and mild glenohumeral degenerative change. IMPRESSION: No acute osseous abnormality Electronically Signed   By: Donavan Foil M.D.   On: 08/06/2021 17:38   DG Wrist 2 Views Right  Result Date: 08/06/2021 CLINICAL DATA:  Fall, pain EXAM: RIGHT WRIST - 2 VIEW COMPARISON:  None Available. FINDINGS: There is no evidence of fracture or dislocation. The carpus is normally aligned. There is no evidence of arthropathy or other focal bone abnormality. Soft tissues are unremarkable. IMPRESSION: No fracture or dislocation of the right wrist. The carpus is normally aligned. Electronically Signed   By: Delanna Ahmadi M.D.   On: 08/06/2021 17:39   CT Head Wo Contrast  Result Date:  08/06/2021 CLINICAL DATA:  Facial trauma, blunt. Fall from standing position. Syncopal episode. Pain along the left side of the chin. EXAM: CT HEAD WITHOUT CONTRAST CT MAXILLOFACIAL WITHOUT CONTRAST TECHNIQUE: Multidetector CT imaging of the head and maxillofacial structures were performed using the standard protocol without intravenous contrast. Multiplanar CT image reconstructions of the maxillofacial structures were also generated. RADIATION DOSE REDUCTION: This exam was performed according to the departmental dose-optimization program which includes automated exposure control, adjustment of the mA and/or kV according to patient size and/or use of iterative reconstruction technique. COMPARISON:  None Available. FINDINGS: CT HEAD FINDINGS Brain: No acute infarct, hemorrhage, or mass lesion is present. Mild white matter changes are present. A lacunar infarct in the left thalamus is likely remote. The ventricles are of normal size. No significant extraaxial fluid collection is present. Vascular: Atherosclerotic calcifications within the cavernous internal carotid arteries have increased over time. No hyperdense vessel is present. Skull: Calvarium is intact. No focal lytic or blastic lesions are present. A left parietal scalp lesion measures 18 mm in maximal axial dimension cephalo caudad measurement is increased from 10 mm to 20 mm. Peripheral calcification is noted. A smaller subcutaneous lesion with calcification is present in the right occipital scalp, and increased in size to now 7 mm. Other: None CT MAXILLOFACIAL FINDINGS Osseous: No fracture or mandibular dislocation. No destructive process. Orbits: Negative. No traumatic or inflammatory finding. Sinuses: The paranasal sinuses and mastoid air cells are clear. Soft tissues: Soft tissue swelling diffuse stranding present to the left of the horizontal ramus  of the mandible without underlying fracture or foreign body. Muscles are intact. IMPRESSION: 1. Soft tissue  swelling to the left of the horizontal ramus of the mandible without underlying fracture or foreign body. Consistent with edema and soft tissue hematoma. 2. No underlying fracture or dislocation of the mandible. 3. No acute intracranial abnormality. 4. Mild white matter disease is likely remote. 5. Remote lacunar infarct in the left thalamus. 6. Enlarging subcutaneous lesions in the left parietal and right occipital scalp. These likely represent benign lesions such as sebaceous cysts. Subcutaneous lymph nodes are also considered. These areas should be amenable to direct examination. Electronically Signed   By: San Morelle M.D.   On: 08/06/2021 11:51   CT ANGIO NECK W OR WO CONTRAST  Result Date: 08/07/2021 CLINICAL DATA:  Carotid artery stenosis EXAM: CT ANGIOGRAPHY NECK TECHNIQUE: Multidetector CT imaging of the neck was performed using the standard protocol during bolus administration of intravenous contrast. Multiplanar CT image reconstructions and MIPs were obtained to evaluate the vascular anatomy. Carotid stenosis measurements (when applicable) are obtained utilizing NASCET criteria, using the distal internal carotid diameter as the denominator. RADIATION DOSE REDUCTION: This exam was performed according to the departmental dose-optimization program which includes automated exposure control, adjustment of the mA and/or kV according to patient size and/or use of iterative reconstruction technique. CONTRAST:  68m OMNIPAQUE IOHEXOL 350 MG/ML SOLN COMPARISON:  Carotids ultrasound from the same day. FINDINGS: Aortic arch: Atherosclerosis the aorta and great vessel origins. Approximately 50% stenosis of the right brachiocephalic origin. Right carotid system: Moderate stenosis of the right common carotid artery origin. Atherosclerosis at the carotid bifurcation with less than 50% stenosis. Left carotid system: Approximately 50% stenosis of the common carotid artery origin. Atherosclerosis at the carotid  bifurcation and involving the proximal ICA with approximately 50% stenosis of the proximal ICA. Vertebral arteries: Right vertebral artery is occluded just distal to its origin and weakly reconstitutes at the C3 level. The left vertebral artery is patent with moderate to severe stenosis due to mass effect from adjacent osteophytes at C5-C6 (for example see series 5, image 227). Otherwise, multifocal moderate stenosis due to atherosclerosis and mass effect from osteophytes. Skeleton: Severe multilevel degenerative change including severe degenerative disc disease, bulky osteophytes at all multilevel facet and uncovertebral hypertrophy with severe foraminal stenosis at multiple levels. 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 There is resulting probable compression of the cord. Other neck: Edema in the left upper neck related to trauma characterized on maxillofacial CT. Upper chest: Visualized lung apices are clear. IMPRESSION: 1. Age-indeterminate right vertebral artery occlusion just distal to its origin and weakly reconstitutes at the C3 level. This could be due to atherosclerosis or dissection. 2. Left vertebral artery is patent with moderate to severe stenosis due to mass effect from adjacent osteophytes at C5-C6. 3. Bilateral carotid bifurcation atherosclerosis with approximately 50% left proximal ICA stenosis. 4. Approximately 50% stenosis of the right brachiocephalic origin. 5. 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 Recommend MRI of the cervical spine with contrast to further evaluate and to also exclude a mass. 6. Please see  same day maxillofacial CT for evaluation of findings in the face. Electronically Signed  By: Margaretha Sheffield M.D.   On: 08/07/2021 18:59   MR BRAIN WO CONTRAST  Result Date: 08/06/2021 CLINICAL DATA:  Provided history: Neuro deficit, acute, stroke suspected; mental status change, unknown cause. Additional history provided: The and fall this morning. EXAM: MRI HEAD WITHOUT CONTRAST TECHNIQUE: Multiplanar, multiecho pulse sequences of the brain and surrounding structures were obtained without intravenous contrast. COMPARISON:  Prior head CT examinations 08/06/2021 and earlier. FINDINGS: Brain: No age advanced or lobar predominant parenchymal atrophy. Chronic lacunar infarcts within the bilateral cerebral hemispheric white matter. Additional age advanced multifocal T2 FLAIR hyperintense signal abnormality within the cerebral white matter, nonspecific but likely also reflecting chronic small vessel ischemic disease given the patient's history of diabetes, hypertension and hypercholesterolemia. Chronic lacunar infarcts within the bilateral thalami. Small chronic infarcts within the bilateral cerebellar hemispheres. There is no acute infarct. No evidence of an intracranial mass. No chronic intracranial blood products. No extra-axial fluid collection. No midline shift. Vascular: Maintained flow voids within the proximal large arterial vessels. Skull and upper cervical spine: No focal suspicious marrow lesion. Sinuses/Orbits: No mass or acute finding within the imaged orbits. Minimal mucosal thickening within the bilateral ethmoid sinuses. Other: Left facial soft tissue swelling. Ovoid T2 intermediate to hypointense lesions measuring up to 20 x 9 mm within the right occipital and left temporal occipital scalp, nonspecific but likely benign and possibly reflecting sebaceous cysts. IMPRESSION: 1. No evidence of acute intracranial abnormality. 2. Age advanced chronic small vessel ischemic changes within the cerebral  white matter, including chronic lacunar infarcts. 3. Chronic lacunar infarcts within the bilateral thalami. 4. Small chronic infarcts within the bilateral cerebellar hemispheres. 5. Left facial soft tissue swelling. Electronically Signed   By: Kellie Simmering D.O.   On: 08/06/2021 13:43   US Carotid Bilateral  Result Date: 08/07/2021 CLINICAL DATA:  Syncope and collapse EXAM: BILATERAL CAROTID DUPLEX ULTRASOUND TECHNIQUE: Pearline Cables scale imaging, color Doppler and duplex ultrasound were performed of bilateral carotid and vertebral arteries in the neck. COMPARISON:  06/04/2021 FINDINGS: Criteria: Quantification of carotid stenosis is based on velocity parameters that correlate the residual internal carotid diameter with NASCET-based stenosis levels, using the diameter of the distal internal carotid lumen as the denominator for stenosis measurement. The following velocity measurements were obtained: RIGHT ICA: 121/25 cm/sec CCA: 22/29 cm/sec SYSTOLIC ICA/CCA RATIO:  1.3 ECA: 134 cm/sec LEFT ICA: 137/27 cm/sec CCA: 798/92 cm/sec SYSTOLIC ICA/CCA RATIO:  1.1 ECA: 181 cm/sec RIGHT CAROTID ARTERY: Moderate long segment noncalcified hypoechoic plaque of the distal common carotid artery. Focal calcified plaque of the external carotid artery. Mild heterogeneous plaque of the internal carotid artery origin. RIGHT VERTEBRAL ARTERY:  Retrograde flow. LEFT CAROTID ARTERY: Mild hypoechoic noncalcified plaque and intimal thickening of the proximal left common carotid artery. Calcified plaque of the external carotid artery origin. Mild to moderate focal heterogeneous plaque of the ICA origin. LEFT VERTEBRAL ARTERY:  Antegrade flow. IMPRESSION: 1. 50-69% stenosis of the left internal carotid artery. 2. Less than 50% stenosis of the right internal carotid artery. 3. Interval development of retrograde flow in right vertebral artery. 4. Further evaluation with CT angiography of the neck should be considered tissue min the cause of new  retrograde right vertebral artery flow. Electronically Signed   By: Miachel Roux M.D.   On: 08/07/2021 12:47   EEG adult  Result Date: 08/07/2021 Lora Havens, MD     08/07/2021  7:44 AM Patient Name: BRIANTE LOVEALL MRN: [id number] Epilepsy Attending: Lora Havens Referring Physician/Provider:  Murlean Iba, MD Date: 08/07/2021 Duration: 23.26 mins Patient history: 56yo F with syncope. EEG to evaluate for seizure Level of alertness: Awake AEDs during EEG study: None Technical aspects: This EEG study was done with scalp electrodes positioned according to the 10-20 International system of electrode placement. Electrical activity was acquired at a sampling rate of '500Hz'$  and reviewed with a high frequency filter of '70Hz'$  and a low frequency filter of '1Hz'$ . EEG data were recorded continuously and digitally stored. Description: The posterior dominant rhythm consists of 8-'9Hz'$  activity of moderate voltage (25-35 uV) seen predominantly in posterior head regions, symmetric and reactive to eye opening and eye closing. Physiologic photic driving was seen during photic stimulation.  Hyperventilation was not performed.   IMPRESSION: This study is within normal limits. No seizures or epileptiform discharges were seen throughout the recording. Lora Havens   ECHOCARDIOGRAM COMPLETE  Result Date: 08/06/2021    ECHOCARDIOGRAM REPORT   Patient Name:   LAMEES GABLE Date of Exam: 08/06/2021 Medical Rec #:  [id number]        Height:       61.0 in Accession #:    [id number]       Weight:       137.1 lb Date of Birth:  12-27-1965        BSA:          1.609 m Patient Age:    61 years         BP:           144/80 mmHg Patient Gender: F                HR:           90 bpm. Exam Location:  [facility] Procedure: 2D Echo, Cardiac Doppler and Color Doppler Indications:    R55 Syncope  History:        Patient has prior history of Echocardiogram examinations, most                 recent 09/06/2018. COPD,  Signs/Symptoms:Syncope; Risk                 Factors:Hypertension, Diabetes and GERD.  Sonographer:    Bernadene Person RDCS Referring Phys: [place]  1. Left ventricular ejection fraction, by estimation, is 65 to 70%. Left ventricular ejection fraction by 2D MOD biplane is 68.5 %. The left ventricle has normal function. The left ventricle has no regional wall motion abnormalities. Left ventricular diastolic parameters are consistent with Grade I diastolic dysfunction (impaired relaxation).  2. Right ventricular systolic function is normal. The right ventricular size is normal. Tricuspid regurgitation signal is inadequate for assessing PA pressure.  3. The mitral valve is grossly normal. No evidence of mitral valve regurgitation.  4. The aortic valve is tricuspid. Aortic valve regurgitation is not visualized.  5. The inferior vena cava is normal in size with greater than 50% respiratory variability, suggesting right atrial pressure of 3 mmHg. Comparison(s): Changes from prior study are noted. 09/06/2018: LVEF 60-65%, grade 1 DD. FINDINGS  Left Ventricle: Left ventricular ejection fraction, by estimation, is 65 to 70%. Left ventricular ejection fraction by 2D MOD biplane is 68.5 %. The left ventricle has normal function. The left ventricle has no regional wall motion abnormalities. The left ventricular internal cavity size was normal in size. There is no left ventricular hypertrophy. Left ventricular diastolic parameters are consistent with Grade I diastolic dysfunction (impaired relaxation). Indeterminate filling pressures.  Right Ventricle: The right ventricular size is normal. No increase in right ventricular wall thickness. Right ventricular systolic function is normal. Tricuspid regurgitation signal is inadequate for assessing PA pressure. Left Atrium: Left atrial size was normal in size. Right Atrium: Right atrial size was normal in size. Pericardium: There is no evidence of pericardial  effusion. Mitral Valve: The mitral valve is grossly normal. No evidence of mitral valve regurgitation. Tricuspid Valve: The tricuspid valve is grossly normal. Tricuspid valve regurgitation is trivial. Aortic Valve: The aortic valve is tricuspid. Aortic valve regurgitation is not visualized. Pulmonic Valve: The pulmonic valve was normal in structure. Pulmonic valve regurgitation is not visualized. Aorta: The aortic root and ascending aorta are structurally normal, with no evidence of dilitation. Venous: The inferior vena cava is normal in size with greater than 50% respiratory variability, suggesting right atrial pressure of 3 mmHg. IAS/Shunts: No atrial level shunt detected by color flow Doppler.  LEFT VENTRICLE PLAX 2D                        Biplane EF (MOD) LVIDd:         3.80 cm         LV Biplane EF:   Left LVIDs:         2.60 cm                          ventricular LV PW:         0.90 cm                          ejection LV IVS:        0.80 cm                          fraction by LVOT diam:     2.10 cm                          2D MOD LV SV:         41                               biplane is LV SV Index:   25                               68.5 %. LVOT Area:     3.46 cm                                Diastology                                LV e' medial:    6.49 cm/s LV Volumes (MOD)               LV E/e' medial:  10.2 LV vol d, MOD    54.4 ml       LV e' lateral:   9.57 cm/s A2C:                           LV E/e' lateral: 6.9 LV vol  d, MOD    69.7 ml A4C: LV vol s, MOD    19.1 ml A2C: LV vol s, MOD    20.0 ml A4C: LV SV MOD A2C:   35.3 ml LV SV MOD A4C:   69.7 ml LV SV MOD BP:    43.2 ml RIGHT VENTRICLE RV S prime:     11.70 cm/s TAPSE (M-mode): 1.6 cm LEFT ATRIUM             Index        RIGHT ATRIUM           Index LA diam:        2.60 cm 1.62 cm/m   RA Area:     10.40 cm LA Vol (A2C):   21.5 ml 13.36 ml/m  RA Volume:   20.80 ml  12.93 ml/m LA Vol (A4C):   17.0 ml 10.57 ml/m LA Biplane Vol: 20.9 ml  12.99 ml/m  AORTIC VALVE LVOT Vmax:   73.00 cm/s LVOT Vmean:  48.000 cm/s LVOT VTI:    0.118 m  AORTA Ao Root diam: 3.10 cm MITRAL VALVE MV Area (PHT): 3.77 cm    SHUNTS MV Decel Time: 201 msec    Systemic VTI:  0.12 m MV E velocity: 66.40 cm/s  Systemic Diam: 2.10 cm MV A velocity: 95.50 cm/s MV E/A ratio:  0.70 Lyman Bishop MD Electronically signed by Lyman Bishop MD Signature Date/Time: 08/06/2021/4:48:55 PM    Final    CT Maxillofacial Wo Contrast  Result Date: 08/06/2021 CLINICAL DATA:  Facial trauma, blunt. Fall from standing position. Syncopal episode. Pain along the left side of the chin. EXAM: CT HEAD WITHOUT CONTRAST CT MAXILLOFACIAL WITHOUT CONTRAST TECHNIQUE: Multidetector CT imaging of the head and maxillofacial structures were performed using the standard protocol without intravenous contrast. Multiplanar CT image reconstructions of the maxillofacial structures were also generated. RADIATION DOSE REDUCTION: This exam was performed according to the departmental dose-optimization program which includes automated exposure control, adjustment of the mA and/or kV according to patient size and/or use of iterative reconstruction technique. COMPARISON:  None Available. FINDINGS: CT HEAD FINDINGS Brain: No acute infarct, hemorrhage, or mass lesion is present. Mild white matter changes are present. A lacunar infarct in the left thalamus is likely remote. The ventricles are of normal size. No significant extraaxial fluid collection is present. Vascular: Atherosclerotic calcifications within the cavernous internal carotid arteries have increased over time. No hyperdense vessel is present. Skull: Calvarium is intact. No focal lytic or blastic lesions are present. A left parietal scalp lesion measures 18 mm in maximal axial dimension cephalo caudad measurement is increased from 10 mm to 20 mm. Peripheral calcification is noted. A smaller subcutaneous lesion with calcification is present in the right occipital  scalp, and increased in size to now 7 mm. Other: None CT MAXILLOFACIAL FINDINGS Osseous: No fracture or mandibular dislocation. No destructive process. Orbits: Negative. No traumatic or inflammatory finding. Sinuses: The paranasal sinuses and mastoid air cells are clear. Soft tissues: Soft tissue swelling diffuse stranding present to the left of the horizontal ramus of the mandible without underlying fracture or foreign body. Muscles are intact. IMPRESSION: 1. Soft tissue swelling to the left of the horizontal ramus of the mandible without underlying fracture or foreign body. Consistent with edema and soft tissue hematoma. 2. No underlying fracture or dislocation of the mandible. 3. No acute intracranial abnormality. 4. Mild white matter disease is likely remote. 5. Remote lacunar infarct in the left thalamus. 6. Enlarging subcutaneous  lesions in the left parietal and right occipital scalp. These likely represent benign lesions such as sebaceous cysts. Subcutaneous lymph nodes are also considered. These areas should be amenable to direct examination. Electronically Signed   By: San Morelle M.D.   On: 08/06/2021 11:51     Subjective: Pt reports that she is feeling well today.  No more episodes, eating and drinking well.  She is ambulating well. No chest pain, no SOB, no neck pain.  Pt says she is agreeable to doing outpatient MRI of cervical spine and following up with vascular surgery.    Discharge Exam: Vitals:   08/07/21 1926 08/08/21 0536  BP: (!) 108/52 (!) 152/88  Pulse: 79 77  Resp: 18   Temp: 98.1 F (36.7 C) 97.9 F (36.6 C)  SpO2: 98% 94%   Vitals:   08/07/21 1345 08/07/21 1926 08/08/21 0400 08/08/21 0536  BP: (!) 110/59 (!) 108/52  (!) 152/88  Pulse: 79 79  77  Resp: 16 18    Temp: 98.4 F (36.9 C) 98.1 F (36.7 C)  97.9 F (36.6 C)  TempSrc: Oral Oral  Oral  SpO2: 96% 98%  94%  Weight:   65.2 kg   Height:        General: Pt is alert, awake, not in acute  distress Cardiovascular: RRR, S1/S2 +, no rubs, no gallops Respiratory: CTA bilaterally, no wheezing, no rhonchi Abdominal: Soft, NT, ND, bowel sounds + Extremities: no edema, no cyanosis Neuro: nonfocal exam, no focal findings.    The results of significant diagnostics from this hospitalization (including imaging, microbiology, ancillary and laboratory) are listed below for reference.     Microbiology: No results found for this or any previous visit (from the past 240 hour(s)).   Labs: BNP (last 3 results) No results for input(s): BNP in the last 8760 hours. Basic Metabolic Panel: Recent Labs  Lab 08/06/21 1004 08/06/21 1044 08/06/21 1550 08/07/21 0613 08/08/21 0430  NA 140  --   --  139 141  K 3.3*  --   --  3.9 4.4  CL 100  --   --  107 111  CO2 27  --   --  27 27  GLUCOSE 155*  --   --  117* 140*  BUN 25*  --   --  20 13  CREATININE 1.77*  --  1.61* 1.10* 0.94  CALCIUM 9.9  --   --  8.9 8.9  MG  --  1.3*  --  2.1  --    Liver Function Tests: No results for input(s): AST, ALT, ALKPHOS, BILITOT, PROT, ALBUMIN in the last 168 hours. No results for input(s): LIPASE, AMYLASE in the last 168 hours. No results for input(s): AMMONIA in the last 168 hours. CBC: Recent Labs  Lab 08/06/21 1004 08/06/21 1550 08/07/21 0613  WBC 11.9* 12.0* 6.5  NEUTROABS  --   --  3.7  HGB 14.0 13.5 11.5*  HCT 39.3 38.2 32.4*  MCV 94.0 93.4 94.5  PLT 169 178 160   Cardiac Enzymes: No results for input(s): CKTOTAL, CKMB, CKMBINDEX, TROPONINI in the last 168 hours. BNP: Invalid input(s): POCBNP CBG: Recent Labs  Lab 08/07/21 1952 08/07/21 2044 08/08/21 0229 08/08/21 0718 08/08/21 1119  GLUCAP 310* 323* 130* 138* 142*   D-Dimer No results for input(s): DDIMER in the last 72 hours. Hgb A1c Recent Labs    08/06/21 1550  HGBA1C 6.5*   Lipid Profile Recent Labs    08/07/21 0613  CHOL  146  HDL 44  LDLCALC 78  TRIG 120  CHOLHDL 3.3   Thyroid function studies Recent  Labs    08/06/21 1550  TSH 1.159   Anemia work up No results for input(s): VITAMINB12, FOLATE, FERRITIN, TIBC, IRON, RETICCTPCT in the last 72 hours. Urinalysis    Component Value Date/Time   COLORURINE YELLOW 08/07/2021 2134   APPEARANCEUR CLEAR 08/07/2021 2134   LABSPEC 1.016 08/07/2021 2134   PHURINE 7.0 08/07/2021 2134   GLUCOSEU >=500 (A) 08/07/2021 2134   HGBUR SMALL (A) 08/07/2021 2134   BILIRUBINUR NEGATIVE 08/07/2021 2134   KETONESUR NEGATIVE 08/07/2021 2134   PROTEINUR NEGATIVE 08/07/2021 2134   UROBILINOGEN 0.2 12/21/2014 0902   NITRITE NEGATIVE 08/07/2021 2134   LEUKOCYTESUR TRACE (A) 08/07/2021 2134   Sepsis Labs Invalid input(s): PROCALCITONIN,  WBC,  LACTICIDVEN Microbiology No results found for this or any previous visit (from the past 240 hour(s)).  Time coordinating discharge: 44 mins   SIGNED:  Irwin Brakeman, MD  [facility] 08/08/2021, 11:29 AM How to contact the [facility] Attending or Consulting provider [place] or covering provider during after hours [place], for this patient?  Check the care team in [facility] and look for a) attending/consulting [facility] provider listed and b) the [facility] team listed Log into [email or web address] and use [place]'s universal password to access. If you do not have the password, please contact the hospital operator. Locate the [facility] provider you are looking for under [facility] and page to a number that you can be directly reached. If you still have difficulty reaching the provider, please page the [facility] (Director on Call) for the Hospitalists listed on amion for assistance.

## 2021-08-08 NOTE — Discharge Instructions (Signed)
PLEASE SCHEDULE MRI CERVICAL SPINE IN NEXT 1-2 WEEKS AT  [facility] MRI DEPARTMENT. FUTURE ORDER ALREADY PLACED IN SYSTEM FOR PATIENT.    PLEASE FOLLOW UP WITH VASCULAR SURGERY AS SOON AS POSSIBLE TO REVIEW CAROTID ARTERY DISEASE.    PLEASE SCHEDULE FOLLOW UP WITH GASTROENTEROLOGY REGARDING NAUSEA AND VOMITING AND WEIGHT LOSS.  REFERRAL HAS BEEN SENT.  THEY SHOULD BE CALLING YOU WITH AN APPOINTMENT.       IMPORTANT INFORMATION: PAY CLOSE ATTENTION   PHYSICIAN DISCHARGE INSTRUCTIONS  Follow with Primary care provider  Jake Samples, PA-C  and other consultants as instructed by your Hospitalist Physician  [facility] IF SYMPTOMS COME BACK, WORSEN OR NEW PROBLEM DEVELOPS   Please note: You were cared for by a hospitalist during your hospital stay. Every effort will be made to forward records to your primary care provider.  You can request that your primary care provider send for your hospital records if they have not received them.  Once you are discharged, your primary care physician will handle any further medical issues. Please note that NO REFILLS for any discharge medications will be authorized once you are discharged, as it is imperative that you return to your primary care physician (or establish a relationship with a primary care physician if you do not have one) for your post hospital discharge needs so that they can reassess your need for medications and monitor your lab values.  Please get a complete blood count and chemistry panel checked by your Primary MD at your next visit, and again as instructed by your Primary MD.  Get Medicines reviewed and adjusted: Please take all your medications with you for your next visit with your Primary MD  Laboratory/radiological data: Please request your Primary MD to go over all hospital tests and procedure/radiological results at the follow up, please ask your primary care provider to get all  Hospital records sent to his/her office.  In some cases, they will be blood work, cultures and biopsy results pending at the time of your discharge. Please request that your primary care provider follow up on these results.  If you are diabetic, please bring your blood sugar readings with you to your follow up appointment with primary care.    Please call and make your follow up appointments as soon as possible.    Also Note the following: If you experience worsening of your admission symptoms, develop shortness of breath, life threatening emergency, suicidal or homicidal thoughts you must seek medical attention immediately by calling 911 or calling your MD immediately  if symptoms less severe.  You must read complete instructions/literature along with all the possible adverse reactions/side effects for all the Medicines you take and that have been prescribed to you. Take any new Medicines after you have completely understood and accpet all the possible adverse reactions/side effects.   Do not drive when taking Pain medications or sleeping medications (Benzodiazepines)  Do not take more than prescribed Pain, Sleep and Anxiety Medications. It is not advisable to combine anxiety,sleep and pain medications without talking with your primary care practitioner  Special Instructions: If you have smoked or chewed Tobacco  in the last 2 yrs please stop smoking, stop any regular Alcohol  and or any Recreational drug use.  Wear Seat belts while driving.  Do not drive if taking any narcotic, mind altering or controlled substances or recreational drugs or alcohol.

## 2021-08-08 NOTE — Assessment & Plan Note (Signed)
--   pt needs MRI w/wo contrast for further evaluation -- MRI not available at [facility] for next 2 days.  Pt says she is willing to get outpatient MRI to follow up as she is not having neck symptoms right now.  I have placed a future order for MRI at [facility] outpatient and requested she get it done in next couple of weeks, order is good for 1 year.  Pt to follow up with her PCP for close outpatient follow up.

## 2021-08-11 ENCOUNTER — Ambulatory Visit: Payer: 59 | Admitting: Vascular Surgery

## 2021-08-11 ENCOUNTER — Encounter: Payer: Self-pay | Admitting: Vascular Surgery

## 2021-08-11 ENCOUNTER — Encounter: Payer: Self-pay | Admitting: Internal Medicine

## 2021-08-11 VITALS — BP 74/54 | HR 93 | Temp 97.3°F | Resp 14 | Ht 61.0 in | Wt 132.0 lb

## 2021-08-11 DIAGNOSIS — I6523 Occlusion and stenosis of bilateral carotid arteries: Secondary | ICD-10-CM

## 2021-08-11 NOTE — Progress Notes (Signed)
[facility] [place]  Patient name: Alexandria Webster MRN: [id number] DOB: 29-Oct-1965 Sex: female  REASON FOR VISIT: Evaluation carotid and vertebral stenosis  HPI: Alexandria Webster is a 56 y.o. 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  She is here today with her daughter.  She is well-known to our practice.  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 Kellie Simmering.  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 Carlis Abbott in 2020.  She did well with this.  She was admitted to [facility] on 08/07/2021.  She had a syncopal episode and actually fell and was found by her son.  She has no recollection of the event.  She denies any chronic dizziness or chronic neurologic changes.  She specifically has no prior history of transient ischemic attack, aphasia, amaurosis fugax or stroke.  She was neurologically intact when she awoke.  She underwent a extensive work-up at [facility] to include carotid duplex and CT angiogram of her neck and head.  Duplex suggested moderate internal carotid stenosis bilaterally and retrograde flow in her right vertebral artery.  She subsequently underwent CT angiogram for further evaluation.  She has returned to her baseline neurologic status.  She does have a great deal of bruising on her left jaw and neck related to her fall.  Past Medical History:  Diagnosis Date   Asthma    COPD (chronic obstructive pulmonary disease) (HCC)    DDD (degenerative disc disease), lumbar    Diabetes mellitus    Gastroparesis    GERD without esophagitis    High cholesterol    Hypertension    Hypothyroidism    PONV (postoperative nausea and vomiting)    PVD (peripheral vascular disease) ([place])    Wears glasses     Family History  Problem Relation Age of Onset   Stroke Mother    Hypertension Mother    Heart failure Father     Asthma Father    Diabetes Father    Heart disease Father    Emphysema Paternal Grandfather     SOCIAL HISTORY: Social History   Tobacco Use   Smoking status: Former    Packs/day: 0.00    Years: 36.00    Pack years: 0.00    Types: Cigarettes    Quit date: 03/14/2018    Years since quitting: 3.4   Smokeless tobacco: Current    Types: Snuff  Substance Use Topics   Alcohol use: No    No Known Allergies  Current Outpatient Medications  Medication Sig Dispense Refill   aspirin 325 MG tablet Take 325 mg by mouth daily.     cetirizine (ZYRTEC) 10 MG tablet Take 10 mg by mouth daily.     Cholecalciferol (VITAMIN D3) 1000 units CAPS Take 1,000 Units by mouth 2 (two) times a day.      esomeprazole (NEXIUM) 20 MG capsule Take 40 mg by mouth daily.     fluticasone (FLONASE) 50 MCG/ACT nasal spray Place 1 spray into both nostrils daily for 14 days. 16 g 0   [START ON 08/12/2021] furosemide (LASIX) 20 MG tablet Take 1 tablet (20 mg total) by mouth every other day.  0   glucose blood (ACCU-CHEK AVIVA) test strip Test glucose 4 times a day 150 each 3   Insulin Pen Needle (B-D UF III MINI PEN NEEDLES) 31G X 5 MM MISC Use qhs 100 each  5   levothyroxine (SYNTHROID, LEVOTHROID) 25 MCG tablet Take 25 mcg by mouth daily before breakfast.     lisinopril (ZESTRIL) 20 MG tablet Take 20 mg by mouth daily.     lovastatin (MEVACOR) 20 MG tablet Take 20 mg by mouth at bedtime.     meloxicam (MOBIC) 7.5 MG tablet Take 1 tablet (7.5 mg total) by mouth daily as needed for pain.     metFORMIN (GLUCOPHAGE-XR) 500 MG 24 hr tablet Take 2 tablets (1,000 mg total) by mouth 2 (two) times daily with a meal. 120 tablet 1   traZODone (DESYREL) 100 MG tablet Take 100 mg by mouth daily.     TRULICITY 1.5 LE/7.5TZ SOPN Inject 1.5 mg into the skin once a week. Sunday     No current facility-administered medications for this visit.    REVIEW OF SYSTEMS:  '[X]'$  denotes positive finding, '[ ]'$  denotes negative  finding Cardiac  Comments:  Chest pain or chest pressure:    Shortness of breath upon exertion:    Short of breath when lying flat:    Irregular heart rhythm:        Vascular    Pain in calf, thigh, or hip brought on by ambulation:    Pain in feet at night that wakes you up from your sleep:     Blood clot in your veins:    Leg swelling:           PHYSICAL EXAM: Vitals:   08/11/21 0937 08/11/21 0947  BP: (!) 72/52 (!) 74/54  Pulse: 93   Resp: 14   Temp: (!) 97.3 F (36.3 C)   TempSrc: Temporal   SpO2: 93%   Weight: 132 lb (59.9 kg)   Height: '5\' 1"'$  (1.549 m)     GENERAL: The patient is a well-nourished female, in no acute distress. The vital signs are documented above. CARDIOVASCULAR: Carotid arteries without bruits.  Palpable radial pulses bilaterally PULMONARY: There is good air exchange  MUSCULOSKELETAL: There are no major deformities or cyanosis. NEUROLOGIC: No focal weakness or paresthesias are detected. SKIN: There are no ulcers or rashes noted. PSYCHIATRIC: The patient has a normal affect.  DATA:  I reviewed her carotid duplex and her CT scan.  I discussed this at length with the patient and her daughter.  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  The left vertebral artery is widely patent off of the left subclavian artery into the basilar artery and the skull.  She has some calcification at the origin of the left subclavian but no evidence of stenosis.  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  She has moderate stenosis of her internal carotid artery bilaterally approximately 50%  MEDICAL ISSUES: I discussed these findings at length with the patient.  She does not have any evidence of diminished flow to her vertebral basilar system.  Her right vertebral is occluded therefore no possibility for steal.  I explained that with her moderate carotid disease, we would recommend follow-up carotid duplex in 1 year to rule out progression of  asymptomatic disease.  Of note, her blood pressure is low today.  She is in the 75 to 80 degree range systolic bilaterally.  Heart rate is 93.  She was felt to be quite dehydrated on admission to the emergency department at [facility] prior to release.  She will hold her antihypertensives today.  She is to see her primary care provider for follow-up tomorrow.  Rosetta Posner, MD FACS [facility] [phone]  Note: Portions of this report may have been transcribed using voice recognition software.  Every effort has been made to ensure accuracy; however, inadvertent computerized transcription errors may still be present.

## 2021-08-12 DIAGNOSIS — I779 Disorder of arteries and arterioles, unspecified: Secondary | ICD-10-CM | POA: Diagnosis not present

## 2021-08-12 DIAGNOSIS — Z6827 Body mass index (BMI) 27.0-27.9, adult: Secondary | ICD-10-CM | POA: Diagnosis not present

## 2021-08-12 DIAGNOSIS — E118 Type 2 diabetes mellitus with unspecified complications: Secondary | ICD-10-CM | POA: Diagnosis not present

## 2021-08-12 DIAGNOSIS — R55 Syncope and collapse: Secondary | ICD-10-CM | POA: Diagnosis not present

## 2021-08-12 DIAGNOSIS — K219 Gastro-esophageal reflux disease without esophagitis: Secondary | ICD-10-CM | POA: Diagnosis not present

## 2021-09-08 NOTE — Progress Notes (Unsigned)
GI Office Note    Referring Provider: Jake Samples, PA* Primary Care Physician:  Jake Samples, PA-C  Primary GI: Dr. Abbey Chatters  Chief Complaint   Chief Complaint  Patient presents with   Nausea   Emesis    Been going on for 6 to 7 mths.    Constipation    History of Present Illness   Alexandria Webster is a 56 y.o. female presenting today at the request of Jake Samples, [place]* for nausea, vomiting, and constipation.   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 She had follow-up with vascular after the scan and she subsequently underwent redo aortobifemoral bypass due to occlusions.  She had recent hospital admission for syncope from 5/26 - 5/28. She presented from home after syncopal episode. She reported 2 week history of poor oral intake due to nausea and vomiting. Full syncopal workup unrevealing. Used to follow with vascular surgery for carotid artery disease, was lost to follow-up during [place].  Was referred to vascular after discharge.  On admission she had AKI, this improved with IV fluids.  Constipation controlled by laxatives.  Seen vascular surgery on 08/11/2021 - carotid duplex and CT scan discussed with patient at this visit.  It was stated that her left vertebral artery is widely patent and although her right vertebral artery is occluded I recommended repeat carotid duplex in 1 year.  Her systolic blood pressure was in the 75-80 range bilaterally at this visit.  She was advised to hold antihypertensives for that day.  Per PCP phone encounter 08/12/21 patient requested to see local GI due to GERD and pain. Phone encounter for hospital  follow up.  Patient wanted to wait on MRI for C-spine CBC and BMP ordered.   Today:  Wasn't going to the bathroom regularly but has been on miralax and colace now going to the bathroom once or twice a day and they are soft or slightly looser. Not having to strain.   Last vomiting was this morning. Has not had anything to eat, only had some green tea. Sometimes she gest nauseas or vomits with riding in the car. Emesis is either dark green or yellow. If she vomit bile her stomach relaxes. Most time when she eats, 5-10 minutes after she gets sick, usually its all foods. Was previously vomiting with every meal, now it is improved and now it is only 1-2 times a week. Denies abdominal pain. Felt like she was getting sick every time she took the metformin.   With trulicity she has had less of an appetite (has been on it for 2 months). Still has gallbladder. Eating mostly vegetables, not to often does she eat hamburgers. Eats peanut butter and eggs for protein. Once in a while she will eat chicken.   GERD - on nexium once a day in the mornings before breakfast. Denies breakthrough symptoms.  Denies dysphagia, food aversion.   Denies frequent shortness of breath or chest pain. Takes aspirin 325 mg daily.   History of gastroparesis - reports a physician at the [facility] clinic told her that.   No history of EGD or colonoscopy.   Accompanied by her daughter [name].   Past Medical History:  Diagnosis Date   Asthma    COPD (chronic  obstructive pulmonary disease) (HCC)    DDD (degenerative disc disease), lumbar    Diabetes mellitus    Gastroparesis    GERD without esophagitis    High cholesterol    Hypertension    Hypothyroidism    PONV (postoperative nausea and vomiting)    PVD (peripheral vascular disease) ([place])    Wears glasses     Past Surgical History:  Procedure Laterality Date   AORTA - BILATERAL FEMORAL ARTERY BYPASS GRAFT N/A 09/27/2018   Procedure: Redo Exposure  AORTOBIFEMORAL Bypass  and bilateral femoral Artery ; Redo Aortobifemoral  BYPASS GRAFT.;  Surgeon: Marty Heck, MD;  Location: [facility] OR;  Service: Vascular;  Laterality: N/A;   BACK SURGERY     CORONARY ANGIOPLASTY WITH STENT PLACEMENT     CORONARY ARTERY BYPASS GRAFT     FEMORAL ARTERY STENT  right leg   TUBAL LIGATION      Current Outpatient Medications  Medication Sig Dispense Refill   aspirin 325 MG tablet Take 325 mg by mouth daily.     cetirizine (ZYRTEC) 10 MG tablet Take 10 mg by mouth daily.     Cholecalciferol (VITAMIN D3) 1000 units CAPS Take 1,000 Units by mouth 2 (two) times a day.      esomeprazole (NEXIUM) 20 MG capsule Take 40 mg by mouth daily.     fluticasone (FLONASE) 50 MCG/ACT nasal spray Place 1 spray into both nostrils daily for 14 days. 16 g 0   furosemide (LASIX) 20 MG tablet Take 1 tablet (20 mg total) by mouth every other day.  0   glucose blood (ACCU-CHEK AVIVA) test strip Test glucose 4 times a day 150 each 3   Insulin Pen Needle (B-D UF III MINI PEN NEEDLES) 31G X 5 MM MISC Use qhs 100 each 5   levothyroxine (SYNTHROID, LEVOTHROID) 25 MCG tablet Take 25 mcg by mouth daily before breakfast.     lisinopril (ZESTRIL) 20 MG tablet Take 20 mg by mouth daily.     lovastatin (MEVACOR) 20 MG tablet Take 20 mg by mouth at bedtime.     meloxicam (MOBIC) 7.5 MG tablet Take 1 tablet (7.5 mg total) by mouth daily as needed for pain.     metFORMIN (GLUCOPHAGE-XR) 500 MG 24 hr tablet Take 2 tablets (1,000 mg total) by mouth 2 (two) times daily with a meal. 120 tablet 1   traZODone (DESYREL) 100 MG tablet Take 100 mg by mouth daily.     TRULICITY 1.5 NL/8.9QJ SOPN Inject 1.5 mg into the skin once a week. Sunday     No current facility-administered medications for this visit.    Allergies as of 09/09/2021   (No Known Allergies)    Family History  Problem Relation Age of Onset   Stroke Mother    Hypertension Mother    Heart failure Father    Asthma Father    Diabetes Father     Heart disease Father    Emphysema Paternal Grandfather     Social History   Socioeconomic History   Marital status: Single    Spouse name: Not on file   Number of children: Not on file   Years of education: Not on file   Highest education level: Not on file  Occupational History   Not on file  Tobacco Use   Smoking status: Former    Packs/day: 0.00    Years: 36.00    Total pack years: 0.00    Types: Cigarettes    Quit  date: 03/14/2018    Years since quitting: 3.4   Smokeless tobacco: Current    Types: Snuff  Vaping Use   Vaping Use: Never used  Substance and Sexual Activity   Alcohol use: No   Drug use: No   Sexual activity: Yes    Birth control/protection: Surgical    Comment: tubal  Other Topics Concern   Not on file  Social History Narrative   Not on file   Social Determinants of Health   Financial Resource Strain: Not on file  Food Insecurity: Not on file  Transportation Needs: Not on file  Physical Activity: Not on file  Stress: Not on file  Social Connections: Not on file  Intimate Partner Violence: Not on file     Review of Systems   Gen: Denies any fever, chills, fatigue, weight loss, lack of appetite.  CV: Denies chest pain, heart palpitations, peripheral edema, syncope.  Resp: Denies shortness of breath at rest or with exertion. Denies wheezing or cough.  GI: see HPI GU : Denies urinary burning, urinary frequency, urinary hesitancy MS: Denies joint pain, muscle weakness, cramps, or limitation of movement.  Derm: Denies rash, itching, dry skin Psych: Denies depression, anxiety, memory loss, and confusion Heme: Denies bruising, bleeding, and enlarged lymph nodes.   Physical Exam   BP 113/70 (BP Location: Left Arm, Patient Position: Sitting, Cuff Size: Normal)   Pulse 96   Temp (!) 97.1 F (36.2 C) (Temporal)   Ht '5\' 4"'$  (1.626 m)   Wt 132 lb (59.9 kg)   LMP 01/21/2012   SpO2 96%   BMI 22.66 kg/m   General:   Alert and oriented. Pleasant  and cooperative. Well-nourished and well-developed.  Head:  Normocephalic and atraumatic. Eyes:  Without icterus, sclera clear and conjunctiva pink.  Ears:  Normal auditory acuity. Mouth:  No deformity or lesions, oral mucosa pink.  Lungs:  Clear to auscultation bilaterally. No wheezes, rales, or rhonchi. No distress.  Heart:  S1, S2 present without murmurs appreciated.  Abdomen:  Exam limited as needed to be completed sitting in wheelchair. +BS, soft, non-tender and non-distended. No guarding or rebound. No masses appreciated.  Rectal:  Deferred  Msk:  Unable to get onto exam table, in wheelchair since fall Extremities:  Without edema. Neurologic:  Alert and  oriented x4;  grossly normal neurologically. Skin:  Scattered bruising.  Psych:  Alert and cooperative. Normal mood and affect.   Assessment   DAJAI WAHLERT is a 56 y.o. female with a history of asthma, COPD, diabetes, gastroparesis, GERD, HTN, hypothyroid, PVD presenting today with nausea, vomiting, and constipation  GERD: Currently on Nexium 40 mg daily.  Denies frequent breakthrough symptoms.  Denies any dysphagia or early satiety.  She does have lack of appetite feels this has been due to Trulicity she started 2 months prior.  She will continue Nexium 40 mg daily.  Nausea/vomiting: Onset about 6 to 7 months ago.  She reportedly was having almost daily nausea with vomiting.  Emesis usually either bilious or light yellow, denies hematemesis.  Symptoms have significantly improved since her constipation has improved over the last couple of months.  She now is only having nausea with vomiting episodes about once or twice a week.  She has been unable to attribute it to any certain foods, however she did mention that drinking anything with milk in it does seem to upset her stomach.  Previously nausea vomiting was happening with every single meal, now sometimes only happens randomly.  For example today she was nauseous and vomited after  presenting to the clinic just prior to the visit, however she has not eaten anything today.  States that she does not eat any fatty or greasy foods on a regular basis, usually gets protein from her eggs or peanut butter.  She states she regularly eats any lean proteins but does eat chicken from time to time.  Current gastroparesis given her history of uncontrolled diabetes, this is listed in her chart however she has not had any confirmatory testing with gastric emptying study.  Not on any prokinetics.  She does have GERD as stated above.  Weight has been stable.  We will proceed with upper endoscopy for further evaluation.  If this is negative, we may pursue gastric emptying study and/or abdominal imaging to further evaluate.  Constipation: Patient reports that prior to a couple months ago she will go weeks without bowel movements.  PCP started her on MiraLAX and Colace she has now having daily soft/loose bowel movements.  Not currently having to strain.  Denies melena or hematochezia.  Reports her diet primarily consists of fruits and vegetables, not as much protein.  Advised to continue current laxative regimen.  Screening for colon cancer: She has never had a colonoscopy.  No family history of colon cancer or colon polyps that she is aware of.  We will proceed with first-ever TCS.  PLAN   Proceed with upper endoscopy and colonoscopy with propofol by Dr. Abbey Chatters in near future: the risks, benefits, and alternatives have been discussed with the patient in detail. The patient states understanding and desires to proceed. ASA 3 Hold ASA '325mg'$  for 5 days prior to procedure No oral diabetes medication night prior an morning of procedure  Continue nexium 40 mg daily.  Continue MiraLAX and Colace daily. Follow-up 2 months post procedure   Venetia Night, MSN, FNP-BC, AGACNP-BC [facility]

## 2021-09-09 ENCOUNTER — Ambulatory Visit: Payer: 59 | Admitting: Gastroenterology

## 2021-09-09 ENCOUNTER — Encounter: Payer: Self-pay | Admitting: Gastroenterology

## 2021-09-09 VITALS — BP 113/70 | HR 96 | Temp 97.1°F | Ht 64.0 in | Wt 132.0 lb

## 2021-09-09 DIAGNOSIS — K219 Gastro-esophageal reflux disease without esophagitis: Secondary | ICD-10-CM

## 2021-09-09 DIAGNOSIS — Z1211 Encounter for screening for malignant neoplasm of colon: Secondary | ICD-10-CM | POA: Diagnosis not present

## 2021-09-09 DIAGNOSIS — R112 Nausea with vomiting, unspecified: Secondary | ICD-10-CM | POA: Diagnosis not present

## 2021-09-09 DIAGNOSIS — K5900 Constipation, unspecified: Secondary | ICD-10-CM | POA: Diagnosis not present

## 2021-09-09 NOTE — Patient Instructions (Addendum)
We are scheduling you for an EGD and colonoscopy with Dr. Abbey Chatters in the near future.  We will provide you separate written instructions in the mail but just for your reference you will need to hold your aspirin for 5 days prior to procedure.  You will also need to hold your oral diabetes medication the night prior to in the morning of your procedure.  You may continue Trulicity as scheduled.  For your constipation:  -Continue MiraLAX and Colace as you have been  For your reflux:  - Continue Nexium 40 mg daily.  For your nausea and vomiting: -We will get the upper endoscopy, if this is negative then we may proceed with a gastric emptying study to further evaluate the motility of your stomach.  -Continue to avoid any fatty/greasy foods.  I would recommend for you to increase your protein with lean meats such as chicken or [place] that is baked broiled or grilled.  -We may also get imaging of your abdomen to double check your gallbladder in the future if it worsens or continues.  I will have you follow-up about 2 months after your procedure.  It was a pleasure to meet you today!  It was a pleasure to see you today. I want to create trusting relationships with patients. If you receive a survey regarding your visit,  I greatly appreciate you taking time to fill this out on paper or through your MyChart. I value your feedback.  Venetia Night, MSN, FNP-BC, AGACNP-BC [facility]

## 2021-09-10 ENCOUNTER — Encounter: Payer: Self-pay | Admitting: *Deleted

## 2021-09-10 ENCOUNTER — Telehealth: Payer: Self-pay | Admitting: *Deleted

## 2021-09-10 MED ORDER — PEG 3350-KCL-NA BICARB-NACL 420 G PO SOLR: ORAL | 0 refills | Status: DC

## 2021-09-10 NOTE — Telephone Encounter (Signed)
Spoke with pt and daughter. Scheduled for TCS/EGD w/ Dr. Abbey Chatters ASA 3 on 8/3 at 8:30am. Aware will mail instructions/pre-op. Rx sent to pharmacy

## 2021-09-16 DIAGNOSIS — R531 Weakness: Secondary | ICD-10-CM | POA: Diagnosis not present

## 2021-09-16 DIAGNOSIS — R2689 Other abnormalities of gait and mobility: Secondary | ICD-10-CM | POA: Diagnosis not present

## 2021-09-16 DIAGNOSIS — M2569 Stiffness of other specified joint, not elsewhere classified: Secondary | ICD-10-CM | POA: Diagnosis not present

## 2021-09-16 DIAGNOSIS — Z9181 History of falling: Secondary | ICD-10-CM | POA: Diagnosis not present

## 2021-09-28 DIAGNOSIS — R2689 Other abnormalities of gait and mobility: Secondary | ICD-10-CM | POA: Diagnosis not present

## 2021-09-28 DIAGNOSIS — M2569 Stiffness of other specified joint, not elsewhere classified: Secondary | ICD-10-CM | POA: Diagnosis not present

## 2021-09-28 DIAGNOSIS — R531 Weakness: Secondary | ICD-10-CM | POA: Diagnosis not present

## 2021-09-28 DIAGNOSIS — Z9181 History of falling: Secondary | ICD-10-CM | POA: Diagnosis not present

## 2021-09-29 ENCOUNTER — Other Ambulatory Visit: Payer: Self-pay | Admitting: Nephrology

## 2021-09-29 ENCOUNTER — Other Ambulatory Visit (HOSPITAL_COMMUNITY): Payer: Self-pay | Admitting: Nephrology

## 2021-09-29 DIAGNOSIS — Z951 Presence of aortocoronary bypass graft: Secondary | ICD-10-CM | POA: Diagnosis not present

## 2021-09-29 DIAGNOSIS — E1122 Type 2 diabetes mellitus with diabetic chronic kidney disease: Secondary | ICD-10-CM | POA: Diagnosis not present

## 2021-09-29 DIAGNOSIS — D638 Anemia in other chronic diseases classified elsewhere: Secondary | ICD-10-CM | POA: Diagnosis not present

## 2021-09-29 DIAGNOSIS — R809 Proteinuria, unspecified: Secondary | ICD-10-CM | POA: Diagnosis not present

## 2021-09-29 DIAGNOSIS — I5032 Chronic diastolic (congestive) heart failure: Secondary | ICD-10-CM | POA: Diagnosis not present

## 2021-09-29 DIAGNOSIS — I952 Hypotension due to drugs: Secondary | ICD-10-CM | POA: Diagnosis not present

## 2021-09-29 DIAGNOSIS — I779 Disorder of arteries and arterioles, unspecified: Secondary | ICD-10-CM

## 2021-09-29 DIAGNOSIS — I6523 Occlusion and stenosis of bilateral carotid arteries: Secondary | ICD-10-CM | POA: Diagnosis not present

## 2021-09-29 DIAGNOSIS — N189 Chronic kidney disease, unspecified: Secondary | ICD-10-CM | POA: Diagnosis not present

## 2021-09-29 DIAGNOSIS — Z8679 Personal history of other diseases of the circulatory system: Secondary | ICD-10-CM | POA: Diagnosis not present

## 2021-09-29 DIAGNOSIS — I6501 Occlusion and stenosis of right vertebral artery: Secondary | ICD-10-CM | POA: Diagnosis not present

## 2021-09-30 DIAGNOSIS — I779 Disorder of arteries and arterioles, unspecified: Secondary | ICD-10-CM | POA: Diagnosis not present

## 2021-09-30 DIAGNOSIS — J3089 Other allergic rhinitis: Secondary | ICD-10-CM | POA: Diagnosis not present

## 2021-09-30 DIAGNOSIS — R55 Syncope and collapse: Secondary | ICD-10-CM | POA: Diagnosis not present

## 2021-09-30 DIAGNOSIS — R0989 Other specified symptoms and signs involving the circulatory and respiratory systems: Secondary | ICD-10-CM | POA: Diagnosis not present

## 2021-09-30 DIAGNOSIS — E782 Mixed hyperlipidemia: Secondary | ICD-10-CM | POA: Diagnosis not present

## 2021-09-30 DIAGNOSIS — E039 Hypothyroidism, unspecified: Secondary | ICD-10-CM | POA: Diagnosis not present

## 2021-10-07 ENCOUNTER — Ambulatory Visit (HOSPITAL_COMMUNITY)
Admission: RE | Admit: 2021-10-07 | Discharge: 2021-10-07 | Disposition: A | Discharge status: Home / Self Care | Payer: 59 | Source: Ambulatory Visit | Attending: Nephrology | Admitting: Nephrology

## 2021-10-07 DIAGNOSIS — R809 Proteinuria, unspecified: Secondary | ICD-10-CM | POA: Diagnosis not present

## 2021-10-07 DIAGNOSIS — I779 Disorder of arteries and arterioles, unspecified: Secondary | ICD-10-CM | POA: Diagnosis not present

## 2021-10-07 DIAGNOSIS — N3289 Other specified disorders of bladder: Secondary | ICD-10-CM | POA: Diagnosis not present

## 2021-10-12 ENCOUNTER — Encounter (HOSPITAL_COMMUNITY)
Admission: RE | Admit: 2021-10-12 | Discharge: 2021-10-12 | Disposition: A | Discharge status: Home / Self Care | Payer: 59 | Source: Ambulatory Visit | Attending: Internal Medicine | Admitting: Internal Medicine

## 2021-10-12 ENCOUNTER — Encounter (HOSPITAL_COMMUNITY): Payer: Self-pay

## 2021-10-12 ENCOUNTER — Other Ambulatory Visit: Payer: Self-pay

## 2021-10-12 NOTE — Patient Instructions (Signed)
Alexandria Webster  10/12/2021     '@PREFPERIOPPHARMACY'$ @   Your procedure is scheduled on  10/14/2021.   Report to [facility] at  0700 A.M.   Call this number if you have problems the morning of surgery:  [phone]   Remember:  Follow the diet and prep instructions given to you by the office.      Your last dose of trulicity should have been 10/06/2021.     DO NOT take any medications for diabetes the morning of your procedure.    Take these medicines the morning of surgery with A SIP OF WATER        zyrtec, nexium, synthroid, mobic(if needed), desyrel.     Do not wear jewelry, make-up or nail polish.  Do not wear lotions, powders, or perfumes, or deodorant.  Do not shave 48 hours prior to surgery.  Men may shave face and neck.  Do not bring valuables to the hospital.  [facility] is not responsible for any belongings or valuables.  Contacts, dentures or bridgework may not be worn into surgery.  Leave your suitcase in the car.  After surgery it may be brought to your room.  For patients admitted to the hospital, discharge time will be determined by your treatment team.  Patients discharged the day of surgery will not be allowed to drive home and must have someone with them for 24 hours.    Special instructions:   DO NOT smoke tobacco or vape for 24 hours before your procedure.   Please read over the following fact sheets that you were given. Anesthesia Post-op Instructions and Care and Recovery After Surgery      Upper Endoscopy, Adult, Care After This sheet gives you information about how to care for yourself after your procedure. Your health care provider may also give you more specific instructions. If you have problems or questions, contact your health care provider. What can I expect after the procedure? After the procedure, it is common to have: A sore throat. Mild stomach pain or discomfort. Bloating. Nausea. Follow these instructions at  home:  Follow instructions from your health care provider about what to eat or drink after your procedure. Return to your normal activities as told by your health care provider. Ask your health care provider what activities are safe for you. Take over-the-counter and prescription medicines only as told by your health care provider. If you were given a sedative during the procedure, it can affect you for several hours. Do not drive or operate machinery until your health care provider says that it is safe. Keep all follow-up visits as told by your health care provider. This is important. Contact a health care provider if you have: A sore throat that lasts longer than one day. Trouble swallowing. Get help right away if: You vomit blood or your vomit looks like coffee grounds. You have: A fever. Bloody, black, or tarry stools. A severe sore throat or you cannot swallow. Difficulty breathing. Severe pain in your chest or abdomen. Summary After the procedure, it is common to have a sore throat, mild stomach discomfort, bloating, and nausea. If you were given a sedative during the procedure, it can affect you for several hours. Do not drive or operate machinery until your health care provider says that it is safe. Follow instructions from your health care provider about what to eat or drink after your procedure. Return to your normal activities as told  by your health care provider. This information is not intended to replace advice given to you by your health care provider. Make sure you discuss any questions you have with your health care provider. Document Revised: 01/04/2019 Document Reviewed: 07/31/2017 Elsevier Patient Education  [place]. Colonoscopy, Adult, Care After The following information offers guidance on how to care for yourself after your procedure. Your health care provider may also give you more specific instructions. If you have problems or questions, contact your  health care provider. What can I expect after the procedure? After the procedure, it is common to have: A small amount of blood in your stool for 24 hours after the procedure. Some gas. Mild cramping or bloating of your abdomen. Follow these instructions at home: Eating and drinking  Drink enough fluid to keep your urine pale yellow. Follow instructions from your health care provider about eating or drinking restrictions. Resume your normal diet as told by your health care provider. Avoid heavy or fried foods that are hard to digest. Activity Rest as told by your health care provider. Avoid sitting for a long time without moving. Get up to take short walks every 1-2 hours. This is important to improve blood flow and breathing. Ask for help if you feel weak or unsteady. Return to your normal activities as told by your health care provider. Ask your health care provider what activities are safe for you. Managing cramping and bloating  Try walking around when you have cramps or feel bloated. If directed, apply heat to your abdomen as told by your health care provider. Use the heat source that your health care provider recommends, such as a moist heat pack or a heating pad. Place a towel between your skin and the heat source. Leave the heat on for 20-30 minutes. Remove the heat if your skin turns bright red. This is especially important if you are unable to feel pain, heat, or cold. You have a greater risk of getting burned. General instructions If you were given a sedative during the procedure, it can affect you for several hours. Do not drive or operate machinery until your health care provider says that it is safe. For the first 24 hours after the procedure: Do not sign important documents. Do not drink alcohol. Do your regular daily activities at a slower pace than normal. Eat soft foods that are easy to digest. Take over-the-counter and prescription medicines only as told by your  health care provider. Keep all follow-up visits. This is important. Contact a health care provider if: You have blood in your stool 2-3 days after the procedure. Get help right away if: You have more than a small spotting of blood in your stool. You have large blood clots in your stool. You have swelling of your abdomen. You have nausea or vomiting. You have a fever. You have increasing pain in your abdomen that is not relieved with medicine. These symptoms may be an emergency. Get help right away. Call 911. Do not wait to see if the symptoms will go away. Do not drive yourself to the hospital. Summary After the procedure, it is common to have a small amount of blood in your stool. You may also have mild cramping and bloating of your abdomen. If you were given a sedative during the procedure, it can affect you for several hours. Do not drive or operate machinery until your health care provider says that it is safe. Get help right away if you have a  lot of blood in your stool, nausea or vomiting, a fever, or increased pain in your abdomen. This information is not intended to replace advice given to you by your health care provider. Make sure you discuss any questions you have with your health care provider. Document Revised: 10/21/2020 Document Reviewed: 10/21/2020 Elsevier Patient Education  [place] After This sheet gives you information about how to care for yourself after your procedure. Your health care provider may also give you more specific instructions. If you have problems or questions, contact your health care provider. What can I expect after the procedure? After the procedure, it is common to have: Tiredness. Forgetfulness about what happened after the procedure. Impaired judgment for important decisions. Nausea or vomiting. Some difficulty with balance. Follow these instructions at home: For the time period you were told by your  health care provider:     Rest as needed. Do not participate in activities where you could fall or become injured. Do not drive or use machinery. Do not drink alcohol. Do not take sleeping pills or medicines that cause drowsiness. Do not make important decisions or sign legal documents. Do not take care of children on your own. Eating and drinking Follow the diet that is recommended by your health care provider. Drink enough fluid to keep your urine pale yellow. If you vomit: Drink water, juice, or soup when you can drink without vomiting. Make sure you have little or no nausea before eating solid foods. General instructions Have a responsible adult stay with you for the time you are told. It is important to have someone help care for you until you are awake and alert. Take over-the-counter and prescription medicines only as told by your health care provider. If you have sleep apnea, surgery and certain medicines can increase your risk for breathing problems. Follow instructions from your health care provider about wearing your sleep device: Anytime you are sleeping, including during daytime naps. While taking prescription pain medicines, sleeping medicines, or medicines that make you drowsy. Avoid smoking. Keep all follow-up visits as told by your health care provider. This is important. Contact a health care provider if: You keep feeling nauseous or you keep vomiting. You feel light-headed. You are still sleepy or having trouble with balance after 24 hours. You develop a rash. You have a fever. You have redness or swelling around the IV site. Get help right away if: You have trouble breathing. You have new-onset confusion at home. Summary For several hours after your procedure, you may feel tired. You may also be forgetful and have poor judgment. Have a responsible adult stay with you for the time you are told. It is important to have someone help care for you until you are  awake and alert. Rest as told. Do not drive or operate machinery. Do not drink alcohol or take sleeping pills. Get help right away if you have trouble breathing, or if you suddenly become confused. This information is not intended to replace advice given to you by your health care provider. Make sure you discuss any questions you have with your health care provider. Document Revised: 02/02/2021 Document Reviewed: 01/31/2019 Elsevier Patient Education  [place].

## 2021-10-14 ENCOUNTER — Encounter (HOSPITAL_COMMUNITY): Payer: Self-pay

## 2021-10-14 ENCOUNTER — Other Ambulatory Visit: Payer: Self-pay

## 2021-10-14 ENCOUNTER — Encounter (HOSPITAL_COMMUNITY)
Admission: RE | Disposition: A | Discharge status: Home / Self Care | Payer: Self-pay | Source: Home / Self Care | Attending: Internal Medicine

## 2021-10-14 ENCOUNTER — Ambulatory Visit (HOSPITAL_COMMUNITY)
Admission: RE | Admit: 2021-10-14 | Discharge: 2021-10-14 | Disposition: A | Discharge status: Home / Self Care | Payer: 59 | Attending: Internal Medicine | Admitting: Internal Medicine

## 2021-10-14 ENCOUNTER — Ambulatory Visit (HOSPITAL_BASED_OUTPATIENT_CLINIC_OR_DEPARTMENT_OTHER): Payer: 59 | Admitting: Anesthesiology

## 2021-10-14 ENCOUNTER — Ambulatory Visit (HOSPITAL_COMMUNITY): Payer: 59 | Admitting: Anesthesiology

## 2021-10-14 DIAGNOSIS — E039 Hypothyroidism, unspecified: Secondary | ICD-10-CM | POA: Insufficient documentation

## 2021-10-14 DIAGNOSIS — Z1211 Encounter for screening for malignant neoplasm of colon: Secondary | ICD-10-CM | POA: Insufficient documentation

## 2021-10-14 DIAGNOSIS — Z8249 Family history of ischemic heart disease and other diseases of the circulatory system: Secondary | ICD-10-CM | POA: Diagnosis not present

## 2021-10-14 DIAGNOSIS — Z833 Family history of diabetes mellitus: Secondary | ICD-10-CM | POA: Insufficient documentation

## 2021-10-14 DIAGNOSIS — Z7985 Long-term (current) use of injectable non-insulin antidiabetic drugs: Secondary | ICD-10-CM | POA: Diagnosis not present

## 2021-10-14 DIAGNOSIS — K648 Other hemorrhoids: Secondary | ICD-10-CM

## 2021-10-14 DIAGNOSIS — K259 Gastric ulcer, unspecified as acute or chronic, without hemorrhage or perforation: Secondary | ICD-10-CM | POA: Insufficient documentation

## 2021-10-14 DIAGNOSIS — Z87891 Personal history of nicotine dependence: Secondary | ICD-10-CM | POA: Insufficient documentation

## 2021-10-14 DIAGNOSIS — Z7984 Long term (current) use of oral hypoglycemic drugs: Secondary | ICD-10-CM | POA: Diagnosis not present

## 2021-10-14 DIAGNOSIS — I10 Essential (primary) hypertension: Secondary | ICD-10-CM | POA: Insufficient documentation

## 2021-10-14 DIAGNOSIS — E1151 Type 2 diabetes mellitus with diabetic peripheral angiopathy without gangrene: Secondary | ICD-10-CM | POA: Diagnosis not present

## 2021-10-14 DIAGNOSIS — K449 Diaphragmatic hernia without obstruction or gangrene: Secondary | ICD-10-CM | POA: Diagnosis not present

## 2021-10-14 DIAGNOSIS — K2970 Gastritis, unspecified, without bleeding: Secondary | ICD-10-CM | POA: Insufficient documentation

## 2021-10-14 DIAGNOSIS — R112 Nausea with vomiting, unspecified: Secondary | ICD-10-CM | POA: Diagnosis not present

## 2021-10-14 DIAGNOSIS — K219 Gastro-esophageal reflux disease without esophagitis: Secondary | ICD-10-CM | POA: Diagnosis not present

## 2021-10-14 DIAGNOSIS — J449 Chronic obstructive pulmonary disease, unspecified: Secondary | ICD-10-CM | POA: Diagnosis not present

## 2021-10-14 DIAGNOSIS — R12 Heartburn: Secondary | ICD-10-CM | POA: Diagnosis not present

## 2021-10-14 DIAGNOSIS — Z79899 Other long term (current) drug therapy: Secondary | ICD-10-CM | POA: Diagnosis not present

## 2021-10-14 HISTORY — PX: ESOPHAGOGASTRODUODENOSCOPY (EGD) WITH PROPOFOL: SHX5813

## 2021-10-14 HISTORY — PX: COLONOSCOPY WITH PROPOFOL: SHX5780

## 2021-10-14 HISTORY — PX: BIOPSY: SHX5522

## 2021-10-14 LAB — GLUCOSE, CAPILLARY: Glucose-Capillary: 111 mg/dL — ABNORMAL HIGH (ref 70–99)

## 2021-10-14 SURGERY — COLONOSCOPY WITH PROPOFOL
Anesthesia: General

## 2021-10-14 MED ORDER — PROPOFOL 500 MG/50ML IV EMUL
INTRAVENOUS | Status: DC | PRN
  Administered 2021-10-14: 200 ug/kg/min via INTRAVENOUS

## 2021-10-14 MED ORDER — LIDOCAINE 2% (20 MG/ML) 5 ML SYRINGE
INTRAMUSCULAR | Status: DC | PRN
  Administered 2021-10-14: 50 mg via INTRAVENOUS

## 2021-10-14 MED ORDER — LACTATED RINGERS IV SOLN
INTRAVENOUS | Status: DC
Start: 2021-10-14 — End: 2021-10-14

## 2021-10-14 MED ORDER — PHENYLEPHRINE 80 MCG/ML (10ML) SYRINGE FOR IV PUSH (FOR BLOOD PRESSURE SUPPORT)
PREFILLED_SYRINGE | INTRAVENOUS | Status: DC | PRN
  Administered 2021-10-14: 80 ug via INTRAVENOUS
  Administered 2021-10-14: 160 ug via INTRAVENOUS

## 2021-10-14 MED ORDER — PROPOFOL 10 MG/ML IV BOLUS
INTRAVENOUS | Status: DC | PRN
  Administered 2021-10-14: 60 mg via INTRAVENOUS
  Administered 2021-10-14: 20 mg via INTRAVENOUS

## 2021-10-14 NOTE — Discharge Instructions (Signed)
EGD Discharge instructions Please read the instructions outlined below and refer to this sheet in the next few weeks. These discharge instructions provide you with general information on caring for yourself after you leave the hospital. Your doctor may also give you specific instructions. While your treatment has been planned according to the most current medical practices available, unavoidable complications occasionally occur. If you have any problems or questions after discharge, please call your doctor. ACTIVITY You may resume your regular activity but move at a slower pace for the next 24 hours.  Take frequent rest periods for the next 24 hours.  Walking will help expel (get rid of) the air and reduce the bloated feeling in your abdomen.  No driving for 24 hours (because of the anesthesia (medicine) used during the test).  You may shower.  Do not sign any important legal documents or operate any machinery for 24 hours (because of the anesthesia used during the test).  NUTRITION Drink plenty of fluids.  You may resume your normal diet.  Begin with a light meal and progress to your normal diet.  Avoid alcoholic beverages for 24 hours or as instructed by your caregiver.  MEDICATIONS You may resume your normal medications unless your caregiver tells you otherwise.  WHAT YOU CAN EXPECT TODAY You may experience abdominal discomfort such as a feeling of fullness or "gas" pains.  FOLLOW-UP Your doctor will discuss the results of your test with you.  SEEK IMMEDIATE MEDICAL ATTENTION IF ANY OF THE FOLLOWING OCCUR: Excessive nausea (feeling sick to your stomach) and/or vomiting.  Severe abdominal pain and distention (swelling).  Trouble swallowing.  Temperature over 101 F (37.8 C).  Rectal bleeding or vomiting of blood.     Colonoscopy Discharge Instructions  Read the instructions outlined below and refer to this sheet in the next few weeks. These discharge instructions provide you with  general information on caring for yourself after you leave the hospital. Your doctor may also give you specific instructions. While your treatment has been planned according to the most current medical practices available, unavoidable complications occasionally occur.   ACTIVITY You may resume your regular activity, but move at a slower pace for the next 24 hours.  Take frequent rest periods for the next 24 hours.  Walking will help get rid of the air and reduce the bloated feeling in your belly (abdomen).  No driving for 24 hours (because of the medicine (anesthesia) used during the test).   Do not sign any important legal documents or operate any machinery for 24 hours (because of the anesthesia used during the test).  NUTRITION Drink plenty of fluids.  You may resume your normal diet as instructed by your doctor.  Begin with a light meal and progress to your normal diet. Heavy or fried foods are harder to digest and may make you feel sick to your stomach (nauseated).  Avoid alcoholic beverages for 24 hours or as instructed.  MEDICATIONS You may resume your normal medications unless your doctor tells you otherwise.  WHAT YOU CAN EXPECT TODAY Some feelings of bloating in the abdomen.  Passage of more gas than usual.  Spotting of blood in your stool or on the toilet paper.  IF YOU HAD POLYPS REMOVED DURING THE COLONOSCOPY: No aspirin products for 7 days or as instructed.  No alcohol for 7 days or as instructed.  Eat a soft diet for the next 24 hours.  FINDING OUT THE RESULTS OF YOUR TEST Not all test results are  available during your visit. If your test results are not back during the visit, make an appointment with your caregiver to find out the results. Do not assume everything is normal if you have not heard from your caregiver or the medical facility. It is important for you to follow up on all of your test results.  SEEK IMMEDIATE MEDICAL ATTENTION IF: You have more than a spotting of  blood in your stool.  Your belly is swollen (abdominal distention).  You are nauseated or vomiting.  You have a temperature over 101.  You have abdominal pain or discomfort that is severe or gets worse throughout the day.   Your EGD revealed mild amount inflammation in your stomach.  I took biopsies of this to rule out infection with a bacteria called H. pylori.  Await pathology results, my office will contact you. Continue on Nexium  Your colonoscopy was relatively unremarkable.  I did not find any polyps or evidence of colon cancer.  I recommend repeating colonoscopy in 10 years for colon cancer screening purposes.    Follow up with GI in 3 months  I hope you have a great rest of your week!  Elon Alas. Abbey Chatters, D.O. Gastroenterology and Hepatology [facility]

## 2021-10-14 NOTE — Op Note (Signed)
[facility] Patient Name: Alexandria Webster Procedure Date: 10/14/2021 8:57 AM MRN: [id number] Date of Birth: [birth year] Attending MD: Elon Alas. Edgar Frisk CSN: [id number] Age: 56 Admit Type: Outpatient Procedure:                Colonoscopy Indications:              Screening for colorectal malignant neoplasm Providers:                Elon Alas. Abbey Chatters, DO, Charlsie Quest. Insurance claims handler, Therapist, sports,                            Suzan Garibaldi. Risa Grill, Technician Referring MD:              Medicines:                See the Anesthesia note for documentation of the                            administered medications Complications:            No immediate complications. Estimated Blood Loss:     Estimated blood loss: none. Procedure:                Pre-Anesthesia Assessment:                           - The anesthesia plan was to use monitored                            anesthesia care (MAC).                           After obtaining informed consent, the colonoscope                            was passed under direct vision. Throughout the                            procedure, the patient's blood pressure, pulse, and                            oxygen saturations were monitored continuously. The                            [id number]-HQ190L ([id number]) scope was introduced through                            the anus and advanced to the the cecum, identified                            by appendiceal orifice and ileocecal valve. The                            colonoscopy was performed without difficulty. The                            patient tolerated the  procedure well. The quality                            of the bowel preparation was evaluated using the                            BBPS [facility] Bowel Preparation Scale) with scores                            of: Right Colon = 3, Transverse Colon = 3 and Left                            Colon = 3 (entire mucosa seen well with no residual                             staining, small fragments of stool or opaque                            liquid). The total BBPS score equals 9. Scope In: 8:59:30 AM Scope Out: 9:09:09 AM Scope Withdrawal Time: 0 hours 6 minutes 15 seconds  Total Procedure Duration: 0 hours 9 minutes 39 seconds  Findings:      The perianal and digital rectal examinations were normal.      The entire examined colon appeared normal. Impression:               - The entire examined colon is normal.                           - No specimens collected. Moderate Sedation:      Per Anesthesia Care Recommendation:           - Patient has a contact number available for                            emergencies. The signs and symptoms of potential                            delayed complications were discussed with the                            patient. Return to normal activities tomorrow.                            Written discharge instructions were provided to the                            patient.                           - Resume previous diet.                           - Continue present medications.                           -  Repeat colonoscopy in 10 years for screening                            purposes.                           - Return to GI clinic in 3 months. Procedure Code(s):        --- Professional ---                           K5625, Colorectal cancer screening; colonoscopy on                            individual not meeting criteria for high risk Diagnosis Code(s):        --- Professional ---                           Z12.11, Encounter for screening for malignant                            neoplasm of colon                           K64.8, Other hemorrhoids CPT copyright 2019 American Medical Association. All rights reserved. The codes documented in this report are preliminary and upon coder review may  be revised to meet current compliance requirements. Elon Alas. Abbey Chatters, DO [place] Abbey Chatters, DO 10/14/2021 9:12:53  AM This report has been signed electronically. Number of Addenda: 0

## 2021-10-14 NOTE — Transfer of Care (Signed)
Immediate Anesthesia Transfer of Care Note  Patient: Alexandria Webster  Procedure(s) Performed: COLONOSCOPY WITH PROPOFOL ESOPHAGOGASTRODUODENOSCOPY (EGD) WITH PROPOFOL BIOPSY  Patient Location: Short Stay  Anesthesia Type:MAC  Level of Consciousness: sedated and responds to stimulation  Airway & Oxygen Therapy: Patient Spontanous Breathing and Patient connected to nasal cannula oxygen  Post-op Assessment: Report given to RN, Post -op Vital signs reviewed and stable and Patient moving all extremities  Post vital signs: Reviewed and stable  Last Vitals:  Vitals Value Taken Time  BP 106/68 10/14/21 0915  Temp 36.9 C 10/14/21 0915  Pulse 78 10/14/21 0915  Resp 18 10/14/21 0915  SpO2 96 % 10/14/21 0915    Last Pain:  Vitals:   10/14/21 0915  TempSrc: Oral  PainSc: Asleep         Complications: No notable events documented.

## 2021-10-14 NOTE — Anesthesia Preprocedure Evaluation (Signed)
Anesthesia Evaluation  Patient identified by MRN, date of birth, ID band Patient awake    Reviewed: Allergy & Precautions, H&P , NPO status , Patient's Chart, lab work & pertinent test results, reviewed documented beta blocker date and time   History of Anesthesia Complications (+) PONV and history of anesthetic complications  Airway Mallampati: II  TM Distance: >3 FB Neck ROM: full    Dental no notable dental hx.    Pulmonary asthma , COPD, former smoker,    Pulmonary exam normal breath sounds clear to auscultation       Cardiovascular Exercise Tolerance: Good hypertension, + Peripheral Vascular Disease   Rhythm:regular Rate:Normal     Neuro/Psych negative neurological ROS  negative psych ROS   GI/Hepatic Neg liver ROS, GERD  Medicated,  Endo/Other  diabetes, Type 2Hypothyroidism   Renal/GU negative Renal ROS  negative genitourinary   Musculoskeletal   Abdominal   Peds  Hematology negative hematology ROS (+)   Anesthesia Other Findings   Reproductive/Obstetrics negative OB ROS                             Anesthesia Physical Anesthesia Plan  ASA: 3  Anesthesia Plan: General   Post-op Pain Management:    Induction:   PONV Risk Score and Plan: Propofol infusion  Airway Management Planned:   Additional Equipment:   Intra-op Plan:   Post-operative Plan:   Informed Consent: I have reviewed the patients History and Physical, chart, labs and discussed the procedure including the risks, benefits and alternatives for the proposed anesthesia with the patient or authorized representative who has indicated his/her understanding and acceptance.     Dental Advisory Given  Plan Discussed with: CRNA  Anesthesia Plan Comments:         Anesthesia Quick Evaluation

## 2021-10-14 NOTE — Transfer of Care (Deleted)
Immediate Anesthesia Transfer of Care Note  Patient: Alexandria Webster  Procedure(s) Performed: COLONOSCOPY WITH PROPOFOL ESOPHAGOGASTRODUODENOSCOPY (EGD) WITH PROPOFOL BIOPSY  Patient Location: Short Stay  Anesthesia Type:MAC  Level of Consciousness: sedated and patient cooperative  Airway & Oxygen Therapy: Patient Spontanous Breathing and Patient connected to nasal cannula oxygen  Post-op Assessment: Report given to RN and Post -op Vital signs reviewed and stable  Post vital signs: Reviewed  Last Vitals:  Vitals Value Taken Time  BP    Temp    Pulse    Resp    SpO2      Last Pain:  Vitals:   10/14/21 0757  TempSrc: Oral  PainSc: 0-No pain         Complications: No notable events documented.

## 2021-10-14 NOTE — Op Note (Signed)
[facility] Patient Name: Alexandria Webster Procedure Date: 10/14/2021 8:44 AM MRN: [id number] Date of Birth: 02/09/1966 Attending MD: Elon Alas. Abbey Chatters DO CSN: [id number] Age: 56 Admit Type: Outpatient Procedure:                Upper GI endoscopy Indications:              Heartburn, Nausea with vomiting Providers:                Elon Alas. Abbey Chatters, DO, Charlsie Quest. Insurance claims handler, Therapist, sports,                            Suzan Garibaldi. Risa Grill, Technician Referring MD:              Medicines:                See the Anesthesia note for documentation of the                            administered medications Complications:            No immediate complications. Estimated Blood Loss:     Estimated blood loss was minimal. Procedure:                Pre-Anesthesia Assessment:                           - The anesthesia plan was to use monitored                            anesthesia care (MAC).                           After obtaining informed consent, the endoscope was                            passed under direct vision. Throughout the                            procedure, the patient's blood pressure, pulse, and                            oxygen saturations were monitored continuously. The                            [id number]-H190 ([id number]) scope was introduced through the                            mouth, and advanced to the second part of duodenum.                            The upper GI endoscopy was accomplished without                            difficulty. The patient tolerated the procedure  well. Scope In: 8:52:03 AM Scope Out: 8:55:11 AM Total Procedure Duration: 0 hours 3 minutes 8 seconds  Findings:      A small hiatal hernia was present.      There is no endoscopic evidence of bleeding, areas of erosion,       esophagitis, ulcerations or varices in the entire esophagus.      Scattered mild inflammation characterized by erosions and erythema was       found in the  gastric body and in the gastric antrum. Biopsies were taken       with a cold forceps for Helicobacter pylori testing.      The duodenal bulb, first portion of the duodenum and second portion of       the duodenum were normal. Impression:               - Small hiatal hernia.                           - Gastritis. Biopsied.                           - Normal duodenal bulb, first portion of the                            duodenum and second portion of the duodenum. Moderate Sedation:      Per Anesthesia Care Recommendation:           - Patient has a contact number available for                            emergencies. The signs and symptoms of potential                            delayed complications were discussed with the                            patient. Return to normal activities tomorrow.                            Written discharge instructions were provided to the                            patient.                           - Resume previous diet.                           - Continue present medications.                           - Await pathology results.                           - Use a proton pump inhibitor PO daily.                           - Return to GI clinic in 3 months.  Procedure Code(s):        --- Professional ---                           [phone], Esophagogastroduodenoscopy, flexible,                            transoral; with biopsy, single or multiple Diagnosis Code(s):        --- Professional ---                           K44.9, Diaphragmatic hernia without obstruction or                            gangrene                           K29.70, Gastritis, unspecified, without bleeding                           R12, Heartburn                           R11.2, Nausea with vomiting, unspecified CPT copyright 2019 American Medical Association. All rights reserved. The codes documented in this report are preliminary and upon coder review may  be revised to meet current  compliance requirements. Elon Alas. Abbey Chatters, DO [place] Abbey Chatters, DO 10/14/2021 8:58:24 AM This report has been signed electronically. Number of Addenda: 0

## 2021-10-14 NOTE — H&P (Signed)
Primary Care Physician:  Scherrie Bateman Primary Gastroenterologist:  Dr. Abbey Chatters  Pre-Procedure History & Physical: HPI:  Alexandria Webster is a 56 y.o. female is here for an EGD for GERD/nausea/vomiting, and colonoscopy to be performed for colon cancer screening purposes.  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 Authorizing Provider  albuterol (VENTOLIN HFA) 108 (90 Base) MCG/ACT inhaler Inhale 1-2 puffs into the lungs every 4 (four) hours as needed for wheezing or shortness of breath. 09/01/21  Yes [provider]  aspirin EC 81 MG tablet Take 324 mg by mouth daily.   Yes [provider]  cetirizine (ZYRTEC) 10 MG tablet Take 10 mg by mouth daily. 07/23/21  Yes [provider]  Cholecalciferol (VITAMIN D3) 1000 units CAPS Take 4,000 Units by mouth daily.   Yes [provider]  docusate sodium (COLACE) 100 MG capsule Take 200 mg by mouth daily.   Yes [provider]  esomeprazole (NEXIUM) 40 MG capsule Take 40 mg by mouth daily.   Yes [provider]  furosemide (LASIX) 20 MG tablet  Take 1 tablet (20 mg total) by mouth every other day. Patient taking differently: Take 20 mg by mouth daily. 08/12/21  Yes Johnson, Clanford L, MD  glimepiride (AMARYL) 1 MG tablet Take 1 mg by mouth daily. 08/18/21  Yes [provider]  levothyroxine (SYNTHROID, LEVOTHROID) 25 MCG tablet Take 25 mcg by mouth daily before breakfast.   Yes [provider]  lisinopril (ZESTRIL) 2.5 MG tablet Take 2.5 mg by mouth daily. 07/23/21  Yes [provider]  lovastatin (MEVACOR) 20 MG tablet Take 20 mg by mouth at bedtime.   Yes [provider]  meloxicam (MOBIC) 7.5 MG tablet Take 1 tablet (7.5 mg total) by mouth daily as needed for pain. Patient taking differently: Take 7.5 mg by mouth in the morning and at bedtime. 08/08/21  Yes Johnson, Clanford L, MD  polyethylene glycol powder (MIRALAX) 17 GM/SCOOP powder Take 17 g by mouth daily.   Yes [provider]  polyethylene glycol-electrolytes (NULYTELY) 420 g solution As directed 09/10/21  Yes Hurshel Keys K, DO  traZODone (DESYREL) 100 MG tablet Take 100 mg by mouth daily. 07/29/21  Yes [provider]  TRULICITY 1.5 WU/9.8JX SOPN Inject 1.5 mg into the skin every Sunday. 07/29/21  Yes [provider]  glucose blood (ACCU-CHEK AVIVA) test strip Test glucose 4 times a day 09/28/15   Cassandria Anger, MD  Insulin Pen Needle (B-D UF III MINI  PEN NEEDLES) 31G X 5 MM MISC Use qhs 09/17/15   Cassandria Anger, MD    Allergies as of 09/10/2021   (No Known Allergies)    Family History  Problem Relation Age of Onset   Stroke Mother    Hypertension Mother    Heart failure Father    Asthma Father    Diabetes Father    Heart disease Father    Emphysema Paternal Grandfather    Colon cancer Neg Hx    Colon polyps Neg Hx     Social History   Socioeconomic History   Marital status: Single    Spouse name: Not on file   Number of children: Not on file   Years of education: Not on file    Highest education level: Not on file  Occupational History   Not on file  Tobacco Use   Smoking status: Former    Packs/day: 0.00    Years: 36.00    Total pack years: 0.00    Types: Cigarettes    Quit date: 03/14/2018    Years since quitting: 3.5   Smokeless tobacco: Current    Types: Snuff  Vaping Use   Vaping Use: Never used  Substance and Sexual Activity   Alcohol use: No   Drug use: No   Sexual activity: Yes    Birth control/protection: Surgical    Comment: tubal  Other Topics Concern   Not on file  Social History Narrative   Not on file   Social Determinants of Health   Financial Resource Strain: Not on file  Food Insecurity: Not on file  Transportation Needs: Not on file  Physical Activity: Not on file  Stress: Not on file  Social Connections: Not on file  Intimate Partner Violence: Not on file    Review of Systems: See HPI, otherwise negative ROS  Physical Exam: Vital signs in last 24 hours: Temp:  [98 F (36.7 C)] 98 F (36.7 C) (08/03 0757) Pulse Rate:  [90] 90 (08/03 0757) Resp:  [16] 16 (08/03 0757) BP: (167)/(86) 167/86 (08/03 0757) SpO2:  [98 %] 98 % (08/03 0757)   General:   Alert,  Well-developed, well-nourished, pleasant and cooperative in NAD Head:  Normocephalic and atraumatic. Eyes:  Sclera clear, no icterus.   Conjunctiva pink. Ears:  Normal auditory acuity. Nose:  No deformity, discharge,  or lesions. Mouth:  No deformity or lesions, dentition normal. Neck:  Supple; no masses or thyromegaly. Lungs:  Clear throughout to auscultation.   No wheezes, crackles, or rhonchi. No acute distress. Heart:  Regular rate and rhythm; no murmurs, clicks, rubs,  or gallops. Abdomen:  Soft, nontender and nondistended. No masses, hepatosplenomegaly or hernias noted. Normal bowel sounds, without guarding, and without rebound.   Msk:  Symmetrical without gross deformities. Normal posture. Extremities:  Without clubbing or edema. Neurologic:  Alert and   oriented x4;  grossly normal neurologically. Skin:  Intact without significant lesions or rashes. Cervical Nodes:  No significant cervical adenopathy. Psych:  Alert and cooperative. Normal mood and affect.  Impression/Plan: Alexandria Webster is here for an EGD for GERD/nausea/vomiting, and colonoscopy to be performed for colon cancer screening purposes.  The risks of the procedure including infection, bleed, or perforation as well as benefits, limitations, alternatives and imponderables have been reviewed with the patient. Questions have been answered. All parties agreeable.

## 2021-10-15 LAB — SURGICAL PATHOLOGY

## 2021-10-15 NOTE — Anesthesia Postprocedure Evaluation (Signed)
Anesthesia Post Note  Patient: Alexandria Webster  Procedure(s) Performed: COLONOSCOPY WITH PROPOFOL ESOPHAGOGASTRODUODENOSCOPY (EGD) WITH PROPOFOL BIOPSY  Patient location during evaluation: Phase II Anesthesia Type: General Level of consciousness: awake Pain management: pain level controlled Vital Signs Assessment: post-procedure vital signs reviewed and stable Respiratory status: spontaneous breathing and respiratory function stable Cardiovascular status: blood pressure returned to baseline and stable Postop Assessment: no headache and no apparent nausea or vomiting Anesthetic complications: no Comments: Late entry   No notable events documented.   Last Vitals:  Vitals:   10/14/21 0757 10/14/21 0915  BP: (!) 167/86 106/68  Pulse: 90 78  Resp: 16 18  Temp: 36.7 C 36.9 C  SpO2: 98% 96%    Last Pain:  Vitals:   10/14/21 0915  TempSrc: Oral  PainSc: [place]

## 2021-10-20 ENCOUNTER — Encounter (HOSPITAL_COMMUNITY): Payer: Self-pay | Admitting: Internal Medicine

## 2021-10-22 DIAGNOSIS — M2569 Stiffness of other specified joint, not elsewhere classified: Secondary | ICD-10-CM | POA: Diagnosis not present

## 2021-10-22 DIAGNOSIS — R531 Weakness: Secondary | ICD-10-CM | POA: Diagnosis not present

## 2021-10-22 DIAGNOSIS — Z9181 History of falling: Secondary | ICD-10-CM | POA: Diagnosis not present

## 2021-10-22 DIAGNOSIS — R2689 Other abnormalities of gait and mobility: Secondary | ICD-10-CM | POA: Diagnosis not present

## 2021-10-27 DIAGNOSIS — M2569 Stiffness of other specified joint, not elsewhere classified: Secondary | ICD-10-CM | POA: Diagnosis not present

## 2021-10-27 DIAGNOSIS — R531 Weakness: Secondary | ICD-10-CM | POA: Diagnosis not present

## 2021-10-27 DIAGNOSIS — R2689 Other abnormalities of gait and mobility: Secondary | ICD-10-CM | POA: Diagnosis not present

## 2021-10-27 DIAGNOSIS — Z9181 History of falling: Secondary | ICD-10-CM | POA: Diagnosis not present

## 2021-11-02 DIAGNOSIS — Z9181 History of falling: Secondary | ICD-10-CM | POA: Diagnosis not present

## 2021-11-02 DIAGNOSIS — R2689 Other abnormalities of gait and mobility: Secondary | ICD-10-CM | POA: Diagnosis not present

## 2021-11-02 DIAGNOSIS — R531 Weakness: Secondary | ICD-10-CM | POA: Diagnosis not present

## 2021-11-02 DIAGNOSIS — M2569 Stiffness of other specified joint, not elsewhere classified: Secondary | ICD-10-CM | POA: Diagnosis not present

## 2021-11-04 DIAGNOSIS — R778 Other specified abnormalities of plasma proteins: Secondary | ICD-10-CM | POA: Diagnosis not present

## 2021-11-04 DIAGNOSIS — R42 Dizziness and giddiness: Secondary | ICD-10-CM | POA: Diagnosis not present

## 2021-11-04 DIAGNOSIS — R809 Proteinuria, unspecified: Secondary | ICD-10-CM | POA: Diagnosis not present

## 2021-11-04 DIAGNOSIS — E1129 Type 2 diabetes mellitus with other diabetic kidney complication: Secondary | ICD-10-CM | POA: Diagnosis not present

## 2021-11-04 DIAGNOSIS — T452X1A Poisoning by vitamins, accidental (unintentional), initial encounter: Secondary | ICD-10-CM | POA: Diagnosis not present

## 2021-11-04 DIAGNOSIS — E790 Hyperuricemia without signs of inflammatory arthritis and tophaceous disease: Secondary | ICD-10-CM | POA: Diagnosis not present

## 2021-11-04 DIAGNOSIS — I5032 Chronic diastolic (congestive) heart failure: Secondary | ICD-10-CM | POA: Diagnosis not present

## 2021-11-04 DIAGNOSIS — E1122 Type 2 diabetes mellitus with diabetic chronic kidney disease: Secondary | ICD-10-CM | POA: Diagnosis not present

## 2021-11-04 DIAGNOSIS — N189 Chronic kidney disease, unspecified: Secondary | ICD-10-CM | POA: Diagnosis not present

## 2021-11-04 DIAGNOSIS — Z951 Presence of aortocoronary bypass graft: Secondary | ICD-10-CM | POA: Diagnosis not present

## 2021-11-04 DIAGNOSIS — I779 Disorder of arteries and arterioles, unspecified: Secondary | ICD-10-CM | POA: Diagnosis not present

## 2021-11-23 DIAGNOSIS — R2689 Other abnormalities of gait and mobility: Secondary | ICD-10-CM | POA: Diagnosis not present

## 2021-11-23 DIAGNOSIS — M2569 Stiffness of other specified joint, not elsewhere classified: Secondary | ICD-10-CM | POA: Diagnosis not present

## 2021-11-23 DIAGNOSIS — R531 Weakness: Secondary | ICD-10-CM | POA: Diagnosis not present

## 2021-11-23 DIAGNOSIS — Z9181 History of falling: Secondary | ICD-10-CM | POA: Diagnosis not present

## 2021-11-25 DIAGNOSIS — Z9181 History of falling: Secondary | ICD-10-CM | POA: Diagnosis not present

## 2021-11-25 DIAGNOSIS — R531 Weakness: Secondary | ICD-10-CM | POA: Diagnosis not present

## 2021-11-25 DIAGNOSIS — M2569 Stiffness of other specified joint, not elsewhere classified: Secondary | ICD-10-CM | POA: Diagnosis not present

## 2021-11-25 DIAGNOSIS — R2689 Other abnormalities of gait and mobility: Secondary | ICD-10-CM | POA: Diagnosis not present

## 2021-11-29 ENCOUNTER — Telehealth: Payer: Self-pay

## 2021-11-29 ENCOUNTER — Other Ambulatory Visit: Payer: Self-pay | Admitting: Gastroenterology

## 2021-11-29 DIAGNOSIS — K219 Gastro-esophageal reflux disease without esophagitis: Secondary | ICD-10-CM

## 2021-11-29 MED ORDER — ESOMEPRAZOLE MAGNESIUM 40 MG PO CPDR: 40.0000 mg | DELAYED_RELEASE_CAPSULE | Freq: Every day | ORAL | 4 refills | Status: DC

## 2021-11-29 NOTE — Telephone Encounter (Signed)
Pt called needing a refill of her Nexium sent to [facility]. Pt's last ov was in June 2023

## 2021-11-29 NOTE — Telephone Encounter (Signed)
Phoned and advised the pt of her Rx for Nexium being sent to [facility]. Pt expressed understanding

## 2021-11-30 ENCOUNTER — Telehealth: Payer: Self-pay | Admitting: *Deleted

## 2021-11-30 NOTE — Telephone Encounter (Signed)
Received approval letter for Esomeprazole '40mg'$ .

## 2021-12-08 DIAGNOSIS — R2689 Other abnormalities of gait and mobility: Secondary | ICD-10-CM | POA: Diagnosis not present

## 2021-12-08 DIAGNOSIS — M2569 Stiffness of other specified joint, not elsewhere classified: Secondary | ICD-10-CM | POA: Diagnosis not present

## 2021-12-08 DIAGNOSIS — R531 Weakness: Secondary | ICD-10-CM | POA: Diagnosis not present

## 2021-12-08 DIAGNOSIS — Z9181 History of falling: Secondary | ICD-10-CM | POA: Diagnosis not present

## 2022-01-04 DIAGNOSIS — J069 Acute upper respiratory infection, unspecified: Secondary | ICD-10-CM | POA: Diagnosis not present

## 2022-01-04 DIAGNOSIS — Z6822 Body mass index (BMI) 22.0-22.9, adult: Secondary | ICD-10-CM | POA: Diagnosis not present

## 2022-01-04 DIAGNOSIS — Z993 Dependence on wheelchair: Secondary | ICD-10-CM | POA: Diagnosis not present

## 2022-01-06 DIAGNOSIS — R809 Proteinuria, unspecified: Secondary | ICD-10-CM | POA: Diagnosis not present

## 2022-01-06 DIAGNOSIS — Z951 Presence of aortocoronary bypass graft: Secondary | ICD-10-CM | POA: Diagnosis not present

## 2022-01-06 DIAGNOSIS — E1129 Type 2 diabetes mellitus with other diabetic kidney complication: Secondary | ICD-10-CM | POA: Diagnosis not present

## 2022-01-06 DIAGNOSIS — R778 Other specified abnormalities of plasma proteins: Secondary | ICD-10-CM | POA: Diagnosis not present

## 2022-01-06 DIAGNOSIS — I5032 Chronic diastolic (congestive) heart failure: Secondary | ICD-10-CM | POA: Diagnosis not present

## 2022-01-06 DIAGNOSIS — T452X1A Poisoning by vitamins, accidental (unintentional), initial encounter: Secondary | ICD-10-CM | POA: Diagnosis not present

## 2022-01-06 DIAGNOSIS — E790 Hyperuricemia without signs of inflammatory arthritis and tophaceous disease: Secondary | ICD-10-CM | POA: Diagnosis not present

## 2022-01-06 DIAGNOSIS — R42 Dizziness and giddiness: Secondary | ICD-10-CM | POA: Diagnosis not present

## 2022-01-06 DIAGNOSIS — N189 Chronic kidney disease, unspecified: Secondary | ICD-10-CM | POA: Diagnosis not present

## 2022-01-06 DIAGNOSIS — I779 Disorder of arteries and arterioles, unspecified: Secondary | ICD-10-CM | POA: Diagnosis not present

## 2022-01-06 DIAGNOSIS — E1122 Type 2 diabetes mellitus with diabetic chronic kidney disease: Secondary | ICD-10-CM | POA: Diagnosis not present

## 2022-01-12 DIAGNOSIS — I5032 Chronic diastolic (congestive) heart failure: Secondary | ICD-10-CM | POA: Diagnosis not present

## 2022-01-12 DIAGNOSIS — N189 Chronic kidney disease, unspecified: Secondary | ICD-10-CM | POA: Diagnosis not present

## 2022-01-12 DIAGNOSIS — E1129 Type 2 diabetes mellitus with other diabetic kidney complication: Secondary | ICD-10-CM | POA: Diagnosis not present

## 2022-01-12 DIAGNOSIS — R809 Proteinuria, unspecified: Secondary | ICD-10-CM | POA: Diagnosis not present

## 2022-01-12 DIAGNOSIS — E876 Hypokalemia: Secondary | ICD-10-CM | POA: Diagnosis not present

## 2022-01-12 DIAGNOSIS — E1122 Type 2 diabetes mellitus with diabetic chronic kidney disease: Secondary | ICD-10-CM | POA: Diagnosis not present

## 2022-01-12 DIAGNOSIS — T452X1D Poisoning by vitamins, accidental (unintentional), subsequent encounter: Secondary | ICD-10-CM | POA: Diagnosis not present

## 2022-01-14 DIAGNOSIS — M2569 Stiffness of other specified joint, not elsewhere classified: Secondary | ICD-10-CM | POA: Diagnosis not present

## 2022-01-14 DIAGNOSIS — R531 Weakness: Secondary | ICD-10-CM | POA: Diagnosis not present

## 2022-01-14 DIAGNOSIS — Z9181 History of falling: Secondary | ICD-10-CM | POA: Diagnosis not present

## 2022-01-14 DIAGNOSIS — R2689 Other abnormalities of gait and mobility: Secondary | ICD-10-CM | POA: Diagnosis not present

## 2022-01-19 NOTE — Progress Notes (Unsigned)
GI Office Note    Referring Provider: Jake Samples, PA* Primary Care Physician:  Scherrie Bateman Primary Gastroenterologist: Elon Alas. Abbey Chatters, DO   Date:  01/20/2022  ID:  Alexandria Webster, DOB 1965-04-20, MRN [id number]   Chief Complaint   Chief Complaint  Patient presents with   Follow-up    Doing better, still has nausea/vomiting at times but not as bad.     History of Present Illness  Alexandria Webster is a 56 y.o. female with a history of asthma, COPD, diabetes, gastroparesis, GERD, HTN, hypothyroidism, and PVD presenting today for follow up post procedures.   Initial consultation 09/09/21. Reported constipation controlled with miralax and colace with 1-2 bowel movements daily. Primary complaint was nausea and vomiting. Having bilious emesis. Emesis is usually post prandial and at the time of her visit it had improved to 1-2 times per week and felt like it was related to metformin. Less of appetite with trulicity. Was taking nexium once daily. Reportedly was diagnosed with gastroparesis by PCP. Never had a colonoscopy and was due for CRC screening. Scheduled for TCS and EGD.  EGD 10/14/21: - small hiatal hernia - normal esophagus - gastritis s/p biopsy (normal mucosa) - normal duodenum - Advised PPI daily.   Colonoscopy 10/14/21: - Normal - Repeat in 10 years  Labs 01/06/22: potasium 3.3, Cr 1.17, GFR 54, vitamin D 128, phos 2.8, hgb 13.1, plts 213  Today: Has had 2 episodes of vomiting this week. Usually happens in the evenings. Vomiting comes randomly at times without nausea. Usually does occur after she eats. Eating chicken, beans, and other vegetables. Rarely eats fried or greasy foods. When she eats milk or cheese she will have a lot of nausea and vomiting. Taking generic nexium, now taking it twice a day and sees good improvement. Has lost 9-10 lbs since her last visit. Appetite is hit or miss. Is on Trulicity. Has issues with protein intake and water  intake. Chicken not bothering her as much. Does like shrimp and crab.   If she uses miralax she goes a little more and is taking the colace daily. Goignm on her own for the most part. No melena, BRBPR, abdominal pain.   Last A1c was a 6.   Current Outpatient Medications  Medication Sig Dispense Refill   albuterol (VENTOLIN HFA) 108 (90 Base) MCG/ACT inhaler Inhale 1-2 puffs into the lungs every 4 (four) hours as needed for wheezing or shortness of breath.     aspirin EC 81 MG tablet Take 324 mg by mouth daily.     cetirizine (ZYRTEC) 10 MG tablet Take 10 mg by mouth daily.     docusate sodium (COLACE) 100 MG capsule Take 100 mg by mouth daily.     esomeprazole (NEXIUM) 40 MG capsule Take 1 capsule (40 mg total) by mouth daily. 30 capsule 4   furosemide (LASIX) 20 MG tablet Take 1 tablet (20 mg total) by mouth every other day. (Patient taking differently: Take 20 mg by mouth daily.)  0   glimepiride (AMARYL) 1 MG tablet Take 1 mg by mouth daily.     glucose blood (ACCU-CHEK AVIVA) test strip Test glucose 4 times a day 150 each 3   Insulin Pen Needle (B-D UF III MINI PEN NEEDLES) 31G X 5 MM MISC Use qhs 100 each 5   levothyroxine (SYNTHROID, LEVOTHROID) 25 MCG tablet Take 25 mcg by mouth daily before breakfast.     lisinopril (ZESTRIL) 5 MG tablet Take  5 mg by mouth daily.     lovastatin (MEVACOR) 20 MG tablet Take 20 mg by mouth at bedtime.     meclizine (ANTIVERT) 25 MG tablet Take 25 mg by mouth 3 (three) times daily as needed.     meloxicam (MOBIC) 7.5 MG tablet Take 1 tablet (7.5 mg total) by mouth daily as needed for pain. (Patient taking differently: Take 7.5 mg by mouth in the morning and at bedtime.)     polyethylene glycol powder (MIRALAX) 17 GM/SCOOP powder Take 17 g by mouth daily.     traZODone (DESYREL) 100 MG tablet Take 100 mg by mouth daily.     TRULICITY 1.5 PN/3.6RW SOPN Inject 1.5 mg into the skin every Sunday.     No current facility-administered medications for this  visit.    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 N/A 09/27/2018   Procedure: Redo Exposure  AORTOBIFEMORAL Bypass and bilateral femoral Artery ; Redo Aortobifemoral  BYPASS GRAFT.;  Surgeon: Marty Heck, MD;  Location: [facility];  Service: Vascular;  Laterality: N/A;   BACK SURGERY     BIOPSY  10/14/2021   Procedure: BIOPSY;  Surgeon: Eloise Harman, DO;  [facility]: [facility];  Service: Endoscopy;;   COLONOSCOPY WITH PROPOFOL N/A 10/14/2021   Procedure: COLONOSCOPY WITH PROPOFOL;  Surgeon: Eloise Harman, DO;  Location: [facility];  Service: Endoscopy;  Laterality: N/A;  8:30am   CORONARY ANGIOPLASTY WITH STENT PLACEMENT     ESOPHAGOGASTRODUODENOSCOPY (EGD) WITH PROPOFOL N/A 10/14/2021   Procedure: ESOPHAGOGASTRODUODENOSCOPY (EGD) WITH PROPOFOL;  Surgeon: Eloise Harman, DO;  Location: [facility];  Service: Endoscopy;  Laterality: N/A;   FEMORAL ARTERY STENT  right leg   TUBAL LIGATION      Family History  Problem Relation Age of Onset   Stroke Mother    Hypertension Mother    Heart failure Father    Asthma Father    Diabetes Father    Heart disease Father    Emphysema Paternal Grandfather    Colon cancer Neg Hx    Colon polyps Neg Hx     Allergies as of 01/20/2022   (No Known Allergies)    Social History   Socioeconomic History   Marital status: Single    Spouse name: Not on file   Number of children: Not on file   Years of education: Not on file   Highest education level: Not on file  Occupational History   Not on  file  Tobacco Use   Smoking status: Former    Packs/day: 0.00    Years: 36.00    Total pack years: 0.00    Types: Cigarettes    Quit date: 03/14/2018    Years since quitting: 3.8    Passive exposure: Past   Smokeless tobacco: Current    Types: Snuff  Vaping Use   Vaping Use: Never used  Substance and Sexual Activity   Alcohol use: No   Drug use: No  Sexual activity: Yes    Birth control/protection: Surgical    Comment: tubal  Other Topics Concern   Not on file  Social History Narrative   Not on file   Social Determinants of Health   Financial Resource Strain: Not on file  Food Insecurity: Not on file  Transportation Needs: Not on file  Physical Activity: Not on file  Stress: Not on file  Social Connections: Not on file     Review of Systems   Gen: Denies fever, chills, anorexia. Denies fatigue, weakness, weight loss.  CV: Denies chest pain, palpitations, syncope, peripheral edema, and claudication. Resp: Denies dyspnea at rest, cough, wheezing, coughing up blood, and pleurisy. GI: See HPI Derm: Denies rash, itching, dry skin Psych: Denies depression, anxiety, memory loss, confusion. No homicidal or suicidal ideation.  Heme: Denies bruising, bleeding, and enlarged lymph nodes.   Physical Exam   BP 91/62 (BP Location: Right Arm, Patient Position: Sitting, Cuff Size: Normal)   Pulse 87   Temp (!) 97.3 F (36.3 C) (Oral)   Ht '5\' 4"'$  (1.626 m)   Wt 123 lb 3.2 oz (55.9 kg)   LMP 01/21/2012   SpO2 97%   BMI 21.15 kg/m   General:   Alert and oriented. No distress noted. Pleasant and cooperative. Frail appearing Head:  Normocephalic and atraumatic. Eyes:  Conjuctiva clear without scleral icterus. Lungs:  Clear to auscultation bilaterally. No wheezes, rales, or rhonchi. No distress.  Heart:  S1, S2 present without murmurs appreciated.  Abdomen:  +BS, soft, non-tender and non-distended. No rebound or guarding. No HSM or masses noted. Rectal: deferred Msk:   Symmetrical without gross deformities. Normal posture. Extremities:  Without edema. Neurologic:  Alert and  oriented x4 Psych:  Alert and cooperative. Normal mood and affect.   Assessment  Alexandria Webster is a 56 y.o. female with a history of asthma, COPD, diabetes, gastroparesis, GERD, HTN, hypothyroidism, and PVD presenting today for follow up post procedures.   GERD: Has seen improvement in symptoms with Nexium 40 mg twice daily.  Recent EGD in August 2023 with gastritis, normal duodenum and normal esophagus.  Per report she was advised to continue PPI daily however she states she is taking it twice daily, will update prescription.   Nausea/vomiting, weight loss: Reported prior history of gastroparesis per PCP.  Has been doing well with twice daily dosing of Nexium.  Has only had 2 episodes of emesis this week but overall significantly improved from prior.  Has been able to eat more meat recently.  Notices dairy products are particularly more bothersome for her.  Continues to have a lack of appetite but this is likely secondary to her Trulicity.  Weight at her last appointment was 132 pounds, she is 123 today.  We discussed increasing protein in her diet to help maintain weight and prevent any further weight loss.  I suspect this is primarily related to her lack of appetite secondary to medications.  She tends to have more vomiting rather than nausea.  Constipation: Currently fairly well controlled on Colace daily.  Will take MiraLAX occasionally as needed.  No significant need to strain as of recently and is typically having a bowel movement at least every day if not twice a day.   PLAN   Continue esomeprazole 40 mg twice daily, prescription updated. Continue miralax and colace daily Protein supplements, high protein diet GERD diet/modifications Avoid lactose products, try to find a lactose-free protein supplement to be mixed with water. Monitor weights, goal  weight 115-130 lbs Follow up  in 6 months, sooner if needed.     Venetia Night, MSN, FNP-BC, AGACNP-BC [facility]

## 2022-01-20 ENCOUNTER — Ambulatory Visit (INDEPENDENT_AMBULATORY_CARE_PROVIDER_SITE_OTHER): Payer: 59 | Admitting: Gastroenterology

## 2022-01-20 ENCOUNTER — Encounter: Payer: Self-pay | Admitting: Gastroenterology

## 2022-01-20 VITALS — BP 91/62 | HR 87 | Temp 97.3°F | Ht 64.0 in | Wt 123.2 lb

## 2022-01-20 DIAGNOSIS — R112 Nausea with vomiting, unspecified: Secondary | ICD-10-CM

## 2022-01-20 DIAGNOSIS — K219 Gastro-esophageal reflux disease without esophagitis: Secondary | ICD-10-CM | POA: Diagnosis not present

## 2022-01-20 DIAGNOSIS — K5900 Constipation, unspecified: Secondary | ICD-10-CM | POA: Diagnosis not present

## 2022-01-20 MED ORDER — ESOMEPRAZOLE MAGNESIUM 40 MG PO CPDR: 40.0000 mg | DELAYED_RELEASE_CAPSULE | Freq: Two times a day (BID) | ORAL | 4 refills | Status: DC

## 2022-01-20 NOTE — Patient Instructions (Addendum)
Continue Nexium 40 mg twice daily.   Follow a GERD diet:  Avoid fried, fatty, greasy, spicy, citrus foods. Avoid caffeine and carbonated beverages. Avoid chocolate. Try eating 4-6 small meals a day rather than 3 large meals. Do not eat within 3 hours of laying down. Prop head of bed up on wood or bricks to create a 6 inch incline.   Continue your Colace daily and MiraLAX as needed.  If your constipation begins to worsen please resume MiraLAX 1 capful daily.  Continue to try to increase your protein intake, looking for a protein powder that you can mix with water that has a decent taste to it.  Plan to drink at least 1 of these per day to help at least maintain your weight.  Monitor your weight once weekly or once every 2 weeks and look for any significant decreases.  I would prefer for you to stay around the 115 to 130 pound range.  If your vomiting becomes more frequent, please reach out and we may can add some additional medications to your reflux regimen as I suspect your vomiting is primarily related to your reflux.  We will have you follow-up in about 6 months, or sooner if needed!  I hope you have a wonderful upcoming holiday season!  It was a pleasure to see you today. I want to create trusting relationships with patients. If you receive a survey regarding your visit,  I greatly appreciate you taking time to fill this out on paper or through your MyChart. I value your feedback.  Venetia Night, MSN, FNP-BC, AGACNP-BC [facility]

## 2022-01-30 DIAGNOSIS — R531 Weakness: Secondary | ICD-10-CM | POA: Diagnosis not present

## 2022-01-30 DIAGNOSIS — R2689 Other abnormalities of gait and mobility: Secondary | ICD-10-CM | POA: Diagnosis not present

## 2022-01-30 DIAGNOSIS — Z9181 History of falling: Secondary | ICD-10-CM | POA: Diagnosis not present

## 2022-01-30 DIAGNOSIS — M2569 Stiffness of other specified joint, not elsewhere classified: Secondary | ICD-10-CM | POA: Diagnosis not present

## 2022-02-02 DIAGNOSIS — E782 Mixed hyperlipidemia: Secondary | ICD-10-CM | POA: Diagnosis not present

## 2022-02-02 DIAGNOSIS — Z23 Encounter for immunization: Secondary | ICD-10-CM | POA: Diagnosis not present

## 2022-02-02 DIAGNOSIS — Z6827 Body mass index (BMI) 27.0-27.9, adult: Secondary | ICD-10-CM | POA: Diagnosis not present

## 2022-02-02 DIAGNOSIS — E119 Type 2 diabetes mellitus without complications: Secondary | ICD-10-CM | POA: Diagnosis not present

## 2022-02-02 DIAGNOSIS — E118 Type 2 diabetes mellitus with unspecified complications: Secondary | ICD-10-CM | POA: Diagnosis not present

## 2022-02-02 DIAGNOSIS — I10 Essential (primary) hypertension: Secondary | ICD-10-CM | POA: Diagnosis not present

## 2022-02-02 DIAGNOSIS — E039 Hypothyroidism, unspecified: Secondary | ICD-10-CM | POA: Diagnosis not present

## 2022-02-02 DIAGNOSIS — K219 Gastro-esophageal reflux disease without esophagitis: Secondary | ICD-10-CM | POA: Diagnosis not present

## 2022-04-26 DIAGNOSIS — E039 Hypothyroidism, unspecified: Secondary | ICD-10-CM | POA: Diagnosis not present

## 2022-05-05 DIAGNOSIS — Z993 Dependence on wheelchair: Secondary | ICD-10-CM | POA: Diagnosis not present

## 2022-05-05 DIAGNOSIS — J9801 Acute bronchospasm: Secondary | ICD-10-CM | POA: Diagnosis not present

## 2022-05-05 DIAGNOSIS — E663 Overweight: Secondary | ICD-10-CM | POA: Diagnosis not present

## 2022-05-05 DIAGNOSIS — R69 Illness, unspecified: Secondary | ICD-10-CM | POA: Diagnosis not present

## 2022-05-05 DIAGNOSIS — I10 Essential (primary) hypertension: Secondary | ICD-10-CM | POA: Diagnosis not present

## 2022-05-05 DIAGNOSIS — Z6827 Body mass index (BMI) 27.0-27.9, adult: Secondary | ICD-10-CM | POA: Diagnosis not present

## 2022-05-05 DIAGNOSIS — I739 Peripheral vascular disease, unspecified: Secondary | ICD-10-CM | POA: Diagnosis not present

## 2022-05-05 DIAGNOSIS — E119 Type 2 diabetes mellitus without complications: Secondary | ICD-10-CM | POA: Diagnosis not present

## 2022-05-05 DIAGNOSIS — E782 Mixed hyperlipidemia: Secondary | ICD-10-CM | POA: Diagnosis not present

## 2022-05-13 DIAGNOSIS — E876 Hypokalemia: Secondary | ICD-10-CM | POA: Diagnosis not present

## 2022-05-13 DIAGNOSIS — E1129 Type 2 diabetes mellitus with other diabetic kidney complication: Secondary | ICD-10-CM | POA: Diagnosis not present

## 2022-05-13 DIAGNOSIS — N189 Chronic kidney disease, unspecified: Secondary | ICD-10-CM | POA: Diagnosis not present

## 2022-05-13 DIAGNOSIS — R809 Proteinuria, unspecified: Secondary | ICD-10-CM | POA: Diagnosis not present

## 2022-05-13 DIAGNOSIS — T452X1D Poisoning by vitamins, accidental (unintentional), subsequent encounter: Secondary | ICD-10-CM | POA: Diagnosis not present

## 2022-05-13 DIAGNOSIS — I5032 Chronic diastolic (congestive) heart failure: Secondary | ICD-10-CM | POA: Diagnosis not present

## 2022-05-13 DIAGNOSIS — E1122 Type 2 diabetes mellitus with diabetic chronic kidney disease: Secondary | ICD-10-CM | POA: Diagnosis not present

## 2022-06-20 ENCOUNTER — Encounter (HOSPITAL_COMMUNITY): Payer: Self-pay

## 2022-06-20 ENCOUNTER — Emergency Department (HOSPITAL_COMMUNITY): Payer: 59

## 2022-06-20 ENCOUNTER — Other Ambulatory Visit: Payer: Self-pay

## 2022-06-20 ENCOUNTER — Emergency Department (HOSPITAL_COMMUNITY)
Admission: EM | Admit: 2022-06-20 | Discharge: 2022-06-20 | Disposition: A | Discharge status: Home / Self Care | Payer: 59 | Attending: Emergency Medicine | Admitting: Emergency Medicine

## 2022-06-20 DIAGNOSIS — I10 Essential (primary) hypertension: Secondary | ICD-10-CM | POA: Insufficient documentation

## 2022-06-20 DIAGNOSIS — E039 Hypothyroidism, unspecified: Secondary | ICD-10-CM | POA: Diagnosis not present

## 2022-06-20 DIAGNOSIS — R200 Anesthesia of skin: Secondary | ICD-10-CM

## 2022-06-20 DIAGNOSIS — H9190 Unspecified hearing loss, unspecified ear: Secondary | ICD-10-CM | POA: Insufficient documentation

## 2022-06-20 DIAGNOSIS — G9589 Other specified diseases of spinal cord: Secondary | ICD-10-CM | POA: Diagnosis not present

## 2022-06-20 DIAGNOSIS — J449 Chronic obstructive pulmonary disease, unspecified: Secondary | ICD-10-CM | POA: Diagnosis not present

## 2022-06-20 DIAGNOSIS — E119 Type 2 diabetes mellitus without complications: Secondary | ICD-10-CM | POA: Diagnosis not present

## 2022-06-20 DIAGNOSIS — J45909 Unspecified asthma, uncomplicated: Secondary | ICD-10-CM | POA: Insufficient documentation

## 2022-06-20 DIAGNOSIS — Z7984 Long term (current) use of oral hypoglycemic drugs: Secondary | ICD-10-CM | POA: Insufficient documentation

## 2022-06-20 DIAGNOSIS — Z7982 Long term (current) use of aspirin: Secondary | ICD-10-CM | POA: Diagnosis not present

## 2022-06-20 DIAGNOSIS — Z79899 Other long term (current) drug therapy: Secondary | ICD-10-CM | POA: Insufficient documentation

## 2022-06-20 DIAGNOSIS — G459 Transient cerebral ischemic attack, unspecified: Secondary | ICD-10-CM | POA: Diagnosis not present

## 2022-06-20 DIAGNOSIS — M4802 Spinal stenosis, cervical region: Secondary | ICD-10-CM | POA: Insufficient documentation

## 2022-06-20 DIAGNOSIS — R519 Headache, unspecified: Secondary | ICD-10-CM | POA: Diagnosis not present

## 2022-06-20 DIAGNOSIS — Z6825 Body mass index (BMI) 25.0-25.9, adult: Secondary | ICD-10-CM | POA: Diagnosis not present

## 2022-06-20 DIAGNOSIS — M47812 Spondylosis without myelopathy or radiculopathy, cervical region: Secondary | ICD-10-CM | POA: Diagnosis not present

## 2022-06-20 DIAGNOSIS — M5030 Other cervical disc degeneration, unspecified cervical region: Secondary | ICD-10-CM

## 2022-06-20 LAB — COMPREHENSIVE METABOLIC PANEL
ALT: 10 U/L (ref 0–44)
AST: 12 U/L — ABNORMAL LOW (ref 15–41)
Albumin: 3.6 g/dL (ref 3.5–5.0)
Alkaline Phosphatase: 58 U/L (ref 38–126)
Anion gap: 8 (ref 5–15)
BUN: 24 mg/dL — ABNORMAL HIGH (ref 6–20)
CO2: 29 mmol/L (ref 22–32)
Calcium: 9.2 mg/dL (ref 8.9–10.3)
Chloride: 101 mmol/L (ref 98–111)
Creatinine, Ser: 1 mg/dL (ref 0.44–1.00)
GFR, Estimated: >60 mL/min (ref >60)
Glucose, Bld: 230 mg/dL — ABNORMAL HIGH (ref 70–99)
Potassium: 4.1 mmol/L (ref 3.5–5.1)
Sodium: 138 mmol/L (ref 135–145)
Total Bilirubin: 0.5 mg/dL (ref 0.3–1.2)
Total Protein: 7.1 g/dL (ref 6.5–8.1)

## 2022-06-20 LAB — PROTIME-INR
INR: 1.1 (ref 0.8–1.2)
Prothrombin Time: 13.7 seconds (ref 11.4–15.2)

## 2022-06-20 LAB — CBC
HCT: 36.6 % (ref 36.0–46.0)
Hemoglobin: 12.6 g/dL (ref 12.0–15.0)
MCH: 33.4 pg (ref 26.0–34.0)
MCHC: 34.4 g/dL (ref 30.0–36.0)
MCV: 97.1 fL (ref 80.0–100.0)
Platelets: 201 10*3/uL (ref 150–400)
RBC: 3.77 MIL/uL — ABNORMAL LOW (ref 3.87–5.11)
RDW: 11.9 % (ref 11.5–15.5)
WBC: 6.4 10*3/uL (ref 4.0–10.5)
nRBC: 0 % (ref 0.0–0.2)

## 2022-06-20 LAB — TSH: TSH: 1.411 u[IU]/mL (ref 0.350–4.500)

## 2022-06-20 LAB — DIFFERENTIAL
Abs Immature Granulocytes: 0.02 10*3/uL (ref 0.00–0.07)
Basophils Absolute: 0 10*3/uL (ref 0.0–0.1)
Basophils Relative: 1 %
Eosinophils Absolute: 0.1 10*3/uL (ref 0.0–0.5)
Eosinophils Relative: 2 %
Immature Granulocytes: 0 %
Lymphocytes Relative: 22 %
Lymphs Abs: 1.4 10*3/uL (ref 0.7–4.0)
Monocytes Absolute: 0.4 10*3/uL (ref 0.1–1.0)
Monocytes Relative: 6 %
Neutro Abs: 4.4 10*3/uL (ref 1.7–7.7)
Neutrophils Relative %: 69 %

## 2022-06-20 LAB — CBG MONITORING, ED
Glucose-Capillary: 300 mg/dL — ABNORMAL HIGH (ref 70–99)
Glucose-Capillary: 234 mg/dL — ABNORMAL HIGH (ref 70–99)

## 2022-06-20 LAB — ETHANOL: Alcohol, Ethyl (B): <10 mg/dL (ref <10)

## 2022-06-20 LAB — APTT: aPTT: 25 seconds (ref 24–36)

## 2022-06-20 MED ORDER — PREDNISONE 10 MG (21) PO TBPK: ORAL_TABLET | Freq: Every day | ORAL | 0 refills | Status: DC

## 2022-06-20 MED ORDER — PREDNISONE 50 MG PO TABS
60.0000 mg | ORAL_TABLET | Freq: Once | ORAL | Status: AC
  Administered 2022-06-20: 60 mg via ORAL
  Filled 2022-06-20: qty 1

## 2022-06-20 NOTE — Discharge Instructions (Signed)
Call a telephone number below to schedule a close follow-up appointment with the spinal doctors.  A prescription for a steroid was sent to your pharmacy.  You did receive your first dose here in the emergency department.  Take as prescribed starting tomorrow.  Return the emergency department for any new or worsening symptoms of concern.

## 2022-06-20 NOTE — ED Triage Notes (Addendum)
Pt presents POV coming from her PCP with c/o HA x 1 wk and intermittent numbness to L tongue. Pt states LKW was "2 weeks ago." Pt is a poor historian, states the L sided numbness is intermittent, yet states it has been there for the 2 weeks. BEFAST neuro exam in triage only reveals L sided numbness to face, arms, legs without weakness.  Pt's PCP called to notify us they were sending this pt for a TIA work up.

## 2022-06-20 NOTE — ED Provider Notes (Signed)
[place] EMERGENCY DEPARTMENT AT [facility] Provider Note   CSN: [id number] Arrival date & time: 06/20/22  1307     History  Chief Complaint  Patient presents with   Transient Ischemic Attack    Alexandria CuretBrenda L Webster is a 57 y.o. female.  HPI Patient presents for intermittent left-sided numbness.  Medical history includes asthma, COPD, DM, GERD, gastroparesis, HLD, HTN, hypothyroidism.  Patient reports a fall 1 year ago.  Since that time, she has required a walker to ambulate.  She states that she initially had some intermittent right-sided numbness.  Over the past week, she has experienced left-sided numbness.  This has been intermittent.  She currently endorses numbness to left side of tongue as well as upper and lower extremities.  She denies any associated weakness.  She has not had any increased difficulty walking.  Daughter, who is present at bedside, reports intermittent word finding difficulty.  Has no known history of CVA.  She was seen by her primary care doctor today who sent her to the ED for a TIA workup.  Current home medications include baby aspirin without any other antiplatelet or anticoagulation medications.    Home Medications Prior to Admission medications   Medication Sig Start Date End Date Taking? Authorizing Provider  predniSONE (STERAPRED UNI-PAK 21 TAB) 10 MG (21) TBPK tablet Take by mouth daily. Take 6 tabs by mouth daily  for 2 days, then 5 tabs for 2 days, then 4 tabs for 2 days, then 3 tabs for 2 days, 2 tabs for 2 days, then 1 tab by mouth daily for 2 days 06/20/22  Yes Gloris Manchesterixon, Maryl Blalock, MD  albuterol (VENTOLIN HFA) 108 (90 Base) MCG/ACT inhaler Inhale 1-2 puffs into the lungs every 4 (four) hours as needed for wheezing or shortness of breath. 09/01/21   [provider]  aspirin EC 81 MG tablet Take 324 mg by mouth daily.    [provider]  cetirizine (ZYRTEC) 10 MG tablet Take 10 mg by mouth daily. 07/23/21   [provider]   docusate sodium (COLACE) 100 MG capsule Take 100 mg by mouth daily.    [provider]  esomeprazole (NEXIUM) 40 MG capsule Take 1 capsule (40 mg total) by mouth 2 (two) times daily before a meal. 01/20/22   Mahon, Frederik Schmidtourtney L, NP  furosemide (LASIX) 20 MG tablet Take 1 tablet (20 mg total) by mouth every other day. Patient taking differently: Take 20 mg by mouth daily. 08/12/21   Johnson, Clanford L, MD  glimepiride (AMARYL) 1 MG tablet Take 1 mg by mouth daily. 08/18/21   [provider]  glucose blood (ACCU-CHEK AVIVA) test strip Test glucose 4 times a day 09/28/15   Roma KayserNida, Gebreselassie W, MD  Insulin Pen Needle (B-D UF III MINI PEN NEEDLES) 31G X 5 MM MISC Use qhs 09/17/15   Roma KayserNida, Gebreselassie W, MD  levothyroxine (SYNTHROID, LEVOTHROID) 25 MCG tablet Take 25 mcg by mouth daily before breakfast.    [provider]  lisinopril (ZESTRIL) 5 MG tablet Take 5 mg by mouth daily. 07/23/21   [provider]  lovastatin (MEVACOR) 20 MG tablet Take 20 mg by mouth at bedtime.    [provider]  meclizine (ANTIVERT) 25 MG tablet Take 25 mg by mouth 3 (three) times daily as needed. 01/12/22   [provider]  meloxicam (MOBIC) 7.5 MG tablet Take 1 tablet (7.5 mg total) by mouth daily as needed for pain. Patient taking differently: Take 7.5 mg  by mouth in the morning and at bedtime. 08/08/21   Johnson, Clanford L, MD  polyethylene glycol powder (MIRALAX) 17 GM/SCOOP powder Take 17 g by mouth daily.    [provider]  traZODone (DESYREL) 100 MG tablet Take 100 mg by mouth daily. 07/29/21   [provider]  TRULICITY 1.5 MG/0.5ML SOPN Inject 1.5 mg into the skin every Sunday. 07/29/21   [provider]      Allergies    Patient has no known allergies.    Review of Systems   Review of Systems  HENT:  Positive for hearing loss.   Neurological:  Positive for numbness.  All other systems reviewed and are negative.   Physical  Exam Updated Vital Signs BP (!) 160/72   Pulse 67   Temp 98.2 F (36.8 C) (Oral)   Resp 15   Ht 5\' 4"  (1.626 m)   Wt 56.2 kg   LMP 01/21/2012   SpO2 100%   BMI 21.28 kg/m  Physical Exam Vitals and nursing note reviewed.  Constitutional:      General: She is not in acute distress.    Appearance: Normal appearance. She is well-developed. She is not ill-appearing, toxic-appearing or diaphoretic.  HENT:     Head: Normocephalic and atraumatic.     Right Ear: External ear normal.     Left Ear: External ear normal.     Nose: Nose normal.     Mouth/Throat:     Mouth: Mucous membranes are moist.  Eyes:     Extraocular Movements: Extraocular movements intact.     Conjunctiva/sclera: Conjunctivae normal.  Cardiovascular:     Rate and Rhythm: Normal rate and regular rhythm.     Heart sounds: No murmur heard. Pulmonary:     Effort: Pulmonary effort is normal. No respiratory distress.     Breath sounds: Normal breath sounds.  Abdominal:     General: There is no distension.     Palpations: Abdomen is soft.     Tenderness: There is no abdominal tenderness.  Musculoskeletal:        General: No swelling. Normal range of motion.     Cervical back: Normal range of motion and neck supple.     Right lower leg: No edema.     Left lower leg: No edema.  Skin:    General: Skin is warm and dry.     Capillary Refill: Capillary refill takes less than 2 seconds.     Coloration: Skin is not jaundiced or pale.  Neurological:     Mental Status: She is alert and oriented to person, place, and time.     Cranial Nerves: No cranial nerve deficit, dysarthria or facial asymmetry.     Sensory: Sensory deficit (Diminished sensation to left hemibody) present.     Motor: Motor function is intact. No weakness or pronator drift.     Coordination: Coordination is intact. Coordination normal. Finger-Nose-Finger Test normal.  Psychiatric:        Mood and Affect: Mood normal.        Behavior: Behavior normal.      ED Results / Procedures / Treatments   Labs (all labs ordered are listed, but only abnormal results are displayed) Labs Reviewed  CBC - Abnormal; Notable for the following components:      Result Value   RBC 3.77 (*)    All other components within normal limits  COMPREHENSIVE METABOLIC PANEL - Abnormal; Notable for the following components:   Glucose, Bld  230 (*)    BUN 24 (*)    AST 12 (*)    All other components within normal limits  CBG MONITORING, ED - Abnormal; Notable for the following components:   Glucose-Capillary 300 (*)    All other components within normal limits  CBG MONITORING, ED - Abnormal; Notable for the following components:   Glucose-Capillary 234 (*)    All other components within normal limits  PROTIME-INR  APTT  DIFFERENTIAL  ETHANOL  TSH  I-STAT CHEM 8, ED    EKG EKG Interpretation  Date/Time:  Monday June 20 2022 13:19:22 EDT Ventricular Rate:  83 PR Interval:  152 QRS Duration: 76 QT Interval:  356 QTC Calculation: 418 R Axis:   83 Text Interpretation: Normal sinus rhythm Confirmed by Gloris Manchester [phone]) on 06/20/2022 3:49:24 PM  Radiology MR BRAIN WO CONTRAST  Result Date: 06/20/2022 CLINICAL DATA:  Initial evaluation for TIA. EXAM: MRI HEAD WITHOUT CONTRAST TECHNIQUE: Multiplanar, multiecho pulse sequences of the brain and surrounding structures were obtained without intravenous contrast. COMPARISON:  CT from earlier the same day. FINDINGS: Brain: Examination degraded by motion artifact. Generalized age-related cerebral atrophy. Patchy T2/FLAIR hyperintensity involving the periventricular deep white matter both cerebral hemispheres, consistent with chronic small vessel ischemic disease. Few small remote lacunar infarcts present about the thalami. Few small remote bilateral cerebellar infarcts noted. No evidence for acute or subacute infarct. Gray-white matter differentiation maintained. No other areas of chronic cortical infarction. No acute  or chronic intracranial blood products. No mass lesion, midline shift or mass effect. No hydrocephalus or extra-axial fluid collection. Pituitary gland and suprasellar region within normal limits. Vascular: Hypoplastic right vertebral artery not well seen on this motion degraded exam. Major intracranial vascular flow voids are otherwise grossly maintained. Skull and upper cervical spine: Craniocervical junction within normal limits. Bone marrow signal intensity grossly normal. 1.7 cm well-circumscribed FLAIR hyperintense lesion at the left occipital scalp (series 8, image 16), indeterminate, but stable from prior, possibly a sebaceous cyst. No acute scalp soft tissue abnormality. Sinuses/Orbits: Globes and orbital soft tissues demonstrate no acute finding. Paranasal sinuses are largely clear. No significant mastoid effusion. Other: None. IMPRESSION: 1. No acute intracranial abnormality. 2. Age-related cerebral atrophy with mild chronic small vessel ischemic disease, with a few small remote lacunar infarcts about the thalami and cerebellum. Electronically Signed   By: Rise Mu M.D.   On: 06/20/2022 20:27   MR CERVICAL SPINE WO CONTRAST  Result Date: 06/20/2022 CLINICAL DATA:  Initial evaluation for acute myelopathy. EXAM: MRI CERVICAL SPINE WITHOUT CONTRAST TECHNIQUE: Multiplanar, multisequence MR imaging of the cervical spine was performed. No intravenous contrast was administered. COMPARISON:  None available. FINDINGS: Alignment: Examination degraded by motion artifact. Straightening of the normal cervical lordosis. 2 mm degenerative anterolisthesis of C5 on C6 and C7 on T1, with trace retrolisthesis of C4 on C5. Vertebrae: Mild chronic compression deformities noted at the superior endplates of T2 and T3. Vertebral body height otherwise maintained with no other acute or recent fracture. Bone marrow signal intensity diffusely heterogeneous without worrisome osseous lesion. Discogenic reactive endplate  changes present about the C6-7 interspace. No other abnormal marrow edema. Cord: Patchy signal abnormality seen involving the cervical spinal cord at the levels of C5-6 and C6-7, consistent with chronic myelomalacia. Additional questionable subtle foci of signal abnormality within the upper cord at the level of C3-4 (series 12, image 11). Posterior Fossa, vertebral arteries, paraspinal tissues: Visualized brain and posterior fossa within normal limits. Craniocervical junction normal. Paraspinous soft  tissues within normal limits. Preserved flow void within a strongly dominant left vertebral artery. Right vertebral artery hypoplastic and not well seen. 5 mm right thyroid nodule noted, of doubtful significance given size and patient age, no follow-up imaging recommended (ref: J Am Coll Radiol. 2015 Feb;12(2): 143-50). Disc levels: C2-C3: Negative interspace. Mild left-sided facet hypertrophy. No canal or foraminal stenosis. C3-C4: Mild degenerative vertebral disc space narrowing with diffuse disc osteophyte complex. Flattening and partial effacement of the ventral thecal sac. Mild cord flattening with possible subtle cord signal changes as above. Moderate spinal stenosis with severe bilateral C4 foraminal narrowing. C4-C5: Right eccentric disc bulge with right greater than left uncovertebral spurring. Mild right-sided facet and ligament flavum hypertrophy. No significant spinal stenosis. Moderate left with severe right C5 foraminal stenosis. C5-C6: Left paracentral disc protrusion indents the ventral thecal sac (series 12, image 21). Superimposed endplate and uncovertebral spurring with right-sided facet hypertrophy. Resultant severe spinal stenosis with the thecal sac measuring 5-6 mm in AP diameter. Secondary cord flattening with patchy cord signal changes, likely chronic myelomalacia. Severe right worse than left C6 foraminal stenosis. C6-C7: Degenerative intervertebral disc space narrowing with diffuse disc  osteophyte complex. Superimposed large central disc extrusion with inferior migration (series 12, image 27). Secondary cord flattening with associated cord signal changes, consistent with myelomalacia. Superimposed bilateral facet hypertrophy. Resultant severe spinal stenosis with the thecal sac measuring 2 mm in AP diameter at its most narrow point. Severe bilateral C7 foraminal narrowing. C7-T1: Mild disc bulge with uncovertebral spurring. Mild facet and ligament flavum hypertrophy. Resultant mild-to-moderate spinal stenosis. Moderate left C8 foraminal narrowing. Right neural foramina remains patent. IMPRESSION: 1. Multilevel cervical spondylosis with resultant diffuse spinal stenosis, severe in nature at C5-6 and C6-7. Patchy cord signal changes at these levels consistent with myelomalacia. Neurosurgical/spine surgery consultation recommended. 2. Question additional subtle cord signal abnormality at the level of C3-4, also suspicious for myelomalacia. 3. Multifactorial degenerative changes with resultant multilevel foraminal narrowing as above. Notable findings include severe bilateral C4 foraminal stenosis, moderate left with severe right C5 foraminal narrowing, severe bilateral C6 and C7 foraminal stenosis, with moderate left C8 foraminal narrowing. Electronically Signed   By: Rise Mu M.D.   On: 06/20/2022 20:15   CT HEAD WO CONTRAST  Result Date: 06/20/2022 CLINICAL DATA:  Headaches, numbness in tongue EXAM: CT HEAD WITHOUT CONTRAST TECHNIQUE: Contiguous axial images were obtained from the base of the skull through the vertex without intravenous contrast. RADIATION DOSE REDUCTION: This exam was performed according to the departmental dose-optimization program which includes automated exposure control, adjustment of the mA and/or kV according to patient size and/or use of iterative reconstruction technique. COMPARISON:  08/06/2021 FINDINGS: Brain: No acute intracranial findings are seen in  noncontrast CT brain. There are no signs of bleeding within the cranium. Cortical sulci are prominent. There is decreased density in periventricular white matter. Vascular: Unremarkable. Skull: No acute findings are seen. Sinuses/Orbits: There is mucosal thickening in ethmoid sinus. Other: None IMPRESSION: No acute intracranial findings are seen in noncontrast CT brain. Atrophy. Small vessel disease. Electronically Signed   By: Ernie Avena M.D.   On: 06/20/2022 14:00    Procedures Procedures    Medications Ordered in ED Medications  predniSONE (DELTASONE) tablet 60 mg (has no administration in time range)    ED Course/ Medical Decision Making/ A&P  Medical Decision Making Amount and/or Complexity of Data Reviewed Labs: ordered. Radiology: ordered.   This patient presents to the ED for concern of numbness, this involves an extensive number of treatment options, and is a complaint that carries with it a high risk of complications and morbidity.  The differential diagnosis includes CVA, TIA, seizure, complex migraine, spinal stenosis, radiculopathy   Co morbidities that complicate the patient evaluation  asthma, COPD, DM, GERD, gastroparesis, HLD, HTN, hypothyroidism   Additional history obtained:  Additional history obtained from patient's daughter External records from outside source obtained and reviewed including EMR   Lab Tests:  I Ordered, and personally interpreted labs.  The pertinent results include: Mild hyperglycemia without evidence of DKA, normal electrolytes, normal kidney function, no leukocytosis   Imaging Studies ordered:  I ordered imaging studies including CT head, MRI of brain, MRI of cervical spine I independently visualized and interpreted imaging which showed no acute findings on brain imaging.  MRI of cervical spine showed areas of severe spinal stenosis with patchy cord signal changes at these levels consistent with  myelomalacia I agree with the radiologist interpretation   Cardiac Monitoring: / EKG:  The patient was maintained on a cardiac monitor.  I personally viewed and interpreted the cardiac monitored which showed an underlying rhythm of: Sinus rhythm   Consultations Obtained:  I requested consultation with the neurosurgeon,  and discussed lab and imaging findings as well as pertinent plan - they recommend: Outpatient follow-up, steroids   Problem List / ED Course / Critical interventions / Medication management  Patient presenting for intermittent left-sided numbness over the past week.  Daughter is also noticed intermittent word finding difficulty.  Prior to being bedded in the ED, patient underwent initial diagnostic workup.  Lab work and CT scan of head were unremarkable.  On assessment, patient resting comfortably on ED stretcher.  She has no objective neurologic deficits.  She does endorse diminished sensation to her left hemibody.  MRI of head and cervical spine were ordered.  Patient's lab results were unremarkable.  On MRI of cervical spine, there were multiple areas of severe spinal stenosis with corresponding patchy cord signal changes consistent with myelomalacia.  I suspect that this is the cause of her recent intermittent symptoms.  I discussed this with neurosurgery who recommends outpatient follow-up.  Patient was prescribed steroids.  She was discharged in stable condition. I ordered medication including prednisone for stenosis Reevaluation of the patient after these medicines showed that the patient stayed the same I have reviewed the patients home medicines and have made adjustments as needed   Social Determinants of Health:  Lives at home with husband        Final Clinical Impression(s) / ED Diagnoses Final diagnoses:  Spinal stenosis of cervical region  Myelomalacia  Left sided numbness    Rx / DC Orders ED Discharge Orders          Ordered    predniSONE  (STERAPRED UNI-PAK 21 TAB) 10 MG (21) TBPK tablet  Daily        06/20/22 2236              Gloris Manchester, MD 06/20/22 2238

## 2022-06-20 NOTE — ED Notes (Signed)
Pt passed stroke swallow screen.  

## 2022-06-23 DIAGNOSIS — J069 Acute upper respiratory infection, unspecified: Secondary | ICD-10-CM | POA: Diagnosis not present

## 2022-06-23 DIAGNOSIS — M4802 Spinal stenosis, cervical region: Secondary | ICD-10-CM | POA: Diagnosis not present

## 2022-06-23 DIAGNOSIS — G992 Myelopathy in diseases classified elsewhere: Secondary | ICD-10-CM | POA: Diagnosis not present

## 2022-07-06 ENCOUNTER — Encounter: Payer: Self-pay | Admitting: Gastroenterology

## 2022-07-07 ENCOUNTER — Other Ambulatory Visit: Payer: Self-pay | Admitting: Neurosurgery

## 2022-07-13 NOTE — Pre-Procedure Instructions (Signed)
Surgical Instructions    Your procedure is scheduled on Tuesday, May 7th.  Report to [facility] Main Entrance "A" at 06:00 A.M., then check in with the Admitting office.  Call this number if you have problems the morning of surgery:  [phone]  If you have any questions prior to your surgery date call [phone]: Open Monday-Friday 8am-4pm If you experience any cold or flu symptoms such as cough, fever, chills, shortness of breath, etc. between now and your scheduled surgery, please notify us at the above number.     Remember:  Do not eat or drink after midnight the night before your surgery     Take these medicines the morning of surgery with A SIP OF WATER  albuterol (PROVENTIL)  cetirizine (ZYRTEC)  esomeprazole (NEXIUM)  levothyroxine (SYNTHROID)   If needed: albuterol (VENTOLIN HFA)- bring inhaler with you on day of surgery meclizine (ANTIVERT)    Follow your surgeon's instructions on when to stop Aspirin.  If no instructions were given by your surgeon then you will need to call the office to get those instructions. This includes meloxicam (MOBIC).  As of today, STOP taking any Aleve, Naproxen, Ibuprofen, Motrin, Advil, Goody's, BC's, all herbal medications, fish oil, and all vitamins.                     Do NOT Smoke (Tobacco/Vaping) for 24 hours prior to your procedure.  If you use a CPAP at night, you may bring your mask/headgear for your overnight stay.   Contacts, glasses, piercing's, hearing aid's, dentures or partials may not be worn into surgery, please bring cases for these belongings.    For patients admitted to the hospital, discharge time will be determined by your treatment team.   Patients discharged the day of surgery will not be allowed to drive home, and someone needs to stay with them for 24 hours.  SURGICAL WAITING ROOM VISITATION Patients having surgery or a procedure may have no more than 2 support people in the waiting area - these visitors  may rotate.   Children under the age of 43 must have an adult with them who is not the patient. If the patient needs to stay at the hospital during part of their recovery, the visitor guidelines for inpatient rooms apply. Pre-op nurse will coordinate an appropriate time for 1 support person to accompany patient in pre-op.  This support person may not rotate.   Please refer to the [facility] website for the visitor guidelines for Inpatients (after your surgery is over and you are in a regular room).      Day of Surgery: Take a shower with CHG soap. Do not wear jewelry or makeup Do not wear lotions, powders, perfumes/colognes, or deodorant. Do not bring valuables to the hospital. [facility] is not responsible for any belongings or valuables. Do not wear nail polish, gel polish, artificial nails, or any other type of covering on natural nails (fingers and toes) If you have artificial nails or gel coating that need to be removed by a nail salon, please have this removed prior to surgery. Artificial nails or gel coating may interfere with anesthesia's ability to adequately monitor your vital signs. Wear Clean/Comfortable clothing the morning of surgery Remember to brush your teeth WITH YOUR REGULAR TOOTHPASTE.   Please read over the following fact sheets that you were given.    If you received a COVID test during your pre-op visit  it is requested that you wear a  mask when out in public, stay away from anyone that may not be feeling well and notify your surgeon if you develop symptoms. If you have been in contact with anyone that has tested positive in the last 10 days please notify you surgeon.

## 2022-07-14 ENCOUNTER — Emergency Department (HOSPITAL_COMMUNITY)
Admission: EM | Admit: 2022-07-14 | Discharge: 2022-07-14 | Disposition: A | Discharge status: Home / Self Care | Payer: 59 | Attending: Emergency Medicine | Admitting: Emergency Medicine

## 2022-07-14 ENCOUNTER — Encounter (HOSPITAL_COMMUNITY)
Admission: RE | Admit: 2022-07-14 | Discharge: 2022-07-14 | Disposition: A | Discharge status: Home / Self Care | Payer: 59 | Source: Ambulatory Visit | Attending: Neurosurgery | Admitting: Neurosurgery

## 2022-07-14 ENCOUNTER — Encounter (HOSPITAL_COMMUNITY): Payer: Self-pay

## 2022-07-14 ENCOUNTER — Other Ambulatory Visit: Payer: Self-pay

## 2022-07-14 VITALS — BP 87/75 | HR 81 | Temp 97.9°F | Resp 18 | Ht 64.0 in | Wt 123.0 lb

## 2022-07-14 DIAGNOSIS — Z7982 Long term (current) use of aspirin: Secondary | ICD-10-CM | POA: Diagnosis not present

## 2022-07-14 DIAGNOSIS — H547 Unspecified visual loss: Secondary | ICD-10-CM | POA: Diagnosis not present

## 2022-07-14 DIAGNOSIS — H5462 Unqualified visual loss, left eye, normal vision right eye: Secondary | ICD-10-CM

## 2022-07-14 DIAGNOSIS — E119 Type 2 diabetes mellitus without complications: Secondary | ICD-10-CM

## 2022-07-14 DIAGNOSIS — I10 Essential (primary) hypertension: Secondary | ICD-10-CM | POA: Insufficient documentation

## 2022-07-14 DIAGNOSIS — Z01812 Encounter for preprocedural laboratory examination: Secondary | ICD-10-CM | POA: Insufficient documentation

## 2022-07-14 DIAGNOSIS — I739 Peripheral vascular disease, unspecified: Secondary | ICD-10-CM | POA: Insufficient documentation

## 2022-07-14 DIAGNOSIS — Z01818 Encounter for other preprocedural examination: Secondary | ICD-10-CM

## 2022-07-14 DIAGNOSIS — Z87891 Personal history of nicotine dependence: Secondary | ICD-10-CM | POA: Insufficient documentation

## 2022-07-14 DIAGNOSIS — J449 Chronic obstructive pulmonary disease, unspecified: Secondary | ICD-10-CM | POA: Insufficient documentation

## 2022-07-14 DIAGNOSIS — Z794 Long term (current) use of insulin: Secondary | ICD-10-CM | POA: Insufficient documentation

## 2022-07-14 DIAGNOSIS — J45909 Unspecified asthma, uncomplicated: Secondary | ICD-10-CM | POA: Insufficient documentation

## 2022-07-14 HISTORY — DX: Depression, unspecified: F32.A

## 2022-07-14 LAB — CBC
HCT: 40 % (ref 36.0–46.0)
Hemoglobin: 13.9 g/dL (ref 12.0–15.0)
MCH: 33.3 pg (ref 26.0–34.0)
MCHC: 34.8 g/dL (ref 30.0–36.0)
MCV: 95.7 fL (ref 80.0–100.0)
Platelets: 104 10*3/uL — ABNORMAL LOW (ref 150–400)
RBC: 4.18 MIL/uL (ref 3.87–5.11)
RDW: 12 % (ref 11.5–15.5)
WBC: 10 10*3/uL (ref 4.0–10.5)
nRBC: 0 % (ref 0.0–0.2)

## 2022-07-14 LAB — BASIC METABOLIC PANEL
Anion gap: 11 (ref 5–15)
BUN: 24 mg/dL — ABNORMAL HIGH (ref 6–20)
CO2: 22 mmol/L (ref 22–32)
Calcium: 9.6 mg/dL (ref 8.9–10.3)
Chloride: 101 mmol/L (ref 98–111)
Creatinine, Ser: 1.21 mg/dL — ABNORMAL HIGH (ref 0.44–1.00)
GFR, Estimated: 52 mL/min — ABNORMAL LOW (ref >60)
Glucose, Bld: 296 mg/dL — ABNORMAL HIGH (ref 70–99)
Potassium: 4 mmol/L (ref 3.5–5.1)
Sodium: 134 mmol/L — ABNORMAL LOW (ref 135–145)

## 2022-07-14 LAB — TYPE AND SCREEN
ABO/RH(D): A POS
Antibody Screen: NEGATIVE

## 2022-07-14 LAB — HEMOGLOBIN A1C
Hemoglobin A1C: 9.3
Hgb A1c MFr Bld: 9.3 % — ABNORMAL HIGH (ref 4.8–5.6)
Mean Plasma Glucose: 220.21 mg/dL

## 2022-07-14 LAB — SURGICAL PCR SCREEN
MRSA, PCR: NEGATIVE
Staphylococcus aureus: NEGATIVE

## 2022-07-14 LAB — GLUCOSE, CAPILLARY: Glucose-Capillary: 289 mg/dL — ABNORMAL HIGH (ref 70–99)

## 2022-07-14 MED ORDER — TROPICAMIDE 1 % OP SOLN
2.0000 [drp] | Freq: Once | OPHTHALMIC | Status: AC
  Administered 2022-07-14: 2 [drp] via OPHTHALMIC
  Filled 2022-07-14: qty 15

## 2022-07-14 MED ORDER — TETRACAINE HCL 0.5 % OP SOLN
2.0000 [drp] | Freq: Once | OPHTHALMIC | Status: AC
  Administered 2022-07-14: 2 [drp] via OPHTHALMIC
  Filled 2022-07-14: qty 4

## 2022-07-14 NOTE — ED Provider Notes (Signed)
[place] EMERGENCY DEPARTMENT AT [facility] Provider Note   CSN: [id number] Arrival date & time: 07/14/22  1040     History  Chief Complaint  Patient presents with   Hypotension   Eye Problem   Loss of Vision    Alexandria Webster is a 57 y.o. female.  HPI Preop evaluation for a planned cervical fusion.  When she was being evaluated she did report that she had left visual dysfunction and was seeing "red and black" in the vision.  York Spaniel this was actually a week ago.  She has noticed symptoms worsening may be over the past day or 2.  Also, reportedly the patient's blood pressure was somewhat low during evaluation for preop.  She was advised to come to the emergency department for further evaluation.  Denies any other focal weakness numbness or tingling of extremities.  She has not been having a severe headache.  She has some pre-existing problem secondary to her cervical spine dysfunction but does not note any acute loss of sensation or acute extremity dysfunction.    Home Medications Prior to Admission medications   Medication Sig Start Date End Date Taking? Authorizing Provider  albuterol (PROVENTIL) 2 MG tablet Take 2 mg by mouth daily.    [provider]  albuterol (VENTOLIN HFA) 108 (90 Base) MCG/ACT inhaler Inhale 1-2 puffs into the lungs every 4 (four) hours as needed for wheezing or shortness of breath. 09/01/21   [provider]  aspirin 325 MG tablet Take 325 mg by mouth daily.    [provider]  cetirizine (ZYRTEC) 10 MG tablet Take 10 mg by mouth daily. 07/23/21   [provider]  docusate sodium (COLACE) 100 MG capsule Take 100 mg by mouth daily.    [provider]  Dulaglutide (TRULICITY) 1.5 MG/0.5ML SOPN Inject 1.5 mg into the skin once a week. 07/25/22   Angiulli, Mcarthur Rossetti, PA-C  esomeprazole (NEXIUM) 40 MG capsule Take 1 capsule (40 mg total) by mouth 2 (two) times daily before a meal. Patient taking differently:  Take 40 mg by mouth daily. 01/20/22   Aida Raider, NP  furosemide (LASIX) 20 MG tablet Take 1 tablet (20 mg total) by mouth every other day. Patient taking differently: Take 20 mg by mouth daily. 08/12/21   Johnson, Clanford L, MD  glimepiride (AMARYL) 1 MG tablet Take 1 tablet (1 mg total) by mouth every morning. 07/25/22 07/25/23  Angiulli, Mcarthur Rossetti, PA-C  glucose blood (ACCU-CHEK AVIVA) test strip Test glucose 4 times a day 09/28/15   Roma Kayser, MD  Insulin Pen Needle (B-D UF III MINI PEN NEEDLES) 31G X 5 MM MISC Use qhs 09/17/15   Roma Kayser, MD  levothyroxine (SYNTHROID) 50 MCG tablet Take 50 mcg by mouth daily before breakfast.    [provider]  lisinopril (ZESTRIL) 5 MG tablet Take 5 mg by mouth daily. 07/23/21   [provider]  lovastatin (MEVACOR) 20 MG tablet Take 20 mg by mouth at bedtime.    [provider]  meclizine (ANTIVERT) 25 MG tablet Take 25 mg by mouth 3 (three) times daily as needed for dizziness. 01/12/22   [provider]  meloxicam (MOBIC) 7.5 MG tablet Take 1 tablet (7.5 mg total) by mouth daily as needed for pain. Patient taking differently: Take 7.5 mg by mouth 2 (two) times daily as needed for pain. 08/08/21   Johnson, Clanford L, MD  polyethylene glycol powder (MIRALAX) 17 GM/SCOOP powder  Take 17 g by mouth daily as needed for moderate constipation.    [provider]  predniSONE (STERAPRED UNI-PAK 21 TAB) 10 MG (21) TBPK tablet Take by mouth daily. Take 6 tabs by mouth daily  for 2 days, then 5 tabs for 2 days, then 4 tabs for 2 days, then 3 tabs for 2 days, 2 tabs for 2 days, then 1 tab by mouth daily for 2 days Patient not taking: Reported on 07/12/2022 06/20/22   Gloris Manchester, MD  traZODone (DESYREL) 150 MG tablet Take 150 mg by mouth at bedtime. 07/29/21   [provider]      Allergies    Metformin and related    Review of Systems   Review of Systems  Physical Exam Updated Vital  Signs BP 121/73   Pulse 65   Temp 98.2 F (36.8 C) (Oral)   Resp 15   Ht 5\' 4"  (1.626 m)   Wt 55.7 kg   LMP 01/21/2012   SpO2 100%   BMI 21.08 kg/m  Physical Exam Constitutional:      Comments: Alert nontoxic no acute distress.  HENT:     Head: Normocephalic and atraumatic.  Eyes:     Comments: Right intraocular pressure 16 Left intraocular pressure 10  Ocular motions are intact.  No periorbital swelling or edema.  Dilated examination of the left eye shows billowy cloudlike dark maroon areas and darker patches on the retina.  Cannot make out any clear optic disc.  However, does not appear to be dark curtain like waves to suggest any large retinal detachment  Cardiovascular:     Rate and Rhythm: Normal rate and regular rhythm.  Pulmonary:     Effort: Pulmonary effort is normal.     Breath sounds: Normal breath sounds.  Abdominal:     Palpations: Abdomen is soft.  Musculoskeletal:        General: Normal range of motion.  Skin:    General: Skin is warm and dry.  Neurological:     General: No focal deficit present.     Mental Status: She is oriented to person, place, and time.     Motor: No weakness.  Psychiatric:        Mood and Affect: Mood normal.     ED Results / Procedures / Treatments   Labs (all labs ordered are listed, but only abnormal results are displayed) Labs Reviewed - No data to display  EKG EKG Interpretation  Date/Time:  Thursday Jul 14 2022 10:52:25 EDT Ventricular Rate:  71 PR Interval:  167 QRS Duration: 93 QT Interval:  393 QTC Calculation: 428 R Axis:   44 Text Interpretation: Sinus rhythm Multiple ventricular premature complexes Low voltage, extremity and precordial leads since last tracing no significant change Confirmed by Rolan Bucco [phone]) on 07/15/2022 9:41:25 AM  Radiology DG CHEST PORT 1 VIEW  Result Date: 07/25/2022 CLINICAL DATA:  Delirium. EXAM: PORTABLE CHEST 1 VIEW COMPARISON:  X-ray 10/12/2018 and older FINDINGS: No  consolidation, pneumothorax or effusion. Slight linear opacity of the left lung base likely scar or atelectasis. Normal cardiopericardial silhouette. Calcified aorta. Degenerative changes of the spine. Osteopenia. Fixation hardware seen along the cervical spine at the edge of the imaging field. IMPRESSION: Minimal left basilar scar or atelectasis. No consolidation or effusion. Electronically Signed   By: Karen Kays M.D.   On: 07/25/2022 15:40    Procedures Procedures    Medications Ordered in ED Medications  tetracaine (PONTOCAINE) 0.5 % ophthalmic solution 2  drop (2 drops Both Eyes Given 07/14/22 1253)  tropicamide (MYDRIACYL) 1 % ophthalmic solution 2 drop (2 drops Both Eyes Given 07/14/22 1307)    ED Course/ Medical Decision Making/ A&P                             Medical Decision Making Risk Prescription drug management.   Patient presents as outlined.  Main concern is a visual change with poor documentation of onset.  Patient is not endorsing other strokelike symptoms.  She has some chronic symptoms secondary to her cervical spine stenosis.  There is chronicity of upper extremity paresthesias and weakness and some gait instability.  On review of systems none of these seem to be any acute changes relative to the patient's visual complaint that was identified during preop evaluation.  At this time have low suspicion for acute stroke as the etiology for the patient's loss.    On examination, there are intraocular abnormalities that suggest possible vitreous hemorrhage or other intraocular process.  I have lower suspicion for any large retinal detachment based on the exam however there may be partial detachments.  At this point however this appears to be a subacute process.  Consult: Reviewed with Dr. Randon Goldsmith ophthalmology.  We reviewed the patient's history and he will have the patient in the office for detailed exam however she does not have to be sent emergently today.  There was concern  and preop evaluation for hypotension.  Patient has been normotensive.  At this time I do not find findings for hypotension.  Patient is not septic or toxic in appearance.  GFR is in range of baseline.  She is not clinically dehydrated.  Possibly blood pressure medication effect from earlier this morning.  After completing evaluation, I feel patient is stable for discharge.  I have reviewed the plan and results with the patient's daughter as well.  They are in agreement and will follow-up with ophthalmology and return if any sudden worsening or changing symptoms.        Final Clinical Impression(s) / ED Diagnoses Final diagnoses:  Vision loss of left eye    Rx / DC Orders ED Discharge Orders     None         Arby Barrette, MD 07/25/22 1831

## 2022-07-14 NOTE — ED Notes (Signed)
Visual acuity   R eye 20/32.  L eye 0-completely black.  Both 20/32

## 2022-07-14 NOTE — Progress Notes (Addendum)
Anesthesia Chart Review:  57 yo female with pertinent hx including uncontrolled IDDM 2 (A1c 9.3 on 07/14/2022), former smoker with associated COPD, labile hypertension, asthma, PAD s/p remote aortobifem bypass with redo by Dr. Chestine Spore in 2020.  Recently seen in the ED on 06/20/2022 with symptoms concerning for possible TIA.  Symptoms were felt not related to TIA/CVA, rather, they were felt to be the result of severe multilevel cervical spine stenosis and she was referred to neurosurgery who ultimately recommended multilevel decompression/fusion.  In preadmission testing clinic today patient reports that she has had painful vision loss of her left eye for approximately 1 week.  She reports near complete vision loss in the left eye and says she can only make out shapes and can only see "black and red". Says she has been having some vision changes for the past month, but acutely worsened in the past week.  States vision in right eye is unchanged.  She states that her recent vision loss has not been evaluated by any other provider.  She was advised that this was an emergency and she should be evaluated in the ED.  Patient and patient daughter verbalized understanding and patient was transported to emergency department for further workup. Patient also noted to be hypotensive with multiple providers having difficulty obtaining a manual blood pressure.   ADDENDUM 07/18/22: Patient was subsequently seen at the ED and ophthalmology was consulted.  She was discharged and instructed to follow-up outpatient with ophthalmology on 07/15/2022.  She did ultimately see Dr. Randon Goldsmith and I requested and reviewed the office visit note.  Per note, patient is unsure whether or not she ever had vision in her left eye.  Patient was recommended follow-up with retina specialist.  I called and spoke with patient's daughter Ching Lawrenz on 07/18/2022 she reports that Dr. Randon Goldsmith did not express any concern with patient proceeding with spine  surgery.  Patient is scheduled to see a retina specialist on 08/02/2022 to discuss if there is any intervention necessary for her vision loss.  I reviewed the above history with anesthesiologist Dr. Hyacinth Meeker.  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  She will need day of surgery evaluation by assigned anesthesiologist.  Consider clear sight or A-line given difficulty obtaining manual blood pressure.  Zannie Cove [facility] Short Stay Center/Anesthesiology Phone [phone] 07/14/2022 10:29 AM

## 2022-07-14 NOTE — Progress Notes (Signed)
PCP - Dr. Assunta Found Cardiologist - denies  PPM/ICD - denies   Chest x-ray - 10/12/18 EKG - 06/20/22 Stress Test - 20+ years ago per pt, normal per pt ECHO - 08/06/21 Cardiac Cath - denies  Sleep Study - denies   Fasting Blood Sugar - 90-100 Checks Blood Sugar once a day  Last dose of GLP1 agonist-  n/a  Pt states her PCP stopped her diabetic medications 2 months ago. She was advised to f/u with him about her CBG of 289 today to see if he would like to resume medications  Blood Thinner Instructions: n/a Aspirin Instructions: Hold 1 week. Last dose 4/28  ERAS Protcol - no, NPO   COVID TEST- n/a   Anesthesia review: yes, pt having to go to ED. Fayrene Fearing evaluated her and was advised to send her to the ED due to acute vision loss. Also, unable to obtain a BP on pt   Patient denies shortness of breath, fever, cough and chest pain at PAT appointment   All instructions explained to the patient, with a verbal understanding of the material. Patient agrees to go over the instructions while at home for a better understanding. The opportunity to ask questions was provided.

## 2022-07-14 NOTE — ED Notes (Signed)
Pt states she was upstairs for pre-op. For a week her L eye has been seeing black and red spots. Tried to see her MD, but couldn't get an appt. Denies any pain. Denies HA, CP, or SOB. Ambulates with a safe, steady gait. Pt reports she has occasional numbness in her lower extremities.

## 2022-07-14 NOTE — ED Notes (Signed)
Pt states she ambulates with a walker at home. Used a bedpan to void.

## 2022-07-14 NOTE — Discharge Instructions (Signed)
1.  Try to sleep with your head elevated to 30 degrees. 2.  Go to the ophthalmologist office tomorrow morning at 8 AM.  You will be seen for more detailed examination.  Go to [facility] ophthalmology to see Dr. Durward Mallard information and address is included in your discharge instructions.

## 2022-07-14 NOTE — ED Triage Notes (Addendum)
Pt. Was brought down from upstairs in pre op "having a fusion in my neck" due to low BP and pt stated, Im seeing red and black in my left eye and if I close my rt. Eye I can't see anything in my left. This started a week and half ago.

## 2022-07-15 DIAGNOSIS — E113553 Type 2 diabetes mellitus with stable proliferative diabetic retinopathy, bilateral: Secondary | ICD-10-CM | POA: Diagnosis not present

## 2022-07-18 DIAGNOSIS — Z6824 Body mass index (BMI) 24.0-24.9, adult: Secondary | ICD-10-CM | POA: Diagnosis not present

## 2022-07-18 DIAGNOSIS — E1100 Type 2 diabetes mellitus with hyperosmolarity without nonketotic hyperglycemic-hyperosmolar coma (NKHHC): Secondary | ICD-10-CM | POA: Diagnosis not present

## 2022-07-18 NOTE — Anesthesia Preprocedure Evaluation (Addendum)
Anesthesia Evaluation  Patient identified by MRN, date of birth, ID band Patient awake    Reviewed: Allergy & Precautions, NPO status , Patient's Chart, lab work & pertinent test results  History of Anesthesia Complications (+) PONV  Airway Mallampati: I  TM Distance: >3 FB Neck ROM: Full    Dental  (+) Edentulous Upper, Missing, Dental Advisory Given   Pulmonary asthma , COPD,  COPD inhaler, former smoker   breath sounds clear to auscultation       Cardiovascular hypertension, Pt. on medications (-) angina + Peripheral Vascular Disease   Rhythm:Regular Rate:Normal  '23 ECHO: EF 65-70%, normal LVF, Grade 1 DD, normal RVF, no significant valvular abnormalities   Neuro/Psych    Depression    Vision deficit     GI/Hepatic Neg liver ROS,GERD  Medicated and Controlled,,  Endo/Other  diabetes (glu 147), Insulin DependentHypothyroidism    Renal/GU Renal InsufficiencyRenal disease     Musculoskeletal   Abdominal   Peds  Hematology Hb 13.9, plt 104k   Anesthesia Other Findings   Reproductive/Obstetrics                             Anesthesia Physical Anesthesia Plan  ASA: 3  Anesthesia Plan: General   Post-op Pain Management: Tylenol PO (pre-op)*   Induction: Intravenous  PONV Risk Score and Plan: 4 or greater and Ondansetron, Dexamethasone, TIVA and Scopolamine patch - Pre-op  Airway Management Planned: Oral ETT and Video Laryngoscope Planned  Additional Equipment: None  Intra-op Plan:   Post-operative Plan: Extubation in OR  Informed Consent: I have reviewed the patients History and Physical, chart, labs and discussed the procedure including the risks, benefits and alternatives for the proposed anesthesia with the patient or authorized representative who has indicated his/her understanding and acceptance.     Dental advisory given  Plan Discussed with: CRNA and  Surgeon  Anesthesia Plan Comments: (See PAT note by Antionette Poles, PA-C  )        Anesthesia Quick Evaluation

## 2022-07-18 NOTE — Progress Notes (Signed)
Patient's daughter,Alexandria Webster,called to ask if patient should start taking Trulicity prior to surgery which is scheduled for tomorrow. I told pt's daughter that Trulicity should be held seven days prior to scheduled procedure/anesthesia. Apparently Trulicity was ordered by pt's PCP after pt's PAT appointment . Today pt reports her blood sugar was 139 after she took Amaryl which her PCP had ordered. It is not on her med list. Advised patient and her daughter that she should not take Amaryl the morning of sugar. Also instructed them that patient should check her blood sugar four times today and in the morning when she gets up and every two hours until she comes to the hospital. They verbalized understanding.

## 2022-07-19 ENCOUNTER — Inpatient Hospital Stay (HOSPITAL_COMMUNITY)
Admission: RE | Admit: 2022-07-19 | Discharge: 2022-07-22 | DRG: 472 | Disposition: A | Discharge status: Rehabilitation Facility | Payer: 59 | Attending: Neurosurgery | Admitting: Neurosurgery

## 2022-07-19 ENCOUNTER — Other Ambulatory Visit: Payer: Self-pay

## 2022-07-19 ENCOUNTER — Inpatient Hospital Stay (HOSPITAL_COMMUNITY): Payer: 59 | Admitting: Physician Assistant

## 2022-07-19 ENCOUNTER — Inpatient Hospital Stay (HOSPITAL_COMMUNITY): Payer: 59 | Admitting: Anesthesiology

## 2022-07-19 ENCOUNTER — Encounter (HOSPITAL_COMMUNITY)
Admission: RE | Disposition: A | Discharge status: Rehabilitation Facility | Payer: Self-pay | Source: Home / Self Care | Attending: Neurosurgery

## 2022-07-19 ENCOUNTER — Inpatient Hospital Stay (HOSPITAL_COMMUNITY): Payer: 59

## 2022-07-19 DIAGNOSIS — M47812 Spondylosis without myelopathy or radiculopathy, cervical region: Secondary | ICD-10-CM | POA: Diagnosis not present

## 2022-07-19 DIAGNOSIS — M4802 Spinal stenosis, cervical region: Principal | ICD-10-CM | POA: Diagnosis present

## 2022-07-19 DIAGNOSIS — G992 Myelopathy in diseases classified elsewhere: Secondary | ICD-10-CM | POA: Diagnosis not present

## 2022-07-19 DIAGNOSIS — E1151 Type 2 diabetes mellitus with diabetic peripheral angiopathy without gangrene: Secondary | ICD-10-CM | POA: Diagnosis present

## 2022-07-19 DIAGNOSIS — I10 Essential (primary) hypertension: Secondary | ICD-10-CM

## 2022-07-19 DIAGNOSIS — E1143 Type 2 diabetes mellitus with diabetic autonomic (poly)neuropathy: Secondary | ICD-10-CM | POA: Diagnosis not present

## 2022-07-19 DIAGNOSIS — G3184 Mild cognitive impairment, so stated: Secondary | ICD-10-CM | POA: Diagnosis not present

## 2022-07-19 DIAGNOSIS — Z79899 Other long term (current) drug therapy: Secondary | ICD-10-CM | POA: Diagnosis not present

## 2022-07-19 DIAGNOSIS — Z888 Allergy status to other drugs, medicaments and biological substances status: Secondary | ICD-10-CM | POA: Diagnosis not present

## 2022-07-19 DIAGNOSIS — G4700 Insomnia, unspecified: Secondary | ICD-10-CM | POA: Diagnosis not present

## 2022-07-19 DIAGNOSIS — Z794 Long term (current) use of insulin: Secondary | ICD-10-CM | POA: Diagnosis not present

## 2022-07-19 DIAGNOSIS — J45909 Unspecified asthma, uncomplicated: Secondary | ICD-10-CM | POA: Diagnosis not present

## 2022-07-19 DIAGNOSIS — S12400A Unspecified displaced fracture of fifth cervical vertebra, initial encounter for closed fracture: Secondary | ICD-10-CM

## 2022-07-19 DIAGNOSIS — I951 Orthostatic hypotension: Secondary | ICD-10-CM | POA: Diagnosis not present

## 2022-07-19 DIAGNOSIS — E7800 Pure hypercholesterolemia, unspecified: Secondary | ICD-10-CM | POA: Diagnosis not present

## 2022-07-19 DIAGNOSIS — I9581 Postprocedural hypotension: Secondary | ICD-10-CM | POA: Diagnosis not present

## 2022-07-19 DIAGNOSIS — Z825 Family history of asthma and other chronic lower respiratory diseases: Secondary | ICD-10-CM | POA: Diagnosis not present

## 2022-07-19 DIAGNOSIS — F32A Depression, unspecified: Secondary | ICD-10-CM | POA: Diagnosis not present

## 2022-07-19 DIAGNOSIS — M4712 Other spondylosis with myelopathy, cervical region: Secondary | ICD-10-CM | POA: Diagnosis not present

## 2022-07-19 DIAGNOSIS — Z7982 Long term (current) use of aspirin: Secondary | ICD-10-CM

## 2022-07-19 DIAGNOSIS — R531 Weakness: Secondary | ICD-10-CM | POA: Diagnosis present

## 2022-07-19 DIAGNOSIS — K219 Gastro-esophageal reflux disease without esophagitis: Secondary | ICD-10-CM | POA: Diagnosis present

## 2022-07-19 DIAGNOSIS — M542 Cervicalgia: Secondary | ICD-10-CM | POA: Diagnosis not present

## 2022-07-19 DIAGNOSIS — G959 Disease of spinal cord, unspecified: Secondary | ICD-10-CM

## 2022-07-19 DIAGNOSIS — Z79890 Hormone replacement therapy (postmenopausal): Secondary | ICD-10-CM | POA: Diagnosis not present

## 2022-07-19 DIAGNOSIS — M4804 Spinal stenosis, thoracic region: Secondary | ICD-10-CM | POA: Diagnosis present

## 2022-07-19 DIAGNOSIS — R296 Repeated falls: Secondary | ICD-10-CM | POA: Diagnosis not present

## 2022-07-19 DIAGNOSIS — E119 Type 2 diabetes mellitus without complications: Secondary | ICD-10-CM | POA: Diagnosis not present

## 2022-07-19 DIAGNOSIS — J4489 Other specified chronic obstructive pulmonary disease: Secondary | ICD-10-CM | POA: Diagnosis not present

## 2022-07-19 DIAGNOSIS — Z823 Family history of stroke: Secondary | ICD-10-CM | POA: Diagnosis not present

## 2022-07-19 DIAGNOSIS — K5900 Constipation, unspecified: Secondary | ICD-10-CM | POA: Diagnosis not present

## 2022-07-19 DIAGNOSIS — Z833 Family history of diabetes mellitus: Secondary | ICD-10-CM

## 2022-07-19 DIAGNOSIS — I959 Hypotension, unspecified: Secondary | ICD-10-CM | POA: Diagnosis not present

## 2022-07-19 DIAGNOSIS — Z8249 Family history of ischemic heart disease and other diseases of the circulatory system: Secondary | ICD-10-CM

## 2022-07-19 DIAGNOSIS — Z87891 Personal history of nicotine dependence: Secondary | ICD-10-CM

## 2022-07-19 DIAGNOSIS — Z981 Arthrodesis status: Secondary | ICD-10-CM | POA: Diagnosis not present

## 2022-07-19 DIAGNOSIS — E039 Hypothyroidism, unspecified: Secondary | ICD-10-CM | POA: Diagnosis not present

## 2022-07-19 DIAGNOSIS — F17211 Nicotine dependence, cigarettes, in remission: Secondary | ICD-10-CM | POA: Diagnosis not present

## 2022-07-19 DIAGNOSIS — Z720 Tobacco use: Secondary | ICD-10-CM | POA: Diagnosis not present

## 2022-07-19 DIAGNOSIS — R5381 Other malaise: Secondary | ICD-10-CM | POA: Diagnosis not present

## 2022-07-19 DIAGNOSIS — K30 Functional dyspepsia: Secondary | ICD-10-CM | POA: Diagnosis not present

## 2022-07-19 DIAGNOSIS — G9589 Other specified diseases of spinal cord: Secondary | ICD-10-CM | POA: Diagnosis not present

## 2022-07-19 DIAGNOSIS — E1165 Type 2 diabetes mellitus with hyperglycemia: Secondary | ICD-10-CM | POA: Diagnosis not present

## 2022-07-19 DIAGNOSIS — J449 Chronic obstructive pulmonary disease, unspecified: Secondary | ICD-10-CM | POA: Diagnosis not present

## 2022-07-19 DIAGNOSIS — E1142 Type 2 diabetes mellitus with diabetic polyneuropathy: Secondary | ICD-10-CM | POA: Diagnosis not present

## 2022-07-19 DIAGNOSIS — M50022 Cervical disc disorder at C5-C6 level with myelopathy: Secondary | ICD-10-CM | POA: Diagnosis not present

## 2022-07-19 HISTORY — PX: POSTERIOR CERVICAL FUSION/FORAMINOTOMY: SHX5038

## 2022-07-19 LAB — GLUCOSE, CAPILLARY
Glucose-Capillary: 147 mg/dL — ABNORMAL HIGH (ref 70–99)
Glucose-Capillary: 165 mg/dL — ABNORMAL HIGH (ref 70–99)
Glucose-Capillary: 129 mg/dL — ABNORMAL HIGH (ref 70–99)
Glucose-Capillary: 137 mg/dL — ABNORMAL HIGH (ref 70–99)
Glucose-Capillary: 276 mg/dL — ABNORMAL HIGH (ref 70–99)
Glucose-Capillary: 261 mg/dL — ABNORMAL HIGH (ref 70–99)

## 2022-07-19 SURGERY — POSTERIOR CERVICAL FUSION/FORAMINOTOMY LEVEL 3
Anesthesia: General | Site: Spine Cervical

## 2022-07-19 MED ORDER — OXYCODONE HCL 5 MG/5ML PO SOLN: 5.0000 mg | Freq: Once | ORAL | Status: DC | PRN

## 2022-07-19 MED ORDER — CEFAZOLIN SODIUM-DEXTROSE 1-4 GM/50ML-% IV SOLN
1.0000 g | Freq: Three times a day (TID) | INTRAVENOUS | Status: AC
  Administered 2022-07-19 – 2022-07-20 (×2): 1 g via INTRAVENOUS
  Filled 2022-07-19 (×2): qty 50

## 2022-07-19 MED ORDER — POLYETHYLENE GLYCOL 3350 17 GM/SCOOP PO POWD: 17.0000 g | Freq: Every day | ORAL | Status: DC | PRN

## 2022-07-19 MED ORDER — CHLORHEXIDINE GLUCONATE CLOTH 2 % EX PADS: 6.0000 | MEDICATED_PAD | Freq: Once | CUTANEOUS | Status: DC

## 2022-07-19 MED ORDER — LACTATED RINGERS IV SOLN: INTRAVENOUS | Status: DC

## 2022-07-19 MED ORDER — CEFAZOLIN SODIUM-DEXTROSE 2-4 GM/100ML-% IV SOLN
2.0000 g | INTRAVENOUS | Status: AC
  Administered 2022-07-19: 2 g via INTRAVENOUS
  Filled 2022-07-19: qty 100

## 2022-07-19 MED ORDER — THROMBIN 20000 UNITS EX SOLR
CUTANEOUS | Status: DC | PRN
  Administered 2022-07-19: 20 mL via TOPICAL

## 2022-07-19 MED ORDER — HYDROCODONE-ACETAMINOPHEN 10-325 MG PO TABS
1.0000 | ORAL_TABLET | ORAL | Status: DC | PRN
  Administered 2022-07-19 – 2022-07-20 (×2): 1 via ORAL
  Filled 2022-07-19 (×2): qty 1

## 2022-07-19 MED ORDER — ONDANSETRON HCL 4 MG/2ML IJ SOLN: 4.0000 mg | Freq: Four times a day (QID) | INTRAMUSCULAR | Status: DC | PRN

## 2022-07-19 MED ORDER — HYDROMORPHONE HCL 1 MG/ML IJ SOLN
INTRAMUSCULAR | Status: AC
  Filled 2022-07-19: qty 1

## 2022-07-19 MED ORDER — BACITRACIN ZINC 500 UNIT/GM EX OINT
TOPICAL_OINTMENT | CUTANEOUS | Status: DC | PRN
  Administered 2022-07-19: 1 via TOPICAL

## 2022-07-19 MED ORDER — OXYCODONE HCL 5 MG PO TABS: 5.0000 mg | ORAL_TABLET | Freq: Once | ORAL | Status: DC | PRN

## 2022-07-19 MED ORDER — MECLIZINE HCL 25 MG PO TABS
25.0000 mg | ORAL_TABLET | Freq: Three times a day (TID) | ORAL | Status: DC | PRN
  Administered 2022-07-20 – 2022-07-22 (×3): 25 mg via ORAL
  Filled 2022-07-19 (×5): qty 1

## 2022-07-19 MED ORDER — MIDAZOLAM HCL 2 MG/2ML IJ SOLN
INTRAMUSCULAR | Status: AC
  Filled 2022-07-19: qty 2

## 2022-07-19 MED ORDER — DIAZEPAM 5 MG PO TABS
5.0000 mg | ORAL_TABLET | Freq: Four times a day (QID) | ORAL | Status: DC | PRN
  Administered 2022-07-20: 5 mg via ORAL
  Filled 2022-07-19 (×2): qty 1

## 2022-07-19 MED ORDER — POLYETHYLENE GLYCOL 3350 17 G PO PACK: 17.0000 g | PACK | Freq: Every day | ORAL | Status: DC | PRN

## 2022-07-19 MED ORDER — ACETAMINOPHEN 325 MG PO TABS
650.0000 mg | ORAL_TABLET | ORAL | Status: DC | PRN
  Administered 2022-07-20: 650 mg via ORAL
  Administered 2022-07-21 – 2022-07-22 (×3): 325 mg via ORAL
  Filled 2022-07-19 (×4): qty 2

## 2022-07-19 MED ORDER — 0.9 % SODIUM CHLORIDE (POUR BTL) OPTIME
TOPICAL | Status: DC | PRN
  Administered 2022-07-19: 1000 mL

## 2022-07-19 MED ORDER — ACETAMINOPHEN 650 MG RE SUPP: 650.0000 mg | RECTAL | Status: DC | PRN

## 2022-07-19 MED ORDER — SCOPOLAMINE 1 MG/3DAYS TD PT72
1.0000 | MEDICATED_PATCH | TRANSDERMAL | Status: DC
  Administered 2022-07-19: 1.5 mg via TRANSDERMAL
  Filled 2022-07-19: qty 1

## 2022-07-19 MED ORDER — FUROSEMIDE 20 MG PO TABS
20.0000 mg | ORAL_TABLET | Freq: Every day | ORAL | Status: DC
  Administered 2022-07-19 – 2022-07-21 (×2): 20 mg via ORAL
  Filled 2022-07-19 (×2): qty 1

## 2022-07-19 MED ORDER — PROPOFOL 10 MG/ML IV BOLUS
INTRAVENOUS | Status: AC
  Filled 2022-07-19: qty 20

## 2022-07-19 MED ORDER — SUGAMMADEX SODIUM 200 MG/2ML IV SOLN
INTRAVENOUS | Status: DC | PRN
  Administered 2022-07-19: 200 mg via INTRAVENOUS

## 2022-07-19 MED ORDER — SODIUM CHLORIDE 0.9% FLUSH
3.0000 mL | Freq: Two times a day (BID) | INTRAVENOUS | Status: DC
  Administered 2022-07-19 – 2022-07-22 (×4): 3 mL via INTRAVENOUS

## 2022-07-19 MED ORDER — DOCUSATE SODIUM 100 MG PO CAPS
100.0000 mg | ORAL_CAPSULE | Freq: Every day | ORAL | Status: DC
  Administered 2022-07-19 – 2022-07-22 (×4): 100 mg via ORAL
  Filled 2022-07-19 (×4): qty 1

## 2022-07-19 MED ORDER — PROPOFOL 500 MG/50ML IV EMUL
INTRAVENOUS | Status: DC | PRN
  Administered 2022-07-19: 100 ug/kg/min via INTRAVENOUS

## 2022-07-19 MED ORDER — MENTHOL 3 MG MT LOZG: 1.0000 | LOZENGE | OROMUCOSAL | Status: DC | PRN

## 2022-07-19 MED ORDER — ASPIRIN 325 MG PO TABS
325.0000 mg | ORAL_TABLET | Freq: Every day | ORAL | Status: DC
  Administered 2022-07-20 – 2022-07-22 (×3): 325 mg via ORAL
  Filled 2022-07-19 (×3): qty 1

## 2022-07-19 MED ORDER — HYDROMORPHONE HCL 1 MG/ML IJ SOLN
0.2500 mg | INTRAMUSCULAR | Status: DC | PRN
  Administered 2022-07-19 (×2): 0.25 mg via INTRAVENOUS

## 2022-07-19 MED ORDER — VANCOMYCIN HCL 1000 MG IV SOLR
INTRAVENOUS | Status: DC | PRN
  Administered 2022-07-19: 1000 mg

## 2022-07-19 MED ORDER — FENTANYL CITRATE (PF) 250 MCG/5ML IJ SOLN
INTRAMUSCULAR | Status: DC | PRN
  Administered 2022-07-19: 150 ug via INTRAVENOUS
  Administered 2022-07-19: 50 ug via INTRAVENOUS

## 2022-07-19 MED ORDER — INSULIN ASPART 100 UNIT/ML IJ SOLN
0.0000 [IU] | Freq: Every day | INTRAMUSCULAR | Status: DC
  Administered 2022-07-19: 3 [IU] via SUBCUTANEOUS
  Administered 2022-07-21: 2 [IU] via SUBCUTANEOUS

## 2022-07-19 MED ORDER — BISACODYL 10 MG RE SUPP: 10.0000 mg | Freq: Every day | RECTAL | Status: DC | PRN

## 2022-07-19 MED ORDER — ORAL CARE MOUTH RINSE: 15.0000 mL | Freq: Once | OROMUCOSAL | Status: AC

## 2022-07-19 MED ORDER — PRAVASTATIN SODIUM 10 MG PO TABS
20.0000 mg | ORAL_TABLET | Freq: Every day | ORAL | Status: DC
  Administered 2022-07-19 – 2022-07-21 (×3): 20 mg via ORAL
  Filled 2022-07-19 (×3): qty 2

## 2022-07-19 MED ORDER — DEXAMETHASONE SODIUM PHOSPHATE 10 MG/ML IJ SOLN
INTRAMUSCULAR | Status: DC | PRN
  Administered 2022-07-19: 5 mg via INTRAVENOUS

## 2022-07-19 MED ORDER — OXYCODONE HCL 5 MG PO TABS
10.0000 mg | ORAL_TABLET | ORAL | Status: DC | PRN
  Administered 2022-07-19 – 2022-07-20 (×3): 10 mg via ORAL
  Filled 2022-07-19 (×3): qty 2

## 2022-07-19 MED ORDER — ACETAMINOPHEN 500 MG PO TABS
1000.0000 mg | ORAL_TABLET | Freq: Once | ORAL | Status: AC
  Administered 2022-07-19: 1000 mg via ORAL
  Filled 2022-07-19: qty 2

## 2022-07-19 MED ORDER — BACITRACIN ZINC 500 UNIT/GM EX OINT
TOPICAL_OINTMENT | CUTANEOUS | Status: AC
  Filled 2022-07-19: qty 28.35

## 2022-07-19 MED ORDER — PHENYLEPHRINE 80 MCG/ML (10ML) SYRINGE FOR IV PUSH (FOR BLOOD PRESSURE SUPPORT)
PREFILLED_SYRINGE | INTRAVENOUS | Status: DC | PRN
  Administered 2022-07-19: 160 ug via INTRAVENOUS
  Administered 2022-07-19: 80 ug via INTRAVENOUS

## 2022-07-19 MED ORDER — BUPIVACAINE HCL (PF) 0.25 % IJ SOLN
INTRAMUSCULAR | Status: DC | PRN
  Administered 2022-07-19: 20 mL

## 2022-07-19 MED ORDER — ALBUTEROL SULFATE HFA 108 (90 BASE) MCG/ACT IN AERS: 1.0000 | INHALATION_SPRAY | RESPIRATORY_TRACT | Status: DC | PRN

## 2022-07-19 MED ORDER — PHENYLEPHRINE HCL-NACL 20-0.9 MG/250ML-% IV SOLN
INTRAVENOUS | Status: DC | PRN
  Administered 2022-07-19: 20 ug/min via INTRAVENOUS

## 2022-07-19 MED ORDER — PROPOFOL 10 MG/ML IV BOLUS
INTRAVENOUS | Status: DC | PRN
  Administered 2022-07-19: 160 mg via INTRAVENOUS

## 2022-07-19 MED ORDER — ROCURONIUM BROMIDE 10 MG/ML (PF) SYRINGE
PREFILLED_SYRINGE | INTRAVENOUS | Status: DC | PRN
  Administered 2022-07-19: 60 mg via INTRAVENOUS
  Administered 2022-07-19 (×2): 20 mg via INTRAVENOUS

## 2022-07-19 MED ORDER — INSULIN ASPART 100 UNIT/ML IJ SOLN
0.0000 [IU] | Freq: Three times a day (TID) | INTRAMUSCULAR | Status: DC
  Administered 2022-07-20: 2 [IU] via SUBCUTANEOUS
  Administered 2022-07-20 (×2): 3 [IU] via SUBCUTANEOUS
  Administered 2022-07-21: 5 [IU] via SUBCUTANEOUS
  Administered 2022-07-21 – 2022-07-22 (×2): 3 [IU] via SUBCUTANEOUS
  Administered 2022-07-22: 2 [IU] via SUBCUTANEOUS

## 2022-07-19 MED ORDER — SODIUM CHLORIDE 0.9% FLUSH: 3.0000 mL | INTRAVENOUS | Status: DC | PRN

## 2022-07-19 MED ORDER — SODIUM CHLORIDE 0.9 % IV SOLN: 250.0000 mL | INTRAVENOUS | Status: DC

## 2022-07-19 MED ORDER — PHENOL 1.4 % MT LIQD: 1.0000 | OROMUCOSAL | Status: DC | PRN

## 2022-07-19 MED ORDER — FLEET ENEMA 7-19 GM/118ML RE ENEM: 1.0000 | ENEMA | Freq: Once | RECTAL | Status: DC | PRN

## 2022-07-19 MED ORDER — LISINOPRIL 10 MG PO TABS
5.0000 mg | ORAL_TABLET | Freq: Every day | ORAL | Status: DC
  Administered 2022-07-19 – 2022-07-22 (×3): 5 mg via ORAL
  Filled 2022-07-19 (×3): qty 1

## 2022-07-19 MED ORDER — THROMBIN 20000 UNITS EX SOLR
CUTANEOUS | Status: AC
  Filled 2022-07-19: qty 20000

## 2022-07-19 MED ORDER — MIDAZOLAM HCL 2 MG/2ML IJ SOLN
INTRAMUSCULAR | Status: DC | PRN
  Administered 2022-07-19 (×2): 1 mg via INTRAVENOUS

## 2022-07-19 MED ORDER — PANTOPRAZOLE SODIUM 40 MG PO TBEC
40.0000 mg | DELAYED_RELEASE_TABLET | Freq: Every day | ORAL | Status: DC
  Administered 2022-07-20 – 2022-07-22 (×3): 40 mg via ORAL
  Filled 2022-07-19 (×3): qty 1

## 2022-07-19 MED ORDER — ONDANSETRON HCL 4 MG PO TABS: 4.0000 mg | ORAL_TABLET | Freq: Four times a day (QID) | ORAL | Status: DC | PRN

## 2022-07-19 MED ORDER — LEVOTHYROXINE SODIUM 25 MCG PO TABS
50.0000 ug | ORAL_TABLET | Freq: Every day | ORAL | Status: DC
  Administered 2022-07-20 – 2022-07-22 (×3): 50 ug via ORAL
  Filled 2022-07-19 (×3): qty 2

## 2022-07-19 MED ORDER — INSULIN ASPART 100 UNIT/ML IJ SOLN
0.0000 [IU] | INTRAMUSCULAR | Status: AC | PRN
  Administered 2022-07-19 (×2): 2 [IU] via SUBCUTANEOUS
  Filled 2022-07-19: qty 1

## 2022-07-19 MED ORDER — TRAZODONE HCL 150 MG PO TABS
150.0000 mg | ORAL_TABLET | Freq: Every day | ORAL | Status: DC
  Administered 2022-07-19: 150 mg via ORAL
  Filled 2022-07-19: qty 1

## 2022-07-19 MED ORDER — FENTANYL CITRATE (PF) 250 MCG/5ML IJ SOLN
INTRAMUSCULAR | Status: AC
  Filled 2022-07-19: qty 5

## 2022-07-19 MED ORDER — ALBUTEROL SULFATE 2 MG PO TABS
2.0000 mg | ORAL_TABLET | Freq: Every day | ORAL | Status: DC
  Administered 2022-07-20 – 2022-07-22 (×3): 2 mg via ORAL
  Filled 2022-07-19 (×3): qty 1

## 2022-07-19 MED ORDER — BUPIVACAINE HCL (PF) 0.25 % IJ SOLN
INTRAMUSCULAR | Status: AC
  Filled 2022-07-19: qty 30

## 2022-07-19 MED ORDER — ONDANSETRON HCL 4 MG/2ML IJ SOLN
INTRAMUSCULAR | Status: DC | PRN
  Administered 2022-07-19: 4 mg via INTRAVENOUS

## 2022-07-19 MED ORDER — LIDOCAINE 2% (20 MG/ML) 5 ML SYRINGE
INTRAMUSCULAR | Status: DC | PRN
  Administered 2022-07-19: 40 mg via INTRAVENOUS

## 2022-07-19 MED ORDER — PROMETHAZINE HCL 25 MG/ML IJ SOLN: 6.2500 mg | INTRAMUSCULAR | Status: DC | PRN

## 2022-07-19 MED ORDER — HYDROMORPHONE HCL 1 MG/ML IJ SOLN: 1.0000 mg | INTRAMUSCULAR | Status: DC | PRN

## 2022-07-19 MED ORDER — CHLORHEXIDINE GLUCONATE 0.12 % MT SOLN
15.0000 mL | Freq: Once | OROMUCOSAL | Status: AC
  Administered 2022-07-19: 15 mL via OROMUCOSAL
  Filled 2022-07-19: qty 15

## 2022-07-19 MED ORDER — MIDAZOLAM HCL 2 MG/2ML IJ SOLN: 0.5000 mg | Freq: Once | INTRAMUSCULAR | Status: DC | PRN

## 2022-07-19 MED ORDER — LORATADINE 10 MG PO TABS
10.0000 mg | ORAL_TABLET | Freq: Every day | ORAL | Status: DC
  Administered 2022-07-20 – 2022-07-22 (×3): 10 mg via ORAL
  Filled 2022-07-19 (×3): qty 1

## 2022-07-19 MED ORDER — VANCOMYCIN HCL 1000 MG IV SOLR
INTRAVENOUS | Status: AC
  Filled 2022-07-19: qty 20

## 2022-07-19 SURGICAL SUPPLY — 66 items
ADH SKN CLS APL DERMABOND .7 (GAUZE/BANDAGES/DRESSINGS) ×1
APL SKNCLS STERI-STRIP NONHPOA (GAUZE/BANDAGES/DRESSINGS) ×1
BAG COUNTER SPONGE SURGICOUNT (BAG) ×2 IMPLANT
BAG DECANTER FOR FLEXI CONT (MISCELLANEOUS) ×2 IMPLANT
BAG SPNG CNTER NS LX DISP (BAG) ×1
BAND INSRT 18 STRL LF DISP RB (MISCELLANEOUS)
BAND RUBBER #18 3X1/16 STRL (MISCELLANEOUS) IMPLANT
BENZOIN TINCTURE PRP APPL 2/3 (GAUZE/BANDAGES/DRESSINGS) ×4 IMPLANT
BIT DRILL 2.4 (BIT) IMPLANT
BLADE CLIPPER SURG (BLADE) ×2 IMPLANT
BUR MATCHSTICK NEURO 3.0 LAGG (BURR) ×2 IMPLANT
CANISTER SUCT 3000ML PPV (MISCELLANEOUS) ×2 IMPLANT
DERMABOND ADVANCED .7 DNX12 (GAUZE/BANDAGES/DRESSINGS) ×2 IMPLANT
DRAPE C-ARM 42X72 X-RAY (DRAPES) ×4 IMPLANT
DRAPE LAPAROTOMY 100X72 PEDS (DRAPES) ×2 IMPLANT
DRAPE MICROSCOPE SLANT 54X150 (MISCELLANEOUS) IMPLANT
DRSG OPSITE POSTOP 4X6 (GAUZE/BANDAGES/DRESSINGS) ×2 IMPLANT
DRSG OPSITE POSTOP 4X8 (GAUZE/BANDAGES/DRESSINGS) IMPLANT
DURAPREP 26ML APPLICATOR (WOUND CARE) ×2 IMPLANT
ELECT REM PT RETURN 9FT ADLT (ELECTROSURGICAL) ×1
ELECTRODE REM PT RTRN 9FT ADLT (ELECTROSURGICAL) ×2 IMPLANT
EVACUATOR 1/8 PVC DRAIN (DRAIN) IMPLANT
GAUZE 4X4 16PLY ~~LOC~~+RFID DBL (SPONGE) IMPLANT
GAUZE SPONGE 4X4 12PLY STRL (GAUZE/BANDAGES/DRESSINGS) ×2 IMPLANT
GLOVE BIO SURGEON STRL SZ 6.5 (GLOVE) ×2 IMPLANT
GLOVE BIOGEL PI IND STRL 6.5 (GLOVE) ×2 IMPLANT
GLOVE ECLIPSE 9.0 STRL (GLOVE) ×2 IMPLANT
GLOVE EXAM NITRILE XL STR (GLOVE) IMPLANT
GOWN STRL REUS W/ TWL LRG LVL3 (GOWN DISPOSABLE) IMPLANT
GOWN STRL REUS W/ TWL XL LVL3 (GOWN DISPOSABLE) ×2 IMPLANT
GOWN STRL REUS W/TWL 2XL LVL3 (GOWN DISPOSABLE) IMPLANT
GOWN STRL REUS W/TWL LRG LVL3 (GOWN DISPOSABLE)
GOWN STRL REUS W/TWL XL LVL3 (GOWN DISPOSABLE) ×1
KIT BASIN OR (CUSTOM PROCEDURE TRAY) ×2 IMPLANT
KIT TURNOVER KIT B (KITS) ×2 IMPLANT
MARKER SKIN DUAL TIP RULER LAB (MISCELLANEOUS) ×2 IMPLANT
NDL HYPO 25X1 1.5 SAFETY (NEEDLE) ×2 IMPLANT
NDL SPNL 20GX3.5 QUINCKE YW (NEEDLE) ×2 IMPLANT
NEEDLE HYPO 25X1 1.5 SAFETY (NEEDLE) ×1 IMPLANT
NEEDLE SPNL 20GX3.5 QUINCKE YW (NEEDLE) ×1 IMPLANT
NS IRRIG 1000ML POUR BTL (IV SOLUTION) ×2 IMPLANT
PACK LAMINECTOMY NEURO (CUSTOM PROCEDURE TRAY) ×2 IMPLANT
PAD ARMBOARD 7.5X6 YLW CONV (MISCELLANEOUS) ×6 IMPLANT
PIN MAYFIELD SKULL DISP (PIN) ×2 IMPLANT
ROD SPINAL PRE CUT 3.5X70 (Rod) IMPLANT
SCREW MA INFINITY 3.5X12 (Screw) IMPLANT
SCREW MULTI AXIAL 4.0X18 (Screw) IMPLANT
SCREW MULTI AXIAL 4.5X22 (Screw) IMPLANT
SET SCREW INFINITY IFIX THOR (Screw) IMPLANT
SOL ELECTROSURG ANTI STICK (MISCELLANEOUS)
SOLUTION ELECTROSURG ANTI STCK (MISCELLANEOUS) ×2 IMPLANT
SPIKE FLUID TRANSFER (MISCELLANEOUS) ×2 IMPLANT
SPONGE SURGIFOAM ABS GEL 100 (HEMOSTASIS) ×2 IMPLANT
SPONGE T-LAP 4X18 ~~LOC~~+RFID (SPONGE) IMPLANT
STAPLER VISISTAT 35W (STAPLE) IMPLANT
STRIP CLOSURE SKIN 1/2X4 (GAUZE/BANDAGES/DRESSINGS) ×2 IMPLANT
SUT ETHILON 4 0 PS 2 18 (SUTURE) IMPLANT
SUT VIC AB 0 CT1 18XCR BRD8 (SUTURE) ×2 IMPLANT
SUT VIC AB 0 CT1 8-18 (SUTURE) ×1
SUT VIC AB 2-0 CT1 18 (SUTURE) ×2 IMPLANT
SUT VIC AB 3-0 SH 8-18 (SUTURE) ×2 IMPLANT
TAPE CLOTH 4X10 WHT NS (GAUZE/BANDAGES/DRESSINGS) ×2 IMPLANT
TOWEL GREEN STERILE (TOWEL DISPOSABLE) ×2 IMPLANT
TOWEL GREEN STERILE FF (TOWEL DISPOSABLE) ×2 IMPLANT
TRAY FOLEY MTR SLVR 16FR STAT (SET/KITS/TRAYS/PACK) IMPLANT
WATER STERILE IRR 1000ML POUR (IV SOLUTION) ×2 IMPLANT

## 2022-07-19 NOTE — Anesthesia Postprocedure Evaluation (Signed)
Anesthesia Post Note  Patient: MCKENZYE MACKOWSKI  Procedure(s) Performed: Posterior cervical fusion with lateral mass fixation - Cervical four-cervical five, Cervical five-Cervical six - Cervical six-Cervical seven - Cervical seven-Thoracic one with laminectomy (Spine Cervical)     Patient location during evaluation: PACU Anesthesia Type: General Level of consciousness: sedated, patient cooperative and oriented Pain management: pain level controlled Vital Signs Assessment: post-procedure vital signs reviewed and stable Respiratory status: spontaneous breathing, nonlabored ventilation and respiratory function stable Cardiovascular status: blood pressure returned to baseline and stable Postop Assessment: no apparent nausea or vomiting Anesthetic complications: no   No notable events documented.  Last Vitals:  Vitals:   07/19/22 1200 07/19/22 1215  BP: (!) 83/51 (!) 106/57  Pulse: 66 70  Resp: 13 15  Temp:  (!) 36.2 C  SpO2: 90% 95%    Last Pain:  Vitals:   07/19/22 1145  TempSrc:   PainSc: Asleep                 Rhianna Raulerson,E. Jacorey Donaway

## 2022-07-19 NOTE — Brief Op Note (Signed)
07/19/2022  10:28 AM  PATIENT:  Alexandria Webster  57 y.o. female  PRE-OPERATIVE DIAGNOSIS:  stenosis  POST-OPERATIVE DIAGNOSIS:  stenosis  PROCEDURE:  Procedure(s): Posterior cervical fusion with lateral mass fixation - Cervical four-cervical five, Cervical five-Cervical six - Cervical six-Cervical seven - Cervical seven-Thoracic one with laminectomy (N/A)  SURGEON:  Surgeon(s) and Role:    Julio Sicks, MD - Primary  PHYSICIAN ASSISTANT:   ASSISTANTS: bergman,NP   ANESTHESIA:   general  EBL:  200   BLOOD ADMINISTERED:none  DRAINS: (med) Hemovact drain(s) in the deep wound space with  Suction Open   LOCAL MEDICATIONS USED:  MARCAINE     SPECIMEN:  No Specimen  DISPOSITION OF SPECIMEN:  N/A  COUNTS:  YES  TOURNIQUET:  * No tourniquets in log *  DICTATION: .Dragon Dictation  PLAN OF CARE: Admit to inpatient   PATIENT DISPOSITION:  PACU - hemodynamically stable.   Delay start of Pharmacological VTE agent (>24hrs) due to surgical blood loss or risk of bleeding: yes

## 2022-07-19 NOTE — Op Note (Signed)
Date of procedure: 07/19/2022  Date of dictation: Same Service: Neurosurgery  Preoperative diagnosis: Cervical stenosis with myelopathy  Postoperative diagnosis: Same, iatrogenic fracture of right C5 lateral mass  Procedure Name: C5, C6, C7, T1 decompressive laminectomy with C4, C5, C6, C7 and T1 posterior lateral arthrodesis utilizing segmental instrumentation  Surgeon:Ellias Mcelreath A.Lucan Riner, M.D.  Asst. Surgeon: Doran Durand, NP  Assistant utilized for exposure, decompression, fusion, instrumentation and closure  Anesthesia: General  Indication: 57 year old female with progressively worsening bilateral upper extremity numbness paresthesias and weakness with increasing gait instability and multiple falls.  Workup demonstrates evidence of a large calcified protrusion behind the body of C7 with severe cord compression.  Patient also with critical stenosis at C6-7 and C7-T1 and moderately severe stenosis at C5-6.  The patient is felt to be a very poor candidate for operative decompression from an anterior approach.  She presents now for posterior cervical decompression and fusion in hopes of improving her symptoms.  Operative note: After induction of anesthesia, patient was carefully turned prone onto bolsters with her head fixed in [place] pin headrest in neutral position.  Patient's posterior cervical region prepped and draped sterilely.  Incision made from C4 down to T1.  Dissection performed bilaterally.  Retractor placed.  X-rays taken.  Level confirmed.  Decompressive laminectomies were then performed using Leksell rongeurs, [place] under the high-speed drill to remove the entire lamina of C6 and C7.  The inferior two thirds the lamina of C5 and the superior one third of the lamina of T1.  Medial facetectomy was performed to completely decompress the canal.  At this point a very thorough decompression been achieved.  There was no evidence of injury to the thecal sac and nerve roots.  Lateral masses of C5,  C6 and C7 were identified as were the pedicles of T8.  Pilot holes were placed over the plan instrumentation site.  Pilot holes were drilled with a 12 mm guide.  These were ensured to be solidly within bone.  Upon placement of the right C5 lateral mass screw the lateral mass fractured and could not be salvaged.  I made the decision to take the construct up 1 additional level to C4.  Pilot hole was drilled in the midpoint of the articular mass of C4.  A pilot hole was drilled with a 20 degree cephalad and lateral introduction.  These holes were found to be solidly in the bone.  12 mm Medtronic vertex screws were placed bilaterally at C4 on the left at C5, bilaterally at C6 on the right at C7 and then pedicle screws were placed at C7 on the left and T1 bilaterally.  This was done under fluoroscopic guidance.  Each pedicle was tapped.  It was ensured to be in good position.  4.0 mm pedicle screw was used on the left side at C7 4.5 mm pedicle screws were placed bilaterally at T1.  Short segment titanium rod was then placed over the screw heads from C4-T1.  Locking caps placed through the screws and locking caps then engaged with the construct under compression.  Final images reveal good fusion the hardware at the proper operative level with normal alignment of the spine.  Wound was irrigated.  Transverse processes and lateral masses and facets were decorticated.  Morselized autograft was packed posterior laterally.  Vancomycin powder was placed in deep wound space as was a medium Hemovac drain.  Wound was then closed in layers with Vicryl sutures.  Steri-Strips and sterile dressing were applied.  No apparent complications.  Patient tolerated the procedure well and she returns to the recovery room postop.

## 2022-07-19 NOTE — Anesthesia Procedure Notes (Signed)
Procedure Name: Intubation Date/Time: 07/19/2022 8:11 AM  Performed by: Lanell Matar, CRNAPre-anesthesia Checklist: Patient identified, Emergency Drugs available, Suction available and Patient being monitored Patient Re-evaluated:Patient Re-evaluated prior to induction Oxygen Delivery Method: Circle system utilized Preoxygenation: Pre-oxygenation with 100% oxygen Induction Type: IV induction Ventilation: Mask ventilation without difficulty Laryngoscope Size: Glidescope and 3 Grade View: Grade I Tube type: Oral Tube size: 7.0 mm Number of attempts: 1 Airway Equipment and Method: Stylet and Video-laryngoscopy Placement Confirmation: ETT inserted through vocal cords under direct vision, positive ETCO2, CO2 detector and breath sounds checked- equal and bilateral Secured at: 22 cm Tube secured with: Tape Dental Injury: Teeth and Oropharynx as per pre-operative assessment

## 2022-07-19 NOTE — Plan of Care (Signed)

## 2022-07-19 NOTE — Inpatient Diabetes Management (Signed)
Inpatient Diabetes Program Recommendations  AACE/ADA: New Consensus Statement on Inpatient Glycemic Control (2015)  Target Ranges:  Prepandial:   less than 140 mg/dL      Peak postprandial:   less than 180 mg/dL (1-2 hours)      Critically ill patients:  140 - 180 mg/dL   Lab Results  Component Value Date   GLUCAP 147 (H) 07/19/2022   HGBA1C 9.3 (H) 07/14/2022    Review of Glycemic Control  Diabetes history: DM 2 Outpatient Diabetes medications: Amaryl (just started by PCP), Trulicity (just started, pt not taking yet) Current orders for Inpatient glycemic control:  None in Perioperative area A1c 9.3% on 5/2 at PAT appointment  PCP started Amaryl and Trulicity injection outpatient  Inpatient Diabetes Program Recommendations:    Note Decadron 5 mg given during surgery, anticipate some hyperglycemia  -  Start Novolog 0-15 units Q4 hours until eating.  Thanks,  Christena Deem RN, MSN, BC-ADM Inpatient Diabetes Coordinator Team Pager [phone] (8a-5p)

## 2022-07-19 NOTE — H&P (Signed)
Alexandria Webster is an 57 y.o. female.   Chief Complaint: Weakness HPI: 57 year old female with chronic and progressively worsening bilateral upper extremity numbness paresthesias and weakness with increasing gait instability and frequent falls.  Workup demonstrates evidence of severe multifactorial stenosis throughout her lower cervical spine worse at the C6-7 level but also severe at C5-6 and C7-T1.  Patient is felt to be a poor candidate for anterior cervical decompression and fusion and presents now for posterior cervical decompression and fusion in hopes improving her symptoms.  Past Medical History:  Diagnosis Date   Asthma    COPD (chronic obstructive pulmonary disease) (HCC)    DDD (degenerative disc disease), lumbar    Depression    Diabetes mellitus    Gastroparesis    GERD without esophagitis    High cholesterol    Hypertension    Hypothyroidism    PONV (postoperative nausea and vomiting)    PVD (peripheral vascular disease) (HCC)    Wears glasses     Past Surgical History:  Procedure Laterality Date   AORTA - BILATERAL FEMORAL ARTERY BYPASS GRAFT N/A 09/27/2018   Procedure: Redo Exposure  AORTOBIFEMORAL Bypass and bilateral femoral Artery ; Redo Aortobifemoral  BYPASS GRAFT.;  Surgeon: Cephus Shelling, MD;  Location: [facility] OR;  Service: Vascular;  Laterality: N/A;   BACK SURGERY     BIOPSY  10/14/2021   Procedure: BIOPSY;  Surgeon: Lanelle Bal, DO;  [facility];  Service: Endoscopy;;   COLONOSCOPY WITH PROPOFOL N/A 10/14/2021   Procedure: COLONOSCOPY WITH PROPOFOL;  Surgeon: Lanelle Bal, DO;  Location: [facility];  Service: Endoscopy;  Laterality: N/A;  8:30am   ESOPHAGOGASTRODUODENOSCOPY (EGD) WITH PROPOFOL N/A 10/14/2021   Procedure: ESOPHAGOGASTRODUODENOSCOPY (EGD) WITH PROPOFOL;  Surgeon: Lanelle Bal, DO;  Location: [facility];  Service: Endoscopy;  Laterality: N/A;   FEMORAL ARTERY STENT  right leg   TUBAL LIGATION      Family  History  Problem Relation Age of Onset   Stroke Mother    Hypertension Mother    Heart failure Father    Asthma Father    Diabetes Father    Heart disease Father    Emphysema Paternal Grandfather    Colon cancer Neg Hx    Colon polyps Neg Hx    Social History:  reports that she quit smoking about 4 years ago. Her smoking use included cigarettes. She has been exposed to tobacco smoke. Her smokeless tobacco use includes snuff. She reports that she does not drink alcohol and does not use drugs.  Allergies:  Allergies  Allergen Reactions   Metformin And Related Nausea And Vomiting    Medications Prior to Admission  Medication Sig Dispense Refill   albuterol (PROVENTIL) 2 MG tablet Take 2 mg by mouth daily.     albuterol (VENTOLIN HFA) 108 (90 Base) MCG/ACT inhaler Inhale 1-2 puffs into the lungs every 4 (four) hours as needed for wheezing or shortness of breath.     aspirin 325 MG tablet Take 325 mg by mouth daily.     cetirizine (ZYRTEC) 10 MG tablet Take 10 mg by mouth daily.     docusate sodium (COLACE) 100 MG capsule Take 100 mg by mouth daily.     esomeprazole (NEXIUM) 40 MG capsule Take 1 capsule (40 mg total) by mouth 2 (two) times daily before a meal. (Patient taking differently: Take 40 mg by mouth daily.) 30 capsule 4   furosemide (LASIX) 20 MG tablet Take  1 tablet (20 mg total) by mouth every other day. (Patient taking differently: Take 20 mg by mouth daily.)  0   levothyroxine (SYNTHROID) 50 MCG tablet Take 50 mcg by mouth daily before breakfast.     lisinopril (ZESTRIL) 5 MG tablet Take 5 mg by mouth daily.     lovastatin (MEVACOR) 20 MG tablet Take 20 mg by mouth at bedtime.     meclizine (ANTIVERT) 25 MG tablet Take 25 mg by mouth 3 (three) times daily as needed for dizziness.     meloxicam (MOBIC) 7.5 MG tablet Take 1 tablet (7.5 mg total) by mouth daily as needed for pain. (Patient taking differently: Take 7.5 mg by mouth 2 (two) times daily as needed for pain.)      polyethylene glycol powder (MIRALAX) 17 GM/SCOOP powder Take 17 g by mouth daily as needed for moderate constipation.     traZODone (DESYREL) 150 MG tablet Take 150 mg by mouth at bedtime.     glucose blood (ACCU-CHEK AVIVA) test strip Test glucose 4 times a day 150 each 3   Insulin Pen Needle (B-D UF III MINI PEN NEEDLES) 31G X 5 MM MISC Use qhs 100 each 5   predniSONE (STERAPRED UNI-PAK 21 TAB) 10 MG (21) TBPK tablet Take by mouth daily. Take 6 tabs by mouth daily  for 2 days, then 5 tabs for 2 days, then 4 tabs for 2 days, then 3 tabs for 2 days, 2 tabs for 2 days, then 1 tab by mouth daily for 2 days (Patient not taking: Reported on 07/12/2022) 42 tablet 0    Results for orders placed or performed during the hospital encounter of 07/19/22 (from the past 48 hour(s))  Glucose, capillary     Status: Abnormal   Collection Time: 07/19/22  6:23 AM  Result Value Ref Range   Glucose-Capillary 147 (H) 70 - 99 mg/dL    Comment: Glucose reference range applies only to samples taken after fasting for at least 8 hours.   No results found.  Pertinent items noted in HPI and remainder of comprehensive ROS otherwise negative.  Blood pressure (!) 111/98, pulse 82, temperature 97.6 F (36.4 C), temperature source Oral, resp. rate 16, height 5\' 4"  (1.626 m), weight 55.7 kg, last menstrual period 01/21/2012, SpO2 98 %.  Patient is awake and alert.  She is oriented and appropriate.  Speech is fluent.  Judgment and insight are intact.  Cranial nerve function normal bilateral.'s motor examination extremities reveals weakness of grip and intrinsic function bilaterally grading at 4-/5.  She has some mild weakness of wrist extension and triceps function.  She has spastic weakness in both lower extremities.  She is hyperreflexic.  She has evidence of Hoffmann's responses in both hands.  Sensory examination with patchy distal sensory loss from C6 distally.  Examination head ears eyes nose and throat is unremarked.  Chest  and abdomen are benign.  Extremities are free from injury or deformity. Assessment/Plan Cervical stenosis with myelopathy.  Plan C5-6-7 and T1 decompressive laminectomy with posterior cervical fusion utilizing local bone graft and lateral mass instrumentation.  Risks and benefits been explained.  Patient wishes to proceed.  Sherilyn Cooter A Abbegail Matuska 07/19/2022, 7:52 AM

## 2022-07-19 NOTE — Transfer of Care (Signed)
Immediate Anesthesia Transfer of Care Note  Patient: Alexandria Webster  Procedure(s) Performed: Posterior cervical fusion with lateral mass fixation - Cervical four-cervical five, Cervical five-Cervical six - Cervical six-Cervical seven - Cervical seven-Thoracic one with laminectomy (Spine Cervical)  Patient Location: PACU  Anesthesia Type:General  Level of Consciousness: awake, drowsy, and patient cooperative  Airway & Oxygen Therapy: Patient Spontanous Breathing and Patient connected to face mask oxygen  Post-op Assessment: Report given to RN, Post -op Vital signs reviewed and stable, and Patient moving all extremities X 4  Post vital signs: Reviewed and stable  Last Vitals:  Vitals Value Taken Time  BP 115/39 07/19/22 1048  Temp    Pulse 69 07/19/22 1049  Resp 15 07/19/22 1049  SpO2 100 % 07/19/22 1049  Vitals shown include unvalidated device data.  Last Pain:  Vitals:   07/19/22 0621  TempSrc: Oral  PainSc: 8       Patients Stated Pain Goal: 2 (07/19/22 1610)  Complications: No notable events documented.

## 2022-07-19 NOTE — Progress Notes (Signed)
Orthopedic Tech Progress Note Patient Details:  Alexandria Webster June 15, 1965 [id number]  Ortho Devices Type of Ortho Device: Soft collar Ortho Device/Splint Location: NECK Ortho Device/Splint Interventions: Ordered, Application, Adjustment   Post Interventions Patient Tolerated: Well Instructions Provided: Care of device  Donald Pore 07/19/2022, 11:25 AM

## 2022-07-20 ENCOUNTER — Encounter (HOSPITAL_COMMUNITY): Payer: Self-pay | Admitting: Neurosurgery

## 2022-07-20 DIAGNOSIS — M4802 Spinal stenosis, cervical region: Secondary | ICD-10-CM | POA: Diagnosis not present

## 2022-07-20 DIAGNOSIS — I10 Essential (primary) hypertension: Secondary | ICD-10-CM | POA: Diagnosis not present

## 2022-07-20 DIAGNOSIS — J449 Chronic obstructive pulmonary disease, unspecified: Secondary | ICD-10-CM

## 2022-07-20 DIAGNOSIS — I959 Hypotension, unspecified: Secondary | ICD-10-CM

## 2022-07-20 DIAGNOSIS — E1165 Type 2 diabetes mellitus with hyperglycemia: Secondary | ICD-10-CM

## 2022-07-20 DIAGNOSIS — E039 Hypothyroidism, unspecified: Secondary | ICD-10-CM

## 2022-07-20 DIAGNOSIS — K219 Gastro-esophageal reflux disease without esophagitis: Secondary | ICD-10-CM

## 2022-07-20 DIAGNOSIS — G992 Myelopathy in diseases classified elsewhere: Secondary | ICD-10-CM

## 2022-07-20 DIAGNOSIS — Z794 Long term (current) use of insulin: Secondary | ICD-10-CM

## 2022-07-20 LAB — GLUCOSE, CAPILLARY
Glucose-Capillary: 156 mg/dL — ABNORMAL HIGH (ref 70–99)
Glucose-Capillary: 124 mg/dL — ABNORMAL HIGH (ref 70–99)
Glucose-Capillary: 164 mg/dL — ABNORMAL HIGH (ref 70–99)

## 2022-07-20 NOTE — Progress Notes (Signed)
Postop day 1.  Patient complains of soreness.  No new radiating pain numbness or weakness.  Patient with some hypotension this morning making therapy difficult.  She is awake and alert.  She is oriented and appropriate.  Her strength and sensation are somewhat improved from preop.  Still with some mild distal weakness in her grips and intrinsics and still with some spastic weakness in both lower extremities.  Wound clean and dry.  Chest and abdomen benign.  Overall progressing well.  Continue efforts at rehab and mobilization.

## 2022-07-20 NOTE — Progress Notes (Signed)
OT Cancellation Note  Patient Details Name: Alexandria Webster MRN: [id number] DOB: February 22, 1966   Cancelled Treatment:    Reason Eval/Treat Not Completed: Fatigue/lethargy limiting ability to participate Patient reporting not feeling well with noted continued dizziness with PT later in day. OT to continue follow patient and check back on 5/9.  Rosalio Loud, MS Acute Rehabilitation Department Office# [phone]  07/20/2022, 2:42 PM

## 2022-07-20 NOTE — Consult Note (Addendum)
Physical Medicine and Rehabilitation Consult Reason for Consult:Rehab Referring Physician: Dr. Jordan Likes   HPI: Alexandria Webster is a 57 y.o. female with past medical history of asthma, diabetes mellitus, depression, GERD, hypertension, hypothyroidism, peripheral vascular disease who was admitted due to progressively worsening bilateral lower extremity numbness, paresthesias and weakness.  She has been having increased difficulty with gait and has had several falls.  She reports the symptoms worsened significantly after her fall she had about a year ago around last May.   She has been using a walker since this time.  Initially she says her right side was weaker however more recently her left side has been more affected. MRI on 06/20/2022 demonstrated multilevel cervical spondylosis and diffuse spinal stenosis, and severe at C5-6 and C6-7.  Neurosurgery did not feel like she would be a good candidate for ACDF and instead opted for PCDF.  On 07/19/2022 she had C5, C6, C7, T1 decompressive laminectomy with C4, C5, C6, C7 and T1 posterior lateral arthrodesis completed by Dr. Dutch Quint.  She was started on a soft cervical collar.  She continues to have some neck soreness and noted to have some hypotension this morning.  She reports pain is under control with current medications.  Denies bowel incontinence, using regular MiraLAX and stool softener.  Reports last bowel movement was 2 days ago.  She reports she was overall continent of bladder however did use a pee pad for occasional leakage. 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 Patient lives with her daughter who can assist after discharge.  Patient was independent with most ADLs however needed some assistance with bathing and dressing.  She lives in a one-story home with 5 small steps to enter.  Review of Systems  Constitutional:  Negative for chills and fever.  HENT:  Negative for  congestion.   Eyes:        L eye vision loss   Respiratory:  Negative for cough and shortness of breath.   Cardiovascular:  Negative for chest pain.  Gastrointestinal:  Negative for abdominal pain, constipation, diarrhea, nausea and vomiting.  Genitourinary:  Negative for dysuria.  Musculoskeletal:  Positive for neck pain.  Skin:  Negative for rash.  Neurological:  Positive for dizziness, tingling, sensory change and focal weakness.   Past Medical History:  Diagnosis Date   Asthma    COPD (chronic obstructive pulmonary disease) (HCC)    DDD (degenerative disc disease), lumbar    Depression    Diabetes mellitus    Gastroparesis    GERD without esophagitis    High cholesterol    Hypertension    Hypothyroidism    PONV (postoperative nausea and vomiting)    PVD (peripheral vascular disease) (HCC)    Wears glasses    Past Surgical History:  Procedure Laterality Date   AORTA - BILATERAL FEMORAL ARTERY BYPASS GRAFT N/A 09/27/2018   Procedure: Redo Exposure  AORTOBIFEMORAL Bypass and bilateral femoral Artery ; Redo Aortobifemoral  BYPASS GRAFT.;  Surgeon: Cephus Shelling, MD;  Location: [facility] OR;  Service: Vascular;  Laterality: N/A;   BACK SURGERY     BIOPSY  10/14/2021   Procedure: BIOPSY;  Surgeon: Lanelle Bal, DO;  [facility];  Service: Endoscopy;;   COLONOSCOPY WITH PROPOFOL N/A 10/14/2021   Procedure: COLONOSCOPY WITH PROPOFOL;  Surgeon: Lanelle Bal, DO;  [facility];  Service: Endoscopy;  Laterality: N/A;  8:30am   ESOPHAGOGASTRODUODENOSCOPY (EGD) WITH PROPOFOL N/A 10/14/2021   Procedure: ESOPHAGOGASTRODUODENOSCOPY (EGD) WITH PROPOFOL;  Surgeon: Lanelle Bal, DO;  [facility];  Service: Endoscopy;  Laterality: N/A;   FEMORAL ARTERY STENT  right leg   TUBAL LIGATION     Family History  Problem Relation Age of Onset   Stroke Mother    Hypertension Mother    Heart failure Father    Asthma Father    Diabetes Father     Heart disease Father    Emphysema Paternal Grandfather    Colon cancer Neg Hx    Colon polyps Neg Hx    Social History:  reports that she quit smoking about 4 years ago. Her smoking use included cigarettes. She has been exposed to tobacco smoke. Her smokeless tobacco use includes snuff. She reports that she does not drink alcohol and does not use drugs. Allergies:  Allergies  Allergen Reactions   Metformin And Related Nausea And Vomiting   Medications Prior to Admission  Medication Sig Dispense Refill   albuterol (PROVENTIL) 2 MG tablet Take 2 mg by mouth daily.     albuterol (VENTOLIN HFA) 108 (90 Base) MCG/ACT inhaler Inhale 1-2 puffs into the lungs every 4 (four) hours as needed for wheezing or shortness of breath.     aspirin 325 MG tablet Take 325 mg by mouth daily.     cetirizine (ZYRTEC) 10 MG tablet Take 10 mg by mouth daily.     docusate sodium (COLACE) 100 MG capsule Take 100 mg by mouth daily.     esomeprazole (NEXIUM) 40 MG capsule Take 1 capsule (40 mg total) by mouth 2 (two) times daily before a meal. (Patient taking differently: Take 40 mg by mouth daily.) 30 capsule 4   furosemide (LASIX) 20 MG tablet Take 1 tablet (20 mg total) by mouth every other day. (Patient taking differently: Take 20 mg by mouth daily.)  0   levothyroxine (SYNTHROID) 50 MCG tablet Take 50 mcg by mouth daily before breakfast.     lisinopril (ZESTRIL) 5 MG tablet Take 5 mg by mouth daily.     lovastatin (MEVACOR) 20 MG tablet Take 20 mg by mouth at bedtime.     meclizine (ANTIVERT) 25 MG tablet Take 25 mg by mouth 3 (three) times daily as needed for dizziness.     meloxicam (MOBIC) 7.5 MG tablet Take 1 tablet (7.5 mg total) by mouth daily as needed for pain. (Patient taking differently: Take 7.5 mg by mouth 2 (two) times daily as needed for pain.)     polyethylene glycol powder (MIRALAX) 17 GM/SCOOP powder Take 17 g by mouth daily as needed for moderate constipation.     traZODone (DESYREL) 150 MG  tablet Take 150 mg by mouth at bedtime.     glucose blood (ACCU-CHEK AVIVA) test strip Test glucose 4 times a day 150 each 3   Insulin Pen Needle (B-D UF III MINI PEN NEEDLES) 31G X 5 MM MISC Use qhs 100 each 5   predniSONE (STERAPRED UNI-PAK 21 TAB) 10 MG (21) TBPK tablet Take by mouth daily. Take 6 tabs by mouth daily  for 2 days, then 5 tabs for 2 days, then 4 tabs for 2 days, then 3 tabs for 2 days, 2 tabs for 2 days, then 1 tab by mouth daily for 2 days (Patient not taking: Reported on 07/12/2022) 42 tablet 0    Home: Home Living Family/patient expects to be discharged to::  Private residence Living Arrangements: Children, Other relatives Available Help at Discharge: Family, Available 24 hours/day Type of Home: House Home Access: Stairs to enter [employer] of Steps: 5 Entrance Stairs-Rails: Can reach both Home Layout: One level Bathroom Shower/Tub: Artist: Shower seat, Grab bars - tub/shower, Agricultural consultant (2 wheels), Rollator (4 wheels) Additional Comments: patient reported plan is to go stay with daughter like she was prior to surgery. home info above reflects this choice.  Functional History: Prior Function Prior Level of Function : Independent/Modified Independent Mobility Comments: Reports PLOF no AD, but husband and daughter do grocery shopping ADLs Comments: patient reported daughter was assisting with toileting hygiene, bathing, dressing but able to walk with walker and transfer herself per patient report. Functional Status:  Mobility: Bed Mobility Overal bed mobility: Needs Assistance Bed Mobility: Sit to Supine, Supine to Sit Supine to sit: Min assist Sit to supine: Min assist General bed mobility comments: with education on log rolling and importance of not twisting with movements. patient verbalized understanding used grab bar to assist with turning.        ADL: ADL Overall ADL's : Needs assistance/impaired Eating/Feeding:  Minimal assistance, Bed level Grooming: Sitting, Minimal assistance Grooming Details (indicate cue type and reason): patients session was limited by patients onset of soft BP with patient reporting no dizziness but increased pain to 9/10 in neck with sitting EOB. Lower Body Bathing Details (indicate cue type and reason): patient is able to cross legs in bed with increased time. patient was educated on not bending over to complete these tasks. unable to reach feet or LEs effectively at bed level. plan to try sitting EOB to complete task when pain is under control. General ADL Comments: patient was educated on ROM restrictions for BUE, cervical precautions and importance of these rules. patient provided with handout in room.  Cognition: Cognition Overall Cognitive Status: Within Functional Limits for tasks assessed Orientation Level: Oriented X4 Cognition Arousal/Alertness: Awake/alert Behavior During Therapy: WFL for tasks assessed/performed Overall Cognitive Status: Within Functional Limits for tasks assessed General Comments: patient was plesant and cooperative during session.,  Blood pressure (!) 103/50, pulse 81, temperature 98.9 F (37.2 C), temperature source Oral, resp. rate 18, height 5\' 4"  (1.626 m), weight 55.7 kg, last menstrual period 01/21/2012, SpO2 100 %. Physical Exam  General: Alert and oriented x 3, No apparent distress HEENT: Head is normocephalic, atraumatic, PERRLA, EOMI, sclera anicteric, oral mucosa pink and moist Neck: Soft collar in place, honeycomb dressing appears intact, 1 drain at inferior incision with serosanguineous drainage Heart: Reg rate and rhythm. No murmurs rubs or gallops Chest: CTA bilaterally without wheezes, rales, or rhonchi; no distress Abdomen: Soft, non-tender, non-distended, bowel sounds positive. Extremities: No clubbing, cyanosis, or edema. Pulses are 2+ Psych: Pt's affect is appropriate. Pt is cooperative Skin: Clean and intact without  signs of breakdown Neuro: Drowsy but wakes to voice, follows commands, cranial nerves II through XII grossly intact other than very limited vision in her left eye.  EOMI, fluent speech, fair judgment and insight Strength right upper extremity 4 - to 4 out of 5, left upper extremity 2+ out of 5 shoulder abduction, elbow flexion 2+ to 3 out of 5, elbow extension 2 out of 5, wrist extension 2 out of 5, finger flexion 3 out of 5  strength right lower extremity.  4 - out of 5 hip flexion and distally 4/5, left lower extremity 3 to 4- out of 5 Decreasd strength in intrinsic hand muscles  left greater than right Sensation intact light touch in all 4 extremities however she reports sensation is decreased bilaterally at around C6 and below Hyperreflexive bilateral patella left greater than right  No ankle clonus  musculoskeletal: No joint swelling or tenderness noted  Results for orders placed or performed during the hospital encounter of 07/19/22 (from the past 24 hour(s))  Glucose, capillary     Status: Abnormal   Collection Time: 07/19/22 12:59 PM  Result Value Ref Range   Glucose-Capillary 137 (H) 70 - 99 mg/dL  Glucose, capillary     Status: Abnormal   Collection Time: 07/19/22  5:00 PM  Result Value Ref Range   Glucose-Capillary 276 (H) 70 - 99 mg/dL  Glucose, capillary     Status: Abnormal   Collection Time: 07/19/22  9:11 PM  Result Value Ref Range   Glucose-Capillary 261 (H) 70 - 99 mg/dL   Comment 1 Notify RN    Comment 2 Document in Chart   Glucose, capillary     Status: Abnormal   Collection Time: 07/20/22  6:25 AM  Result Value Ref Range   Glucose-Capillary 156 (H) 70 - 99 mg/dL   Comment 1 Notify RN    Comment 2 Document in Chart    DG Cervical Spine 2 or 3 views  Result Date: 07/19/2022 CLINICAL DATA:  Elective surgery. EXAM: CERVICAL SPINE - 2-3 VIEW COMPARISON:  None Available. FINDINGS: Three fluoroscopic spot views of the cervical spine obtained in the operating room in  frontal and lateral projections. Rod and pedicle screw fixation from C3 through C7. Fluoroscopy time 21 seconds. Dose 3.02 mGy. IMPRESSION: Intraoperative fluoroscopy during cervical fusion. Electronically Signed   By: Narda Rutherford M.D.   On: 07/19/2022 10:46   DG C-Arm 1-60 Min-No Report  Result Date: 07/19/2022 Fluoroscopy was utilized by the requesting physician.  No radiographic interpretation.    Assessment/Plan: Diagnosis: Severe cervical stenosis with myelopathy s/p C5,6,7, T1 decompressive laminectomy with PCDF Does the need for close, 24 hr/day medical supervision in concert with the patient's rehab needs make it unreasonable for this patient to be served in a less intensive setting? Yes Co-Morbidities requiring supervision/potential complications:  -COPD, DDD, HTN, Depression, Hypothyroidism, hypotension PVD,Asthma, GERD, Diabetes mellitus A1C 9.3 Due to bladder management, bowel management, safety, skin/wound care, disease management, medication administration, pain management, and patient education, does the patient require 24 hr/day rehab nursing? Yes Does the patient require coordinated care of a physician, rehab nurse, therapy disciplines of PT/OT to address physical and functional deficits in the context of the above medical diagnosis(es)? Yes Addressing deficits in the following areas: balance, endurance, locomotion, strength, transferring, bowel/bladder control, bathing, dressing, feeding, grooming, toileting, and psychosocial support Can the patient actively participate in an intensive therapy program of at least 3 hrs of therapy per day at least 5 days per week? Yes The potential for patient to make measurable gains while on inpatient rehab is excellent Anticipated functional outcomes upon discharge from inpatient rehab are supervision and min assist  with PT, supervision and min assist with OT, n/a with SLP. Estimated rehab length of stay to reach the above functional goals  is: 10-12 days Anticipated discharge destination: Home Overall Rehab/Functional Prognosis: excellent  POST ACUTE RECOMMENDATIONS: This patient's condition is appropriate for continued rehabilitative care in the following setting: CIR Patient has agreed to participate in recommended program. Yes and Potentially patient is little fatigued today however she feels like by time of discharge she could likely tolerate CIR Note that  insurance prior authorization may be required for reimbursement for recommended care.  Comment: I think she would be a good candidate for CIR after completion of her acute medical care   MEDICAL RECOMMENDATIONS: Consider bilateral PRAFO to prevent contracture Sitter TED hose , ABD binder   I have personally performed a face to face diagnostic evaluation of this patient. Additionally, I have examined the patient's medical record including any pertinent labs and radiographic images. If the physician assistant has documented in this note, I have reviewed and edited or otherwise concur with the physician assistant's documentation.  Thanks,  Fanny Dance, MD 07/20/2022

## 2022-07-20 NOTE — Progress Notes (Signed)
Inpatient Rehab Admissions Coordinator:     I met with Pt. To discuss potential CIR admit. She is interested and states that she can d/c home with her daughter. I spoke with her daughter and she stated that she can provide 24/7 min mod assist at her home. I will open case with insurance and pursue for auth.   Megan Salon, MS, CCC-SLP Rehab Admissions Coordinator  [phone] (celll) [phone] (office)

## 2022-07-20 NOTE — Evaluation (Addendum)
Occupational Therapy Evaluation Patient Details Name: Alexandria Webster MRN: [id number] DOB: [birth year] Today's Date: 07/20/2022   History of Present Illness Patient is a 57 year old female who presented with progressively worsening bilateral UE numbness, paresthesias and weakness with increased gait instability. 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 On 5/7 patient underwent C5, C6, C7, T1 decompressive laminectomy with C4, C5, C6, C7 and T1 posterior lateral arthrodesis. PMH: COPD, DM, GERD, depression, DDD, asthma, PVD, HTN.   Clinical Impression   Patient is a 57 year old female who was admitted for above. Patient's session was limited with increased pain in neck and soft BP sitting EOB effecting participation in ADLs. BP was 86/58 mmhg sitting EOB with pain of 9/10 in neck. Nurse made aware. Patient was educated on cervical restrictions, bed mobility and soft collar recommendations. Plan for OT to check on patient this afternoon to see if improvement in BP. Patient would continue to benefit from skilled OT services at this time while admitted and after d/c to address noted deficits in order to improve overall safety and independence in ADLs.       Recommendations for follow up therapy are one component of a multi-disciplinary discharge planning process, led by the attending physician.  Recommendations may be updated based on patient status, additional functional criteria and insurance authorization.   Assistance Recommended at Discharge Frequent or constant Supervision/Assistance  Patient can return home with the following A lot of help with bathing/dressing/bathroom;Assistance with cooking/housework;Direct supervision/assist for medications management;Assist for transportation;Help with stairs or ramp for entrance;Direct supervision/assist for financial management;Assistance with feeding;A lot  of help with walking and/or transfers    Functional Status Assessment  Patient has had a recent decline in their functional status and demonstrates the ability to make significant improvements in function in a reasonable and predictable amount of time.  Equipment Recommendations          Precautions / Restrictions Precautions Precautions: Fall Precaution Comments: cervical precautions,drain, Required Braces or Orthoses: Cervical Brace Cervical Brace: Soft collar Other Brace: can be off in bed, can be off for bathroom trips at night, Restrictions Weight Bearing Restrictions: No      Mobility Bed Mobility Overal bed mobility: Needs Assistance Bed Mobility: Sit to Supine, Supine to Sit     Supine to sit: Min assist Sit to supine: Min assist   General bed mobility comments: with education on log rolling and importance of not twisting with movements. patient verbalized understanding used grab bar to assist with turning.          Balance Overall balance assessment: Needs assistance Sitting-balance support: Feet supported Sitting balance-Leahy Scale: Fair Sitting balance - Comments: sitting EOB with kyphotic posture.             ADL either performed or assessed with clinical judgement   ADL Overall ADL's : Needs assistance/impaired Eating/Feeding: Minimal assistance;Bed level   Grooming: Sitting;Minimal assistance Grooming Details (indicate cue type and reason): patients session was limited by patients onset of soft BP with patient reporting no dizziness but increased pain to 9/10 in neck with sitting EOB.       Lower Body Bathing Details (indicate cue type and reason): patient is able to cross legs in bed with increased time. patient was educated on not bending over to complete these tasks. unable to reach feet or LEs effectively at bed level. plan to try sitting EOB  to complete task when pain is under control.     General ADL Comments: patient was educated on ROM  restrictions for BUE, cervical precautions and importance of these rules. patient provided with handout in room.      Pertinent Vitals/Pain Pain Assessment Pain Assessment: 0-10 Pain Score: 9  Pain Location: neck sitting EOB Pain Descriptors / Indicators: Grimacing, Guarding Pain Intervention(s): Limited activity within patient's tolerance, Monitored during session, Repositioned     Hand Dominance Right   Extremity/Trunk Assessment Upper Extremity Assessment Upper Extremity Assessment: Defer to OT evaluation RUE Deficits / Details: patient was [facility] within cervical precautions patient currently has. MMT not fully assessed as per cervical preacutions. grip strength 4/5 LUE Deficits / Details: patient noted to have lagging L pinky with opening and closing of hand. patient reported numbness in hand but note forearm or upper arm. patient noted to have 3/5 grip strength   Lower Extremity Assessment Lower Extremity Assessment: Generalized weakness   Cervical / Trunk Assessment Cervical / Trunk Assessment: Kyphotic;Neck Surgery   Communication Communication Communication: No difficulties   Cognition Arousal/Alertness: Awake/alert Behavior During Therapy: WFL for tasks assessed/performed Overall Cognitive Status: Within Functional Limits for tasks assessed           General Comments: patient was plesant and cooperative during session.,                Home Living Family/patient expects to be discharged to:: Private residence Living Arrangements: Children;Other relatives Available Help at Discharge: Family;Available 24 hours/day Type of Home: House Home Access: Stairs to enter [employer] of Steps: 5 Entrance Stairs-Rails: Can reach both Home Layout: One level     Bathroom Shower/Tub: Walk-in shower         Home Equipment: Shower seat;Grab bars - tub/shower;Rolling Environmental consultant (2 wheels);Rollator (4 wheels)   Additional Comments: patient reported plan is to go  stay with daughter like she was prior to surgery. home info above reflects this choice.      Prior Functioning/Environment Prior Level of Function : Independent/Modified Independent             Mobility Comments: Reports PLOF no AD, but husband and daughter do grocery shopping ADLs Comments: patient reported daughter was assisting with toileting hygiene, bathing, dressing but able to walk with walker and transfer herself per patient report.        OT Problem List: Decreased activity tolerance;Impaired balance (sitting and/or standing);Decreased coordination;Decreased safety awareness;Decreased knowledge of precautions;Pain;Impaired UE functional use;Decreased knowledge of use of DME or AE;Cardiopulmonary status limiting activity      OT Treatment/Interventions: Therapeutic exercise;Self-care/ADL training;Energy conservation;DME and/or AE instruction;Therapeutic activities;Patient/family education;Balance training    OT Goals(Current goals can be found in the care plan section) Acute Rehab OT Goals Patient Stated Goal: to get back to daughters house OT Goal Formulation: With patient Time For Goal Achievement: 08/03/22 Potential to Achieve Goals: Fair  OT Frequency: Min 2X/week       AM-PAC OT "6 Clicks" Daily Activity     Outcome Measure Help from another person eating meals?: A Little Help from another person taking care of personal grooming?: A Little Help from another person toileting, which includes using toliet, bedpan, or urinal?: A Lot Help from another person bathing (including washing, rinsing, drying)?: A Lot Help from another person to put on and taking off regular upper body clothing?: A Lot Help from another person to put on and taking off regular lower body clothing?: A Lot 6 Click Score: 14  End of Session Equipment Utilized During Treatment: Cervical collar Nurse Communication: Other (comment) (pain levels and soft BP)  Activity Tolerance: Patient limited by  pain;Other (comment) (soft bp) Patient left: in bed;with call bell/phone within reach;with bed alarm set  OT Visit Diagnosis: Unsteadiness on feet (R26.81);Other abnormalities of gait and mobility (R26.89);Muscle weakness (generalized) (M62.81);Pain Pain - part of body:  (neck)                Time: 1610-9604 OT Time Calculation (min): 11 min Charges:  OT General Charges $OT Visit: 1 Visit OT Evaluation $OT Eval Moderate Complexity: 1 Mod  Mykle Pascua OTR/L, MS Acute Rehabilitation Department Office# [phone]   Selinda Flavin 07/20/2022, 12:00 PM

## 2022-07-20 NOTE — Evaluation (Signed)
Physical Therapy Evaluation  Patient Details Name: Alexandria Webster MRN: [id number] DOB: 18-Oct-1965 Today's Date: 07/20/2022  History of Present Illness  Patient is a 57 year old female who presented with progressively worsening bilateral UE numbness, paresthesias and weakness with increased gait instability. 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 On 5/7 patient underwent C5, C6, C7, T1 decompressive laminectomy with C4, C5, C6, C7 and T1 posterior lateral arthrodesis. PMH: COPD, DM, GERD, depression, DDD, asthma, PVD, HTN.   Clinical Impression  Pt admitted with above diagnosis. At the time of PT eval, pt was able to demonstrate transfers with min assist and RW for support. Pt unable to progress mobility at this time due to vertigo. Pt states she takes medication for vertigo every morning. Anticipate pt will progress well, however very limited this morning due to BP and dizziness. Pt was educated on precautions, brace application/wearing schedule and positioning recommendations. Pt currently with functional limitations due to the deficits listed below (see PT Problem List). Pt will benefit from skilled PT to increase their independence and safety with mobility to allow discharge to the venue listed below.         Recommendations for follow up therapy are one component of a multi-disciplinary discharge planning process, led by the attending physician.  Recommendations may be updated based on patient status, additional functional criteria and insurance authorization.  Follow Up Recommendations       Assistance Recommended at Discharge Frequent or constant Supervision/Assistance  Patient can return home with the following  A little help with walking and/or transfers;A little help with bathing/dressing/bathroom;Assistance with cooking/housework;Assist for transportation;Help with stairs or ramp for  entrance    Equipment Recommendations None recommended by PT  Recommendations for Other Services       Functional Status Assessment Patient has had a recent decline in their functional status and demonstrates the ability to make significant improvements in function in a reasonable and predictable amount of time.     Precautions / Restrictions Precautions Precautions: Fall Precaution Comments: cervical precautions,drain, Required Braces or Orthoses: Cervical Brace Cervical Brace: Soft collar Other Brace: can be off in bed, can be off for bathroom trips at night, Restrictions Weight Bearing Restrictions: No      Mobility  Bed Mobility Overal bed mobility: Needs Assistance Bed Mobility: Rolling, Sidelying to Sit, Sit to Sidelying Rolling: Min assist Sidelying to sit: Min assist     Sit to sidelying: Min assist General bed mobility comments: VC's for optimal log roll technique. Assist for full roll and trunk elevation to full sitting position. Increased time to scoot out to get feet on the floor.    Transfers Overall transfer level: Needs assistance Equipment used: Rolling walker (2 wheels) Transfers: Sit to/from Stand Sit to Stand: Min assist           General transfer comment: Assist for power up to full stand. VC's for hand placement on seated surface for safety.    Ambulation/Gait Ambulation/Gait assistance: Min assist Gait Distance (Feet): 3 Feet Assistive device: Rolling walker (2 wheels) Gait Pattern/deviations: Step-to pattern, Decreased stride length, Trunk flexed, Narrow base of support Gait velocity: Decreased Gait velocity interpretation: <1.31 ft/sec, indicative of household ambulator   General Gait Details: Pt took 3-4 side steps at EOB to transition further up towards HOB. Pt endorses dizziness and only able to tolerate lateral steps at this time.  Stairs  Wheelchair Mobility    Modified Rankin (Stroke Patients Only)        Balance Overall balance assessment: Needs assistance Sitting-balance support: Feet supported Sitting balance-Leahy Scale: Fair Sitting balance - Comments: sitting EOB with kyphotic posture.   Standing balance support: During functional activity, Bilateral upper extremity supported, Reliant on assistive device for balance Standing balance-Leahy Scale: Poor                               Pertinent Vitals/Pain Pain Assessment Pain Assessment: Faces Faces Pain Scale: Hurts little more Pain Location: neck sitting EOB Pain Descriptors / Indicators: Grimacing, Guarding Pain Intervention(s): Limited activity within patient's tolerance, Monitored during session, Repositioned    Home Living Family/patient expects to be discharged to:: Private residence Living Arrangements: Children;Other relatives Available Help at Discharge: Family;Available 24 hours/day Type of Home: House Home Access: Stairs to enter Entrance Stairs-Rails: Can reach both Entrance Stairs-Number of Steps: 5   Home Layout: One level Home Equipment: Shower seat;Grab bars - tub/shower;Rolling Walker (2 wheels);Rollator (4 wheels) Additional Comments: patient reported plan is to go stay with daughter like she was prior to surgery. home info above reflects this choice.    Prior Function Prior Level of Function : Independent/Modified Independent             Mobility Comments: Reports PLOF no AD, but husband and daughter do grocery shopping ADLs Comments: patient reported daughter was assisting with toileting hygiene, bathing, dressing but able to walk with walker and transfer herself per patient report.     Hand Dominance   Dominant Hand: Right    Extremity/Trunk Assessment   Upper Extremity Assessment Upper Extremity Assessment: Defer to OT evaluation RUE Deficits / Details: patient was [facility] within cervical precautions patient currently has. MMT not fully assessed as per cervical preacutions. grip  strength 4/5 LUE Deficits / Details: patient noted to have lagging L pinky with opening and closing of hand. patient reported numbness in hand but note forearm or upper arm. patient noted to have 3/5 grip strength    Lower Extremity Assessment Lower Extremity Assessment: Generalized weakness    Cervical / Trunk Assessment Cervical / Trunk Assessment: Kyphotic;Neck Surgery  Communication   Communication: No difficulties  Cognition Arousal/Alertness: Awake/alert Behavior During Therapy: WFL for tasks assessed/performed Overall Cognitive Status: Within Functional Limits for tasks assessed                                 General Comments: patient was plesant and cooperative during session.,        General Comments General comments (skin integrity, edema, etc.): BP both high and low throughout session. initially 63/31 supine in bed. Pt performed LE exercise prior to progressing mobility. BP then >200/150 on multiple attempts at R and LUE. RLE BP 103/50.    Exercises General Exercises - Lower Extremity Ankle Circles/Pumps: 20 reps, Both Heel Slides: 10 reps, Both   Assessment/Plan    PT Assessment Patient needs continued PT services  PT Problem List Decreased strength;Decreased activity tolerance;Decreased balance;Decreased mobility;Decreased knowledge of use of DME;Decreased safety awareness;Decreased knowledge of precautions;Pain       PT Treatment Interventions DME instruction;Gait training;Stair training;Functional mobility training;Therapeutic activities;Therapeutic exercise;Balance training;Patient/family education    PT Goals (Current goals can be found in the Care Plan section)  Acute Rehab PT Goals Patient Stated Goal: None stated PT Goal Formulation: With  patient Time For Goal Achievement: 07/27/22 Potential to Achieve Goals: Good    Frequency Min 5X/week     Co-evaluation               AM-PAC PT "6 Clicks" Mobility  Outcome Measure Help  needed turning from your back to your side while in a flat bed without using bedrails?: A Little Help needed moving from lying on your back to sitting on the side of a flat bed without using bedrails?: A Little Help needed moving to and from a bed to a chair (including a wheelchair)?: A Little Help needed standing up from a chair using your arms (e.g., wheelchair or bedside chair)?: A Little Help needed to walk in hospital room?: Total Help needed climbing 3-5 steps with a railing? : Total 6 Click Score: 14    End of Session Equipment Utilized During Treatment: Gait belt;Cervical collar Activity Tolerance: Treatment limited secondary to medical complications (Comment) (dizziness and BP) Patient left: in bed;with call bell/phone within reach Nurse Communication: Mobility status (BP status) PT Visit Diagnosis: Unsteadiness on feet (R26.81);Pain Pain - part of body:  (neck)    Time: 1610-9604 PT Time Calculation (min) (ACUTE ONLY): 29 min   Charges:   PT Evaluation $PT Eval Low Complexity: 1 Low PT Treatments $Gait Training: 8-22 mins        Conni Slipper, PT, DPT Acute Rehabilitation Services Secure Chat Preferred Office: [phone]   Marylynn Pearson 07/20/2022, 11:58 AM

## 2022-07-21 LAB — GLUCOSE, CAPILLARY
Glucose-Capillary: 79 mg/dL (ref 70–99)
Glucose-Capillary: 176 mg/dL — ABNORMAL HIGH (ref 70–99)
Glucose-Capillary: 204 mg/dL — ABNORMAL HIGH (ref 70–99)

## 2022-07-21 MED ORDER — HYDROCODONE-ACETAMINOPHEN 5-325 MG PO TABS
1.0000 | ORAL_TABLET | ORAL | Status: DC | PRN
  Administered 2022-07-21 (×2): 1 via ORAL
  Administered 2022-07-21 (×2): 2 via ORAL
  Administered 2022-07-22 (×2): 1 via ORAL
  Filled 2022-07-21 (×2): qty 1
  Filled 2022-07-21 (×2): qty 2
  Filled 2022-07-21 (×2): qty 1

## 2022-07-21 NOTE — Plan of Care (Signed)
  Problem: Activity: Goal: Ability to avoid complications of mobility impairment will improve Outcome: Completed/Met Goal: Ability to tolerate increased activity will improve Outcome: Completed/Met Goal: Will remain free from falls Outcome: Completed/Met   Problem: Bowel/Gastric: Goal: Gastrointestinal status for postoperative course will improve Outcome: Completed/Met   Problem: Clinical Measurements: Goal: Ability to maintain clinical measurements within normal limits will improve Outcome: Completed/Met Goal: Postoperative complications will be avoided or minimized Outcome: Completed/Met Goal: Diagnostic test results will improve Outcome: Completed/Met   Problem: Pain Management: Goal: Pain level will decrease Outcome: Completed/Met   Problem: Skin Integrity: Goal: Will show signs of wound healing Outcome: Completed/Met

## 2022-07-21 NOTE — Progress Notes (Signed)
Physical Therapy Treatment  Patient Details Name: KRYSTEN HOOVEN MRN: [id number] DOB: 1965-09-03 Today's Date: 07/21/2022   History of Present Illness Patient is a 57 year old female who presented with progressively worsening bilateral UE numbness, paresthesias and weakness with increased gait instability. 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 On 5/7 patient underwent C5, C6, C7, T1 decompressive laminectomy with C4, C5, C6, C7 and T1 posterior lateral arthrodesis. PMH: COPD, DM, GERD, depression, DDD, asthma, PVD, HTN.    PT Comments    Pt progressing slowly with post-op mobility. She was able to demonstrate transfers and ambulation with gross min assist and chair follow for safety/seated rest breaks during gait training. Pt was educated on precautions, brace application/wearing schedule, appropriate activity progression, and car transfer. Will continue to follow.      Recommendations for follow up therapy are one component of a multi-disciplinary discharge planning process, led by the attending physician.  Recommendations may be updated based on patient status, additional functional criteria and insurance authorization.  Follow Up Recommendations       Assistance Recommended at Discharge Frequent or constant Supervision/Assistance  Patient can return home with the following A little help with walking and/or transfers;A little help with bathing/dressing/bathroom;Assistance with cooking/housework;Assist for transportation;Help with stairs or ramp for entrance   Equipment Recommendations  None recommended by PT    Recommendations for Other Services       Precautions / Restrictions Precautions Precautions: Fall;Cervical Precaution Booklet Issued: Yes (comment) Precaution Comments: Drain; Pt was cued for precautions during functional mobility. Required Braces or Orthoses: Cervical  Brace Cervical Brace: Soft collar Other Brace: can be off in bed, can be off for bathroom trips at night Restrictions Weight Bearing Restrictions: No     Mobility  Bed Mobility Overal bed mobility: Needs Assistance Bed Mobility: Sidelying to Sit, Sit to Sidelying   Sidelying to sit: Min guard     Sit to sidelying: Min guard General bed mobility comments: Increased time and effort but pt able to transition to/from EOB without assistance. VC's for log roll technique throughout    Transfers Overall transfer level: Needs assistance Equipment used: Rolling walker (2 wheels) Transfers: Sit to/from Stand Sit to Stand: Min assist           General transfer comment: VC's for hand placement on seated surface for safety. Min assist for power up to full stand and to gain/maintain standing balance.    Ambulation/Gait Ambulation/Gait assistance: Min assist, +2 safety/equipment Gait Distance (Feet): 15 Feet (x3 bouts) Assistive device: Rolling walker (2 wheels) Gait Pattern/deviations: Decreased stride length, Trunk flexed, Narrow base of support, Step-through pattern Gait velocity: Decreased Gait velocity interpretation: <1.31 ft/sec, indicative of household ambulator   General Gait Details: Pt with extreme cervical and trunk flexion but able to briefly improve with multimodal cues. Pt ambulated ~15' x3 bouts with seated rest break in between each trial. Assist for balance support and safety. Chair follow utilized.   Stairs             Wheelchair Mobility    Modified Rankin (Stroke Patients Only)       Balance Overall balance assessment: Needs assistance Sitting-balance support: Feet supported Sitting balance-Leahy Scale: Fair Sitting balance - Comments: sitting EOB with kyphotic posture.   Standing balance support: Single extremity supported, Bilateral upper extremity supported, During functional activity Standing balance-Leahy Scale: Poor Standing balance  comment: Reliant on  UE support                            Cognition Arousal/Alertness: Awake/alert Behavior During Therapy: WFL for tasks assessed/performed Overall Cognitive Status: Within Functional Limits for tasks assessed                                          Exercises      General Comments General comments (skin integrity, edema, etc.): BP in supine 112/94, seated on EOB 112/81, seated in recliner 127/62, attempted standing BP with increased time to get reading and patient required seated break before reading completed 113/56 after sitting      Pertinent Vitals/Pain Pain Assessment Pain Assessment: Faces Faces Pain Scale: Hurts little more Pain Location: neck Pain Descriptors / Indicators: Grimacing, Guarding Pain Intervention(s): Limited activity within patient's tolerance, Monitored during session, Repositioned    Home Living                          Prior Function            PT Goals (current goals can now be found in the care plan section) Acute Rehab PT Goals Patient Stated Goal: None stated PT Goal Formulation: With patient Time For Goal Achievement: 07/27/22 Potential to Achieve Goals: Good Progress towards PT goals: Progressing toward goals    Frequency    Min 5X/week      PT Plan Current plan remains appropriate    Co-evaluation              AM-PAC PT "6 Clicks" Mobility   Outcome Measure  Help needed turning from your back to your side while in a flat bed without using bedrails?: A Little Help needed moving from lying on your back to sitting on the side of a flat bed without using bedrails?: A Little Help needed moving to and from a bed to a chair (including a wheelchair)?: A Little Help needed standing up from a chair using your arms (e.g., wheelchair or bedside chair)?: A Little Help needed to walk in hospital room?: A Little Help needed climbing 3-5 steps with a railing? : Total 6 Click  Score: 16    End of Session Equipment Utilized During Treatment: Gait belt;Cervical collar Activity Tolerance: Patient tolerated treatment well Patient left: in bed;with call bell/phone within reach Nurse Communication: Mobility status PT Visit Diagnosis: Unsteadiness on feet (R26.81);Pain Pain - part of body:  (neck)     Time: 1610-9604 PT Time Calculation (min) (ACUTE ONLY): 26 min  Charges:  $Gait Training: 23-37 mins                     Conni Slipper, PT, DPT Acute Rehabilitation Services Secure Chat Preferred Office: [phone]    Marylynn Pearson 07/21/2022, 10:54 AM

## 2022-07-21 NOTE — Inpatient Diabetes Management (Signed)
Inpatient Diabetes Program Recommendations  AACE/ADA: New Consensus Statement on Inpatient Glycemic Control (2015)  Target Ranges:  Prepandial:   less than 140 mg/dL      Peak postprandial:   less than 180 mg/dL (1-2 hours)      Critically ill patients:  140 - 180 mg/dL   Lab Results  Component Value Date   GLUCAP 176 (H) 07/21/2022   HGBA1C 9.3 (H) 07/14/2022    Review of Glycemic Control  Latest Reference Range & Units 07/20/22 06:25 07/20/22 11:47 07/20/22 16:31 07/20/22 21:10 07/21/22 06:32  Glucose-Capillary 70 - 99 mg/dL 409 (H) 811 (H) 914 (H) 79 176 (H)   Diabetes history: DM 2 Outpatient Diabetes medications: recently started back on Trulicity and Amaryl Current orders for Inpatient glycemic control:  Novolog 0-15 units tid + hs  A1c 9.3% on 5/2 PAT appt  Inpatient Diabetes Program Recommendations:    Spoke with pt and daughter at bedside regarding A1c 9.3% and glucose control at home. Discussed importance of glucose control. Pt recently placed back on medication after finding A1c 9.3% at PAT appt. Pt reports that she had previously been take off of the medication due to a normal A1c and that she was having hypoglycemia on Trulicity. Pt received Decadron 5 mg during surgery. Will follow glucose trends. While here.   Thanks,  Christena Deem RN, MSN, BC-ADM Inpatient Diabetes Coordinator Team Pager [phone] (8a-5p)

## 2022-07-21 NOTE — Discharge Instructions (Signed)
The medication I spoke with you about for glucose control that does not drop glucose too much and does not harm the kidneys is called Tradjenta. We use Novolog insulin in the hospital based off of a scale to control glucose trends. You got steroids Decadron 5 mg during the procedure to prevent Nausea from anesthesia.

## 2022-07-21 NOTE — Discharge Summary (Signed)
Physician Discharge Summary  Patient ID: Alexandria Webster MRN: [id number] DOB/AGE: [birth year] 57 y.o.  Admit date: 07/19/2022 Discharge date: 07/22/2022  Admission Diagnoses:  Discharge Diagnoses:  Principal Problem:   Stenosis of cervical spine with myelopathy [facility])   Discharged Condition: good  Hospital Course: Patient admitted the hospital where she went uncomplicated posterior cervical decompression and fusion from C4-T1.  Postoperatively doing reasonably well.  Neck pain well-controlled.  No new radicular symptoms.  Patient with chronic myelopathy but some general improvement following surgery.  Her wound has been healing well.  She is progressing slowly with therapy.  Plan is for discharge to inpatient rehabilitation.  Consults:   Significant Diagnostic Studies:   Treatments:   Discharge Exam: Blood pressure (!) 106/50, pulse 93, temperature 98.2 F (36.8 C), temperature source Oral, resp. rate 18, height 5\' 4"  (1.626 m), weight 55.7 kg, last menstrual period 01/21/2012, SpO2 97 %. Awake and alert.  Oriented and appropriate.  Motor examination with 5/5 strength in both deltoids and biceps muscle group.  Normal wrist extension.  4/5 triceps function bilaterally.  4-/5 grip and intrinsic strength.  Lower extremity with 4+/5 strength in her lower extremities with increased tone.  Sensory examination with patchy distal sensory loss in her upper extremities.  Wound clean and dry.  Chest and abdomen benign.  Disposition:    Allergies as of 07/22/2022       Reactions   Metformin And Related Nausea And Vomiting        Medication List     ASK your doctor about these medications    albuterol 2 MG tablet Commonly known as: PROVENTIL Take 2 mg by mouth daily.   albuterol 108 (90 Base) MCG/ACT inhaler Commonly known as: VENTOLIN HFA Inhale 1-2 puffs into the lungs every 4 (four) hours as needed for wheezing or shortness of breath.   aspirin 325 MG tablet Take 325 mg by  mouth daily.   cetirizine 10 MG tablet Commonly known as: ZYRTEC Take 10 mg by mouth daily.   docusate sodium 100 MG capsule Commonly known as: COLACE Take 100 mg by mouth daily.   esomeprazole 40 MG capsule Commonly known as: NEXIUM Take 1 capsule (40 mg total) by mouth 2 (two) times daily before a meal.   furosemide 20 MG tablet Commonly known as: Lasix Take 1 tablet (20 mg total) by mouth every other day.   glucose blood test strip Commonly known as: Accu-Chek Aviva Test glucose 4 times a day   Insulin Pen Needle 31G X 5 MM Misc Commonly known as: B-D UF III MINI PEN NEEDLES Use qhs   levothyroxine 50 MCG tablet Commonly known as: SYNTHROID Take 50 mcg by mouth daily before breakfast.   lisinopril 5 MG tablet Commonly known as: ZESTRIL Take 5 mg by mouth daily.   lovastatin 20 MG tablet Commonly known as: MEVACOR Take 20 mg by mouth at bedtime.   meclizine 25 MG tablet Commonly known as: ANTIVERT Take 25 mg by mouth 3 (three) times daily as needed for dizziness.   meloxicam 7.5 MG tablet Commonly known as: MOBIC Take 1 tablet (7.5 mg total) by mouth daily as needed for pain.   MiraLax 17 GM/SCOOP powder Generic drug: polyethylene glycol powder Take 17 g by mouth daily as needed for moderate constipation.   predniSONE 10 MG (21) Tbpk tablet Commonly known as: STERAPRED UNI-PAK 21 TAB Take by mouth daily. Take 6 tabs by mouth daily  for 2 days, then 5 tabs for 2  days, then 4 tabs for 2 days, then 3 tabs for 2 days, 2 tabs for 2 days, then 1 tab by mouth daily for 2 days   traZODone 150 MG tablet Commonly known as: DESYREL Take 150 mg by mouth at bedtime.               Durable Medical Equipment  (From admission, onward)           Start     Ordered   07/19/22 1258  DME Walker rolling  Once       Question:  Patient needs a walker to treat with the following condition  Answer:  Cervical myelopathy (HCC)   07/19/22 1257   07/19/22 1258  DME 3 n  1  Once        07/19/22 1257             Signed: Sherilyn Cooter A Deliliah Spranger 07/21/2022, 9:51 AM

## 2022-07-21 NOTE — Progress Notes (Signed)
Postop day 2.  Overall progressing reasonably well.  Neck pain well-controlled.  Upper and lower extremity weakness improved but still present.  Wound clean and dry.  Chest and abdomen benign.  Overall progressing well following multilevel cervical thoracic decompression and fusion.  Continue efforts at mobilization.  Plan for discharge to inpatient rehab today.

## 2022-07-21 NOTE — Progress Notes (Signed)
Occupational Therapy Treatment Patient Details Name: Alexandria Webster MRN: [id number] DOB: 06-19-1965 Today's Date: 07/21/2022   History of present illness Patient is a 57 year old female who presented with progressively worsening bilateral UE numbness, paresthesias and weakness with increased gait instability. 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 On 5/7 patient underwent C5, C6, C7, T1 decompressive laminectomy with C4, C5, C6, C7 and T1 posterior lateral arthrodesis. PMH: COPD, DM, GERD, depression, DDD, asthma, PVD, HTN.   OT comments  Patient received in supine with no complaints of dizziness and BP 112/94. Patient was min assist and education for log rolling to get to EOB. Once on EOB patient with complaints of dizziness and BP 112/81. Once dizziness subsided patient was min assist to transfer to recliner and BP 127/62 seated in recliner. Patient educated on reacher use for LB dressing and mod assist.  Standing BP attempted with increased time for reading and patient requiring seated break before completed and after sitting BP 113/56. Patient will benefit from intensive inpatient follow up therapy, >3 hours/day to address AE training, bathing, dressing, and functional transfer.    Recommendations for follow up therapy are one component of a multi-disciplinary discharge planning process, led by the attending physician.  Recommendations may be updated based on patient status, additional functional criteria and insurance authorization.    Assistance Recommended at Discharge Frequent or constant Supervision/Assistance  Patient can return home with the following  A lot of help with bathing/dressing/bathroom;Assistance with cooking/housework;Direct supervision/assist for medications management;Assist for transportation;Help with stairs or ramp for entrance;Direct supervision/assist for financial  management;Assistance with feeding;A lot of help with walking and/or transfers   Equipment Recommendations  Other (comment) (defer to next venue)    Recommendations for Other Services Rehab consult    Precautions / Restrictions Precautions Precautions: Fall Precaution Comments: cervical precautions,drain, Required Braces or Orthoses: Cervical Brace Cervical Brace: Soft collar Other Brace: can be off in bed, can be off for bathroom trips at night, Restrictions Weight Bearing Restrictions: No       Mobility Bed Mobility Overal bed mobility: Needs Assistance Bed Mobility: Rolling, Sidelying to Sit Rolling: Min assist Sidelying to sit: Min assist       General bed mobility comments: education on log rolling technique with assistance to raise trunk and increased time to scoot to EOB    Transfers Overall transfer level: Needs assistance Equipment used: Rolling walker (2 wheels) Transfers: Sit to/from Stand, Bed to chair/wheelchair/BSC Sit to Stand: Min assist     Step pivot transfers: Min assist     General transfer comment: cues for hand placement and min assist to power up, min assist for managing RW     Balance Overall balance assessment: Needs assistance Sitting-balance support: Feet supported Sitting balance-Leahy Scale: Fair Sitting balance - Comments: sitting EOB with kyphotic posture.   Standing balance support: Single extremity supported, Bilateral upper extremity supported, During functional activity Standing balance-Leahy Scale: Poor Standing balance comment: attempted to assist with pulling up clothing with one extremity but required max assist to perform. Stood for standing BP with limited standing tolerance, unable to stand long enough to get reading                           ADL either performed or assessed with clinical judgement   ADL Overall ADL's : Needs assistance/impaired  Grooming: Wash/dry hands;Wash/dry face;Brushing  hair;Minimal assistance;Sitting Grooming Details (indicate cue type and reason): assistance with combing hair             Lower Body Dressing: Moderate assistance;With adaptive equipment;Cueing for sequencing Lower Body Dressing Details (indicate cue type and reason): education on reacher use for LB dressing with assistance to thread legs into clothing with reacher and to mod assist to pull up clothing               General ADL Comments: education on AE use for LB dressing could benefit from further training    Extremity/Trunk Assessment              Vision       Perception     Praxis      Cognition Arousal/Alertness: Awake/alert Behavior During Therapy: [facility] for tasks assessed/performed Overall Cognitive Status: Within Functional Limits for tasks assessed                                 General Comments: believed it was Tuesday and 2004, reviewed cervical precautions with patient recalling 0/3        Exercises      Shoulder Instructions       General Comments BP in supine 112/94, seated on EOB 112/81, seated in recliner 127/62, attempted standing BP with increased time to get reading and patient required seated break before reading completed 113/56 after sitting    Pertinent Vitals/ Pain       Pain Assessment Pain Assessment: Faces Faces Pain Scale: Hurts little more Pain Location: neck Pain Descriptors / Indicators: Grimacing, Guarding Pain Intervention(s): Limited activity within patient's tolerance, Monitored during session, Repositioned  Home Living                                          Prior Functioning/Environment              Frequency  Min 2X/week        Progress Toward Goals  OT Goals(current goals can now be found in the care plan section)  Progress towards OT goals: Progressing toward goals  Acute Rehab OT Goals Patient Stated Goal: get better OT Goal Formulation: With patient Time For  Goal Achievement: 08/03/22 Potential to Achieve Goals: Fair ADL Goals Pt Will Perform Lower Body Bathing: with modified independence;sit to/from stand;with adaptive equipment Pt Will Perform Lower Body Dressing: with modified independence;with adaptive equipment;sit to/from stand Pt Will Transfer to Toilet: with modified independence;ambulating Pt Will Perform Tub/Shower Transfer: with supervision;with caregiver independent in assisting;3 in 1 Additional ADL Goal #1: Pt will independently verbalize 3 strateiges to reduce risk of falls Additional ADL Goal #2: Pt will independently verbalize 3 cervical precautions for ADL and mobility  Plan Discharge plan remains appropriate    Co-evaluation                 AM-PAC OT "6 Clicks" Daily Activity     Outcome Measure   Help from another person eating meals?: A Little Help from another person taking care of personal grooming?: A Little Help from another person toileting, which includes using toliet, bedpan, or urinal?: A Lot Help from another person bathing (including washing, rinsing, drying)?: A Lot Help from another person to put on and taking off regular upper body clothing?: A Lot  Help from another person to put on and taking off regular lower body clothing?: A Lot 6 Click Score: 14    End of Session Equipment Utilized During Treatment: Gait belt;Rolling walker (2 wheels);Cervical collar  OT Visit Diagnosis: Unsteadiness on feet (R26.81);Other abnormalities of gait and mobility (R26.89);Muscle weakness (generalized) (M62.81);Pain Pain - part of body:  (neck)   Activity Tolerance Patient tolerated treatment well   Patient Left in chair;with call bell/phone within reach   Nurse Communication Mobility status;Other (comment) (BP readings)        Time: 7829-5621 OT Time Calculation (min): 36 min  Charges: OT General Charges $OT Visit: 1 Visit OT Treatments $Self Care/Home Management : 23-37 mins  Alfonse Flavors, OTA Acute  Rehabilitation Services  Office [phone]   Dewain Penning 07/21/2022, 8:18 AM

## 2022-07-22 ENCOUNTER — Encounter (HOSPITAL_COMMUNITY): Payer: Self-pay | Admitting: Physical Medicine and Rehabilitation

## 2022-07-22 ENCOUNTER — Other Ambulatory Visit: Payer: Self-pay

## 2022-07-22 ENCOUNTER — Inpatient Hospital Stay (HOSPITAL_COMMUNITY)
Admission: RE | Admit: 2022-07-22 | Discharge: 2022-08-05 | DRG: 945 | Disposition: A | Discharge status: Home / Self Care | Payer: 59 | Source: Intra-hospital | Attending: Physical Medicine and Rehabilitation | Admitting: Physical Medicine and Rehabilitation

## 2022-07-22 DIAGNOSIS — Z8249 Family history of ischemic heart disease and other diseases of the circulatory system: Secondary | ICD-10-CM

## 2022-07-22 DIAGNOSIS — I951 Orthostatic hypotension: Secondary | ICD-10-CM | POA: Diagnosis not present

## 2022-07-22 DIAGNOSIS — Z825 Family history of asthma and other chronic lower respiratory diseases: Secondary | ICD-10-CM

## 2022-07-22 DIAGNOSIS — E039 Hypothyroidism, unspecified: Secondary | ICD-10-CM | POA: Diagnosis not present

## 2022-07-22 DIAGNOSIS — E1143 Type 2 diabetes mellitus with diabetic autonomic (poly)neuropathy: Secondary | ICD-10-CM | POA: Diagnosis not present

## 2022-07-22 DIAGNOSIS — K5900 Constipation, unspecified: Secondary | ICD-10-CM | POA: Diagnosis not present

## 2022-07-22 DIAGNOSIS — I10 Essential (primary) hypertension: Secondary | ICD-10-CM | POA: Diagnosis present

## 2022-07-22 DIAGNOSIS — G4700 Insomnia, unspecified: Secondary | ICD-10-CM

## 2022-07-22 DIAGNOSIS — Z981 Arthrodesis status: Secondary | ICD-10-CM

## 2022-07-22 DIAGNOSIS — G9589 Other specified diseases of spinal cord: Secondary | ICD-10-CM | POA: Diagnosis not present

## 2022-07-22 DIAGNOSIS — Z823 Family history of stroke: Secondary | ICD-10-CM

## 2022-07-22 DIAGNOSIS — E1142 Type 2 diabetes mellitus with diabetic polyneuropathy: Secondary | ICD-10-CM | POA: Diagnosis not present

## 2022-07-22 DIAGNOSIS — E119 Type 2 diabetes mellitus without complications: Secondary | ICD-10-CM | POA: Diagnosis not present

## 2022-07-22 DIAGNOSIS — Z833 Family history of diabetes mellitus: Secondary | ICD-10-CM

## 2022-07-22 DIAGNOSIS — K219 Gastro-esophageal reflux disease without esophagitis: Secondary | ICD-10-CM | POA: Diagnosis not present

## 2022-07-22 DIAGNOSIS — E7800 Pure hypercholesterolemia, unspecified: Secondary | ICD-10-CM | POA: Diagnosis not present

## 2022-07-22 DIAGNOSIS — G3184 Mild cognitive impairment, so stated: Secondary | ICD-10-CM | POA: Diagnosis present

## 2022-07-22 DIAGNOSIS — Z7984 Long term (current) use of oral hypoglycemic drugs: Secondary | ICD-10-CM

## 2022-07-22 DIAGNOSIS — M47812 Spondylosis without myelopathy or radiculopathy, cervical region: Secondary | ICD-10-CM | POA: Diagnosis present

## 2022-07-22 DIAGNOSIS — Z794 Long term (current) use of insulin: Secondary | ICD-10-CM | POA: Diagnosis not present

## 2022-07-22 DIAGNOSIS — J4489 Other specified chronic obstructive pulmonary disease: Secondary | ICD-10-CM | POA: Diagnosis not present

## 2022-07-22 DIAGNOSIS — Z7982 Long term (current) use of aspirin: Secondary | ICD-10-CM

## 2022-07-22 DIAGNOSIS — E1151 Type 2 diabetes mellitus with diabetic peripheral angiopathy without gangrene: Secondary | ICD-10-CM | POA: Diagnosis not present

## 2022-07-22 DIAGNOSIS — Z720 Tobacco use: Secondary | ICD-10-CM

## 2022-07-22 DIAGNOSIS — R5381 Other malaise: Principal | ICD-10-CM | POA: Diagnosis present

## 2022-07-22 DIAGNOSIS — L853 Xerosis cutis: Secondary | ICD-10-CM | POA: Diagnosis not present

## 2022-07-22 DIAGNOSIS — Z79890 Hormone replacement therapy (postmenopausal): Secondary | ICD-10-CM | POA: Diagnosis not present

## 2022-07-22 DIAGNOSIS — Z79899 Other long term (current) drug therapy: Secondary | ICD-10-CM

## 2022-07-22 DIAGNOSIS — I959 Hypotension, unspecified: Secondary | ICD-10-CM | POA: Diagnosis present

## 2022-07-22 DIAGNOSIS — M7989 Other specified soft tissue disorders: Secondary | ICD-10-CM | POA: Diagnosis not present

## 2022-07-22 DIAGNOSIS — G959 Disease of spinal cord, unspecified: Principal | ICD-10-CM | POA: Diagnosis present

## 2022-07-22 DIAGNOSIS — M4802 Spinal stenosis, cervical region: Secondary | ICD-10-CM | POA: Diagnosis present

## 2022-07-22 DIAGNOSIS — R410 Disorientation, unspecified: Secondary | ICD-10-CM | POA: Diagnosis not present

## 2022-07-22 DIAGNOSIS — K3184 Gastroparesis: Secondary | ICD-10-CM | POA: Diagnosis present

## 2022-07-22 DIAGNOSIS — F17211 Nicotine dependence, cigarettes, in remission: Secondary | ICD-10-CM

## 2022-07-22 DIAGNOSIS — K30 Functional dyspepsia: Secondary | ICD-10-CM | POA: Diagnosis not present

## 2022-07-22 DIAGNOSIS — R296 Repeated falls: Secondary | ICD-10-CM | POA: Diagnosis not present

## 2022-07-22 DIAGNOSIS — M542 Cervicalgia: Secondary | ICD-10-CM | POA: Diagnosis not present

## 2022-07-22 DIAGNOSIS — R509 Fever, unspecified: Secondary | ICD-10-CM | POA: Diagnosis not present

## 2022-07-22 LAB — GLUCOSE, CAPILLARY
Glucose-Capillary: 230 mg/dL — ABNORMAL HIGH (ref 70–99)
Glucose-Capillary: 145 mg/dL — ABNORMAL HIGH (ref 70–99)
Glucose-Capillary: 144 mg/dL — ABNORMAL HIGH (ref 70–99)
Glucose-Capillary: 183 mg/dL — ABNORMAL HIGH (ref 70–99)
Glucose-Capillary: 128 mg/dL — ABNORMAL HIGH (ref 70–99)
Glucose-Capillary: 138 mg/dL — ABNORMAL HIGH (ref 70–99)
Glucose-Capillary: 125 mg/dL — ABNORMAL HIGH (ref 70–99)

## 2022-07-22 MED ORDER — PANTOPRAZOLE SODIUM 40 MG PO TBEC
40.0000 mg | DELAYED_RELEASE_TABLET | Freq: Every day | ORAL | Status: DC
  Administered 2022-07-23 – 2022-08-05 (×14): 40 mg via ORAL
  Filled 2022-07-22 (×14): qty 1

## 2022-07-22 MED ORDER — ONDANSETRON HCL 4 MG PO TABS
4.0000 mg | ORAL_TABLET | Freq: Four times a day (QID) | ORAL | Status: DC | PRN
  Administered 2022-07-31: 4 mg via ORAL
  Filled 2022-07-22: qty 1

## 2022-07-22 MED ORDER — FUROSEMIDE 20 MG PO TABS: 20.0000 mg | ORAL_TABLET | Freq: Every day | ORAL | Status: DC

## 2022-07-22 MED ORDER — POLYETHYLENE GLYCOL 3350 17 G PO PACK
17.0000 g | PACK | Freq: Every day | ORAL | Status: DC | PRN
  Administered 2022-07-25: 17 g via ORAL
  Filled 2022-07-22 (×2): qty 1

## 2022-07-22 MED ORDER — HYDROCODONE-ACETAMINOPHEN 5-325 MG PO TABS
1.0000 | ORAL_TABLET | ORAL | Status: DC | PRN
  Administered 2022-07-23: 2 via ORAL
  Administered 2022-07-26: 1 via ORAL
  Administered 2022-07-26 – 2022-08-05 (×26): 2 via ORAL
  Filled 2022-07-22 (×27): qty 2
  Filled 2022-07-22: qty 1

## 2022-07-22 MED ORDER — BISACODYL 10 MG RE SUPP
10.0000 mg | Freq: Every day | RECTAL | Status: DC | PRN
  Administered 2022-07-29: 10 mg via RECTAL
  Filled 2022-07-22 (×2): qty 1

## 2022-07-22 MED ORDER — LEVOTHYROXINE SODIUM 50 MCG PO TABS
50.0000 ug | ORAL_TABLET | Freq: Every day | ORAL | Status: DC
  Administered 2022-07-23 – 2022-08-05 (×14): 50 ug via ORAL
  Filled 2022-07-22 (×14): qty 1

## 2022-07-22 MED ORDER — DOCUSATE SODIUM 100 MG PO CAPS
100.0000 mg | ORAL_CAPSULE | Freq: Every day | ORAL | Status: DC
  Administered 2022-07-23 – 2022-08-05 (×14): 100 mg via ORAL
  Filled 2022-07-22 (×14): qty 1

## 2022-07-22 MED ORDER — ONDANSETRON HCL 4 MG/2ML IJ SOLN: 4.0000 mg | Freq: Four times a day (QID) | INTRAMUSCULAR | Status: DC | PRN

## 2022-07-22 MED ORDER — ALBUTEROL SULFATE (2.5 MG/3ML) 0.083% IN NEBU: 2.5000 mg | INHALATION_SOLUTION | RESPIRATORY_TRACT | Status: DC | PRN

## 2022-07-22 MED ORDER — DIAZEPAM 5 MG PO TABS
5.0000 mg | ORAL_TABLET | Freq: Four times a day (QID) | ORAL | Status: DC | PRN
  Administered 2022-07-23: 5 mg via ORAL
  Filled 2022-07-22: qty 1

## 2022-07-22 MED ORDER — PRAVASTATIN SODIUM 40 MG PO TABS
20.0000 mg | ORAL_TABLET | Freq: Every day | ORAL | Status: DC
  Administered 2022-07-22 – 2022-08-04 (×14): 20 mg via ORAL
  Filled 2022-07-22 (×14): qty 1

## 2022-07-22 MED ORDER — MECLIZINE HCL 25 MG PO TABS
25.0000 mg | ORAL_TABLET | Freq: Three times a day (TID) | ORAL | Status: DC | PRN
  Administered 2022-07-25 – 2022-08-04 (×8): 25 mg via ORAL
  Filled 2022-07-22 (×8): qty 1

## 2022-07-22 MED ORDER — ASPIRIN 325 MG PO TABS
325.0000 mg | ORAL_TABLET | Freq: Every day | ORAL | Status: DC
  Administered 2022-07-23 – 2022-08-05 (×14): 325 mg via ORAL
  Filled 2022-07-22 (×14): qty 1

## 2022-07-22 MED ORDER — LORATADINE 10 MG PO TABS
10.0000 mg | ORAL_TABLET | Freq: Every day | ORAL | Status: DC
  Administered 2022-07-23 – 2022-08-05 (×14): 10 mg via ORAL
  Filled 2022-07-22 (×14): qty 1

## 2022-07-22 MED ORDER — ALBUTEROL SULFATE 2 MG PO TABS
2.0000 mg | ORAL_TABLET | Freq: Every day | ORAL | Status: DC
  Administered 2022-07-23 – 2022-08-05 (×14): 2 mg via ORAL
  Filled 2022-07-22 (×14): qty 1

## 2022-07-22 MED ORDER — LISINOPRIL 5 MG PO TABS
5.0000 mg | ORAL_TABLET | Freq: Every day | ORAL | Status: DC
  Administered 2022-07-23 – 2022-07-24 (×2): 5 mg via ORAL
  Filled 2022-07-22 (×2): qty 1

## 2022-07-22 MED ORDER — ACETAMINOPHEN 325 MG PO TABS
650.0000 mg | ORAL_TABLET | ORAL | Status: DC | PRN
  Administered 2022-07-23 – 2022-07-26 (×5): 650 mg via ORAL
  Filled 2022-07-22 (×5): qty 2

## 2022-07-22 MED ORDER — TRAZODONE HCL 50 MG PO TABS
150.0000 mg | ORAL_TABLET | Freq: Every day | ORAL | Status: DC
  Administered 2022-07-22 – 2022-07-23 (×2): 150 mg via ORAL
  Filled 2022-07-22 (×3): qty 3

## 2022-07-22 MED ORDER — INSULIN ASPART 100 UNIT/ML IJ SOLN
0.0000 [IU] | Freq: Three times a day (TID) | INTRAMUSCULAR | Status: DC
  Administered 2022-07-22 – 2022-07-23 (×3): 3 [IU] via SUBCUTANEOUS
  Administered 2022-07-23: 2 [IU] via SUBCUTANEOUS
  Administered 2022-07-24 (×3): 3 [IU] via SUBCUTANEOUS
  Administered 2022-07-25: 2 [IU] via SUBCUTANEOUS
  Administered 2022-07-25: 8 [IU] via SUBCUTANEOUS
  Administered 2022-07-25 – 2022-07-27 (×5): 2 [IU] via SUBCUTANEOUS
  Administered 2022-07-28: 3 [IU] via SUBCUTANEOUS
  Administered 2022-07-28: 5 [IU] via SUBCUTANEOUS
  Administered 2022-07-29: 3 [IU] via SUBCUTANEOUS

## 2022-07-22 MED ORDER — POLYETHYLENE GLYCOL 3350 17 GM/SCOOP PO POWD: 17.0000 g | Freq: Every day | ORAL | Status: DC | PRN

## 2022-07-22 MED ORDER — ACETAMINOPHEN 650 MG RE SUPP: 650.0000 mg | RECTAL | Status: DC | PRN

## 2022-07-22 NOTE — Progress Notes (Signed)
PMR Admission Coordinator Pre-Admission Assessment   Patient: Alexandria Webster is an 57 y.o., female MRN: [id number] DOB: 05/27/1965 Height: 5' 4" (162.6 cm) Weight: 55.7 kg   Insurance Information HMO: yes    PPO:      PCP:      IPA:      80/20:      OTHER:  PRIMARY: [employer] [facility] [facility]      Policy#: [id number]      Subscriber: pt CM Name: Sandy       Phone#: [phone]   Fax#: [phone] Pre-Cert#: [id number]       Employer: Sandy with [employer] called 5/10 with approval for admit 07/22/22-08/03/22 with updates due 08/03/22 Benefits:  Phone #:      Name:  Eff Date: 03/14/2022- still active Deductible: does not have one OOP Max: $1,800 ($848.71 met) CIR: 75% coverage, 25% co-insurance SNF: 75% coverage, 25% co-insurance; limited to 90 days/cal (90 remaining) Outpatient:  100% coverage; limited to 30 visits/cal yr (30 remaining) Home Health:  $10 copay/visit DME: 50% coverage; 50% co-insurance Providers: in network    SECONDARY:       Policy#:      Phone#:    Financial Counselor:       Phone#:    The "Data Collection Information Summary" for patients in Inpatient Rehabilitation Facilities with attached "Privacy Act Statement-Health Care Records" was provided and verbally reviewed with: n/a    Emergency Contact Information Contact Information       Name Relation Home Work Mobile    ELLIS,Alexandria Webster Daughter [phone]   [phone]           Current Medical History  Patient Admitting Diagnosis: Cervical Myelopathy  History of Present Illness: Alexandria Webster is a 57-year-old right-handed female with history of diabetes mellitus, hypertension, hyperlipidemia, COPD who quit smoking 4 years ago.  Per chart review patient lives with husband and daughter.  1 level home 5 steps to entry.  Daughter was assisting with ADLs.  Presented 07/19/2022 with bilateral upper extremity numbness and paresthesias as well as weakness with increasing gait instability and frequent falls over  the past year.  Initially patient stated her right side was weaker however more recently her left side has become more affected.  CT/MRI of the brain showed no acute intracranial abnormality there was a small remote lacunar infarct about the thalami and cerebellum.  MRI of the cervical spine showed multilevel cervical spondylosis with resultant diffuse spinal stenosis, severe in nature at C5-6 and C6-7.  Patchy cord signal changes at these levels consistent with myelomalacia.  Question additional subtle cord signal abnormality at the level of C3-4.  Patient underwent C5-6-C7, T1 decompressive laminotomy with C4-5-6-C7 and T1 posterior lateral arthrodesis 07/19/2022 per Dr. Pool.  Placed in a cervical soft collar that can be off when in bed.  Blood pressures have been soft and monitored when up with therapies.  Therapy evaluations completed due to patient's decreased functional mobility was admitted for a comprehensive rehab program.    Patient's medical record from [facility]  has been reviewed by the rehabilitation admission coordinator and physician.   Past Medical History      Past Medical History:  Diagnosis Date   Asthma     COPD (chronic obstructive pulmonary disease) (HCC)     DDD (degenerative disc disease), lumbar     Depression     Diabetes mellitus     Gastroparesis     GERD without esophagitis       High cholesterol     Hypertension     Hypothyroidism     PONV (postoperative nausea and vomiting)     PVD (peripheral vascular disease) (HCC)     Wears glasses        Has the patient had major surgery during 100 days prior to admission? Yes   Family History   family history includes Asthma in her father; Diabetes in her father; Emphysema in her paternal grandfather; Heart disease in her father; Heart failure in her father; Hypertension in her mother; Stroke in her mother.   Current Medications   Current Facility-Administered Medications:    0.9 %  sodium chloride  infusion, 250 mL, Intravenous, Continuous, Pool, Henry, MD   acetaminophen (TYLENOL) tablet 650 mg, 650 mg, Oral, Q4H PRN **OR** acetaminophen (TYLENOL) suppository 650 mg, 650 mg, Rectal, Q4H PRN, Pool, Henry, MD   albuterol (PROVENTIL) tablet 2 mg, 2 mg, Oral, Daily, Pool, Henry, MD, 2 mg at 07/20/22 0830   albuterol (VENTOLIN HFA) 108 (90 Base) MCG/ACT inhaler 1-2 puff, 1-2 puff, Inhalation, Q4H PRN, Pool, Henry, MD   aspirin tablet 325 mg, 325 mg, Oral, Daily, Pool, Henry, MD, 325 mg at 07/20/22 0831   bisacodyl (DULCOLAX) suppository 10 mg, 10 mg, Rectal, Daily PRN, Pool, Henry, MD   diazepam (VALIUM) tablet 5-10 mg, 5-10 mg, Oral, Q6H PRN, Pool, Henry, MD, 5 mg at 07/20/22 0350   docusate sodium (COLACE) capsule 100 mg, 100 mg, Oral, Daily, Pool, Henry, MD, 100 mg at 07/20/22 0831   furosemide (LASIX) tablet 20 mg, 20 mg, Oral, Daily, Pool, Henry, MD, 20 mg at 07/19/22 1701   HYDROcodone-acetaminophen (NORCO) 10-325 MG per tablet 1 tablet, 1 tablet, Oral, Q4H PRN, Pool, Henry, MD, 1 tablet at 07/19/22 1703   HYDROmorphone (DILAUDID) injection 1 mg, 1 mg, Intravenous, Q2H PRN, Pool, Henry, MD   insulin aspart (novoLOG) injection 0-15 Units, 0-15 Units, Subcutaneous, TID WC, Pool, Henry, MD, 2 Units at 07/20/22 1318   insulin aspart (novoLOG) injection 0-5 Units, 0-5 Units, Subcutaneous, QHS, Pool, Henry, MD, 3 Units at 07/19/22 2113   levothyroxine (SYNTHROID) tablet 50 mcg, 50 mcg, Oral, QAC breakfast, Pool, Henry, MD, 50 mcg at 07/20/22 0544   lisinopril (ZESTRIL) tablet 5 mg, 5 mg, Oral, Daily, Pool, Henry, MD, 5 mg at 07/19/22 1701   loratadine (CLARITIN) tablet 10 mg, 10 mg, Oral, Daily, Pool, Henry, MD, 10 mg at 07/20/22 0831   meclizine (ANTIVERT) tablet 25 mg, 25 mg, Oral, TID PRN, Pool, Henry, MD, 25 mg at 07/20/22 1318   menthol-cetylpyridinium (CEPACOL) lozenge 3 mg, 1 lozenge, Oral, PRN **OR** phenol (CHLORASEPTIC) mouth spray 1 spray, 1 spray, Mouth/Throat, PRN, Pool, Henry, MD    ondansetron (ZOFRAN) tablet 4 mg, 4 mg, Oral, Q6H PRN **OR** ondansetron (ZOFRAN) injection 4 mg, 4 mg, Intravenous, Q6H PRN, Pool, Henry, MD   oxyCODONE (Oxy IR/ROXICODONE) immediate release tablet 10 mg, 10 mg, Oral, Q3H PRN, Pool, Henry, MD, 10 mg at 07/20/22 1503   pantoprazole (PROTONIX) EC tablet 40 mg, 40 mg, Oral, Daily, Pool, Henry, MD, 40 mg at 07/20/22 0830   polyethylene glycol (MIRALAX / GLYCOLAX) packet 17 g, 17 g, Oral, Daily PRN, Pool, Henry, MD   polyethylene glycol powder (GLYCOLAX/MIRALAX) container 17 g, 17 g, Oral, Daily PRN, Pool, Henry, MD   pravastatin (PRAVACHOL) tablet 20 mg, 20 mg, Oral, q1800, Pool, Henry, MD, 20 mg at 07/19/22 1701   sodium chloride flush (NS) 0.9 % injection 3 mL, 3 mL, Intravenous, Q12H,   Pool, Henry, MD, 3 mL at 07/20/22 1319   sodium chloride flush (NS) 0.9 % injection 3 mL, 3 mL, Intravenous, PRN, Pool, Henry, MD   sodium phosphate (FLEET) 7-19 GM/118ML enema 1 enema, 1 enema, Rectal, Once PRN, Pool, Henry, MD   traZODone (DESYREL) tablet 150 mg, 150 mg, Oral, QHS, Pool, Henry, MD, 150 mg at 07/19/22 2111   Patients Current Diet:  Diet Order                  Diet Carb Modified Fluid consistency: Thin  Diet effective now                         Precautions / Restrictions Precautions Precautions: Fall Precaution Comments: cervical precautions,drain, Cervical Brace: Soft collar Other Brace: can be off in bed, can be off for bathroom trips at night, Restrictions Weight Bearing Restrictions: No    Has the patient had 2 or more falls or a fall with injury in the past year? Yes   Prior Activity Level Community (5-7x/wk): Pt. active in the community PTA   Prior Functional Level Self Care: Did the patient need help bathing, dressing, using the toilet or eating? Independent   Indoor Mobility: Did the patient need assistance with walking from room to room (with or without device)? Independent   Stairs: Did the patient need assistance  with internal or external stairs (with or without device)? Independent   Functional Cognition: Did the patient need help planning regular tasks such as shopping or remembering to take medications? Independent   Patient Information Are you of Hispanic, Latino/a,or Spanish origin?: A. No, not of Hispanic, Latino/a, or Spanish origin What is your race?: A. White Do you need or want an interpreter to communicate with a doctor or health care staff?: 0. No   Patient's Response To:  Health Literacy and Transportation Is the patient able to respond to health literacy and transportation needs?: Yes Health Literacy - How often do you need to have someone help you when you read instructions, pamphlets, or other written material from your doctor or pharmacy?: Always In the past 12 months, has lack of transportation kept you from medical appointments or from getting medications?: No In the past 12 months, has lack of transportation kept you from meetings, work, or from getting things needed for daily living?: No   Home Assistive Devices / Equipment Home Equipment: Shower seat, Grab bars - tub/shower, Rolling Walker (2 wheels), Rollator (4 wheels)   Prior Device Use: Indicate devices/aids used by the patient prior to current illness, exacerbation or injury? None of the above   Current Functional Level Cognition   Overall Cognitive Status: Within Functional Limits for tasks assessed Orientation Level: Oriented X4 General Comments: patient was plesant and cooperative during session.,    Extremity Assessment (includes Sensation/Coordination)   Upper Extremity Assessment: Defer to OT evaluation RUE Deficits / Details: patient was WFL within cervical precautions patient currently has. MMT not fully assessed as per cervical preacutions. grip strength 4/5 LUE Deficits / Details: patient noted to have lagging L pinky with opening and closing of hand. patient reported numbness in hand but note forearm or  upper arm. patient noted to have 3/5 grip strength  Lower Extremity Assessment: Generalized weakness     ADLs   Overall ADL's : Needs assistance/impaired Eating/Feeding: Minimal assistance, Bed level Grooming: Sitting, Minimal assistance Grooming Details (indicate cue type and reason): patients session was limited by patients onset of soft   BP with patient reporting no dizziness but increased pain to 9/10 in neck with sitting EOB. Lower Body Bathing Details (indicate cue type and reason): patient is able to cross legs in bed with increased time. patient was educated on not bending over to complete these tasks. unable to reach feet or LEs effectively at bed level. plan to try sitting EOB to complete task when pain is under control. General ADL Comments: patient was educated on ROM restrictions for BUE, cervical precautions and importance of these rules. patient provided with handout in room.     Mobility   Overal bed mobility: Needs Assistance Bed Mobility: Rolling, Sidelying to Sit, Sit to Sidelying Rolling: Min assist Sidelying to sit: Min assist Supine to sit: Min assist Sit to supine: Min assist Sit to sidelying: Min assist General bed mobility comments: VC's for optimal log roll technique. Assist for full roll and trunk elevation to full sitting position. Increased time to scoot out to get feet on the floor.     Transfers   Overall transfer level: Needs assistance Equipment used: Rolling walker (2 wheels) Transfers: Sit to/from Stand Sit to Stand: Min assist General transfer comment: Assist for power up to full stand. VC's for hand placement on seated surface for safety.     Ambulation / Gait / Stairs / Wheelchair Mobility   Ambulation/Gait Ambulation/Gait assistance: Min assist Gait Distance (Feet): 3 Feet Assistive device: Rolling walker (2 wheels) Gait Pattern/deviations: Step-to pattern, Decreased stride length, Trunk flexed, Narrow base of support General Gait Details: Pt  took 3-4 side steps at EOB to transition further up towards HOB. Pt endorses dizziness and only able to tolerate lateral steps at this time. Gait velocity: Decreased Gait velocity interpretation: <1.31 ft/sec, indicative of household ambulator     Posture / Balance Dynamic Sitting Balance Sitting balance - Comments: sitting EOB with kyphotic posture. Balance Overall balance assessment: Needs assistance Sitting-balance support: Feet supported Sitting balance-Leahy Scale: Fair Sitting balance - Comments: sitting EOB with kyphotic posture. Standing balance support: During functional activity, Bilateral upper extremity supported, Reliant on assistive device for balance Standing balance-Leahy Scale: Poor     Special needs/care consideration Skin surgical incision  and Special service needs Cervical brace     Previous Home Environment (from acute therapy documentation) Living Arrangements: Children, Other relatives Available Help at Discharge: Family, Available 24 hours/day Type of Home: House Home Layout: One level Home Access: Stairs to enter Entrance Stairs-Rails: Can reach both Entrance Stairs-Number of Steps: 5 Bathroom Shower/Tub: Walk-in shower Additional Comments: patient reported plan is to go stay with daughter like she was prior to surgery. home info above reflects this choice.   Discharge Living Setting Plans for Discharge Living Setting: Patient's home Type of Home at Discharge: House Discharge Home Layout: One level Discharge Home Access: Stairs to enter Entrance Stairs-Rails: Can reach both Entrance Stairs-Number of Steps: 5 Discharge Bathroom Shower/Tub: Walk-in shower Discharge Bathroom Toilet: Standard Discharge Bathroom Accessibility: Yes How Accessible: Accessible via walker   Social/Family/Support Systems Patient Roles: Spouse, Other (Comment) Contact Information: [phone] Anticipated Caregiver: [name] Whiting Anticipated Caregiver's Contact Information:  Daughter can do 24/7 min mod A Ability/Limitations of Caregiver: 24/7 Caregiver Availability: 24/7 Discharge Plan Discussed with Primary Caregiver: Yes Is Caregiver In Agreement with Plan?: Yes Does Caregiver/Family have Issues with Lodging/Transportation while Pt is in Rehab?: No   Goals Patient/Family Goal for Rehab: PT/OT Mod I to supervision Expected length of stay: 7-10 days Pt/Family Agrees to Admission and willing to participate: Yes Program   Orientation Provided & Reviewed with Pt/Caregiver Including Roles  & Responsibilities: Yes   Decrease burden of Care through IP rehab admission: not anticipated   Possible need for SNF placement upon discharge: not anticipated  Patient Condition: This patient's condition remains as documented in the consult dated 07/20/22, in which the Rehabilitation Physician determined and documented that the patient's condition is appropriate for intensive rehabilitative care in an inpatient rehabilitation facility. Will admit to inpatient rehab today.  Preadmission Screen Completed By:  Javyn Havlin B Kerney Hopfensperger, CCC-SLP, 07/22/2022 10:17 AM ______________________________________________________________________   Discussed status with Dr. Engler on 07/22/22 at 930 and received approval for admission today.  Admission Coordinator:  Keelia Graybill B Shloma Roggenkamp, time 1018  /Date 07/22/22   

## 2022-07-22 NOTE — Progress Notes (Signed)
Inpatient Rehab Admissions Coordinator:    I have a CIR bed for this Pt. Today. RN may call report to [phone].  Pt. Is in agreement to d/c to to CIR for an estimated 7-10 days with the goal of leaving at supervision- modified independent level with daughter for support. Daughter, Toney Reil, confirms ability to provide this support.   Megan Salon, MS, CCC-SLP Rehab Admissions Coordinator  [phone] (celll) [phone] (office)

## 2022-07-22 NOTE — Progress Notes (Signed)
Physical Therapy Treatment Patient Details Name: Alexandria Webster MRN: [id number] DOB: 11/12/1965 Today's Date: 07/22/2022   History of Present Illness Patient is a 57 year old female who presented with progressively worsening bilateral UE numbness, paresthesias and weakness with increased gait instability. 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 On 5/7 patient underwent C5, C6, C7, T1 decompressive laminectomy with C4, C5, C6, C7 and T1 posterior lateral arthrodesis. PMH: COPD, DM, GERD, depression, DDD, asthma, PVD, HTN.    PT Comments    Pt greeted resting in bed and agreeable to session, however pt with continued slow progress towards acute goals. Pt able come to sitting EEOB and perform functional transfers with grossly min A as pt with increased dizziness this session despite pre-medication. Pt unable to progress gait due to dizziness. Continued education re; brace wear, appropriate activity progression and importance of time up OOB with pt verbalizing understanding. Pt continues to benefit from skilled PT services to progress toward functional mobility goals.    Recommendations for follow up therapy are one component of a multi-disciplinary discharge planning process, led by the attending physician.  Recommendations may be updated based on patient status, additional functional criteria and insurance authorization.  Follow Up Recommendations       Assistance Recommended at Discharge Frequent or constant Supervision/Assistance  Patient can return home with the following A little help with walking and/or transfers;A little help with bathing/dressing/bathroom;Assistance with cooking/housework;Assist for transportation;Help with stairs or ramp for entrance   Equipment Recommendations  None recommended by PT    Recommendations for Other Services       Precautions / Restrictions  Precautions Precautions: Fall;Cervical Precaution Comments: Pt was cued for precautions during functional mobility. Required Braces or Orthoses: Cervical Brace Cervical Brace: Soft collar Other Brace: can be off in bed, can be off for bathroom trips at night Restrictions Weight Bearing Restrictions: No     Mobility  Bed Mobility Overal bed mobility: Needs Assistance Bed Mobility: Sidelying to Sit, Sit to Sidelying Rolling: Min guard Sidelying to sit: Min guard       General bed mobility comments: Increased time and effort but pt able to transition to/from EOB without assistance. VC's for log roll technique throughout, assist to scoot out to EOB via bed pads    Transfers Overall transfer level: Needs assistance Equipment used: Rolling walker (2 wheels) Transfers: Sit to/from Stand, Bed to chair/wheelchair/BSC Sit to Stand: Min assist   Step pivot transfers: Min guard       General transfer comment: VC's for hand placement on seated surface for safety. Min assist for power up to full stand and to gain/maintain standing balance. min guard to step pivot to chair    Ambulation/Gait               General Gait Details: pt coming to stand from chair in hall for gait trial however pt unable to progress due to dizziness   Stairs             Wheelchair Mobility    Modified Rankin (Stroke Patients Only)       Balance Overall balance assessment: Needs assistance Sitting-balance support: Feet supported Sitting balance-Leahy Scale: Fair Sitting balance - Comments: sitting EOB with kyphotic posture.   Standing balance support: Single extremity supported, Bilateral upper extremity supported, During functional activity Standing balance-Leahy Scale: Poor Standing balance comment: Reliant on UE support  Cognition Arousal/Alertness: Awake/alert Behavior During Therapy: WFL for tasks assessed/performed Overall Cognitive Status:  Within Functional Limits for tasks assessed                                          Exercises      General Comments        Pertinent Vitals/Pain Pain Assessment Pain Assessment: Faces Faces Pain Scale: Hurts little more Pain Location: neck Pain Descriptors / Indicators: Grimacing, Guarding Pain Intervention(s): Monitored during session, Limited activity within patient's tolerance, Repositioned    Home Living                          Prior Function            PT Goals (current goals can now be found in the care plan section) Acute Rehab PT Goals Patient Stated Goal: None stated PT Goal Formulation: With patient Time For Goal Achievement: 07/27/22 Progress towards PT goals: Not progressing toward goals - comment (dizziness this session)    Frequency    Min 5X/week      PT Plan Current plan remains appropriate    Co-evaluation              AM-PAC PT "6 Clicks" Mobility   Outcome Measure  Help needed turning from your back to your side while in a flat bed without using bedrails?: A Little Help needed moving from lying on your back to sitting on the side of a flat bed without using bedrails?: A Little Help needed moving to and from a bed to a chair (including a wheelchair)?: A Little Help needed standing up from a chair using your arms (e.g., wheelchair or bedside chair)?: A Little Help needed to walk in hospital room?: A Little Help needed climbing 3-5 steps with a railing? : Total 6 Click Score: 16    End of Session Equipment Utilized During Treatment: Gait belt;Cervical collar Activity Tolerance: Patient tolerated treatment well;Other (comment) (limited by dizziness) Patient left: with call bell/phone within reach;in chair Nurse Communication: Mobility status PT Visit Diagnosis: Unsteadiness on feet (R26.81);Pain Pain - part of body:  (neck)     Time: 8119-1478 PT Time Calculation (min) (ACUTE ONLY): 23 min  Charges:   $Therapeutic Activity: 23-37 mins                    Glynn Freas R. PTA Acute Rehabilitation Services Office: [phone]   Catalina Antigua 07/22/2022, 10:40 AM

## 2022-07-22 NOTE — Progress Notes (Signed)
Patient to transfer to 4MW10. Report given to Receiving RN. Patient in no signs of distress at this time.

## 2022-07-22 NOTE — PMR Pre-admission (Addendum)
PMR Admission Coordinator Pre-Admission Assessment   Patient: Alexandria Webster is an 57 y.o., female MRN: [id number] DOB: [birth year] Height: 5\' 4"  (162.6 cm) Weight: 55.7 kg   Insurance Information HMO: yes    PPO:      PCP:      IPA:      80/20:      OTHER:  PRIMARY: [employer] [facility]      Policy#: [id number]      Subscriber: pt CM Name: Andrey Campanile       Phone#: [phone]   Fax#: [phone] Pre-Cert#: [id number]       Employer: Andrey Campanile with [employer] called 5/10 with approval for admit 07/22/22-08/03/22 with updates due 08/03/22 Benefits:  Phone #:      Name:  Dolores Hoose Date: 03/14/2022- still active Deductible: does not have one OOP Max: $1,800 ($848.71 met) CIR: 75% coverage, 25% co-insurance SNF: 75% coverage, 25% co-insurance; limited to 90 days/cal (90 remaining) Outpatient:  100% coverage; limited to 30 visits/cal yr (30 remaining) Home Health:  $10 copay/visit DME: 50% coverage; 50% co-insurance Providers: in network    SECONDARY:       Policy#:      Phone#:    Artist:       Phone#:    The Data processing manager" for patients in Inpatient Rehabilitation Facilities with attached "Privacy Act Statement-Health Care Records" was provided and verbally reviewed with: n/a    Emergency Contact Information Contact Information       Name Relation Home Work Mobile    [place] Daughter [phone]   [phone]           Current Medical History  Patient Admitting Diagnosis: Cervical Myelopathy  History of Present Illness: Alexandria Webster is a 57 year old right-handed female with history of diabetes mellitus, hypertension, hyperlipidemia, COPD who quit smoking 4 years ago.  Per chart review patient lives with husband and daughter.  1 level home 5 steps to entry.  Daughter was assisting with ADLs.  Presented 07/19/2022 with bilateral upper extremity numbness and paresthesias as well as weakness with increasing gait instability and frequent falls over  the past year.  Initially patient stated her right side was weaker however more recently her left side has become more affected.  CT/MRI of the brain showed no acute intracranial abnormality there was a small remote lacunar infarct about the thalami and cerebellum.  MRI of the cervical spine showed multilevel cervical spondylosis with resultant diffuse spinal stenosis, severe in nature at C5-6 and C6-7.  Patchy cord signal changes at these levels consistent with myelomalacia.  Question additional subtle cord signal abnormality at the level of C3-4.  Patient underwent C5-6-C7, T1 decompressive laminotomy with C4-5-6-C7 and T1 posterior lateral arthrodesis 07/19/2022 per Dr. Jordan Likes.  Placed in a cervical soft collar that can be off when in bed.  Blood pressures have been soft and monitored when up with therapies.  Therapy evaluations completed due to patient's decreased functional mobility was admitted for a comprehensive rehab program.    Patient's medical record from [facility]  has been reviewed by the rehabilitation admission coordinator and physician.   Past Medical History      Past Medical History:  Diagnosis Date   Asthma     COPD (chronic obstructive pulmonary disease) (HCC)     DDD (degenerative disc disease), lumbar     Depression     Diabetes mellitus     Gastroparesis     GERD without esophagitis  High cholesterol     Hypertension     Hypothyroidism     PONV (postoperative nausea and vomiting)     PVD (peripheral vascular disease) (HCC)     Wears glasses        Has the patient had major surgery during 100 days prior to admission? Yes   Family History   family history includes Asthma in her father; Diabetes in her father; Emphysema in her paternal grandfather; Heart disease in her father; Heart failure in her father; Hypertension in her mother; Stroke in her mother.   Current Medications   Current Facility-Administered Medications:    0.9 %  sodium chloride  infusion, 250 mL, Intravenous, Continuous, Pool, Henry, MD   acetaminophen (TYLENOL) tablet 650 mg, 650 mg, Oral, Q4H PRN **OR** acetaminophen (TYLENOL) suppository 650 mg, 650 mg, Rectal, Q4H PRN, Julio Sicks, MD   albuterol (PROVENTIL) tablet 2 mg, 2 mg, Oral, Daily, Pool, Sherilyn Cooter, MD, 2 mg at 07/20/22 0830   albuterol (VENTOLIN HFA) 108 (90 Base) MCG/ACT inhaler 1-2 puff, 1-2 puff, Inhalation, Q4H PRN, Julio Sicks, MD   aspirin tablet 325 mg, 325 mg, Oral, Daily, Pool, Sherilyn Cooter, MD, 325 mg at 07/20/22 0831   bisacodyl (DULCOLAX) suppository 10 mg, 10 mg, Rectal, Daily PRN, Julio Sicks, MD   diazepam (VALIUM) tablet 5-10 mg, 5-10 mg, Oral, Q6H PRN, Julio Sicks, MD, 5 mg at 07/20/22 0350   docusate sodium (COLACE) capsule 100 mg, 100 mg, Oral, Daily, Pool, Sherilyn Cooter, MD, 100 mg at 07/20/22 0831   furosemide (LASIX) tablet 20 mg, 20 mg, Oral, Daily, Pool, Sherilyn Cooter, MD, 20 mg at 07/19/22 1701   HYDROcodone-acetaminophen (NORCO) 10-325 MG per tablet 1 tablet, 1 tablet, Oral, Q4H PRN, Julio Sicks, MD, 1 tablet at 07/19/22 1703   HYDROmorphone (DILAUDID) injection 1 mg, 1 mg, Intravenous, Q2H PRN, Julio Sicks, MD   insulin aspart (novoLOG) injection 0-15 Units, 0-15 Units, Subcutaneous, TID WC, Julio Sicks, MD, 2 Units at 07/20/22 1318   insulin aspart (novoLOG) injection 0-5 Units, 0-5 Units, Subcutaneous, QHS, Pool, Sherilyn Cooter, MD, 3 Units at 07/19/22 2113   levothyroxine (SYNTHROID) tablet 50 mcg, 50 mcg, Oral, QAC breakfast, Julio Sicks, MD, 50 mcg at 07/20/22 0544   lisinopril (ZESTRIL) tablet 5 mg, 5 mg, Oral, Daily, Pool, Sherilyn Cooter, MD, 5 mg at 07/19/22 1701   loratadine (CLARITIN) tablet 10 mg, 10 mg, Oral, Daily, Pool, Sherilyn Cooter, MD, 10 mg at 07/20/22 0831   meclizine (ANTIVERT) tablet 25 mg, 25 mg, Oral, TID PRN, Julio Sicks, MD, 25 mg at 07/20/22 1318   menthol-cetylpyridinium (CEPACOL) lozenge 3 mg, 1 lozenge, Oral, PRN **OR** phenol (CHLORASEPTIC) mouth spray 1 spray, 1 spray, Mouth/Throat, PRN, Pool, Sherilyn Cooter, MD    ondansetron (ZOFRAN) tablet 4 mg, 4 mg, Oral, Q6H PRN **OR** ondansetron (ZOFRAN) injection 4 mg, 4 mg, Intravenous, Q6H PRN, Pool, Sherilyn Cooter, MD   oxyCODONE (Oxy IR/ROXICODONE) immediate release tablet 10 mg, 10 mg, Oral, Q3H PRN, Julio Sicks, MD, 10 mg at 07/20/22 1503   pantoprazole (PROTONIX) EC tablet 40 mg, 40 mg, Oral, Daily, Pool, Sherilyn Cooter, MD, 40 mg at 07/20/22 0830   polyethylene glycol (MIRALAX / GLYCOLAX) packet 17 g, 17 g, Oral, Daily PRN, Pool, Sherilyn Cooter, MD   polyethylene glycol powder (GLYCOLAX/MIRALAX) container 17 g, 17 g, Oral, Daily PRN, Julio Sicks, MD   pravastatin (PRAVACHOL) tablet 20 mg, 20 mg, Oral, q1800, Pool, Sherilyn Cooter, MD, 20 mg at 07/19/22 1701   sodium chloride flush (NS) 0.9 % injection 3 mL, 3 mL, Intravenous, Q12H,  Julio Sicks, MD, 3 mL at 07/20/22 1319   sodium chloride flush (NS) 0.9 % injection 3 mL, 3 mL, Intravenous, PRN, Julio Sicks, MD   sodium phosphate (FLEET) 7-19 GM/118ML enema 1 enema, 1 enema, Rectal, Once PRN, Julio Sicks, MD   traZODone (DESYREL) tablet 150 mg, 150 mg, Oral, QHS, Julio Sicks, MD, 150 mg at 07/19/22 2111   Patients Current Diet:  Diet Order                  Diet Carb Modified Fluid consistency: Thin  Diet effective now                         Precautions / Restrictions Precautions Precautions: Fall Precaution Comments: cervical precautions,drain, Cervical Brace: Soft collar Other Brace: can be off in bed, can be off for bathroom trips at night, Restrictions Weight Bearing Restrictions: No    Has the patient had 2 or more falls or a fall with injury in the past year? Yes   Prior Activity Level Community (5-7x/wk): Pt. active in the community PTA   Prior Functional Level Self Care: Did the patient need help bathing, dressing, using the toilet or eating? Independent   Indoor Mobility: Did the patient need assistance with walking from room to room (with or without device)? Independent   Stairs: Did the patient need assistance  with internal or external stairs (with or without device)? Independent   Functional Cognition: Did the patient need help planning regular tasks such as shopping or remembering to take medications? Independent   Patient Information Are you of Hispanic, Latino/a,or Spanish origin?: A. No, not of Hispanic, Latino/a, or Spanish origin What is your race?: A. White Do you need or want an interpreter to communicate with a doctor or health care staff?: 0. No   Patient's Response To:  Health Literacy and Transportation Is the patient able to respond to health literacy and transportation needs?: Yes Health Literacy - How often do you need to have someone help you when you read instructions, pamphlets, or other written material from your doctor or pharmacy?: Always In the past 12 months, has lack of transportation kept you from medical appointments or from getting medications?: No In the past 12 months, has lack of transportation kept you from meetings, work, or from getting things needed for daily living?: No   Journalist, newspaper / Equipment Home Equipment: Shower seat, Grab bars - tub/shower, Agricultural consultant (2 wheels), Rollator (4 wheels)   Prior Device Use: Indicate devices/aids used by the patient prior to current illness, exacerbation or injury? None of the above   Current Functional Level Cognition   Overall Cognitive Status: Within Functional Limits for tasks assessed Orientation Level: Oriented X4 General Comments: patient was plesant and cooperative during session.,    Extremity Assessment (includes Sensation/Coordination)   Upper Extremity Assessment: Defer to OT evaluation RUE Deficits / Details: patient was [facility] within cervical precautions patient currently has. MMT not fully assessed as per cervical preacutions. grip strength 4/5 LUE Deficits / Details: patient noted to have lagging L pinky with opening and closing of hand. patient reported numbness in hand but note forearm or  upper arm. patient noted to have 3/5 grip strength  Lower Extremity Assessment: Generalized weakness     ADLs   Overall ADL's : Needs assistance/impaired Eating/Feeding: Minimal assistance, Bed level Grooming: Sitting, Minimal assistance Grooming Details (indicate cue type and reason): patients session was limited by patients onset of soft  BP with patient reporting no dizziness but increased pain to 9/10 in neck with sitting EOB. Lower Body Bathing Details (indicate cue type and reason): patient is able to cross legs in bed with increased time. patient was educated on not bending over to complete these tasks. unable to reach feet or LEs effectively at bed level. plan to try sitting EOB to complete task when pain is under control. General ADL Comments: patient was educated on ROM restrictions for BUE, cervical precautions and importance of these rules. patient provided with handout in room.     Mobility   Overal bed mobility: Needs Assistance Bed Mobility: Rolling, Sidelying to Sit, Sit to Sidelying Rolling: Min assist Sidelying to sit: Min assist Supine to sit: Min assist Sit to supine: Min assist Sit to sidelying: Min assist General bed mobility comments: VC's for optimal log roll technique. Assist for full roll and trunk elevation to full sitting position. Increased time to scoot out to get feet on the floor.     Transfers   Overall transfer level: Needs assistance Equipment used: Rolling walker (2 wheels) Transfers: Sit to/from Stand Sit to Stand: Min assist General transfer comment: Assist for power up to full stand. VC's for hand placement on seated surface for safety.     Ambulation / Gait / Stairs / Wheelchair Mobility   Ambulation/Gait Ambulation/Gait assistance: Editor, commissioning (Feet): 3 Feet Assistive device: Rolling walker (2 wheels) Gait Pattern/deviations: Step-to pattern, Decreased stride length, Trunk flexed, Narrow base of support General Gait Details: Pt  took 3-4 side steps at EOB to transition further up towards HOB. Pt endorses dizziness and only able to tolerate lateral steps at this time. Gait velocity: Decreased Gait velocity interpretation: <1.31 ft/sec, indicative of household ambulator     Posture / Balance Dynamic Sitting Balance Sitting balance - Comments: sitting EOB with kyphotic posture. Balance Overall balance assessment: Needs assistance Sitting-balance support: Feet supported Sitting balance-Leahy Scale: Fair Sitting balance - Comments: sitting EOB with kyphotic posture. Standing balance support: During functional activity, Bilateral upper extremity supported, Reliant on assistive device for balance Standing balance-Leahy Scale: Poor     Special needs/care consideration Skin surgical incision  and Special service needs Cervical brace     Previous Home Environment (from acute therapy documentation) Living Arrangements: Children, Other relatives Available Help at Discharge: Family, Available 24 hours/day Type of Home: House Home Layout: One level Home Access: Stairs to enter Entrance Stairs-Rails: Can reach both Entrance Stairs-Number of Steps: 5 Bathroom Shower/Tub: Walk-in shower Additional Comments: patient reported plan is to go stay with daughter like she was prior to surgery. home info above reflects this choice.   Discharge Living Setting Plans for Discharge Living Setting: Patient's home Type of Home at Discharge: House Discharge Home Layout: One level Discharge Home Access: Stairs to enter Entrance Stairs-Rails: Can reach both Entrance Stairs-Number of Steps: 5 Discharge Bathroom Shower/Tub: Walk-in shower Discharge Bathroom Toilet: Standard Discharge Bathroom Accessibility: Yes How Accessible: Accessible via walker   Social/Family/Support Systems Patient Roles: Spouse, Other (Comment) Contact Information: [phone] Anticipated Caregiver: Elsie Amis Anticipated Caregiver's Contact Information:  Daughter can do 24/7 min mod A Ability/Limitations of Caregiver: 24/7 Caregiver Availability: 24/7 Discharge Plan Discussed with Primary Caregiver: Yes Is Caregiver In Agreement with Plan?: Yes Does Caregiver/Family have Issues with Lodging/Transportation while Pt is in Rehab?: No   Goals Patient/Family Goal for Rehab: PT/OT Mod I to supervision Expected length of stay: 7-10 days Pt/Family Agrees to Admission and willing to participate: Yes Program  Orientation Provided & Reviewed with Pt/Caregiver Including Roles  & Responsibilities: Yes   Decrease burden of Care through IP rehab admission: not anticipated   Possible need for SNF placement upon discharge: not anticipated  Patient Condition: This patient's condition remains as documented in the consult dated 07/20/22, in which the Rehabilitation Physician determined and documented that the patient's condition is appropriate for intensive rehabilitative care in an inpatient rehabilitation facility. Will admit to inpatient rehab today.  Preadmission Screen Completed By:  Jeronimo Greaves, CCC-SLP, 07/22/2022 10:17 AM ______________________________________________________________________   Discussed status with Dr. Shearon Stalls on 07/22/22 at 930 and received approval for admission today.  Admission Coordinator:  Jeronimo Greaves, time 1018  Dorna Bloom 07/22/22

## 2022-07-22 NOTE — Progress Notes (Signed)
Arrived to IP Rehab today from [facility]. Admission/Assessments complete. Skin assessment performed with Charge RN. Notified Dan A. PA.  Of patient's blood pressure. POC in progress.     Tilden Dome, LPN

## 2022-07-22 NOTE — H&P (Signed)
Expand All Collapse All      Physical Medicine and Rehabilitation Admission H&P       HPI: Alexandria Webster is a 57 year old right-handed female with history of diabetes mellitus, hypertension, hyperlipidemia, COPD who quit smoking 4 years ago.  Per chart review patient lives with husband and daughter.  1 level home 5 steps to entry.  Daughter was assisting with ADLs.  Presented 07/19/2022 with bilateral upper extremity numbness and paresthesias as well as weakness with increasing gait instability and frequent falls over the past year.  Initially patient stated her right side was weaker however more recently her left side has become more affected.  CT/MRI of the brain showed no acute intracranial abnormality there was a small remote lacunar infarct about the thalami and cerebellum.  MRI of the cervical spine showed multilevel cervical spondylosis with resultant diffuse spinal stenosis, severe in nature at C5-6 and C6-7.  Patchy cord signal changes at these levels consistent with myelomalacia.  Question additional subtle cord signal abnormality at the level of C3-4.  Patient underwent C5-6-C7, T1 decompressive laminotomy with C4-5-6-C7 and T1 posterior lateral arthrodesis 07/19/2022 per Dr. Jordan Likes.  Placed in a cervical soft collar that can be off when in bed.  Blood pressures have been soft and monitored when up with therapies.  Therapy evaluations completed due to patient's decreased functional mobility was admitted for a comprehensive rehab program.   Review of Systems  Constitutional:  Negative for chills and fever.  HENT:  Negative for hearing loss.   Eyes:  Negative for blurred vision and double vision.  Respiratory:  Negative for cough and wheezing.        Shortness of breath with heavy exertion  Cardiovascular:  Positive for leg swelling. Negative for chest pain and palpitations.  Gastrointestinal:  Positive for constipation. Negative for heartburn, nausea and vomiting.       GERD   Genitourinary:  Negative for dysuria, flank pain and hematuria.  Musculoskeletal:  Positive for joint pain, myalgias and neck pain.  Skin:  Negative for rash.  Neurological:  Positive for dizziness and weakness.  Psychiatric/Behavioral:  Positive for depression. The patient has insomnia.   All other systems reviewed and are negative.       Past Medical History:  Diagnosis Date   Asthma     COPD (chronic obstructive pulmonary disease) (HCC)     DDD (degenerative disc disease), lumbar     Depression     Diabetes mellitus     Gastroparesis     GERD without esophagitis     High cholesterol     Hypertension     Hypothyroidism     PONV (postoperative nausea and vomiting)     PVD (peripheral vascular disease) (HCC)     Wears glasses           Past Surgical History:  Procedure Laterality Date   AORTA - BILATERAL FEMORAL ARTERY BYPASS GRAFT N/A 09/27/2018    Procedure: Redo Exposure  AORTOBIFEMORAL Bypass and bilateral femoral Artery ; Redo Aortobifemoral  BYPASS GRAFT.;  Surgeon: Cephus Shelling, MD;  Location: [facility] OR;  Service: Vascular;  Laterality: N/A;   BACK SURGERY       BIOPSY   10/14/2021    Procedure: BIOPSY;  Surgeon: Lanelle Bal, DO;  Location: [facility];  Service: Endoscopy;;   COLONOSCOPY WITH PROPOFOL N/A 10/14/2021    Procedure: COLONOSCOPY WITH PROPOFOL;  Surgeon: Lanelle Bal, DO;  Location: [facility];  Service: Endoscopy;  Laterality: N/A;  8:30am   ESOPHAGOGASTRODUODENOSCOPY (EGD) WITH PROPOFOL N/A 10/14/2021    Procedure: ESOPHAGOGASTRODUODENOSCOPY (EGD) WITH PROPOFOL;  Surgeon: Lanelle Bal, DO;  Location: [facility];  Service: Endoscopy;  Laterality: N/A;   FEMORAL ARTERY STENT   right leg   POSTERIOR CERVICAL FUSION/FORAMINOTOMY N/A 07/19/2022    Procedure: Posterior cervical fusion with lateral mass fixation - Cervical four-cervical five, Cervical five-Cervical six - Cervical six-Cervical seven - Cervical seven-Thoracic one with  laminectomy;  Surgeon: Julio Sicks, MD;  Location: [facility] OR;  Service: Neurosurgery;  Laterality: N/A;   TUBAL LIGATION             Family History  Problem Relation Age of Onset   Stroke Mother     Hypertension Mother     Heart failure Father     Asthma Father     Diabetes Father     Heart disease Father     Emphysema Paternal Grandfather     Colon cancer Neg Hx     Colon polyps Neg Hx      Social History:  reports that she quit smoking about 4 years ago. Her smoking use included cigarettes. She has been exposed to tobacco smoke. Her smokeless tobacco use includes snuff. She reports that she does not drink alcohol and does not use drugs. Allergies:      Allergies  Allergen Reactions   Metformin And Related Nausea And Vomiting          Medications Prior to Admission  Medication Sig Dispense Refill   albuterol (PROVENTIL) 2 MG tablet Take 2 mg by mouth daily.       albuterol (VENTOLIN HFA) 108 (90 Base) MCG/ACT inhaler Inhale 1-2 puffs into the lungs every 4 (four) hours as needed for wheezing or shortness of breath.       aspirin 325 MG tablet Take 325 mg by mouth daily.       cetirizine (ZYRTEC) 10 MG tablet Take 10 mg by mouth daily.       docusate sodium (COLACE) 100 MG capsule Take 100 mg by mouth daily.       esomeprazole (NEXIUM) 40 MG capsule Take 1 capsule (40 mg total) by mouth 2 (two) times daily before a meal. (Patient taking differently: Take 40 mg by mouth daily.) 30 capsule 4   furosemide (LASIX) 20 MG tablet Take 1 tablet (20 mg total) by mouth every other day. (Patient taking differently: Take 20 mg by mouth daily.)   0   levothyroxine (SYNTHROID) 50 MCG tablet Take 50 mcg by mouth daily before breakfast.       lisinopril (ZESTRIL) 5 MG tablet Take 5 mg by mouth daily.       lovastatin (MEVACOR) 20 MG tablet Take 20 mg by mouth at bedtime.       meclizine (ANTIVERT) 25 MG tablet Take 25 mg by mouth 3 (three) times daily as needed for dizziness.       meloxicam (MOBIC)  7.5 MG tablet Take 1 tablet (7.5 mg total) by mouth daily as needed for pain. (Patient taking differently: Take 7.5 mg by mouth 2 (two) times daily as needed for pain.)       polyethylene glycol powder (MIRALAX) 17 GM/SCOOP powder Take 17 g by mouth daily as needed for moderate constipation.       traZODone (DESYREL) 150 MG tablet Take 150 mg by mouth at bedtime.       glucose blood (ACCU-CHEK AVIVA) test strip Test glucose 4  times a day 150 each 3   Insulin Pen Needle (B-D UF III MINI PEN NEEDLES) 31G X 5 MM MISC Use qhs 100 each 5   predniSONE (STERAPRED UNI-PAK 21 TAB) 10 MG (21) TBPK tablet Take by mouth daily. Take 6 tabs by mouth daily  for 2 days, then 5 tabs for 2 days, then 4 tabs for 2 days, then 3 tabs for 2 days, 2 tabs for 2 days, then 1 tab by mouth daily for 2 days (Patient not taking: Reported on 07/12/2022) 42 tablet 0          Home: Home Living Family/patient expects to be discharged to:: Private residence Living Arrangements: Children, Other relatives Available Help at Discharge: Family, Available 24 hours/day Type of Home: House Home Access: Stairs to enter [employer] of Steps: 5 Entrance Stairs-Rails: Can reach both Home Layout: One level Bathroom Shower/Tub: Artist: Information systems manager, Grab bars - tub/shower, Agricultural consultant (2 wheels), Rollator (4 wheels) Additional Comments: patient reported plan is to go stay with daughter like she was prior to surgery. home info above reflects this choice.   Functional History: Prior Function Prior Level of Function : Independent/Modified Independent Mobility Comments: Reports PLOF no AD, but husband and daughter do grocery shopping ADLs Comments: patient reported daughter was assisting with toileting hygiene, bathing, dressing but able to walk with walker and transfer herself per patient report.   Functional Status:  Mobility: Bed Mobility Overal bed mobility: Needs Assistance Bed Mobility:  Sidelying to Sit, Sit to Sidelying Rolling: Min assist Sidelying to sit: Min guard Supine to sit: Min assist Sit to supine: Min assist Sit to sidelying: Min guard General bed mobility comments: Increased time and effort but pt able to transition to/from EOB without assistance. VC's for log roll technique throughout Transfers Overall transfer level: Needs assistance Equipment used: Rolling walker (2 wheels) Transfers: Sit to/from Stand Sit to Stand: Min assist Bed to/from chair/wheelchair/BSC transfer type:: Step pivot Step pivot transfers: Min assist General transfer comment: VC's for hand placement on seated surface for safety. Min assist for power up to full stand and to gain/maintain standing balance. Ambulation/Gait Ambulation/Gait assistance: Min assist, +2 safety/equipment Gait Distance (Feet): 15 Feet (x3 bouts) Assistive device: Rolling walker (2 wheels) Gait Pattern/deviations: Decreased stride length, Trunk flexed, Narrow base of support, Step-through pattern General Gait Details: Pt with extreme cervical and trunk flexion but able to briefly improve with multimodal cues. Pt ambulated ~15' x3 bouts with seated rest break in between each trial. Assist for balance support and safety. Chair follow utilized. Gait velocity: Decreased Gait velocity interpretation: <1.31 ft/sec, indicative of household ambulator   ADL: ADL Overall ADL's : Needs assistance/impaired Eating/Feeding: Minimal assistance, Bed level Grooming: Wash/dry hands, Wash/dry face, Brushing hair, Minimal assistance, Sitting Grooming Details (indicate cue type and reason): assistance with combing hair Lower Body Bathing Details (indicate cue type and reason): patient is able to cross legs in bed with increased time. patient was educated on not bending over to complete these tasks. unable to reach feet or LEs effectively at bed level. plan to try sitting EOB to complete task when pain is under control. Lower Body  Dressing: Moderate assistance, With adaptive equipment, Cueing for sequencing Lower Body Dressing Details (indicate cue type and reason): education on reacher use for LB dressing with assistance to thread legs into clothing with reacher and to mod assist to pull up clothing General ADL Comments: education on AE use for LB dressing could benefit from  further training   Cognition: Cognition Overall Cognitive Status: Within Functional Limits for tasks assessed Orientation Level: Oriented X4 Cognition Arousal/Alertness: Awake/alert Behavior During Therapy: WFL for tasks assessed/performed Overall Cognitive Status: Within Functional Limits for tasks assessed General Comments: believed it was Tuesday and 2004, reviewed cervical precautions with patient recalling 0/3   Physical Exam: Blood pressure (!) 107/56, pulse 85, temperature 99.3 F (37.4 C), temperature source Oral, resp. rate 18, height 5\' 4"  (1.626 m), weight 55.7 kg, last menstrual period 01/21/2012, SpO2 94 %. Physical Exam Constitutional: No apparent distress. Appropriate appearance for age.  HENT: No JVD. Neck Supple. Trachea midline. Atraumatic, normocephalic. + soft cervical collar Eyes: PERRLA. EOMI. Visual fields grossly intact.  Cardiovascular: RRR, no murmurs/rub/gallops. No Edema. Peripheral pulses 2+  Respiratory: CTAB. No rales, rhonchi, or wheezing. On RA.  Abdomen: + bowel sounds, normoactive. No distention or tenderness.  Skin: C/D/I. No apparent lesions.  Posterior neck surgical site covered in clean honeycomb dressing. MSK:      No apparent deformity.      Strength:                RUE: 4+ to 5-/5 throughout                LUE: 4/5 SA, 4-/5 EF, 4-/5 EE, 3+/5 WE, 3/5 FF, 3/5 FA                 RLE: 4 out of 5 throughout                LLE: 4 of 5 throughout  Neurologic exam:  Cognition: AAO to person, place, time and event.  Language: Fluent, No substitutions or neoglisms. No dysarthria. Names 3/3 objects  correctly.  Memory: Recalls 3/3 objects at 5 minutes. No apparent deficits  Insight: Good insight into current condition.  Mood: Pleasant affect, appropriate mood.  Sensation: To light touch intact in BL UEs and LEs  Reflexes: Hyporeflexic in bilateral upper extremities and right lower extremity; hyperreflexic at left patella. Negative Hoffman's and babinski signs bilaterally.  CN: 2-12 grossly intact.  Coordination: Fine motor coordination impaired and ataxia in left upper extremity Spasticity: MAS 0 in all extremities.       Lab Results Last 48 Hours        Results for orders placed or performed during the hospital encounter of 07/19/22 (from the past 48 hour(s))  Glucose, capillary     Status: Abnormal    Collection Time: 07/20/22  6:25 AM  Result Value Ref Range    Glucose-Capillary 156 (H) 70 - 99 mg/dL      Comment: Glucose reference range applies only to samples taken after fasting for at least 8 hours.    Comment 1 Notify RN      Comment 2 Document in Chart    Glucose, capillary     Status: Abnormal    Collection Time: 07/20/22 11:47 AM  Result Value Ref Range    Glucose-Capillary 124 (H) 70 - 99 mg/dL      Comment: Glucose reference range applies only to samples taken after fasting for at least 8 hours.  Glucose, capillary     Status: Abnormal    Collection Time: 07/20/22  4:31 PM  Result Value Ref Range    Glucose-Capillary 164 (H) 70 - 99 mg/dL      Comment: Glucose reference range applies only to samples taken after fasting for at least 8 hours.    Comment 1 Notify RN  Comment 2 Document in Chart    Glucose, capillary     Status: None    Collection Time: 07/20/22  9:10 PM  Result Value Ref Range    Glucose-Capillary 79 70 - 99 mg/dL      Comment: Glucose reference range applies only to samples taken after fasting for at least 8 hours.  Glucose, capillary     Status: Abnormal    Collection Time: 07/21/22  6:32 AM  Result Value Ref Range    Glucose-Capillary  176 (H) 70 - 99 mg/dL      Comment: Glucose reference range applies only to samples taken after fasting for at least 8 hours.  Glucose, capillary     Status: Abnormal    Collection Time: 07/21/22  9:08 PM  Result Value Ref Range    Glucose-Capillary 204 (H) 70 - 99 mg/dL      Comment: Glucose reference range applies only to samples taken after fasting for at least 8 hours.    Comment 1 Notify RN      Comment 2 Document in Chart        Imaging Results (Last 48 hours)  No results found.         Blood pressure (!) 107/56, pulse 85, temperature 99.3 F (37.4 C), temperature source Oral, resp. rate 18, height 5\' 4"  (1.626 m), weight 55.7 kg, last menstrual period 01/21/2012, SpO2 94 %.   Medical Problem List and Plan: 1. Functional deficits secondary to cervical myelopathy s/p C5, 6, 7, T1 decompressive laminotomy with P CDF 07/19/2022 per Dr. Dutch Quint.               - Soft cervical collar when out of bed; can be offered bathroom trips at night.             -patient may shower with surgical site covered             -ELOS/Goals: 7 to 10 days, PT/OT mod I to supervision level 2.  Antithrombotics: -DVT/anticoagulation:  Mechanical:  Antiembolism stockings, knee (TED hose) Bilateral lower extremities.  Check vascular study             -antiplatelet therapy: Aspirin 325 mg daily 3. Pain Management: Hydrocodone as needed for pain, Valium as needed for muscle spasms 4. Mood/Behavior/Sleep: Trazodone 150 mg nightly.  Provide emotional support             -antipsychotic agents: N/A 5. Neuropsych/cognition: This patient is capable of making decisions on her own behalf. 6. Skin/Wound Care: Routine skin checks 7. Fluids/Electrolytes/Nutrition: Routine in and outs with follow-up chemistries 8.  COPD/quit smoking 4 years ago.  Continue albuterol as directed 9.  Hyperlipidemia.  Pravachol 10.  Hypertension.  Lisinopril 5 mg daily, Lasix 20 mg daily         --Blood pressure soft 80s over 60s on admission;  appears to have been intermittently hypotensive throughout her stay.  Receiving Lasix approximately every other day.  Will DC for now, start daily weights.  11.  Diabetes mellitus.  Latest hemoglobin A1c 9.3.  Currently SSI. 12.  Hypothyroidism.  Synthroid 13.  Constipation.  Colace 100 mg daily, Dulcolax suppository daily as needed, MiraLAX daily as needed   Charlton Amor, PA-C 07/22/2022     I have examined the patient independently and edited the note for HPI, ROS, exam, assessment, and plan as appropriate. I am in agreement with the above recommendations.   Angelina Sheriff, DO 07/22/2022

## 2022-07-22 NOTE — Inpatient Diabetes Management (Signed)
Inpatient Diabetes Program Recommendations  AACE/ADA: New Consensus Statement on Inpatient Glycemic Control (2015)  Target Ranges:  Prepandial:   less than 140 mg/dL      Peak postprandial:   less than 180 mg/dL (1-2 hours)      Critically ill patients:  140 - 180 mg/dL   Lab Results  Component Value Date   GLUCAP 144 (H) 07/22/2022   HGBA1C 9.3 (H) 07/14/2022    Review of Glycemic Control  Latest Reference Range & Units 07/21/22 06:32 07/21/22 12:44 07/21/22 15:56 07/21/22 21:08 07/22/22 06:08  Glucose-Capillary 70 - 99 mg/dL 295 (H) 621 (H) 308 (H) 204 (H) 144 (H)   Diabetes history: DM 2 Outpatient Diabetes medications: recently started back on Trulicity and Amaryl Current orders for Inpatient glycemic control:  Novolog 0-15 units tid + hs  A1c 9.3% on 5/2 PAT appt  Inpatient Diabetes Program Recommendations:    Note: Glucose trends increase after PO intake.  -  Novolog 2 units tid meal coverage if eating > 50% of meals. -  Reduce Novolog Correction to 0-9 units tid + hs scale  Thanks,  Christena Deem RN, MSN, BC-ADM Inpatient Diabetes Coordinator Team Pager [phone] (8a-5p)

## 2022-07-22 NOTE — Progress Notes (Signed)
Inpatient Rehabilitation Admission Medication Review by a Pharmacist  A complete drug regimen review was completed for this patient to identify any potential clinically significant medication issues.  High Risk Drug Classes Is patient taking? Indication by Medication  Antipsychotic No   Anticoagulant No   Antibiotic No   Opioid Yes Vicodin - pain  Antiplatelet Yes ASA - PVD  Hypoglycemics/insulin Yes SSI - DM  Vasoactive Medication Yes Lasix/lisinopril - HTN  Chemotherapy No   Other Yes Valium - spasms Albuterol - COPD Synthroid - hypothyroidism Claritin - allergy Protonix - GERD Pravastatin - HLD Trazodone - sleep     Type of Medication Issue Identified Description of Issue Recommendation(s)  Drug Interaction(s) (clinically significant)     Duplicate Therapy     Allergy     No Medication Administration End Date     Incorrect Dose     Additional Drug Therapy Needed     Significant med changes from prior encounter (inform family/care partners about these prior to discharge).    Other  Meloxicam, meclizine Resume as need at discharge    Clinically significant medication issues were identified that warrant physician communication and completion of prescribed/recommended actions by midnight of the next day:  No  Name of provider notified for urgent issues identified:   Provider Method of Notification:     Pharmacist comments:   Time spent performing this drug regimen review (minutes):  20   Ulyses Southward, PharmD, BCIDP, AAHIVP, CPP Infectious Disease Pharmacist

## 2022-07-23 ENCOUNTER — Inpatient Hospital Stay (HOSPITAL_COMMUNITY): Payer: 59

## 2022-07-23 DIAGNOSIS — G959 Disease of spinal cord, unspecified: Secondary | ICD-10-CM | POA: Diagnosis not present

## 2022-07-23 DIAGNOSIS — M7989 Other specified soft tissue disorders: Secondary | ICD-10-CM | POA: Diagnosis not present

## 2022-07-23 LAB — GLUCOSE, CAPILLARY
Glucose-Capillary: 127 mg/dL — ABNORMAL HIGH (ref 70–99)
Glucose-Capillary: 184 mg/dL — ABNORMAL HIGH (ref 70–99)
Glucose-Capillary: 185 mg/dL — ABNORMAL HIGH (ref 70–99)
Glucose-Capillary: 160 mg/dL — ABNORMAL HIGH (ref 70–99)

## 2022-07-23 NOTE — Progress Notes (Signed)
PROGRESS NOTE   Subjective/Complaints: Feeling pretty well  Hypotensive Bed being adjusted by Alcario Drought nursing Patient's chart reviewed- No issues reported overnight  ROS: denies pain   Objective:   No results found. No results for input(s): "WBC", "HGB", "HCT", "PLT" in the last 72 hours. No results for input(s): "NA", "K", "CL", "CO2", "GLUCOSE", "BUN", "CREATININE", "CALCIUM" in the last 72 hours.  Intake/Output Summary (Last 24 hours) at 07/23/2022 1129 Last data filed at 07/22/2022 1838 Gross per 24 hour  Intake 75 ml  Output --  Net 75 ml        Physical Exam: Vital Signs Blood pressure (!) 102/55, pulse 62, temperature 99.2 F (37.3 C), resp. rate 18, height 5\' 4"  (1.626 m), weight 54.3 kg, last menstrual period 01/21/2012, SpO2 100 %. Gen: no distress, normal appearing HEENT: oral mucosa pink and moist, NCAT, +soft cervical collar Cardio: Reg rate Chest: normal effort, normal rate of breathing Abd: soft, non-distended Ext: no edema Psych: pleasant, normal affect Skin: C/D/I. No apparent lesions.  Posterior neck surgical site covered in clean honeycomb dressing. MSK:      No apparent deformity.      Strength:                RUE: 4+ to 5-/5 throughout                LUE: 4/5 SA, 4-/5 EF, 4-/5 EE, 3+/5 WE, 3/5 FF, 3/5 FA                 RLE: 4 out of 5 throughout                LLE: 4 of 5 throughout   Neurologic exam:  Cognition: AAO to person, place, time and event.  Language: Fluent, No substitutions or neoglisms. No dysarthria. Names 3/3 objects correctly.  Memory: Recalls 3/3 objects at 5 minutes. No apparent deficits  Insight: Good insight into current condition.  Mood: Pleasant affect, appropriate mood.  Sensation: To light touch intact in BL UEs and LEs  Reflexes: Hyporeflexic in bilateral upper extremities and right lower extremity; hyperreflexic at left patella. Negative Hoffman's and babinski  signs bilaterally.  CN: 2-12 grossly intact.  Coordination: Fine motor coordination impaired and ataxia in left upper extremity Spasticity: MAS 0 in all extremities.    Assessment/Plan: 1. Functional deficits which require 3+ hours per day of interdisciplinary therapy in a comprehensive inpatient rehab setting. Physiatrist is providing close team supervision and 24 hour management of active medical problems listed below. Physiatrist and rehab team continue to assess barriers to discharge/monitor patient progress toward functional and medical goals  Care Tool:  Bathing              Bathing assist       Upper Body Dressing/Undressing Upper body dressing        Upper body assist      Lower Body Dressing/Undressing Lower body dressing            Lower body assist       Toileting Toileting    Toileting assist       Transfers Chair/bed transfer  Transfers assist  Chair/bed  transfer activity did not occur: Safety/medical concerns (orthostatic hypotension with sit<>stand)        Locomotion Ambulation   Ambulation assist   Ambulation activity did not occur: Safety/medical concerns (orthostatic hypotension with sit<>stand)          Walk 10 feet activity   Assist  Walk 10 feet activity did not occur: Safety/medical concerns        Walk 50 feet activity   Assist Walk 50 feet with 2 turns activity did not occur: Safety/medical concerns         Walk 150 feet activity   Assist Walk 150 feet activity did not occur: Safety/medical concerns         Walk 10 feet on uneven surface  activity   Assist Walk 10 feet on uneven surfaces activity did not occur: Safety/medical concerns         Wheelchair     Assist Is the patient using a wheelchair?: Yes Type of Wheelchair: Manual Wheelchair activity did not occur: Safety/medical concerns (orthostatic hypotension with sit<>stand)         Wheelchair 50 feet with 2 turns  activity    Assist    Wheelchair 50 feet with 2 turns activity did not occur: Safety/medical concerns       Wheelchair 150 feet activity     Assist  Wheelchair 150 feet activity did not occur: Safety/medical concerns       Blood pressure (!) 102/55, pulse 62, temperature 99.2 F (37.3 C), resp. rate 18, height 5\' 4"  (1.626 m), weight 54.3 kg, last menstrual period 01/21/2012, SpO2 100 %.  Medical Problem List and Plan: 1. Functional deficits secondary to cervical myelopathy s/p C5, 6, 7, T1 decompressive laminotomy with P CDF 07/19/2022 per Dr. Dutch Quint.               - Soft cervical collar when out of bed; can be offered bathroom trips at night.             -patient may shower with surgical site covered             -ELOS/Goals: 7 to 10 days, PT/OT mod I to supervision level 2.  Antithrombotics: -DVT/anticoagulation:  Mechanical:  Antiembolism stockings, knee (TED hose) Bilateral lower extremities.  Check vascular study             -antiplatelet therapy: Aspirin 325 mg daily 3. Pain Management: Hydrocodone as needed for pain, Valium as needed for muscle spasms 4. Mood/Behavior/Sleep: Trazodone 150 mg nightly.  Provide emotional support             -antipsychotic agents: N/A 5. Neuropsych/cognition: This patient is capable of making decisions on her own behalf. 6. Skin/Wound Care: Routine skin checks 7. Fluids/Electrolytes/Nutrition: Routine in and outs with follow-up chemistries 8.  COPD/quit smoking 4 years ago.  Continue albuterol as directed 9.  Hyperlipidemia.  Pravachol 10.  Hypertension.  Lisinopril 5 mg daily, Lasix 20 mg daily         --Blood pressure soft 80s over 60s on admission; appears to have been intermittently hypotensive throughout her stay.  Receiving Lasix approximately every other day.  Will DC for now, start daily weights.   5/11 weight reviewed and is 54.3kg 11.  Diabetes mellitus.  Latest hemoglobin A1c 9.3.  Currently SSI, continue 12.  Hypothyroidism.  Continue Synthroid 13.  Constipation. Continue Colace 100 mg daily, Dulcolax suppository daily as needed, MiraLAX daily as needed    LOS: 1 days A FACE TO FACE  EVALUATION WAS PERFORMED  Drema Pry Johnathen Testa 07/23/2022, 11:29 AM

## 2022-07-23 NOTE — Evaluation (Signed)
Physical Therapy Assessment and Plan  Patient Details  Name: Alexandria Webster MRN: [id number] Date of Birth: August 16, 1965  PT Diagnosis: Abnormal posture, Abnormality of gait, Ataxia, Difficulty walking, Impaired sensation, Muscle weakness, Pain in cervical spine, and Vertigo Rehab Potential: Good ELOS: 2-2.5 weeks   Today's Date: 07/23/2022 PT Individual Time: 7425-9563 + [id number] PT Individual Time Calculation (min): 74 min + 57 min  Hospital Problem: Principal Problem:   Cervical myelopathy (HCC)   Past Medical History:  Past Medical History:  Diagnosis Date   Asthma    COPD (chronic obstructive pulmonary disease) (HCC)    DDD (degenerative disc disease), lumbar    Depression    Diabetes mellitus    Gastroparesis    GERD without esophagitis    High cholesterol    Hypertension    Hypothyroidism    PONV (postoperative nausea and vomiting)    PVD (peripheral vascular disease) (HCC)    Wears glasses    Past Surgical History:  Past Surgical History:  Procedure Laterality Date   AORTA - BILATERAL FEMORAL ARTERY BYPASS GRAFT N/A 09/27/2018   Procedure: Redo Exposure  AORTOBIFEMORAL Bypass and bilateral femoral Artery ; Redo Aortobifemoral  BYPASS GRAFT.;  Surgeon: Cephus Shelling, MD;  Location: [facility] OR;  Service: Vascular;  Laterality: N/A;   BACK SURGERY     BIOPSY  10/14/2021   Procedure: BIOPSY;  Surgeon: Lanelle Bal, DO;  Location: [facility] ENDO SUITE;  Service: Endoscopy;;   COLONOSCOPY WITH PROPOFOL N/A 10/14/2021   Procedure: COLONOSCOPY WITH PROPOFOL;  Surgeon: Lanelle Bal, DO;  Location: [facility] ENDO SUITE;  Service: Endoscopy;  Laterality: N/A;  8:30am   ESOPHAGOGASTRODUODENOSCOPY (EGD) WITH PROPOFOL N/A 10/14/2021   Procedure: ESOPHAGOGASTRODUODENOSCOPY (EGD) WITH PROPOFOL;  Surgeon: Lanelle Bal, DO;  Location: [facility];  Service: Endoscopy;  Laterality: N/A;   FEMORAL ARTERY STENT  right leg   POSTERIOR CERVICAL FUSION/FORAMINOTOMY N/A 07/19/2022    Procedure: Posterior cervical fusion with lateral mass fixation - Cervical four-cervical five, Cervical five-Cervical six - Cervical six-Cervical seven - Cervical seven-Thoracic one with laminectomy;  Surgeon: Julio Sicks, MD;  Location: [facility] OR;  Service: Neurosurgery;  Laterality: N/A;   TUBAL LIGATION      Assessment & Plan Clinical Impression: Patient is a 57 year old right-handed female with history of diabetes mellitus, hypertension, hyperlipidemia, COPD who quit smoking 4 years ago. Per chart review patient lives with husband and daughter. 1 level home 5 steps to entry. Daughter was assisting with ADLs. Presented 07/19/2022 with bilateral upper extremity numbness and paresthesias as well as weakness with increasing gait instability and frequent falls over the past year. Initially patient stated her right side was weaker however more recently her left side has become more affected. CT/MRI of the brain showed no acute intracranial abnormality there was a small remote lacunar infarct about the thalami and cerebellum. MRI of the cervical spine showed multilevel cervical spondylosis with resultant diffuse spinal stenosis, severe in nature at C5-6 and C6-7. Patchy cord signal changes at these levels consistent with myelomalacia. Question additional subtle cord signal abnormality at the level of C3-4. Patient underwent C5-6-C7, T1 decompressive laminotomy with C4-5-6-C7 and T1 posterior lateral arthrodesis 07/19/2022 per Dr. Jordan Likes. Placed in a cervical soft collar that can be off when in bed. Blood pressures have been soft and monitored when up with therapies. Therapy evaluations completed due to patient's decreased functional mobility was admitted for a comprehensive rehab program.   Patient currently requires mod with mobility secondary to muscle  weakness, decreased cardiorespiratoy endurance, impaired timing and sequencing and ataxia,  ,  , decreased memory, vestibular deficits, and decreased sitting balance,  decreased standing balance, decreased postural control, and decreased balance strategies.  Prior to hospitalization, patient was modified independent  with mobility and lived with Daughter in a House home.  Home access is 5Stairs to enter.  Patient will benefit from skilled PT intervention to maximize safe functional mobility, minimize fall risk, and decrease caregiver burden for planned discharge home with 24 hour assist.  Anticipate patient will benefit from follow up [facility] at discharge.  PT - End of Session Activity Tolerance: Tolerates < 10 min activity with changes in vital signs Endurance Deficit: Yes Endurance Deficit Description: significantly decreased endurance and orthostasis with position changes PT Assessment Rehab Potential (ACUTE/IP ONLY): Good PT Barriers to Discharge: Inaccessible home environment;Home environment access/layout PT Barriers to Discharge Comments: 5 STE PT Patient demonstrates impairments in the following area(s): Balance;Endurance;Motor;Pain;Perception;Safety;Sensory PT Transfers Functional Problem(s): Bed Mobility;Bed to Chair;Car PT Locomotion Functional Problem(s): Ambulation;Stairs PT Plan PT Intensity: Minimum of 1-2 x/day ,45 to 90 minutes PT Frequency: Total of 15 hours over 7 days of combined therapies PT Duration Estimated Length of Stay: 2-2.5 weeks PT Treatment/Interventions: Warden/ranger;Ambulation/gait training;Community reintegration;DME/adaptive equipment instruction;Neuromuscular re-education;UE/LE Strength taining/ROM;Stair training;Discharge planning;Functional electrical stimulation;Pain management;Therapeutic Activities;UE/LE Coordination activities;Disease management/prevention;Functional mobility training;Patient/family education;Therapeutic Exercise;Visual/perceptual remediation/compensation PT Transfers Anticipated Outcome(s): supervision PT Locomotion Anticipated Outcome(s): min assist PT Recommendation Recommendations for  Other Services: Vestibular eval Follow Up Recommendations: Home health PT Patient destination: Home Equipment Recommended: To be determined Equipment Details: need to confirm equipment with family, may have WC and RW   PT Evaluation Precautions/Restrictions Precautions Precautions: Fall;Cervical Required Braces or Orthoses: Cervical Brace Cervical Brace: Soft collar Other Brace: can be off in bed, can be off for bathroom trips at night Restrictions Weight Bearing Restrictions: No General    Vital Signs (Session 1): With immediate sitting: BP 109/65 mmHg, HR 120 With prolonged sitting and immediate return to supine due to persistent symptoms: BP 78/48 mmHg, HR 98 With prolonged supine : BP 116/59, HR 106 With standing (after return to supine secondary to signficant increased in symptoms: BP 95/67 mmHg Pain Interference Pain Interference Pain Effect on Sleep: 4. Almost constantly Pain Interference with Therapy Activities: 3. Frequently Pain Interference with Day-to-Day Activities: 4. Almost constantly Home Living/Prior Functioning Home Living Available Help at Discharge: Family;Available 24 hours/day;Other (Comment) (boyfriend & 72 yo son PRN) Type of Home: House Home Access: Stairs to enter Secretary/administrator of Steps: 5 Entrance Stairs-Rails: Can reach both Home Layout: One level Bathroom Shower/Tub: Radio producer Accessibility: Yes Additional Comments: Need to verify details with daughter as pt with conflicting history and no family present during eval, however currently reports has RW, TTB, BSC, SPC and quad cane  Lives With: Daughter Prior Function Level of Independence: Independent with transfers;Independent with gait;Needs assistance with ADLs (patient reports daughter was assisting with toileting hygiene, bathing, dressing but able to walk with walker and transfer herself) Bath: Minimal Toileting:  Minimal Dressing: Minimal  Able to Take Stairs?: Yes Driving: Yes Cognition Orientation Level: Oriented X4 Attention: Sustained Sustained Attention: Impaired Sustained Attention Impairment: Verbal complex Executive Function: Sequencing Sequencing: Impaired Sequencing Impairment: Environmental consultant Light Touch: Appears Intact Hot/Cold: Appears Intact Proprioception: Impaired by gross assessment Stereognosis: Not tested Coordination Gross Motor Movements are Fluid and Coordinated: No Fine Motor Movements are Fluid and Coordinated: No Coordination and Movement Description: assessed functionally, demonstrates decreased coordination  BLEs Finger Nose Finger Test: Impaired bilaterally, LUE > RUE Motor  Motor Motor: Ataxia;Abnormal postural alignment and control Motor - Skilled Clinical Observations: uncoordinated movements noted in BUE/BLE with mobility  Trunk/Postural Assessment  Cervical Assessment Cervical Assessment: Exceptions to [facility] Cervical Strength Overall Cervical Strength: Deficits Overall Cervical Strength Comments: impaired due to surgery, maintains kyphotic posture Thoracic Assessment Thoracic Assessment: Exceptions to [facility] Thoracic Strength Overall Thoracic Strength Comments: maintains kyphotic posture unable to correct due to pain and weakness Lumbar Assessment Lumbar Assessment: Exceptions to [facility] Postural Control Postural Control: Deficits on evaluation Head Control: decreased due to surgery, kyphotic posture with soft collar Trunk Control: decreased due to surgery, kyphotic posture with soft collar Righting Reactions: decreased Protective Responses: decreased Postural Limitations: decreased  Balance Balance Balance Assessed: Yes Static Sitting Balance Static Sitting - Balance Support: Feet supported;Bilateral upper extremity supported Static Sitting - Level of Assistance: 5: Stand by assistance Dynamic Sitting Balance Sitting balance -  Comments: sitting EOB with kyphotic posture. Static Standing Balance Static Standing - Balance Support: Bilateral upper extremity supported Static Standing - Level of Assistance: 5: Stand by assistance (CGA) Static Standing - Comment/# of Minutes: 30 seconds, unable to maintain due to increased dizziness and pain in head Extremity Assessment  RUE Assessment RUE Assessment: Exceptions to [facility] Active Range of Motion (AROM) Comments: WFL elbow > hand, did not assess proximally 2/2 cerivical precautions General Strength Comments: Did not formally assess proximally but 3-/5 at shoulder within functional context, 4-/5 distally including grasp LUE Assessment LUE Assessment: Exceptions to [facility] Active Range of Motion (AROM) Comments: WFL elbow > hand, did not assess proximally 2/2 cerivical precautions General Strength Comments: Did not formally assess proximally but 3-/5 at shoulder within functional context, 3+/5 distally including grasp      Care Tool Care Tool Bed Mobility Roll left and right activity   Roll left and right assist level: Minimal Assistance - Patient > 75%    Sit to lying activity   Sit to lying assist level: Minimal Assistance - Patient > 75%    Lying to sitting on side of bed activity   Lying to sitting on side of bed assist level: the ability to move from lying on the back to sitting on the side of the bed with no back support.: Moderate Assistance - Patient 50 - 74%     Care Tool Transfers Sit to stand transfer Sit to stand activity did not occur: Safety/medical concerns Sit to stand assist level: Minimal Assistance - Patient > 75%    Chair/bed transfer Chair/bed transfer activity did not occur: Safety/medical concerns (orthostatic hypotension with sit<>stand)       Toilet transfer Toilet transfer activity did not occur: Safety/medical concerns      Licensed conveyancer transfer activity did not occur: Safety/medical concerns        Care Tool Locomotion Ambulation  Ambulation activity did not occur: Safety/medical concerns (orthostatic hypotension with sit<>stand)        Walk 10 feet activity Walk 10 feet activity did not occur: Safety/medical concerns       Walk 50 feet with 2 turns activity Walk 50 feet with 2 turns activity did not occur: Safety/medical concerns      Walk 150 feet activity Walk 150 feet activity did not occur: Safety/medical concerns      Walk 10 feet on uneven surfaces activity Walk 10 feet on uneven surfaces activity did not occur: Safety/medical concerns      Stairs Stair activity did not  occur: Safety/medical concerns        Walk up/down 1 step activity Walk up/down 1 step or curb (drop down) activity did not occur: Safety/medical concerns      Walk up/down 4 steps activity Walk up/down 4 steps activity did not occur: Safety/medical concerns      Walk up/down 12 steps activity Walk up/down 12 steps activity did not occur: Safety/medical concerns      Pick up small objects from floor Pick up small object from the floor (from standing position) activity did not occur: Safety/medical concerns      Wheelchair Is the patient using a wheelchair?: Yes Type of Wheelchair: Manual Wheelchair activity did not occur: Safety/medical concerns (orthostatic hypotension with sit<>stand)      Wheel 50 feet with 2 turns activity Wheelchair 50 feet with 2 turns activity did not occur: Safety/medical concerns    Wheel 150 feet activity Wheelchair 150 feet activity did not occur: Safety/medical concerns      Refer to Care Plan for Long Term Goals  SHORT TERM GOAL WEEK 1 PT Short Term Goal 1 (Week 1): Pt will complete bed mobility with min assist PT Short Term Goal 2 (Week 1): Pt will complete stand pivot transfers with LRAD min assist PT Short Term Goal 3 (Week 1): Pt will initiate gait training PT Short Term Goal 4 (Week 1): Pt will tolerate sitting upright for 5 minutes without orthostasis  Recommendations for other  services: Other: Vestibular evaluation  Skilled Therapeutic Intervention  SESSION 1: Evaluation completed (see details above and below) with education on PT POC and goals and individual treatment initiated with focus on  bed mobility, transfers, education on dizziness and difference between hypotension and vestibular symptoms as well as compensatory strategies for orthostatic hypotension and vestibular symptoms (TED hose, transitioning slowly from positions). Pt vitals assessed throughout session with pt demonstrating positive orthostatic hypotension with bed mobility supine>sit and sit<>stands with pt verbalizing symptoms (see vitals section above for details). Pt also reporting "dizziness" when [facility] was lowered and with rolling, indicating vestibular origin for dizziness and would benefit from vestibular evaluation. Pt does report history of vertigo and use of vertigo medications. Pt unable to complete transfer OOB secondary to significant pain in head/neck as well as signficant increase in dizziness and hypotension requiring quick return to supine.  SESSION 2: Pt presents in room in bed, reporting decrease in pain and agreeable to PT. Pt completes rolling with min assist and use of hospital bed rails, scoots towards [facility] with mod assist using BUEs on hospital bed rails and verbal cues for sequencing. Pt vitals assessed throughout session. Pt vitals assessed in supine to be BP  81/43, HR 103 bpm. Session focused on supine therex to promote improved BLE strengthening and coordination for functional transfers and ambulation as well as to elevate BP.  Pt completes supine therex including: marching 2x10 BLE hip abduciton 2x10 BLE SLR 2x10 BLE (max verbal/visual/tactile cues for sequencing, active assist for LLE with 2nd set due to impaired sequencing) Bent knee fall out x10 BLE (max verbal/visual/tactile cues for sequencing) Bridging 2x10  Vitals assessed between sets of exercises in supine with HOB  elevated BP 78/61 mmHg, HR 109 bpm after first set, BP 101/62, HR 102 bpm after second set.  OOB mobility deferred this session secondary to hypotension in supine. Pt remains supine in bed with all needs within reach, call light in place, and bed alarm activated at end of session. RN notified of BP findings at end  of session.    Mobility Bed Mobility Bed Mobility: Rolling Right;Rolling Left;Supine to Sit;Sit to Supine Rolling Right: Minimal Assistance - Patient > 75% (verbal cues for sequencing and maintainin precautions with log roll) Rolling Left: Minimal Assistance - Patient > 75% (verbal cues for sequencing and maintainin precautions with log roll) Supine to Sit: Moderate Assistance - Patient 50-74% (verbal cues for sequencing and maintainin precautions with log roll, manual facilitation for trunk to upright) Sit to Supine: Moderate Assistance - Patient 50-74% (verbal cues for sequencing and maintainin precautions with log roll, manual facilitation for BLEs) Transfers Transfers: Sit to Stand;Stand to Sit Sit to Stand: Minimal Assistance - Patient > 75% Stand to Sit: Minimal Assistance - Patient > 75% Transfer (Assistive device): Rolling walker Locomotion  Gait Ambulation: No Gait Gait: No Stairs / Additional Locomotion Stairs: No Wheelchair Mobility Wheelchair Mobility: No (medical/safety concerns)   Discharge Criteria: Patient will be discharged from PT if patient refuses treatment 3 consecutive times without medical reason, if treatment goals not met, if there is a change in medical status, if patient makes no progress towards goals or if patient is discharged from hospital.  The above assessment, treatment plan, treatment alternatives and goals were discussed and mutually agreed upon: by patient  Edwin Cap PT, DPT 07/23/2022, 12:42 PM

## 2022-07-23 NOTE — Plan of Care (Signed)
  Problem: RH Balance Goal: LTG Patient will maintain dynamic standing balance (PT) Description: LTG:  Patient will maintain dynamic standing balance with assistance during mobility activities (PT) Flowsheets (Taken 07/23/2022 1251) LTG: Pt will maintain dynamic standing balance during mobility activities with:: Supervision/Verbal cueing   Problem: Sit to Stand Goal: LTG:  Patient will perform sit to stand with assistance level (PT) Description: LTG:  Patient will perform sit to stand with assistance level (PT) Flowsheets (Taken 07/23/2022 1251) LTG: PT will perform sit to stand in preparation for functional mobility with assistance level: Supervision/Verbal cueing   Problem: RH Bed Mobility Goal: LTG Patient will perform bed mobility with assist (PT) Description: LTG: Patient will perform bed mobility with assistance, with/without cues (PT). Flowsheets (Taken 07/23/2022 1251) LTG: Pt will perform bed mobility with assistance level of: Supervision/Verbal cueing   Problem: RH Bed to Chair Transfers Goal: LTG Patient will perform bed/chair transfers w/assist (PT) Description: LTG: Patient will perform bed to chair transfers with assistance (PT). Flowsheets (Taken 07/23/2022 1251) LTG: Pt will perform Bed to Chair Transfers with assistance level: Supervision/Verbal cueing   Problem: RH Ambulation Goal: LTG Patient will ambulate in controlled environment (PT) Description: LTG: Patient will ambulate in a controlled environment, # of feet with assistance (PT). Flowsheets (Taken 07/23/2022 1251) LTG: Pt will ambulate in controlled environ  assist needed:: Minimal Assistance - Patient > 75% LTG: Ambulation distance in controlled environment: 60' Note: With LRAD   Problem: RH Stairs Goal: LTG Patient will ambulate up and down stairs w/assist (PT) Description: LTG: Patient will ambulate up and down # of stairs with assistance (PT) Flowsheets (Taken 07/23/2022 1251) LTG: Pt will ambulate up/down  stairs assist needed:: Minimal Assistance - Patient > 75% LTG: Pt will  ambulate up and down number of stairs: 5

## 2022-07-23 NOTE — Evaluation (Signed)
Occupational Therapy Assessment and Plan  Patient Details  Name: Alexandria Webster MRN: [id number] Date of Birth: 1965/12/20  OT Diagnosis: abnormal posture, acute pain, cognitive deficits, disturbance of vision, muscle weakness (generalized), pain in thoracic spine, and pain in cervical spine Rehab Potential: Rehab Potential (ACUTE ONLY): Fair ELOS: 2-2.5 weeks   Today's Date: 07/23/2022 OT Individual Time: 1050-1200 OT Individual Time Calculation (min): 70 min     Hospital Problem: Principal Problem:   Cervical myelopathy (HCC)   Past Medical History:  Past Medical History:  Diagnosis Date   Asthma    COPD (chronic obstructive pulmonary disease) (HCC)    DDD (degenerative disc disease), lumbar    Depression    Diabetes mellitus    Gastroparesis    GERD without esophagitis    High cholesterol    Hypertension    Hypothyroidism    PONV (postoperative nausea and vomiting)    PVD (peripheral vascular disease) (HCC)    Wears glasses    Past Surgical History:  Past Surgical History:  Procedure Laterality Date   AORTA - BILATERAL FEMORAL ARTERY BYPASS GRAFT N/A 09/27/2018   Procedure: Redo Exposure  AORTOBIFEMORAL Bypass and bilateral femoral Artery ; Redo Aortobifemoral  BYPASS GRAFT.;  Surgeon: Cephus Shelling, MD;  Location: [facility] OR;  Service: Vascular;  Laterality: N/A;   BACK SURGERY     BIOPSY  10/14/2021   Procedure: BIOPSY;  Surgeon: Lanelle Bal, DO;  Location: [facility] SUITE;  Service: Endoscopy;;   COLONOSCOPY WITH PROPOFOL N/A 10/14/2021   Procedure: COLONOSCOPY WITH PROPOFOL;  Surgeon: Lanelle Bal, DO;  Location: [facility];  Service: Endoscopy;  Laterality: N/A;  8:30am   ESOPHAGOGASTRODUODENOSCOPY (EGD) WITH PROPOFOL N/A 10/14/2021   Procedure: ESOPHAGOGASTRODUODENOSCOPY (EGD) WITH PROPOFOL;  Surgeon: Lanelle Bal, DO;  Location: [facility];  Service: Endoscopy;  Laterality: N/A;   FEMORAL ARTERY STENT  right leg   POSTERIOR CERVICAL  FUSION/FORAMINOTOMY N/A 07/19/2022   Procedure: Posterior cervical fusion with lateral mass fixation - Cervical four-cervical five, Cervical five-Cervical six - Cervical six-Cervical seven - Cervical seven-Thoracic one with laminectomy;  Surgeon: Julio Sicks, MD;  Location: [facility] OR;  Service: Neurosurgery;  Laterality: N/A;   TUBAL LIGATION      Assessment & Plan Clinical Impression:  Alexandria Webster is a 57 year old right-handed female with history of diabetes mellitus, hypertension, hyperlipidemia, COPD who quit smoking 4 years ago. Per chart review patient lives with husband and daughter. 1 level home 5 steps to entry. Daughter was assisting with ADLs. Presented 07/19/2022 with bilateral upper extremity numbness and paresthesias as well as weakness with increasing gait instability and frequent falls over the past year. Initially patient stated her right side was weaker however more recently her left side has become more affected. CT/MRI of the brain showed no acute intracranial abnormality there was a small remote lacunar infarct about the thalami and cerebellum. MRI of the cervical spine showed multilevel cervical spondylosis with resultant diffuse spinal stenosis, severe in nature at C5-6 and C6-7. Patchy cord signal changes at these levels consistent with myelomalacia. Question additional subtle cord signal abnormality at the level of C3-4. Patient underwent C5-6-C7, T1 decompressive laminotomy with C4-5-6-C7 and T1 posterior lateral arthrodesis 07/19/2022 per Dr. Jordan Likes. Placed in a cervical soft collar that can be off when in bed. Blood pressures have been soft and monitored when up with therapies. Therapy evaluations completed due to patient's decreased functional mobility was admitted for a comprehensive rehab program. Patient transferred to  [facility] on 07/22/2022 .    Patient currently requires max with basic self-care skills secondary to muscle weakness, decreased cardiorespiratoy endurance, decreased  coordination, decreased visual acuity, decreased attention, decreased awareness, decreased problem solving, and decreased memory, and decreased sitting balance, decreased postural control, and difficulty maintaining precautions.  Prior to hospitalization, patient could complete all self-care with min A.  Patient will benefit from skilled intervention to decrease level of assist with basic self-care skills and increase independence with basic self-care skills prior to discharge home with care partner.  Anticipate patient will require 24 hour supervision and minimal physical assistance and follow up home health.  OT - End of Session Activity Tolerance: Tolerates < 10 min activity, no significant change in vital signs Endurance Deficit: Yes Endurance Deficit Description: Significant global deconditioning requiring rest just with bed mobility transition OT Assessment Rehab Potential (ACUTE ONLY): Fair OT Barriers to Discharge: Home environment access/layout;Neurogenic Bowel & Bladder;Nutrition means OT Patient demonstrates impairments in the following area(s): Balance;Cognition;Endurance;Motor;Pain;Perception;Sensory;Vision;Skin Integrity OT Basic ADL's Functional Problem(s): Eating;Grooming;Bathing;Dressing;Toileting OT Transfers Functional Problem(s): Toilet;Tub/Shower OT Additional Impairment(s): Fuctional Use of Upper Extremity (BUE) OT Plan OT Intensity: Minimum of 1-2 x/day, 45 to 90 minutes OT Frequency: 5 out of 7 days OT Duration/Estimated Length of Stay: 2-2.5 weeks OT Treatment/Interventions: Balance/vestibular training;Discharge planning;Pain management;Self Care/advanced ADL retraining;Therapeutic Activities;UE/LE Coordination activities;Cognitive remediation/compensation;Disease mangement/prevention;Functional mobility training;Patient/family education;Skin care/wound managment;Therapeutic Exercise;Visual/perceptual remediation/compensation;DME/adaptive equipment  instruction;Neuromuscular re-education;Psychosocial support;Splinting/orthotics;UE/LE Strength taining/ROM;Wheelchair propulsion/positioning OT Self Feeding Anticipated Outcome(s): Set up A OT Basic Self-Care Anticipated Outcome(s): Min A OT Toileting Anticipated Outcome(s): Min A OT Bathroom Transfers Anticipated Outcome(s): Supervision OT Recommendation Recommendations for Other Services: Neuropsych consult;Therapeutic Recreation consult Therapeutic Recreation Interventions: Pet therapy Patient destination: Home Follow Up Recommendations: Home health OT;24 hour supervision/assistance Equipment Recommended: To be determined   OT Evaluation Precautions/Restrictions  Precautions Precautions: Fall;Cervical Required Braces or Orthoses: Cervical Brace Cervical Brace: Soft collar Other Brace: can be off in bed, can be off for bathroom trips at night Restrictions Weight Bearing Restrictions: No Home Living/Prior Functioning Home Living Family/patient expects to be discharged to:: Private residence Living Arrangements: Alone Available Help at Discharge: Family, Available 24 hours/day, Other (Comment) (boyfriend & 53 yo son PRN) Type of Home: House Home Access: Stairs to enter Secretary/administrator of Steps: 5 Entrance Stairs-Rails: Can reach both Home Layout: One level Bathroom Shower/Tub: Tub/shower unit, Development worker, community Accessibility: Yes Additional Comments: Need to verify details with daughter as pt with conflicting history and no family present during eval, however currently reports has RW, TTB, BSC, SPC and quad cane  Lives With: Daughter IADL History Homemaking Responsibilities: No Current License: Yes Leisure and Hobbies: Go outside, play with dogs IADL Comments: Did not have this responsibility while living with daughter and doesn't plan to have at DC Prior Function Level of Independence: Independent with transfers, Independent with gait, Needs  assistance with ADLs (patient reports daughter was assisting with toileting hygiene, bathing, dressing but able to walk with walker and transfer herself) Bath: Minimal Toileting: Minimal Dressing: Minimal  Able to Take Stairs?: Yes Driving: Yes Vision Baseline Vision/History: 1 Wears glasses (readers) Ability to See in Adequate Light: 2 Moderately impaired Patient Visual Report: Blurring of vision;Eye fatigue/eye pain/headache Vision Assessment?: Yes Eye Alignment: Within Functional Limits Ocular Range of Motion: Impaired-to be further tested in functional context Alignment/Gaze Preference: Within Defined Limits Tracking/Visual Pursuits: Decreased smoothness of horizontal tracking;Left eye does not track laterally Saccades: Additional eye shifts occurred during testing;Impaired - to be further tested in functional  context;Overshoots Convergence: Impaired (comment) Visual Fields: Impaired-to be further tested in functional context Depth Perception: Overshoots Perception  Perception: Impaired Praxis Praxis: Intact Cognition Cognition Overall Cognitive Status: Within Functional Limits for tasks assessed Arousal/Alertness: Lethargic Orientation Level: Person;Place Person: Oriented Place: Oriented Situation: Disoriented ("here for a fall a year ago, no surgery") Memory: Impaired Attention: Sustained;Selective;Alternating Sustained Attention: Appears intact Selective Attention: Appears intact Awareness: Impaired Problem Solving: Impaired Executive Function: Sequencing Sequencing: Impaired Sequencing Impairment: Verbal basic Safety/Judgment: Appears intact Brief Interview for Mental Status (BIMS) Repetition of Three Words (First Attempt): 3 Temporal Orientation: Year: Nonsensical (gave daughters birthdate year) Temporal Orientation: Month: Accurate within 5 days Temporal Orientation: Day: Incorrect Recall: "Sock": Nonsensical ("white") Recall: "Blue": Yes, no cue  required Recall: "Bed": Yes, no cue required BIMS Summary Score: 9 Sensation Sensation Light Touch: Appears Intact Hot/Cold: Appears Intact Proprioception: Impaired by gross assessment Stereognosis: Not tested Coordination Gross Motor Movements are Fluid and Coordinated: No Fine Motor Movements are Fluid and Coordinated: No Coordination and Movement Description: assessed functionally, demonstrates decreased coordination BLEs Finger Nose Finger Test: Impaired bilaterally, LUE > RUE Motor  Motor Motor: Ataxia;Abnormal postural alignment and control Motor - Skilled Clinical Observations: uncoordinated movements noted in BUE/BLE with mobility  Trunk/Postural Assessment  Cervical Assessment Cervical Assessment: Exceptions to [facility] Cervical Strength Overall Cervical Strength: Deficits Overall Cervical Strength Comments: impaired due to surgery, maintains kyphotic posture Thoracic Assessment Thoracic Assessment: Exceptions to [facility] Thoracic Strength Overall Thoracic Strength Comments: maintains kyphotic posture unable to correct due to pain and weakness Lumbar Assessment Lumbar Assessment: Exceptions to [facility] Postural Control Postural Control: Deficits on evaluation Head Control: decreased due to surgery, kyphotic posture with soft collar Trunk Control: decreased due to surgery, kyphotic posture with soft collar Righting Reactions: decreased Protective Responses: decreased Postural Limitations: decreased  Balance Balance Balance Assessed: Yes Static Sitting Balance Static Sitting - Balance Support: Feet supported;Bilateral upper extremity supported Static Sitting - Level of Assistance: 5: Stand by assistance Dynamic Sitting Balance Sitting balance - Comments: sitting EOB with kyphotic posture. Static Standing Balance Static Standing - Balance Support: Bilateral upper extremity supported Static Standing - Level of Assistance: 5: Stand by assistance (CGA) Static Standing - Comment/#  of Minutes: 30 seconds, unable to maintain due to increased dizziness and pain in head Extremity/Trunk Assessment RUE Assessment RUE Assessment: Exceptions to [facility] Active Range of Motion (AROM) Comments: WFL elbow > hand, did not assess proximally 2/2 cerivical precautions General Strength Comments: Did not formally assess proximally but 3-/5 at shoulder within functional context, 4-/5 distally including grasp LUE Assessment LUE Assessment: Exceptions to [facility] Active Range of Motion (AROM) Comments: WFL elbow > hand, did not assess proximally 2/2 cerivical precautions General Strength Comments: Did not formally assess proximally but 3-/5 at shoulder within functional context, 3+/5 distally including grasp  Care Tool Care Tool Self Care Eating   Eating Assist Level: Minimal Assistance - Patient > 75%    Oral Care    Oral Care Assist Level: Minimal Assistance - Patient > 75%    Bathing   Body parts bathed by patient: Right arm;Left arm;Chest;Abdomen;Face Body parts bathed by helper: Front perineal area;Buttocks;Right upper leg;Left upper leg;Right lower leg;Left lower leg   Assist Level: Maximal Assistance - Patient 24 - 49%    Upper Body Dressing(including orthotics)   What is the patient wearing?: Pull over shirt;Orthosis (cervical collar) Orthosis activity level: Performed by helper Assist Level: Maximal Assistance - Patient 25 - 49%    Lower Body Dressing (excluding footwear)  What is the patient wearing?: Pants Assist for lower body dressing: Maximal Assistance - Patient 25 - 49%    Putting on/Taking off footwear   What is the patient wearing?: Non-skid slipper socks Assist for footwear: Dependent - Patient 0%       Care Tool Toileting Toileting activity Toileting Activity did not occur (Clothing management and hygiene only): Refused       Care Tool Bed Mobility Roll left and right activity   Roll left and right assist level: Minimal Assistance - Patient > 75%    Sit  to lying activity   Sit to lying assist level: Minimal Assistance - Patient > 75%    Lying to sitting on side of bed activity   Lying to sitting on side of bed assist level: the ability to move from lying on the back to sitting on the side of the bed with no back support.: Moderate Assistance - Patient 50 - 74%     Care Tool Transfers Sit to stand transfer Sit to stand activity did not occur: Safety/medical concerns    Chair/bed transfer Chair/bed transfer activity did not occur: Safety/medical concerns (orthostatic hypotension with sit<>stand)       Toilet transfer Toilet transfer activity did not occur: Safety/medical concerns       Care Tool Cognition  Expression of Ideas and Wants Expression of Ideas and Wants: 4. Without difficulty (complex and basic) - expresses complex messages without difficulty and with speech that is clear and easy to understand  Understanding Verbal and Non-Verbal Content Understanding Verbal and Non-Verbal Content: 4. Understands (complex and basic) - clear comprehension without cues or repetitions   Memory/Recall Ability Memory/Recall Ability : Current season;Staff names and faces;That he or she is in a hospital/hospital unit;Location of own room   Refer to Care Plan for Long Term Goals  SHORT TERM GOAL WEEK 1 OT Short Term Goal 1 (Week 1): Pt will complete sit > stand in prep for ADL with Min A using LRAD OT Short Term Goal 2 (Week 1): Pt will complete toilet transfer with min A using LRAD to promote OOB toileting OT Short Term Goal 3 (Week 1): Pt will complete thread BLE into LB clothing using AE PRN with CGA for sitting balance OT Short Term Goal 4 (Week 1): Pt will demonstrate improved recall of cervical precautions for 2 consecutive session with supervision A  Recommendations for other services: Neuropsych and Therapeutic Recreation  Pet therapy   Skilled Therapeutic Intervention Patient received upright in bed asleep upon therapy arrival, easily  woken and agreeable to participate in OT evaluation. 4/10 pain reported in cervical region; pre-medicated. Offered repositioning for pain reduction. Of note- pt was limited by lethargy (per nurse, received several meds in AM), dizziness (potential vestibular dysfunction) and orthostatic hypotension in unsupported sitting. Unable to progress OOB.   Education provided on OT purpose, therapy schedule, goals for therapy, and safety policy while in rehab. No family present to verify details; will need to confirm prior to DC. Patient demonstrates global deconditioning, along with BUE strength, sitting balance, endurance, coordination and cognitive (problem solving, awareness, memory) deficits resulting in difficulty completing BADL tasks without increased physical assist. Pt will benefit from skilled OT services to focus on mentioned deficits. See below for ADL and functional transfer performance. Pt remained upright in bed at conclusion of session with bed alarm on and all needs met at end of session.  Vitals -120/106 BP, 106 HR- In bed with HOB elevated to about 75  deg, knee high TEDs already on; asymptomatic -93/53 BP- sitting EOB; 2/10 dizziness -110/58 BP, 113 HR- after sitting EOB a couple mins; increasing dizziness with report of 5/10   ADL ADL Eating: Minimal assistance Where Assessed-Eating: Bed level Grooming: Minimal assistance Where Assessed-Grooming: Bed level Upper Body Bathing: Supervision/safety;Minimal cueing Where Assessed-Upper Body Bathing: Bed level Lower Body Bathing: Maximal assistance;Maximal cueing Where Assessed-Lower Body Bathing: Bed level Upper Body Dressing: Maximal assistance (including shirt/cervical collar) Where Assessed-Upper Body Dressing: Edge of bed Lower Body Dressing: Maximal assistance Where Assessed-Lower Body Dressing: Bed level Toileting: Unable to assess (declined) Toilet Transfer: Unable to assess (limited by lethargy/dizziness) Toilet Transfer  Method: Unable to assess Tub/Shower Transfer: Unable to assess (limited by lethargy/dizziness) Tub/Shower Transfer Method: Unable to assess Film/video editor: Unable to assess (limited by lethargy/dizziness) Film/video editor Method: Unable to assess Mobility  Bed Mobility Bed Mobility: Rolling Right;Rolling Left;Supine to Sit;Sit to Supine Rolling Right: Minimal Assistance - Patient > 75% (verbal cues for sequencing and maintainin precautions with log roll) Rolling Left: Minimal Assistance - Patient > 75% (verbal cues for sequencing and maintainin precautions with log roll) Supine to Sit: Moderate Assistance - Patient 50-74% (verbal cues for sequencing and maintainin precautions with log roll, manual facilitation for trunk to upright) Sit to Supine: Moderate Assistance - Patient 50-74% (verbal cues for sequencing and maintainin precautions with log roll, manual facilitation for BLEs) Transfers Sit to Stand: Minimal Assistance - Patient > 75% Stand to Sit: Minimal Assistance - Patient > 75%   Discharge Criteria: Patient will be discharged from OT if patient refuses treatment 3 consecutive times without medical reason, if treatment goals not met, if there is a change in medical status, if patient makes no progress towards goals or if patient is discharged from hospital.  The above assessment, treatment plan, treatment alternatives and goals were discussed and mutually agreed upon: by patient  Melvyn Novas, MS, OTR/L  07/23/2022, 12:31 PM

## 2022-07-23 NOTE — Progress Notes (Signed)
Notified Dr. Carlis Abbott of bilateral lower extremity venous duplex Preliminary results.  Tilden Dome, LPN

## 2022-07-23 NOTE — Plan of Care (Signed)
  Problem: RH Balance Goal: LTG: Patient will maintain dynamic sitting balance (OT) Description: LTG:  Patient will maintain dynamic sitting balance with assistance during activities of daily living (OT) Flowsheets (Taken 07/23/2022 1228) LTG: Pt will maintain dynamic sitting balance during ADLs with: Independent with assistive device Goal: LTG Patient will maintain dynamic standing with ADLs (OT) Description: LTG:  Patient will maintain dynamic standing balance with assist during activities of daily living (OT)  Flowsheets (Taken 07/23/2022 1228) LTG: Pt will maintain dynamic standing balance during ADLs with: Supervision/Verbal cueing   Problem: Sit to Stand Goal: LTG:  Patient will perform sit to stand in prep for activites of daily living with assistance level (OT) Description: LTG:  Patient will perform sit to stand in prep for activites of daily living with assistance level (OT) Flowsheets (Taken 07/23/2022 1228) LTG: PT will perform sit to stand in prep for activites of daily living with assistance level: Supervision/Verbal cueing   Problem: RH Eating Goal: LTG Patient will perform eating w/assist, cues/equip (OT) Description: LTG: Patient will perform eating with assist, with/without cues using equipment (OT) Flowsheets (Taken 07/23/2022 1228) LTG: Pt will perform eating with assistance level of: Set up assist    Problem: RH Grooming Goal: LTG Patient will perform grooming w/assist,cues/equip (OT) Description: LTG: Patient will perform grooming with assist, with/without cues using equipment (OT) Flowsheets (Taken 07/23/2022 1228) LTG: Pt will perform grooming with assistance level of: Set up assist    Problem: RH Bathing Goal: LTG Patient will bathe all body parts with assist levels (OT) Description: LTG: Patient will bathe all body parts with assist levels (OT) Flowsheets (Taken 07/23/2022 1228) LTG: Pt will perform bathing with assistance level/cueing: Minimal Assistance - Patient  > 75%   Problem: RH Dressing Goal: LTG Patient will perform upper body dressing (OT) Description: LTG Patient will perform upper body dressing with assist, with/without cues (OT). Flowsheets (Taken 07/23/2022 1228) LTG: Pt will perform upper body dressing with assistance level of: Supervision/Verbal cueing Goal: LTG Patient will perform lower body dressing w/assist (OT) Description: LTG: Patient will perform lower body dressing with assist, with/without cues in positioning using equipment (OT) Flowsheets (Taken 07/23/2022 1228) LTG: Pt will perform lower body dressing with assistance level of: Supervision/Verbal cueing   Problem: RH Toileting Goal: LTG Patient will perform toileting task (3/3 steps) with assistance level (OT) Description: LTG: Patient will perform toileting task (3/3 steps) with assistance level (OT)  Flowsheets (Taken 07/23/2022 1228) LTG: Pt will perform toileting task (3/3 steps) with assistance level: Minimal Assistance - Patient > 75%   Problem: RH Toilet Transfers Goal: LTG Patient will perform toilet transfers w/assist (OT) Description: LTG: Patient will perform toilet transfers with assist, with/without cues using equipment (OT) Flowsheets (Taken 07/23/2022 1228) LTG: Pt will perform toilet transfers with assistance level of: Supervision/Verbal cueing   Problem: RH Tub/Shower Transfers Goal: LTG Patient will perform tub/shower transfers w/assist (OT) Description: LTG: Patient will perform tub/shower transfers with assist, with/without cues using equipment (OT) Flowsheets (Taken 07/23/2022 1228) LTG: Pt will perform tub/shower stall transfers with assistance level of: Minimal Assistance - Patient > 75%   Problem: RH Awareness Goal: LTG: Patient will demonstrate awareness during functional activites type of (OT) Description: LTG: Patient will demonstrate awareness during functional activites type of (OT) Flowsheets (Taken 07/23/2022 1228) Patient will demonstrate  awareness during functional activites type of: Emergent LTG: Patient will demonstrate awareness during functional activites type of (OT): Supervision

## 2022-07-23 NOTE — Progress Notes (Signed)
Bilateral lower extremity venous duplex has been completed. Preliminary results can be found in CV Proc through chart review.   07/23/22 1:50 PM Olen Cordial RVT

## 2022-07-24 DIAGNOSIS — G959 Disease of spinal cord, unspecified: Secondary | ICD-10-CM | POA: Diagnosis not present

## 2022-07-24 LAB — GLUCOSE, CAPILLARY
Glucose-Capillary: 156 mg/dL — ABNORMAL HIGH (ref 70–99)
Glucose-Capillary: 167 mg/dL — ABNORMAL HIGH (ref 70–99)
Glucose-Capillary: 169 mg/dL — ABNORMAL HIGH (ref 70–99)
Glucose-Capillary: 206 mg/dL — ABNORMAL HIGH (ref 70–99)

## 2022-07-24 MED ORDER — LISINOPRIL 5 MG PO TABS
2.5000 mg | ORAL_TABLET | Freq: Every day | ORAL | Status: DC
  Administered 2022-07-26: 2.5 mg via ORAL
  Filled 2022-07-24 (×2): qty 1

## 2022-07-24 NOTE — Progress Notes (Incomplete)
Patient refusing scheduled Trazodone medication this evening. Explained that she has been sleeping throughout the day and decrease in energy during the day. More awake during the night evening hours.

## 2022-07-24 NOTE — Progress Notes (Signed)
MD notified about patient's blood pressure running soft. Patient encouraged to drink more fluids, move around, and was taken to bathroom after bladder scan- but she stated she did not have to go after being put on the [facility].   Patient is in the bed, call-light within reach, and other safety measures implemented.

## 2022-07-24 NOTE — Discharge Instructions (Addendum)
Inpatient Rehab Discharge Instructions  Alexandria Webster Discharge date and time: No discharge date for patient encounter.   Activities/Precautions/ Functional Status: Activity: Cervical collar when out of bed Diet: Diabetic diet Wound Care: Routine skin checks Functional status:  ___ No restrictions     ___ Walk up steps independently ___ 24/7 supervision/assistance   ___ Walk up steps with assistance ___ Intermittent supervision/assistance  ___ Bathe/dress independently ___ Walk with walker     _x__ Bathe/dress with assistance ___ Walk Independently    ___ Shower independently ___ Walk with assistance    ___ Shower with assistance ___ No alcohol     ___ Return to work/school ________  Special Instructions: No driving smoking or alcohol   COMMUNITY REFERRALS UPON DISCHARGE:     Outpatient: PT     OT                 Agency:[facility]    Phone:[phone]               Appointment Date/Time:*Please expect follow-up within 7-10 business days to schedule your appointment. If you have not received follow-up, be sure to contact the site directly.*  Medical Equipment/Items Ordered: rolling walker                                                 Agency/Supplier:[facility] [phone]    My questions have been answered and I understand these instructions. I will adhere to these goals and the provided educational materials after my discharge from the hospital.  Patient/Caregiver Signature _______________________________ Date __________  Clinician Signature _______________________________________ Date __________  Please bring this form and your medication list with you to all your follow-up doctor's appointments.

## 2022-07-24 NOTE — Progress Notes (Signed)
PROGRESS NOTE   Subjective/Complaints: [place] results reviewed and negative for clot She asks where her daughter is, notes that her daughter is visiting today, placed grounds pass  ROS: denies pain   Objective:   VAS [place] LOWER EXTREMITY VENOUS (DVT)  Result Date: 07/23/2022  Lower Venous DVT Study Patient Name:  Alexandria Webster  Date of Exam:   07/23/2022 Medical Rec #: [id number]         Accession #:    [id number] Date of Birth: 03-19-1965         Patient Gender: F Patient Age:   57 years Exam Location:  [facility] Procedure:      VAS [place] LOWER EXTREMITY VENOUS (DVT) Referring Phys: Mariam Dollar --------------------------------------------------------------------------------  Indications: Swelling.  Risk Factors: None identified. Limitations: Poor ultrasound/tissue interface. Comparison Study: No prior studies. Performing Technologist: Chanda Busing RVT  Examination Guidelines: A complete evaluation includes B-mode imaging, spectral Doppler, color Doppler, and power Doppler as needed of all accessible portions of each vessel. Bilateral testing is considered an integral part of a complete examination. Limited examinations for reoccurring indications may be performed as noted. The reflux portion of the exam is performed with the patient in reverse Trendelenburg.  +---------+---------------+---------+-----------+----------+--------------+ RIGHT    CompressibilityPhasicitySpontaneityPropertiesThrombus Aging +---------+---------------+---------+-----------+----------+--------------+ CFV      Full           Yes      Yes                                 +---------+---------------+---------+-----------+----------+--------------+ SFJ      Full                                                        +---------+---------------+---------+-----------+----------+--------------+ FV Prox  Full                                                         +---------+---------------+---------+-----------+----------+--------------+ FV Mid   Full                                                        +---------+---------------+---------+-----------+----------+--------------+ FV DistalFull                                                        +---------+---------------+---------+-----------+----------+--------------+ PFV      Full                                                        +---------+---------------+---------+-----------+----------+--------------+  POP      Full           Yes      Yes                                 +---------+---------------+---------+-----------+----------+--------------+ PTV      Full                                                        +---------+---------------+---------+-----------+----------+--------------+ PERO     Full                                                        +---------+---------------+---------+-----------+----------+--------------+   +---------+---------------+---------+-----------+----------+--------------+ LEFT     CompressibilityPhasicitySpontaneityPropertiesThrombus Aging +---------+---------------+---------+-----------+----------+--------------+ CFV      Full           Yes      Yes                                 +---------+---------------+---------+-----------+----------+--------------+ SFJ      Full                                                        +---------+---------------+---------+-----------+----------+--------------+ FV Prox  Full                                                        +---------+---------------+---------+-----------+----------+--------------+ FV Mid   Full                                                        +---------+---------------+---------+-----------+----------+--------------+ FV DistalFull                                                         +---------+---------------+---------+-----------+----------+--------------+ PFV      Full                                                        +---------+---------------+---------+-----------+----------+--------------+ POP      Full           Yes      Yes                                 +---------+---------------+---------+-----------+----------+--------------+  PTV      Full                                                        +---------+---------------+---------+-----------+----------+--------------+ PERO     Full                                                        +---------+---------------+---------+-----------+----------+--------------+    Summary: RIGHT: - There is no evidence of deep vein thrombosis in the lower extremity.  - No cystic structure found in the popliteal fossa.  LEFT: - There is no evidence of deep vein thrombosis in the lower extremity.  - No cystic structure found in the popliteal fossa.  *See table(s) above for measurements and observations.    Preliminary    No results for input(s): "WBC", "HGB", "HCT", "PLT" in the last 72 hours. No results for input(s): "NA", "K", "CL", "CO2", "GLUCOSE", "BUN", "CREATININE", "CALCIUM" in the last 72 hours.  Intake/Output Summary (Last 24 hours) at 07/24/2022 0937 Last data filed at 07/24/2022 0840 Gross per 24 hour  Intake 440 ml  Output --  Net 440 ml        Physical Exam: Vital Signs Blood pressure 119/67, pulse 95, temperature 98.6 F (37 C), temperature source Oral, resp. rate 18, height 5\' 4"  (1.626 m), weight 54.6 kg, last menstrual period 01/21/2012, SpO2 97 %. Gen: no distress, normal appearing HEENT: oral mucosa pink and moist, NCAT, +soft cervical collar Cardio: Reg rate Chest: normal effort, normal rate of breathing Abd: soft, non-distended Ext: no edema Psych: pleasant, normal affect Skin: C/D/I. No apparent lesions.  Posterior neck surgical site covered in clean honeycomb  dressing. MSK:      No apparent deformity.      Strength:                RUE: 4+ to 5-/5 throughout                LUE: 4/5 SA, 4-/5 EF, 4-/5 EE, 3+/5 WE, 3/5 FF, 3/5 FA                 RLE: 4 out of 5 throughout                LLE: 4 of 5 throughout   Neurologic exam:  Cognition: AAO to person, place, time and event.  Language: Fluent, No substitutions or neoglisms. No dysarthria. Names 3/3 objects correctly.  Memory: Recalls 3/3 objects at 5 minutes. No apparent deficits  Insight: Good insight into current condition.  Mood: Pleasant affect, appropriate mood.  Sensation: To light touch intact in BL UEs and LEs  Reflexes: Hyporeflexic in bilateral upper extremities and right lower extremity; hyperreflexic at left patella. Negative Hoffman's and babinski signs bilaterally.  CN: 2-12 grossly intact.  Coordination: Fine motor coordination impaired and ataxia in left upper extremity Spasticity: MAS 0 in all extremities.    Assessment/Plan: 1. Functional deficits which require 3+ hours per day of interdisciplinary therapy in a comprehensive inpatient rehab setting. Physiatrist is providing close team supervision and 24 hour management of active medical problems listed below. Physiatrist and rehab team continue to  assess barriers to discharge/monitor patient progress toward functional and medical goals  Care Tool:  Bathing    Body parts bathed by patient: Right arm, Left arm, Chest, Abdomen, Face   Body parts bathed by helper: Front perineal area, Buttocks, Right upper leg, Left upper leg, Right lower leg, Left lower leg     Bathing assist Assist Level: Maximal Assistance - Patient 24 - 49%     Upper Body Dressing/Undressing Upper body dressing   What is the patient wearing?: Pull over shirt, Orthosis (cervical collar) Orthosis activity level: Performed by helper  Upper body assist Assist Level: Maximal Assistance - Patient 25 - 49%    Lower Body Dressing/Undressing Lower body  dressing      What is the patient wearing?: Pants     Lower body assist Assist for lower body dressing: Maximal Assistance - Patient 25 - 49%     Toileting Toileting Toileting Activity did not occur Press photographer and hygiene only): Refused  Toileting assist       Transfers Chair/bed transfer  Transfers assist  Chair/bed transfer activity did not occur: Safety/medical concerns (orthostatic hypotension with sit<>stand)        Locomotion Ambulation   Ambulation assist   Ambulation activity did not occur: Safety/medical concerns (orthostatic hypotension with sit<>stand)          Walk 10 feet activity   Assist  Walk 10 feet activity did not occur: Safety/medical concerns        Walk 50 feet activity   Assist Walk 50 feet with 2 turns activity did not occur: Safety/medical concerns         Walk 150 feet activity   Assist Walk 150 feet activity did not occur: Safety/medical concerns         Walk 10 feet on uneven surface  activity   Assist Walk 10 feet on uneven surfaces activity did not occur: Safety/medical concerns         Wheelchair     Assist Is the patient using a wheelchair?: Yes Type of Wheelchair: Manual Wheelchair activity did not occur: Safety/medical concerns (orthostatic hypotension with sit<>stand)         Wheelchair 50 feet with 2 turns activity    Assist    Wheelchair 50 feet with 2 turns activity did not occur: Safety/medical concerns       Wheelchair 150 feet activity     Assist  Wheelchair 150 feet activity did not occur: Safety/medical concerns       Blood pressure 119/67, pulse 95, temperature 98.6 F (37 C), temperature source Oral, resp. rate 18, height 5\' 4"  (1.626 m), weight 54.6 kg, last menstrual period 01/21/2012, SpO2 97 %.  Medical Problem List and Plan: 1. Functional deficits secondary to cervical myelopathy s/p C5, 6, 7, T1 decompressive laminotomy with P CDF 07/19/2022 per  Dr. Dutch Quint.               - Soft cervical collar when out of bed; can be offered bathroom trips at night.             -patient may shower with surgical site covered             -ELOS/Goals: 7 to 10 days, PT/OT mod I to supervision level  Grounds pass ordered 2.  Antithrombotics: -DVT/anticoagulation:  Mechanical:  Antiembolism stockings, knee (TED hose) Bilateral lower extremities.  Check vascular study             -antiplatelet therapy: Aspirin 325 mg  daily 3. Pain Management: Hydrocodone as needed for pain, Valium as needed for muscle spasms 4. Mood/Behavior/Sleep: Trazodone 150 mg nightly.  Provide emotional support             -antipsychotic agents: N/A 5. Neuropsych/cognition: This patient is capable of making decisions on her own behalf. 6. Skin/Wound Care: Routine skin checks 7. Fluids/Electrolytes/Nutrition: Routine in and outs with follow-up chemistries 8.  COPD/quit smoking 4 years ago.  Continue albuterol as directed 9.  Hyperlipidemia.  Pravachol 10.  Hypertension.  Continue Lisinopril 5 mg daily, Lasix 20 mg daily         --Blood pressure soft 80s over 60s on admission; appears to have been intermittently hypotensive throughout her stay.  Receiving Lasix approximately every other day.  Will DC for now, start daily weights.   5/11 weight reviewed and is 54.3kg 11.  Diabetes mellitus.  Latest hemoglobin A1c 9.3.  Currently SSI, continue 12.  Hypothyroidism. Continue Synthroid 13.  Constipation. Continue Colace 100 mg daily, Dulcolax suppository daily as needed, MiraLAX daily as needed    LOS: 2 days A FACE TO FACE EVALUATION WAS PERFORMED  Drema Pry Elaya Droege 07/24/2022, 9:37 AM

## 2022-07-25 ENCOUNTER — Inpatient Hospital Stay (HOSPITAL_COMMUNITY): Payer: 59

## 2022-07-25 DIAGNOSIS — G959 Disease of spinal cord, unspecified: Secondary | ICD-10-CM | POA: Diagnosis not present

## 2022-07-25 LAB — URINALYSIS, ROUTINE W REFLEX MICROSCOPIC
Bilirubin Urine: NEGATIVE
Glucose, UA: 50 mg/dL — AB
Hgb urine dipstick: NEGATIVE
Ketones, ur: NEGATIVE mg/dL
Leukocytes,Ua: NEGATIVE
Nitrite: NEGATIVE
Protein, ur: NEGATIVE mg/dL
Specific Gravity, Urine: 1.005 (ref 1.005–1.030)
pH: 5 (ref 5.0–8.0)

## 2022-07-25 LAB — COMPREHENSIVE METABOLIC PANEL
ALT: 8 U/L (ref 0–44)
AST: 11 U/L — ABNORMAL LOW (ref 15–41)
Albumin: 2.2 g/dL — ABNORMAL LOW (ref 3.5–5.0)
Alkaline Phosphatase: 52 U/L (ref 38–126)
Anion gap: 11 (ref 5–15)
BUN: 17 mg/dL (ref 6–20)
CO2: 24 mmol/L (ref 22–32)
Calcium: 8.5 mg/dL — ABNORMAL LOW (ref 8.9–10.3)
Chloride: 101 mmol/L (ref 98–111)
Creatinine, Ser: 1.03 mg/dL — ABNORMAL HIGH (ref 0.44–1.00)
GFR, Estimated: >60 mL/min (ref >60)
Glucose, Bld: 143 mg/dL — ABNORMAL HIGH (ref 70–99)
Potassium: 3.9 mmol/L (ref 3.5–5.1)
Sodium: 136 mmol/L (ref 135–145)
Total Bilirubin: 0.6 mg/dL (ref 0.3–1.2)
Total Protein: 5.3 g/dL — ABNORMAL LOW (ref 6.5–8.1)

## 2022-07-25 LAB — CBC WITH DIFFERENTIAL/PLATELET
Abs Immature Granulocytes: 0.05 10*3/uL (ref 0.00–0.07)
Basophils Absolute: 0 10*3/uL (ref 0.0–0.1)
Basophils Relative: 0 %
Eosinophils Absolute: 0.3 10*3/uL (ref 0.0–0.5)
Eosinophils Relative: 4 %
HCT: 27.5 % — ABNORMAL LOW (ref 36.0–46.0)
Hemoglobin: 9.4 g/dL — ABNORMAL LOW (ref 12.0–15.0)
Immature Granulocytes: 1 %
Lymphocytes Relative: 19 %
Lymphs Abs: 1.3 10*3/uL (ref 0.7–4.0)
MCH: 32.6 pg (ref 26.0–34.0)
MCHC: 34.2 g/dL (ref 30.0–36.0)
MCV: 95.5 fL (ref 80.0–100.0)
Monocytes Absolute: 0.7 10*3/uL (ref 0.1–1.0)
Monocytes Relative: 10 %
Neutro Abs: 4.6 10*3/uL (ref 1.7–7.7)
Neutrophils Relative %: 66 %
Platelets: 255 10*3/uL (ref 150–400)
RBC: 2.88 MIL/uL — ABNORMAL LOW (ref 3.87–5.11)
RDW: 12.1 % (ref 11.5–15.5)
WBC: 6.9 10*3/uL (ref 4.0–10.5)
nRBC: 0 % (ref 0.0–0.2)

## 2022-07-25 LAB — GLUCOSE, CAPILLARY
Glucose-Capillary: 138 mg/dL — ABNORMAL HIGH (ref 70–99)
Glucose-Capillary: 257 mg/dL — ABNORMAL HIGH (ref 70–99)
Glucose-Capillary: 144 mg/dL — ABNORMAL HIGH (ref 70–99)
Glucose-Capillary: 188 mg/dL — ABNORMAL HIGH (ref 70–99)

## 2022-07-25 MED ORDER — GLIMEPIRIDE 1 MG PO TABS: 1.0000 mg | ORAL_TABLET | ORAL | 11 refills | Status: DC

## 2022-07-25 MED ORDER — TRULICITY 1.5 MG/0.5ML ~~LOC~~ SOAJ: 1.5000 mg | SUBCUTANEOUS | 0 refills | Status: DC

## 2022-07-25 MED ORDER — TRAZODONE HCL 50 MG PO TABS
150.0000 mg | ORAL_TABLET | Freq: Every evening | ORAL | Status: DC | PRN
  Administered 2022-07-30: 150 mg via ORAL
  Filled 2022-07-25: qty 3

## 2022-07-25 MED ORDER — GLIMEPIRIDE 1 MG PO TABS
1.0000 mg | ORAL_TABLET | Freq: Every day | ORAL | Status: DC
  Administered 2022-07-26 – 2022-07-31 (×6): 1 mg via ORAL
  Filled 2022-07-25 (×6): qty 1

## 2022-07-25 MED ORDER — SORBITOL 70 % SOLN
45.0000 mL | Freq: Once | Status: AC
  Administered 2022-07-25: 45 mL via ORAL

## 2022-07-25 NOTE — IPOC Note (Signed)
Overall Plan of Care [facility]) Patient Details Name: Alexandria Webster MRN: [id number] DOB: 1965/11/22  Admitting Diagnosis: Cervical myelopathy [facility])  Hospital Problems: Principal Problem:   Cervical myelopathy (HCC)     Functional Problem List: Nursing Bladder, Bowel, Safety, Sensory, Skin Integrity, Endurance, Medication Management, Pain  PT Balance, Endurance, Motor, Pain, Perception, Safety, Sensory  OT Balance, Cognition, Endurance, Motor, Pain, Perception, Sensory, Vision, Skin Integrity  SLP    TR         Basic ADL's: OT Eating, Grooming, Bathing, Dressing, Toileting     Advanced  ADL's: OT       Transfers: PT Bed Mobility, Bed to Chair, Customer service manager, Tub/Shower     Locomotion: PT Ambulation, Stairs     Additional Impairments: OT Fuctional Use of Upper Extremity (BUE)  SLP        TR      Anticipated Outcomes Item Anticipated Outcome  Self Feeding Set up A  Swallowing      Basic self-care  Min A  Toileting  Min A   Bathroom Transfers Supervision  Bowel/Bladder  continent B/B  Transfers  supervision  Locomotion  min assist  Communication     Cognition     Pain  les than 4  Safety/Judgment  no falls   Therapy Plan: PT Intensity: Minimum of 1-2 x/day ,45 to 90 minutes PT Frequency: Total of 15 hours over 7 days of combined therapies PT Duration Estimated Length of Stay: 2-2.5 weeks OT Intensity: Minimum of 1-2 x/day, 45 to 90 minutes OT Frequency: 5 out of 7 days OT Duration/Estimated Length of Stay: 2-2.5 weeks     Team Interventions: Nursing Interventions Patient/Family Education, Pain Management, Bladder Management, Medication Management, Discharge Planning, Bowel Management, Skin Care/Wound Management, Psychosocial Support, Disease Management/Prevention  PT interventions Balance/vestibular training, Ambulation/gait training, Community reintegration, DME/adaptive equipment instruction, Neuromuscular re-education, UE/LE Strength  taining/ROM, Stair training, Discharge planning, Functional electrical stimulation, Pain management, Therapeutic Activities, UE/LE Coordination activities, Disease management/prevention, Functional mobility training, Patient/family education, Therapeutic Exercise, Visual/perceptual remediation/compensation  OT Interventions Balance/vestibular training, Discharge planning, Pain management, Self Care/advanced ADL retraining, Therapeutic Activities, UE/LE Coordination activities, Cognitive remediation/compensation, Disease mangement/prevention, Functional mobility training, Patient/family education, Skin care/wound managment, Therapeutic Exercise, Visual/perceptual remediation/compensation, DME/adaptive equipment instruction, Neuromuscular re-education, Psychosocial support, Splinting/orthotics, UE/LE Strength taining/ROM, Wheelchair propulsion/positioning  SLP Interventions    TR Interventions    SW/CM Interventions Discharge Planning, Psychosocial Support, Patient/Family Education   Barriers to Discharge MD  Medical stability, Home enviroment access/loayout, Neurogenic bowel and bladder, Wound care, Lack of/limited family support, Weight, Weight bearing restrictions, and Behavior  Nursing Decreased caregiver support, Home environment access/layout, New diabetic, Incontinence, Wound Care 1 level 5 ste entry with bilateral HR  PT Inaccessible home environment, Home environment access/layout 5 STE  OT Home environment access/layout, Neurogenic Bowel & Bladder, Nutrition means    SLP      SW Decreased caregiver support, Lack of/limited family support, Community education officer for SNF coverage, Home environment access/layout     Team Discharge Planning: Destination: PT-Home ,OT- Home , SLP-  Projected Follow-up: PT-Home health PT, OT-  Home health OT, 24 hour supervision/assistance, SLP-  Projected Equipment Needs: PT-To be determined, OT- To be determined, SLP-  Equipment Details: PT-need to confirm equipment  with family, may have WC and RW, OT-  Patient/family involved in discharge planning: PT- Patient,  OT-Patient, SLP-   MD ELOS: 2-2.5 weeks Medical Rehab Prognosis:  Good Assessment: The patient has been admitted for CIR therapies with the diagnosis  of cervical myelopathy. The team will be addressing functional mobility, strength, stamina, balance, safety, adaptive techniques and equipment, self-care, bowel and bladder mgt, patient and caregiver education, medical care. Goals have been set at min A. Anticipated discharge destination is home with family. .        See Team Conference Notes for weekly updates to the plan of care

## 2022-07-25 NOTE — Progress Notes (Signed)
Inpatient Rehabilitation  Patient information reviewed and entered into eRehab system by Manuel Dall M. Fleeta Kunde, M.A., CCC/SLP, PPS Coordinator.  Information including medical coding, functional ability and quality indicators will be reviewed and updated through discharge.    

## 2022-07-25 NOTE — Care Management (Signed)
Inpatient Rehabilitation Center Individual Statement of Services  Patient Name:  Alexandria Webster  Date:  07/25/2022  Welcome to the Inpatient Rehabilitation [facility].  Our goal is to provide you with an individualized program based on your diagnosis and situation, designed to meet your specific needs.  With this comprehensive rehabilitation program, you will be expected to participate in at least 3 hours of rehabilitation therapies Monday-Friday, with modified therapy programming on the weekends.  Your rehabilitation program will include the following services:  Physical Therapy (PT), Occupational Therapy (OT), 24 hour per day rehabilitation nursing, Therapeutic Recreaction (TR), Psychology, Neuropsychology, Care Coordinator, Rehabilitation Medicine, Nutrition Services, Pharmacy Services, and Other  Weekly team conferences will be held on Tuesdays to discuss your progress.  Your Inpatient Rehabilitation Care Coordinator will talk with you frequently to get your input and to update you on team discussions.  Team conferences with you and your family in attendance may also be held.  Expected length of stay: 2-2.5 weeks    Overall anticipated outcome: Supervision to Minimal Assistance  Depending on your progress and recovery, your program may change. Your Inpatient Rehabilitation Care Coordinator will coordinate services and will keep you informed of any changes. Your Inpatient Rehabilitation Care Coordinator's name and contact numbers are listed  below.  The following services may also be recommended but are not provided by the Inpatient Rehabilitation Center:  Driving Evaluations Home Health Rehabiltiation Services Outpatient Rehabilitation Services Vocational Rehabilitation   Arrangements will be made to provide these services after discharge if needed.  Arrangements include referral to agencies that provide these services.  Your insurance has been verified to be:  [employer]  Your  primary doctor is:  Assunta Found  Pertinent information will be shared with your doctor and your insurance company.  Inpatient Rehabilitation Care Coordinator:  Susie Cassette [phone] or [phone]  Information discussed with and copy given to patient by: Gretchen Short, 07/25/2022, 9:10 AM

## 2022-07-25 NOTE — Progress Notes (Signed)
PROGRESS NOTE   Subjective/Complaints:  Pt reports LBM yesterday, however last documented BM was 7+ days ago.  Pt reports pain 4/10- feels pretty good- no pain meds overnight.  Per nursing, pt wanted to go AMA overnight and was very confused.   Refused trazodone.  Hx of gastroparesis.    ROS: limited by cognition   Objective:   No results found. Recent Labs    07/25/22 1040  WBC 6.9  HGB 9.4*  HCT 27.5*  PLT 255   Recent Labs    07/25/22 0611  NA 136  K 3.9  CL 101  CO2 24  GLUCOSE 143*  BUN 17  CREATININE 1.03*  CALCIUM 8.5*    Intake/Output Summary (Last 24 hours) at 07/25/2022 1459 Last data filed at 07/25/2022 0847 Gross per 24 hour  Intake 238 ml  Output --  Net 238 ml        Physical Exam: Vital Signs Blood pressure 104/63, pulse 93, temperature 98.7 F (37.1 C), temperature source Oral, resp. rate 18, height 5\' 4"  (1.626 m), weight 53.6 kg, last menstrual period 01/21/2012, SpO2 99 %.    General: awake, alert, calm; supine in bed; wearing soft collar;  NAD HENT: conjugate gaze; oropharynx moist CV: regular rate and rhythm; no JVD Pulmonary: CTA B/L; no W/R/R- good air movement GI: soft, NT; distended moderately; hypoactive BS Psychiatric: appropriate but flat Neurological: alert- poor memory Skin: C/D/I. No apparent lesions.  Posterior neck surgical site covered in clean honeycomb dressing. MSK:      No apparent deformity.      Strength:                RUE: 4+ to 5-/5 throughout                LUE: 4/5 SA, 4-/5 EF, 4-/5 EE, 3+/5 WE, 3/5 FF, 3/5 FA                 RLE: 4 out of 5 throughout                LLE: 4 of 5 throughout   Neurologic exam:  Cognition: AAO to person, place, time and event.  Language: Fluent, No substitutions or neoglisms. No dysarthria. Names 3/3 objects correctly.  Memory: Recalls 3/3 objects at 5 minutes. No apparent deficits  Insight: Good insight into  current condition.  Mood: Pleasant affect, appropriate mood.  Sensation: To light touch intact in BL UEs and LEs  Reflexes: Hyporeflexic in bilateral upper extremities and right lower extremity; hyperreflexic at left patella. Negative Hoffman's and babinski signs bilaterally.  CN: 2-12 grossly intact.  Coordination: Fine motor coordination impaired and ataxia in left upper extremity Spasticity: MAS 0 in all extremities.    Assessment/Plan: 1. Functional deficits which require 3+ hours per day of interdisciplinary therapy in a comprehensive inpatient rehab setting. Physiatrist is providing close team supervision and 24 hour management of active medical problems listed below. Physiatrist and rehab team continue to assess barriers to discharge/monitor patient progress toward functional and medical goals  Care Tool:  Bathing    Body parts bathed by patient: Right arm, Left arm, Chest, Abdomen, Face  Body parts bathed by helper: Front perineal area, Buttocks, Right upper leg, Left upper leg, Right lower leg, Left lower leg     Bathing assist Assist Level: Maximal Assistance - Patient 24 - 49%     Upper Body Dressing/Undressing Upper body dressing   What is the patient wearing?: Pull over shirt, Orthosis (cervical collar) Orthosis activity level: Performed by helper  Upper body assist Assist Level: Maximal Assistance - Patient 25 - 49%    Lower Body Dressing/Undressing Lower body dressing      What is the patient wearing?: Pants     Lower body assist Assist for lower body dressing: Maximal Assistance - Patient 25 - 49%     Toileting Toileting Toileting Activity did not occur (Clothing management and hygiene only): Refused  Toileting assist Assist for toileting: Total Assistance - Patient < 25%     Transfers Chair/bed transfer  Transfers assist  Chair/bed transfer activity did not occur: Safety/medical concerns (orthostatic hypotension with sit<>stand)         Locomotion Ambulation   Ambulation assist   Ambulation activity did not occur: Safety/medical concerns (orthostatic hypotension with sit<>stand)          Walk 10 feet activity   Assist  Walk 10 feet activity did not occur: Safety/medical concerns        Walk 50 feet activity   Assist Walk 50 feet with 2 turns activity did not occur: Safety/medical concerns         Walk 150 feet activity   Assist Walk 150 feet activity did not occur: Safety/medical concerns         Walk 10 feet on uneven surface  activity   Assist Walk 10 feet on uneven surfaces activity did not occur: Safety/medical concerns         Wheelchair     Assist Is the patient using a wheelchair?: Yes Type of Wheelchair: Manual Wheelchair activity did not occur: Safety/medical concerns (orthostatic hypotension with sit<>stand)         Wheelchair 50 feet with 2 turns activity    Assist    Wheelchair 50 feet with 2 turns activity did not occur: Safety/medical concerns       Wheelchair 150 feet activity     Assist  Wheelchair 150 feet activity did not occur: Safety/medical concerns       Blood pressure 104/63, pulse 93, temperature 98.7 F (37.1 C), temperature source Oral, resp. rate 18, height 5\' 4"  (1.626 m), weight 53.6 kg, last menstrual period 01/21/2012, SpO2 99 %.  Medical Problem List and Plan: 1. Functional deficits secondary to cervical myelopathy s/p C5, 6, 7, T1 decompressive laminotomy with P CDF 07/19/2022 per Dr. Dutch Quint.               - Soft cervical collar when out of bed; can be offered bathroom trips at night.             -patient may shower with surgical site covered             -ELOS/Goals: 7 to 10 days, PT/OT mod I to supervision level  Con't CIR PT and OT IPOC done 2.  Antithrombotics: -DVT/anticoagulation:  Mechanical:  Antiembolism stockings, knee (TED hose) Bilateral lower extremities.  Check vascular study             -antiplatelet therapy:  Aspirin 325 mg daily 3. Pain Management: Hydrocodone as needed for pain, Valium as needed for muscle spasms  5/13- pain controlled with no meds-  con't regimen 4. Mood/Behavior/Sleep: Trazodone 150 mg nightly.  Provide emotional support  5/13- refused trazodone, will make prn             -antipsychotic agents: N/A 5. Neuropsych/cognition: This patient is capable of making decisions on her own behalf. 6. Skin/Wound Care: Routine skin checks 7. Fluids/Electrolytes/Nutrition: Routine in and outs with follow-up chemistries 8.  COPD/quit smoking 4 years ago.  Continue albuterol as directed 9.  Hyperlipidemia.  Pravachol 10.  Hypertension.  Continue Lisinopril 5 mg daily, Lasix 20 mg daily         --Blood pressure soft 80s over 60s on admission; appears to have been intermittently hypotensive throughout her stay.  Receiving Lasix approximately every other day.  Will DC for now, start daily weights.   5/11 weight reviewed and is 54.3kg  5/13- BP doing better- 100's systolic with TEDs- been running low- held lisinopril today- -reduced lisinopril to 2.5 mg daily  11.  Diabetes mellitus.  Latest hemoglobin A1c 9.3.  Currently SSI, continue  5.13- restarted Amaryl- wasn't taking at home.  12.  Hypothyroidism. Continue Synthroid 13.  Constipation. Continue Colace 100 mg daily, Dulcolax suppository daily as needed, MiraLAX daily as needed  5.13- sorbitol 45cc- if no results, wrote for Soap suds enema.     I spent a total of 50   minutes on total care today- >50% coordination of care- due to  Doing IPOC, d/w nursing about pt's confusion; as well as LBM 7+ days ago; and that pt supposedly confused. Also d/w nursing about low BP and PA abouyt BG's  LOS: 3 days A FACE TO FACE EVALUATION WAS PERFORMED  Dayjah Selman 07/25/2022, 2:59 PM

## 2022-07-25 NOTE — Progress Notes (Addendum)
Patient ID: Alexandria Webster, female   DOB: 1965/09/21, 57 y.o.   MRN: [id number]  [id number] left message for [name] to introduce self, explain role, discuss discharge process, and inform on ELOS. SW requested return phone call to discuss discharge plan.   *SW received return phone call from [name]. SW informed on above. SW will follow-up with updates after team conference.   Cecile Sheerer, MSW, LCSWA Office: [phone] Cell: [phone] Fax: [phone]

## 2022-07-25 NOTE — Progress Notes (Signed)
Physical Therapy Session Note  Patient Details  Name: Alexandria Webster MRN: [id number] Date of Birth: 1965-09-28  Today's Date: 07/25/2022 PT Individual Time: 4098-1191, 1415-1530 PT Individual Time Calculation (min): 45 min, 75 min   Short Term Goals: Week 1:  PT Short Term Goal 1 (Week 1): Pt will complete bed mobility with min assist PT Short Term Goal 2 (Week 1): Pt will complete stand pivot transfers with LRAD min assist PT Short Term Goal 3 (Week 1): Pt will initiate gait training PT Short Term Goal 4 (Week 1): Pt will tolerate sitting upright for 5 minutes without orthostasis  Skilled Therapeutic Interventions/Progress Updates:    Session 1: Pt received in bed sleeping. Roused with verbal cues, but drowsy and eyes closed for much of session. Nsg reports pt did not sleep overnight d/t sundowning. Pt has been hypotensive so vitals taken throughout as documented below. Pt reports neck hurts "a little but not a whole lot." Rest and positioning as needed. Therapist opened blinds and turned on lights as well as maintaining pt in upright position for optimum arousal, but pt still drowsy.  Pt performed LAQ  and ankle pumps to improve BP via muscle pump action. Pt was set up with breakfast and provided education on positioning for arousal and taking medications as directed by medical team. Pt was left with all needs in reach and alarm active.   Session 2: Pt received in recliner and agreeable to therapy.  Pt reported neck pain during session, unrated, addressed with supine manual therapy for triggerpoint release. Pt reported improvement in symptoms. BP monitored as documented below. Although demoing orthostatic hypotension, pt did not report symptoms that aligned with orthostasis, but with vertigo. Pt had been taking meclizine at home for premorbid vertigo, but did not realize she needed to ask for PRN medication. Nsg provided during session. Minimized standing mobility during session d/t OH. Pt  performed CGA Stand pivot transfer x 3 with RW. Retrieved youth RW for more appropriate height to minimize kyphosis. Pt transported to therapy gym for time management and energy conservation. Attempted dix hallpike testing, but difficulty getting accurate results d/t cervical precautions. Unclear positive on R side, but no nystagmus or other symptoms noted. Increase in dizziness and nausea on sitting from L sidelying, but unable to reproduce. Pt with difficulty performing occulomotor testing d/t visual deficits which pt reports are premorbid and related to DM. Pt returned to room after session and to bed with supervision bed mobility, was left with all needs in reach and alarm active.    Therapy Documentation Precautions:  Precautions Precautions: Fall, Cervical Required Braces or Orthoses: Cervical Brace Cervical Brace: Soft collar Other Brace: can be off in bed, can be off for bathroom trips at night Restrictions Weight Bearing Restrictions: No General:   Vital Signs: Session 1: Supine: 88/42(58) Supine+ teds: 99/52 (67) Chair position + LE exercises 102/60 (71)  Session 2: Seated 114/69 (84) and 107/66 (80) HR=97 Standing 89/72(79) HR=122 but with minimal OH symptoms.      Therapy/Group: Individual Therapy  Juluis Rainier 07/25/2022, 8:25 AM

## 2022-07-25 NOTE — Progress Notes (Signed)
Spoke with PCP Dr. Assunta Found [phone] in regards to patient's diabetes mellitus.  She was on Amaryl 1 mg daily as well as Trulicity 1.5 mg weekly prior to admission but she had not refilled her medications recently.  Most recent office hemoglobin A1c of 6.3 now increased to 9.3.  Will resume Amaryl all.  Trulicity not on formulary so we will continue with sliding scale and resume upon discharge and follow-up with PCP.

## 2022-07-25 NOTE — Progress Notes (Signed)
Inpatient Rehabilitation Care Coordinator Assessment and Plan Patient Details  Name: Alexandria Webster MRN: [id number] Date of Birth: 01/31/1966  Today's Date: 07/25/2022  Hospital Problems: Principal Problem:   Cervical myelopathy [facility])  Past Medical History:  Past Medical History:  Diagnosis Date   Asthma    COPD (chronic obstructive pulmonary disease) (HCC)    DDD (degenerative disc disease), lumbar    Depression    Diabetes mellitus    Gastroparesis    GERD without esophagitis    High cholesterol    Hypertension    Hypothyroidism    PONV (postoperative nausea and vomiting)    PVD (peripheral vascular disease) (HCC)    Wears glasses    Past Surgical History:  Past Surgical History:  Procedure Laterality Date   AORTA - BILATERAL FEMORAL ARTERY BYPASS GRAFT N/A 09/27/2018   Procedure: Redo Exposure  AORTOBIFEMORAL Bypass and bilateral femoral Artery ; Redo Aortobifemoral  BYPASS GRAFT.;  Surgeon: Cephus Shelling, MD;  Location[facility] OR;  Service: Vascular;  Laterality: N/A;   BACK SURGERY     BIOPSY  10/14/2021   Procedure: BIOPSY;  Surgeon: Lanelle Bal, DO;  Location: [facility] ENDO SUITE;  Service: Endoscopy;;   COLONOSCOPY WITH PROPOFOL N/A 10/14/2021   Procedure: COLONOSCOPY WITH PROPOFOL;  Surgeon: Lanelle Bal, DO;  Location: [facility] ENDO SUITE;  Service: Endoscopy;  Laterality: N/A;  8:30am   ESOPHAGOGASTRODUODENOSCOPY (EGD) WITH PROPOFOL N/A 10/14/2021   Procedure: ESOPHAGOGASTRODUODENOSCOPY (EGD) WITH PROPOFOL;  Surgeon: Lanelle Bal, DO;  Location: [facility] SUITE;  Service: Endoscopy;  Laterality: N/A;   FEMORAL ARTERY STENT  right leg   POSTERIOR CERVICAL FUSION/FORAMINOTOMY N/A 07/19/2022   Procedure: Posterior cervical fusion with lateral mass fixation - Cervical four-cervical five, Cervical five-Cervical six - Cervical six-Cervical seven - Cervical seven-Thoracic one with laminectomy;  Surgeon: Julio Sicks, MD;  Location: [facility] OR;  Service: Neurosurgery;   Laterality: N/A;   TUBAL LIGATION     Social History:  reports that she quit smoking about 4 years ago. Her smoking use included cigarettes. She has been exposed to tobacco smoke. Her smokeless tobacco use includes snuff. She reports that she does not drink alcohol and does not use drugs.  Family / Support Systems Marital Status: Married How Long?: Pt reports she is verbally seperated at this time. Patient Roles: Spouse Spouse/Significant Other: Alexandria Webster (husband) Children: Alexandria Webster Other Supports: none reports Anticipated Caregiver: [name] Ability/Limitations of Caregiver: No limitations or concerns reported. Pt plans to discharge to her daughter;s home until she is well enough ot return to her own home. Caregiver Availability: 24/7 Family Dynamics: Pt lives alone.  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 States she has not working in 10+ years. Education: Between 4th-5th grade Health Literacy - How often do you need to have someone help you when you read instructions, pamphlets, or other written material from your doctor or pharmacy?: Never Writes: Yes Employment Status: Disabled Marine scientist Issues: Denies Guardian/Conservator: N/A   Abuse/Neglect Abuse/Neglect Assessment Can Be Completed: Yes Physical Abuse: Denies Verbal Abuse: Denies Sexual Abuse: Denies Exploitation of patient/patient's resources: Denies Self-Neglect: Denies  Patient response to: Social Isolation - How often do you feel lonely or isolated from those around you?: Never  Emotional Status Pt's affect, behavior and adjustment status: Pt in good spirits at time of visit. Would  like to know if she will get better. Recent Psychosocial Issues: Denies Psychiatric History: Pt admits to taking medication for depression; trazadone   prescribed by PCP. Substance Abuse History: Pt admits she quit smoking cigarettes 15 yrs ago. Denies etoh use or rec drug use.  Patient / Family Perceptions, Expectations & Goals Pt/Family understanding of illness & functional limitations: Pt and dtr have a general understanding of pt care needs Premorbid pt/family roles/activities: Independent Anticipated changes in roles/activities/participation: Assistance with ADLs/IADLs Pt/family expectations/goals: Pt goal is to work on getting back to walking and driving.  Community Resources [employer]: None Premorbid Home Care/DME Agencies: None Transportation available at discharge: TBD Is the patient able to respond to transportation needs?: Yes In the past 12 months, has lack of transportation kept you from medical appointments or from getting medications?: No In the past 12 months, has lack of transportation kept you from meetings, work, or from getting things needed for daily living?: No Resource referrals recommended: Neuropsychology  Discharge Planning Living Arrangements: Children Support Systems: Children Type of Residence: Private residence Insurance Resources: Media planner (specify) Administrator [employer] [facility]) Financial Resources: SSD Financial Screen Referred: No Living Expenses: Own Money Management: Patient Does the patient have any problems obtaining your medications?: No Home Management: Pt manages her own homecare needs. Patient/Family Preliminary Plans: Dtr will assist in her home. Care Coordinator Barriers to Discharge: Decreased caregiver support, Lack of/limited family support, Insurance for SNF coverage, Home environment access/layout Care Coordinator Anticipated Follow Up Needs: HH/OP Expected length of stay: 2-2.5 weeks  Clinical Impression SW met with pt in room while she was sitting in her w/c. SW introduced self, explained role, discussed d/c process, and informed on ELOS. Discharging to her daughter's home  ([address], [place], [place]); [address] STE with walkin shower. Pt is not a Cytogeneticist. DME: 2 SPC, 1 quad cane, w/c, and pedal exerciser.   Alexandria Webster 07/25/2022, 12:04 PM

## 2022-07-25 NOTE — Inpatient Diabetes Management (Signed)
Inpatient Diabetes Program Recommendations  AACE/ADA: New Consensus Statement on Inpatient Glycemic Control (2015)  Target Ranges:  Prepandial:   less than 140 mg/dL      Peak postprandial:   less than 180 mg/dL (1-2 hours)      Critically ill patients:  140 - 180 mg/dL   Lab Results  Component Value Date   GLUCAP 257 (H) 07/25/2022   HGBA1C 9.3 (H) 07/14/2022    Diabetes history: DM2 Outpatient Diabetes medications: Amaryl 1 mg qd, Trulicity 1.2 mg q week Current orders for Inpatient glycemic control: Novolog 0-15 units tid, Amaryl 1 mg qd  Inpatient Diabetes Program Recommendations:    Received consult regarding diabetes management. Spoke with pt about A1C results 9.3 (average blood glucose 220 and explained what an A1C is, basic pathophysiology of DM Type 2, basic home care, basic diabetes diet nutrition principles, importance of checking CBGs and maintaining good CBG control to prevent long-term and short-term complications. Reviewed signs and symptoms of hyperglycemia and hypoglycemia and how to treat hypoglycemia at home. Also reviewed blood sugar goals at home.  RNs to provide ongoing basic DM education at bedside with this patient.  Reviewed basic plate method diet guidelines. Patient shared that her daughter has been cooking meals and she is willing to review amount of carbohydrates she is eating.  Thank you, Billy Fischer. Hailly Fess, RN, MSN, CDE  Diabetes Coordinator Inpatient Glycemic Control Team Team Pager [phone] (8am-5pm) 07/25/2022 2:34 PM

## 2022-07-25 NOTE — Progress Notes (Signed)
UA collected at 1515 and sent to labs per order.   Patient is in bed, eating and call-light within reach. Other safety measures implemented.

## 2022-07-25 NOTE — Progress Notes (Addendum)
Went to give patient PRN medication for constipation and recheck CBG. Unknown when patient last BM was. History of gastroparesis due to DM history. Rounding on her is asleep at this time. Once awake see if patient would be accepting to take PRN medication for constipation and complete an additional GI assessment.

## 2022-07-25 NOTE — Progress Notes (Signed)
Occupational Therapy Session Note  Patient Details  Name: Alexandria Webster MRN: [id number] Date of Birth: 1965-05-15  Today's Date: 07/25/2022 OT Individual Time: 7829-5621 OT Individual Time Calculation (min): 73 min    Short Term Goals: Week 1:  OT Short Term Goal 1 (Week 1): Pt will complete sit > stand in prep for ADL with Min A using LRAD OT Short Term Goal 2 (Week 1): Pt will complete toilet transfer with min A using LRAD to promote OOB toileting OT Short Term Goal 3 (Week 1): Pt will complete thread BLE into LB clothing using AE PRN with CGA for sitting balance OT Short Term Goal 4 (Week 1): Pt will demonstrate improved recall of cervical precautions for 2 consecutive session with supervision A  Skilled Therapeutic Interventions/Progress Updates:    Patient agreeable to participate in OT session. Reports increased pain in her head/neck caused by poor neck support. Provided repositioning of cervical collar and pillow with pt verbalizing improvement. Pt also requested pain meds from nursing.   Patient participated in skilled OT session focusing on ADL re-training, functional transfers, and activity tolerance.  BP readings during session: Supine 89/49 (63) HR 100 Seated 82/92 (52) HR 105 Pt's BP did not demonstrate orthostatic concerns although pt verbalized dizziness and neck pain when sitting on EOB. Bed level ADL completed due to pt's pain level and decreased activity tolerance. Pt was initially alert and awake at start of session although became more fatigued as session progressed requiring frequent rest breaks.   Mod A provided for LB bathing at bed level. Max A provided for LB dressing. Pt assisted with pulling pants up over bilateral hips by rolling side to side when provided with VC for technique.  Transitioned from supine to sitting on EOB with Min A and increased time. Pt completed UB bathing with SBA and UB dressing with Min A due to cervical precautions.  Pt was assisted to  complete functional stand pvt transfer from bed to recliner. 1 person face to face technique used with VC for hand placement and sequencing provided. Proper positioning provided with use of pillows under BUE, bilateral knees and a bath blanket rolled behind head for additional neck support.       Therapy Documentation Precautions:  Precautions Precautions: Fall, Cervical Required Braces or Orthoses: Cervical Brace Cervical Brace: Soft collar Other Brace: can be off in bed, can be off for bathroom trips at night Restrictions Weight Bearing Restrictions: No  Therapy/Group: Individual Therapy  Limmie Patricia, OTR/L,CBIS  Supplemental OT - [facility] and [facility] Secure Chat Preferred   07/25/2022, 8:01 AM

## 2022-07-26 DIAGNOSIS — G959 Disease of spinal cord, unspecified: Secondary | ICD-10-CM | POA: Diagnosis not present

## 2022-07-26 LAB — GLUCOSE, CAPILLARY
Glucose-Capillary: 141 mg/dL — ABNORMAL HIGH (ref 70–99)
Glucose-Capillary: 100 mg/dL — ABNORMAL HIGH (ref 70–99)
Glucose-Capillary: 175 mg/dL — ABNORMAL HIGH (ref 70–99)
Glucose-Capillary: 131 mg/dL — ABNORMAL HIGH (ref 70–99)

## 2022-07-26 MED ORDER — ENOXAPARIN SODIUM 40 MG/0.4ML IJ SOSY
40.0000 mg | PREFILLED_SYRINGE | INTRAMUSCULAR | Status: DC
  Administered 2022-07-26 – 2022-08-05 (×11): 40 mg via SUBCUTANEOUS
  Filled 2022-07-26 (×11): qty 0.4

## 2022-07-26 NOTE — Progress Notes (Signed)
PROGRESS NOTE   Subjective/Complaints:  Pt reports had low grade fever this AM- of 100 degrees- per chart, pt right.   Pt reports that feels dizzy/not good when sits up- and that's how she's feeling bad.   Pt admits to drinking 2-4 cups/day at most.    ROS:   Pt denies SOB, abd pain, CP, N/V/C/D, and vision changes Except for HPI   Objective:   DG CHEST PORT 1 VIEW  Result Date: 07/25/2022 CLINICAL DATA:  Delirium. EXAM: PORTABLE CHEST 1 VIEW COMPARISON:  X-ray 10/12/2018 and older FINDINGS: No consolidation, pneumothorax or effusion. Slight linear opacity of the left lung base likely scar or atelectasis. Normal cardiopericardial silhouette. Calcified aorta. Degenerative changes of the spine. Osteopenia. Fixation hardware seen along the cervical spine at the edge of the imaging field. IMPRESSION: Minimal left basilar scar or atelectasis. No consolidation or effusion. Electronically Signed   By: Karen Kays M.D.   On: 07/25/2022 15:40   Recent Labs    07/25/22 1040  WBC 6.9  HGB 9.4*  HCT 27.5*  PLT 255   Recent Labs    07/25/22 0611  NA 136  K 3.9  CL 101  CO2 24  GLUCOSE 143*  BUN 17  CREATININE 1.03*  CALCIUM 8.5*    Intake/Output Summary (Last 24 hours) at 07/26/2022 0981 Last data filed at 07/26/2022 1914 Gross per 24 hour  Intake 457 ml  Output 400 ml  Net 57 ml        Physical Exam: Vital Signs Blood pressure (!) 123/53, pulse 97, temperature 100 F (37.8 C), temperature source Oral, resp. rate 18, height 5\' 4"  (1.626 m), weight 51.7 kg, last menstrual period 01/21/2012, SpO2 97 %.     General: awake, alert, appropriate, sitting up slightly in bed; NAD HENT: conjugate gaze; oropharynx moist-wearing cervical collar CV: regular to almost tachycardic rate; no JVD Pulmonary: CTA B/L; no W/R/R- good air movement GI: soft, NT, ND, (+)BS- hypoactive Psychiatric: appropriate- flat Neurological:  Ox3 with cues this AM- better Skin: C/D/I. No apparent lesions.  Posterior neck surgical site covered in clean honeycomb dressing. Some tenting on hand MSK:      No apparent deformity.      Strength:                RUE: 4+ to 5-/5 throughout                LUE: 4/5 SA, 4-/5 EF, 4-/5 EE, 3+/5 WE, 3/5 FF, 3/5 FA                 RLE: 4 out of 5 throughout                LLE: 4 of 5 throughout   Neurologic exam:  Cognition: AAO to person, place, time and event.  Language: Fluent, No substitutions or neoglisms. No dysarthria. Names 3/3 objects correctly.  Memory: Recalls 3/3 objects at 5 minutes. No apparent deficits  Insight: Good insight into current condition.  Mood: Pleasant affect, appropriate mood.  Sensation: To light touch intact in BL UEs and LEs  Reflexes: Hyporeflexic in bilateral upper extremities and right lower extremity; hyperreflexic  at left patella. Negative Hoffman's and babinski signs bilaterally.  CN: 2-12 grossly intact.  Coordination: Fine motor coordination impaired and ataxia in left upper extremity Spasticity: MAS 0 in all extremities.    Assessment/Plan: 1. Functional deficits which require 3+ hours per day of interdisciplinary therapy in a comprehensive inpatient rehab setting. Physiatrist is providing close team supervision and 24 hour management of active medical problems listed below. Physiatrist and rehab team continue to assess barriers to discharge/monitor patient progress toward functional and medical goals  Care Tool:  Bathing    Body parts bathed by patient: Right arm, Left arm, Chest, Abdomen, Face   Body parts bathed by helper: Front perineal area, Buttocks, Right upper leg, Left upper leg, Right lower leg, Left lower leg     Bathing assist Assist Level: Maximal Assistance - Patient 24 - 49%     Upper Body Dressing/Undressing Upper body dressing   What is the patient wearing?: Pull over shirt, Orthosis (cervical collar) Orthosis activity  level: Performed by helper  Upper body assist Assist Level: Maximal Assistance - Patient 25 - 49%    Lower Body Dressing/Undressing Lower body dressing      What is the patient wearing?: Pants     Lower body assist Assist for lower body dressing: Maximal Assistance - Patient 25 - 49%     Toileting Toileting Toileting Activity did not occur (Clothing management and hygiene only): Refused  Toileting assist Assist for toileting: Total Assistance - Patient < 25%     Transfers Chair/bed transfer  Transfers assist  Chair/bed transfer activity did not occur: Safety/medical concerns (orthostatic hypotension with sit<>stand)        Locomotion Ambulation   Ambulation assist   Ambulation activity did not occur: Safety/medical concerns (orthostatic hypotension with sit<>stand)          Walk 10 feet activity   Assist  Walk 10 feet activity did not occur: Safety/medical concerns        Walk 50 feet activity   Assist Walk 50 feet with 2 turns activity did not occur: Safety/medical concerns         Walk 150 feet activity   Assist Walk 150 feet activity did not occur: Safety/medical concerns         Walk 10 feet on uneven surface  activity   Assist Walk 10 feet on uneven surfaces activity did not occur: Safety/medical concerns         Wheelchair     Assist Is the patient using a wheelchair?: Yes Type of Wheelchair: Manual Wheelchair activity did not occur: Safety/medical concerns (orthostatic hypotension with sit<>stand)         Wheelchair 50 feet with 2 turns activity    Assist    Wheelchair 50 feet with 2 turns activity did not occur: Safety/medical concerns       Wheelchair 150 feet activity     Assist  Wheelchair 150 feet activity did not occur: Safety/medical concerns       Blood pressure (!) 123/53, pulse 97, temperature 100 F (37.8 C), temperature source Oral, resp. rate 18, height 5\' 4"  (1.626 m), weight 51.7  kg, last menstrual period 01/21/2012, SpO2 97 %.  Medical Problem List and Plan: 1. Functional deficits secondary to cervical myelopathy s/p C5, 6, 7, T1 decompressive laminotomy with P CDF 07/19/2022 per Dr. Dutch Quint.               - Soft cervical collar when out of bed; can be offered bathroom trips  at night.             -patient may shower with surgical site covered             -ELOS/Goals: 7 to 10 days, PT/OT mod I to supervision level  Con't CIR PT and OT  Team conference today to determine length of stay 2.  Antithrombotics: -DVT/anticoagulation:  Mechanical:  Antiembolism stockings, knee (TED hose) Bilateral lower extremities.  Check vascular study 5/14- Dopplers done 5/11- look good-              -antiplatelet therapy: Aspirin 325 mg daily 3. Pain Management: Hydrocodone as needed for pain, Valium as needed for muscle spasms  5/13- pain controlled with no meds- con't regimen 4. Mood/Behavior/Sleep: Trazodone 150 mg nightly.  Provide emotional support  5/13- refused trazodone, will make prn             -antipsychotic agents: N/A 5. Neuropsych/cognition: This patient is? capable of making decisions on her own behalf. 6. Skin/Wound Care: Routine skin checks 7. Fluids/Electrolytes/Nutrition: Routine in and outs with follow-up chemistries 8.  COPD/quit smoking 4 years ago.  Continue albuterol as directed 9.  Hyperlipidemia.  Pravachol 10.  Hypertension with hypotension.  Continue Lisinopril 5 mg daily, Lasix 20 mg daily         --Blood pressure soft 80s over 60s on admission; appears to have been intermittently hypotensive throughout her stay.  Receiving Lasix approximately every other day.  Will DC for now, start daily weights.   5/11 weight reviewed and is 54.3kg  5/13- BP doing better- 100's systolic with TEDs- been running low- held lisinopril today- -reduced lisinopril to 2.5 mg daily 5/14-  will d/w therapy if affecting therapy- pt c/o light headedness when sits up- wearing TEDs- will  stop Lisinopril and explained to pt to drink 6-8 cups/day.  11.  Diabetes mellitus.  Latest hemoglobin A1c 9.3.  Currently SSI, continue  5.13- restarted Amaryl- wasn't taking at home.   514- Bgs looking better- 141 to 188 since started Amaryl- much better 12.  Hypothyroidism. Continue Synthroid 13.  Constipation. Continue Colace 100 mg daily, Dulcolax suppository daily as needed, MiraLAX daily as needed  5.13- sorbitol 45cc- if no results, wrote for Soap suds enema.   5/14- very large BM last night after sorbitol- didn't need enema  14. Low grade fever  5/14- T-100 degrees this AM- felt dizzy, but sounds like not fevrish Sx's- got tylenol- feels better- U/A and CXR yesterday negative- could be atelectasis- so added flutter valve. Dopplers also negative- asked pt to drink 6-8 cups/day of water  I spent a total of 51   minutes on total care today- >50% coordination of care- due to  Team conference today to determine length of stay; as well as medical issues secondary to orthostatic hypotension and low grade temp last night/this AM  LOS: 4 days A FACE TO FACE EVALUATION WAS PERFORMED  Alexandria Webster 07/26/2022, 9:27 AM

## 2022-07-26 NOTE — Progress Notes (Signed)
Physical Therapy Session Note  Patient Details  Name: Alexandria Webster MRN: [id number] Date of Birth: Jun 07, 1965  Today's Date: 07/26/2022 PT Individual Time: 0805-0849 PT Individual Time Calculation (min): 44 min   Short Term Goals: Week 1:  PT Short Term Goal 1 (Week 1): Pt will complete bed mobility with min assist PT Short Term Goal 2 (Week 1): Pt will complete stand pivot transfers with LRAD min assist PT Short Term Goal 3 (Week 1): Pt will initiate gait training PT Short Term Goal 4 (Week 1): Pt will tolerate sitting upright for 5 minutes without orthostasis  Skilled Therapeutic Interventions/Progress Updates: Patient supine in bed on entrance to room. Patient alert and agreeable to PT session.   Patient reported feeling unwell, and endorses no pain. Nurse stated patient was running a low grade fever prior to therapy.   Therapeutic Activity: Bed Mobility: Pt performed mini bridge with minA to approximated B knees on bed in order to donn hospital pants. Patient required minA to fully donn pants around waist, and totalA to donn B TED hose. Pt performed supine<>sit on EOB with HOB slightly elevated and use of HOB rails. Patient required increased effort and time for truncal elevation. Patient's orthostatics checked in supine and in sitting as indicated in vitals, and patient did not report any feelings of lightheadedness/dizziness.   Therapeutic Exercise: Pt performed the following exercises with therapist providing the described cuing and facilitation for improvement. - B LAQ sitting EOB. Patient provided with VC to control concentric/eccentric, and external cues to bring LE into full extension. 2 x 10 - Supine B SLR in bed. Patient provided with controlled motion VC. Patient reported 6/10 pain on L LE, and stated it felt like a muscle pain than sharp. Patient instructed to take breaks as needed as to avoid increasing pain, and to decrease height of elevation. 2 x 10   Patient supine in  bed at end of session with brakes locked, bed alarm set, and all needs within reach.      Therapy Documentation Precautions:  Precautions Precautions: Fall, Cervical Required Braces or Orthoses: Cervical Brace Cervical Brace: Soft collar Other Brace: can be off in bed, can be off for bathroom trips at night Restrictions Weight Bearing Restrictions: No  Vitals: 121/69 (84) - supine in bed 129/67 (85) - sitting EOB  Therapy/Group: Individual Therapy  Gabriela Irigoyen PTA 07/26/2022, 12:37 PM

## 2022-07-26 NOTE — Progress Notes (Signed)
Occupational Therapy Session Note  Patient Details  Name: Alexandria Webster MRN: [id number] Date of Birth: 06/04/1965  Today's Date: 07/26/2022 OT Individual Time: 4098-1191 OT Individual Time Calculation (min): 59 min    Short Term Goals: Week 1:  OT Short Term Goal 1 (Week 1): Pt will complete sit > stand in prep for ADL with Min A using LRAD OT Short Term Goal 2 (Week 1): Pt will complete toilet transfer with min A using LRAD to promote OOB toileting OT Short Term Goal 3 (Week 1): Pt will complete thread BLE into LB clothing using AE PRN with CGA for sitting balance OT Short Term Goal 4 (Week 1): Pt will demonstrate improved recall of cervical precautions for 2 consecutive session with supervision A  Skilled Therapeutic Interventions/Progress Updates:   Pt in bed upon OT arrival for session. 8/10 pain meds given which brought from 10/10 to 8/10 and OT applied ice pack prior to dressing to neck and warm shampoo cap to head with relief reported. TEDS ibn place and resting vitals HR 94, BP 137/60.  EOB then sit to stand to RW with CGA, SPT with RW to recliner with min A. Set up with LE's elevated and pillow supported..Recliner level VSR BP 128/67 HR 92..   Simple oral and hair care recliner level with set up only.   Educated on need for ankle pumps and incentive spirometer while up in chair. Pt able to complete light yellow tband therex for triceps press only 10 reps x 3 sets Bly. No dizziness reported. Left recliner level with chair alarm, needs and nurse call button in reach.   Pain: 8/10 pain meds given which brought from 10/10 to 8/10 and OT applied ice pack prior to dressing to neck and warm shampoo cap to head with relief reported.     Therapy Documentation Precautions:  Precautions Precautions: Fall, Cervical Required Braces or Orthoses: Cervical Brace Cervical Brace: Soft collar Other Brace: can be off in bed, can be off for bathroom trips at night Restrictions Weight  Bearing Restrictions: No   Therapy/Group: Individual Therapy  Vicenta Dunning 07/26/2022, 7:53 AM

## 2022-07-26 NOTE — Progress Notes (Signed)
Patient ID: Alexandria Webster, female   DOB: Apr 14, 1965, 57 y.o.   MRN: [id number]  SW went by pt room to provide updates from team conference,  but pt sleeping. SW will follow-up.   1520- SW spoke with pt dtr Daisey to provide updates from team conference, and d/c date 5/24. Fam edu scheduled for Wed (5/22) 9am-12pm. Reports her mother has a 3in1 BSC, w/c, and RW already. SW informed will follow-up with final recommendations.  Cecile Sheerer, MSW, LCSWA Office: [phone] Cell: [phone] Fax: [phone]

## 2022-07-26 NOTE — Patient Care Conference (Signed)
Inpatient RehabilitationTeam Conference and Plan of Care Update Date: 07/26/2022   Time: 11:27 AM    Patient Name: Alexandria Webster      Medical Record Number: [id number]  Date of Birth: Apr 16, 1965 Sex: Female         Room/Bed: 4M10C/4M10C-01 Payor Info: Payor: Monia Pouch / Plan: [employer] [facility] [employer]  / Product Type: *No Product type* /    Admit Date/Time:  07/22/2022  3:21 PM  Primary Diagnosis:  Cervical myelopathy [facility])  Hospital Problems: Principal Problem:   Cervical myelopathy [facility])    Expected Discharge Date: Expected Discharge Date: 08/05/22  Team Members Present: Physician leading conference: Dr. Genice Rouge Social Worker Present: Cecile Sheerer, LCSWA Nurse Present: Vedia Pereyra, RN PT Present: Bernie Covey, PT OT Present: Roney Mans, OT     Current Status/Progress Goal Weekly Team Focus  Bowel/Bladder   Pt is cont. of B/B LBM: 5/13   Pt remain continent of B/B   toileting qshift and PRN    Swallow/Nutrition/ Hydration               ADL's   Mod A LB bathing, Max A LB dressing, SBA UB bathing, Min A UB dressing. Min A HHA stand pivot transfer. Increased fatigue and decreased activity tolerance during session   SBA mobility, Min A BADL   transfers, endurance/activity tolerance, pt education, balance, ADL re-training    Mobility   CGA all mobility, limited by pain and vertigo vs orthostasis   supervision transfers, min A gait and stairs  transfers, endurance,  education    Communication                Safety/Cognition/ Behavioral Observations               Pain   Pt denies pain   remain pain free   Pain assessment q shift and PRN    Skin   Pt skin is intact   Skin integrity remain intact  Skin assessment qshift and PRN      Discharge Planning:  Pt will d/c to home with her daughter who will provide 24/7 care as she is not working. SW will confirm there are no barriers to discharge.   Team Discussion: Cervical myelopathy.  Soft collar. Soft B/P. ?orthostatic B/P- dizziness doesn't always corollate to blood pressure drop. Felicity Pellegrini hose on. Lovenox started. Daily weights. Low grade temperature this am. Chest xray negative. U/A sent, C&S pending. Incision healing. Therapies limited by increased fatigue and decreased activity tolerance.  Patient on target to meet rehab goals: yes, able to upgrade some goals.   *See Care Plan and progress notes for long and short-term goals.   Revisions to Treatment Plan:  Medication adjustments, Vestibular assessment. Monitor labs/VS Teaching Needs: Medications, safety, self care, gait/transfers training, skin care, etc.   Current Barriers to Discharge: Decreased caregiver support and Wound care  Possible Resolutions to Barriers: Family education, nursing education, monitor incision for S/S of infection, order recommended DME     Medical Summary Current Status: LBM last evening; occ pain; attached edges of back incision; BP soft- vodiing but drinking small amount  Barriers to Discharge: Hypotension;Behavior/Mood;Medical stability;Self-care education;Weight bearing restrictions;Uncontrolled Diabetes  Barriers to Discharge Comments: BP sitting low- 70-s80s- up to 100s/60s with TEDs- dizziness didn't correlate with low BP timing? pain limiting; Possible Resolutions to [employer]: - limited by intermittent cognition- temp 100 degres this AM- cannot find out why?- U/A and CXR (-)- and Dopplers- started lovenox- started flutter valve-  d/c 5/24   Continued Need for Acute Rehabilitation Level of Care: The patient requires daily medical management by a physician with specialized training in physical medicine and rehabilitation for the following reasons: Direction of a multidisciplinary physical rehabilitation program to maximize functional independence : Yes Medical management of patient stability for increased activity during participation in an intensive rehabilitation regime.:  Yes Analysis of laboratory values and/or radiology reports with any subsequent need for medication adjustment and/or medical intervention. : Yes   I attest that I was present, lead the team conference, and concur with the assessment and plan of the team.   Jearld Adjutant 07/26/2022, 3:04 PM

## 2022-07-26 NOTE — Progress Notes (Signed)
Physical Therapy Session Note  Patient Details  Name: Alexandria Webster MRN: [id number] Date of Birth: 1965-08-11  Today's Date: 07/26/2022 PT Individual Time: 1130-1200, 1420-1528 PT Individual Time Calculation (min): 30 min, 68 min   Short Term Goals: Week 1:  PT Short Term Goal 1 (Week 1): Pt will complete bed mobility with min assist PT Short Term Goal 2 (Week 1): Pt will complete stand pivot transfers with LRAD min assist PT Short Term Goal 3 (Week 1): Pt will initiate gait training PT Short Term Goal 4 (Week 1): Pt will tolerate sitting upright for 5 minutes without orthostasis  Skilled Therapeutic Interventions/Progress Updates:    Session !: Pt received in recliner and agreeable to therapy.  Pt reports unrated pain in her neck, but required no intervention this session. Pt performed Sit to stand with CGA throughout session to RW. Vitals and dizzy symptoms noted below. Note that pt has less orthostatic symptoms when actively moving vs static standing. Pt then ambulated with CGA x~100 ft with RW, no dizziness. Pt was limited by LE fatigue. Pt returned to room and to bed with supervision, was left with all needs in reach and alarm active.   Vitals: Seated 118/64 (83) HR=93 Seated after standing ~2 min 94/59 (72) HR=100 *symptomatic Seated after marching x 2 min 112/96 (104) HR=100 Asymptomatic  Session 2: pt received in bed and agreeable to therapy. Pt reports up to 9/10 neck pain, addressed with positioning and rest breaks. Educated on taking PRN medications before therapy, pt reports she didn't know that she has to ask for PRN meds. Pt performed bed mobility with supervision, cueing for log roll. Pt's biggest deficits continues to be safety awareness and endurance. Pt ambulated ~100 ft x2 during session. Gait limited by LE endurance. Demoes knee hyperextension in stance, which pt reports she can feel but not yet correct. Mildly ataxic gait at times, but no overt LOB. CGA with RW. W/c  follow in case of fatigue, which tends to be limiting factor. Session focused on stair training for endurance and access to her daughter's home at d/c. Pt performed step taps and single step ups before progressing to full stair navigation 5 x 4 steps with BIL hand rails, CGA, and step to pattern. Seated rest breaks spent discussing energy conservation and pt's deficits. Transitioned to seated exercise on nustep x 4 min at level 5, but discontinued d/t pain with neck unsupported. Transitioned to Sit to stand with no UE support for balance and strength challenge, 3 x 5. Supine rest breaks for pain management. Pt returned to bed at end of session and was left with all needs in reach and alarm active.    Therapy Documentation Precautions:  Precautions Precautions: Fall, Cervical Required Braces or Orthoses: Cervical Brace Cervical Brace: Soft collar Other Brace: can be off in bed, can be off for bathroom trips at night Restrictions Weight Bearing Restrictions: No General:       Therapy/Group: Individual Therapy  Juluis Rainier 07/26/2022, 12:37 PM

## 2022-07-27 DIAGNOSIS — G959 Disease of spinal cord, unspecified: Secondary | ICD-10-CM | POA: Diagnosis not present

## 2022-07-27 LAB — GLUCOSE, CAPILLARY
Glucose-Capillary: 139 mg/dL — ABNORMAL HIGH (ref 70–99)
Glucose-Capillary: 138 mg/dL — ABNORMAL HIGH (ref 70–99)
Glucose-Capillary: 89 mg/dL (ref 70–99)
Glucose-Capillary: 157 mg/dL — ABNORMAL HIGH (ref 70–99)

## 2022-07-27 MED ORDER — MIDODRINE HCL 5 MG PO TABS
5.0000 mg | ORAL_TABLET | Freq: Two times a day (BID) | ORAL | Status: DC
  Administered 2022-07-27 – 2022-08-05 (×18): 5 mg via ORAL
  Filled 2022-07-27 (×18): qty 1

## 2022-07-27 NOTE — Progress Notes (Signed)
Physical Therapy Session Note  Patient Details  Name: Alexandria Webster MRN: [id number] Date of Birth: [birth year]  Today's Date: 07/27/2022 PT Individual Time: 0731-0813 PT Individual Time Calculation (min): 42 min   Short Term Goals: Week 1:  PT Short Term Goal 1 (Week 1): Pt will complete bed mobility with min assist PT Short Term Goal 2 (Week 1): Pt will complete stand pivot transfers with LRAD min assist PT Short Term Goal 3 (Week 1): Pt will initiate gait training PT Short Term Goal 4 (Week 1): Pt will tolerate sitting upright for 5 minutes without orthostasis  Skilled Therapeutic Interventions/Progress Updates:   Received pt semi-reclined in bed with soft collar on. Pt agreeable to PT treatment and reported pain 7/10 in neck - RN notified and present to administer pain medication. MD present and encouraged increasing fluid intake to 6-8 cups of water per day. Session with emphasis on functional mobility/transfers, generalized strengthening and endurance, and gait training. Pt transferred semi-reclined<>sitting EOB with HOB elevated and use of bedrails with supervision using logroll technique. Pt unable to tolerate sitting EOB for extended amount of time with neck unsupported; therefore transferred into recliner via stand<>pivot with RW and CGA and elevated feet while RN administered medication and took manual BP. Stood with RW and CGA and ambulated 158ft with RW and CGA with close WC follow for safety - noted mild ataxic gait but no LOB. Returned to recliner and concluded session with pt sitting in recliner, needs within reach, and seatbelt alarm on.  Vitals with teds donned: BP Supine: 146/75 - pt asymptomatic  BP Seated EOB: 95/70 - pt reported mild dizziness BP Seated in recliner after transferring: 75/55 - pt symptomatic; manual BP taken: 122/60  Manual BP Standing: 70/50 - pt symptomatic Manual BP seated after standing: 120/62 - pt mildly symptomatic BP Seated after ambulating:  130/79 - symptoms resolved with rest  Therapy Documentation Precautions:  Precautions Precautions: Fall, Cervical Required Braces or Orthoses: Cervical Brace Cervical Brace: Soft collar Other Brace: can be off in bed, can be off for bathroom trips at night Restrictions Weight Bearing Restrictions: No  Therapy/Group: Individual Therapy Marlana Salvage Zaunegger Blima Rich PT, DPT 07/27/2022, 6:54 AM

## 2022-07-27 NOTE — Progress Notes (Signed)
Recreational Therapy Session Note  Patient Details  Name: Alexandria Webster MRN: [id number] Date of Birth: 1965-09-11 Today's Date: 07/27/2022   Pain:no c/o  Pt was excited to be offered animal assisted activities.  Pt initiated bed mobility, sitting EOB with supervision.  Pt sat EOB ~5 minutes with supervision interacting with the pet partner team.  Pt so appreciative of this visit. response:  Giomar Gusler 07/27/2022, 3:20 PM

## 2022-07-27 NOTE — Progress Notes (Signed)
PROGRESS NOTE   Subjective/Complaints:  Pt reports drinking ~ 4 cups/water day- educated again to drink 6-8 cups/day.   Pt reports still dizzy/lightheaded when stands up with therapy.  Otherwise, doesn't feel ill.    LBM 2 days ago- voiding well.  Pain 7/10 this AM before pain meds.    ROS:   Pt denies SOB, abd pain, CP, N/V/C/D, and vision changes  Except for HPI   Objective:   DG CHEST PORT 1 VIEW  Result Date: 07/25/2022 CLINICAL DATA:  Delirium. EXAM: PORTABLE CHEST 1 VIEW COMPARISON:  X-ray 10/12/2018 and older FINDINGS: No consolidation, pneumothorax or effusion. Slight linear opacity of the left lung base likely scar or atelectasis. Normal cardiopericardial silhouette. Calcified aorta. Degenerative changes of the spine. Osteopenia. Fixation hardware seen along the cervical spine at the edge of the imaging field. IMPRESSION: Minimal left basilar scar or atelectasis. No consolidation or effusion. Electronically Signed   By: Karen Kays M.D.   On: 07/25/2022 15:40   Recent Labs    07/25/22 1040  WBC 6.9  HGB 9.4*  HCT 27.5*  PLT 255   Recent Labs    07/25/22 0611  NA 136  K 3.9  CL 101  CO2 24  GLUCOSE 143*  BUN 17  CREATININE 1.03*  CALCIUM 8.5*    Intake/Output Summary (Last 24 hours) at 07/27/2022 1054 Last data filed at 07/27/2022 0737 Gross per 24 hour  Intake 592 ml  Output --  Net 592 ml        Physical Exam: Vital Signs Blood pressure 139/65, pulse 96, temperature 99.7 F (37.6 C), temperature source Oral, resp. rate 18, height 5\' 4"  (1.626 m), weight 51.5 kg, last menstrual period 01/21/2012, SpO2 99 %.     General: awake, alert, appropriate, sitting up slightly in bed; NAD HENT: conjugate gaze; oropharynx moist-wearing cervical collar CV: regular to almost tachycardic rate; no JVD Pulmonary: CTA B/L; no W/R/R- good air movement GI: soft, NT, ND, (+)BS- hypoactive Psychiatric:  appropriate- flat Neurological: Ox3 with cues this AM- better Skin: C/D/I. No apparent lesions.  Posterior neck surgical site covered in clean honeycomb dressing. Some tenting on hand MSK:      No apparent deformity.      Strength:                RUE: 4+ to 5-/5 throughout                LUE: 4/5 SA, 4-/5 EF, 4-/5 EE, 3+/5 WE, 3/5 FF, 3/5 FA                 RLE: 4 out of 5 throughout                LLE: 4 of 5 throughout   Neurologic exam:  Cognition: AAO to person, place, time and event.  Language: Fluent, No substitutions or neoglisms. No dysarthria. Names 3/3 objects correctly.  Memory: Recalls 3/3 objects at 5 minutes. No apparent deficits  Insight: Good insight into current condition.  Mood: Pleasant affect, appropriate mood.  Sensation: To light touch intact in BL UEs and LEs  Reflexes: Hyporeflexic in bilateral upper extremities and right  lower extremity; hyperreflexic at left patella. Negative Hoffman's and babinski signs bilaterally.  CN: 2-12 grossly intact.  Coordination: Fine motor coordination impaired and ataxia in left upper extremity Spasticity: MAS 0 in all extremities.    Assessment/Plan: 1. Functional deficits which require 3+ hours per day of interdisciplinary therapy in a comprehensive inpatient rehab setting. Physiatrist is providing close team supervision and 24 hour management of active medical problems listed below. Physiatrist and rehab team continue to assess barriers to discharge/monitor patient progress toward functional and medical goals  Care Tool:  Bathing    Body parts bathed by patient: Right arm, Left arm, Chest, Abdomen, Face   Body parts bathed by helper: Front perineal area, Buttocks, Right upper leg, Left upper leg, Right lower leg, Left lower leg     Bathing assist Assist Level: Maximal Assistance - Patient 24 - 49%     Upper Body Dressing/Undressing Upper body dressing   What is the patient wearing?: Pull over shirt, Orthosis  (cervical collar) Orthosis activity level: Performed by helper  Upper body assist Assist Level: Maximal Assistance - Patient 25 - 49%    Lower Body Dressing/Undressing Lower body dressing      What is the patient wearing?: Pants     Lower body assist Assist for lower body dressing: Maximal Assistance - Patient 25 - 49%     Toileting Toileting Toileting Activity did not occur (Clothing management and hygiene only): Refused  Toileting assist Assist for toileting: Total Assistance - Patient < 25%     Transfers Chair/bed transfer  Transfers assist  Chair/bed transfer activity did not occur: Safety/medical concerns (orthostatic hypotension with sit<>stand)  Chair/bed transfer assist level: Contact Guard/Touching assist     Locomotion Ambulation   Ambulation assist   Ambulation activity did not occur: Safety/medical concerns (orthostatic hypotension with sit<>stand)  Assist level: Contact Guard/Touching assist Assistive device: Walker-rolling Max distance: 182ft   Walk 10 feet activity   Assist  Walk 10 feet activity did not occur: Safety/medical concerns  Assist level: Contact Guard/Touching assist Assistive device: Walker-rolling   Walk 50 feet activity   Assist Walk 50 feet with 2 turns activity did not occur: Safety/medical concerns  Assist level: Contact Guard/Touching assist Assistive device: Walker-rolling    Walk 150 feet activity   Assist Walk 150 feet activity did not occur: Safety/medical concerns         Walk 10 feet on uneven surface  activity   Assist Walk 10 feet on uneven surfaces activity did not occur: Safety/medical concerns         Wheelchair     Assist Is the patient using a wheelchair?: Yes Type of Wheelchair: Manual Wheelchair activity did not occur: Safety/medical concerns (orthostatic hypotension with sit<>stand)         Wheelchair 50 feet with 2 turns activity    Assist    Wheelchair 50 feet with 2  turns activity did not occur: Safety/medical concerns       Wheelchair 150 feet activity     Assist  Wheelchair 150 feet activity did not occur: Safety/medical concerns       Blood pressure 139/65, pulse 96, temperature 99.7 F (37.6 C), temperature source Oral, resp. rate 18, height 5\' 4"  (1.626 m), weight 51.5 kg, last menstrual period 01/21/2012, SpO2 99 %.  Medical Problem List and Plan: 1. Functional deficits secondary to cervical myelopathy s/p C5, 6, 7, T1 decompressive laminotomy with P CDF 07/19/2022 per Dr. Dutch Quint.               -  Soft cervical collar when out of bed; can be offered bathroom trips at night.             -patient may shower with surgical site covered             -ELOS/Goals: 7 to 10 days, PT/OT mod I to supervision level  D/c 5/24  Con't CIR PT and OT_ with TEDs and will add midodrine due to BP limiting therapy 2.  Antithrombotics: -DVT/anticoagulation:  Mechanical:  Antiembolism stockings, knee (TED hose) Bilateral lower extremities.  Check vascular study 5/14- Dopplers done 5/11- look good-              -antiplatelet therapy: Aspirin 325 mg daily 3. Pain Management: Hydrocodone as needed for pain, Valium as needed for muscle spasms  5/13- pain controlled with no meds- con't regimen 5/15- taking pain meds 2-3x/day at most and no valium lately- valium likely caused her confusion prior.  4. Mood/Behavior/Sleep: Trazodone 150 mg nightly.  Provide emotional support  5/13- refused trazodone, will make prn             -antipsychotic agents: N/A 5. Neuropsych/cognition: This patient is? capable of making decisions on her own behalf. 6. Skin/Wound Care: Routine skin checks 7. Fluids/Electrolytes/Nutrition: Routine in and outs with follow-up chemistries 8.  COPD/quit smoking 4 years ago.  Continue albuterol as directed 9.  Hyperlipidemia.  Pravachol 10.  Hx of Hypertension with orthostatic hypotension.  Continue Lisinopril 5 mg daily, Lasix 20 mg daily          --Blood pressure soft 80s over 60s on admission; appears to have been intermittently hypotensive throughout her stay.  Receiving Lasix approximately every other day.  Will DC for now, start daily weights. 5/14-  will d/w therapy if affecting therapy- pt c/o light headedness when sits up- wearing TEDs- will stop Lisinopril and explained to pt to drink 6-8 cups/day.  5/15- will add Midodrine 8am and noon daily 5 mg- since dropped into 80s systolic with standing again this AM- of note, weight down to 51.5 kg.  11.  Diabetes mellitus.  Latest hemoglobin A1c 9.3.  Currently SSI, continue  5.13- restarted Amaryl- wasn't taking at home.   514- Bgs looking better- 141 to 188 since started Amaryl- much better  5/15- CBGs 100-175- doing much better- con't regimen 12.  Hypothyroidism. Continue Synthroid 13.  Constipation. Continue Colace 100 mg daily, Dulcolax suppository daily as needed, MiraLAX daily as needed  5.13- sorbitol 45cc- if no results, wrote for Soap suds enema.   5/14- very large BM last night after sorbitol- didn't need enema    14. Low grade fever  5/14- T-100 degrees this AM- felt dizzy, but sounds like not fevrish Sx's- got tylenol- feels better- U/A and CXR yesterday negative- could be atelectasis- so added flutter valve. Dopplers also negative- asked pt to drink 6-8 cups/day of water  5/15- T 99.7 this AM, but not feeling badly. Will check labs again in AM  I spent a total of  42  minutes on total care today- >50% coordination of care- due to  Educated pt about drinking 6-8 cups,/day, spoke with PT x2- about pt's orthostatic hypotension- and d/w NT in room with pt  LOS: 5 days A FACE TO FACE EVALUATION WAS PERFORMED  Ananda Sitzer 07/27/2022, 10:54 AM

## 2022-07-27 NOTE — Progress Notes (Signed)
Physical Therapy Session Note  Patient Details  Name: Alexandria Webster MRN: [id number] Date of Birth: [birth year]  Today's Date: 07/27/2022 PT Individual Time: 1405-1505 PT Individual Time Calculation (min): 60 min   Short Term Goals: Week 1:  PT Short Term Goal 1 (Week 1): Pt will complete bed mobility with min assist PT Short Term Goal 2 (Week 1): Pt will complete stand pivot transfers with LRAD min assist PT Short Term Goal 3 (Week 1): Pt will initiate gait training PT Short Term Goal 4 (Week 1): Pt will tolerate sitting upright for 5 minutes without orthostasis  Skilled Therapeutic Interventions/Progress Updates: Pt presented in bed agreeable to therapy. Pt denies pain at rest and no pain behaviors noted during session. BP assessed during session as noted below. Pt with x 1 episode of dizziness during session however BP stable and resolved with seated rest. Pt performed bed mobility with CGA, use of bed features and increased time. PTA donned shoes total A for time management. Pt requesting to use bathroom prior to leaving room. Performed ambulatory transfer to bathroom with RW and CGA. Pt was CGA for clothing management with continent urinary void. Once completed pt stood with required light minA to pull pants over hips. Pt then ambulated to sink with CGA for hand hygiene. Pt ambulated to main gym ~237ft with CGA and w/c follow. Pt with no LOB during ambulation but did require increased seated rest for recovery. Pt then participated in standing balance activities including toe taps to target on 4in step, then reaching with single LE on 4in block with pt requiring minA when working on maintaining balance with RLE on floor. Pt also performed reaching task on level surface placing horseshoes on basketball hoop as attempted to use clothespins but increased difficulty due to visual deficits. Pt then ambulated ~170ft back towards room in same manner as prior and transported remaining distance back to  bed. Performed step pivot transfer to bed with RW and CGA and was able to transfer to supine with supervision. Pt left in bed at end of session with bed alarm on, call bell within reach and needs met.   BP supine 148/88 (93) HR 87 BP sitting 124/85 (95) HR 101 BP after standing activitiy 127/72 (86) HR 96     Therapy Documentation Precautions:  Precautions Precautions: Fall, Cervical Required Braces or Orthoses: Cervical Brace Cervical Brace: Soft collar Other Brace: can be off in bed, can be off for bathroom trips at night Restrictions Weight Bearing Restrictions: No General:   Vital Signs: Therapy Vitals Temp: 97.7 F (36.5 C) Temp Source: Oral Pulse Rate: 94 Resp: 16 BP: (!) 129/98 Patient Position (if appropriate): Lying Oxygen Therapy SpO2: 100 % O2 Device: Room Air Pain:   Mobility:   Locomotion :    Trunk/Postural Assessment :    Balance:   Exercises:   Other Treatments:      Therapy/Group: Individual Therapy  Keir Viernes 07/27/2022, 4:41 PM

## 2022-07-27 NOTE — Evaluation (Signed)
Recreational Therapy Assessment and Plan  Patient Details  Name: Alexandria Webster MRN: [id number] Date of Birth: Oct 26, 1965 Today's Date: 07/27/2022  Rehab Potential:  Good ELOS:   d/c 5/24  Assessment  Hospital Problem: Principal Problem:   Cervical myelopathy (HCC)     Past Medical History:      Past Medical History:  Diagnosis Date   Asthma     COPD (chronic obstructive pulmonary disease) (HCC)     DDD (degenerative disc disease), lumbar     Depression     Diabetes mellitus     Gastroparesis     GERD without esophagitis     High cholesterol     Hypertension     Hypothyroidism     PONV (postoperative nausea and vomiting)     PVD (peripheral vascular disease) (HCC)     Wears glasses      Past Surgical History:       Past Surgical History:  Procedure Laterality Date   AORTA - BILATERAL FEMORAL ARTERY BYPASS GRAFT N/A 09/27/2018    Procedure: Redo Exposure  AORTOBIFEMORAL Bypass and bilateral femoral Artery ; Redo Aortobifemoral  BYPASS GRAFT.;  Surgeon: Cephus Shelling, MD;  [facility]: [facility] OR;  Service: Vascular;  Laterality: N/A;   BACK SURGERY       BIOPSY   10/14/2021    Procedure: BIOPSY;  Surgeon: Lanelle Bal, DO;  Location: [facility] SUITE;  Service: Endoscopy;;   COLONOSCOPY WITH PROPOFOL N/A 10/14/2021    Procedure: COLONOSCOPY WITH PROPOFOL;  Surgeon: Lanelle Bal, DO;  Location: [facility];  Service: Endoscopy;  Laterality: N/A;  8:30am   ESOPHAGOGASTRODUODENOSCOPY (EGD) WITH PROPOFOL N/A 10/14/2021    Procedure: ESOPHAGOGASTRODUODENOSCOPY (EGD) WITH PROPOFOL;  Surgeon: Lanelle Bal, DO;  Location: [facility];  Service: Endoscopy;  Laterality: N/A;   FEMORAL ARTERY STENT   right leg   POSTERIOR CERVICAL FUSION/FORAMINOTOMY N/A 07/19/2022    Procedure: Posterior cervical fusion with lateral mass fixation - Cervical four-cervical five, Cervical five-Cervical six - Cervical six-Cervical seven - Cervical seven-Thoracic one with laminectomy;   Surgeon: Julio Sicks, MD;  Location: [facility] OR;  Service: Neurosurgery;  Laterality: N/A;   TUBAL LIGATION          Assessment & Plan Clinical Impression: Patient is a 57 year old right-handed female with history of diabetes mellitus, hypertension, hyperlipidemia, COPD who quit smoking 4 years ago. Per chart review patient lives with husband and daughter. 1 level home 5 steps to entry. Daughter was assisting with ADLs. Presented 07/19/2022 with bilateral upper extremity numbness and paresthesias as well as weakness with increasing gait instability and frequent falls over the past year. Initially patient stated her right side was weaker however more recently her left side has become more affected. CT/MRI of the brain showed no acute intracranial abnormality there was a small remote lacunar infarct about the thalami and cerebellum. MRI of the cervical spine showed multilevel cervical spondylosis with resultant diffuse spinal stenosis, severe in nature at C5-6 and C6-7. Patchy cord signal changes at these levels consistent with myelomalacia. Question additional subtle cord signal abnormality at the level of C3-4. Patient underwent C5-6-C7, T1 decompressive laminotomy with C4-5-6-C7 and T1 posterior lateral arthrodesis 07/19/2022 per Dr. Jordan Likes. Placed in a cervical soft collar that can be off when in bed. Blood pressures have been soft and monitored when up with therapies. Therapy evaluations completed due to patient's decreased functional mobility was admitted for a comprehensive rehab program.    Pt presents with  decreased activity tolerance, decreased functional mobility, decreased balance, decreased coordination Limiting pt's independence with leisure/community pursuits.  Met with pt today to discuss TR services including leisure education, activity analysis/modifications and stress management.  Also discussed the importance of social, emotional, spiritual health in addition to physical health and their effects on  overall health and wellness.  Pt stated understanding.    Plan   Min 1 TR session >20 minutes during LOS Recommendations for other services: None   Discharge Criteria: Patient will be discharged from TR if patient refuses treatment 3 consecutive times without medical reason.  If treatment goals not met, if there is a change in medical status, if patient makes no progress towards goals or if patient is discharged from hospital.  The above assessment, treatment plan, treatment alternatives and goals were discussed and mutually agreed upon: by patient  Bo Rogue 07/27/2022, 3:28 PM

## 2022-07-27 NOTE — Progress Notes (Signed)
Physical Therapy Session Note  Patient Details  Name: Alexandria Webster MRN: [id number] Date of Birth: 1965-07-08  Today's Date: 07/27/2022 PT Individual Time: 1119-1202 PT Individual Time Calculation (min): 43 min   Short Term Goals: Week 1:  PT Short Term Goal 1 (Week 1): Pt will complete bed mobility with min assist PT Short Term Goal 2 (Week 1): Pt will complete stand pivot transfers with LRAD min assist PT Short Term Goal 3 (Week 1): Pt will initiate gait training PT Short Term Goal 4 (Week 1): Pt will tolerate sitting upright for 5 minutes without orthostasis  Skilled Therapeutic Interventions/Progress Updates:    Pt presents in room in bed, denies pain, agreeable to PT. Session focused on gait training, thereapeutic exercises, and transfer training while monitoring vitals to address functional goals. Pt completes bed mobility with HOB elevated with CGA. Pt completes stand pivot transfer without device min assist HHA, sit<>stands with RW CGA. Pt vitals assessed throughout session.  Pt transitions to sitting EOB with arrival of therapy dog to improve pt engagement and participation with therapy at beginning of session. Pt sits EOB to acclimate to upright while petting therapy dog for , denies symptoms. Pt completes transported to main gym in [facility] dependently for time management. Pt completes seated therex before and after ambulation as described below.  Seated therex for BLE muscle fiber recruitment and BLE coordination as well as to elevate BP: Marching 3x20 alterating BLE (verbal cues for BLE coordination and controlling speed) LAQs 3x20 alternating BLE Heel/toe raise x20 (verbal cues for coordination and sequencing.  Pt completes gait training 2x132' with RW CGA, verbal cues for "landing softly" on L foot to decrease ataxia noted with ambulation, demonstrates improved LLE control with heel strike with cueing. Pt requires extended seated rest breaks following gait training for fatigue  and to reduce symptoms of dizziness which pt reports are mild following ambulation.  Vitals: BP 148/83 mmHg, HR 98bpm in supine prior to activity BP 114/74 mmHg, HR 104 bpm in sitting following stand pivot transrfer BP 116/59 mmHg, HR 100 bpm after walk and 1 round seated therex BP 113/56 mmHg, HR 109 bpm after 2nd walk  Pt returned to room dependently in [facility], requests to return to bed at end of session. Pt transfers to bed and remains supine with HOB elevated, bed alarm activated, all needs within reahc, and call light in place at end of session.  Therapy Documentation Precautions:  Precautions Precautions: Fall, Cervical Required Braces or Orthoses: Cervical Brace Cervical Brace: Soft collar Other Brace: can be off in bed, can be off for bathroom trips at night Restrictions Weight Bearing Restrictions: No   Therapy/Group: Individual Therapy  Edwin Cap PT, DPT 07/27/2022, 12:08 PM

## 2022-07-27 NOTE — Progress Notes (Signed)
Occupational Therapy Session Note  Patient Details  Name: Alexandria Webster MRN: [id number] Date of Birth: [birth year]  Today's Date: 07/27/2022 OT Individual Time: 1000-1100 OT Individual Time Calculation (min): 60 min    Short Term Goals: Week 1:  OT Short Term Goal 1 (Week 1): Pt will complete sit > stand in prep for ADL with Min A using LRAD OT Short Term Goal 2 (Week 1): Pt will complete toilet transfer with min A using LRAD to promote OOB toileting OT Short Term Goal 3 (Week 1): Pt will complete thread BLE into LB clothing using AE PRN with CGA for sitting balance OT Short Term Goal 4 (Week 1): Pt will demonstrate improved recall of cervical precautions for 2 consecutive session with supervision A  Skilled Therapeutic Interventions/Progress Updates:    ADL retraining including toileting bathroom level on 3 in 1 over toilet with standing 2 min tolerance for OT to don incont brief, cset up for peri hygiene and min A pants mngt,. Amb to sink side with RW with min A to w/c set at sink. standing oral and hair care with CGA.Marland Kitchen Returned to bed level with RW amb with min A due to drop in BP.   Educated hydration, HOB elevated, and ankle pumps use of yellow tband between visits alarm, needs and nurse call button in reach.   Vitals with knee high TEDS:  Bed level: BP 126/62 HR 92 W/c level after toileting with short bouts of standing: BP 113/60 HR 100 Standing sink side: BP 88/57 HR 106 mildly symptomatic  Resting back bed level with HOB elevated: 138/71 HR 100   Pain: denied pain at rest then 6/10 neck with rest, ice applied down to 5/10 and educated to remove after 15-20 min with + verbalization of understanding    Therapy Documentation Precautions:  Precautions Precautions: Fall, Cervical Required Braces or Orthoses: Cervical Brace Cervical Brace: Soft collar Other Brace: can be off in bed, can be off for bathroom trips at night Restrictions Weight Bearing Restrictions:  No   Therapy/Group: Individual Therapy  Vicenta Dunning 07/27/2022, 7:46 AM

## 2022-07-28 LAB — CBC WITH DIFFERENTIAL/PLATELET
Abs Immature Granulocytes: 0.02 10*3/uL (ref 0.00–0.07)
Basophils Absolute: 0 10*3/uL (ref 0.0–0.1)
Basophils Relative: 1 %
Eosinophils Absolute: 0.5 10*3/uL (ref 0.0–0.5)
Eosinophils Relative: 9 %
HCT: 30.4 % — ABNORMAL LOW (ref 36.0–46.0)
Hemoglobin: 10.1 g/dL — ABNORMAL LOW (ref 12.0–15.0)
Immature Granulocytes: 0 %
Lymphocytes Relative: 23 %
Lymphs Abs: 1.4 10*3/uL (ref 0.7–4.0)
MCH: 32.6 pg (ref 26.0–34.0)
MCHC: 33.2 g/dL (ref 30.0–36.0)
MCV: 98.1 fL (ref 80.0–100.0)
Monocytes Absolute: 0.6 10*3/uL (ref 0.1–1.0)
Monocytes Relative: 10 %
Neutro Abs: 3.4 10*3/uL (ref 1.7–7.7)
Neutrophils Relative %: 57 %
Platelets: 390 10*3/uL (ref 150–400)
RBC: 3.1 MIL/uL — ABNORMAL LOW (ref 3.87–5.11)
RDW: 12 % (ref 11.5–15.5)
WBC: 6 10*3/uL (ref 4.0–10.5)
nRBC: 0 % (ref 0.0–0.2)

## 2022-07-28 LAB — GLUCOSE, CAPILLARY
Glucose-Capillary: 152 mg/dL — ABNORMAL HIGH (ref 70–99)
Glucose-Capillary: 69 mg/dL — ABNORMAL LOW (ref 70–99)
Glucose-Capillary: 103 mg/dL — ABNORMAL HIGH (ref 70–99)
Glucose-Capillary: 246 mg/dL — ABNORMAL HIGH (ref 70–99)
Glucose-Capillary: 60 mg/dL — ABNORMAL LOW (ref 70–99)
Glucose-Capillary: 62 mg/dL — ABNORMAL LOW (ref 70–99)
Glucose-Capillary: 97 mg/dL (ref 70–99)

## 2022-07-28 LAB — BASIC METABOLIC PANEL
Anion gap: 7 (ref 5–15)
BUN: 17 mg/dL (ref 6–20)
CO2: 26 mmol/L (ref 22–32)
Calcium: 9 mg/dL (ref 8.9–10.3)
Chloride: 104 mmol/L (ref 98–111)
Creatinine, Ser: 1.02 mg/dL — ABNORMAL HIGH (ref 0.44–1.00)
GFR, Estimated: >60 mL/min (ref >60)
Glucose, Bld: 166 mg/dL — ABNORMAL HIGH (ref 70–99)
Potassium: 4.4 mmol/L (ref 3.5–5.1)
Sodium: 137 mmol/L (ref 135–145)

## 2022-07-28 MED ORDER — MELATONIN 5 MG PO TABS
5.0000 mg | ORAL_TABLET | Freq: Every day | ORAL | Status: DC
  Administered 2022-07-28 – 2022-08-04 (×8): 5 mg via ORAL
  Filled 2022-07-28 (×8): qty 1

## 2022-07-28 MED ORDER — SORBITOL 70 % SOLN
30.0000 mL | Freq: Once | Status: AC
  Administered 2022-07-28: 30 mL via ORAL
  Filled 2022-07-28: qty 30

## 2022-07-28 MED ORDER — POLYETHYLENE GLYCOL 3350 17 G PO PACK
17.0000 g | PACK | Freq: Every day | ORAL | Status: DC
  Administered 2022-07-28 – 2022-08-05 (×9): 17 g via ORAL
  Filled 2022-07-28 (×9): qty 1

## 2022-07-28 NOTE — Progress Notes (Signed)
Physical Therapy Session Note  Patient Details  Name: Alexandria Webster MRN: [id number] Date of Birth: February 14, 1966  Today's Date: 07/28/2022 PT Individual Time: 4098-1191 PT Individual Time Calculation (min): 47 min   Short Term Goals: Week 1:  PT Short Term Goal 1 (Week 1): Pt will complete bed mobility with min assist PT Short Term Goal 2 (Week 1): Pt will complete stand pivot transfers with LRAD min assist PT Short Term Goal 3 (Week 1): Pt will initiate gait training PT Short Term Goal 4 (Week 1): Pt will tolerate sitting upright for 5 minutes without orthostasis  Skilled Therapeutic Interventions/Progress Updates:    Pt presents in room in bed, agreeable to PT. Pt reports slight pain but declines intervention at this time. Vitals assessed throughout session. Session focused on gait training, therex to elevate BP and for BLE coordination, and changing positions based on pt symptoms. Pt completes supine<>sit supervision, CGA stand pivot with RW.  Pt transfers to sitting in WC, transported dependently for time management to main gym. Pt completes ambulation 170' CGA with RW, verbal cues to "land softly" to decrease ataxia with pt demonstrating improved gait mechanics with cueing.  Once seated pt takes rest break, no symptoms at first however symptoms progress with prolonged sitting. Pt completes seated therex to elevated BP interval training 30sec work/30sec rest 1 round including: Marching alternating BLE LAQs alternating BLE Heel/toe raise  Pt reporting progressive symptoms with BLE movements including feeling like she would "pass out." Pt transported back to room dependently for time management and returned to supine where vitals were assessed to be WNL however pt reporting persistent symptoms with immediate supine that resolve with increased time in supine. Pt completes supine therex to promote elevated BP and BLE strengthening and coordination including: Bridging x10 BLE marching  x10 Bent knee fall out x10 BLE SLR x10 BLE *verbal and tactile cues provided for BLE coordination, eccentric control, and appropriate muscle activation   Vitals: In supine prior to activity: BP 135/53 mmHg, HR 92bpm In sitting following ambulation: BP 94/55 mmHg, HR 99bpm (symptomatic with prolonged sitting) Following seated therex: BP 94/55 mmHg (progressing symptoms) In supine: BP 139/71 mmHg, HR 93bpm (symptomatic)   Pt remains in supine with all needs within reach, call light in place, and bed alarm activated at end of session. Pt with progressive neck pain with upright and requesting pain medication and vertigo medication, RN notified during session.  Therapy Documentation Precautions:  Precautions Precautions: Fall, Cervical Required Braces or Orthoses: Cervical Brace Cervical Brace: Soft collar Other Brace: can be off in bed, can be off for bathroom trips at night Restrictions Weight Bearing Restrictions: No   Therapy/Group: Individual Therapy  Edwin Cap PT, DPT 07/28/2022, 10:46 AM

## 2022-07-28 NOTE — Progress Notes (Signed)
Physical Therapy Session Note  Patient Details  Name: Alexandria Webster MRN: [id number] Date of Birth: [birth year]  Today's Date: 07/28/2022 PT Individual Time: 1100-1200, 1300-1324 PT Individual Time Calculation (min): 60 min, 24 min   Short Term Goals: Week 1:  PT Short Term Goal 1 (Week 1): Pt will complete bed mobility with min assist PT Short Term Goal 2 (Week 1): Pt will complete stand pivot transfers with LRAD min assist PT Short Term Goal 3 (Week 1): Pt will initiate gait training PT Short Term Goal 4 (Week 1): Pt will tolerate sitting upright for 5 minutes without orthostasis  Skilled Therapeutic Interventions/Progress Updates:    pt received in bed and agreeable to therapy. No complaint of pain. Pt does report bout of orthostasis during last session and visual changes that have progressed since that time. MD made aware and in/out to discuss.   D/t new visual deficits and recent bout of orthostasis, bed level activities this session.   Pt performed bed level activities for coordination, strength, motor planning, and endurance.: Combo knee to chest>SLR>knee to chest>hook lying Pt with difficulty coordinating and recalling all steps of movement without cue Bridging with cues for slow and controlled motion for improved gross motor coordination Writing alphabet with leg in SLR position for core control and lower body coordination, x 2 BIL   Pt requested to transfer to recliner and did so with CGA and RW. Pt remained in recliner  after session and was left with all needs in reach and alarm active.   Session 2: Pt received in recliner and agreeable to therapy.  No complaint of pain. Session focused on ambulation and functional transfers for endurance and d/c planning. Pt ambulated with CGA x 150 ft and x ~100 ft with RW. Noted ataxia and reviewed previous therapist's cues. Pt recalled and was able to apply. Pt also performed car transfer with CGA. Discussed her husband's sedan will likely  be easier and safer option than daughter's SUV for transport home d/t height of vehicle. Pt then navigated ramp in the same manner without difficulty. Pt returned to room and to recliner, was left with all needs in reach and alarm active.    Therapy Documentation Precautions:  Precautions Precautions: Fall, Cervical Required Braces or Orthoses: Cervical Brace Cervical Brace: Soft collar Other Brace: can be off in bed, can be off for bathroom trips at night Restrictions Weight Bearing Restrictions: No General:      Therapy/Group: Individual Therapy  Juluis Rainier 07/28/2022, 11:36 AM

## 2022-07-28 NOTE — Progress Notes (Signed)
Occupational Therapy Session Note  Patient Details  Name: Alexandria Webster MRN: [id number] Date of Birth: [birth year]  Today's Date: 07/28/2022 OT Individual Time: 1430-1530 OT Individual Time Calculation (min): 60 min    Short Term Goals: Week 1:  OT Short Term Goal 1 (Week 1): Pt will complete sit > stand in prep for ADL with Min A using LRAD OT Short Term Goal 2 (Week 1): Pt will complete toilet transfer with min A using LRAD to promote OOB toileting OT Short Term Goal 3 (Week 1): Pt will complete thread BLE into LB clothing using AE PRN with CGA for sitting balance OT Short Term Goal 4 (Week 1): Pt will demonstrate improved recall of cervical precautions for 2 consecutive session with supervision A  Skilled Therapeutic Interventions/Progress Updates:    Pt in bed upon OT arrival and open to all treatment. Supine to sit with S, stand pivot transfer to and from w/c with CGA using RW. OT transoported pt to day room gym for dance activity. Addressed seated level AROM B LE's and Ue's within spinal precautions. Wynelle Link glasses provided due to mild light sensitivity and dizziness reduction. OT took BP and orthostatic reading (see below) asymptomatic. Once back in bed, pt provided with cold beverage and HOB elevated with final vital response below. Pt completed 1 lb bicep curls x 3 sets Bly and left bed level with needs, bed exit and nurse call button in reach.   VSR:  Bed level: BP 128/64 HR 90 W/c level light activity: BP 87/62 HR 100 Resting back bed level with HOB elevated: BP 126/66, HR 98   Pain: 2/10 neck with collar and rest for comfort  Therapy Documentation Precautions:  Precautions Precautions: Fall, Cervical Required Braces or Orthoses: Cervical Brace Cervical Brace: Soft collar Other Brace: can be off in bed, can be off for bathroom trips at night Restrictions Weight Bearing Restrictions: No   Therapy/Group: Individual Therapy  Vicenta Dunning 07/28/2022, 3:22 PM

## 2022-07-28 NOTE — Progress Notes (Signed)
PROGRESS NOTE   Subjective/Complaints:  Pt reports had a bad night- couldn't sleep due to pain.  Was also up q1 hour peeing- which is not normal for her.  She said pain meds not helpful last night, but last dose was 11:45pm- and has q4 hours- aching in neck/back.  Kept having to move.   LBM 3 days ago. Feels bloated/distended.    ROS:    Pt denies SOB, abd pain, CP, N/V/C/D, and vision changes  Except for HPI   Objective:   No results found. Recent Labs    07/25/22 1040 07/28/22 0705  WBC 6.9 6.0  HGB 9.4* 10.1*  HCT 27.5* 30.4*  PLT 255 390   Recent Labs    07/28/22 0705  NA 137  K 4.4  CL 104  CO2 26  GLUCOSE 166*  BUN 17  CREATININE 1.02*  CALCIUM 9.0    Intake/Output Summary (Last 24 hours) at 07/28/2022 0844 Last data filed at 07/27/2022 1758 Gross per 24 hour  Intake 480 ml  Output --  Net 480 ml        Physical Exam: Vital Signs Blood pressure 129/65, pulse 92, temperature 98.1 F (36.7 C), resp. rate 18, height 5\' 4"  (1.626 m), weight 53.5 kg, last menstrual period 01/21/2012, SpO2 98 %.      General: awake, alert, appropriate, Supine in bed; c/o neck pain; NAD HENT: conjugate gaze; oropharynx moist-wearing soft collar CV: regular rate and rhythm; no JVD Pulmonary: CTA B/L; no W/R/R- good air movement GI: soft, NT, ND, (+)BS- hypoactive; bloated Psychiatric: appropriate Neurological: Ox3 with cues  Skin: C/D/I. No apparent lesions.  Posterior neck surgical site covered in clean honeycomb dressing. Some tenting on hand MSK:      No apparent deformity.      Strength:                RUE: 4+ to 5-/5 throughout                LUE: 4/5 SA, 4-/5 EF, 4-/5 EE, 3+/5 WE, 3/5 FF, 3/5 FA                 RLE: 4 out of 5 throughout                LLE: 4 of 5 throughout   Neurologic exam:  Cognition: AAO to person, place, time and event.  Language: Fluent, No substitutions or neoglisms.  No dysarthria. Names 3/3 objects correctly.  Memory: Recalls 3/3 objects at 5 minutes. No apparent deficits  Insight: Good insight into current condition.  Mood: Pleasant affect, appropriate mood.  Sensation: To light touch intact in BL UEs and LEs  Reflexes: Hyporeflexic in bilateral upper extremities and right lower extremity; hyperreflexic at left patella. Negative Hoffman's and babinski signs bilaterally.  CN: 2-12 grossly intact.  Coordination: Fine motor coordination impaired and ataxia in left upper extremity Spasticity: MAS 0 in all extremities.    Assessment/Plan: 1. Functional deficits which require 3+ hours per day of interdisciplinary therapy in a comprehensive inpatient rehab setting. Physiatrist is providing close team supervision and 24 hour management of active medical problems listed below. Physiatrist and rehab  team continue to assess barriers to discharge/monitor patient progress toward functional and medical goals  Care Tool:  Bathing    Body parts bathed by patient: Right arm, Left arm, Chest, Abdomen, Face   Body parts bathed by helper: Front perineal area, Buttocks, Right upper leg, Left upper leg, Right lower leg, Left lower leg     Bathing assist Assist Level: Maximal Assistance - Patient 24 - 49%     Upper Body Dressing/Undressing Upper body dressing   What is the patient wearing?: Pull over shirt, Orthosis (cervical collar) Orthosis activity level: Performed by helper  Upper body assist Assist Level: Maximal Assistance - Patient 25 - 49%    Lower Body Dressing/Undressing Lower body dressing      What is the patient wearing?: Pants     Lower body assist Assist for lower body dressing: Maximal Assistance - Patient 25 - 49%     Toileting Toileting Toileting Activity did not occur (Clothing management and hygiene only): Refused  Toileting assist Assist for toileting: Total Assistance - Patient < 25%     Transfers Chair/bed  transfer  Transfers assist  Chair/bed transfer activity did not occur: Safety/medical concerns (orthostatic hypotension with sit<>stand)  Chair/bed transfer assist level: Contact Guard/Touching assist     Locomotion Ambulation   Ambulation assist   Ambulation activity did not occur: Safety/medical concerns (orthostatic hypotension with sit<>stand)  Assist level: Contact Guard/Touching assist Assistive device: Walker-rolling Max distance: 136ft   Walk 10 feet activity   Assist  Walk 10 feet activity did not occur: Safety/medical concerns  Assist level: Contact Guard/Touching assist Assistive device: Walker-rolling   Walk 50 feet activity   Assist Walk 50 feet with 2 turns activity did not occur: Safety/medical concerns  Assist level: Contact Guard/Touching assist Assistive device: Walker-rolling    Walk 150 feet activity   Assist Walk 150 feet activity did not occur: Safety/medical concerns         Walk 10 feet on uneven surface  activity   Assist Walk 10 feet on uneven surfaces activity did not occur: Safety/medical concerns         Wheelchair     Assist Is the patient using a wheelchair?: Yes Type of Wheelchair: Manual Wheelchair activity did not occur: Safety/medical concerns (orthostatic hypotension with sit<>stand)         Wheelchair 50 feet with 2 turns activity    Assist    Wheelchair 50 feet with 2 turns activity did not occur: Safety/medical concerns       Wheelchair 150 feet activity     Assist  Wheelchair 150 feet activity did not occur: Safety/medical concerns       Blood pressure 129/65, pulse 92, temperature 98.1 F (36.7 C), resp. rate 18, height 5\' 4"  (1.626 m), weight 53.5 kg, last menstrual period 01/21/2012, SpO2 98 %.  Medical Problem List and Plan: 1. Functional deficits secondary to cervical myelopathy s/p C5, 6, 7, T1 decompressive laminotomy with P CDF 07/19/2022 per Dr. Dutch Quint.               - Soft  cervical collar when out of bed; can be offered bathroom trips at night.             -patient may shower with surgical site covered             -ELOS/Goals: 7 to 10 days, PT/OT mod I to supervision level  D/c 5/24  Con't CIR PT and OT  BP less limiting per therapy  notes 2.  Antithrombotics: -DVT/anticoagulation:  Mechanical:  Antiembolism stockings, knee (TED hose) Bilateral lower extremities.  Check vascular study 5/14- Dopplers done 5/11- look good-              -antiplatelet therapy: Aspirin 325 mg daily 3. Pain Management: Hydrocodone as needed for pain, Valium as needed for muscle spasms  5/13- pain controlled with no meds- con't regimen 5/15- taking pain meds 2-3x/day at most and no valium lately- valium likely caused her confusion prior. 5/16- will d/c Valium- pt didn't get pain meds after 11:50 pm last night- so think she needs to take pain meds more frequently- I think that's why she hurt so much-   4. Mood/Behavior/Sleep: Trazodone 150 mg nightly.  Provide emotional support  5/13- refused trazodone, will make prn  5/16- will add Melatonin 5 mg QHS for poor sleep-              -antipsychotic agents: N/A 5. Neuropsych/cognition: This patient is? capable of making decisions on her own behalf. 6. Skin/Wound Care: Routine skin checks 7. Fluids/Electrolytes/Nutrition: Routine in and outs with follow-up chemistries 8.  COPD/quit smoking 4 years ago.  Continue albuterol as directed 9.  Hyperlipidemia.  Pravachol 10.  Hx of Hypertension with orthostatic hypotension.  Continue Lisinopril 5 mg daily, Lasix 20 mg daily         --Blood pressure soft 80s over 60s on admission; appears to have been intermittently hypotensive throughout her stay.  Receiving Lasix approximately every other day.  Will DC for now, start daily weights. 5/14-  will d/w therapy if affecting therapy- pt c/o light headedness when sits up- wearing TEDs- will stop Lisinopril and explained to pt to drink 6-8 cups/day.   5/15- will add Midodrine 8am and noon daily 5 mg- since dropped into 80s systolic with standing again this AM- of note, weight down to 51.5 kg. 5/16- Wgt 53.5 kg- which is more towards her baseline- also appears to have less drop in BP yesterday with midodrine- down to 110s vs 70s. Con't regimen  11.  Diabetes mellitus.  Latest hemoglobin A1c 9.3.  Currently SSI, continue  5.13- restarted Amaryl- wasn't taking at home.   514- Bgs looking better- 141 to 188 since started Amaryl- much better  5/15- CBGs 100-175- doing much better- con't regimen 12.  Hypothyroidism. Continue Synthroid 13.  Constipation. Continue Colace 100 mg daily, Dulcolax suppository daily as needed, MiraLAX daily as needed  5.13- sorbitol 45cc- if no results, wrote for Soap suds enema.   5/14- very large BM last night after sorbitol- didn't need enema  5/16- LBM 3 days ago- will add Miralax as well as Sorbitol 30cc x1  14. Low grade fever  5/14- T-100 degrees this AM- felt dizzy, but sounds like not fevrish Sx's- got tylenol- feels better- U/A and CXR yesterday negative- could be atelectasis- so added flutter valve. Dopplers also negative- asked pt to drink 6-8 cups/day of water  5/15- T 99.7 this AM, but not feeling badly. Will check labs again in AM  5/16- no elevated temp   I spent a total of 41   minutes on total care today- >50% coordination of care- due to  D/w nursing x2 and prolonged d/w pt about bowels and pain control - also reviewed therapy notes due to orthostatic hypotension.    LOS: 6 days A FACE TO FACE EVALUATION WAS PERFORMED  Alexandria Webster 07/28/2022, 8:44 AM

## 2022-07-29 LAB — GLUCOSE, CAPILLARY
Glucose-Capillary: 198 mg/dL — ABNORMAL HIGH (ref 70–99)
Glucose-Capillary: 101 mg/dL — ABNORMAL HIGH (ref 70–99)
Glucose-Capillary: 176 mg/dL — ABNORMAL HIGH (ref 70–99)
Glucose-Capillary: 104 mg/dL — ABNORMAL HIGH (ref 70–99)

## 2022-07-29 MED ORDER — INSULIN ASPART 100 UNIT/ML IJ SOLN
0.0000 [IU] | Freq: Three times a day (TID) | INTRAMUSCULAR | Status: DC
  Administered 2022-07-29 – 2022-07-31 (×7): 2 [IU] via SUBCUTANEOUS
  Administered 2022-08-01 (×2): 1 [IU] via SUBCUTANEOUS
  Administered 2022-08-02: 2 [IU] via SUBCUTANEOUS
  Administered 2022-08-02 – 2022-08-03 (×2): 1 [IU] via SUBCUTANEOUS
  Administered 2022-08-03: 3 [IU] via SUBCUTANEOUS
  Administered 2022-08-04: 1 [IU] via SUBCUTANEOUS
  Administered 2022-08-04 – 2022-08-05 (×2): 2 [IU] via SUBCUTANEOUS

## 2022-07-29 MED ORDER — SORBITOL 70 % SOLN
45.0000 mL | Freq: Once | Status: AC
  Administered 2022-07-29: 45 mL via ORAL
  Filled 2022-07-29: qty 60

## 2022-07-29 NOTE — Progress Notes (Signed)
Occupational Therapy Weekly Progress Note  Patient Details  Name: Alexandria Webster MRN: [id number] Date of Birth: [birth year]  Beginning of progress report period: Jul 23, 2022 End of progress report period: Jul 29, 2022  Today's Date: 07/29/2022 OT Individual Time: 3086-5784 OT Individual Time Calculation (min): 58 min    Patient has met 4 of 4 short term goals.    Patient continues to demonstrate the following deficits: muscle weakness and muscle paralysis and therefore will continue to benefit from skilled OT intervention to enhance overall performance with BADL.  Patient progressing toward long term goals..  Continue plan of care.  OT Short Term Goals Week 1:  OT Short Term Goal 1 (Week 1): Pt will complete sit > stand in prep for ADL with Min A using LRAD- Met OT Short Term Goal 2 (Week 1): Pt will complete toilet transfer with min A using LRAD to promote OOB toileting- Met OT Short Term Goal 3 (Week 1): Pt will complete thread BLE into LB clothing using AE PRN with CGA for sitting balance Met OT Short Term Goal 4 (Week 1): Pt will demonstrate improved recall of cervical precautions for 2 consecutive session with supervision A. Met  Week 2: Progress towards LTG's   Skilled Therapeutic Interventions/Progress Updates: Patient seen for assessment of self care skills and safety. Patient agreeable to OT treatment working on ADLs including transfers, bathing and dressing. See full levels listed below. Patient with good safety using the RW. Impaired largely due to low vision during shower transfer. Patient responded well to CGA and vc's for path finding. Continue progressing towards LTG's. Patients family present during treatment session and reported feeling comfortable assisting as needed with patient's current level of function. Patient and family encouraged to let staff know if there are any obstacles that would be beneficial to focus on. Patient's family reports concern with stairs and  path in the living room that will require side stepping while using the walker. Good participation and motivation to progress towards LTG's. Continue with skilled treatment plan.     Therapy Documentation Precautions:  Precautions Precautions: Fall, Cervical Required Braces or Orthoses: Cervical Brace Cervical Brace: Soft collar Other Brace: can be off in bed, can be off for bathroom trips at night Restrictions Weight Bearing Restrictions: No   Pain:No c/o pain   ADL: ADL Eating: set up assistance Where Assessed-Eating: Bed level Grooming: set-up assistance Where Assessed-Grooming: edge of bed Upper Body Bathing: Supervision/safety,  Where Assessed-Upper Body Bathing: shower, seated Lower Body Bathing: Min A Where Assessed-Lower Body Bathing: shower seated Upper Body Dressing: Set up with pull over shirt, Min with collar (including shirt/cervical collar) Where Assessed-Upper Body Dressing: Edge of bed Lower Body Dressing: Min Assist Where Assessed-Lower Body Dressing: Edge of bed Toileting: Printmaker: Printmaker Method: ambulating with RW Psychologist, counselling Transfer: Investment banker, corporate Method: ambulating with RW    Therapy/Group: Individual Therapy  Warnell Forester 07/29/2022, 12:15 PM

## 2022-07-29 NOTE — Progress Notes (Signed)
PROGRESS NOTE   Subjective/Complaints:  LBM 4 days ago- no results with Sorbitol 30 cc- will give more 45 cc and see if that works- if not, will do soap suds enema.   Pt reports pain 10/10 this AM- hasn't taken pain meds since last evening- educated that when gets out of control, might take a few times q4 hours to get under better control-    ROS:   Pt denies SOB, abd pain, CP, N/V/ (+)C/D, and vision changes   Except for HPI   Objective:   No results found. Recent Labs    07/28/22 0705  WBC 6.0  HGB 10.1*  HCT 30.4*  PLT 390   Recent Labs    07/28/22 0705  NA 137  K 4.4  CL 104  CO2 26  GLUCOSE 166*  BUN 17  CREATININE 1.02*  CALCIUM 9.0    Intake/Output Summary (Last 24 hours) at 07/29/2022 0841 Last data filed at 07/28/2022 1814 Gross per 24 hour  Intake 474 ml  Output --  Net 474 ml        Physical Exam: Vital Signs Blood pressure (!) 158/65, pulse 76, temperature 98.9 F (37.2 C), resp. rate 16, height 5\' 4"  (1.626 m), weight 53.1 kg, last menstrual period 01/21/2012, SpO2 99 %.       General: awake, alert, appropriate, supine in bed; appears in pain; NAD HENT: conjugate gaze; oropharynx moist CV: regular rate and rhythm;  no JVD Pulmonary: CTA B/L; no W/R/R- good air movement GI: soft, NT, distended; bloated with hypoactive BS Psychiatric: appropriate but in pain Neurological: Ox3   Skin: C/D/I. No apparent lesions.  Posterior neck surgical site covered in clean honeycomb dressing. Some tenting on hand MSK:      No apparent deformity.      Strength:                RUE: 4+ to 5-/5 throughout                LUE: 4/5 SA, 4-/5 EF, 4-/5 EE, 3+/5 WE, 3/5 FF, 3/5 FA                 RLE: 4 out of 5 throughout                LLE: 4 of 5 throughout   Neurologic exam:  Cognition: AAO to person, place, time and event.  Language: Fluent, No substitutions or neoglisms. No dysarthria.  Names 3/3 objects correctly.  Memory: Recalls 3/3 objects at 5 minutes. No apparent deficits  Insight: Good insight into current condition.  Mood: Pleasant affect, appropriate mood.  Sensation: To light touch intact in BL UEs and LEs  Reflexes: Hyporeflexic in bilateral upper extremities and right lower extremity; hyperreflexic at left patella. Negative Hoffman's and babinski signs bilaterally.  CN: 2-12 grossly intact.  Coordination: Fine motor coordination impaired and ataxia in left upper extremity Spasticity: MAS 0 in all extremities.    Assessment/Plan: 1. Functional deficits which require 3+ hours per day of interdisciplinary therapy in a comprehensive inpatient rehab setting. Physiatrist is providing close team supervision and 24 hour management of active medical problems listed below. Physiatrist  and rehab team continue to assess barriers to discharge/monitor patient progress toward functional and medical goals  Care Tool:  Bathing    Body parts bathed by patient: Right arm, Left arm, Chest, Abdomen, Face   Body parts bathed by helper: Front perineal area, Buttocks, Right upper leg, Left upper leg, Right lower leg, Left lower leg     Bathing assist Assist Level: Maximal Assistance - Patient 24 - 49%     Upper Body Dressing/Undressing Upper body dressing   What is the patient wearing?: Pull over shirt, Orthosis (cervical collar) Orthosis activity level: Performed by helper  Upper body assist Assist Level: Maximal Assistance - Patient 25 - 49%    Lower Body Dressing/Undressing Lower body dressing      What is the patient wearing?: Pants     Lower body assist Assist for lower body dressing: Maximal Assistance - Patient 25 - 49%     Toileting Toileting Toileting Activity did not occur (Clothing management and hygiene only): Refused  Toileting assist Assist for toileting: Total Assistance - Patient < 25%     Transfers Chair/bed transfer  Transfers assist   Chair/bed transfer activity did not occur: Safety/medical concerns (orthostatic hypotension with sit<>stand)  Chair/bed transfer assist level: Contact Guard/Touching assist     Locomotion Ambulation   Ambulation assist   Ambulation activity did not occur: Safety/medical concerns (orthostatic hypotension with sit<>stand)  Assist level: Contact Guard/Touching assist Assistive device: Walker-rolling Max distance: 156ft   Walk 10 feet activity   Assist  Walk 10 feet activity did not occur: Safety/medical concerns  Assist level: Contact Guard/Touching assist Assistive device: Walker-rolling   Walk 50 feet activity   Assist Walk 50 feet with 2 turns activity did not occur: Safety/medical concerns  Assist level: Contact Guard/Touching assist Assistive device: Walker-rolling    Walk 150 feet activity   Assist Walk 150 feet activity did not occur: Safety/medical concerns         Walk 10 feet on uneven surface  activity   Assist Walk 10 feet on uneven surfaces activity did not occur: Safety/medical concerns         Wheelchair     Assist Is the patient using a wheelchair?: Yes Type of Wheelchair: Manual Wheelchair activity did not occur: Safety/medical concerns (orthostatic hypotension with sit<>stand)         Wheelchair 50 feet with 2 turns activity    Assist    Wheelchair 50 feet with 2 turns activity did not occur: Safety/medical concerns       Wheelchair 150 feet activity     Assist  Wheelchair 150 feet activity did not occur: Safety/medical concerns       Blood pressure (!) 158/65, pulse 76, temperature 98.9 F (37.2 C), resp. rate 16, height 5\' 4"  (1.626 m), weight 53.1 kg, last menstrual period 01/21/2012, SpO2 99 %.  Medical Problem List and Plan: 1. Functional deficits secondary to cervical myelopathy s/p C5, 6, 7, T1 decompressive laminotomy with P CDF 07/19/2022 per Dr. Dutch Quint.               - Soft cervical collar when out of  bed; can be offered bathroom trips at night.             -patient may shower with surgical site covered             -ELOS/Goals: 7 to 10 days, PT/OT mod I to supervision level  D/c 5/24  Con't CIR PT and OT  Low Cbgs  x2 yesterday-  2.  Antithrombotics: -DVT/anticoagulation:  Mechanical:  Antiembolism stockings, knee (TED hose) Bilateral lower extremities.  Check vascular study 5/14- Dopplers done 5/11- look good-              -antiplatelet therapy: Aspirin 325 mg daily 3. Pain Management: Hydrocodone as needed for pain, Valium as needed for muscle spasms  5/13- pain controlled with no meds- con't regimen 5/15- taking pain meds 2-3x/day at most and no valium lately- valium likely caused her confusion prior. 5/16- will d/c Valium- pt didn't get pain meds after 11:50 pm last night- so think she needs to take pain meds more frequently- I think that's why she hurt so much-  5/17- educated pt when gets up to 10/10, needs to take pain meds q4 hours for a few times to get back under control- she voiced understanding.   4. Mood/Behavior/Sleep: Trazodone 150 mg nightly.  Provide emotional support  5/13- refused trazodone, will make prn  5/16- will add Melatonin 5 mg QHS for poor sleep- 5/17- slept better but no pain meds overnight, so hurting this AM              -antipsychotic agents: N/A 5. Neuropsych/cognition: This patient is? capable of making decisions on her own behalf. 6. Skin/Wound Care: Routine skin checks 7. Fluids/Electrolytes/Nutrition: Routine in and outs with follow-up chemistries 8.  COPD/quit smoking 4 years ago.  Continue albuterol as directed 9.  Hyperlipidemia.  Pravachol 10.  Hx of Hypertension with orthostatic hypotension.  Continue Lisinopril 5 mg daily, Lasix 20 mg daily         --Blood pressure soft 80s over 60s on admission; appears to have been intermittently hypotensive throughout her stay.  Receiving Lasix approximately every other day.  Will DC for now, start daily  weights. 5/14-  will d/w therapy if affecting therapy- pt c/o light headedness when sits up- wearing TEDs- will stop Lisinopril and explained to pt to drink 6-8 cups/day.  5/15- will add Midodrine 8am and noon daily 5 mg- since dropped into 80s systolic with standing again this AM- of note, weight down to 51.5 kg. 5/16- Wgt 53.5 kg- which is more towards her baseline- also appears to have less drop in BP yesterday with midodrine- down to 110s vs 70s. Con't regimen  5/17- BP running supine 130s-150s- however drops when sits up- not dry on BMP yesterday 11.  Diabetes mellitus.  Latest hemoglobin A1c 9.3.  Currently SSI, continue  5.13- restarted Amaryl- wasn't taking at home.   514- Bgs looking better- 141 to 188 since started Amaryl- much better  5/15- CBGs 100-175- doing much better- con't regimen  5/17- CBGs dropped to 60s 2 separate times yesterday- will decrease SSI to sensitive from moderate- to be less aggressive.  12.  Hypothyroidism. Continue Synthroid 13.  Constipation. Continue Colace 100 mg daily, Dulcolax suppository daily as needed, MiraLAX daily as needed  5.13- sorbitol 45cc- if no results, wrote for Soap suds enema.   5/14- very large BM last night after sorbitol- didn't need enema  5/16- LBM 3 days ago- will add Miralax as well as Sorbitol 30cc x1  5/17- will give 45 cc Sorbitol and if no BM, give soap suds enema  14. Low grade fever  5/14- T-100 degrees this AM- felt dizzy, but sounds like not fevrish Sx's- got tylenol- feels better- U/A and CXR yesterday negative- could be atelectasis- so added flutter valve. Dopplers also negative- asked pt to drink 6-8 cups/day of water  5/15-  T 99.7 this AM, but not feeling badly. Will check labs again in AM  5/16- no elevated temp   I spent a total of 43   minutes on total care today- >50% coordination of care- due to  Pt d/w about pain and educated about pain control; and d/w nursing about SSI/CBG's and bowels.   LOS: 7 days A FACE  TO FACE EVALUATION WAS PERFORMED  Alexandria Webster 07/29/2022, 8:41 AM

## 2022-07-29 NOTE — Progress Notes (Signed)
Physical Therapy Session Note  Patient Details  Name: Alexandria Webster MRN: [id number] Date of Birth: 27-Oct-1965  Today's Date: 07/29/2022 PT Individual Time: 0805-0900 PT Individual Time Calculation (min): 55 min   Short Term Goals: Week 1:  PT Short Term Goal 1 (Week 1): Pt will complete bed mobility with min assist PT Short Term Goal 1 - Progress (Week 1): Met PT Short Term Goal 2 (Week 1): Pt will complete stand pivot transfers with LRAD min assist PT Short Term Goal 2 - Progress (Week 1): Met PT Short Term Goal 3 (Week 1): Pt will initiate gait training PT Short Term Goal 3 - Progress (Week 1): Met PT Short Term Goal 4 (Week 1): Pt will tolerate sitting upright for 5 minutes without orthostasis PT Short Term Goal 4 - Progress (Week 1): Met  Skilled Therapeutic Interventions/Progress Updates: Pt presented in bed agreeable to therapy. Pt c/o mild incisional pain did not rate and stated premedicated. BP monitored as below with one occurrence of pt feeling lightheaded in sitting but pt returned to supine prior to BP assessment. Session focused on transfers, therex, conditioning and ambulation. PTA donned TED hose total A for time management with pt voicing concern due to one toenail that she states tends to catch and has become painful. Pt states that dgt usually maintains toenails but hasn't been able to recently. Pt asked if dgt could bring in clippers, checked with Alexandria Pih, RN who stated was ok for dgt to bring in clippers as dgt had been performing care previously. Pt then performed supine to sit with supervision and use of bed features. PTA obtained sweater for pt in closet at that time pt returned to supine due to lightheadedness with vitals unable to be obtained. Due to possible hypotension, performed the following therex prior to transfer back to sitting: Ankle pumps, SLR, hip abd/add, heel slides, shoulder horizontal abd with yellow theraband, and D1 PNF with yellow theraband all performed  x 10 bilaterally. Pt then transferred back to sitting with pt stating no symptoms and requesting to use bathroom. Performed ambulatory transfer to bathroom with RW and CGA with CGA for toilet transfers and continent urinary void. Pt requesting assistance for pulling up brief once completed due to poor fit. Pt then ambulated to sink and complete hand hygiene in standing then sat in w/c once completed. After brief rest pt ambulated to ortho gym >135ft with CGA with noted heavy L heel strike. Pt requiring seated rest once in gym. Pt then transferred to NuStep and participated in NuStep activity L3 x 6 min using x 4 extremities for general conditioning. Pt transferred back to w/c via step pivot transfer once completed and transported back to room due to fatigue. Performed ambulatory transfer back to bed and sit to supine with supervision with HOB elevated. Pt left in bed at end of session with bed alarm on, call bell within reach and needs met.    Supine 122/56 (75) HR 82 Sitting after therex 117/49 (68) HR 89 After ambulation 129/86 (96) HR 95 Balance/vestibular training;Ambulation/gait training;Community reintegration;DME/adaptive equipment instruction;Neuromuscular re-education;UE/LE Strength taining/ROM;Stair training;Discharge planning;Functional electrical stimulation;Pain management;Therapeutic Activities;UE/LE Coordination activities;Disease management/prevention;Functional mobility training;Patient/family education;Therapeutic Exercise;Visual/perceptual remediation/compensation   Therapy Documentation Precautions:  Precautions Precautions: Fall, Cervical Required Braces or Orthoses: Cervical Brace Cervical Brace: Soft collar Other Brace: can be off in bed, can be off for bathroom trips at night Restrictions Weight Bearing Restrictions: No General:   Vital Signs: Therapy Vitals Temp: 98 F (36.7 C) Temp Source: Oral  Pulse Rate: 82 Resp: 18 BP: (!) 165/71 Patient Position (if  appropriate): Lying Oxygen Therapy SpO2: 99 % O2 Device: Room Air Pain: Pain Assessment Pain Score: 2  Mobility:   Locomotion :    Trunk/Postural Assessment :    Balance:   Exercises:   Other Treatments:      Therapy/Group: Individual Therapy  Alexandria Webster 07/29/2022, 5:01 PM

## 2022-07-29 NOTE — Progress Notes (Signed)
Educated patient on monitoring BS and insulin administration. Patient states her A1c was within normal range and Dr. Renette Butters changed her medications. Patient states she has a glucometer at home already, and is aware of how to monitor and manage her diabetes. States she has been Diabetic over 20 years and understands she has to start eating healthier and monitoring BS again.

## 2022-07-29 NOTE — Progress Notes (Signed)
Hypoglycemic Event  CBG: 62  Treatment: 4 oz juice/soda  Symptoms: Sweaty  Follow-up CBG: Time:2107 CBG Result:60  Possible Reasons for Event: Unknown  Comments/MD notified:Dan Angiuli    Kambrea Carrasco, [employer]

## 2022-07-29 NOTE — Progress Notes (Signed)
Occupational Therapy Session Note  Patient Details  Name: Alexandria Webster MRN: [id number] Date of Birth: [birth year]  Today's Date: 07/29/2022 OT Individual Time: 1300-1345 OT Individual Time Calculation (min): 45 min    Short Term Goals: Week 1:  OT Short Term Goal 1 (Week 1): Pt will complete sit > stand in prep for ADL with Min A using LRAD OT Short Term Goal 1 - Progress (Week 1): Met OT Short Term Goal 2 (Week 1): Pt will complete toilet transfer with min A using LRAD to promote OOB toileting OT Short Term Goal 2 - Progress (Week 1): Met OT Short Term Goal 3 (Week 1): Pt will complete thread BLE into LB clothing using AE PRN with CGA for sitting balance OT Short Term Goal 3 - Progress (Week 1): Met OT Short Term Goal 4 (Week 1): Pt will demonstrate improved recall of cervical precautions for 2 consecutive session with supervision A OT Short Term Goal 4 - Progress (Week 1): Met  Skilled Therapeutic Interventions/Progress Updates:   Pt bed level upon OT arrival. Denies dizziness. Applied knee high TEDs to asddress BP changes. Pt able to move to EOB and transfer via stand pivot with RW to w/c with close S. Pt transported off unit for 1st time w/c level for well being and to address accessing community. Pt able to self propel w/vc 4 sets of 25 ft with short rests through gift shop. OT then transported pt to apt kitchen and issued RW bag. Amb with CGA 25 ft x 2 sets for accessing fridge for beverage retrieval then back to w/c using RW bag. Pt then amb hallway and requested back to bed with CGA and RW and S to position self bed level. VSR BP 126/78 and HR 88. Pt left with bed alarm engaged and all needs in reach.    Pain:  Denies even in neck throughout session   Therapy Documentation Precautions:  Precautions Precautions: Fall, Cervical Required Braces or Orthoses: Cervical Brace Cervical Brace: Soft collar Other Brace: can be off in bed, can be off for bathroom trips at  night Restrictions Weight Bearing Restrictions: No General:   Vital Signs: Therapy Vitals Temp: 98.9 F (37.2 C) Pulse Rate: 76 Resp: 16 BP: (!) 158/65 Patient Position (if appropriate): Lying Oxygen Therapy SpO2: 99 % O2 Device: Room Air Pain:   ADL: ADL Eating: Minimal assistance Where Assessed-Eating: Bed level Grooming: Minimal assistance Where Assessed-Grooming: Bed level Upper Body Bathing: Supervision/safety, Minimal cueing Where Assessed-Upper Body Bathing: Bed level Lower Body Bathing: Maximal assistance, Maximal cueing Where Assessed-Lower Body Bathing: Bed level Upper Body Dressing: Maximal assistance (including shirt/cervical collar) Where Assessed-Upper Body Dressing: Edge of bed Lower Body Dressing: Maximal assistance Where Assessed-Lower Body Dressing: Bed level Toileting: Unable to assess (declined) Toilet Transfer: Unable to assess (limited by lethargy/dizziness) Toilet Transfer Method: Unable to assess Tub/Shower Transfer: Unable to assess (limited by lethargy/dizziness) Tub/Shower Transfer Method: Unable to assess Film/video editor: Unable to assess (limited by lethargy/dizziness) Film/video editor Method: Unable to assess Vision   Perception    Praxis   Balance   Exercises:   Other Treatments:     Therapy/Group: Individual Therapy  Vicenta Dunning 07/29/2022, 7:56 AM

## 2022-07-29 NOTE — Progress Notes (Signed)
Hypoglycemic Event  CBG: 60  Treatment: 8 oz juice/soda  Symptoms: Sweaty  Follow-up CBG: Time:2144 CBG Result:97  Possible Reasons for Event: Unknown  Comments/MD notified:Dan Angiuli    Delesia Martinek, [employer]

## 2022-07-29 NOTE — Progress Notes (Signed)
Attempted to meet with patient for follow up on discharge. Patient resting, will try again later.

## 2022-07-29 NOTE — Progress Notes (Signed)
Physical Therapy Weekly Progress Note  Patient Details  Name: Alexandria Webster MRN: [id number] Date of Birth: February 28, 1966  Beginning of progress report period: Jul 23, 2022 End of progress report period: Jul 29, 2022  Today's Date: 07/29/2022 PT Individual Time: 4098-1191 PT Individual Time Calculation (min): 39 min   Patient has met 4 of 4 short term goals.  Pt is progressing well toward goals. She is most limited by pain and endurance deficits. Pt is ambulating up to 120 ft with RW and CGA.  Patient continues to demonstrate the following deficits muscle weakness, unbalanced muscle activation and ataxia, and decreased balance strategies and difficulty maintaining precautions and therefore will continue to benefit from skilled PT intervention to increase functional independence with mobility.  Patient progressing toward long term goals..  Plan of care revisions: upgraded gait and stair goals d/t progress.  PT Short Term Goals Week 1:  PT Short Term Goal 1 (Week 1): Pt will complete bed mobility with min assist PT Short Term Goal 1 - Progress (Week 1): Met PT Short Term Goal 2 (Week 1): Pt will complete stand pivot transfers with LRAD min assist PT Short Term Goal 2 - Progress (Week 1): Met PT Short Term Goal 3 (Week 1): Pt will initiate gait training PT Short Term Goal 3 - Progress (Week 1): Met PT Short Term Goal 4 (Week 1): Pt will tolerate sitting upright for 5 minutes without orthostasis PT Short Term Goal 4 - Progress (Week 1): Met Week 2:  PT Short Term Goal 1 (Week 2): =LTGs d/t ELOS  Skilled Therapeutic Interventions/Progress Updates:  pt received in bed and agreeable to therapy. Pt reports pain better controlled today. Bed mobility with supervision.   Pt ambulated x >150 to therapy gym and 2 x 110 ft during session. Noted knee hyperextension, addressed with standing NMR.  Step taps and squats for improved knee control in standing. Pt required visual, tactile, and verbal cueing  to achieve.   Pt then reports need to return to room for BM. Pt transported back to room. CGA ambulatory transfer to bathroom. No BM but + bladder void. Supervision for 3/3 toileting tasks.   Pt then reported extreme fatigue. Assisted back to bed, was left with all needs in reach and alarm active.    Therapy Documentation Precautions:  Precautions Precautions: Fall, Cervical Required Braces or Orthoses: Cervical Brace Cervical Brace: Soft collar Other Brace: can be off in bed, can be off for bathroom trips at night Restrictions Weight Bearing Restrictions: No General:      Therapy/Group: Individual Therapy  Juluis Rainier 07/29/2022, 4:04 PM

## 2022-07-30 LAB — GLUCOSE, CAPILLARY
Glucose-Capillary: 195 mg/dL — ABNORMAL HIGH (ref 70–99)
Glucose-Capillary: 196 mg/dL — ABNORMAL HIGH (ref 70–99)
Glucose-Capillary: 158 mg/dL — ABNORMAL HIGH (ref 70–99)
Glucose-Capillary: 230 mg/dL — ABNORMAL HIGH (ref 70–99)

## 2022-07-30 NOTE — Progress Notes (Signed)
Educated patient on monitoring her carbohydrates for DM. Patient understands to measure her portions, understands serving size and to continue with her exercise program. Patient was guided through education folder on Carbohydrates counting for DM. No questions at this time.

## 2022-07-30 NOTE — Progress Notes (Signed)
PROGRESS NOTE   Subjective/Complaints:  Pt reports 0/10 pain this AM with taking meds more frequently. Working much better  Needed supp to have BM last evening- used miralax at home which worked well.   Knows about insulin- doesn't need training.  ROS:    Pt denies SOB, abd pain, CP, N/V/C/D, and vision changes   Except for HPI   Objective:   No results found. Recent Labs    07/28/22 0705  WBC 6.0  HGB 10.1*  HCT 30.4*  PLT 390   Recent Labs    07/28/22 0705  NA 137  K 4.4  CL 104  CO2 26  GLUCOSE 166*  BUN 17  CREATININE 1.02*  CALCIUM 9.0    Intake/Output Summary (Last 24 hours) at 07/30/2022 1218 Last data filed at 07/30/2022 0735 Gross per 24 hour  Intake 360 ml  Output --  Net 360 ml        Physical Exam: Vital Signs Blood pressure (!) 147/58, pulse 85, temperature 98 F (36.7 C), temperature source Oral, resp. rate 18, height 5\' 4"  (1.626 m), weight 52.3 kg, last menstrual period 01/21/2012, SpO2 98 %.        General: awake, alert, appropriate, supine in bed; no neck pain this AM, per pt; NAD HENT: conjugate gaze; oropharynx moist-wearing soft collar CV: regular rate and rhythm; no JVD Pulmonary: CTA B/L; no W/R/R- good air movement GI: soft, NT, ND, (+)BS- more normoactive Psychiatric: appropriate Neurological: Ox3  Skin: C/D/I. No apparent lesions.  Posterior neck surgical site covered in clean honeycomb dressing. Some tenting on hand MSK:      No apparent deformity.      Strength:                RUE: 4+ to 5-/5 throughout                LUE: 4/5 SA, 4-/5 EF, 4-/5 EE, 3+/5 WE, 3/5 FF, 3/5 FA                 RLE: 4 out of 5 throughout                LLE: 4 of 5 throughout   Neurologic exam:  Cognition: AAO to person, place, time and event.  Language: Fluent, No substitutions or neoglisms. No dysarthria. Names 3/3 objects correctly.  Memory: Recalls 3/3 objects at 5  minutes. No apparent deficits  Insight: Good insight into current condition.  Mood: Pleasant affect, appropriate mood.  Sensation: To light touch intact in BL UEs and LEs  Reflexes: Hyporeflexic in bilateral upper extremities and right lower extremity; hyperreflexic at left patella. Negative Hoffman's and babinski signs bilaterally.  CN: 2-12 grossly intact.  Coordination: Fine motor coordination impaired and ataxia in left upper extremity Spasticity: MAS 0 in all extremities.    Assessment/Plan: 1. Functional deficits which require 3+ hours per day of interdisciplinary therapy in a comprehensive inpatient rehab setting. Physiatrist is providing close team supervision and 24 hour management of active medical problems listed below. Physiatrist and rehab team continue to assess barriers to discharge/monitor patient progress toward functional and medical goals  Care Tool:  Bathing  Body parts bathed by patient: Right arm, Left arm, Chest, Abdomen, Front perineal area, Right upper leg, Left upper leg, Right lower leg, Left lower leg, Face   Body parts bathed by helper: Front perineal area, Buttocks, Right upper leg, Left upper leg, Right lower leg, Left lower leg     Bathing assist Assist Level: Minimal Assistance - Patient > 75%     Upper Body Dressing/Undressing Upper body dressing   What is the patient wearing?: Pull over shirt Orthosis activity level: Performed by helper  Upper body assist Assist Level: Set up assist    Lower Body Dressing/Undressing Lower body dressing      What is the patient wearing?: Underwear/pull up, Pants     Lower body assist Assist for lower body dressing: Minimal Assistance - Patient > 75%     Toileting Toileting Toileting Activity did not occur Press photographer and hygiene only): Refused  Toileting assist Assist for toileting: Contact Guard/Touching assist     Transfers Chair/bed transfer  Transfers assist  Chair/bed transfer  activity did not occur: Safety/medical concerns (orthostatic hypotension with sit<>stand)  Chair/bed transfer assist level: Contact Guard/Touching assist     Locomotion Ambulation   Ambulation assist   Ambulation activity did not occur: Safety/medical concerns (orthostatic hypotension with sit<>stand)  Assist level: Contact Guard/Touching assist Assistive device: Walker-rolling Max distance: 142ft   Walk 10 feet activity   Assist  Walk 10 feet activity did not occur: Safety/medical concerns  Assist level: Contact Guard/Touching assist Assistive device: Walker-rolling   Walk 50 feet activity   Assist Walk 50 feet with 2 turns activity did not occur: Safety/medical concerns  Assist level: Contact Guard/Touching assist Assistive device: Walker-rolling    Walk 150 feet activity   Assist Walk 150 feet activity did not occur: Safety/medical concerns         Walk 10 feet on uneven surface  activity   Assist Walk 10 feet on uneven surfaces activity did not occur: Safety/medical concerns         Wheelchair     Assist Is the patient using a wheelchair?: Yes Type of Wheelchair: Manual Wheelchair activity did not occur: Safety/medical concerns (orthostatic hypotension with sit<>stand)         Wheelchair 50 feet with 2 turns activity    Assist    Wheelchair 50 feet with 2 turns activity did not occur: Safety/medical concerns       Wheelchair 150 feet activity     Assist  Wheelchair 150 feet activity did not occur: Safety/medical concerns       Blood pressure (!) 147/58, pulse 85, temperature 98 F (36.7 C), temperature source Oral, resp. rate 18, height 5\' 4"  (1.626 m), weight 52.3 kg, last menstrual period 01/21/2012, SpO2 98 %.  Medical Problem List and Plan: 1. Functional deficits secondary to cervical myelopathy s/p C5, 6, 7, T1 decompressive laminotomy with P CDF 07/19/2022 per Dr. Dutch Quint.               - Soft cervical collar when out  of bed; can be offered bathroom trips at night.             -patient may shower with surgical site covered             -ELOS/Goals: 7 to 10 days, PT/OT mod I to supervision level  D/c 5/24  Con't CIR PT and OT  2.  Antithrombotics: -DVT/anticoagulation:  Mechanical:  Antiembolism stockings, knee (TED hose) Bilateral lower extremities.  Check vascular  study 5/14- Dopplers done 5/11- look good-              -antiplatelet therapy: Aspirin 325 mg daily 3. Pain Management: Hydrocodone as needed for pain, Valium as needed for muscle spasms  5/13- pain controlled with no meds- con't regimen 5/15- taking pain meds 2-3x/day at most and no valium lately- valium likely caused her confusion prior. 5/16- will d/c Valium- pt didn't get pain meds after 11:50 pm last night- so think she needs to take pain meds more frequently- I think that's why she hurt so much-  5/17- educated pt when gets up to 10/10, needs to take pain meds q4 hours for a few times to get back under control- she voiced understanding.   5/18- pain 0/10 this AM- with pain meds more regularly.  4. Mood/Behavior/Sleep: Trazodone 150 mg nightly.  Provide emotional support  5/13- refused trazodone, will make prn  5/16- will add Melatonin 5 mg QHS for poor sleep- 5/17- slept better but no pain meds overnight, so hurting this AM              -antipsychotic agents: N/A 5. Neuropsych/cognition: This patient is? capable of making decisions on her own behalf. 6. Skin/Wound Care: Routine skin checks 7. Fluids/Electrolytes/Nutrition: Routine in and outs with follow-up chemistries 8.  COPD/quit smoking 4 years ago.  Continue albuterol as directed 9.  Hyperlipidemia.  Pravachol 10.  Hx of Hypertension with orthostatic hypotension.  Continue Lisinopril 5 mg daily, Lasix 20 mg daily         --Blood pressure soft 80s over 60s on admission; appears to have been intermittently hypotensive throughout her stay.  Receiving Lasix approximately every other day.   Will DC for now, start daily weights. 5/14-  will d/w therapy if affecting therapy- pt c/o light headedness when sits up- wearing TEDs- will stop Lisinopril and explained to pt to drink 6-8 cups/day.  5/15- will add Midodrine 8am and noon daily 5 mg- since dropped into 80s systolic with standing again this AM- of note, weight down to 51.5 kg. 5/16- Wgt 53.5 kg- which is more towards her baseline- also appears to have less drop in BP yesterday with midodrine- down to 110s vs 70s. Con't regimen  5/17- BP running supine 130s-150s- however drops when sits up- not dry on BMP yesterday  5/18- 125 to 160s- systolic- still has orthostatic hypotension, so will not be more aggressive with BP.  11.  Diabetes mellitus.  Latest hemoglobin A1c 9.3.  Currently SSI, continue  5.13- restarted Amaryl- wasn't taking at home.   514- Bgs looking better- 141 to 188 since started Amaryl- much better  5/15- CBGs 100-175- doing much better- con't regimen  5/17- CBGs dropped to 60s 2 separate times yesterday- will decrease SSI to sensitive from moderate- to be less aggressive.   5/18- CBGs 1-1=198= due to decreased SSI- will keep an eye on her CBGs and titrate as required/monitor trend.  12.  Hypothyroidism. Continue Synthroid 13.  Constipation. Continue Colace 100 mg daily, Dulcolax suppository daily as needed, MiraLAX daily as needed  5.13- sorbitol 45cc- if no results, wrote for Soap suds enema.   5/14- very large BM last night after sorbitol- didn't need enema  5/16- LBM 3 days ago- will add Miralax as well as Sorbitol 30cc x1  5/17- will give 45 cc Sorbitol and if no BM, give soap suds enema  5/18- had multiple BM's- had suppository- refused enema.   14. Low grade fever  5/14- T-100  degrees this AM- felt dizzy, but sounds like not fevrish Sx's- got tylenol- feels better- U/A and CXR yesterday negative- could be atelectasis- so added flutter valve. Dopplers also negative- asked pt to drink 6-8 cups/day of  water  5/15- T 99.7 this AM, but not feeling badly. Will check labs again in AM  5/16- no elevated temp  5/18- no more low grade temps/resolved   I spent a total of  41  minutes on total care today- >50% coordination of care- due to  D/w pt about pain control and insulin dosing as well as bowels- also d/w nursing at length about same issues  LOS: 8 days A FACE TO FACE EVALUATION WAS PERFORMED  Alexandria Webster 07/30/2022, 12:18 PM

## 2022-07-31 LAB — GLUCOSE, CAPILLARY
Glucose-Capillary: 159 mg/dL — ABNORMAL HIGH (ref 70–99)
Glucose-Capillary: 191 mg/dL — ABNORMAL HIGH (ref 70–99)
Glucose-Capillary: 157 mg/dL — ABNORMAL HIGH (ref 70–99)
Glucose-Capillary: 95 mg/dL (ref 70–99)

## 2022-07-31 MED ORDER — GLIMEPIRIDE 2 MG PO TABS
2.0000 mg | ORAL_TABLET | Freq: Every day | ORAL | Status: DC
  Administered 2022-08-01 – 2022-08-05 (×5): 2 mg via ORAL
  Filled 2022-07-31 (×5): qty 1

## 2022-07-31 MED ORDER — SENNA 8.6 MG PO TABS
2.0000 | ORAL_TABLET | Freq: Every day | ORAL | Status: DC
  Administered 2022-07-31 – 2022-08-05 (×6): 17.2 mg via ORAL
  Filled 2022-07-31 (×6): qty 2

## 2022-07-31 NOTE — Progress Notes (Signed)
PROGRESS NOTE   Subjective/Complaints:  Pt reports pain pretty good- taking pain meds a little more and very helpful.   LBM 2 days ago- needed sorbitol and supp to go.  Otherwise, no complaints.   ROS:    Pt denies SOB, abd pain, CP, N/V/C/D, and vision changes  Except for HPI   Objective:   No results found. No results for input(s): "WBC", "HGB", "HCT", "PLT" in the last 72 hours.  No results for input(s): "NA", "K", "CL", "CO2", "GLUCOSE", "BUN", "CREATININE", "CALCIUM" in the last 72 hours.   Intake/Output Summary (Last 24 hours) at 07/31/2022 0902 Last data filed at 07/31/2022 0720 Gross per 24 hour  Intake 921 ml  Output --  Net 921 ml        Physical Exam: Vital Signs Blood pressure (!) 107/58, pulse 81, temperature 97.7 F (36.5 C), temperature source Oral, resp. rate 18, height 5\' 4"  (1.626 m), weight 52.1 kg, last menstrual period 01/21/2012, SpO2 98 %.         General: awake, alert, appropriate, supine in bed; woke from sleep; NAD HENT: conjugate gaze; oropharynx moist - soft collar CV: regular rate; no JVD Pulmonary: CTA B/L; no W/R/R- good air movement GI: soft, NT, ND, (+)BS- slightly hypoactive Psychiatric: appropriate Neurological: Ox3  Skin: C/D/I. No apparent lesions.  Posterior neck surgical site covered in clean honeycomb dressing. Some tenting on hand MSK:      No apparent deformity.      Strength:                RUE: 4+ to 5-/5 throughout                LUE: 4/5 SA, 4-/5 EF, 4-/5 EE, 3+/5 WE, 3/5 FF, 3/5 FA                 RLE: 4 out of 5 throughout                LLE: 4 of 5 throughout   Neurologic exam:  Cognition: AAO to person, place, time and event.  Language: Fluent, No substitutions or neoglisms. No dysarthria. Names 3/3 objects correctly.  Memory: Recalls 3/3 objects at 5 minutes. No apparent deficits  Insight: Good insight into current condition.  Mood: Pleasant  affect, appropriate mood.  Sensation: To light touch intact in BL UEs and LEs  Reflexes: Hyporeflexic in bilateral upper extremities and right lower extremity; hyperreflexic at left patella. Negative Hoffman's and babinski signs bilaterally.  CN: 2-12 grossly intact.  Coordination: Fine motor coordination impaired and ataxia in left upper extremity Spasticity: MAS 0 in all extremities.    Assessment/Plan: 1. Functional deficits which require 3+ hours per day of interdisciplinary therapy in a comprehensive inpatient rehab setting. Physiatrist is providing close team supervision and 24 hour management of active medical problems listed below. Physiatrist and rehab team continue to assess barriers to discharge/monitor patient progress toward functional and medical goals  Care Tool:  Bathing    Body parts bathed by patient: Right arm, Left arm, Chest, Abdomen, Front perineal area, Right upper leg, Left upper leg, Right lower leg, Left lower leg, Face   Body parts  bathed by helper: Front perineal area, Buttocks, Right upper leg, Left upper leg, Right lower leg, Left lower leg     Bathing assist Assist Level: Minimal Assistance - Patient > 75%     Upper Body Dressing/Undressing Upper body dressing   What is the patient wearing?: Pull over shirt Orthosis activity level: Performed by helper  Upper body assist Assist Level: Set up assist    Lower Body Dressing/Undressing Lower body dressing      What is the patient wearing?: Underwear/pull up, Pants     Lower body assist Assist for lower body dressing: Minimal Assistance - Patient > 75%     Toileting Toileting Toileting Activity did not occur Press photographer and hygiene only): Refused  Toileting assist Assist for toileting: Contact Guard/Touching assist     Transfers Chair/bed transfer  Transfers assist  Chair/bed transfer activity did not occur: Safety/medical concerns (orthostatic hypotension with  sit<>stand)  Chair/bed transfer assist level: Contact Guard/Touching assist     Locomotion Ambulation   Ambulation assist   Ambulation activity did not occur: Safety/medical concerns (orthostatic hypotension with sit<>stand)  Assist level: Contact Guard/Touching assist Assistive device: Walker-rolling Max distance: 15ft   Walk 10 feet activity   Assist  Walk 10 feet activity did not occur: Safety/medical concerns  Assist level: Contact Guard/Touching assist Assistive device: Walker-rolling   Walk 50 feet activity   Assist Walk 50 feet with 2 turns activity did not occur: Safety/medical concerns  Assist level: Contact Guard/Touching assist Assistive device: Walker-rolling    Walk 150 feet activity   Assist Walk 150 feet activity did not occur: Safety/medical concerns         Walk 10 feet on uneven surface  activity   Assist Walk 10 feet on uneven surfaces activity did not occur: Safety/medical concerns         Wheelchair     Assist Is the patient using a wheelchair?: Yes Type of Wheelchair: Manual Wheelchair activity did not occur: Safety/medical concerns (orthostatic hypotension with sit<>stand)         Wheelchair 50 feet with 2 turns activity    Assist    Wheelchair 50 feet with 2 turns activity did not occur: Safety/medical concerns       Wheelchair 150 feet activity     Assist  Wheelchair 150 feet activity did not occur: Safety/medical concerns       Blood pressure (!) 107/58, pulse 81, temperature 97.7 F (36.5 C), temperature source Oral, resp. rate 18, height 5\' 4"  (1.626 m), weight 52.1 kg, last menstrual period 01/21/2012, SpO2 98 %.  Medical Problem List and Plan: 1. Functional deficits secondary to cervical myelopathy s/p C5, 6, 7, T1 decompressive laminotomy with P CDF 07/19/2022 per Dr. Dutch Quint.               - Soft cervical collar when out of bed; can be offered bathroom trips at night.             -patient may  shower with surgical site covered             -ELOS/Goals: 7 to 10 days, PT/OT mod I to supervision level  D/c 5/24  Con't CIR PT and OT 2.  Antithrombotics: -DVT/anticoagulation:  Mechanical:  Antiembolism stockings, knee (TED hose) Bilateral lower extremities.  Check vascular study 5/14- Dopplers done 5/11- look good-              -antiplatelet therapy: Aspirin 325 mg daily 3. Pain Management: Hydrocodone as needed  for pain, Valium as needed for muscle spasms  5/13- pain controlled with no meds- con't regimen 5/15- taking pain meds 2-3x/day at most and no valium lately- valium likely caused her confusion prior. 5/16- will d/c Valium- pt didn't get pain meds after 11:50 pm last night- so think she needs to take pain meds more frequently- I think that's why she hurt so much-  5/17- educated pt when gets up to 10/10, needs to take pain meds q4 hours for a few times to get back under control- she voiced understanding.   5/18- pain 0/10 this AM- with pain meds more regularly. 5/19- taking pain meds, no pain this AM  4. Mood/Behavior/Sleep: Trazodone 150 mg nightly.  Provide emotional support  5/13- refused trazodone, will make prn  5/16- will add Melatonin 5 mg QHS for poor sleep- 5/17- slept better but no pain meds overnight, so hurting this AM              -antipsychotic agents: N/A 5. Neuropsych/cognition: This patient is? capable of making decisions on her own behalf. 6. Skin/Wound Care: Routine skin checks 7. Fluids/Electrolytes/Nutrition: Routine in and outs with follow-up chemistries 8.  COPD/quit smoking 4 years ago.  Continue albuterol as directed 9.  Hyperlipidemia.  Pravachol 10.  Hx of Hypertension with orthostatic hypotension.  Continue Lisinopril 5 mg daily, Lasix 20 mg daily         --Blood pressure soft 80s over 60s on admission; appears to have been intermittently hypotensive throughout her stay.  Receiving Lasix approximately every other day.  Will DC for now, start daily  weights. 5/14-  will d/w therapy if affecting therapy- pt c/o light headedness when sits up- wearing TEDs- will stop Lisinopril and explained to pt to drink 6-8 cups/day.  5/15- will add Midodrine 8am and noon daily 5 mg- since dropped into 80s systolic with standing again this AM- of note, weight down to 51.5 kg. 5/16- Wgt 53.5 kg- which is more towards her baseline- also appears to have less drop in BP yesterday with midodrine- down to 110s vs 70s. Con't regimen  5/17- BP running supine 130s-150s- however drops when sits up- not dry on BMP yesterday  5/18- 125 to 160s- systolic- still has orthostatic hypotension, so will not be more aggressive with BP.  11.  Diabetes mellitus.  Latest hemoglobin A1c 9.3.  Currently SSI, continue  5.13- restarted Amaryl- wasn't taking at home.   514- Bgs looking better- 141 to 188 since started Amaryl- much better  5/15- CBGs 100-175- doing much better- con't regimen  5/17- CBGs dropped to 60s 2 separate times yesterday- will decrease SSI to sensitive from moderate- to be less aggressive.   5/18- CBGs 1-1=198= due to decreased SSI- will keep an eye on her CBGs and titrate as required/monitor trend. 5/19- CBGs 159-230- will try increasing Amaryl slightly to 2 mg daily- had drops in Cbgs with mod SSI, but hopefully will not as much with increase in Amaryl  12.  Hypothyroidism. Continue Synthroid 13.  Constipation. Continue Colace 100 mg daily, Dulcolax suppository daily as needed, MiraLAX daily as needed  5.13- sorbitol 45cc- if no results, wrote for Soap suds enema.   5/14- very large BM last night after sorbitol- didn't need enema  5/16- LBM 3 days ago- will add Miralax as well as Sorbitol 30cc x1  5/17- will give 45 cc Sorbitol and if no BM, give soap suds enema  5/18- had multiple BM's- had suppository- refused enema. 5/19- will add Senna  since LBM 2 days ago- and taking pain meds, so miralax probably not enough- 2 tabs daily   14. Low grade fever  5/14-  T-100 degrees this AM- felt dizzy, but sounds like not fevrish Sx's- got tylenol- feels better- U/A and CXR yesterday negative- could be atelectasis- so added flutter valve. Dopplers also negative- asked pt to drink 6-8 cups/day of water  5/15- T 99.7 this AM, but not feeling badly. Will check labs again in AM  5/16- no elevated temp  5/18- no more low grade temps/resolved  I spent a total of 35   minutes on total care today- >50% coordination of care- due to titration of CBGs; and review of labs,Cbgs, and vitals as well as BP's- still having orthostatic hypotension.     LOS: 9 days A FACE TO FACE EVALUATION WAS PERFORMED  Alexandria Webster 07/31/2022, 9:02 AM

## 2022-07-31 NOTE — Progress Notes (Signed)
Occupational Therapy Session Note  Patient Details  Name: Alexandria Webster MRN: [id number] Date of Birth: 01/10/1966  Today's Date: 07/31/2022 OT Individual Time: 4098-1191 OT Individual Time Calculation (min): 72 min    Skilled Therapeutic Interventions/Progress Updates: Patient received resting in bed. Reported hip pain, but agreeable to treatment activities in the room. Patient seen for fine motor coordination and strengthening using soft putty. Worked on gross grasp to warm up the putty. Followed with pinches with thumb and each finger several times with BUE's. Finger extension in putty flattened into a disc shape. Thumb adduction "pushing button"; all putty exercises tolerated well. Followed with simple endurance exercises using yellow therapy bands.  Patient continues to display general weakness in BUE's but is able to perform bed mobility without physical assist to get up to the sink for grooming. Able to walk to the sink with the walker close SBA. Grooming set up assist standing at the sink. Returned to EOB and able to return from siting to supine without physical assist. Continue with skilled OT POC to assist in gaining strength and independence with self care.     Therapy Documentation Precautions:  Precautions Precautions: Fall, Cervical Required Braces or Orthoses: Cervical Brace Cervical Brace: Soft collar Other Brace: can be off in bed, can be off for bathroom trips at night Restrictions Weight Bearing Restrictions: No  Pain: Pain Assessment Pain Score: 2  Vision-Patient with Low vision. Benefits from high contrast and needs vc's to negotiate obstacles in the hospital.   Exercises: Patient given theraputty exercises with beige putty for FM strength.   Other Treatments:     Therapy/Group: Individual Therapy  Warnell Forester 07/31/2022, 12:02 PM

## 2022-08-01 DIAGNOSIS — M542 Cervicalgia: Secondary | ICD-10-CM

## 2022-08-01 DIAGNOSIS — K30 Functional dyspepsia: Secondary | ICD-10-CM

## 2022-08-01 DIAGNOSIS — G3184 Mild cognitive impairment, so stated: Secondary | ICD-10-CM

## 2022-08-01 LAB — GLUCOSE, CAPILLARY
Glucose-Capillary: 122 mg/dL — ABNORMAL HIGH (ref 70–99)
Glucose-Capillary: 138 mg/dL — ABNORMAL HIGH (ref 70–99)
Glucose-Capillary: 117 mg/dL — ABNORMAL HIGH (ref 70–99)
Glucose-Capillary: 176 mg/dL — ABNORMAL HIGH (ref 70–99)

## 2022-08-01 MED ORDER — BISMUTH SUBSALICYLATE 262 MG/15ML PO SUSP: 30.0000 mL | Freq: Two times a day (BID) | ORAL | Status: DC | PRN

## 2022-08-01 MED ORDER — FAMOTIDINE 20 MG PO TABS: 20.0000 mg | ORAL_TABLET | Freq: Every day | ORAL | Status: DC | PRN

## 2022-08-01 NOTE — Progress Notes (Signed)
Occupational Therapy Session Note  Patient Details  Name: Alexandria Webster MRN: [id number] Date of Birth: Nov 04, 1965  Today's Date: 08/01/2022 OT Individual Time: 0700-0730 OT Individual Time Calculation (min): 30 min  and Today's Date: 08/01/2022 OT Missed Time: 45 Minutes Missed Time Reason: Pain;Other (comment) (dizziness)   Short Term Goals: Week 1:  OT Short Term Goal 1 (Week 1): Pt will complete sit > stand in prep for ADL with Min A using LRAD OT Short Term Goal 1 - Progress (Week 1): Met OT Short Term Goal 2 (Week 1): Pt will complete toilet transfer with min A using LRAD to promote OOB toileting OT Short Term Goal 2 - Progress (Week 1): Met OT Short Term Goal 3 (Week 1): Pt will complete thread BLE into LB clothing using AE PRN with CGA for sitting balance OT Short Term Goal 3 - Progress (Week 1): Met OT Short Term Goal 4 (Week 1): Pt will demonstrate improved recall of cervical precautions for 2 consecutive session with supervision A OT Short Term Goal 4 - Progress (Week 1): Met  Skilled Therapeutic Interventions/Progress Updates:    Pt resting in bed upon arrival. Pt reports she had a "rough" night and needed her pain meds and dizziness meds before she can do anything. Pt requested to use bathroom. Supine>sit EOB with supervision. Sit<>stand and amb with RW to bathroom with CGA. Toileitng with supervision. Pt returned to room and stood at sink to wash hands. Pt returned to EOB and reiterated that she "couldn't do anything" until she had her meds. Sit>supine with supervisoin. Bed alarm activated. All needs within reach. Tomorrow's scheduled modified to later in AM to allow opportunity for meds to be administered.  Therapy Documentation Precautions:  Precautions Precautions: Fall, Cervical Required Braces or Orthoses: Cervical Brace Cervical Brace: Soft collar Other Brace: can be off in bed, can be off for bathroom trips at night Restrictions Weight Bearing Restrictions:  No General: General OT Amount of Missed Time: 45 Minutes Pain:  Pt reports her pain is "really bad" and unable to participate in therapy until she has her pain meds; nursing notified, repositioned  Therapy/Group: Individual Therapy  Rich Brave 08/01/2022, 8:06 AM

## 2022-08-01 NOTE — Progress Notes (Signed)
PROGRESS NOTE   Subjective/Complaints:  Pt reports she had some GI/stomach discomfort yesterday, this has resolved today.   LBM 5/19 large  ROS:    Pt denies fever, SOB, abd pain, CP, N/V/C/D, and vision changes  Except for HPI   Objective:   No results found. No results for input(s): "WBC", "HGB", "HCT", "PLT" in the last 72 hours.  No results for input(s): "NA", "K", "CL", "CO2", "GLUCOSE", "BUN", "CREATININE", "CALCIUM" in the last 72 hours.   Intake/Output Summary (Last 24 hours) at 08/01/2022 1653 Last data filed at 08/01/2022 1318 Gross per 24 hour  Intake 647 ml  Output --  Net 647 ml         Physical Exam: Vital Signs Blood pressure (!) 103/55, pulse (!) 101, temperature 98.4 F (36.9 C), temperature source Oral, resp. rate 16, height 5\' 4"  (1.626 m), weight 54.7 kg, last menstrual period 01/21/2012, SpO2 100 %.         General: awake, alert, appropriate, lying in bed,  NAD HENT: conjugate gaze; oropharynx moist - soft collar CV: regular rate; no JVD Pulmonary: CTA B/L; no W/R/R- good air movement GI: soft, NT, ND, (+)BS- slightly hypoactive Psychiatric: appropriate,  pleasant Neurological: Ox3, follows commands  Skin: C/D/I. No apparent lesions.  Posterior neck surgical site covered in clean honeycomb dressing. Some tenting on hand MSK:      No apparent deformity.      Strength:                RUE: 4+ to 5-/5 throughout                LUE: 4/5 SA, 4-/5 EF, 4-/5 EE, 3+/5 WE, 3/5 FF, 3/5 FA                 RLE: 4 out of 5 throughout                LLE: 4 of 5 throughout   Neurologic exam:  Cognition: AAO to person, place, time and event.  Language: Fluent, No substitutions or neoglisms. No dysarthria. Names 3/3 objects correctly.  Memory: Recalls 3/3 objects at 5 minutes. No apparent deficits  Insight: Good insight into current condition.  Mood: Pleasant affect, appropriate mood.   Sensation: To light touch intact in BL UEs and LEs  Reflexes: Hyporeflexic in bilateral upper extremities and right lower extremity; hyperreflexic at left patella. Negative Hoffman's and babinski signs bilaterally.  CN: 2-12 grossly intact.  Coordination: Fine motor coordination impaired and ataxia in left upper extremity Spasticity: MAS 0 in all extremities.    Assessment/Plan: 1. Functional deficits which require 3+ hours per day of interdisciplinary therapy in a comprehensive inpatient rehab setting. Physiatrist is providing close team supervision and 24 hour management of active medical problems listed below. Physiatrist and rehab team continue to assess barriers to discharge/monitor patient progress toward functional and medical goals  Care Tool:  Bathing    Body parts bathed by patient: Right arm, Left arm, Chest, Abdomen, Front perineal area, Right upper leg, Left upper leg, Right lower leg, Left lower leg, Face   Body parts bathed by helper: Front perineal area, Buttocks, Right upper  leg, Left upper leg, Right lower leg, Left lower leg     Bathing assist Assist Level: Minimal Assistance - Patient > 75%     Upper Body Dressing/Undressing Upper body dressing   What is the patient wearing?: Pull over shirt Orthosis activity level: Performed by helper  Upper body assist Assist Level: Set up assist    Lower Body Dressing/Undressing Lower body dressing      What is the patient wearing?: Underwear/pull up, Pants     Lower body assist Assist for lower body dressing: Supervision/Verbal cueing     Toileting Toileting Toileting Activity did not occur (Clothing management and hygiene only): Refused  Toileting assist Assist for toileting: Supervision/Verbal cueing     Transfers Chair/bed transfer  Transfers assist  Chair/bed transfer activity did not occur: Safety/medical concerns (orthostatic hypotension with sit<>stand)  Chair/bed transfer assist level: Contact  Guard/Touching assist     Locomotion Ambulation   Ambulation assist   Ambulation activity did not occur: Safety/medical concerns (orthostatic hypotension with sit<>stand)  Assist level: Contact Guard/Touching assist Assistive device: Walker-rolling Max distance: 169ft   Walk 10 feet activity   Assist  Walk 10 feet activity did not occur: Safety/medical concerns  Assist level: Contact Guard/Touching assist Assistive device: Walker-rolling   Walk 50 feet activity   Assist Walk 50 feet with 2 turns activity did not occur: Safety/medical concerns  Assist level: Contact Guard/Touching assist Assistive device: Walker-rolling    Walk 150 feet activity   Assist Walk 150 feet activity did not occur: Safety/medical concerns         Walk 10 feet on uneven surface  activity   Assist Walk 10 feet on uneven surfaces activity did not occur: Safety/medical concerns         Wheelchair     Assist Is the patient using a wheelchair?: Yes Type of Wheelchair: Manual Wheelchair activity did not occur: Safety/medical concerns (orthostatic hypotension with sit<>stand)         Wheelchair 50 feet with 2 turns activity    Assist    Wheelchair 50 feet with 2 turns activity did not occur: Safety/medical concerns       Wheelchair 150 feet activity     Assist  Wheelchair 150 feet activity did not occur: Safety/medical concerns       Blood pressure (!) 103/55, pulse (!) 101, temperature 98.4 F (36.9 C), temperature source Oral, resp. rate 16, height 5\' 4"  (1.626 m), weight 54.7 kg, last menstrual period 01/21/2012, SpO2 100 %.  Medical Problem List and Plan: 1. Functional deficits secondary to cervical myelopathy s/p C5, 6, 7, T1 decompressive laminotomy with P CDF 07/19/2022 per Dr. Dutch Quint.               - Soft cervical collar when out of bed; can be offered bathroom trips at night.             -patient may shower with surgical site covered              -ELOS/Goals: 7 to 10 days, PT/OT mod I to supervision level  D/c 5/24  Con't CIR PT and OT  -Team conference tomorrow 2.  Antithrombotics: -DVT/anticoagulation:  Mechanical:  Antiembolism stockings, knee (TED hose) Bilateral lower extremities.  Check vascular study 5/14- Dopplers done 5/11- look good-              -antiplatelet therapy: Aspirin 325 mg daily 3. Pain Management: Hydrocodone as needed for pain, Valium as needed for muscle spasms  5/13- pain controlled with no meds- con't regimen 5/15- taking pain meds 2-3x/day at most and no valium lately- valium likely caused her confusion prior. 5/16- will d/c Valium- pt didn't get pain meds after 11:50 pm last night- so think she needs to take pain meds more frequently- I think that's why she hurt so much-  5/17- educated pt when gets up to 10/10, needs to take pain meds q4 hours for a few times to get back under control- she voiced understanding.   5/18- pain 0/10 this AM- with pain meds more regularly. 5/19- taking pain meds, no pain this AM  5/20 pain controlled with use of norco today 4. Mood/Behavior/Sleep: Trazodone 150 mg nightly.  Provide emotional support  5/13- refused trazodone, will make prn  5/16- will add Melatonin 5 mg QHS for poor sleep- 5/17- slept better but no pain meds overnight, so hurting this AM              -antipsychotic agents: N/A 5. Neuropsych/cognition: This patient is? capable of making decisions on her own behalf. 6. Skin/Wound Care: Routine skin checks 7. Fluids/Electrolytes/Nutrition: Routine in and outs with follow-up chemistries 8.  COPD/quit smoking 4 years ago.  Continue albuterol as directed 9.  Hyperlipidemia.  Pravachol 10.  Hx of Hypertension with orthostatic hypotension.  Continue Lisinopril 5 mg daily, Lasix 20 mg daily         --Blood pressure soft 80s over 60s on admission; appears to have been intermittently hypotensive throughout her stay.  Receiving Lasix approximately every other day.  Will  DC for now, start daily weights. 5/14-  will d/w therapy if affecting therapy- pt c/o light headedness when sits up- wearing TEDs- will stop Lisinopril and explained to pt to drink 6-8 cups/day.  5/15- will add Midodrine 8am and noon daily 5 mg- since dropped into 80s systolic with standing again this AM- of note, weight down to 51.5 kg. 5/16- Wgt 53.5 kg- which is more towards her baseline- also appears to have less drop in BP yesterday with midodrine- down to 110s vs 70s. Con't regimen  5/17- BP running supine 130s-150s- however drops when sits up- not dry on BMP yesterday  5/18- 125 to 160s- systolic- still has orthostatic hypotension, so will not be more aggressive with BP.  5/20 BP continues to be labile, continue to monitor    08/01/2022    1:15 PM 08/01/2022    4:57 AM 07/31/2022    9:41 PM  Vitals with BMI  Weight  120 lbs 9 oz   BMI  20.69   Systolic 103 113 161  Diastolic 55 54 72  Pulse 101 79 86    11.  Diabetes mellitus.  Latest hemoglobin A1c 9.3.  Currently SSI, continue  5.13- restarted Amaryl- wasn't taking at home.   514- Bgs looking better- 141 to 188 since started Amaryl- much better  5/15- CBGs 100-175- doing much better- con't regimen  5/17- CBGs dropped to 60s 2 separate times yesterday- will decrease SSI to sensitive from moderate- to be less aggressive.   5/18- CBGs 1-1=198= due to decreased SSI- will keep an eye on her CBGs and titrate as required/monitor trend. 5/19- CBGs 159-230- will try increasing Amaryl slightly to 2 mg daily- had drops in Cbgs with mod SSI, but hopefully will not as much with increase in Amaryl  5/20 well controlled today, continue regimen  CBG (last 3)  Recent Labs    08/01/22 0746 08/01/22 1137 08/01/22 1646  GLUCAP 122* 138*  117*    12.  Hypothyroidism. Continue Synthroid 13.  Constipation. Continue Colace 100 mg daily, Dulcolax suppository daily as needed, MiraLAX daily as needed  5.13- sorbitol 45cc- if no results, wrote for  Soap suds enema.   5/14- very large BM last night after sorbitol- didn't need enema  5/16- LBM 3 days ago- will add Miralax as well as Sorbitol 30cc x1  5/17- will give 45 cc Sorbitol and if no BM, give soap suds enema  5/18- had multiple BM's- had suppository- refused enema. 5/19- will add Senna since LBM 2 days ago- and taking pain meds, so miralax probably not enough- 2 tabs daily  5/20 LBM yesterday, improved  14. Low grade fever  5/14- T-100 degrees this AM- felt dizzy, but sounds like not fevrish Sx's- got tylenol- feels better- U/A and CXR yesterday negative- could be atelectasis- so added flutter valve. Dopplers also negative- asked pt to drink 6-8 cups/day of water  5/15- T 99.7 this AM, but not feeling badly. Will check labs again in AM  5/16- no elevated temp  5/18- no more low grade temps/resolved 15. Indigestion, improved  -Pepcid PRN ordered     LOS: 10 days A FACE TO FACE EVALUATION WAS PERFORMED  Fanny Dance 08/01/2022, 4:53 PM

## 2022-08-01 NOTE — Consult Note (Signed)
Neuropsychological Consultation Comprehensive Inpatient Rehab   Patient:   Alexandria Webster   DOB:   [birth year]  MR Number:  [id number]  Location:  [facility] [address] CENTER [id number] [place] [address] Dept: [phone] Loc: [phone]           Date of Service:   08/01/2022  Start Time:   11 AM End Time:   12 PM  Provider/Observer:  Arley Phenix, Psy.D.       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  Patient is a right-handed female with history of diabetes, hypertension, hyperlipidemia, COPD and quit smoking 4 years ago.  Patient was already needing assistance for completing ADLs.  Patient presented on 07/19/2022 with bilateral upper extremity numbness and paresthesia as well as weakness with increasing gait instability and frequent falls over the past year.  Patient with previous MRIs and most recent MRI was generalized age-related cerebral atrophy and hyperintensities involving deep white matter of both cerebral hemispheres consistent with chronic small vessel ischemic disease.  There were also a few remote lacunar infarcts present in the thalamus and a few small remote bilateral cerebellar infarcts noted as well.  Patient acknowledged changes in motor function including more falls and needing assistance some of which could be related to these cerebrovascular changes.  Patient noted changes more recently over right versus left-sided weakness.  CT/MRI of brain showed no acute intercranial abnormality but MRI of cervical spine showed multilevel cervical spondylosis with resultant diffuse spinal stenosis, severe in nature at C5-6 and C6/7.  Patchy cord signal  change at these levels consistent with myelomalacia.  Patient underwent C5-6-C7, T1 decompressive laminectomy with C4-5-6-C7 and T1 posterior lateral arthrodesis on 07/19/2022.  Patient placed in cervical soft collar.  Once therapy evaluations were completed the patient was admitted to the comprehensive inpatient rehabilitation program.  Upon entering the room, the patient was laying on her side in her bed with her head elevated by pillow.  Patient was in a soft collar and rolled over to her back.  She was oriented x 4 but admitted to having changes both physically as well as cognitively over the past year or more.  Patient acknowledged difficulties with retrieving information but just attributed to getting older given that she is a 57 year old female.  Patient has had difficulties with lumbar region in the past but did not immediately associate the changes she was experiencing to cervical/spinal issues.  Patient was unaware of any past stroke events but did acknowledge some changes she has been experiencing over the past year or more.  Patient reported that mood was stable without significant depression or anxiety type symptoms.  HPI for the current admission:    HPI: Alexandria Webster is a 57 year old right-handed female with history of diabetes mellitus, hypertension, hyperlipidemia, COPD who quit smoking 4 years ago. Per chart review patient lives with husband and daughter. 1 level home 5 steps to entry. Daughter was assisting with ADLs. Presented 07/19/2022 with bilateral upper extremity numbness and paresthesias as well as weakness with increasing gait instability and frequent falls over the past year. Initially patient stated her right side was weaker however more recently her left side has become more affected. CT/MRI of the  brain showed no acute intracranial abnormality there was a small remote lacunar infarct about the thalami and cerebellum. MRI of the cervical spine showed multilevel cervical  spondylosis with resultant diffuse spinal stenosis, severe in nature at C5-6 and C6-7. Patchy cord signal changes at these levels consistent with myelomalacia. Question additional subtle cord signal abnormality at the level of C3-4. Patient underwent C5-6-C7, T1 decompressive laminotomy with C4-5-6-C7 and T1 posterior lateral arthrodesis 07/19/2022 per Dr. Jordan Likes. Placed in a cervical soft collar that can be off when in bed. Blood pressures have been soft and monitored when up with therapies. Therapy evaluations completed due to patient's decreased functional mobility was admitted for a comprehensive rehab program.   Medical History:   Past Medical History:  Diagnosis Date   Asthma    COPD (chronic obstructive pulmonary disease) (HCC)    DDD (degenerative disc disease), lumbar    Depression    Diabetes mellitus    Gastroparesis    GERD without esophagitis    High cholesterol    Hypertension    Hypothyroidism    PONV (postoperative nausea and vomiting)    PVD (peripheral vascular disease) (HCC)    Wears glasses          Patient Active Problem List   Diagnosis Date Noted   Mild cognitive impairment 08/01/2022   Cervical myelopathy (HCC) 07/22/2022   Stenosis of cervical spine with myelopathy (HCC) 07/19/2022   Degeneration of cervical intervertebral disc 08/08/2021   Syncope and collapse 08/06/2021   Hypomagnesemia 08/06/2021   Hypothyroidism 08/06/2021   Allergic rhinitis 08/06/2021   AKI (acute kidney injury) (HCC) 08/06/2021   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/27/2018   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   Gastritis 10/01/2015   Gastroparesis due to DM (HCC) 10/01/2015   Personal history of noncompliance with medical treatment, presenting hazards to health 09/14/2015   Hypokalemia 11/13/2012   Constipation 11/09/2012   Facial cellulitis 11/07/2012   Leukocytosis 11/07/2012   Type 2 diabetes mellitus  (HCC) 11/07/2012   Asthma 11/07/2012   PVD (peripheral vascular disease) (HCC) 11/07/2012   Hyponatremia 11/07/2012   Tachycardia 11/07/2012   Nausea & vomiting 11/07/2012   Essential hypertension, benign     Behavioral Observation/Mental Status:   Alexandria Webster  presents as a 57 y.o.-year-old Right handed Caucasian Female who appeared her stated age. her dress was Appropriate and she was Well Groomed and her manners were Appropriate to the situation.  her participation was indicative of Appropriate and Redirectable behaviors.  There were physical disabilities noted.  she displayed an appropriate level of cooperation and motivation.    Interactions:    Active Appropriate  Attention:   within normal limits and attention span and concentration were age appropriate  Memory:   within normal limits; recent and remote memory intact  Visuo-spatial:   not examined  Speech (Volume):  normal  Speech:   normal; normal  Thought Process:  Coherent and Relevant  Coherent and Directed  Though Content:  WNL; not suicidal and not homicidal  Orientation:   person, place, time/date, and situation  Judgment:   Fair  Planning:   Fair  Affect:    Appropriate  Mood:    Euthymic  Insight:   Fair  Intelligence:   low  Family Med/Psych History:  Family History  Problem Relation Age of Onset   Stroke Mother    Hypertension Mother    Heart failure Father  Asthma Father    Diabetes Father    Heart disease Father    Emphysema Paternal Grandfather    Colon cancer Neg Hx    Colon polyps Neg Hx     Impression/DX:   Alexandria Webster is a 57 year old female referred for neuropsychological consultation due to coping and adjustment issues as well as ongoing cognitive 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  Patient is a right-handed female with history of diabetes, hypertension, hyperlipidemia, COPD and quit smoking 4 years ago.  Patient was already needing assistance for  completing ADLs.  Patient presented on 07/19/2022 with bilateral upper extremity numbness and paresthesia as well as weakness with increasing gait instability and frequent falls over the past year.  Patient with previous MRIs and most recent MRI was generalized age-related cerebral atrophy and hyperintensities involving deep white matter of both cerebral hemispheres consistent with chronic small vessel ischemic disease.  There were also a few remote lacunar infarcts present in the thalamus and a few small remote bilateral cerebellar infarcts noted as well.  Patient acknowledged changes in motor function including more falls and needing assistance some of which could be related to these cerebrovascular changes.  Patient noted changes more recently over right versus left-sided weakness.  CT/MRI of brain showed no acute intercranial abnormality but MRI of cervical spine showed multilevel cervical spondylosis with resultant diffuse spinal stenosis, severe in nature at C5-6 and C6/7.  Patchy cord signal change at these levels consistent with myelomalacia.  Patient underwent C5-6-C7, T1 decompressive laminectomy with C4-5-6-C7 and T1 posterior lateral arthrodesis on 07/19/2022.  Patient placed in cervical soft collar.  Once therapy evaluations were completed the patient was admitted to the comprehensive inpatient rehabilitation program.  Upon entering the room, the patient was laying on her side in her bed with her head elevated by pillow.  Patient was in a soft collar and rolled over to her back.  She was oriented x 4 but admitted to having changes both physically as well as cognitively over the past year or more.  Patient acknowledged difficulties with retrieving information but just attributed to getting older given that she is a 57 year old female.  Patient has had difficulties with lumbar region in the past but did not immediately associate the changes she was experiencing to cervical/spinal issues.  Patient was  unaware of any past stroke events but did acknowledge some changes she has been experiencing over the past year or more.  Patient reported that mood was stable without significant depression or anxiety type symptoms.  Disposition/Plan:  Today a good bit of her time was spent explaining the overall nature of her difficulties describing how some of her symptoms have been developing related to cerebrovascular changes and the need for managing her diabetes and chronic kidney disease.  The patient reports that her pain has been better postsurgery and she is looking forward to return home.  Patient reports that she is motivated for recovery but much of what is going on medically is difficult for her to fully grasp or understand.  Discussions about cerebrovascular issues and what needs to be done to try to limit its progression were reviewed with patient appearing to understand.          Electronically Signed   _______________________ Arley Phenix, Psy.D. Clinical Neuropsychologist

## 2022-08-01 NOTE — Progress Notes (Signed)
Physical Therapy Session Note  Patient Details  Name: Alexandria Webster MRN: [id number] Date of Birth: 1965-11-15  Today's Date: 08/01/2022 PT Individual Time: 0900-0954 PT Individual Time Calculation (min): 54 min  and Today's Date: 08/01/2022 PT Missed Time: 21 Minutes Missed Time Reason: Patient ill (Comment) (dizziness)  Short Term Goals: Week 2:  PT Short Term Goal 1 (Week 2): =LTGs d/t ELOS  Skilled Therapeutic Interventions/Progress Updates:    pt received in bed and agreeable to therapy. Pt reports pain "not too bad" at rest, premedicated. Rest and positioning provided as needed. Bed mobility with supervision and HOB elevated. Pt sat for several minutes while brushing hair to avoid orthostasis, but had episode of dizziness and increased pain after gait ~60 ft despite preventative measures. BP found to be 109/61 (76) HR=92 and pt reporting she was feeling some better by the time BP could be taken. Pt transported to gym and performed supervision Stand pivot transfer to mat table with RW. Sit>supine with supervision. Therapist provided trigger point release in sub occipitals, upper traps, and upper cervical paraspinal musculature, with pt reporting relief from increased pain.  Did note large increase in dizzy symptoms when rolling R sidelying>supine. No nystagmus visible. Pt did require assist to sit to edge of mat after MT, requiring min HHA. Pt then agreeable to upright therapy. Pt navigated 6" steps with CGA fading to supervision, 2 x 4, with B handrails. Extended rest breaks spent discussing pt's poor night's sleep. After second set, pt reports increased queasy feeling and requests to return to room. Pt transported back and transitioned to supine with supervision. Pt was left with all needs in reach and alarm active.  Missed 21 min d/t feeling ill/queasy.   Therapy Documentation Precautions:  Precautions Precautions: Fall, Cervical Required Braces or Orthoses: Cervical Brace Cervical  Brace: Soft collar Other Brace: can be off in bed, can be off for bathroom trips at night Restrictions Weight Bearing Restrictions: No General: PT Amount of Missed Time (min): 21 Minutes PT Missed Treatment Reason: Patient ill (Comment) (dizziness)     Therapy/Group: Individual Therapy  Juluis Rainier 08/01/2022, 9:56 AM

## 2022-08-01 NOTE — Progress Notes (Signed)
Occupational Therapy Session Note  Patient Details  Name: Alexandria Webster MRN: [id number] Date of Birth: 1965/04/05  Today's Date: 08/01/2022 OT Individual Time: 1300-1355 OT Individual Time Calculation (min): 55 min    Short Term Goals: Week 2:  OT Short Term Goal 1 (Week 2): Patient to progress towards LTG's due to LOS.  Skilled Therapeutic Interventions/Progress Updates:    Pt resting in bed upon arrival. Pt requested declined bathing but wanted to change pants. OTA assisted with donning Ted hose. Pt donned pants and socks with sit<>stand from EOB with supervision. OT intervention with focus on block practice walk-in shower tranfsers, functional amb with RW, furniture transfers, and discharge planning. All amb with RW at supervision level. Shower transfers with CGA. Pt amb in ADL apartment to practice furniture transfers. Amb in Day room 123'x2 with rest breakx1. Pt returned to room and requested to return to bed. All needs within reach. Bed alarm activated.   Therapy Documentation Precautions:  Precautions Precautions: Fall, Cervical Required Braces or Orthoses: Cervical Brace Cervical Brace: Soft collar Other Brace: can be off in bed, can be off for bathroom trips at night Restrictions Weight Bearing Restrictions: No Pain: Pt reports NO pain this afternoon. Pt reports that she "made sure to take medicine" so she could "do her therapy."   Therapy/Group: Individual Therapy  Rich Brave 08/01/2022, 1:59 PM

## 2022-08-01 NOTE — Discharge Summary (Signed)
Physician Discharge Summary  Patient ID: Alexandria Webster MRN: [id number] DOB/AGE: [birth year] 57 y.o.  Admit date: 07/22/2022 Discharge date: 08/05/2022  Discharge Diagnoses:  Principal Problem:   Cervical myelopathy (HCC) Active Problems:   Mild cognitive impairment COPD Hyperlipidemia Orthostasis Diabetes mellitus Hypothyroidism Constipation  Discharged Condition: Stable  Significant Diagnostic Studies: DG CHEST PORT 1 VIEW  Result Date: 07/25/2022 CLINICAL DATA:  Delirium. EXAM: PORTABLE CHEST 1 VIEW COMPARISON:  X-ray 10/12/2018 and older FINDINGS: No consolidation, pneumothorax or effusion. Slight linear opacity of the left lung base likely scar or atelectasis. Normal cardiopericardial silhouette. Calcified aorta. Degenerative changes of the spine. Osteopenia. Fixation hardware seen along the cervical spine at the edge of the imaging field. IMPRESSION: Minimal left basilar scar or atelectasis. No consolidation or effusion. Electronically Signed   By: Karen Kays M.D.   On: 07/25/2022 15:40   VAS [place] LOWER EXTREMITY VENOUS (DVT)  Result Date: 07/24/2022  Lower Venous DVT Study Patient Name:  Alexandria Webster  Date of Exam:   07/23/2022 Medical Rec #: [id number]         Accession #:    [id number] Date of Birth: [birth year]         Patient Gender: F Patient Age:   1 years Exam Location:  [facility] [place] Procedure:      VAS [place] LOWER EXTREMITY VENOUS (DVT) Referring Phys: Mariam Dollar --------------------------------------------------------------------------------  Indications: Swelling.  Risk Factors: None identified. Limitations: Poor ultrasound/tissue interface. Comparison Study: No prior studies. Performing Technologist: Chanda Busing RVT  Examination Guidelines: A complete evaluation includes B-mode imaging, spectral Doppler, color Doppler, and power Doppler as needed of all accessible portions of each vessel. Bilateral testing is considered an integral part of a complete  examination. Limited examinations for reoccurring indications may be performed as noted. The reflux portion of the exam is performed with the patient in reverse Trendelenburg.  +---------+---------------+---------+-----------+----------+--------------+ RIGHT    CompressibilityPhasicitySpontaneityPropertiesThrombus Aging +---------+---------------+---------+-----------+----------+--------------+ CFV      Full           Yes      Yes                                 +---------+---------------+---------+-----------+----------+--------------+ SFJ      Full                                                        +---------+---------------+---------+-----------+----------+--------------+ FV Prox  Full                                                        +---------+---------------+---------+-----------+----------+--------------+ FV Mid   Full                                                        +---------+---------------+---------+-----------+----------+--------------+ FV DistalFull                                                        +---------+---------------+---------+-----------+----------+--------------+  PFV      Full                                                        +---------+---------------+---------+-----------+----------+--------------+ POP      Full           Yes      Yes                                 +---------+---------------+---------+-----------+----------+--------------+ PTV      Full                                                        +---------+---------------+---------+-----------+----------+--------------+ PERO     Full                                                        +---------+---------------+---------+-----------+----------+--------------+   +---------+---------------+---------+-----------+----------+--------------+ LEFT     CompressibilityPhasicitySpontaneityPropertiesThrombus Aging  +---------+---------------+---------+-----------+----------+--------------+ CFV      Full           Yes      Yes                                 +---------+---------------+---------+-----------+----------+--------------+ SFJ      Full                                                        +---------+---------------+---------+-----------+----------+--------------+ FV Prox  Full                                                        +---------+---------------+---------+-----------+----------+--------------+ FV Mid   Full                                                        +---------+---------------+---------+-----------+----------+--------------+ FV DistalFull                                                        +---------+---------------+---------+-----------+----------+--------------+ PFV      Full                                                        +---------+---------------+---------+-----------+----------+--------------+  POP      Full           Yes      Yes                                 +---------+---------------+---------+-----------+----------+--------------+ PTV      Full                                                        +---------+---------------+---------+-----------+----------+--------------+ PERO     Full                                                        +---------+---------------+---------+-----------+----------+--------------+     Summary: RIGHT: - There is no evidence of deep vein thrombosis in the lower extremity.  - No cystic structure found in the popliteal fossa.  LEFT: - There is no evidence of deep vein thrombosis in the lower extremity.  - No cystic structure found in the popliteal fossa.  *See table(s) above for measurements and observations. Electronically signed by Lemar Livings MD on 07/24/2022 at 10:26:52 AM.    Final    DG Cervical Spine 2 or 3 views  Result Date: 07/19/2022 CLINICAL DATA:  Elective surgery.  EXAM: CERVICAL SPINE - 2-3 VIEW COMPARISON:  None Available. FINDINGS: Three fluoroscopic spot views of the cervical spine obtained in the operating room in frontal and lateral projections. Rod and pedicle screw fixation from C3 through C7. Fluoroscopy time 21 seconds. Dose 3.02 mGy. IMPRESSION: Intraoperative fluoroscopy during cervical fusion. Electronically Signed   By: Narda Rutherford M.D.   On: 07/19/2022 10:46   DG C-Arm 1-60 Min-No Report  Result Date: 07/19/2022 Fluoroscopy was utilized by the requesting physician.  No radiographic interpretation.    Labs:  Basic Metabolic Panel: No results for input(s): "NA", "K", "CL", "CO2", "GLUCOSE", "BUN", "CREATININE", "CALCIUM", "MG", "PHOS" in the last 168 hours.   CBC: No results for input(s): "WBC", "NEUTROABS", "HGB", "HCT", "MCV", "PLT" in the last 168 hours.   CBG: Recent Labs  Lab 08/03/22 2037 08/04/22 0613 08/04/22 1220 08/04/22 1634 08/04/22 2223  GLUCAP 153* 125* 116* 175* 136*   Family history.  Mother with CVA and hypertension.  Father with heart failure and asthma as well as diabetes.  Denies any colon cancer esophageal cancer or rectal cancer  Brief HPI:   Alexandria Webster is a 57 y.o. right-handed female with history of diabetes mellitus, hypertension, hyperlipidemia, COPD quit smoking 4 years ago.  Per chart review lives with husband and daughter.  Daughter was assisting with ADLs.  Presented 07/19/2022 with bilateral upper extremity numbness and paresthesias as well as weakness with increasing gait instability and frequent falls over the past year.  Initially patient stated her right side was weaker however more recently her left side has become more affected.  CT/MRI of the brain showed no acute intracranial abnormality there was a small remote lacunar infarct about the thalami and cerebellum.  MRI of the cervical spine showed multilevel cervical spondylosis with resultant diffuse spinal stenosis, severe in nature at C5-6  and C6-7.  Patchy cord  signal changes at these levels consistent with myelomalacia.  Question additional subtle cord signal abnormality at the level C3-4.  Patient underwent C5-6-7, T1 decompressive laminotomy with posterior lateral arthrodesis 07/19/2022 per Dr. Jordan Likes.  Placed in a cervical collar that can be off when in bed.  Blood pressures soft and monitored.  Therapy evaluations completed due to patient decreased functional mobility was admitted for a comprehensive rehab program.   Hospital Course: Alexandria Webster was admitted to rehab 07/22/2022 for inpatient therapies to consist of PT, ST and OT at least three hours five days a week. Past admission physiatrist, therapy team and rehab RN have worked together to provide customized collaborative inpatient rehab.  Pertaining to patient's cervical myelopathy C5, 6, 7, T1 decompressive laminotomy with posterior lateral arthrodesis 07/19/2022.  Patient would follow-up neurosurgery.  Surgical site clean and dry.  SCDs for DVT prophylaxis venous Doppler studies negative.  Patient was maintained on Lovenox for DVT prophylaxis during hospital course.  Pain management use of hydrocodone as needed Valium for muscle spasms.  Blood pressure remains somewhat soft and  placed on ProAmatine.  Patient had been on lisinopril prior to admission and this remained on hold.  Follow-up outpatient.  Blood sugars monitored hemoglobin A1c 9.3 maintained on Amaryl and she would resume her Trulicity.  Synthroid ongoing for hypothyroidism.  Bouts of constipation resolved with laxative assistance.  She did have a history of hyperlipidemia remained on Pravachol.   Blood pressures were monitored on TID basis and soft and monitored  Diabetes has been monitored with ac/hs CBG checks and SSI was use prn for tighter BS control.    Rehab course: During patient's stay in rehab weekly team conferences were held to monitor patient's progress, set goals and discuss barriers to discharge. At  admission, patient required minimal assist 15 feet rolling walker minimal assist sit to stand  Physical exam.  Blood pressure 107/56 pulse 85 temperature 99 respirations 18 oxygen saturation is 94% room air Constitutional.  No acute distress HEENT Head.  Normocephalic and atraumatic Eyes.  Pupils round and reactive to light no discharge without nystagmus Neck.  Supple nontender no JVD without thyromegaly Cardiac regular rate and rhythm without any extra sounds or murmur heard Abdomen.  Soft nontender positive bowel sounds without rebound Respiratory effort normal no respiratory distress without wheeze Strength. Right upper extremity 4+ to 5 -/5 throughout Left upper extremity 4/5 SA, 4 -/5 ADF, 4 -/5 EE, 3+/5W EE, 3/5 FF, 3/5 FA Right lower extremity 4 out of 5 throughout Left lower extremity 4 out of 5 throughout Neurologic.  Cranial nerves II through XII grossly intact.  Patient alert and oriented  He/She  has had improvement in activity tolerance, balance, postural control as well as ability to compensate for deficits. He/She has had improvement in functional use RUE/LUE  and RLE/LLE as well as improvement in awareness.  Working with energy conservation techniques.  Patient ambulating 120 feet rolling walker contact-guard.  Contact-guard assist amatory transfer to the bathroom.  ADLs worked on gross grasp for completing ADLs.  Able to walk to the sink with a walker close by standby assist.  She did need some assist for lower body ADLs.  Full family teaching completed plan discharge to home       Disposition: Discharge to home    Diet: Diabetic diet  Special Instructions: No driving smoking or alcohol  Cervical collar when out of bed  Medications at discharge 1.  Tylenol as needed 2.  Aspirin 325 mg  p.o. daily 3.  Colace 100 mg p.o. daily 4.  Amaryl 2 mg p.o. daily 5.  Hydrocodone 1 to 2 tablets every 4 hours as needed pain 6.  Synthroid 50 mcg p.o. daily 7.  Zyrtec 10 mg  daily 8.  Antivert 25 mg p.o. 3 times daily as needed dizziness 9.  Trazodone 150 mg nightly as needed for sleep 10.  Nexium 40 mg twice daily 11.  MiraLAX daily as needed hold for loose stools 12.  Mevacor 20 mg nightly 13.  ProAmatine 5 mg p.o. twice daily 14.  Albuterol 2 mg daily 15.  Ventolin inhaler 1 to 2 puffs every 4 hours as needed pain 16.  Trulicity 1.5 mg into the skin weekly  30-35 minutes were spent completing discharge summary and discharge planning  Discharge Instructions     Ambulatory referral to Occupational Therapy   Complete by: As directed    Eval and treat   Ambulatory referral to Physical Medicine Rehab   Complete by: As directed    Moderate complexity follow-up 1 to 2 weeks cervical myelopathy   Ambulatory referral to Physical Therapy   Complete by: As directed    Eval and treat        Follow-up Information     Lovorn, Aundra Millet, MD Follow up.   Specialty: Physical Medicine and Rehabilitation Why: Office to call for appointment Contact information: [address] [place] [address] [phone]         Julio Sicks, MD Follow up.   Specialty: Neurosurgery Why: Call for appointment Contact information: [address] [place] [address] [phone]         Assunta Found, MD Follow up.   Specialty: Family Medicine Contact information: [address] [place] [address] [phone]         Randa Lynn, MD Follow up.   Specialty: Nephrology Why: call for appointment as needed Contact information: [address] [place] [address] [phone]                 Signed: Mcarthur Rossetti Alexandria Webster 08/05/2022, 5:10 AM

## 2022-08-01 NOTE — Progress Notes (Signed)
Physical Therapy Session Note  Patient Details  Name: Alexandria Webster MRN: [id number] Date of Birth: September 04, 1965  Today's Date: 08/01/2022 PT Individual Time: 1415-1440 PT Individual Time Calculation (min): 25 min   Short Term Goals: Week 2:  PT Short Term Goal 1 (Week 2): =LTGs d/t ELOS  Skilled Therapeutic Interventions/Progress Updates:    Chart reviewed and pt agreeable to therapy. Pt received semi-reclined in bed with no c/o pain. Session focused on amb endurance and quality and activity tolerance to promote safe home access. Pt displayed independent bed mobility to sit EOB. Pt initiated session with amb of 168ft using supervision + RW to therapy gym. Pt then completed + on NuStep for interval training with workloads 3-8 and no UE used. Pt then amb 233ft to return to room with supervision + RW. In room, pt again displayed independence with bed mobility. Session education emphasized neck precautions with requiring reminders for all precautions. At end of session, pt was left semi-reclined in bed with alarm engaged, nurse call bell and all needs in reach.     Therapy Documentation Precautions:  Precautions Precautions: Fall, Cervical Required Braces or Orthoses: Cervical Brace Cervical Brace: Soft collar Other Brace: can be off in bed, can be off for bathroom trips at night Restrictions Weight Bearing Restrictions: No    Therapy/Group: Individual Therapy  Dionne Milo, PT, DPT 08/01/2022, 2:44 PM

## 2022-08-02 LAB — GLUCOSE, CAPILLARY
Glucose-Capillary: 88 mg/dL (ref 70–99)
Glucose-Capillary: 166 mg/dL — ABNORMAL HIGH (ref 70–99)
Glucose-Capillary: 125 mg/dL — ABNORMAL HIGH (ref 70–99)

## 2022-08-02 MED ORDER — SORBITOL 70 % SOLN
45.0000 mL | Freq: Once | Status: AC
  Administered 2022-08-02: 45 mL via ORAL
  Filled 2022-08-02: qty 60

## 2022-08-02 NOTE — Progress Notes (Signed)
Occupational Therapy Session Note  Patient Details  Name: Alexandria Webster MRN: [id number] Date of Birth: 08-Jun-1965  Today's Date: 08/02/2022 OT Individual Time: 7829-5621 OT Individual Time Calculation (min): 55 min    Short Term Goals: Week 2:  OT Short Term Goal 1 (Week 2): Patient to progress towards LTG's due to LOS.  Skilled Therapeutic Interventions/Progress Updates:    Pt resting in bed upon arrival and agreeable to getting OOB for bathing/dressing and therapy. Supine>sit EOB with supervision. Pt amb with RW to w/c at sink to complete bathing/dressing tasks with sit<>stand from w/c. Pt completed bathing/dressing tasks with CGA when standing to pull pants over hips. Pt required rest breakx2 during BADLs. Pt reported she was tired from tasks but willing to continue with therapy. Pt amb with RW 124' to ortho gym. Standing activity with BUE use to complete tasks at table with CGA. Pt requested use of w/c to return to room. Pt returned to bed at end of session. Bed alarm activated and all needs within reach.   Therapy Documentation Precautions:  Precautions Precautions: Fall, Cervical Required Braces or Orthoses: Cervical Brace Cervical Brace: Soft collar Other Brace: can be off in bed, can be off for bathroom trips at night Restrictions Weight Bearing Restrictions: No Pain: Pt reports 7/10 neck pain but states she already received meds; repositioned   Therapy/Group: Individual Therapy  Rich Brave 08/02/2022, 8:58 AM

## 2022-08-02 NOTE — Progress Notes (Signed)
Physical Therapy Session Note  Patient Details  Name: Alexandria Webster MRN: [id number] Date of Birth: 12-27-1965  Today's Date: 08/02/2022 PT Individual Time: 5366-4403 PT Individual Time Calculation (min): 42 min   Short Term Goals: Week 1:  PT Short Term Goal 1 (Week 1): Pt will complete bed mobility with min assist PT Short Term Goal 1 - Progress (Week 1): Met PT Short Term Goal 2 (Week 1): Pt will complete stand pivot transfers with LRAD min assist PT Short Term Goal 2 - Progress (Week 1): Met PT Short Term Goal 3 (Week 1): Pt will initiate gait training PT Short Term Goal 3 - Progress (Week 1): Met PT Short Term Goal 4 (Week 1): Pt will tolerate sitting upright for 5 minutes without orthostasis PT Short Term Goal 4 - Progress (Week 1): Met Week 2:  PT Short Term Goal 1 (Week 2): =LTGs d/t ELOS  Skilled Therapeutic Interventions/Progress Updates: Patient supine in bed on entrance to room. Patient alert and agreeable to PT session.   Patient reported light pain earlier this morning around neck, but that it has gotten better.   Therapeutic Activity: Bed Mobility: Pt performed supine<>sit on EOB with supervision for safety. Patient performed supine to short sitting on hi/low mat and required VC to perform log roll technique per presentation of attempting to sit up straight from supine with increased effort and time. Transfers: Pt performed sit<>stand transfers throughout session with supervision. Provided VC for B UE placement per presentation of using B hands on RW and it tipping back a little bit. Patient educated on the safety of using one UE to push vs pulling up.  Gait Training:  Pt ambulated from room to ortho gym for therex, and then from ortho gym down to N tower and halfway back to room (140' x 1, 100' x 1 - patient required WC transport back to room 2/2 fatigue) using RW with supervision and WC follow for safety. Pt presented with decreased B step clearance/length with cues  provided to increase step height for safety Patient also presented with forward flexed posture (pt reported cervical pain after ambulating from room to ortho gym). PTA increased RW height and patient reported it felt better on cervical spine.   Therapeutic Exercise: Pt performed the following exercises with therapist providing the described cuing and facilitation for improvement. - B ankle pumps; x 12 B SLR; B LE 90/90 (therex performed supine in bed in order to increase blood flow prior to sitting EOB at beginning of session). Patient provided with sequence demonstration with multimodal cues for 90/90 exercise, and for slow and controlled motion throughout. - Sitting LAQ with 2.5lb ankle weights. VC for slow and controlled motion (eccentric focused). Patient performed 2 x 10 bilaterally  Patient required to lay down on hi/low mat 2/2 lightheadedness (BP monitored throughout), and decreased shortly after lying down. Patient requested to perform therex in supine, but was educated to try to sit up for upright tolerance. Patient agreed.  Patient supine in bed at end of session with brakes locked, bed alarm set, and all needs within reach.      Therapy Documentation Precautions:  Precautions Precautions: Fall, Cervical Required Braces or Orthoses: Cervical Brace Cervical Brace: Soft collar Other Brace: can be off in bed, can be off for bathroom trips at night Restrictions Weight Bearing Restrictions: No  Vitals: 107/59 (74) short sitting EOB 149/59 (84) supine in ortho gym ambulating from room and lying supine on hi/low mat  Therapy/Group: Individual Therapy  Daryan Buell PTA 08/02/2022, 12:54 PM

## 2022-08-02 NOTE — Progress Notes (Addendum)
Patient ID: EARNESTENE SHINTAKU, female   DOB: 20-Jun-1965, 57 y.o.   MRN: [id number]  [id number] left message for pt dtr Wendie Simmer to provide updates from team conference on gains made, informing on d/c date remains 5/24, would like to confirm fa edu tomorrow 9am-12pm, if pt has RW, and preferred outpatient location. SW waiting on follow-up.  SW met with pt in room to discuss above. Reports she has a RW already. Confirms her dtr will be here tomorrow for family edu. Preferred outpatient location- [facility] for PT/OT.   *SW received return phone call from pt dtr Daisey. SW discussed above, and confirms family edu. No other questions/concerns reported.   Cecile Sheerer, MSW, LCSWA Office: [phone] Cell: [phone] Fax: [phone]

## 2022-08-02 NOTE — Patient Care Conference (Signed)
Inpatient RehabilitationTeam Conference and Plan of Care Update Date: 08/02/2022   Time: 11:26 AM   Patient Name: Alexandria Webster      Medical Record Number: [id number]  Date of Birth: September 06, 1965 Sex: Female         Room/Bed: 4M10C/4M10C-01 Payor Info: Payor: Monia Pouch / Plan: [employer] QHP  / Product Type: *No Product type* /    Admit Date/Time:  07/22/2022  3:21 PM  Primary Diagnosis:  Cervical myelopathy [facility])  Hospital Problems: Principal Problem:   Cervical myelopathy (HCC) Active Problems:   Mild cognitive impairment    Expected Discharge Date: Expected Discharge Date: 08/05/22  Team Members Present: Physician leading conference: Dr. Genice Rouge Social Worker Present: Cecile Sheerer, LCSWA Nurse Present: Vedia Pereyra, RN PT Present: Bernie Covey, PT OT Present: Roney Mans, OT;Ardis Rowan, COTA PPS Coordinator present : Fae Pippin, SLP     Current Status/Progress Goal Weekly Team Focus  Bowel/Bladder   Pt is cont B/B with increased frequency LBM: 08/01/22   Pt remain continent of b/b   Toileting qshift and PRN    Swallow/Nutrition/ Hydration               ADL's   LB bathing-min A; LB dressing-CGA; UB dressing-supervisin; funtional transfers-CGA; toileting-supervision   SBA mobility, Min A BADL   BADLs, activity tolerance, educaiton, functional tranfsers    Mobility   CGA to supervision gait and transfers, still limited by inconsistent vertigo   supervision overall  transfers, endurance, education    Communication                Safety/Cognition/ Behavioral Observations               Pain   Pt states pain 8/10 in neck requiring PRN pain med   Pt states pain 0/10   Pain assessment qshiftr and PRN    Skin   Incision on back of neck - steri strips OTA   Skin integrity remain intact  Skin assessment qshift and PRN      Discharge Planning:  Pt will d/c to home with her daughter who will provide 24/7 care as she is not  working. Fam edu Wed (5/22) 9am-12pm with her daughter. SW will confirm there are no barriers to discharge.   Team Discussion: Cervical myelopathy. Continent B/B. Taking pain medications to control pain. Constipation. Increasing bowel medications. Midodrine BID. B/P improving. Thigh high teds when OOB. Still has periods of dizziness with therapy. Amaryl started. AC/HS. Soft collar. Daily weight. Incision with steri strips. Family education tomorrow.  Patient on target to meet rehab goals: yes, will meet goals for discharge 08/05/22  *See Care Plan and progress notes for long and short-term goals.   Revisions to Treatment Plan:  Medication adjustments. Monitor labs, VS, and weights.   Teaching Needs: Medications, safety, self care, gait/transfer training, skin care, etc.   Current Barriers to Discharge: Decreased caregiver support, Wound care, and Medication compliance  Possible Resolutions to Barriers: Family education, nursing education, monitor incision for s/s of infection, order recommended DME     Medical Summary Current Status: orthostatic hypotension- LBM 5/19- feels constipated- 9/10 pain when doesn't take pain meds- taking meds regularly-  Barriers to Discharge: Hypotension;Medical stability;Uncontrolled Diabetes;Uncontrolled Pain;Weight bearing restrictions;Self-care education  Barriers to Discharge Comments: limited by pain; orthostaitc hypotension- and family ed tomorrow Possible Resolutions to [employer] Focus: does great when not dizzy- last time yesterday- needs TEDs thigh highs- BP goes higher with midodrine-  d/c with outpt  PT and OT- d/c 5/24   Continued Need for Acute Rehabilitation Level of Care: The patient requires daily medical management by a physician with specialized training in physical medicine and rehabilitation for the following reasons: Direction of a multidisciplinary physical rehabilitation program to maximize functional independence : Yes Medical  management of patient stability for increased activity during participation in an intensive rehabilitation regime.: Yes Analysis of laboratory values and/or radiology reports with any subsequent need for medication adjustment and/or medical intervention. : Yes   I attest that I was present, lead the team conference, and concur with the assessment and plan of the team.   Jearld Adjutant 08/02/2022, 4:35 PM

## 2022-08-02 NOTE — Progress Notes (Signed)
PROGRESS NOTE   Subjective/Complaints:  Pt reports LBM 5/19- feeling very constipated/bloated.  Didn't remember exact day, but really wants to go today.   Pain doing well-    ROS:    Pt denies SOB, abd pain, CP, N/V/C/D, and vision changes   Except for HPI   Objective:   No results found. No results for input(s): "WBC", "HGB", "HCT", "PLT" in the last 72 hours.  No results for input(s): "NA", "K", "CL", "CO2", "GLUCOSE", "BUN", "CREATININE", "CALCIUM" in the last 72 hours.   Intake/Output Summary (Last 24 hours) at 08/02/2022 0928 Last data filed at 08/02/2022 0732 Gross per 24 hour  Intake 2740 ml  Output --  Net 2740 ml        Physical Exam: Vital Signs Blood pressure (!) 148/68, pulse 77, temperature 98.7 F (37.1 C), temperature source Oral, resp. rate 18, height 5\' 4"  (1.626 m), weight 53.4 kg, last menstrual period 01/21/2012, SpO2 100 %.          General: awake, alert, appropriate, supine- just woke up;  NAD HENT: conjugate gaze; oropharynx moist-wearing soft collar CV: regular rate and rhythm; no JVD Pulmonary: CTA B/L; no W/R/R- good air movement GI: soft, NT; somewhat distended; hypoactive BS Psychiatric: appropriate- pleasant Neurological: Ox3   Skin: C/D/I. No apparent lesions.  Posterior neck surgical site covered in clean honeycomb dressing. Some tenting on hand MSK:      No apparent deformity.      Strength:                RUE: 4+ to 5-/5 throughout                LUE: 4/5 SA, 4-/5 EF, 4-/5 EE, 3+/5 WE, 3/5 FF, 3/5 FA                 RLE: 4 out of 5 throughout                LLE: 4 of 5 throughout   Neurologic exam:  Cognition: AAO to person, place, time and event.  Language: Fluent, No substitutions or neoglisms. No dysarthria. Names 3/3 objects correctly.  Memory: Recalls 3/3 objects at 5 minutes. No apparent deficits  Insight: Good insight into current condition.  Mood:  Pleasant affect, appropriate mood.  Sensation: To light touch intact in BL UEs and LEs  Reflexes: Hyporeflexic in bilateral upper extremities and right lower extremity; hyperreflexic at left patella. Negative Hoffman's and babinski signs bilaterally.  CN: 2-12 grossly intact.  Coordination: Fine motor coordination impaired and ataxia in left upper extremity Spasticity: MAS 0 in all extremities.    Assessment/Plan: 1. Functional deficits which require 3+ hours per day of interdisciplinary therapy in a comprehensive inpatient rehab setting. Physiatrist is providing close team supervision and 24 hour management of active medical problems listed below. Physiatrist and rehab team continue to assess barriers to discharge/monitor patient progress toward functional and medical goals  Care Tool:  Bathing    Body parts bathed by patient: Right arm, Left arm, Chest, Abdomen, Front perineal area, Right upper leg, Left upper leg, Right lower leg, Left lower leg, Face, Buttocks   Body parts bathed  by helper: Front perineal area, Buttocks, Right upper leg, Left upper leg, Right lower leg, Left lower leg     Bathing assist Assist Level: Supervision/Verbal cueing     Upper Body Dressing/Undressing Upper body dressing   What is the patient wearing?: Pull over shirt Orthosis activity level: Performed by helper  Upper body assist Assist Level: Supervision/Verbal cueing    Lower Body Dressing/Undressing Lower body dressing      What is the patient wearing?: Underwear/pull up, Pants     Lower body assist Assist for lower body dressing: Contact Guard/Touching assist     Toileting Toileting Toileting Activity did not occur (Clothing management and hygiene only): Refused  Toileting assist Assist for toileting: Supervision/Verbal cueing     Transfers Chair/bed transfer  Transfers assist  Chair/bed transfer activity did not occur: Safety/medical concerns (orthostatic hypotension with  sit<>stand)  Chair/bed transfer assist level: Contact Guard/Touching assist     Locomotion Ambulation   Ambulation assist   Ambulation activity did not occur: Safety/medical concerns (orthostatic hypotension with sit<>stand)  Assist level: Contact Guard/Touching assist Assistive device: Walker-rolling Max distance: 156ft   Walk 10 feet activity   Assist  Walk 10 feet activity did not occur: Safety/medical concerns  Assist level: Contact Guard/Touching assist Assistive device: Walker-rolling   Walk 50 feet activity   Assist Walk 50 feet with 2 turns activity did not occur: Safety/medical concerns  Assist level: Contact Guard/Touching assist Assistive device: Walker-rolling    Walk 150 feet activity   Assist Walk 150 feet activity did not occur: Safety/medical concerns         Walk 10 feet on uneven surface  activity   Assist Walk 10 feet on uneven surfaces activity did not occur: Safety/medical concerns         Wheelchair     Assist Is the patient using a wheelchair?: Yes Type of Wheelchair: Manual Wheelchair activity did not occur: Safety/medical concerns (orthostatic hypotension with sit<>stand)         Wheelchair 50 feet with 2 turns activity    Assist    Wheelchair 50 feet with 2 turns activity did not occur: Safety/medical concerns       Wheelchair 150 feet activity     Assist  Wheelchair 150 feet activity did not occur: Safety/medical concerns       Blood pressure (!) 148/68, pulse 77, temperature 98.7 F (37.1 C), temperature source Oral, resp. rate 18, height 5\' 4"  (1.626 m), weight 53.4 kg, last menstrual period 01/21/2012, SpO2 100 %.  Medical Problem List and Plan: 1. Functional deficits secondary to cervical myelopathy s/p C5, 6, 7, T1 decompressive laminotomy with P CDF 07/19/2022 per Dr. Dutch Quint.               - Soft cervical collar when out of bed; can be offered bathroom trips at night.             -patient may  shower with surgical site covered             -ELOS/Goals: 7 to 10 days, PT/OT mod I to supervision level  D/c 5/24  Con't CIR PT and OT  Team conference today to finalize d/c.  2.  Antithrombotics: -DVT/anticoagulation:  Mechanical:  Antiembolism stockings, knee (TED hose) Bilateral lower extremities.  Check vascular study 5/14- Dopplers done 5/11- look good-              -antiplatelet therapy: Aspirin 325 mg daily 3. Pain Management: Hydrocodone as needed for pain,  Valium as needed for muscle spasms  5/13- pain controlled with no meds- con't regimen 5/15- taking pain meds 2-3x/day at most and no valium lately- valium likely caused her confusion prior. 5/16- will d/c Valium- pt didn't get pain meds after 11:50 pm last night- so think she needs to take pain meds more frequently- I think that's why she hurt so much-  5/17- educated pt when gets up to 10/10, needs to take pain meds q4 hours for a few times to get back under control- she voiced understanding.   5/18- pain 0/10 this AM- with pain meds more regularly. 5/21- pain controlled when takes med-s con't regimen 4. Mood/Behavior/Sleep: Trazodone 150 mg nightly.  Provide emotional support  5/13- refused trazodone, will make prn  5/16- will add Melatonin 5 mg QHS for poor sleep- 5/17- slept better but no pain meds overnight, so hurting this AM              -antipsychotic agents: N/A 5. Neuropsych/cognition: This patient is capable of making decisions on her own behalf. 6. Skin/Wound Care: Routine skin checks 7. Fluids/Electrolytes/Nutrition: Routine in and outs with follow-up chemistries 8.  COPD/quit smoking 4 years ago.  Continue albuterol as directed 9.  Hyperlipidemia.  Pravachol 10.  Hx of Hypertension with orthostatic hypotension.  Continue Lisinopril 5 mg daily, Lasix 20 mg daily         --Blood pressure soft 80s over 60s on admission; appears to have been intermittently hypotensive throughout her stay.  Receiving Lasix  approximately every other day.  Will DC for now, start daily weights. 5/14-  will d/w therapy if affecting therapy- pt c/o light headedness when sits up- wearing TEDs- will stop Lisinopril and explained to pt to drink 6-8 cups/day.  5/15- will add Midodrine 8am and noon daily 5 mg- since dropped into 80s systolic with standing again this AM- of note, weight down to 51.5 kg. 5/16- Wgt 53.5 kg- which is more towards her baseline- also appears to have less drop in BP yesterday with midodrine- down to 110s vs 70s. Con't regimen  5/17- BP running supine 130s-150s- however drops when sits up- not dry on BMP yesterday  5/18- 125 to 160s- systolic- still has orthostatic hypotension, so will not be more aggressive with BP.  5/21- BP running low 100s to 140s- doing somewhat better- no dizziness in therapy- con't regimen    08/02/2022    5:22 AM 08/02/2022    5:00 AM 08/01/2022    7:40 PM  Vitals with BMI  Weight  117 lbs 12 oz   BMI  20.2   Systolic 148  114  Diastolic 68  63  Pulse 77  75    11.  Diabetes mellitus.  Latest hemoglobin A1c 9.3.  Currently SSI, continue  5.13- restarted Amaryl- wasn't taking at home.   514- Bgs looking better- 141 to 188 since started Amaryl- much better  5/15- CBGs 100-175- doing much better- con't regimen  5/17- CBGs dropped to 60s 2 separate times yesterday- will decrease SSI to sensitive from moderate- to be less aggressive.   5/18- CBGs 1-1=198= due to decreased SSI- will keep an eye on her CBGs and titrate as required/monitor trend. 5/19- CBGs 159-230- will try increasing Amaryl slightly to 2 mg daily- had drops in Cbgs with mod SSI, but hopefully will not as much with increase in Amaryl  5/20 well controlled today, continue regimen 5/21- CBGs 88-176- con't regimen- only 1 value over 150 CBG (last 3)  Recent  Labs    08/01/22 1646 08/01/22 2049 08/02/22 0623  GLUCAP 117* 176* 88    12.  Hypothyroidism. Continue Synthroid 13.  Constipation. Continue  Colace 100 mg daily, Dulcolax suppository daily as needed, MiraLAX daily as needed  5.13- sorbitol 45cc- if no results, wrote for Soap suds enema.   5/14- very large BM last night after sorbitol- didn't need enema  5/16- LBM 3 days ago- will add Miralax as well as Sorbitol 30cc x1  5/17- will give 45 cc Sorbitol and if no BM, give soap suds enema  5/18- had multiple BM's- had suppository- refused enema. 5/19- will add Senna since LBM 2 days ago- and taking pain meds, so miralax probably not enough- 2 tabs daily  5/20 LBM yesterday, improved 5/21- doesn't remember LBM (was 5/19) but reports constipation- will give sorbitol and soap suds if need be.   14. Low grade fever  5/14- T-100 degrees this AM- felt dizzy, but sounds like not fevrish Sx's- got tylenol- feels better- U/A and CXR yesterday negative- could be atelectasis- so added flutter valve. Dopplers also negative- asked pt to drink 6-8 cups/day of water  5/15- T 99.7 this AM, but not feeling badly. Will check labs again in AM  5/16- no elevated temp  5/18- no more low grade temps/resolved 15. Indigestion, improved  -Pepcid PRN ordered   I spent a total of  37  minutes on total care today- >50% coordination of care- due to d/w pt about bowels as well as review of CBGs and Bps- no orthostatic hypotension Sx's affecting therapy in last 24 hours- also team conference to finalize d.c     LOS: 11 days A FACE TO FACE EVALUATION WAS PERFORMED  Loribeth Katich 08/02/2022, 9:28 AM

## 2022-08-02 NOTE — Progress Notes (Signed)
Physical Therapy Session Note  Patient Details  Name: Alexandria Webster MRN: [id number] Date of Birth: February 26, 1966  Today's Date: 08/02/2022 PT Individual Time: 1415-1526 PT Individual Time Calculation (min): 71 min   Short Term Goals: Week 2:  PT Short Term Goal 1 (Week 2): =LTGs d/t ELOS  Skilled Therapeutic Interventions/Progress Updates:    pt received in bed and agreeable to therapy. Pt reports she just recd pain medication, rest and positioning for unrated pain throughout session.   Pt ambulated to/from therapy gym with CGA and RW. Note L external rotation and L>R knee hyperextension thrust in stance   Therapeutic exercise for BLE strength to improve gait mechanics and functional mobility.  Pt performed squat to mat table 4 x 6, with cues for technique. Pt required supine rest break some sets for neck pain.   At this time, pt reports urgent need for bathroom. Ambulated back to room and to commode, but pt found to have urge incontinence.  Extended time for voiding. Assist for clothing management d/t urgency and incontinence. Pt able to perform hygiene in standing with assist for thoroughness d/t incontinence. Pt reports feeling unwell after BM, so Stand pivot transfer with RW for safety and hand hygiene at w/c level. Pt reports feeling better in supine, but still like she might have more BM's today.   Pt performed bed level exercises d/t feeling lightheaded and queasy/ill after BM. Bridges 5 x 10 for improved endurance and hip extension strength. Unilateral bicycling for core strength and LE coordination 3 x 12 BIL 3 x 10 clams and reverse clams on L side for improved neutral rotation during gait   Pt remained in bed and was left with all needs in reach and alarm active.   Therapy Documentation Precautions:  Precautions Precautions: Fall, Cervical Required Braces or Orthoses: Cervical Brace Cervical Brace: Soft collar Other Brace: can be off in bed, can be off for bathroom trips  at night Restrictions Weight Bearing Restrictions: No General:   Vital Signs: Therapy Vitals Temp: 98.2 F (36.8 C) Pulse Rate: 83 Resp: 18 BP: 129/61 Patient Position (if appropriate): Lying Oxygen Therapy SpO2: 100 % O2 Device: Room Air Pain: Pain Assessment Pain Scale: 0-10 Pain Score: 9  Pain Type: Acute pain Pain Location: Neck Pain Descriptors / Indicators: Aching Pain Frequency: Constant Pain Onset: On-going Pain Intervention(s): Medication (See eMAR) Mobility:   Locomotion :    Trunk/Postural Assessment :    Balance:   Exercises:   Other Treatments:      Therapy/Group: Individual Therapy  Juluis Rainier 08/02/2022, 2:25 PM

## 2022-08-02 NOTE — Progress Notes (Signed)
Occupational Therapy Session Note  Patient Details  Name: Alexandria Webster MRN: [id number] Date of Birth: Nov 06, 1965  Today's Date: 08/02/2022 OT Individual Time: 1130-1200 OT Individual Time Calculation (min): 30 min    Short Term Goals: Week 2:  OT Short Term Goal 1 (Week 2): Patient to progress towards LTG's due to LOS.  Skilled Therapeutic Interventions/Progress Updates:    OT intervention with focus on bed mobility, functional amb with RW, standing balance, and BLE strengthening to increase independence with BADLs. Supine<>sit EOB with supervision. Amb with RW into hallway to ortho gym (124') with CGA. 5 mins NuStep BLE only on level 5. Pt amb with RW back to room and returned to bed. No LOB noted and no unsafe behaviors noted. Pt remained in bed with all needs within reach. Bed alarm activated.   Therapy Documentation Precautions:  Precautions Precautions: Fall, Cervical Required Braces or Orthoses: Cervical Brace Cervical Brace: Soft collar Other Brace: can be off in bed, can be off for bathroom trips at night Restrictions Weight Bearing Restrictions: No  Pain:  Pt reports her pain is "ok" and does not need any meds   Therapy/Group: Individual Therapy  Rich Brave 08/02/2022, 12:01 PM

## 2022-08-03 LAB — GLUCOSE, CAPILLARY
Glucose-Capillary: 75 mg/dL (ref 70–99)
Glucose-Capillary: 229 mg/dL — ABNORMAL HIGH (ref 70–99)
Glucose-Capillary: 121 mg/dL — ABNORMAL HIGH (ref 70–99)
Glucose-Capillary: 153 mg/dL — ABNORMAL HIGH (ref 70–99)

## 2022-08-03 MED ORDER — ACETAMINOPHEN 325 MG PO TABS: 650.0000 mg | ORAL_TABLET | ORAL | Status: DC | PRN

## 2022-08-03 MED ORDER — HYDROCERIN EX CREA
TOPICAL_CREAM | Freq: Two times a day (BID) | CUTANEOUS | Status: DC
  Filled 2022-08-03: qty 113

## 2022-08-03 NOTE — Progress Notes (Signed)
Patient ID: SHERMYA ATTEBERY, female   DOB: Sep 07, 1965, 57 y.o.   MRN: [id number]  SW faxed outpatient PT/OT referral to [facility] (p:[phone]/f:[phone]).  OT requests RW for pt as the RW she has at home is too old.   SW ordered RW with [employer] via parachute.   *SW returned phone call and left message for pt dtr Daisey to inform on RW ordered, and informed on d/c process. SW encouraged follow-up.   Cecile Sheerer, MSW, LCSWA Office: [phone] Cell: [phone] Fax: [phone]

## 2022-08-03 NOTE — Progress Notes (Signed)
PROGRESS NOTE   Subjective/Complaints:  Pt reports got cleaned out yesterday- feeling better after sorbitol.  Doesn't sleep well long term so worse here in hospital.  Itching on L leg and R arm- sounds like/looks like dry skin.   Pain "pretty good"- taking Norco 2-3/x day usually.  Is tolerable.   LBM yesterday- huge  ROS:    Pt denies SOB, abd pain, CP, N/V/C/D, and vision changes   Except for HPI   Objective:   No results found. No results for input(s): "WBC", "HGB", "HCT", "PLT" in the last 72 hours.  No results for input(s): "NA", "K", "CL", "CO2", "GLUCOSE", "BUN", "CREATININE", "CALCIUM" in the last 72 hours.   Intake/Output Summary (Last 24 hours) at 08/03/2022 1046 Last data filed at 08/03/2022 0740 Gross per 24 hour  Intake 713 ml  Output --  Net 713 ml        Physical Exam: Vital Signs Blood pressure 135/60, pulse 81, temperature 97.8 F (36.6 C), temperature source Oral, resp. rate 16, height 5\' 4"  (1.626 m), weight 53.5 kg, last menstrual period 01/21/2012, SpO2 98 %.        General: awake, alert, appropriate, curled up in bed on side; NAD HENT: conjugate gaze; oropharynx moist CV: regular rate and rhythm; no JVD Pulmonary: CTA B/L; no W/R/R- good air movement GI: soft, NT, ND, (+)BS- more normoactive Psychiatric: appropriate but sleepy- a little dazed actually Neurological: Ox3- sleepy  Skin: C/D/I. No apparent lesions.  Posterior neck surgical site covered in clean honeycomb dressing. Some tenting on hand MSK:      No apparent deformity.      Strength:                RUE: 4+ to 5-/5 throughout                LUE: 4/5 SA, 4-/5 EF, 4-/5 EE, 3+/5 WE, 3/5 FF, 3/5 FA                 RLE: 4 out of 5 throughout                LLE: 4 of 5 throughout   Neurologic exam:  Cognition: AAO to person, place, time and event.  Language: Fluent, No substitutions or neoglisms. No dysarthria.  Names 3/3 objects correctly.  Memory: Recalls 3/3 objects at 5 minutes. No apparent deficits  Insight: Good insight into current condition.  Mood: Pleasant affect, appropriate mood.  Sensation: To light touch intact in BL UEs and LEs  Reflexes: Hyporeflexic in bilateral upper extremities and right lower extremity; hyperreflexic at left patella. Negative Hoffman's and babinski signs bilaterally.  CN: 2-12 grossly intact.  Coordination: Fine motor coordination impaired and ataxia in left upper extremity Spasticity: MAS 0 in all extremities.    Assessment/Plan: 1. Functional deficits which require 3+ hours per day of interdisciplinary therapy in a comprehensive inpatient rehab setting. Physiatrist is providing close team supervision and 24 hour management of active medical problems listed below. Physiatrist and rehab team continue to assess barriers to discharge/monitor patient progress toward functional and medical goals  Care Tool:  Bathing    Body parts bathed by patient:  Right arm, Left arm, Chest, Abdomen, Front perineal area, Right upper leg, Left upper leg, Right lower leg, Left lower leg, Face, Buttocks   Body parts bathed by helper: Front perineal area, Buttocks, Right upper leg, Left upper leg, Right lower leg, Left lower leg     Bathing assist Assist Level: Supervision/Verbal cueing     Upper Body Dressing/Undressing Upper body dressing   What is the patient wearing?: Pull over shirt Orthosis activity level: Performed by helper  Upper body assist Assist Level: Supervision/Verbal cueing    Lower Body Dressing/Undressing Lower body dressing      What is the patient wearing?: Underwear/pull up, Pants     Lower body assist Assist for lower body dressing: Contact Guard/Touching assist     Toileting Toileting Toileting Activity did not occur (Clothing management and hygiene only): Refused  Toileting assist Assist for toileting: Supervision/Verbal cueing      Transfers Chair/bed transfer  Transfers assist  Chair/bed transfer activity did not occur: Safety/medical concerns (orthostatic hypotension with sit<>stand)  Chair/bed transfer assist level: Contact Guard/Touching assist     Locomotion Ambulation   Ambulation assist   Ambulation activity did not occur: Safety/medical concerns (orthostatic hypotension with sit<>stand)  Assist level: Contact Guard/Touching assist Assistive device: Walker-rolling Max distance: 161ft   Walk 10 feet activity   Assist  Walk 10 feet activity did not occur: Safety/medical concerns  Assist level: Contact Guard/Touching assist Assistive device: Walker-rolling   Walk 50 feet activity   Assist Walk 50 feet with 2 turns activity did not occur: Safety/medical concerns  Assist level: Contact Guard/Touching assist Assistive device: Walker-rolling    Walk 150 feet activity   Assist Walk 150 feet activity did not occur: Safety/medical concerns         Walk 10 feet on uneven surface  activity   Assist Walk 10 feet on uneven surfaces activity did not occur: Safety/medical concerns         Wheelchair     Assist Is the patient using a wheelchair?: Yes Type of Wheelchair: Manual Wheelchair activity did not occur: Safety/medical concerns (orthostatic hypotension with sit<>stand)         Wheelchair 50 feet with 2 turns activity    Assist    Wheelchair 50 feet with 2 turns activity did not occur: Safety/medical concerns       Wheelchair 150 feet activity     Assist  Wheelchair 150 feet activity did not occur: Safety/medical concerns       Blood pressure 135/60, pulse 81, temperature 97.8 F (36.6 C), temperature source Oral, resp. rate 16, height 5\' 4"  (1.626 m), weight 53.5 kg, last menstrual period 01/21/2012, SpO2 98 %.  Medical Problem List and Plan: 1. Functional deficits secondary to cervical myelopathy s/p C5, 6, 7, T1 decompressive laminotomy with P CDF  07/19/2022 per Dr. Dutch Quint.               - Soft cervical collar when out of bed; can be offered bathroom trips at night.             -patient may shower with surgical site covered             -ELOS/Goals: 7 to 10 days, PT/OT mod I to supervision level  D/c 5/24  Con't CIR PT and OT-  2.  Antithrombotics: -DVT/anticoagulation:  Mechanical:  Antiembolism stockings, knee (TED hose) Bilateral lower extremities.  Check vascular study 5/14- Dopplers done 5/11- look good-              -  antiplatelet therapy: Aspirin 325 mg daily 3. Pain Management: Hydrocodone as needed for pain, Valium as needed for muscle spasms  5/13- pain controlled with no meds- con't regimen 5/15- taking pain meds 2-3x/day at most and no valium lately- valium likely caused her confusion prior. 5/16- will d/c Valium- pt didn't get pain meds after 11:50 pm last night- so think she needs to take pain meds more frequently- I think that's why she hurt so much-  5/17- educated pt when gets up to 10/10, needs to take pain meds q4 hours for a few times to get back under control- she voiced understanding.   5/22- pain well controlled on current regimen- taking 2-3x/day usually- will need at d/c for 7 days, then f/u with surgeon 4. Mood/Behavior/Sleep: Trazodone 150 mg nightly.  Provide emotional support  5/13- refused trazodone, will make prn  5/16- will add Melatonin 5 mg QHS for poor sleep- 5/17- slept better but no pain meds overnight, so hurting this AM              -antipsychotic agents: N/A 5. Neuropsych/cognition: This patient is capable of making decisions on her own behalf. 6. Skin/Wound Care: Routine skin checks 7. Fluids/Electrolytes/Nutrition: Routine in and outs with follow-up chemistries 8.  COPD/quit smoking 4 years ago.  Continue albuterol as directed 9.  Hyperlipidemia.  Pravachol 10.  Hx of Hypertension with orthostatic hypotension.  Continue Lisinopril 5 mg daily, Lasix 20 mg daily         --Blood pressure soft 80s  over 60s on admission; appears to have been intermittently hypotensive throughout her stay.  Receiving Lasix approximately every other day.  Will DC for now, start daily weights. 5/14-  will d/w therapy if affecting therapy- pt c/o light headedness when sits up- wearing TEDs- will stop Lisinopril and explained to pt to drink 6-8 cups/day.  5/15- will add Midodrine 8am and noon daily 5 mg- since dropped into 80s systolic with standing again this AM- of note, weight down to 51.5 kg. 5/16- Wgt 53.5 kg- which is more towards her baseline- also appears to have less drop in BP yesterday with midodrine- down to 110s vs 70s. Con't regimen  5/17- BP running supine 130s-150s- however drops when sits up- not dry on BMP yesterday  5/18- 125 to 160s- systolic- still has orthostatic hypotension, so will not be more aggressive with BP.  5/21--5/22 BP running low 100s to 140s- doing somewhat better- no dizziness in therapy- con't regimen    08/03/2022    4:42 AM 08/02/2022    7:32 PM 08/02/2022   12:49 PM  Vitals with BMI  Weight 117 lbs 15 oz    BMI 20.24    Systolic 135 145 782  Diastolic 60 55 61  Pulse 81 73 83    11.  Diabetes mellitus.  Latest hemoglobin A1c 9.3.  Currently SSI, continue  5.13- restarted Amaryl- wasn't taking at home.   514- Bgs looking better- 141 to 188 since started Amaryl- much better  5/15- CBGs 100-175- doing much better- con't regimen  5/17- CBGs dropped to 60s 2 separate times yesterday- will decrease SSI to sensitive from moderate- to be less aggressive.   5/18- CBGs 1-1=198= due to decreased SSI- will keep an eye on her CBGs and titrate as required/monitor trend. 5/19- CBGs 159-230- will try increasing Amaryl slightly to 2 mg daily- had drops in Cbgs with mod SSI, but hopefully will not as much with increase in Amaryl  5/20 well controlled today, continue  regimen 5/21- CBGs 88-176- con't regimen- only 1 value over 150 CBG (last 3)  Recent Labs    08/02/22 1131  08/02/22 1647 08/03/22 0634  GLUCAP 166* 125* 75    12.  Hypothyroidism. Continue Synthroid 13.  Constipation. Continue Colace 100 mg daily, Dulcolax suppository daily as needed, MiraLAX daily as needed  5.13- sorbitol 45cc- if no results, wrote for Soap suds enema.   5/14- very large BM last night after sorbitol- didn't need enema  5/16- LBM 3 days ago- will add Miralax as well as Sorbitol 30cc x1  5/17- will give 45 cc Sorbitol and if no BM, give soap suds enema  5/18- had multiple BM's- had suppository- refused enema. 5/19- will add Senna since LBM 2 days ago- and taking pain meds, so miralax probably not enough- 2 tabs daily  5/20 LBM yesterday, improved 5/21- doesn't remember LBM (was 5/19) but reports constipation- will give sorbitol and soap suds if need be.  5/22- huge BM yesterday after sorbitol- con't regimen  14. Low grade fever  5/14- T-100 degrees this AM- felt dizzy, but sounds like not fevrish Sx's- got tylenol- feels better- U/A and CXR yesterday negative- could be atelectasis- so added flutter valve. Dopplers also negative- asked pt to drink 6-8 cups/day of water  5/15- T 99.7 this AM, but not feeling badly. Will check labs again in AM  5/16- no elevated temp  5/18- no more low grade temps/resolved 15. Indigestion, improved  -Pepcid PRN ordered 16. Dry skin-   5/22 will order Eucerin BID for dry skin on arms and legs     LOS: 12 days A FACE TO FACE EVALUATION WAS PERFORMED  Gabrial Domine 08/03/2022, 10:46 AM

## 2022-08-03 NOTE — Progress Notes (Signed)
Recreational Therapy Session Note  Patient Details  Name: Alexandria Webster MRN: [id number] Date of Birth: 27-Apr-1965 Today's Date: 08/03/2022 Goal:  Pt will ambulate on outdoor community surfaces with contact guard assist using RW.  MET Pain: no c/o Skilled Therapeutic Interventions/Progress Updates: Pt stated feeling tired from previous therapy sessions but agreeable to participate.  Pt taken outside w/c level for time and energy management.  Once outside, pt ambulated with RW around the fountain/gazing ball with contact guard ~200' before needing seated rest break.  Pt then ambulated ~150' with RW over uneven paved terrain.  Pt completed PTA instructed exercises in standing after another seated rest break. Returned to the unit w/c level total assist.  Pt stated she was almost on empty, referring to energy level.  Pt agreeable to standing ball toss activity tossing/catching a ball 10 x 2 with seated rest break in between.  Therapy/Group: Co-Treatment  Rosamae Rocque 08/03/2022, 3:38 PM

## 2022-08-03 NOTE — Progress Notes (Signed)
Occupational Therapy Session Note  Patient Details  Name: Alexandria Webster MRN: [id number] Date of Birth: [birth year]  Today's Date: 08/03/2022 OT Individual Time: 0930-1040 OT Individual Time Calculation (min): 70 min    Short Term Goals: Week 2:  OT Short Term Goal 1 (Week 2): Patient to progress towards LTG's due to LOS.  Skilled Therapeutic Interventions/Progress Updates:    Pt resting in bed upon arrival with daughter present for education. Assisted pt with donning [facility]. Reviewed purpose of [facility] for BO regulation/orthostasis. Pt donned pants with supervision. Reviewed pt's currect assist level (supervision). Reviewed recommendation that pt requires 24 hour supervision and should not be left alone. Pt's daughter verbalized understanding of recommendations. Pt amb with RW to ADL apartment and practiced walk-in shower tranfers with daughter. Pt practiced bed mobility on std bed. Pt amb with RW to ortho gym-NuStep level 5 3 mins and level 3 3 mins BLE only. Pt amb with RW to gym and rested. Pt amb with RW back to room and returned to bed. All needs within reach. Bed alarm activated. Daughter present.   Therapy Documentation Precautions:  Precautions Precautions: Fall, Cervical Required Braces or Orthoses: Cervical Brace Cervical Brace: Soft collar Other Brace: can be off in bed, can be off for bathroom trips at night Restrictions Weight Bearing Restrictions: No   Pain:   Therapy/Group: Individual Therapy  Rich Brave 08/03/2022, 10:43 AM

## 2022-08-03 NOTE — Progress Notes (Signed)
Physical Therapy Session Note  Patient Details  Name: Alexandria Webster MRN: [id number] Date of Birth: 1965/08/15  Today's Date: 08/03/2022 PT Individual Time:  [phone] and 4098-1191 PT Individual Time Calculation (min): 45 and  48 min   Short Term Goals: Week 2:  PT Short Term Goal 1 (Week 2): =LTGs d/t ELOS  Skilled Therapeutic Interventions/Progress Updates: Pt presented in bed with dgt present agreeable to therapy. Pt states fatigue but denies pain. Session focused on family education with dgt. Discussed with dgt pt's CLOF and that recommendation is supervision upon d/c. Dgt verbalized understanding. Pt performed bed mobility mod I with use of bed features. PTA adjusted pt's new RW (youth) and pt ambulated to ortho gym >11ft with supervision. PTA noted mild hyperextension on L knee but no overt snapping noted. At car simulator pt performed car transfer sedan height with supervision and then ambulated up/down ramp as well as ambulation on mulch with supervision, CGA respectively. While pt resting PTA discussed use of foot stool and demonstrated for car entry for taller vehicle as dgt owns taller SUV which is used on occasion. Pt then ambulated to main gym and during seated rest discussed stairs for home entry. Dgt confirmed more similar to 3in steps and able to reach both hand rails. Pt then ascended/descended x 8 steps with B rails and supervision with pt ascending with step through pattern and descending with step to pattern. Pt then ambulated across gym to mat and participated in standing balance activity incorporating toe taps without AD performing with CGA. After activity pt indicated significant increased fatigue (denies dizziness/lightheadedness). During extended rest break PTA discussed with pt and dgt energy conservation and importance of such during transition back to home. Pt and dgt verbalized understanding. Pt then stating continued to feel too fatigue to walk back to room. PTA obtained  w/c and transported pt back to room due to fatigue. Pt then performed ambulatory transfer back to bed with CGA without AD. Pt returned to supine mod I and left in bed at end of session with bed alarm on, call bell within reach and needs met.   Tx2: Pt presented in bed agreeable to therapy. Misty Stanley, RT present throughout session. Pt agreeable to go outside to work on ambulation in community setting. Pt performed bed mobility mod I and PTA donned shoes for time management. Performed ambulatory transfer with supervision and RW to w/c. Pt transported outside to [facility] entrance for time management. Participated in several bouts of ambulation with RW and CGA fading to close supervision. Pt ambulated x 3 bouts with seated rest breaks between 100-252ft (across patio). Pt noted to demonstrate good safety with grades as well as over drain grate. Pt also participated in standing therex for general conditioning and endurance including hip abd/add 2 x 10, mini squats x 10, and heel raises 2 x 10. Pt transported back to unit and in main gym participated in ball taps without AD and CGA 2 x 10 with seated rest break. Pt then ambulated ~18ft without AD and CGA with increased lateral sway, moderately increased bilateral knee hyperextension noted but no LOB. After ambulation pt noted significant fatigue and requested to return to room. Pt transported back to room dependently and performed ambulatory transfer without AD back to bed with CGA. Pt was able to transfer to supine mod I and reposition without assist. Pt left in bed at end of session with bed alarm on, call bell within reach and needs met.  Therapy Documentation Precautions:  Precautions Precautions: Fall, Cervical Required Braces or Orthoses: Cervical Brace Cervical Brace: Soft collar Other Brace: can be off in bed, can be off for bathroom trips at night Restrictions Weight Bearing Restrictions: No General: PT Amount of Missed Time (min): 43 Minutes PT Missed  Treatment Reason: Other (Comment)/fatigue Vital Signs: Therapy Vitals Temp: 98.1 F (36.7 C) Temp Source: Oral Pulse Rate: 81 Resp: 18 BP: (!) 108/50 Patient Position (if appropriate): Lying Oxygen Therapy SpO2: 100 % O2 Device: Room Air Pain: Pain Assessment Pain Scale: 0-10 Pain Score: 9  Pain Type: Acute pain Pain Location: Neck Pain Orientation: Posterior Pain Descriptors / Indicators: Aching Pain Frequency: Constant Pain Onset: On-going Pain Intervention(s): Medication (See eMAR)  Therapy/Group: Individual Therapy  Ziana Heyliger 08/03/2022, 4:29 PM

## 2022-08-04 LAB — GLUCOSE, CAPILLARY
Glucose-Capillary: 125 mg/dL — ABNORMAL HIGH (ref 70–99)
Glucose-Capillary: 116 mg/dL — ABNORMAL HIGH (ref 70–99)
Glucose-Capillary: 175 mg/dL — ABNORMAL HIGH (ref 70–99)
Glucose-Capillary: 136 mg/dL — ABNORMAL HIGH (ref 70–99)

## 2022-08-04 MED ORDER — TRAZODONE HCL 150 MG PO TABS: 150.0000 mg | ORAL_TABLET | Freq: Every evening | ORAL | 0 refills | Status: DC | PRN

## 2022-08-04 MED ORDER — MECLIZINE HCL 25 MG PO TABS: 25.0000 mg | ORAL_TABLET | Freq: Three times a day (TID) | ORAL | 0 refills | Status: AC | PRN

## 2022-08-04 MED ORDER — LEVOTHYROXINE SODIUM 50 MCG PO TABS: 50.0000 ug | ORAL_TABLET | Freq: Every day | ORAL | 0 refills | Status: DC

## 2022-08-04 MED ORDER — GLIMEPIRIDE 2 MG PO TABS: 2.0000 mg | ORAL_TABLET | Freq: Every day | ORAL | 0 refills | Status: DC

## 2022-08-04 MED ORDER — ESOMEPRAZOLE MAGNESIUM 40 MG PO CPDR
40.0000 mg | DELAYED_RELEASE_CAPSULE | Freq: Two times a day (BID) | ORAL | 0 refills | Status: DC
Start: 2022-08-04 — End: 2022-10-10

## 2022-08-04 MED ORDER — SORBITOL 70 % SOLN
45.0000 mL | Freq: Once | Status: AC
  Administered 2022-08-04: 45 mL via ORAL
  Filled 2022-08-04: qty 60

## 2022-08-04 MED ORDER — ALBUTEROL SULFATE HFA 108 (90 BASE) MCG/ACT IN AERS: 1.0000 | INHALATION_SPRAY | RESPIRATORY_TRACT | 0 refills | Status: AC | PRN

## 2022-08-04 MED ORDER — MIDODRINE HCL 5 MG PO TABS: 5.0000 mg | ORAL_TABLET | Freq: Two times a day (BID) | ORAL | 0 refills | Status: DC

## 2022-08-04 MED ORDER — HYDROCODONE-ACETAMINOPHEN 5-325 MG PO TABS: 1.0000 | ORAL_TABLET | ORAL | 0 refills | Status: DC | PRN

## 2022-08-04 MED ORDER — ALBUTEROL SULFATE 2 MG PO TABS: 2.0000 mg | ORAL_TABLET | Freq: Every day | ORAL | 0 refills | Status: DC

## 2022-08-04 MED ORDER — LOVASTATIN 20 MG PO TABS: 20.0000 mg | ORAL_TABLET | Freq: Every day | ORAL | 0 refills | Status: DC

## 2022-08-04 NOTE — Progress Notes (Signed)
Occupational Therapy Session Note  Patient Details  Name: Alexandria Webster MRN: [id number] Date of Birth: 08-Oct-1965  Today's Date: 08/04/2022 OT Individual Time: 0815-0910 OT Individual Time Calculation (min): 55 min    Short Term Goals: Week 2:  OT Short Term Goal 1 (Week 2): Patient to progress towards LTG's due to LOS.  Skilled Therapeutic Interventions/Progress Updates:    OT intervention with focus on bathing/dressing with sit<>stand from w/c at sink. See below for assist levels. Amb in room with RW at supervision level. Reviewed home safety recommendations and cervical precautions. Pt at supervision level overall. Pt pleased with progress and ready for d/c home tomorrow.   Therapy Documentation Precautions:  Precautions Precautions: Fall, Cervical Required Braces or Orthoses: Cervical Brace Cervical Brace: Soft collar Other Brace: can be off in bed, can be off for bathroom trips at night Restrictions Weight Bearing Restrictions: No Pain: Pt reports 8/10 posterior neck pain; meds admin by RN during session; repositioned ADL: ADL Eating: Independent Where Assessed-Eating: Edge of bed Grooming: Independent Where Assessed-Grooming: Sitting at sink Upper Body Bathing: Independent Where Assessed-Upper Body Bathing: Sitting at sink Lower Body Bathing: Supervision/safety Where Assessed-Lower Body Bathing: Sitting at sink, Standing at sink Upper Body Dressing: Independent Where Assessed-Upper Body Dressing: Sitting at sink Lower Body Dressing: Supervision/safety Where Assessed-Lower Body Dressing: Standing at sink, Sitting at sink Toileting: Supervision/safety Where Assessed-Toileting: Teacher, adult education: Distant supervision Statistician Method: Ambulating Tub/Shower Transfer: Unable to assess (limited by lethargy/dizziness) Tub/Shower Transfer Method: Unable to assess Film/video editor: Close supervision Film/video editor Method: Musician: Information systems manager with back Vision Baseline Vision/History: 1 Wears glasses (pt reports she is unable to see out of Lt eye) Patient Visual Report: No change from baseline Vision Assessment?: Yes Eye Alignment: Within Functional Limits Ocular Range of Motion: Within Functional Limits Alignment/Gaze Preference: Within Defined Limits Tracking/Visual Pursuits: Decreased smoothness of horizontal tracking;Left eye does not track laterally Saccades: Additional eye shifts occurred during testing;Impaired - to be further tested in functional context;Overshoots Convergence: Impaired (comment) Visual Fields: Left visual field deficit Depth Perception: Overshoots  Therapy/Group: Individual Therapy  Rich Brave 08/04/2022, 9:12 AM

## 2022-08-04 NOTE — Progress Notes (Signed)
Physical Therapy Session Note  Patient Details  Name: Alexandria Webster MRN: [id number] Date of Birth: 1965/06/12  Today's Date: 08/04/2022 PT Individual Time: 4098-1191 PT Individual Time Calculation (min): 49 min  and  Today's Date: 08/04/2022 PT Missed Time: 26 Minutes Missed Time Reason: Patient fatigue  Short Term Goals: Week 2:  PT Short Term Goal 1 (Week 2): =LTGs d/t ELOS  Skilled Therapeutic Interventions/Progress Updates:    Pt received supine in bed awake and agreeable to therapy session. Supine>sitting R EOB, mod-I. Sit<>stands mod-I using RW during session. Gait training ~183ft to ortho gym using RW with supervision for safety - has some L LE external rotation throughout gait (pt also does not have tennis shoes at the time so gripper socks slide a little at toe-off because she has a good gait speed) and slightly wider BOS.   Pt reports she feels well prepared for D/C tomorrow with no specific questions/concerns. Pt reports her LTGs, after D/C from CIR, are to continue working on ambulating without an AD with improved balance.  Gait training ~65ft + ~178ft using L HHA progressed to no UE support with min assist for balance. Pt demonstrating the following gait deviations with therapist providing the described cuing and facilitation for improvement:  - wide BOS  - continues to have quick L LE toe-off with gripper sock sliding a little  - has R arm swing and cuing to encourage L  arm swing  - minor increased postural sway with posterior lean bias  Dynamic gait training using agility ladder including the following:  - forward step-to pattern down/back x2 reps (cuing to attempt reciprocal pattern but unsuccessful) pt feeling more confident leading with R LE compared to L LE - consistent min assist for balance throughout with primarilyy posterior lean   Pt reporting significant fatigue and requesting to return to bed and rest in preparation for D/C home tomorrow. Transported back to  room. R stand pivot w/c>EOB using RW with supervision. Sit>supine mod-I. Pt left supine in bed with needs in reach, bed alarm on, and NT present. Missed 26 minutes of skilled physical therapy.  Therapy Documentation Precautions:  Precautions Precautions: Fall, Cervical Precaution Comments: Pt was cued for precautions during functional mobility. Required Braces or Orthoses: Cervical Brace Cervical Brace: Soft collar Other Brace: can be off in bed, can be off for bathroom trips at night Restrictions Weight Bearing Restrictions: No   Pain:  Denies pain during session.    Therapy/Group: Individual Therapy  Ginny Forth , PT, DPT, NCS, CSRS 08/04/2022, 12:53 PM

## 2022-08-04 NOTE — Progress Notes (Signed)
PROGRESS NOTE   Subjective/Complaints:  Pt reports LBM 2 days ago- doesn't want BM on day of d/c- would be willing to do Sorbitol today.  ROS:    Pt denies SOB, abd pain, CP, N/V/C/D, and vision changes   Except for HPI   Objective:   No results found. No results for input(s): "WBC", "HGB", "HCT", "PLT" in the last 72 hours.  No results for input(s): "NA", "K", "CL", "CO2", "GLUCOSE", "BUN", "CREATININE", "CALCIUM" in the last 72 hours.   Intake/Output Summary (Last 24 hours) at 08/04/2022 0856 Last data filed at 08/03/2022 1750 Gross per 24 hour  Intake 472 ml  Output --  Net 472 ml        Physical Exam: Vital Signs Blood pressure (!) 103/48, pulse 73, temperature 97.9 F (36.6 C), temperature source Oral, resp. rate 16, height 5\' 4"  (1.626 m), weight 56 kg, last menstrual period 01/21/2012, SpO2 97 %.         General: awake, alert, appropriate, just woke up- supine in bed; NAD HENT: conjugate gaze; oropharynx moist CV: regular rate; no JVD Pulmonary: CTA B/L; no W/R/R- good air movement GI: soft, NT, ND, (+)BS- hypoactive Psychiatric: appropriate- a little flat/sleepy Neurological: Ox3- sleepy - just woke up Skin: C/D/I. No apparent lesions.  Posterior neck surgical site covered in clean honeycomb dressing. Some tenting on hand MSK:      No apparent deformity.      Strength:                RUE: 4+ to 5-/5 throughout                LUE: 4/5 SA, 4-/5 EF, 4-/5 EE, 3+/5 WE, 3/5 FF, 3/5 FA                 RLE: 4 out of 5 throughout                LLE: 4 of 5 throughout   Neurologic exam:  Cognition: AAO to person, place, time and event.  Language: Fluent, No substitutions or neoglisms. No dysarthria. Names 3/3 objects correctly.  Memory: Recalls 3/3 objects at 5 minutes. No apparent deficits  Insight: Good insight into current condition.  Mood: Pleasant affect, appropriate mood.  Sensation: To  light touch intact in BL UEs and LEs  Reflexes: Hyporeflexic in bilateral upper extremities and right lower extremity; hyperreflexic at left patella. Negative Hoffman's and babinski signs bilaterally.  CN: 2-12 grossly intact.  Coordination: Fine motor coordination impaired and ataxia in left upper extremity Spasticity: MAS 0 in all extremities.    Assessment/Plan: 1. Functional deficits which require 3+ hours per day of interdisciplinary therapy in a comprehensive inpatient rehab setting. Physiatrist is providing close team supervision and 24 hour management of active medical problems listed below. Physiatrist and rehab team continue to assess barriers to discharge/monitor patient progress toward functional and medical goals  Care Tool:  Bathing    Body parts bathed by patient: Right arm, Left arm, Chest, Abdomen, Front perineal area, Right upper leg, Left upper leg, Right lower leg, Left lower leg, Face, Buttocks   Body parts bathed by helper: Front perineal area,  Buttocks, Right upper leg, Left upper leg, Right lower leg, Left lower leg     Bathing assist Assist Level: Supervision/Verbal cueing     Upper Body Dressing/Undressing Upper body dressing   What is the patient wearing?: Pull over shirt Orthosis activity level: Performed by helper  Upper body assist Assist Level: Independent    Lower Body Dressing/Undressing Lower body dressing      What is the patient wearing?: Underwear/pull up, Pants     Lower body assist Assist for lower body dressing: Supervision/Verbal cueing     Toileting Toileting Toileting Activity did not occur (Clothing management and hygiene only): Refused  Toileting assist Assist for toileting: Supervision/Verbal cueing     Transfers Chair/bed transfer  Transfers assist  Chair/bed transfer activity did not occur: Safety/medical concerns (orthostatic hypotension with sit<>stand)  Chair/bed transfer assist level: Supervision/Verbal cueing      Locomotion Ambulation   Ambulation assist   Ambulation activity did not occur: Safety/medical concerns (orthostatic hypotension with sit<>stand)  Assist level: Supervision/Verbal cueing Assistive device: Walker-rolling Max distance: 130ft   Walk 10 feet activity   Assist  Walk 10 feet activity did not occur: Safety/medical concerns  Assist level: Supervision/Verbal cueing Assistive device: Walker-rolling   Walk 50 feet activity   Assist Walk 50 feet with 2 turns activity did not occur: Safety/medical concerns  Assist level: Supervision/Verbal cueing Assistive device: Walker-rolling    Walk 150 feet activity   Assist Walk 150 feet activity did not occur: Safety/medical concerns         Walk 10 feet on uneven surface  activity   Assist Walk 10 feet on uneven surfaces activity did not occur: Safety/medical concerns   Assist level: Contact Guard/Touching assist Assistive device: Walker-rolling   Wheelchair     Assist Is the patient using a wheelchair?: Yes Type of Wheelchair: Manual Wheelchair activity did not occur: Safety/medical concerns (orthostatic hypotension with sit<>stand)         Wheelchair 50 feet with 2 turns activity    Assist    Wheelchair 50 feet with 2 turns activity did not occur: Safety/medical concerns       Wheelchair 150 feet activity     Assist  Wheelchair 150 feet activity did not occur: Safety/medical concerns       Blood pressure (!) 103/48, pulse 73, temperature 97.9 F (36.6 C), temperature source Oral, resp. rate 16, height 5\' 4"  (1.626 m), weight 56 kg, last menstrual period 01/21/2012, SpO2 97 %.  Medical Problem List and Plan: 1. Functional deficits secondary to cervical myelopathy s/p C5, 6, 7, T1 decompressive laminotomy with P CDF 07/19/2022 per Dr. Dutch Quint.               - Soft cervical collar when out of bed; can be offered bathroom trips at night.             -patient may shower with surgical  site covered             -ELOS/Goals: 7 to 10 days, PT/OT mod I to supervision level  D/c 5/24  Con't CIR PT and OT- family training done 2.  Antithrombotics: -DVT/anticoagulation:  Mechanical:  Antiembolism stockings, knee (TED hose) Bilateral lower extremities.  Check vascular study 5/14- Dopplers done 5/11- look good-              -antiplatelet therapy: Aspirin 325 mg daily 3. Pain Management: Hydrocodone as needed for pain, Valium as needed for muscle spasms  5/13- pain controlled with no  meds- con't regimen 5/15- taking pain meds 2-3x/day at most and no valium lately- valium likely caused her confusion prior. 5/16- will d/c Valium- pt didn't get pain meds after 11:50 pm last night- so think she needs to take pain meds more frequently- I think that's why she hurt so much-  5/17- educated pt when gets up to 10/10, needs to take pain meds q4 hours for a few times to get back under control- she voiced understanding.   5/23- pain well controlled on current regimen- taking 2-3x/day usually- will need at d/c for 7 days, then f/u with me x1 4. Mood/Behavior/Sleep: Trazodone 150 mg nightly.  Provide emotional support  5/13- refused trazodone, will make prn  5/16- will add Melatonin 5 mg QHS for poor sleep- 5/17- slept better but no pain meds overnight, so hurting this AM              -antipsychotic agents: N/A 5. Neuropsych/cognition: This patient is capable of making decisions on her own behalf. 6. Skin/Wound Care: Routine skin checks 7. Fluids/Electrolytes/Nutrition: Routine in and outs with follow-up chemistries 8.  COPD/quit smoking 4 years ago.  Continue albuterol as directed 9.  Hyperlipidemia.  Pravachol 10.  Hx of Hypertension with orthostatic hypotension.  Continue Lisinopril 5 mg daily, Lasix 20 mg daily         --Blood pressure soft 80s over 60s on admission; appears to have been intermittently hypotensive throughout her stay.  Receiving Lasix approximately every other day.  Will DC  for now, start daily weights. 5/14-  will d/w therapy if affecting therapy- pt c/o light headedness when sits up- wearing TEDs- will stop Lisinopril and explained to pt to drink 6-8 cups/day.  5/15- will add Midodrine 8am and noon daily 5 mg- since dropped into 80s systolic with standing again this AM- of note, weight down to 51.5 kg. 5/16- Wgt 53.5 kg- which is more towards her baseline- also appears to have less drop in BP yesterday with midodrine- down to 110s vs 70s. Con't regimen  5/17- BP running supine 130s-150s- however drops when sits up- not dry on BMP yesterday  5/18- 125 to 160s- systolic- still has orthostatic hypotension, so will not be more aggressive with BP.  5/21--5/22 BP running low 100s to 140s- doing somewhat better- no dizziness in therapy- con't regimen    08/04/2022    5:23 AM 08/03/2022    7:46 PM 08/03/2022    1:01 PM  Vitals with BMI  Weight 123 lbs 7 oz    BMI 21.18    Systolic 103 140 536  Diastolic 48 62 50  Pulse 73 71 81    11.  Diabetes mellitus.  Latest hemoglobin A1c 9.3.  Currently SSI, continue  5.13- restarted Amaryl- wasn't taking at home.   514- Bgs looking better- 141 to 188 since started Amaryl- much better  5/15- CBGs 100-175- doing much better- con't regimen  5/17- CBGs dropped to 60s 2 separate times yesterday- will decrease SSI to sensitive from moderate- to be less aggressive.   5/18- CBGs 1-1=198= due to decreased SSI- will keep an eye on her CBGs and titrate as required/monitor trend. 5/19- CBGs 159-230- will try increasing Amaryl slightly to 2 mg daily- had drops in Cbgs with mod SSI, but hopefully will not as much with increase in Amaryl  5/20 well controlled today, continue regimen 5/23- CBGs looking good overall- con't regimen CBG (last 3)  Recent Labs    08/03/22 1631 08/03/22 2037 08/04/22 6440  GLUCAP 121* 153* 125*    12.  Hypothyroidism. Continue Synthroid 13.  Constipation. Continue Colace 100 mg daily, Dulcolax  suppository daily as needed, MiraLAX daily as needed  5.13- sorbitol 45cc- if no results, wrote for Soap suds enema.   5/14- very large BM last night after sorbitol- didn't need enema  5/16- LBM 3 days ago- will add Miralax as well as Sorbitol 30cc x1  5/17- will give 45 cc Sorbitol and if no BM, give soap suds enema  5/18- had multiple BM's- had suppository- refused enema. 5/19- will add Senna since LBM 2 days ago- and taking pain meds, so miralax probably not enough- 2 tabs daily  5/20 LBM yesterday, improved 5/21- doesn't remember LBM (was 5/19) but reports constipation- will give sorbitol and soap suds if need be.  5/22- huge BM yesterday after sorbitol- con't regimen 5/23- will give sorbitol 1 more time, so doesn't go 3 days without a BM  14. Low grade fever  5/14- T-100 degrees this AM- felt dizzy, but sounds like not fevrish Sx's- got tylenol- feels better- U/A and CXR yesterday negative- could be atelectasis- so added flutter valve. Dopplers also negative- asked pt to drink 6-8 cups/day of water  5/15- T 99.7 this AM, but not feeling badly. Will check labs again in AM  5/16- no elevated temp  5/18- no more low grade temps/resolved 15. Indigestion, improved  -Pepcid PRN ordered 16. Dry skin-   5/22 will order Eucerin BID for dry skin on arms and legs      LOS: 13 days A FACE TO FACE EVALUATION WAS PERFORMED  Naryah Clenney 08/04/2022, 8:56 AM

## 2022-08-04 NOTE — Progress Notes (Signed)
Inpatient Rehabilitation Discharge Medication Review by a Pharmacist  A complete drug regimen review was completed for this patient to identify any potential clinically significant medication issues.  High Risk Drug Classes Is patient taking? Indication by Medication  Antipsychotic No   Anticoagulant No   Antibiotic No   Opioid Yes PRN Norco - moderate pain  Antiplatelet Yes Aspirin 325 mg - peripheral vascular disease  Hypoglycemics/insulin Yes Glimipiride, Trulicity - glucose control  Vasoactive Medication Yes Midodrine - blood pressure support  Chemotherapy No   Other Yes Albuterol tablet daily - asthma Cetirizine - allergies Docusate - laxative Esomeprazole - GERD Levothyroxine - supplement Lovastatin - hyperlipidemia  PRNs: Acetaminophen - mild pain or temp > 100.5 Albuterol inhaler - wheezing or shortness or breath Meclizine - dizziness Miralax - constipation Trazodone - sleep     Type of Medication Issue Identified Description of Issue Recommendation(s)  Drug Interaction(s) (clinically significant)     Duplicate Therapy     Allergy     No Medication Administration End Date     Incorrect Dose     Additional Drug Therapy Needed     Significant med changes from prior encounter (inform family/care partners about these prior to discharge). Lisinopril, furosemide and prn Meloxicam discontinued. Communicate with patient/family.  Other       Clinically significant medication issues were identified that warrant physician communication and completion of prescribed/recommended actions by midnight of the next day:  No  Pharmacist comments:   Time spent performing this drug regimen review (minutes):  20   Dennie Fetters, [place] 08/04/2022 4:44 PM

## 2022-08-04 NOTE — Progress Notes (Signed)
Occupational Therapy Session Note  Patient Details  Name: Alexandria Webster MRN: [id number] Date of Birth: 08-03-1965  Today's Date: 08/04/2022 OT Individual Time: 1300-1330 OT Individual Time Calculation (min): 30 min    Short Term Goals: Week 2:  OT Short Term Goal 1 (Week 2): Patient to progress towards LTG's due to LOS.  Skilled Therapeutic Interventions/Progress Updates:    OT intervention with focus on functional amb with RW, BUE therex, sitting balance, educaiton, and discharge planning. Pt amb with RW 157' to ortho gym. BUE therex with 1# weight bar at shoulder height reaching out. Reviewed cervical precautions and home safety recommendations. Pt returned to room and sat in recliner. All needs within reach.  Therapy Documentation Precautions:  Precautions Precautions: Fall, Cervical Precaution Comments: Pt was cued for precautions during functional mobility. Required Braces or Orthoses: Cervical Brace Cervical Brace: Soft collar Other Brace: can be off in bed, can be off for bathroom trips at night Restrictions Weight Bearing Restrictions: No  Pain:  Pt denies pain this afternoon   Therapy/Group: Individual Therapy  Rich Brave 08/04/2022, 1:37 PM

## 2022-08-04 NOTE — Progress Notes (Signed)
Occupational Therapy Discharge Summary  Patient Details  Name: Alexandria Webster MRN: [id number] Date of Birth: [birth year]  Date of Discharge from OT service:Aug 04, 2022  Patient has met 12 of 12 long term goals due to improved activity tolerance, improved balance, postural control, ability to compensate for deficits, and improved coordination.  Pt made excellent progress with BADLs and functional transfers during this admission. Pt completes all BADLs and transfers at supervision level using RW. Toileiting and transfers with supervision. Pt's daughter has been present for education and provides appropriate level of supervision. Pt and daughter verbalized understanding of recommendation for 24/7 supervision. Patient to discharge at overall Supervision level.  Patient's care partner is independent to provide the necessary physical assistance at discharge.    Reasons goals not met: n/a  Recommendation:  Patient will benefit from ongoing skilled OT services in home health setting to continue to advance functional skills in the area of BADL and Reduce care partner burden.  Equipment: No equipment provided  Reasons for discharge: treatment goals met and discharge from hospital  Patient/family agrees with progress made and goals achieved: Yes  OT Discharge  ADL ADL Eating: Independent Where Assessed-Eating: Edge of bed Grooming: Independent Where Assessed-Grooming: Sitting at sink Upper Body Bathing: Independent Where Assessed-Upper Body Bathing: Sitting at sink Lower Body Bathing: Supervision/safety Where Assessed-Lower Body Bathing: Sitting at sink, Standing at sink Upper Body Dressing: Independent Where Assessed-Upper Body Dressing: Sitting at sink Lower Body Dressing: Supervision/safety Where Assessed-Lower Body Dressing: Standing at sink, Sitting at sink Toileting: Supervision/safety Where Assessed-Toileting: Teacher, adult education: Distant supervision Statistician Method:  Ambulating Tub/Shower Transfer: Unable to assess (limited by lethargy/dizziness) Tub/Shower Transfer Method: Unable to assess Film/video editor: Close supervision Film/video editor Method: Designer, industrial/product: Information systems manager with back Vision Baseline Vision/History: 1 Wears glasses (pt reports she is unable to see out of Lt eye) Patient Visual Report: No change from baseline Vision Assessment?: Yes Eye Alignment: Within Functional Limits Ocular Range of Motion: Within Functional Limits Alignment/Gaze Preference: Within Defined Limits Tracking/Visual Pursuits: Decreased smoothness of horizontal tracking;Left eye does not track laterally Saccades: Additional eye shifts occurred during testing;Impaired - to be further tested in functional context;Overshoots Convergence: Impaired (comment) Visual Fields: Left visual field deficit Depth Perception: Overshoots Perception  Perception: Within Functional Limits Praxis Praxis: Intact Cognition Cognition Overall Cognitive Status: Within Functional Limits for tasks assessed Arousal/Alertness: Awake/alert Orientation Level: Person;Place;Situation Person: Oriented Place: Oriented Situation: Oriented Memory: Impaired Attention: Sustained Sustained Attention: Appears intact Sustained Attention Impairment: Verbal complex Selective Attention: Appears intact Awareness: Appears intact Problem Solving: Appears intact Safety/Judgment: Appears intact Brief Interview for Mental Status (BIMS) Repetition of Three Words (First Attempt): 3 Temporal Orientation: Year: Correct Temporal Orientation: Month: Accurate within 5 days Temporal Orientation: Day: Incorrect Recall: "Sock": Yes, no cue required Recall: "Blue": Yes, no cue required Recall: "Bed": Yes, no cue required BIMS Summary Score: 14 Sensation Sensation Light Touch: Appears Intact Hot/Cold: Appears Intact Proprioception: Impaired by gross  assessment Stereognosis: Not tested Coordination Gross Motor Movements are Fluid and Coordinated: No Fine Motor Movements are Fluid and Coordinated: No Finger Nose Finger Test: delayed bilaterally Motor  Motor Motor: Ataxia;Abnormal postural alignment and control Motor - Skilled Clinical Observations: uncoordinated movements noted in BUE/BLE with mobility Trunk/Postural Assessment  Cervical Assessment Cervical Assessment: Exceptions to [facility] (soft cervical collar) Cervical Strength Overall Cervical Strength Comments: impaired due to surgery, maintains kyphotic posture Thoracic Assessment Thoracic Assessment: Exceptions to [facility] Thoracic Strength Overall Thoracic Strength Comments: maintains  kyphotic posture unable to correct due to pain and weakness Lumbar Assessment Lumbar Assessment: Exceptions to [facility] Postural Control Postural Control: Deficits on evaluation Head Control: decreased due to surgery, kyphotic posture with soft collar Righting Reactions: decreased Protective Responses: decreased  Balance Static Sitting Balance Static Sitting - Balance Support: Feet supported Static Sitting - Level of Assistance: 6: Modified independent (Device/Increase time) Dynamic Sitting Balance Sitting balance - Comments: sitting EOB with kyphotic posture. Extremity/Trunk Assessment RUE Assessment RUE Assessment: Exceptions to [facility] Active Range of Motion (AROM) Comments: WFL elbow > hand, did not assess proximally 2/2 cerivical precautions LUE Assessment LUE Assessment: Exceptions to [facility] [address] [facility] [address] General Strength Comments: Did not formally assess proximally but 3-/5 at shoulder within functional context, 3+/5 distally including grasp   Rich Brave 08/04/2022, 8:52 AM

## 2022-08-04 NOTE — Progress Notes (Signed)
Inpatient Rehabilitation Care Coordinator Discharge Note   Patient Details  Name: DANNAN ESPINAL MRN: [id number] Date of Birth: 04-21-1965   Discharge location: D/c to home with her dtr  Length of Stay: 14 days  Discharge activity level: Supervision  Home/community participation: Limited  Patient response WU:[id number] Literacy - How often do you need to have someone help you when you read instructions, pamphlets, or other written material from your doctor or pharmacy?: Never  Patient response YN:WGNFAO Isolation - How often do you feel lonely or isolated from those around you?: Never  Services provided included: MD, RD, PT, OT, RN, TR, Pharmacy, SW, Neuropsych, CM, SLP  Financial Services:  Field seismologist Utilized: Physiological scientist [facility]  Choices offered to/list presented to: pt and pt dtr  Follow-up services arranged:  Outpatient, DME    Outpatient Servicies: [facility] for outpatient PT/OT DME : [facility] for RW    Patient response to transportation need: Is the patient able to respond to transportation needs?: Yes In the past 12 months, has lack of transportation kept you from medical appointments or from getting medications?: No In the past 12 months, has lack of transportation kept you from meetings, work, or from getting things needed for daily living?: No   Patient/Family verbalized understanding of follow-up arrangements:  Yes  Individual responsible for coordination of the follow-up plan: contact pt dtr Daisey [phone]  Confirmed correct DME delivered: Gretchen Short 08/04/2022    Comments (or additional information):fam edu completed  Summary of Stay    Date/Time Discharge Planning CSW  08/01/22 1131 Pt will d/c to home with her daughter who will provide 24/7 care as she is not working. Fam edu Wed (5/22) 9am-12pm with her daughter. SW will confirm there are no barriers to discharge. AAC  07/25/22 1353 Pt will d/c to home with  her daughter who will provide 24/7 care as she is not working. SW will confirm there are no barriers to discharge. AAC       Auria A Lula Olszewski

## 2022-08-04 NOTE — Progress Notes (Signed)
Physical Therapy Session Note  Patient Details  Name: Alexandria Webster MRN: [id number] Date of Birth: Jan 23, 1966  Today's Date: 08/04/2022 PT Individual Time: 1110-1153 PT Individual Time Calculation (min): 43 min   Short Term Goals: Week 2:  PT Short Term Goal 1 (Week 2): =LTGs d/t ELOS  Skilled Therapeutic Interventions/Progress Updates:     pt received in bed and agreeable to therapy. Pt reports unrated mild pain this am, premedicated. Rest and positioning provided as needed.  Session focused on d/c assessments as documented below. Pt continues to ambulate with supervision, >200 ft with RW. Mild ataxia and L>R genu recurvatum noted during gait. Car transfer with supervision. Cueing for safety with RW at times throughout session.  Pt navigated 6"  and 3" stairs with 2 hand rails and supervision. Discussed home set up and d/c plan throughout. Pt returned to room and set up in recliner at end of session with all needs in reach.   Therapy Documentation Precautions:  Precautions Precautions: Fall, Cervical Precaution Comments: Pt was cued for precautions during functional mobility. Required Braces or Orthoses: Cervical Brace Cervical Brace: Soft collar Other Brace: can be off in bed, can be off for bathroom trips at night Restrictions Weight Bearing Restrictions: No  Mobility: Bed Mobility Bed Mobility: Rolling Right;Rolling Left;Supine to Sit;Sit to Supine Rolling Right: Independent Rolling Left: Independent Supine to Sit: Independent Sit to Supine: Independent Transfers Transfers: Sit to Stand;Stand to Sit Sit to Stand: Supervision/Verbal cueing Stand to Sit: Supervision/Verbal cueing Transfer (Assistive device): Rolling walker Locomotion : Gait Ambulation: Yes Gait Assistance: Supervision/Verbal cueing Gait Distance (Feet): 200 Feet Assistive device: Rolling walker Gait Assistance Details: Verbal cues for safe use of DME/AE;Verbal cues for technique;Verbal cues for  precautions/safety Gait Gait: Yes Gait Pattern: Impaired Gait Pattern: Right foot flat;Left foot flat;Ataxic;Left genu recurvatum Stairs / Additional Locomotion Stairs: Yes Stairs Assistance: Supervision/Verbal cueing Stair Management Technique: Two rails Number of Stairs: 12 Height of Stairs: 6 Ramp: Supervision/Verbal cueing Wheelchair Mobility Wheelchair Mobility: No  Trunk/Postural Assessment : Cervical Assessment Cervical Assessment: Exceptions to [facility] Cervical Strength Overall Cervical Strength: Deficits Overall Cervical Strength Comments: impaired due to surgery, maintains kyphotic posture Thoracic Assessment Thoracic Assessment: Exceptions to [facility] Thoracic Strength Overall Thoracic Strength Comments: maintains kyphotic posture unable to correct due to pain and weakness Lumbar Assessment Lumbar Assessment: Exceptions to [facility] Postural Control Postural Control: Deficits on evaluation Head Control: decreased due to surgery, kyphotic posture with soft collar Trunk Control: decreased due to surgery, kyphotic posture with soft collar Righting Reactions: decreased Protective Responses: decreased Postural Limitations: decreased  Balance: Balance Balance Assessed: Yes Static Sitting Balance Static Sitting - Balance Support: Feet supported Static Sitting - Level of Assistance: 6: Modified independent (Device/Increase time) Dynamic Sitting Balance Dynamic Sitting - Balance Support: Feet supported Dynamic Sitting - Level of Assistance: 6: Modified independent (Device/Increase time) Sitting balance - Comments: sitting EOB with kyphotic posture. Static Standing Balance Static Standing - Balance Support: Bilateral upper extremity supported Static Standing - Level of Assistance: 6: Modified independent (Device/Increase time) Dynamic Standing Balance Dynamic Standing - Balance Support: Bilateral upper extremity supported;During functional activity Dynamic Standing - Level of  Assistance: 5: Stand by assistance     Therapy/Group: Individual Therapy  Juluis Rainier 08/04/2022, 12:54 PM

## 2022-08-04 NOTE — Progress Notes (Signed)
Recreational Therapy Session Note  Patient Details  Name: Alexandria Webster MRN: [id number] Date of Birth: Jul 14, 1965 Today's Date: 08/04/2022  Pain: no c/o Skilled Therapeutic Interventions/Progress Updates: Met with pt to discuss stress management & discharge planning.  Pt is excited about going home with family tomorrow and states feeling prepared for discharge.  Discussed importance of self advocacy, directing her care and returning to community pursuits.    Therapy/Group: Individual Therapy   [name] 08/04/2022, 8:52 AM

## 2022-08-04 NOTE — Progress Notes (Signed)
Physical Therapy Discharge Summary  Patient Details  Name: Alexandria Webster MRN: [id number] Date of Birth: [birth year]  Date of Discharge from PT service:Aug 04, 2022  Today's Date: 08/04/2022   Patient has met 6 of 6 long term goals due to improved activity tolerance, improved balance, improved postural control, increased strength, increased range of motion, decreased pain, ability to compensate for deficits, and improved coordination.  Patient to discharge at an ambulatory level Supervision using RW.  Patient's care partner is independent to provide the necessary physical assistance at discharge. Pt to d/c home with her daughter who has undergone family training.   Reasons goals not met: NA  Recommendation:  Patient will benefit from ongoing skilled PT services in outpatient setting to continue to advance safe functional mobility, address ongoing impairments in balance, coordination, strength, gait mechanics, and minimize fall risk.  Equipment: RW  Reasons for discharge: treatment goals met and discharge from hospital  Patient/family agrees with progress made and goals achieved: Yes  PT Discharge Precautions/Restrictions Precautions Precautions: Fall;Cervical Precaution Comments: Pt was cued for precautions during functional mobility. Required Braces or Orthoses: Cervical Brace Cervical Brace: Soft collar Other Brace: can be off in bed, can be off for bathroom trips at night Restrictions Weight Bearing Restrictions: No Pain Interference Pain Interference Pain Effect on Sleep: 3. Frequently Pain Interference with Therapy Activities: 2. Occasionally Pain Interference with Day-to-Day Activities: 3. Frequently Vision/Perception  Vision - History Ability to See in Adequate Light: 2 Moderately impaired Vision - Assessment Eye Alignment: Within Functional Limits Ocular Range of Motion: Within Functional Limits Alignment/Gaze Preference: Within Defined Limits Tracking/Visual  Pursuits: Decreased smoothness of horizontal tracking;Left eye does not track laterally Saccades: Additional eye shifts occurred during testing;Impaired - to be further tested in functional context;Overshoots Convergence: Impaired (comment) Perception Perception: Within Functional Limits Praxis Praxis: Intact  Cognition Overall Cognitive Status: Within Functional Limits for tasks assessed Arousal/Alertness: Awake/alert Orientation Level: Oriented X4 Year: 2024 Month: May Day of Week: Correct Attention: Sustained Sustained Attention: Appears intact Sustained Attention Impairment: Verbal complex Selective Attention: Appears intact Memory: Impaired Awareness: Appears intact Problem Solving: Appears intact Safety/Judgment: Appears intact Sensation Sensation Light Touch: Appears Intact Hot/Cold: Appears Intact Proprioception: Impaired by gross assessment Stereognosis: Not tested Coordination Gross Motor Movements are Fluid and Coordinated: No Fine Motor Movements are Fluid and Coordinated: No Coordination and Movement Description: assessed functionally, demonstrates decreased coordination BLEs Finger Nose Finger Test: delayed bilaterally Motor  Motor Motor: Ataxia;Abnormal postural alignment and control Motor - Skilled Clinical Observations: uncoordinated movements noted in BUE/BLE with mobility  Mobility Bed Mobility Bed Mobility: Rolling Right;Rolling Left;Supine to Sit;Sit to Supine Rolling Right: Independent Rolling Left: Independent Supine to Sit: Independent Sit to Supine: Independent Transfers Transfers: Sit to Stand;Stand to Sit Sit to Stand: Supervision/Verbal cueing Stand to Sit: Supervision/Verbal cueing Transfer (Assistive device): Rolling walker Locomotion  Gait Ambulation: Yes Gait Assistance: Supervision/Verbal cueing Gait Distance (Feet): 200 Feet Assistive device: Rolling walker Gait Assistance Details: Verbal cues for safe use of DME/AE;Verbal cues  for technique;Verbal cues for precautions/safety Gait Gait: Yes Gait Pattern: Impaired Gait Pattern: Right foot flat;Left foot flat;Ataxic;Left genu recurvatum Stairs / Additional Locomotion Stairs: Yes Stairs Assistance: Supervision/Verbal cueing Stair Management Technique: Two rails Number of Stairs: 12 Height of Stairs: 6 Ramp: Supervision/Verbal cueing Wheelchair Mobility Wheelchair Mobility: No  Trunk/Postural Assessment  Cervical Assessment Cervical Assessment: Exceptions to [facility] Cervical Strength Overall Cervical Strength: Deficits Overall Cervical Strength Comments: impaired due to surgery, maintains kyphotic posture Thoracic Assessment Thoracic Assessment: Exceptions  to [facility] Thoracic Strength Overall Thoracic Strength Comments: maintains kyphotic posture unable to correct due to pain and weakness Lumbar Assessment Lumbar Assessment: Exceptions to [facility] Postural Control Postural Control: Deficits on evaluation Head Control: decreased due to surgery, kyphotic posture with soft collar Trunk Control: decreased due to surgery, kyphotic posture with soft collar Righting Reactions: decreased Protective Responses: decreased Postural Limitations: decreased  Balance Balance Balance Assessed: Yes Static Sitting Balance Static Sitting - Balance Support: Feet supported Static Sitting - Level of Assistance: 6: Modified independent (Device/Increase time) Dynamic Sitting Balance Dynamic Sitting - Balance Support: Feet supported Dynamic Sitting - Level of Assistance: 6: Modified independent (Device/Increase time) Sitting balance - Comments: sitting EOB with kyphotic posture. Static Standing Balance Static Standing - Balance Support: Bilateral upper extremity supported Static Standing - Level of Assistance: 6: Modified independent (Device/Increase time) Dynamic Standing Balance Dynamic Standing - Balance Support: Bilateral upper extremity supported;During functional  activity Dynamic Standing - Level of Assistance: 5: Stand by assistance Extremity Assessment  RUE Assessment RUE Assessment: Exceptions to [facility] Active Range of Motion (AROM) Comments: WFL elbow > hand, did not assess proximally 2/2 cerivical precautions LUE Assessment LUE Assessment: Exceptions to [facility] General Strength Comments: Did not formally assess proximally but 3-/5 at shoulder within functional context, 3+/5 distally including grasp RLE Assessment RLE Assessment: Exceptions to [facility] General Strength Comments: hip and knee grossly 3+/5, ankle 4/5 LLE Assessment LLE Assessment: Exceptions to [facility] General Strength Comments: hip and knee grossly 3+/5, ankle 4/5   Olivia C Brittain 08/04/2022, 11:50 AM

## 2022-08-05 LAB — GLUCOSE, CAPILLARY: Glucose-Capillary: 178 mg/dL — ABNORMAL HIGH (ref 70–99)

## 2022-08-05 NOTE — Progress Notes (Signed)
PROGRESS NOTE   Subjective/Complaints:    Pt had good BM yesterday with sorbitol Asked her to take Miralax and Senna at home to help her empty while on pain meds- can wean down as weans pain meds- always has constipation issues, even prior to hospitalization  Ready for d/c.   ROS:    Pt denies SOB, abd pain, CP, N/V/C/D, and vision changes   Except for HPI   Objective:   No results found. No results for input(s): "WBC", "HGB", "HCT", "PLT" in the last 72 hours.  No results for input(s): "NA", "K", "CL", "CO2", "GLUCOSE", "BUN", "CREATININE", "CALCIUM" in the last 72 hours.   Intake/Output Summary (Last 24 hours) at 08/05/2022 0817 Last data filed at 08/05/2022 0751 Gross per 24 hour  Intake 948 ml  Output --  Net 948 ml        Physical Exam: Vital Signs Blood pressure (!) 105/42, pulse 72, temperature 97.9 F (36.6 C), resp. rate 18, height 5\' 4"  (1.626 m), weight 55.6 kg, last menstrual period 01/21/2012, SpO2 99 %.          General: awake, alert, appropriate, supine in bed; just woke up;  NAD HENT: conjugate gaze; oropharynx moist CV: regular rate and rhythm; no JVD Pulmonary: CTA B/L; no W/R/R- good air movement GI: soft, NT, ND, (+)BS- more normoactive Psychiatric: appropriate Neurological: Ox3  Skin: C/D/I. No apparent lesions. Posterior neck has some steristrips- but incision looks great- no dressing. Some tenting on hand MSK:      No apparent deformity.      Strength:                RUE: 4+ to 5-/5 throughout                LUE: 4/5 SA, 4-/5 EF, 4-/5 EE, 3+/5 WE, 3/5 FF, 3/5 FA                 RLE: 4 out of 5 throughout                LLE: 4 of 5 throughout   Neurologic exam:  Cognition: AAO to person, place, time and event.  Language: Fluent, No substitutions or neoglisms. No dysarthria. Names 3/3 objects correctly.  Memory: Recalls 3/3 objects at 5 minutes. No apparent deficits   Insight: Good insight into current condition.  Mood: Pleasant affect, appropriate mood.  Sensation: To light touch intact in BL UEs and LEs  Reflexes: Hyporeflexic in bilateral upper extremities and right lower extremity; hyperreflexic at left patella. Negative Hoffman's and babinski signs bilaterally.  CN: 2-12 grossly intact.  Coordination: Fine motor coordination impaired and ataxia in left upper extremity Spasticity: MAS 0 in all extremities.    Assessment/Plan: 1. Functional deficits which require 3+ hours per day of interdisciplinary therapy in a comprehensive inpatient rehab setting. Physiatrist is providing close team supervision and 24 hour management of active medical problems listed below. Physiatrist and rehab team continue to assess barriers to discharge/monitor patient progress toward functional and medical goals  Care Tool:  Bathing    Body parts bathed by patient: Right arm, Left arm, Chest, Abdomen, Front perineal area, Right upper  leg, Left upper leg, Right lower leg, Left lower leg, Face, Buttocks   Body parts bathed by helper: Front perineal area, Buttocks, Right upper leg, Left upper leg, Right lower leg, Left lower leg     Bathing assist Assist Level: Supervision/Verbal cueing     Upper Body Dressing/Undressing Upper body dressing   What is the patient wearing?: Pull over shirt Orthosis activity level: Performed by helper  Upper body assist Assist Level: Independent    Lower Body Dressing/Undressing Lower body dressing      What is the patient wearing?: Underwear/pull up, Pants     Lower body assist Assist for lower body dressing: Supervision/Verbal cueing     Toileting Toileting Toileting Activity did not occur (Clothing management and hygiene only): Refused  Toileting assist Assist for toileting: Supervision/Verbal cueing     Transfers Chair/bed transfer  Transfers assist  Chair/bed transfer activity did not occur: Safety/medical concerns  (orthostatic hypotension with sit<>stand)  Chair/bed transfer assist level: Supervision/Verbal cueing     Locomotion Ambulation   Ambulation assist   Ambulation activity did not occur: Safety/medical concerns (orthostatic hypotension with sit<>stand)  Assist level: Supervision/Verbal cueing Assistive device: Walker-rolling Max distance: 200 ft   Walk 10 feet activity   Assist  Walk 10 feet activity did not occur: Safety/medical concerns  Assist level: Supervision/Verbal cueing Assistive device: Walker-rolling   Walk 50 feet activity   Assist Walk 50 feet with 2 turns activity did not occur: Safety/medical concerns  Assist level: Supervision/Verbal cueing Assistive device: Walker-rolling    Walk 150 feet activity   Assist Walk 150 feet activity did not occur: Safety/medical concerns         Walk 10 feet on uneven surface  activity   Assist Walk 10 feet on uneven surfaces activity did not occur: Safety/medical concerns   Assist level: Supervision/Verbal cueing Assistive device: Walker-rolling   Wheelchair     Assist Is the patient using a wheelchair?: No Type of Wheelchair: Manual Wheelchair activity did not occur: N/A         Wheelchair 50 feet with 2 turns activity    Assist    Wheelchair 50 feet with 2 turns activity did not occur: N/A       Wheelchair 150 feet activity     Assist  Wheelchair 150 feet activity did not occur: N/A       Blood pressure (!) 105/42, pulse 72, temperature 97.9 F (36.6 C), resp. rate 18, height 5\' 4"  (1.626 m), weight 55.6 kg, last menstrual period 01/21/2012, SpO2 99 %.  Medical Problem List and Plan: 1. Functional deficits secondary to cervical myelopathy s/p C5, 6, 7, T1 decompressive laminotomy with P CDF 07/19/2022 per Dr. Dutch Quint.               - Soft cervical collar when out of bed; can be offered bathroom trips at night.             -patient may shower with surgical site covered              -ELOS/Goals: 7 to 10 days, PT/OT mod I to supervision level  D/c today- will need f/u with me- will get 7 days of pain meds, then will need to call for refills if still needs pain meds- educated will need to take even more Miralax and Senna at home to empty regularly- at least q3 days.  2.  Antithrombotics: -DVT/anticoagulation:  Mechanical:  Antiembolism stockings, knee (TED hose) Bilateral lower extremities.  Check  vascular study 5/14- Dopplers done 5/11- look good-              -antiplatelet therapy: Aspirin 325 mg daily 3. Pain Management: Hydrocodone as needed for pain, Valium as needed for muscle spasms  5/13- pain controlled with no meds- con't regimen 5/15- taking pain meds 2-3x/day at most and no valium lately- valium likely caused her confusion prior. 5/16- will d/c Valium- pt didn't get pain meds after 11:50 pm last night- so think she needs to take pain meds more frequently- I think that's why she hurt so much-  5/17- educated pt when gets up to 10/10, needs to take pain meds q4 hours for a few times to get back under control- she voiced understanding.   5/23- pain well controlled on current regimen- taking 2-3x/day usually- will need at d/c for 7 days, then f/u with me x1 4. Mood/Behavior/Sleep: Trazodone 150 mg nightly.  Provide emotional support  5/13- refused trazodone, will make prn  5/16- will add Melatonin 5 mg QHS for poor sleep- 5/17- slept better but no pain meds overnight, so hurting this AM              -antipsychotic agents: N/A 5. Neuropsych/cognition: This patient is capable of making decisions on her own behalf. 6. Skin/Wound Care: Routine skin checks 7. Fluids/Electrolytes/Nutrition: Routine in and outs with follow-up chemistries 8.  COPD/quit smoking 4 years ago.  Continue albuterol as directed 9.  Hyperlipidemia.  Pravachol 10.  Hx of Hypertension with orthostatic hypotension.  Continue Lisinopril 5 mg daily, Lasix 20 mg daily         --Blood pressure soft 80s  over 60s on admission; appears to have been intermittently hypotensive throughout her stay.  Receiving Lasix approximately every other day.  Will DC for now, start daily weights. 5/14-  will d/w therapy if affecting therapy- pt c/o light headedness when sits up- wearing TEDs- will stop Lisinopril and explained to pt to drink 6-8 cups/day.  5/15- will add Midodrine 8am and noon daily 5 mg- since dropped into 80s systolic with standing again this AM- of note, weight down to 51.5 kg. 5/16- Wgt 53.5 kg- which is more towards her baseline- also appears to have less drop in BP yesterday with midodrine- down to 110s vs 70s. Con't regimen  5/17- BP running supine 130s-150s- however drops when sits up- not dry on BMP yesterday  5/18- 125 to 160s- systolic- still has orthostatic hypotension, so will not be more aggressive with BP.  5/21--5/22 BP running low 100s to 140s- doing somewhat better- no dizziness in therapy- con't regimen  5/24- con't current regimen so doesn't drop BP too much    08/05/2022    5:55 AM 08/05/2022    5:00 AM 08/04/2022    8:19 PM  Vitals with BMI  Weight  122 lbs 9 oz   BMI  21.03   Systolic 105  123  Diastolic 42  74  Pulse 72  96    11.  Diabetes mellitus.  Latest hemoglobin A1c 9.3.  Currently SSI, continue  5.13- restarted Amaryl- wasn't taking at home.   514- Bgs looking better- 141 to 188 since started Amaryl- much better  5/15- CBGs 100-175- doing much better- con't regimen  5/17- CBGs dropped to 60s 2 separate times yesterday- will decrease SSI to sensitive from moderate- to be less aggressive.   5/18- CBGs 1-1=198= due to decreased SSI- will keep an eye on her CBGs and titrate as required/monitor trend. 5/19-  CBGs 159-230- will try increasing Amaryl slightly to 2 mg daily- had drops in Cbgs with mod SSI, but hopefully will not as much with increase in Amaryl  5/20 well controlled today, continue regimen 5/24- CBGs looking stalbe- con't Amaryl at home CBG (last 3)   Recent Labs    08/04/22 1634 08/04/22 2223 08/05/22 0553  GLUCAP 175* 136* 178*    12.  Hypothyroidism. Continue Synthroid 13.  Constipation. Continue Colace 100 mg daily, Dulcolax suppository daily as needed, MiraLAX daily as needed  5.13- sorbitol 45cc- if no results, wrote for Soap suds enema.   5/14- very large BM last night after sorbitol- didn't need enema  5/16- LBM 3 days ago- will add Miralax as well as Sorbitol 30cc x1  5/17- will give 45 cc Sorbitol and if no BM, give soap suds enema  5/18- had multiple BM's- had suppository- refused enema. 5/19- will add Senna since LBM 2 days ago- and taking pain meds, so miralax probably not enough- 2 tabs daily  5/20 LBM yesterday, improved 5/21- doesn't remember LBM (was 5/19) but reports constipation- will give sorbitol and soap suds if need be.  5/22- huge BM yesterday after sorbitol- con't regimen 5/23- will give sorbitol 1 more time, so doesn't go 3 days without a BM 5/24- LBM yesterday with sorbitol- advised to take Miralax and Senna at home to empty bowels  14. Low grade fever  5/14- T-100 degrees this AM- felt dizzy, but sounds like not fevrish Sx's- got tylenol- feels better- U/A and CXR yesterday negative- could be atelectasis- so added flutter valve. Dopplers also negative- asked pt to drink 6-8 cups/day of water  5/15- T 99.7 this AM, but not feeling badly. Will check labs again in AM  5/16- no elevated temp  5/18- no more low grade temps/resolved 15. Indigestion, improved  -Pepcid PRN ordered 16. Dry skin-   5/22 will order Eucerin BID for dry skin on arms and legs  I spent a total of 34   minutes on total care today- >50% coordination of care- due to education on bowels and pain meds and f/u-     LOS: 14 days A FACE TO FACE EVALUATION WAS PERFORMED  Christapher Gillian 08/05/2022, 8:17 AM

## 2022-08-05 NOTE — Progress Notes (Signed)
Recreational Therapy Discharge Summary Patient Details  Name: Alexandria Webster MRN: [id number] Date of Birth: 1965/07/18 Today's Date: 08/05/2022  Comments on progress toward goals: Pt has made good progress during LOS and is ready for discharge home today with family to provide the needed supervision/assistance.  TR sessions focused on pt education in regards to activity analysis/modifications, stress management,discharge planning and active participation in animal assisted therapy and community reintegration.  Pt required contact guard assist for outdoor mobility using RW, frequent rest breaks for fatigue.  Pt is anxious to discharge and return to previously enjoyed activities.  Reasons for discharge: discharge from hospital  Follow-up: Outpatient  Patient/family agrees with progress made and goals achieved: Yes  [name] 08/05/2022, 12:10 PM

## 2022-08-09 ENCOUNTER — Telehealth: Payer: Self-pay | Admitting: *Deleted

## 2022-08-09 NOTE — Telephone Encounter (Signed)
Transition Care Management Unsuccessful Follow-up Telephone Call  Date of discharge and from where:  08/05/22  Attempts:  2nd Attempt  Reason for unsuccessful TCM follow-up call:  Left voice message

## 2022-08-09 NOTE — Telephone Encounter (Signed)
Transition Care Management Unsuccessful Follow-up Telephone Call  Date of discharge and from where:  08/05/22  Attempts:  1st Attempt  Reason for unsuccessful TCM follow-up call:  Left voice message

## 2022-08-11 ENCOUNTER — Ambulatory Visit: Payer: 59 | Admitting: Gastroenterology

## 2022-08-11 ENCOUNTER — Other Ambulatory Visit: Payer: Self-pay

## 2022-08-11 NOTE — Telephone Encounter (Signed)
Refill request

## 2022-08-12 ENCOUNTER — Telehealth: Payer: Self-pay | Admitting: *Deleted

## 2022-08-12 ENCOUNTER — Telehealth: Payer: Self-pay

## 2022-08-12 MED ORDER — HYDROCODONE-ACETAMINOPHEN 5-325 MG PO TABS: 1.0000 | ORAL_TABLET | Freq: Four times a day (QID) | ORAL | 0 refills | Status: DC | PRN

## 2022-08-12 NOTE — Telephone Encounter (Signed)
Hydrocodone 5/325 MG approved 08/12/2022 to 02/2023.  Approval code [id number].

## 2022-08-12 NOTE — Telephone Encounter (Signed)
PA for Hydrocodone 5-325 MG  created on 08/12/2022.

## 2022-08-12 NOTE — Telephone Encounter (Signed)
Pts daughter lvm requesting refill on Hydrocodone.   TC call needs to be completed. Called and lvm for a call back.

## 2022-08-15 ENCOUNTER — Encounter: Payer: Self-pay | Admitting: Physical Medicine and Rehabilitation

## 2022-08-15 ENCOUNTER — Encounter: Payer: 59 | Attending: Physical Medicine and Rehabilitation | Admitting: Physical Medicine and Rehabilitation

## 2022-08-15 VITALS — BP 131/79 | HR 85 | Ht 64.0 in | Wt 122.0 lb

## 2022-08-15 DIAGNOSIS — G8918 Other acute postprocedural pain: Secondary | ICD-10-CM | POA: Diagnosis not present

## 2022-08-15 DIAGNOSIS — I951 Orthostatic hypotension: Secondary | ICD-10-CM | POA: Diagnosis not present

## 2022-08-15 DIAGNOSIS — Z5181 Encounter for therapeutic drug level monitoring: Secondary | ICD-10-CM | POA: Insufficient documentation

## 2022-08-15 DIAGNOSIS — G894 Chronic pain syndrome: Secondary | ICD-10-CM | POA: Diagnosis not present

## 2022-08-15 DIAGNOSIS — R2689 Other abnormalities of gait and mobility: Secondary | ICD-10-CM | POA: Insufficient documentation

## 2022-08-15 DIAGNOSIS — Z79899 Other long term (current) drug therapy: Secondary | ICD-10-CM | POA: Insufficient documentation

## 2022-08-15 DIAGNOSIS — G959 Disease of spinal cord, unspecified: Secondary | ICD-10-CM | POA: Insufficient documentation

## 2022-08-15 MED ORDER — MIDODRINE HCL 5 MG PO TABS: 5.0000 mg | ORAL_TABLET | Freq: Two times a day (BID) | ORAL | 5 refills | Status: DC

## 2022-08-15 MED ORDER — TRAZODONE HCL 150 MG PO TABS: 150.0000 mg | ORAL_TABLET | Freq: Every evening | ORAL | 5 refills | Status: DC | PRN

## 2022-08-15 NOTE — Addendum Note (Signed)
Addended by: Becky Sax on: 08/15/2022 10:15 AM   Modules accepted: Orders

## 2022-08-15 NOTE — Addendum Note (Signed)
Addended by: Becky Sax on: 08/15/2022 09:40 AM   Modules accepted: Orders

## 2022-08-15 NOTE — Patient Instructions (Addendum)
Pt is a 57 yr old female with hx of cervical myelopathy s/p C5-T1 decompression lamintomy and PCDF 07/19/22 by Dr Jordan Likes.  She also has hx of post op pain; COPD, hx of HTN with orthostatic hypotension; DM; Hypothyroidism, and acut eon chronic constipation;  Here for transitional care/hospital f/u on cervical myelopathy.   Might have an infection going on- that's causing sweating- not every night- if it's menopause, can change meds to help. I like Black Cohosh -over the counter.   2. Goal to wean off pain meds- but sent in 1 month's supply- 4 pills/day on Friday- Norco 5 mg 4x/day as needed- can refill in 1 month, but will likely continue to wean dosing- goal to not be on long term.    3. Will refer to [facility] for PT and OT- for cervical myelopathy- [phone]- can call to get appointments for PT and OT  4. BP is doing OK- Con't Midodrine 5 mg BID- at breakfast and lunch- sent in 5 refills  5. Con't Trazodone 150 mg nightly- for sleep as needed- #30- 5 refills.   6. Has appt with PCP- tomorrow- don't restart BP meds right now- might be able to restart later on down the road.   7. Will need to have PCP do Amaryl 2 mg daily- since BG's were through the roof in rehab.   8. F?U in 3 months- on cervical myelopathy  9. UDS and opiate contract-   10. Start walking with RW outside more, so can regain some of strength and also can pedal bike.

## 2022-08-15 NOTE — Progress Notes (Signed)
Subjective:    Patient ID: Alexandria Webster, female    DOB: 12-12-1965, 57 y.o.   MRN: [id number]  HPI  Pt is a 57 yr old female with hx of cervical myelopathy s/p C5-T1 decompression lamintomy and PCDF 07/19/22 by Dr Jordan Likes.  She also has hx of post op pain; COPD, hx of HTN with orthostatic hypotension; DM; Hypothyroidism, and acut eon chronic constipation;  Here for transitional care/hospital f/u on cervical myelopathy.   Tried to wean pain meds to BID-  Moving around a little more Riding in a car about "kills" her  Pain is worse- when first gets up in AM- not giving pain meds time ot take effect, so when starts to move, really hurts.   Last got pain meds Friday. I sent in for 4 pils/day.    Having HA's- started having HA's- Saturday-  started before neck surgery- but last HA Saturday.   Went to appt for eye- they don't take her insurance- and call [facility]- to see if anyone can take her insurance.  Blurry vision and getting worse.  Hinders when walks, so hard to walk.    Sent to outpt PT- first appt 2nd week in July.    At night time- when goes to bed- soaking wet- is sweating really bad  Clothes were soaked.  Not occurs every night- but will completely soak her from hair to toes-  Sees PCP tomorrow-   L hand goes numb- and cannot open all the way.   Not walking much at home- walks with RW- very little walking- goes to bathroom,  Not a lot of room in home to get around.  Sees Dr Jordan Likes sometime in June-  6/21 3:45 pm.  When left rehab, walking 200 ft or more with supervision.    Doesn't feel dizzy/lightheaded, but has to sit up for awhile, before can stand up.    Pain Inventory Average Pain 9 Pain Right Now 8 My pain is constant, sharp, and burning  In the last 24 hours, has pain interfered with the following? General activity 1 Relation with others 4 Enjoyment of life 0 What TIME of day is your pain at its worst? morning  and night Sleep (in general)  Fair  Pain is worse with: walking, sitting, and standing Pain improves with: rest and medication Relief from Meds: 5  use a walker how many minutes can you walk? 0 ability to climb steps?  yes do you drive?  no use a wheelchair  not employed: date last employed 2009 Needs assistance with dressing bathing toileting meal prep household duties shopping loss of taste or smell bowel control problems trouble walking     Hospital follow up    Family History  Problem Relation Age of Onset   Stroke Mother    Hypertension Mother    Heart failure Father    Asthma Father    Diabetes Father    Heart disease Father    Emphysema Paternal Grandfather    Colon cancer Neg Hx    Colon polyps Neg Hx    Social History   Socioeconomic History   Marital status: Single    Spouse name: Not on file   Number of children: 2   Years of education: Not on file   Highest education level: Not on file  Occupational History   Not on file  Tobacco Use   Smoking status: Former    Packs/day: 0.00    Years: 36.00    Additional  pack years: 0.00    Total pack years: 0.00    Types: Cigarettes    Quit date: 03/14/2018    Years since quitting: 4.4    Passive exposure: Past   Smokeless tobacco: Current    Types: Snuff  Vaping Use   Vaping Use: Never used  Substance and Sexual Activity   Alcohol use: No   Drug use: No   Sexual activity: Yes    Birth control/protection: Surgical    Comment: tubal  Other Topics Concern   Not on file  Social History Narrative   Not on file   Social Determinants of Health   Financial Resource Strain: Not on file  Food Insecurity: Not on file  Transportation Needs: Not on file  Physical Activity: Not on file  Stress: Not on file  Social Connections: Not on file   Past Surgical History:  Procedure Laterality Date   AORTA - BILATERAL FEMORAL ARTERY BYPASS GRAFT N/A 09/27/2018   Procedure: Redo Exposure  AORTOBIFEMORAL Bypass and bilateral femoral Artery ;  Redo Aortobifemoral  BYPASS GRAFT.;  Surgeon: Cephus Shelling, MD;  Location: [facility] OR;  Service: Vascular;  Laterality: N/A;   BACK SURGERY     BIOPSY  10/14/2021   Procedure: BIOPSY;  Surgeon: Lanelle Bal, DO;  [facility];  Service: Endoscopy;;   COLONOSCOPY WITH PROPOFOL N/A 10/14/2021   Procedure: COLONOSCOPY WITH PROPOFOL;  Surgeon: Lanelle Bal, DO;  [facility];  Service: Endoscopy;  Laterality: N/A;  8:30am   ESOPHAGOGASTRODUODENOSCOPY (EGD) WITH PROPOFOL N/A 10/14/2021   Procedure: ESOPHAGOGASTRODUODENOSCOPY (EGD) WITH PROPOFOL;  Surgeon: Lanelle Bal, DO;  Location: [facility];  Service: Endoscopy;  Laterality: N/A;   FEMORAL ARTERY STENT  right leg   POSTERIOR CERVICAL FUSION/FORAMINOTOMY N/A 07/19/2022   Procedure: Posterior cervical fusion with lateral mass fixation - Cervical four-cervical five, Cervical five-Cervical six - Cervical six-Cervical seven - Cervical seven-Thoracic one with laminectomy;  Surgeon: Julio Sicks, MD;  Location: [facility] OR;  Service: Neurosurgery;  Laterality: N/A;   TUBAL LIGATION     Past Medical History:  Diagnosis Date   Asthma    COPD (chronic obstructive pulmonary disease) (HCC)    DDD (degenerative disc disease), lumbar    Depression    Diabetes mellitus    Gastroparesis    GERD without esophagitis    High cholesterol    Hypertension    Hypothyroidism    PONV (postoperative nausea and vomiting)    PVD (peripheral vascular disease) (HCC)    Wears glasses    BP 131/79   Pulse 85   Ht 5\' 4"  (1.626 m)   Wt 122 lb (55.3 kg)   LMP 01/21/2012   SpO2 99%   BMI 20.94 kg/m   Opioid Risk Score:   Fall Risk Score:  `1  Depression screen [facility] 2/9     05/09/2016   11:34 AM 10/14/2015    1:42 PM 09/28/2015    3:08 PM 09/14/2015   10:08 AM  Depression screen PHQ 2/9  Decreased Interest 0 0 0 0  Down, Depressed, Hopeless 0 0 0 0  PHQ - 2 Score 0 0 0 0      Review of Systems  Musculoskeletal:  Positive  for neck pain.       B/L shoulders  All other systems reviewed and are negative.      Objective:   Physical Exam  Awake, alert, appropriate, head flexed forward some- wearing soft collar; in transport  w/c; NAD Accompanied by grandson and daughter  MS: LUE- Biceps 4/5; Triceps 4+/5; WE 4-/5; grip 3+/5; and FA 2/5 RUE- biceps 4+/5; triceps 4-/5; WE 4+/5; grip 4/5; FA 4-/5 LLE- HF 4-/5; KE 4+/5; DF and PF 4+/5 RLE- HF 4/5; KE 4+/5; DF and PF 4+/5  Neuro: DTRs 3+ in Ue's and 3-4+ in B/L knees No hoffman's B/L ; no clonus B/L  No increased tone (doesn't describe any muscle spasms)       Assessment & Plan:   Pt is a 57 yr old female with hx of cervical myelopathy s/p C5-T1 decompression lamintomy and PCDF 07/19/22 by Dr Jordan Likes.  She also has hx of post op pain; COPD, hx of HTN with orthostatic hypotension; DM; Hypothyroidism, and acut eon chronic constipation;  Here for transitional care/hospital f/u on cervical myelopathy.   Might have an infection going on- that's causing sweating- not every night- if it's menopause, can change meds to help. I like Black Cohosh -over the counter.   2. Goal to wean off pain meds- but sent in 1 month's supply- 4 pills/day on Friday- Norco 5 mg 4x/day as needed- can refill in 1 month, but will likely continue to wean dosing- goal to not be on long term.    3. Will refer to [facility] for PT and OT- for cervical myelopathy- [phone]- can call to get appointments for PT and OT  4. BP is doing OK- Con't Midodrine 5 mg BID- at breakfast and lunch- sent in 5 refills  5. Con't Trazodone 150 mg nightly- for sleep as needed- #30- 5 refills.   6. Has appt with PCP- tomorrow- don't restart BP meds right now- might be able to restart later on down the road.   7. Will need to have PCP do Amaryl 2 mg daily- since BG's were through the roof in rehab.   8. F/U in 3 months- on cervical myelopathy  9. UDS and opiate contract- to make sure can continue to  write for meds-   10. Start walking with RW outside more, so can regain some of strength and also can pedal bike.    I spent a total of  34  minutes on total care today- >50% coordination of care- due to d/w pt about exercise, pain, function and sweating as well as orthostatic hypotension.

## 2022-08-16 ENCOUNTER — Telehealth: Payer: Self-pay

## 2022-08-16 DIAGNOSIS — E1100 Type 2 diabetes mellitus with hyperosmolarity without nonketotic hyperglycemic-hyperosmolar coma (NKHHC): Secondary | ICD-10-CM | POA: Diagnosis not present

## 2022-08-16 DIAGNOSIS — Z6824 Body mass index (BMI) 24.0-24.9, adult: Secondary | ICD-10-CM | POA: Diagnosis not present

## 2022-08-16 DIAGNOSIS — M5030 Other cervical disc degeneration, unspecified cervical region: Secondary | ICD-10-CM | POA: Diagnosis not present

## 2022-08-16 LAB — HEMOGLOBIN A1C: Hemoglobin A1C: 7.3

## 2022-08-16 NOTE — Telephone Encounter (Signed)
PA for Hydrocodone/APAP approved 08/12/22-02/12/23

## 2022-08-19 LAB — DRUG TOX MONITOR 1 W/CONF, ORAL FLD
Amphetamines: NEGATIVE ng/mL (ref <10)
Barbiturates: NEGATIVE ng/mL (ref <10)
Benzodiazepines: NEGATIVE ng/mL (ref <0.50)
Buprenorphine: NEGATIVE ng/mL (ref <0.10)
Cocaine: NEGATIVE ng/mL (ref <5.0)
Codeine: NEGATIVE ng/mL (ref <2.5)
Cotinine: 21.3 ng/mL — ABNORMAL HIGH (ref <5.0)
Dihydrocodeine: NEGATIVE ng/mL (ref <2.5)
Fentanyl: NEGATIVE ng/mL (ref <0.10)
Heroin Metabolite: NEGATIVE ng/mL (ref <1.0)
Hydrocodone: 9.4 ng/mL — ABNORMAL HIGH (ref <2.5)
Hydromorphone: NEGATIVE ng/mL (ref <2.5)
MARIJUANA: NEGATIVE ng/mL (ref <2.5)
MDMA: NEGATIVE ng/mL (ref <10)
Meprobamate: NEGATIVE ng/mL (ref <2.5)
Methadone: NEGATIVE ng/mL (ref <5.0)
Morphine: NEGATIVE ng/mL (ref <2.5)
Nicotine Metabolite: POSITIVE ng/mL — AB (ref <5.0)
Norhydrocodone: NEGATIVE ng/mL (ref <2.5)
Noroxycodone: NEGATIVE ng/mL (ref <2.5)
Opiates: POSITIVE ng/mL — AB (ref <2.5)
Oxycodone: NEGATIVE ng/mL (ref <2.5)
Oxymorphone: NEGATIVE ng/mL (ref <2.5)
Phencyclidine: NEGATIVE ng/mL (ref <10)
Tapentadol: NEGATIVE ng/mL (ref <5.0)
Tramadol: NEGATIVE ng/mL (ref <5.0)
Zolpidem: NEGATIVE ng/mL (ref <5.0)

## 2022-08-19 LAB — DRUG TOX ALC METAB W/CON, ORAL FLD: Alcohol Metabolite: NEGATIVE ng/mL (ref <25)

## 2022-08-22 DIAGNOSIS — H47391 Other disorders of optic disc, right eye: Secondary | ICD-10-CM | POA: Diagnosis not present

## 2022-08-22 DIAGNOSIS — H35033 Hypertensive retinopathy, bilateral: Secondary | ICD-10-CM | POA: Diagnosis not present

## 2022-08-22 DIAGNOSIS — H25813 Combined forms of age-related cataract, bilateral: Secondary | ICD-10-CM | POA: Diagnosis not present

## 2022-08-22 DIAGNOSIS — H3562 Retinal hemorrhage, left eye: Secondary | ICD-10-CM | POA: Diagnosis not present

## 2022-08-22 DIAGNOSIS — H3582 Retinal ischemia: Secondary | ICD-10-CM | POA: Diagnosis not present

## 2022-08-22 DIAGNOSIS — H4312 Vitreous hemorrhage, left eye: Secondary | ICD-10-CM | POA: Diagnosis not present

## 2022-08-22 DIAGNOSIS — E113522 Type 2 diabetes mellitus with proliferative diabetic retinopathy with traction retinal detachment involving the macula, left eye: Secondary | ICD-10-CM | POA: Diagnosis not present

## 2022-08-22 DIAGNOSIS — E113511 Type 2 diabetes mellitus with proliferative diabetic retinopathy with macular edema, right eye: Secondary | ICD-10-CM | POA: Diagnosis not present

## 2022-09-02 ENCOUNTER — Encounter (HOSPITAL_COMMUNITY): Payer: Self-pay | Admitting: Ophthalmology

## 2022-09-02 NOTE — Anesthesia Preprocedure Evaluation (Signed)
Anesthesia Evaluation  Patient identified by MRN, date of birth, ID band Patient awake    Reviewed: Allergy & Precautions, H&P , NPO status , Patient's Chart, lab work & pertinent test results  History of Anesthesia Complications (+) PONV and history of anesthetic complications  Airway Mallampati: II   Neck ROM: full    Dental   Pulmonary asthma , COPD, former smoker   breath sounds clear to auscultation       Cardiovascular hypertension, + Peripheral Vascular Disease   Rhythm:regular Rate:Normal     Neuro/Psych  PSYCHIATRIC DISORDERS  Depression       GI/Hepatic ,GERD  ,,  Endo/Other  diabetes, Type 2Hypothyroidism    Renal/GU      Musculoskeletal  (+) Arthritis ,    Abdominal   Peds  Hematology   Anesthesia Other Findings   Reproductive/Obstetrics                             Anesthesia Physical Anesthesia Plan  ASA: 3  Anesthesia Plan: MAC   Post-op Pain Management:    Induction: Intravenous  PONV Risk Score and Plan: 3 and Propofol infusion and Treatment may vary due to age or medical condition  Airway Management Planned: Nasal Cannula  Additional Equipment:   Intra-op Plan:   Post-operative Plan:   Informed Consent: I have reviewed the patients History and Physical, chart, labs and discussed the procedure including the risks, benefits and alternatives for the proposed anesthesia with the patient or authorized representative who has indicated his/her understanding and acceptance.     Dental advisory given  Plan Discussed with: CRNA, Anesthesiologist and Surgeon  Anesthesia Plan Comments: (PAT note written 09/02/2022 by Shonna Chock, PA-C.  )       Anesthesia Quick Evaluation

## 2022-09-02 NOTE — Progress Notes (Signed)
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 She is s/p C5-7 laminectomy, C4-T1 posterolateral arthrodesis 07/19/22 for cervical myelopathy. She was discharged to CIR for on-going rehab from 07/22/22-08/05/22. Of note prior to having her cervical spine surgery, she was referred to ophthalmology for left eye visual changes. Post-operative follow-up with a retinal specialist arranged. She is now scheduled for repair of complex left retinal detachment.    Other history includes former smoker (quit 03/14/18), post-operative N/V, HTN, DM2, PVD (PTA infrarenal aorta 02/27/02; AFBG 07/28/06; redo AFBG 09/27/18 ), COPD, asthma, hypothyroidism, GERD, gastroparesis, spinal surgery (Left L4-5 microdiscectomy 07/08/04; C5-7 laminectomy, C4-T1 posterolateral arthrodesis 07/19/22).   A1c 9.3% On 07/14/22. She is on Amaryl, Trulicity. Last Trulicity 08/27/22. She was instructed to hold until after her surgery.   Anesthesia team to evaluate on the day of surgery.    VS: LMP 01/21/2012  BP Readings from Last 3 Encounters:  08/15/22 131/79  08/05/22 (!) 105/42  07/22/22 (!) 111/59   Pulse Readings from Last 3 Encounters:  08/15/22 85  08/05/22 72  07/22/22 84     PROVIDERS: Assunta Found, MD is PCP  Gretta Began, MD is vascular surgeon Julio Sicks, MD is neurosurgeon   LABS: Most recent results in [facility] include: Lab Results  Component Value Date   WBC 6.0 07/28/2022   HGB 10.1 (L) 07/28/2022   HCT 30.4 (L) 07/28/2022   PLT 390 07/28/2022   GLUCOSE 166 (H) 07/28/2022   CHOL  146 08/07/2021   TRIG 120 08/07/2021   HDL 44 08/07/2021   LDLCALC 78 08/07/2021   ALT 8 07/25/2022   AST 11 (L) 07/25/2022   NA 137 07/28/2022   K 4.4 07/28/2022   CL 104 07/28/2022   CREATININE 1.02 (H) 07/28/2022   BUN 17 07/28/2022   CO2 26 07/28/2022   TSH 1.411 06/20/2022   INR 1.1 06/20/2022   HGBA1C 9.3 (H) 07/14/2022     IMAGES: 1V CXR 07/25/22: FINDINGS: No consolidation, pneumothorax or effusion. Slight linear opacity of the left lung base likely scar or atelectasis. Normal cardiopericardial silhouette. Calcified aorta. Degenerative changes of the spine. Osteopenia. Fixation hardware seen along the cervical spine at the edge of the imaging field. IMPRESSION: Minimal left basilar scar or atelectasis. No consolidation or effusion.  MRI Brain 06/20/22: IMPRESSION: 1. No acute intracranial abnormality. 2. Age-related cerebral atrophy with mild chronic small vessel ischemic disease, with a few small remote lacunar infarcts about the thalami and cerebellum.  MRI C-spine 06/20/22: IMPRESSION: 1. Multilevel cervical spondylosis with resultant diffuse spinal stenosis, severe in nature at C5-6 and C6-7. Patchy cord signal changes at these levels consistent with myelomalacia. Neurosurgical/spine surgery consultation recommended. 2. Question additional subtle cord signal abnormality at the level of C3-4, also suspicious for myelomalacia. 3. Multifactorial degenerative changes with resultant multilevel foraminal narrowing as above. Notable findings include severe bilateral C4 foraminal stenosis, moderate left with severe right C5 foraminal narrowing, severe bilateral C6 and C7 foraminal stenosis, with moderate left C8 foraminal narrowing.  CTA Neck 08/07/21: IMPRESSION: 1. Age-indeterminate right  vertebral artery occlusion just distal to its origin and weakly reconstitutes at the C3 level. This could be due to atherosclerosis or dissection. 2. Left vertebral artery is  patent with moderate to severe stenosis due to mass effect from adjacent osteophytes at C5-C6. 3. Bilateral carotid bifurcation atherosclerosis with approximately 50% left proximal ICA stenosis. 4. Approximately 50% stenosis of the right brachiocephalic origin. 5. 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 Recommend MRI of the cervical spine with contrast to further evaluate and to also exclude a mass. 6. Please see same day maxillofacial CT for evaluation of findings in the face.   EKG: EKG 07/14/22: Sinus rhythm Multiple ventricular premature complexes Low voltage, extremity and precordial leads since last tracing no significant change Confirmed by Rolan Bucco [phone]) on 07/15/2022 9:41:25 AM   CV: US Carotid 08/07/21: IMPRESSION: 1. 50-69% stenosis of the left internal carotid artery. 2. Less than 50% stenosis of the right internal carotid artery. 3. Interval development of retrograde flow in right vertebral artery. 4. Further evaluation with CT angiography of the neck should be considered tissue min the cause of new retrograde right vertebral artery flow.     Echo 08/06/21: IMPRESSIONS   1. Left ventricular ejection fraction, by estimation, is 65 to 70%. Left  ventricular ejection fraction by 2D MOD biplane is 68.5 %. The left  ventricle has normal function. The left ventricle has no regional wall  motion abnormalities. Left ventricular  diastolic parameters are consistent with Grade I diastolic dysfunction  (impaired relaxation).   2. Right ventricular systolic function is normal. The right ventricular  size is normal. Tricuspid regurgitation signal is inadequate for assessing   PA pressure.   3. The mitral valve is grossly normal. No evidence of mitral valve  regurgitation.   4. The aortic valve is tricuspid. Aortic valve regurgitation is not  visualized.   5. The inferior vena cava is normal in size with greater than 50%  respiratory variability, suggesting right atrial pressure of 3 mmHg.  - Comparison(s): Changes from prior study are noted. 09/06/2018: LVEF 60-65%, grade 1 DD.    Past Medical History:  Diagnosis Date   Asthma    COPD (chronic obstructive pulmonary disease) (HCC)    DDD (degenerative disc disease), lumbar    Depression    Diabetes mellitus    Gastroparesis    GERD without esophagitis    High cholesterol    Hypertension    Hypothyroidism    PONV (postoperative nausea and vomiting)    PVD (peripheral vascular disease) (HCC)    Wears glasses     Past Surgical History:  Procedure Laterality Date   AORTA - BILATERAL FEMORAL ARTERY BYPASS GRAFT N/A 09/27/2018   Procedure: Redo Exposure  AORTOBIFEMORAL Bypass and bilateral femoral Artery ; Redo Aortobifemoral  BYPASS GRAFT.;  Surgeon: Cephus Shelling, MD;  Location: [facility] OR;  Service: Vascular;  Laterality: N/A;   BACK SURGERY     BIOPSY  10/14/2021   Procedure: BIOPSY;  Surgeon: Lanelle Bal, DO;  [facility];  Service: Endoscopy;;   COLONOSCOPY WITH PROPOFOL N/A 10/14/2021   Procedure: COLONOSCOPY WITH PROPOFOL;  Surgeon: Lanelle Bal, DO;  [facility];  Service: Endoscopy;  Laterality: N/A;  8:30am   ESOPHAGOGASTRODUODENOSCOPY (  EGD) WITH PROPOFOL N/A 10/14/2021   Procedure: ESOPHAGOGASTRODUODENOSCOPY (EGD) WITH PROPOFOL;  Surgeon: Lanelle Bal, DO;  [facility];  Service: Endoscopy;  Laterality: N/A;   FEMORAL ARTERY STENT  right leg   POSTERIOR CERVICAL FUSION/FORAMINOTOMY N/A 07/19/2022   Procedure: Posterior cervical fusion with lateral mass fixation - Cervical four-cervical five, Cervical five-Cervical six - Cervical six-Cervical  seven - Cervical seven-Thoracic one with laminectomy;  Surgeon: Julio Sicks, MD;  Location: [facility] OR;  Service: Neurosurgery;  Laterality: N/A;   TUBAL LIGATION      MEDICATIONS: No current facility-administered medications for this encounter.    acetaminophen (TYLENOL) 325 MG tablet   albuterol (PROVENTIL) 2 MG tablet   albuterol (VENTOLIN HFA) 108 (90 Base) MCG/ACT inhaler   aspirin 325 MG tablet   cetirizine (ZYRTEC) 10 MG tablet   docusate sodium (COLACE) 100 MG capsule   Dulaglutide (TRULICITY) 1.5 MG/0.5ML SOPN   esomeprazole (NEXIUM) 40 MG capsule   glimepiride (AMARYL) 2 MG tablet   HYDROcodone-acetaminophen (NORCO/VICODIN) 5-325 MG tablet   levothyroxine (SYNTHROID) 50 MCG tablet   lovastatin (MEVACOR) 20 MG tablet   meclizine (ANTIVERT) 25 MG tablet   midodrine (PROAMATINE) 5 MG tablet   Oxymetazoline HCl (MUCINEX NASAL SPRAY FULL FORCE NA)   Phenylephrine-DM-GG-APAP (MUCINEX SINUS-MAX) 5-10-200-325 MG TABS   polyethylene glycol powder (MIRALAX) 17 GM/SCOOP powder   traZODone (DESYREL) 150 MG tablet    Shonna Chock, PA-C Surgical Short Stay/Anesthesiology [facility] Phone [phone] [facility] Phone [phone] 09/02/2022 12:35 PM

## 2022-09-02 NOTE — Progress Notes (Signed)
SDW call  Patient was given pre-op instructions over the phone. Patient verbalized understanding of instructions provided.    PCP - Dr. Assunta Found Neurologist: Dr. Julio Sicks Cardiologist - denies Pulmonary: denies   PPM/ICD - denies  Chest x-ray - 07/25/2022 EKG -  07/15/2022 Stress Test - ECHO - 08/01/2021 Cardiac Cath -   Sleep Study/sleep apnea/CPAP: Denies  Type II diabetic Fasting Blood sugar range: 90-103 How often check sugars: daily Trulicity, states last dose 08/27/2022 Glimerpride (Amaryl), instructed to hold day of surgery   Blood Thinner Instructions: denies Aspirin Instructions: states last dose 08/02/2022   ERAS Protcol - Yes, clear fluids until 1300 PRE-SURGERY Ensure or G2-    COVID TEST- n/a    Anesthesia review: Yes.  DM, HTN, PVD, COPD   Patient denies shortness of breath, fever, cough and chest pain over the phone call  Your procedure is scheduled on Tuesday September 06, 2022  Report to [facility] Main Entrance "A" at 1330 PM then check in with the Admitting office.  Call this number if you have problems the morning of surgery:  [phone]   If you have any questions prior to your surgery date call [phone]: Open Monday-Friday 8am-4pm If you experience any cold or flu symptoms such as cough, fever, chills, shortness of breath, etc. between now and your scheduled surgery, please notify us at the above number    Remember:  Do not eat after midnight the night before your surgery  You may drink clear liquids until 1300 the day of your surgery.   Clear liquids allowed are: Water, Non-Citrus Juices (without pulp), Carbonated Beverages, Clear Tea, Black Coffee ONLY (NO MILK, CREAM OR POWDERED CREAMER of any kind), and Gatorade   Take these medicines the morning of surgery with A SIP OF WATER:  Albuterol, nexium, norco, levothyroxine, meclizine, midodrine  As needed: Tylenol,   As of today, STOP taking any Aspirin (unless otherwise instructed by  your surgeon) Aleve, Naproxen, Ibuprofen, Motrin, Advil, Goody's, BC's, all herbal medications, fish oil, and all vitamins.

## 2022-09-05 ENCOUNTER — Ambulatory Visit: Payer: 59 | Admitting: Gastroenterology

## 2022-09-05 NOTE — Progress Notes (Deleted)
GI Office Note    Referring Provider: Assunta Found, MD Primary Care Physician:  Assunta Found, MD Primary Gastroenterologist: Hennie Duos. Marletta Lor, DO  Date:  09/05/2022  ID:  Alexandria Webster, DOB 23-Jul-1965, MRN [id number]   Chief Complaint   No chief complaint on file.  History of Present Illness  Alexandria Webster is a 57 y.o. female with a history of asthma, COPD, diabetes, gastroparesis, GERD, HTN, hypothyroidism, and PVD presenting today for follow-up.  Initial consultation 09/09/21. Reported constipation controlled with miralax and colace with 1-2 bowel movements daily. Primary complaint was nausea and vomiting. Having bilious emesis. Emesis is usually post prandial and at the time of her visit it had improved to 1-2 times per week and felt like it was related to metformin. Less of appetite with trulicity. Was taking nexium once daily. Reportedly was diagnosed with gastroparesis by PCP. Never had a colonoscopy and was due for CRC screening. Scheduled for TCS and EGD.   EGD 10/14/21: - small hiatal hernia - normal esophagus - gastritis s/p biopsy (normal mucosa) - normal duodenum - Advised PPI daily.    Colonoscopy 10/14/21: - Normal - Repeat in 10 years   Last seen 01/20/2022.  Improved GERD with Nexium twice daily.  Nausea vomiting significantly improved since starting Nexium twice daily.  Did report dairy does bother her.  Still having appetite and weight loss but this is likely secondary to Trulicity.  Constipation fairly well-controlled with Colace daily and MiraLAX as needed.  Denied any straining.  Bowel movement at least every day if not twice a day.  Nexium prescription updated, encouraged high-protein diet and protein supplementation and to monitor weight.  Follow-up in 6 months.  She underwent posterior cervical decompression and fusion from C4-T1 on 07/19/2022 and was discharged to inpatient rehab postop.  She did experience some constipation which was relieved with laxative  assistance.  She was discharged from rehab with opioids, Nexium twice daily, and advised MiraLAX daily for constipation.   Today:    Current Outpatient Medications  Medication Sig Dispense Refill   acetaminophen (TYLENOL) 325 MG tablet Take 2 tablets (650 mg total) by mouth every 4 (four) hours as needed for mild pain ((score 1 to 3) or temp > 100.5).     albuterol (PROVENTIL) 2 MG tablet Take 1 tablet (2 mg total) by mouth daily. 30 tablet 0   albuterol (VENTOLIN HFA) 108 (90 Base) MCG/ACT inhaler Inhale 1-2 puffs into the lungs every 4 (four) hours as needed for wheezing or shortness of breath. 8 g 0   aspirin 325 MG tablet Take 325 mg by mouth in the morning.     cetirizine (ZYRTEC) 10 MG tablet Take 10 mg by mouth at bedtime.     docusate sodium (COLACE) 100 MG capsule Take 100-200 mg by mouth daily as needed (constipation.).     Dulaglutide (TRULICITY) 1.5 MG/0.5ML SOPN Inject 1.5 mg into the skin once a week. (Patient taking differently: Inject 1.5 mg into the skin every Saturday.) 0.5 mL 0   esomeprazole (NEXIUM) 40 MG capsule Take 1 capsule (40 mg total) by mouth 2 (two) times daily before a meal. 60 capsule 0   glimepiride (AMARYL) 2 MG tablet Take 1 tablet (2 mg total) by mouth daily with breakfast. 30 tablet 0   HYDROcodone-acetaminophen (NORCO/VICODIN) 5-325 MG tablet Take 1 tablet by mouth every 6 (six) hours as needed for moderate pain. Trying to wean dose- max 4x/day (Patient taking differently: Take 1 tablet by mouth  in the morning and at bedtime. Trying to wean dose- max 4x/day) 120 tablet 0   levothyroxine (SYNTHROID) 50 MCG tablet Take 1 tablet (50 mcg total) by mouth daily before breakfast. 30 tablet 0   lovastatin (MEVACOR) 20 MG tablet Take 1 tablet (20 mg total) by mouth at bedtime. (Patient taking differently: Take 20 mg by mouth in the morning.) 30 tablet 0   meclizine (ANTIVERT) 25 MG tablet Take 1 tablet (25 mg total) by mouth 3 (three) times daily as needed for  dizziness. (Patient taking differently: Take 25 mg by mouth See admin instructions. Take 1 tablet (25 mg) by mouth scheduled every morning, and may repeat dose (25 mg) by mouth up to twice daily if needed for dizziness/vertigo.) 30 tablet 0   midodrine (PROAMATINE) 5 MG tablet Take 1 tablet (5 mg total) by mouth 2 (two) times daily with breakfast and lunch. 60 tablet 5   Oxymetazoline HCl (MUCINEX NASAL SPRAY FULL FORCE NA) Place 1 spray into the nose in the morning and at bedtime. Mucinex Sinus Max Nasal Spray Clear & Cool     Phenylephrine-DM-GG-APAP (MUCINEX SINUS-MAX) 5-10-200-325 MG TABS Take 1 tablet by mouth in the morning and at bedtime.     polyethylene glycol powder (MIRALAX) 17 GM/SCOOP powder Take 17 g by mouth daily as needed for moderate constipation.     traZODone (DESYREL) 150 MG tablet Take 1 tablet (150 mg total) by mouth at bedtime as needed for sleep. (Patient taking differently: Take 150 mg by mouth at bedtime.) 30 tablet 5   No current facility-administered medications for this visit.    Past Medical History:  Diagnosis Date   Asthma    COPD (chronic obstructive pulmonary disease) (HCC)    DDD (degenerative disc disease), lumbar    Depression    Diabetes mellitus    Gastroparesis    GERD without esophagitis    High cholesterol    Hypertension    Hypothyroidism    PONV (postoperative nausea and vomiting)    PVD (peripheral vascular disease) (HCC)    Wears glasses     Past Surgical History:  Procedure Laterality Date   AORTA - BILATERAL FEMORAL ARTERY BYPASS GRAFT N/A 09/27/2018   Procedure: Redo Exposure  AORTOBIFEMORAL Bypass and bilateral femoral Artery ; Redo Aortobifemoral  BYPASS GRAFT.;  Surgeon: Cephus Shelling, MD;  Location: [facility] OR;  Service: Vascular;  Laterality: N/A;   BACK SURGERY     BIOPSY  10/14/2021   Procedure: BIOPSY;  Surgeon: Lanelle Bal, DO;  Location: [facility];  Service: Endoscopy;;   COLONOSCOPY WITH PROPOFOL N/A 10/14/2021    Procedure: COLONOSCOPY WITH PROPOFOL;  Surgeon: Lanelle Bal, DO;  Location: [facility];  Service: Endoscopy;  Laterality: N/A;  8:30am   ESOPHAGOGASTRODUODENOSCOPY (EGD) WITH PROPOFOL N/A 10/14/2021   Procedure: ESOPHAGOGASTRODUODENOSCOPY (EGD) WITH PROPOFOL;  Surgeon: Lanelle Bal, DO;  Location: [facility];  Service: Endoscopy;  Laterality: N/A;   FEMORAL ARTERY STENT  right leg   POSTERIOR CERVICAL FUSION/FORAMINOTOMY N/A 07/19/2022   Procedure: Posterior cervical fusion with lateral mass fixation - Cervical four-cervical five, Cervical five-Cervical six - Cervical six-Cervical seven - Cervical seven-Thoracic one with laminectomy;  Surgeon: Julio Sicks, MD;  Location: [facility] OR;  Service: Neurosurgery;  Laterality: N/A;   TUBAL LIGATION      Family History  Problem Relation Age of Onset   Stroke Mother    Hypertension Mother    Heart failure Father    Asthma  Father    Diabetes Father    Heart disease Father    Emphysema Paternal Grandfather    Colon cancer Neg Hx    Colon polyps Neg Hx     Allergies as of 09/05/2022 - Review Complete 09/02/2022  Allergen Reaction Noted   Metformin and related Nausea And Vomiting 07/14/2022    Social History   Socioeconomic History   Marital status: Single    Spouse name: Not on file   Number of children: 2   Years of education: Not on file   Highest education level: Not on file  Occupational History   Not on file  Tobacco Use   Smoking status: Former    Packs/day: 0.00    Years: 36.00    Additional pack years: 0.00    Total pack years: 0.00    Types: Cigarettes    Quit date: 03/14/2018    Years since quitting: 4.4    Passive exposure: Past   Smokeless tobacco: Current    Types: Snuff  Vaping Use   Vaping Use: Never used  Substance and Sexual Activity   Alcohol use: No   Drug use: No   Sexual activity: Yes    Birth control/protection: Surgical    Comment: tubal  Other Topics Concern   Not on file  Social  History Narrative   Not on file   Social Determinants of Health   Financial Resource Strain: Not on file  Food Insecurity: Not on file  Transportation Needs: Not on file  Physical Activity: Not on file  Stress: Not on file  Social Connections: Not on file     Review of Systems   Gen: Denies fever, chills, anorexia. Denies fatigue, weakness, weight loss.  CV: Denies chest pain, palpitations, syncope, peripheral edema, and claudication. Resp: Denies dyspnea at rest, cough, wheezing, coughing up blood, and pleurisy. GI: See HPI Derm: Denies rash, itching, dry skin Psych: Denies depression, anxiety, memory loss, confusion. No homicidal or suicidal ideation.  Heme: Denies bruising, bleeding, and enlarged lymph nodes.   Physical Exam   LMP 01/21/2012   General:   Alert and oriented. No distress noted. Pleasant and cooperative.  Head:  Normocephalic and atraumatic. Eyes:  Conjuctiva clear without scleral icterus. Mouth:  Oral mucosa pink and moist. Good dentition. No lesions. Lungs:  Clear to auscultation bilaterally. No wheezes, rales, or rhonchi. No distress.  Heart:  S1, S2 present without murmurs appreciated.  Abdomen:  +BS, soft, non-tender and non-distended. No rebound or guarding. No HSM or masses noted. Rectal: *** Msk:  Symmetrical without gross deformities. Normal posture. Extremities:  Without edema. Neurologic:  Alert and  oriented x4 Psych:  Alert and cooperative. Normal mood and affect.   Assessment  Alexandria Webster is a 57 y.o. female with a history of asthma, COPD, diabetes, gastroparesis, GERD, HTN, hypothyroidism, and PVD presenting today for follow-up.  Constipation:   GERD, N/V:   Weight loss:   PLAN   ***     Brooke Bonito, MSN, FNP-BC, AGACNP-BC [facility]

## 2022-09-06 ENCOUNTER — Encounter (HOSPITAL_COMMUNITY): Payer: Self-pay | Admitting: Ophthalmology

## 2022-09-06 ENCOUNTER — Ambulatory Visit (HOSPITAL_BASED_OUTPATIENT_CLINIC_OR_DEPARTMENT_OTHER): Payer: 59 | Admitting: Vascular Surgery

## 2022-09-06 ENCOUNTER — Ambulatory Visit (HOSPITAL_COMMUNITY): Payer: 59 | Admitting: Vascular Surgery

## 2022-09-06 ENCOUNTER — Encounter (HOSPITAL_COMMUNITY)
Admission: RE | Disposition: A | Discharge status: Home / Self Care | Payer: Self-pay | Source: Home / Self Care | Attending: Ophthalmology

## 2022-09-06 ENCOUNTER — Ambulatory Visit (HOSPITAL_COMMUNITY)
Admission: RE | Admit: 2022-09-06 | Discharge: 2022-09-06 | Disposition: A | Discharge status: Home / Self Care | Payer: 59 | Attending: Ophthalmology | Admitting: Ophthalmology

## 2022-09-06 ENCOUNTER — Other Ambulatory Visit: Payer: Self-pay

## 2022-09-06 DIAGNOSIS — E113522 Type 2 diabetes mellitus with proliferative diabetic retinopathy with traction retinal detachment involving the macula, left eye: Secondary | ICD-10-CM

## 2022-09-06 DIAGNOSIS — I10 Essential (primary) hypertension: Secondary | ICD-10-CM

## 2022-09-06 DIAGNOSIS — K219 Gastro-esophageal reflux disease without esophagitis: Secondary | ICD-10-CM | POA: Insufficient documentation

## 2022-09-06 DIAGNOSIS — M47812 Spondylosis without myelopathy or radiculopathy, cervical region: Secondary | ICD-10-CM | POA: Insufficient documentation

## 2022-09-06 DIAGNOSIS — E1143 Type 2 diabetes mellitus with diabetic autonomic (poly)neuropathy: Secondary | ICD-10-CM | POA: Insufficient documentation

## 2022-09-06 DIAGNOSIS — Z87891 Personal history of nicotine dependence: Secondary | ICD-10-CM

## 2022-09-06 DIAGNOSIS — M4802 Spinal stenosis, cervical region: Secondary | ICD-10-CM | POA: Insufficient documentation

## 2022-09-06 DIAGNOSIS — K3184 Gastroparesis: Secondary | ICD-10-CM | POA: Diagnosis not present

## 2022-09-06 DIAGNOSIS — E1151 Type 2 diabetes mellitus with diabetic peripheral angiopathy without gangrene: Secondary | ICD-10-CM | POA: Diagnosis not present

## 2022-09-06 DIAGNOSIS — F32A Depression, unspecified: Secondary | ICD-10-CM | POA: Insufficient documentation

## 2022-09-06 DIAGNOSIS — E113531 Type 2 diabetes mellitus with proliferative diabetic retinopathy with traction retinal detachment not involving the macula, right eye: Secondary | ICD-10-CM | POA: Diagnosis not present

## 2022-09-06 DIAGNOSIS — E7800 Pure hypercholesterolemia, unspecified: Secondary | ICD-10-CM | POA: Diagnosis not present

## 2022-09-06 DIAGNOSIS — J449 Chronic obstructive pulmonary disease, unspecified: Secondary | ICD-10-CM | POA: Insufficient documentation

## 2022-09-06 DIAGNOSIS — E039 Hypothyroidism, unspecified: Secondary | ICD-10-CM | POA: Diagnosis not present

## 2022-09-06 HISTORY — PX: REPAIR OF COMPLEX TRACTION RETINAL DETACHMENT: SHX6217

## 2022-09-06 HISTORY — PX: MEMBRANE PEEL: SHX5967

## 2022-09-06 HISTORY — PX: INJECTION OF SILICONE OIL: SHX6422

## 2022-09-06 HISTORY — PX: PARS PLANA VITRECTOMY: SHX2166

## 2022-09-06 HISTORY — PX: PHOTOCOAGULATION WITH LASER: SHX6027

## 2022-09-06 LAB — BASIC METABOLIC PANEL
Anion gap: 11 (ref 5–15)
BUN: 14 mg/dL (ref 6–20)
CO2: 21 mmol/L — ABNORMAL LOW (ref 22–32)
Calcium: 9.3 mg/dL (ref 8.9–10.3)
Chloride: 105 mmol/L (ref 98–111)
Creatinine, Ser: 0.75 mg/dL (ref 0.44–1.00)
GFR, Estimated: >60 mL/min (ref >60)
Glucose, Bld: 99 mg/dL (ref 70–99)
Potassium: 3.4 mmol/L — ABNORMAL LOW (ref 3.5–5.1)
Sodium: 137 mmol/L (ref 135–145)

## 2022-09-06 LAB — GLUCOSE, CAPILLARY: Glucose-Capillary: 98 mg/dL (ref 70–99)

## 2022-09-06 LAB — CBC
HCT: 36.8 % (ref 36.0–46.0)
Hemoglobin: 12.5 g/dL (ref 12.0–15.0)
MCH: 33.6 pg (ref 26.0–34.0)
MCHC: 34 g/dL (ref 30.0–36.0)
MCV: 98.9 fL (ref 80.0–100.0)
Platelets: 178 10*3/uL (ref 150–400)
RBC: 3.72 MIL/uL — ABNORMAL LOW (ref 3.87–5.11)
RDW: 13 % (ref 11.5–15.5)
WBC: 6.2 10*3/uL (ref 4.0–10.5)
nRBC: 0 % (ref 0.0–0.2)

## 2022-09-06 SURGERY — REPAIR, RETINAL DETACHMENT, COMPLEX
Anesthesia: Monitor Anesthesia Care | Site: Eye | Laterality: Left

## 2022-09-06 MED ORDER — EPINEPHRINE PF 1 MG/ML IJ SOLN
INTRAMUSCULAR | Status: AC
  Filled 2022-09-06: qty 1

## 2022-09-06 MED ORDER — HYALURONIDASE HUMAN 150 UNIT/ML IJ SOLN
INTRAMUSCULAR | Status: AC
  Filled 2022-09-06: qty 1

## 2022-09-06 MED ORDER — CHLORHEXIDINE GLUCONATE 0.12 % MT SOLN: 15.0000 mL | OROMUCOSAL | Status: AC

## 2022-09-06 MED ORDER — PROPOFOL 10 MG/ML IV BOLUS
INTRAVENOUS | Status: DC | PRN
  Administered 2022-09-06: 10 mg via INTRAVENOUS
  Administered 2022-09-06: 20 mg via INTRAVENOUS

## 2022-09-06 MED ORDER — DEXAMETHASONE SODIUM PHOSPHATE 10 MG/ML IJ SOLN
INTRAMUSCULAR | Status: AC
  Filled 2022-09-06: qty 1

## 2022-09-06 MED ORDER — ONDANSETRON HCL 4 MG/2ML IJ SOLN
INTRAMUSCULAR | Status: AC
  Filled 2022-09-06: qty 2

## 2022-09-06 MED ORDER — DEXAMETHASONE SODIUM PHOSPHATE 10 MG/ML IJ SOLN
INTRAMUSCULAR | Status: DC | PRN
  Administered 2022-09-06: 10 mg

## 2022-09-06 MED ORDER — PROPARACAINE HCL 0.5 % OP SOLN
OPHTHALMIC | Status: AC
  Administered 2022-09-06: 1 [drp] via OPHTHALMIC
  Filled 2022-09-06: qty 15

## 2022-09-06 MED ORDER — TOBRAMYCIN-DEXAMETHASONE 0.3-0.1 % OP OINT
TOPICAL_OINTMENT | OPHTHALMIC | Status: AC
  Filled 2022-09-06: qty 3.5

## 2022-09-06 MED ORDER — CEFAZOLIN SUBCONJUNCTIVAL INJECTION 100 MG/0.5 ML
100.0000 mg | INJECTION | SUBCONJUNCTIVAL | Status: AC
  Administered 2022-09-06: 100 mg via SUBCONJUNCTIVAL
  Filled 2022-09-06: qty 1

## 2022-09-06 MED ORDER — TOBRAMYCIN-DEXAMETHASONE 0.3-0.1 % OP OINT
TOPICAL_OINTMENT | OPHTHALMIC | Status: DC | PRN
  Administered 2022-09-06: 1 via OPHTHALMIC

## 2022-09-06 MED ORDER — PHENYLEPHRINE HCL 2.5 % OP SOLN
1.0000 [drp] | OPHTHALMIC | Status: AC
  Administered 2022-09-06 (×2): 1 [drp] via OPHTHALMIC

## 2022-09-06 MED ORDER — OFLOXACIN 0.3 % OP SOLN: 1.0000 [drp] | Freq: Once | OPHTHALMIC | Status: AC

## 2022-09-06 MED ORDER — ORAL CARE MOUTH RINSE: 15.0000 mL | Freq: Once | OROMUCOSAL | Status: DC

## 2022-09-06 MED ORDER — FENTANYL CITRATE (PF) 250 MCG/5ML IJ SOLN
INTRAMUSCULAR | Status: AC
  Filled 2022-09-06: qty 5

## 2022-09-06 MED ORDER — LIDOCAINE HCL 2 % IJ SOLN
INTRAMUSCULAR | Status: AC
  Filled 2022-09-06: qty 20

## 2022-09-06 MED ORDER — LACTATED RINGERS IV SOLN: INTRAVENOUS | Status: DC | PRN

## 2022-09-06 MED ORDER — MIDAZOLAM HCL 2 MG/2ML IJ SOLN
INTRAMUSCULAR | Status: DC | PRN
  Administered 2022-09-06 (×2): 1 mg via INTRAVENOUS

## 2022-09-06 MED ORDER — SODIUM CHLORIDE 0.9 % IV SOLN: INTRAVENOUS | Status: DC

## 2022-09-06 MED ORDER — LIDOCAINE HCL 2 % IJ SOLN
INTRAMUSCULAR | Status: DC | PRN
  Administered 2022-09-06: 6 mL via RETROBULBAR

## 2022-09-06 MED ORDER — CHLORHEXIDINE GLUCONATE 0.12 % MT SOLN
OROMUCOSAL | Status: AC
  Administered 2022-09-06: 15 mL via OROMUCOSAL
  Filled 2022-09-06: qty 15

## 2022-09-06 MED ORDER — MIDAZOLAM HCL 2 MG/2ML IJ SOLN
INTRAMUSCULAR | Status: AC
  Filled 2022-09-06: qty 2

## 2022-09-06 MED ORDER — ATROPINE SULFATE 1 % OP SOLN
OPHTHALMIC | Status: AC
  Filled 2022-09-06: qty 5

## 2022-09-06 MED ORDER — OFLOXACIN 0.3 % OP SOLN
OPHTHALMIC | Status: AC
  Administered 2022-09-06: 1 [drp] via OPHTHALMIC
  Filled 2022-09-06: qty 5

## 2022-09-06 MED ORDER — BSS PLUS IO SOLN
INTRAOCULAR | Status: AC
  Filled 2022-09-06: qty 500

## 2022-09-06 MED ORDER — SODIUM HYALURONATE 10 MG/ML IO SOLUTION
PREFILLED_SYRINGE | INTRAOCULAR | Status: AC
  Filled 2022-09-06: qty 0.85

## 2022-09-06 MED ORDER — OXYCODONE HCL 5 MG PO TABS: 5.0000 mg | ORAL_TABLET | Freq: Once | ORAL | Status: DC | PRN

## 2022-09-06 MED ORDER — BSS IO SOLN
INTRAOCULAR | Status: AC
  Filled 2022-09-06: qty 15

## 2022-09-06 MED ORDER — FENTANYL CITRATE (PF) 100 MCG/2ML IJ SOLN: 25.0000 ug | INTRAMUSCULAR | Status: DC | PRN

## 2022-09-06 MED ORDER — ONDANSETRON HCL 4 MG/2ML IJ SOLN: 4.0000 mg | Freq: Four times a day (QID) | INTRAMUSCULAR | Status: DC | PRN

## 2022-09-06 MED ORDER — SODIUM HYALURONATE 10 MG/ML IO SOLUTION
PREFILLED_SYRINGE | INTRAOCULAR | Status: DC | PRN
  Administered 2022-09-06: .85 mL via INTRAOCULAR

## 2022-09-06 MED ORDER — TROPICAMIDE 1 % OP SOLN
1.0000 [drp] | OPHTHALMIC | Status: AC
  Administered 2022-09-06 (×2): 1 [drp] via OPHTHALMIC

## 2022-09-06 MED ORDER — ONDANSETRON HCL 4 MG/2ML IJ SOLN
INTRAMUSCULAR | Status: DC | PRN
  Administered 2022-09-06: 4 mg via INTRAVENOUS

## 2022-09-06 MED ORDER — FENTANYL CITRATE (PF) 250 MCG/5ML IJ SOLN
INTRAMUSCULAR | Status: DC | PRN
  Administered 2022-09-06: 25 ug via INTRAVENOUS

## 2022-09-06 MED ORDER — PROPARACAINE HCL 0.5 % OP SOLN: 1.0000 [drp] | Freq: Once | OPHTHALMIC | Status: AC

## 2022-09-06 MED ORDER — BUPIVACAINE HCL (PF) 0.75 % IJ SOLN
INTRAMUSCULAR | Status: AC
  Filled 2022-09-06: qty 10

## 2022-09-06 MED ORDER — CHLORHEXIDINE GLUCONATE 0.12 % MT SOLN: 15.0000 mL | Freq: Once | OROMUCOSAL | Status: DC

## 2022-09-06 MED ORDER — PHENYLEPHRINE HCL 2.5 % OP SOLN
OPHTHALMIC | Status: AC
  Administered 2022-09-06: 1 [drp] via OPHTHALMIC
  Filled 2022-09-06: qty 2

## 2022-09-06 MED ORDER — TROPICAMIDE 1 % OP SOLN
OPHTHALMIC | Status: AC
  Administered 2022-09-06: 1 [drp] via OPHTHALMIC
  Filled 2022-09-06: qty 15

## 2022-09-06 MED ORDER — EPINEPHRINE PF 1 MG/ML IJ SOLN
INTRAOCULAR | Status: DC | PRN
  Administered 2022-09-06: 500.3 mL

## 2022-09-06 MED ORDER — OXYCODONE HCL 5 MG/5ML PO SOLN: 5.0000 mg | Freq: Once | ORAL | Status: DC | PRN

## 2022-09-06 SURGICAL SUPPLY — 46 items
APL SWBSTK 6 STRL LF DISP (MISCELLANEOUS) ×1
APPLICATOR COTTON TIP 6 STRL (MISCELLANEOUS) ×2 IMPLANT
APPLICATOR COTTON TIP 6IN STRL (MISCELLANEOUS) ×1
BNDG EYE OVAL 2 1/8 X 2 5/8 (GAUZE/BANDAGES/DRESSINGS) IMPLANT
CABLE BIPOLOR RESECTION CORD (MISCELLANEOUS) IMPLANT
CANNULA VLV SOFT TIP 25G (OPHTHALMIC) ×2 IMPLANT
CANNULA VLV SOFT TIP 25GA (OPHTHALMIC) ×1 IMPLANT
CHANDELIER CONSTEL 25G RFID (OPHTHALMIC) IMPLANT
CLSR STERI-STRIP ANTIMIC 1/2X4 (GAUZE/BANDAGES/DRESSINGS) ×2 IMPLANT
DRAPE HALF SHEET 40X57 (DRAPES) ×2 IMPLANT
DRAPE INCISE 51X51 W/FILM STRL (DRAPES) IMPLANT
DRAPE RETRACTOR (MISCELLANEOUS) ×2 IMPLANT
FORCEPS GRIESHABER ILM 25G A (INSTRUMENTS) IMPLANT
GLOVE SURG SYN 7.5 E (GLOVE) ×1 IMPLANT
GLOVE SURG SYN 7.5 PF PI (GLOVE) ×2 IMPLANT
GOWN STRL REUS W/ TWL LRG LVL3 (GOWN DISPOSABLE) ×4 IMPLANT
GOWN STRL REUS W/TWL LRG LVL3 (GOWN DISPOSABLE) ×2
KIT BASIN OR (CUSTOM PROCEDURE TRAY) ×2 IMPLANT
KIT TURNOVER KIT B (KITS) ×2 IMPLANT
LENS BIOM SUPER VIEW SET DISP (MISCELLANEOUS) ×2 IMPLANT
NDL 18GX1X1/2 (RX/OR ONLY) (NEEDLE) ×2 IMPLANT
NDL 25GX 5/8IN NON SAFETY (NEEDLE) ×2 IMPLANT
NDL 27GX1/2 REG BEVEL ECLIP (NEEDLE) ×2 IMPLANT
NDL FILTER BLUNT 18X1 1/2 (NEEDLE) ×2 IMPLANT
NDL HYPO 30X.5 LL (NEEDLE) ×4 IMPLANT
NDL RETROBULBAR 25GX1.5 (NEEDLE) ×2 IMPLANT
NEEDLE 18GX1X1/2 (RX/OR ONLY) (NEEDLE) ×1 IMPLANT
NEEDLE 25GX 5/8IN NON SAFETY (NEEDLE) ×1 IMPLANT
NEEDLE 27GX1/2 REG BEVEL ECLIP (NEEDLE) ×1 IMPLANT
NEEDLE FILTER BLUNT 18X1 1/2 (NEEDLE) ×1 IMPLANT
NEEDLE HYPO 30X.5 LL (NEEDLE) ×2 IMPLANT
NEEDLE RETROBULBAR 25GX1.5 (NEEDLE) ×1 IMPLANT
OIL SILICONE OPHTHALMIC 1000 (Ophthalmic Related) IMPLANT
PACK VITRECTOMY CUSTOM (CUSTOM PROCEDURE TRAY) ×2 IMPLANT
PAD ARMBOARD 7.5X6 YLW CONV (MISCELLANEOUS) ×4 IMPLANT
PAK PIK VITRECTOMY CVS 25GA (OPHTHALMIC) ×2 IMPLANT
PIC ILLUMINATED 25G (OPHTHALMIC) ×1
PIK ILLUMINATED 25G (OPHTHALMIC) IMPLANT
PROBE ENDO DIATHERMY 25G (MISCELLANEOUS) ×2 IMPLANT
PROBE LASER ILLUM FLEX CVD 25G (OPHTHALMIC) IMPLANT
SCISSORS TIP GREISHABER 23 VER (OPHTHALMIC) IMPLANT
SET INJECTOR OIL FLUID CONSTEL (OPHTHALMIC) IMPLANT
SHIELD EYE LENSE ONLY DISP (GAUZE/BANDAGES/DRESSINGS) IMPLANT
SOL ANTI FOG 6CC (MISCELLANEOUS) ×2 IMPLANT
SYR TB 1ML LUER SLIP (SYRINGE) ×4 IMPLANT
WATER STERILE IRR 1000ML POUR (IV SOLUTION) ×2 IMPLANT

## 2022-09-06 NOTE — H&P (Signed)
Date of examination:  09/06/22  Indication for surgery: tractional retinal detachment left eye  Pertinent past medical history:  Past Medical History:  Diagnosis Date   Asthma    COPD (chronic obstructive pulmonary disease) (HCC)    DDD (degenerative disc disease), lumbar    Depression    Diabetes mellitus    Gastroparesis    GERD without esophagitis    High cholesterol    Hypertension    Hypothyroidism    PONV (postoperative nausea and vomiting)    PVD (peripheral vascular disease) (HCC)    Wears glasses     Pertinent ocular history:  proliferative diabetic retinopathy  Pertinent family history:  Family History  Problem Relation Age of Onset   Stroke Mother    Hypertension Mother    Heart failure Father    Asthma Father    Diabetes Father    Heart disease Father    Emphysema Paternal Grandfather    Colon cancer Neg Hx    Colon polyps Neg Hx     General:  Healthy appearing patient in no distress.     Eyes:    Acuity OS HM  External: Within normal limits      Anterior segment: Within normal limits           Fundus: Tractional retinal detachment left eye with vitreous hemorrhage    Impression: Tractional retinal detachment left eye with vitreous hemorrhage  Plan:  Tractional retinal detachment repair left eye   Carmela Rima, MD

## 2022-09-06 NOTE — Brief Op Note (Signed)
09/06/2022  6:03 PM  PATIENT:  Alexandria Webster  57 y.o. 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  PROCEDURE:  Procedure(s) with comments: REPAIR OF COMPLEX TRACTION RETINAL DETACHMENT (Left) - MAC WITH BLOCK PARS PLANA VITRECTOMY WITH 25 GAUGE (Left) MEMBRANECTOMY (Left) PHOTOCOAGULATION WITH LASER (Left) INJECTION OF SILICONE OIL (Left)  SURGEON:  Surgeon(s) and Role:    * Carmela Rima, MD - Primary  PHYSICIAN ASSISTANT:   ASSISTANTS: none   ANESTHESIA:   local and MAC  EBL:  minimal   BLOOD ADMINISTERED:none  DRAINS: none   LOCAL MEDICATIONS USED:  MARCAINE    and LIDOCAINE   SPECIMEN:  No Specimen  DISPOSITION OF SPECIMEN:  N/A  COUNTS:  YES  TOURNIQUET:  * No tourniquets in log *  DICTATION: .Note written in EPIC  PLAN OF CARE: Discharge to home after PACU  PATIENT DISPOSITION:  PACU - hemodynamically stable.   Delay start of Pharmacological VTE agent (>24hrs) due to surgical blood loss or risk of bleeding: not applicable

## 2022-09-06 NOTE — Discharge Instructions (Addendum)
   SLEEP ON SIDE WITH NOSE TO PILLOW  DURING DAY KEEP UPRIGHT

## 2022-09-06 NOTE — Transfer of Care (Signed)
Immediate Anesthesia Transfer of Care Note  Patient: Alexandria Webster  Procedure(s) Performed: REPAIR OF COMPLEX TRACTION RETINAL DETACHMENT (Left: Eye) PARS PLANA VITRECTOMY WITH 25 GAUGE (Left: Eye) MEMBRANECTOMY (Left: Eye) PHOTOCOAGULATION WITH LASER (Left: Eye) INJECTION OF SILICONE OIL (Left: Eye)  Patient Location: PACU  Anesthesia Type:MAC  Level of Consciousness: awake, alert , and oriented  Airway & Oxygen Therapy: Patient Spontanous Breathing  Post-op Assessment: Report given to RN and Post -op Vital signs reviewed and stable  Post vital signs: Reviewed and stable  Last Vitals:  Vitals Value Taken Time  BP 122/50 09/06/22 1807  Temp 98   Pulse 83 09/06/22 1810  Resp 16 09/06/22 1810  SpO2 96 % 09/06/22 1810  Vitals shown include unvalidated device data.  Last Pain:  Vitals:   09/06/22 1417  TempSrc:   PainSc: 0-No pain         Complications: No notable events documented.

## 2022-09-07 ENCOUNTER — Telehealth: Payer: Self-pay | Admitting: *Deleted

## 2022-09-07 ENCOUNTER — Encounter (HOSPITAL_COMMUNITY): Payer: Self-pay | Admitting: Ophthalmology

## 2022-09-07 ENCOUNTER — Other Ambulatory Visit: Payer: Self-pay | Admitting: Family Medicine

## 2022-09-07 DIAGNOSIS — H4311 Vitreous hemorrhage, right eye: Secondary | ICD-10-CM | POA: Diagnosis not present

## 2022-09-07 DIAGNOSIS — E113521 Type 2 diabetes mellitus with proliferative diabetic retinopathy with traction retinal detachment involving the macula, right eye: Secondary | ICD-10-CM | POA: Diagnosis not present

## 2022-09-07 DIAGNOSIS — R0989 Other specified symptoms and signs involving the circulatory and respiratory systems: Secondary | ICD-10-CM

## 2022-09-07 MED ORDER — HYDROCODONE-ACETAMINOPHEN 5-325 MG PO TABS: 1.0000 | ORAL_TABLET | Freq: Four times a day (QID) | ORAL | 0 refills | Status: DC | PRN

## 2022-09-07 NOTE — Telephone Encounter (Signed)
Mrs Raatz's daughter called to report they will be needing a refill on her hydrocodone. She has about 7 days left and following instructions not to wait until out to call.

## 2022-09-07 NOTE — Anesthesia Postprocedure Evaluation (Signed)
Anesthesia Post Note  Patient: Alexandria Webster  Procedure(s) Performed: REPAIR OF COMPLEX TRACTION RETINAL DETACHMENT (Left: Eye) PARS PLANA VITRECTOMY WITH 25 GAUGE (Left: Eye) MEMBRANECTOMY (Left: Eye) PHOTOCOAGULATION WITH LASER (Left: Eye) INJECTION OF SILICONE OIL (Left: Eye)     Patient location during evaluation: PACU Anesthesia Type: MAC Level of consciousness: awake and alert Pain management: pain level controlled Vital Signs Assessment: post-procedure vital signs reviewed and stable Respiratory status: spontaneous breathing, nonlabored ventilation, respiratory function stable and patient connected to nasal cannula oxygen Cardiovascular status: stable and blood pressure returned to baseline Postop Assessment: no apparent nausea or vomiting Anesthetic complications: no   No notable events documented.  Last Vitals:  Vitals:   09/06/22 1815 09/06/22 1830  BP: (!) 95/52 104/69  Pulse: 82 82  Resp: 14 13  Temp:  36.5 C  SpO2: 96% 97%    Last Pain:  Vitals:   09/06/22 1830  TempSrc:   PainSc: 0-No pain                 Trevor Iha

## 2022-09-12 DIAGNOSIS — Z419 Encounter for procedure for purposes other than remedying health state, unspecified: Secondary | ICD-10-CM | POA: Diagnosis not present

## 2022-09-18 NOTE — Op Note (Signed)
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  A lid speculum was placed.  Infusion line and trocar was placed at the 8 o'clock position approximately 3.5 mm from the surgical limbus.   The infusion line was allowed to run and then clamped when placed at the cannula opening. The line was inserted and secured to the drape with an adhesive strip.   Active trocars/cannula were placed at the 10 and 2 o'clock positions approximately 3.5 mm from the surgical limbus. The cannula was visualized in the vitreous cavity.  The light pipe and vitreous cutter were inserted into the vitreous cavity and a core vitrectomy was performed.  Care taken to remove the vitreous up to the vitreous base for 360 degrees.   Attention was directed toward relieving the tractional detachment from the posterior pole in particular surrounding the arcades both temporally and nasally. This was done carefully at the disc and surrounding arcades. There was notable neovascular fronds with traction and associated hemorrhage inferiorly. Care was taken to elevate the membranes and remove them both with a vitrector, lighted pick, ILM forceps as well as chandelier light source.  Hemostasis of the neovascular fronds was performed with endocautery and endolaser. Following hemostasis, continued dissection of membranes and removal of membranes was performed including the temporal and inferior retina. Endolaser was applied to the areas where the neovascular fronds  were still present.  3 rows of endolaser were applied 360 degrees to the periphery.  After membranectomy, A complete air-fluid exchange was then performed and thick subretinal fluid was drained.  Additional endolaser was applied.  1000 centistoke silicone oil was placed into the eye. The trocars were sequentially removed and all were noted to be sealed. Subconjunctival injections of Ancef and Decadron were placed.   The speculum and drapes were removed and the eye was patched with Polymixin/Bacitracin ophthalmic ointment. An eye shield was placed and the patient was transferred alert and conversant with stable vital signs to the post operative recovery area.  The patient tolerated the procedure well and no complications were noted.   Harrold Donath MD

## 2022-09-19 DIAGNOSIS — R809 Proteinuria, unspecified: Secondary | ICD-10-CM | POA: Diagnosis not present

## 2022-09-19 DIAGNOSIS — N183 Chronic kidney disease, stage 3 unspecified: Secondary | ICD-10-CM | POA: Diagnosis not present

## 2022-09-19 DIAGNOSIS — D631 Anemia in chronic kidney disease: Secondary | ICD-10-CM | POA: Diagnosis not present

## 2022-09-21 DIAGNOSIS — E1129 Type 2 diabetes mellitus with other diabetic kidney complication: Secondary | ICD-10-CM | POA: Diagnosis not present

## 2022-09-21 DIAGNOSIS — R809 Proteinuria, unspecified: Secondary | ICD-10-CM | POA: Diagnosis not present

## 2022-09-21 DIAGNOSIS — E1122 Type 2 diabetes mellitus with diabetic chronic kidney disease: Secondary | ICD-10-CM | POA: Diagnosis not present

## 2022-09-21 DIAGNOSIS — N1831 Chronic kidney disease, stage 3a: Secondary | ICD-10-CM | POA: Diagnosis not present

## 2022-09-22 ENCOUNTER — Encounter (HOSPITAL_COMMUNITY): Payer: 59 | Admitting: Occupational Therapy

## 2022-09-22 ENCOUNTER — Ambulatory Visit (HOSPITAL_COMMUNITY): Payer: 59

## 2022-09-23 DIAGNOSIS — M4802 Spinal stenosis, cervical region: Secondary | ICD-10-CM | POA: Diagnosis not present

## 2022-09-27 ENCOUNTER — Other Ambulatory Visit: Payer: 59

## 2022-10-04 ENCOUNTER — Ambulatory Visit
Admission: RE | Admit: 2022-10-04 | Discharge: 2022-10-04 | Disposition: A | Discharge status: Home / Self Care | Payer: 59 | Source: Ambulatory Visit | Attending: Family Medicine | Admitting: Family Medicine

## 2022-10-04 DIAGNOSIS — I6523 Occlusion and stenosis of bilateral carotid arteries: Secondary | ICD-10-CM | POA: Diagnosis not present

## 2022-10-04 DIAGNOSIS — R0989 Other specified symptoms and signs involving the circulatory and respiratory systems: Secondary | ICD-10-CM

## 2022-10-08 NOTE — Progress Notes (Signed)
GI Office Note    Referring Provider: Assunta Found, MD Primary Care Physician:  Assunta Found, MD Primary Gastroenterologist: Hennie Duos. Marletta Lor, DO  Date:  10/10/2022  ID:  Maricela Curet, DOB 11/02/1965, MRN [id number]   Chief Complaint   Chief Complaint  Patient presents with   Gastroesophageal Reflux    Follow up on GERD. States pharmacy is only giving her #30 per 30 days even though rx is wrote for bid. Working well when taking twice daily.    Constipation    Follow up on constipation. Takes one scoop miralax daily. States miralax is not working well. Has to take mineral oil to have BM.    History of Present Illness  Alexandria Webster is a 57 y.o. female with a history of asthma, COPD, diabetes, gastroparesis, GERD, HTN, hypothyroidism, and PVD presenting today for follow-up.   Initial consultation 09/09/21. Reported constipation controlled with miralax and colace with 1-2 bowel movements daily. Primary complaint was nausea and vomiting. Having bilious emesis. Emesis is usually post prandial and at the time of her visit it had improved to 1-2 times per week and felt like it was related to metformin. Less of appetite with trulicity. Was taking nexium once daily. Reportedly was diagnosed with gastroparesis by PCP. Never had a colonoscopy and was due for CRC screening. Scheduled for TCS and EGD.   EGD 10/14/21: - small hiatal hernia - normal esophagus - gastritis s/p biopsy (normal mucosa) - normal duodenum - Advised PPI daily.    Colonoscopy 10/14/21: - Normal - Repeat in 10 years   OV 01/20/2022.  Improved GERD with Nexium twice daily.  Nausea vomiting significantly improved since starting Nexium twice daily.  Did report dairy does bother her.  Still having appetite and weight loss but this is likely secondary to Trulicity.  Constipation fairly well-controlled with Colace daily and MiraLAX as needed.  Denied any straining.  Bowel movement at least every day if not twice a day.   Nexium prescription updated, encouraged high-protein diet and protein supplementation and to monitor weight.  Follow-up in 6 months.   She underwent posterior cervical decompression and fusion from C4-T1 on 07/19/2022 and was discharged to inpatient rehab postop.  She did experience some constipation which was relieved with laxative assistance.  She was discharged from rehab with opioids, Nexium twice daily, and advised MiraLAX daily for constipation.    Last office visit 02/09/22.  2 episodes of vomiting occurring in the evening.  Happens randomly without nausea.  Always occurring after she eats.  It admits to eating fried/greasy foods rarely.  Noted to be on Trulicity.  Have reported 9-10 pound weight loss since prior visit.  Taking Colace daily and if she takes MiraLAX she does all bit more and was going on Rehman for the most part.  Denies any abdominal pain, melena, or BRBPR.  Advise continue Nexium twice daily.  Continue MiraLAX and Colace.  Advised protein supplements and avoid lactose.  Today: Constipation - had been on MiraLAX and Colace once daily.  Having to take mineral oil to have a bowel movement.She feels like the miralax compacts her stools and not working well. She states at times she is going 2-3 weeks without a BM. Since taking the mineral oil she is going every 2-3 days but small amounts. Sometimes she strains to be able to go. Has not tried any suppositories. No melena. Has had some mild toilet tissue hematochezia. Does have some mild nausea and vomiting with constipation. No  overt abdominal pain.   GERD - does well if she takes her Nexium twice daily.  Has had a difficulty getting enough medication to take twice daily as pharmacy is only filling 30 tablets at a time. No significant breakthrough symptoms. Some days she can go all day and not eat. Mild dysphagia with pills but does not occur all the time.   N/V - Very seldom. Mostly occurs with constipation.   Weight loss - Weight  stable. Appetite still poor but suspect this is related to Trulicity.  Wt Readings from Last 3 Encounters:  09/06/22 123 lb 7.3 oz (56 kg)  08/15/22 122 lb (55.3 kg)  08/05/22 122 lb 9.2 oz (55.6 kg)    Current Outpatient Medications  Medication Sig Dispense Refill   acetaminophen (TYLENOL) 325 MG tablet Take 2 tablets (650 mg total) by mouth every 4 (four) hours as needed for mild pain ((score 1 to 3) or temp > 100.5).     albuterol (PROVENTIL) 2 MG tablet Take 1 tablet (2 mg total) by mouth daily. 30 tablet 0   albuterol (VENTOLIN HFA) 108 (90 Base) MCG/ACT inhaler Inhale 1-2 puffs into the lungs every 4 (four) hours as needed for wheezing or shortness of breath. 8 g 0   aspirin 325 MG tablet Take 325 mg by mouth in the morning.     cetirizine (ZYRTEC) 10 MG tablet Take 10 mg by mouth at bedtime.     docusate sodium (COLACE) 100 MG capsule Take 100-200 mg by mouth daily as needed (constipation.).     Dulaglutide (TRULICITY) 1.5 MG/0.5ML SOPN Inject 1.5 mg into the skin once a week. (Patient taking differently: Inject 1.5 mg into the skin every Saturday.) 0.5 mL 0   esomeprazole (NEXIUM) 40 MG capsule Take 1 capsule (40 mg total) by mouth 2 (two) times daily before a meal. 60 capsule 0   glimepiride (AMARYL) 2 MG tablet Take 1 tablet (2 mg total) by mouth daily with breakfast. 30 tablet 0   HYDROcodone-acetaminophen (NORCO/VICODIN) 5-325 MG tablet Take 1 tablet by mouth every 6 (six) hours as needed for moderate pain. Trying to wean dose- max 4x/day 120 tablet 0   levothyroxine (SYNTHROID) 50 MCG tablet Take 1 tablet (50 mcg total) by mouth daily before breakfast. 30 tablet 0   lovastatin (MEVACOR) 20 MG tablet Take 1 tablet (20 mg total) by mouth at bedtime. (Patient taking differently: Take 20 mg by mouth in the morning.) 30 tablet 0   meclizine (ANTIVERT) 25 MG tablet Take 1 tablet (25 mg total) by mouth 3 (three) times daily as needed for dizziness. (Patient taking differently: Take 25 mg  by mouth See admin instructions. Take 1 tablet (25 mg) by mouth scheduled every morning, and may repeat dose (25 mg) by mouth up to twice daily if needed for dizziness/vertigo.) 30 tablet 0   midodrine (PROAMATINE) 5 MG tablet Take 1 tablet (5 mg total) by mouth 2 (two) times daily with breakfast and lunch. 60 tablet 5   Oxymetazoline HCl (MUCINEX NASAL SPRAY FULL FORCE NA) Place 1 spray into the nose in the morning and at bedtime. Mucinex Sinus Max Nasal Spray Clear & Cool     Phenylephrine-DM-GG-APAP (MUCINEX SINUS-MAX) 5-10-200-325 MG TABS Take 1 tablet by mouth in the morning and at bedtime.     polyethylene glycol powder (MIRALAX) 17 GM/SCOOP powder Take 17 g by mouth daily as needed for moderate constipation.     traZODone (DESYREL) 150 MG tablet Take 1 tablet (150  mg total) by mouth at bedtime as needed for sleep. (Patient taking differently: Take 150 mg by mouth at bedtime.) 30 tablet 5   No current facility-administered medications for this visit.    Past Medical History:  Diagnosis Date   Asthma    COPD (chronic obstructive pulmonary disease) (HCC)    DDD (degenerative disc disease), lumbar    Depression    Diabetes mellitus    Gastroparesis    GERD without esophagitis    High cholesterol    Hypertension    Hypothyroidism    PONV (postoperative nausea and vomiting)    PVD (peripheral vascular disease) (HCC)    Wears glasses     Past Surgical History:  Procedure Laterality Date   AORTA - BILATERAL FEMORAL ARTERY BYPASS GRAFT N/A 09/27/2018   Procedure: Redo Exposure  AORTOBIFEMORAL Bypass and bilateral femoral Artery ; Redo Aortobifemoral  BYPASS GRAFT.;  Surgeon: Cephus Shelling, MD;  Location: [facility] OR;  Service: Vascular;  Laterality: N/A;   BACK SURGERY     BIOPSY  10/14/2021   Procedure: BIOPSY;  Surgeon: Lanelle Bal, DO;  Location: [facility];  Service: Endoscopy;;   COLONOSCOPY WITH PROPOFOL N/A 10/14/2021   Procedure: COLONOSCOPY WITH PROPOFOL;  Surgeon:  Lanelle Bal, DO;  Location: [facility];  Service: Endoscopy;  Laterality: N/A;  8:30am   ESOPHAGOGASTRODUODENOSCOPY (EGD) WITH PROPOFOL N/A 10/14/2021   Procedure: ESOPHAGOGASTRODUODENOSCOPY (EGD) WITH PROPOFOL;  Surgeon: Lanelle Bal, DO;  Location: [facility];  Service: Endoscopy;  Laterality: N/A;   FEMORAL ARTERY STENT  right leg   INJECTION OF SILICONE OIL Left 09/06/2022   Procedure: INJECTION OF SILICONE OIL;  Surgeon: Carmela Rima, MD;  Location: [facility] OR;  Service: Ophthalmology;  Laterality: Left;   MEMBRANE PEEL Left 09/06/2022   Procedure: MEMBRANECTOMY;  Surgeon: Carmela Rima, MD;  Location: [facility] OR;  Service: Ophthalmology;  Laterality: Left;   PARS PLANA VITRECTOMY Left 09/06/2022   Procedure: PARS PLANA VITRECTOMY WITH 25 GAUGE;  Surgeon: Carmela Rima, MD;  Location: [facility] OR;  Service: Ophthalmology;  Laterality: Left;   PHOTOCOAGULATION WITH LASER Left 09/06/2022   Procedure: PHOTOCOAGULATION WITH LASER;  Surgeon: Carmela Rima, MD;  Location: [facility] OR;  Service: Ophthalmology;  Laterality: Left;   POSTERIOR CERVICAL FUSION/FORAMINOTOMY N/A 07/19/2022   Procedure: Posterior cervical fusion with lateral mass fixation - Cervical four-cervical five, Cervical five-Cervical six - Cervical six-Cervical seven - Cervical seven-Thoracic one with laminectomy;  Surgeon: Julio Sicks, MD;  Location: [facility] OR;  Service: Neurosurgery;  Laterality: N/A;   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   Procedure: REPAIR OF COMPLEX TRACTION RETINAL DETACHMENT;  Surgeon: Carmela Rima, MD;  Location: [facility] OR;  Service: Ophthalmology;  Laterality: Left;  MAC WITH BLOCK   TUBAL LIGATION      Family History  Problem Relation Age of Onset   Stroke Mother    Hypertension Mother    Heart failure Father    Asthma Father    Diabetes Father    Heart disease Father    Emphysema Paternal Grandfather    Colon cancer Neg Hx    Colon polyps Neg Hx     Allergies as of  10/10/2022 - Review Complete 10/10/2022  Allergen Reaction Noted   Metformin and related Nausea And Vomiting 07/14/2022    Social History   Socioeconomic History   Marital status: Single    Spouse name: Not on file   Number of children: 2   Years of education:  Not on file   Highest education level: Not on file  Occupational History   Not on file  Tobacco Use   Smoking status: Former    Current packs/day: 0.00    Types: Cigarettes    Quit date: 03/14/1982    Years since quitting: 40.6    Passive exposure: Past   Smokeless tobacco: Current    Types: Snuff  Vaping Use   Vaping status: Never Used  Substance and Sexual Activity   Alcohol use: No   Drug use: No   Sexual activity: Yes    Birth control/protection: Surgical    Comment: tubal  Other Topics Concern   Not on file  Social History Narrative   Not on file   Social Determinants of Health   Financial Resource Strain: Not on file  Food Insecurity: Not on file  Transportation Needs: Not on file  Physical Activity: Not on file  Stress: Not on file  Social Connections: Not on file     Review of Systems   Gen: + recent eye surgery. Denies fever, chills, anorexia. Denies fatigue, weakness, weight loss.  CV: Denies chest pain, palpitations, syncope, peripheral edema, and claudication. Resp: Denies dyspnea at rest, cough, wheezing, coughing up blood, and pleurisy. GI: See HPI Derm: Denies rash, itching, dry skin Psych: Denies depression, anxiety, memory loss, confusion. No homicidal or suicidal ideation.  Heme: Denies bruising, bleeding, and enlarged lymph nodes. + decreased mobility   Physical Exam   BP (!) 156/85   Pulse 94   Temp 98.2 F (36.8 C) (Oral)   Ht 5\' 4"  (1.626 m)   LMP 01/21/2012   BMI 21.19 kg/m   General:   Alert and oriented. No distress noted. Pleasant and cooperative.  Head:  Normocephalic and atraumatic. Soft neck brace in place.  Eyes:  Conjuctiva clear without scleral icterus. Patch  over left eye (recent eye surgery) Mouth:  Oral mucosa pink and moist. Good dentition. No lesions. Lungs:  Clear to auscultation bilaterally. No wheezes, rales, or rhonchi. No distress.  Heart:  S1, S2 present without murmurs appreciated.  Abdomen:  +BS, soft, non-tender and non-distended. No rebound or guarding. No HSM or masses noted. Exam limited given in wheelchair.  Rectal: deferred Extremities:  Without edema. Neurologic:  Alert and  oriented x4 Psych:  Alert and cooperative. Normal mood and affect.   Assessment  BONNETTE WESSNER is a 57 y.o. female with a history of constipation, asthma, COPD, diabetes, gastroparesis, GERD, HTN, hypothyroidism, and PVD presenting today for follow-up.  GERD: EGD in August 2023 with gastritis and normal duodenum. Does well on twice daily dosing however pharmacy has only been giving her enough for 2 weeks at a time. Will resubmit prescription again with accurate quantity.  Advised her to have pharmacy contact us if they are continuing to only feel for 2 weeks at a time versus 1 full month at a time.  Denies any significant or frequent breakthrough symptoms.  N/V, Weight loss: Has rare nausea/vomiting.  This typically occurs with ongoing constipation but is significantly less than previously.  Per review of weight since May she is stable at 122 pounds which is an ideal body weight for her.  Her lack of appetite is likely secondary to her Trulicity.  Constipation: Has been up to 2-3 weeks without a bowel movement.  Was taking MiraLAX and Colace once daily however she feels that MiraLAX stops for more and makes it difficult for her to go.  She is having some  intermittent hard stools as well as difficulty with straining.  Has taken some mineral to help with bowel movements since her last visit.  We discussed continuing stool softener daily, possibly increasing to twice daily pending how well Linzess works for her.  Samples provided today and if helpful advised her  to call back for prescription.  Educated patient and daughter on washout period.   PLAN   Continue Nexium 40 mg BID. Refilled today.  GERD diet Continue stool softener daily, could consider increasing to twice daily Increase water intake Trial Linzess 145 mcg daily. Continue high protein diet    Brooke Bonito, MSN, FNP-BC, AGACNP-BC [facility]

## 2022-10-10 ENCOUNTER — Encounter: Payer: Self-pay | Admitting: Gastroenterology

## 2022-10-10 ENCOUNTER — Ambulatory Visit (INDEPENDENT_AMBULATORY_CARE_PROVIDER_SITE_OTHER): Payer: 59 | Admitting: Gastroenterology

## 2022-10-10 VITALS — BP 156/85 | HR 94 | Temp 98.2°F | Ht 64.0 in

## 2022-10-10 DIAGNOSIS — K219 Gastro-esophageal reflux disease without esophagitis: Secondary | ICD-10-CM

## 2022-10-10 DIAGNOSIS — R112 Nausea with vomiting, unspecified: Secondary | ICD-10-CM | POA: Diagnosis not present

## 2022-10-10 DIAGNOSIS — K5900 Constipation, unspecified: Secondary | ICD-10-CM | POA: Diagnosis not present

## 2022-10-10 MED ORDER — ESOMEPRAZOLE MAGNESIUM 40 MG PO CPDR
40.0000 mg | DELAYED_RELEASE_CAPSULE | Freq: Two times a day (BID) | ORAL | 5 refills | Status: AC
Start: 2022-10-10 — End: ?

## 2022-10-10 NOTE — Patient Instructions (Addendum)
Continue taking your Nexium 40 mg twice daily.  I have updated the prescription for you.  If they still continue to only feel 30 pills at a time please have them call our office and let us know reasoning why.   For constipation: -Continue your daily stool softener -Increase your water intake.  Goal of at least 4 to 6 glasses of water daily. -Start Linzess 145 mcg (1 tablet) daily.  How to take Linzess: Once a day every day on empty stomach, at least 30 minutes before your first meal of the day. It is best to keep medications at a stable temperature Medication is best kept in its original bottle with the disket present.  It is a medication that is meant for everyday use and not to be used as needed.   What to expect: Constipation relief is typically felt in about 1 week Relief of abdominal pain, discomfort, and bloating begins in about 1 week with symptoms typically improving over 12 weeks and beyond. Diarrhea is most common side effect and typically begins within the first 2 weeks and can take 3-4 weeks to resolve It would be helpful to begin treatment over the weekend or when you can be closer to a bathroom   You can go to [email or web address] for patient support and sign up for daily medication reminders.    Please let me know if you do not have any results with the Linzess samples that I gave you as we do have a higher dose that we can try if needed.  Let me know if it is effective and you would like a prescription as I am happy to do that.  It was a pleasure to see you today. I want to create trusting relationships with patients. If you receive a survey regarding your visit,  I greatly appreciate you taking time to fill this out on paper or through your MyChart. I value your feedback.  Brooke Bonito, MSN, FNP-BC, AGACNP-BC [facility]

## 2022-10-17 ENCOUNTER — Telehealth: Payer: Self-pay | Admitting: *Deleted

## 2022-10-17 ENCOUNTER — Other Ambulatory Visit: Payer: Self-pay | Admitting: Gastroenterology

## 2022-10-17 MED ORDER — LINACLOTIDE 145 MCG PO CAPS: 145.0000 ug | ORAL_CAPSULE | Freq: Every day | ORAL | 5 refills | Status: DC

## 2022-10-17 NOTE — Telephone Encounter (Signed)
Pt called and would like Linzess sent to her pharmacy.

## 2022-10-18 ENCOUNTER — Telehealth: Payer: Self-pay | Admitting: *Deleted

## 2022-10-18 NOTE — Telephone Encounter (Signed)
Received approval for Linzess from 10/2022 -10/2023. Sent letter to scan center.

## 2022-10-25 DIAGNOSIS — E113521 Type 2 diabetes mellitus with proliferative diabetic retinopathy with traction retinal detachment involving the macula, right eye: Secondary | ICD-10-CM | POA: Diagnosis not present

## 2022-10-25 DIAGNOSIS — H4311 Vitreous hemorrhage, right eye: Secondary | ICD-10-CM | POA: Diagnosis not present

## 2022-10-25 DIAGNOSIS — H35031 Hypertensive retinopathy, right eye: Secondary | ICD-10-CM | POA: Diagnosis not present

## 2022-10-25 DIAGNOSIS — E113511 Type 2 diabetes mellitus with proliferative diabetic retinopathy with macular edema, right eye: Secondary | ICD-10-CM | POA: Diagnosis not present

## 2022-10-25 DIAGNOSIS — H3582 Retinal ischemia: Secondary | ICD-10-CM | POA: Diagnosis not present

## 2022-10-25 DIAGNOSIS — H53131 Sudden visual loss, right eye: Secondary | ICD-10-CM | POA: Diagnosis not present

## 2022-10-25 DIAGNOSIS — E113512 Type 2 diabetes mellitus with proliferative diabetic retinopathy with macular edema, left eye: Secondary | ICD-10-CM | POA: Diagnosis not present

## 2022-10-27 ENCOUNTER — Encounter (HOSPITAL_COMMUNITY): Payer: Self-pay | Admitting: Physical Therapy

## 2022-10-27 ENCOUNTER — Encounter (HOSPITAL_COMMUNITY): Payer: Self-pay | Admitting: Occupational Therapy

## 2022-10-27 ENCOUNTER — Ambulatory Visit (HOSPITAL_COMMUNITY): Payer: 59 | Attending: Physical Medicine and Rehabilitation | Admitting: Physical Therapy

## 2022-10-27 ENCOUNTER — Ambulatory Visit (HOSPITAL_COMMUNITY): Payer: 59 | Admitting: Occupational Therapy

## 2022-10-27 DIAGNOSIS — G959 Disease of spinal cord, unspecified: Secondary | ICD-10-CM | POA: Diagnosis not present

## 2022-10-27 DIAGNOSIS — R2689 Other abnormalities of gait and mobility: Secondary | ICD-10-CM | POA: Diagnosis not present

## 2022-10-27 DIAGNOSIS — R29818 Other symptoms and signs involving the nervous system: Secondary | ICD-10-CM | POA: Insufficient documentation

## 2022-10-27 DIAGNOSIS — R278 Other lack of coordination: Secondary | ICD-10-CM | POA: Diagnosis not present

## 2022-10-27 DIAGNOSIS — M6281 Muscle weakness (generalized): Secondary | ICD-10-CM | POA: Insufficient documentation

## 2022-10-27 NOTE — Therapy (Unsigned)
OUTPATIENT OCCUPATIONAL THERAPY NEURO EVALUATION  Patient Name: Alexandria Webster MRN: [id number] DOB:Jul 07, 1965, 57 y.o., female Today's Date: 10/28/2022  PCP: Assunta Found, MD REFERRING PROVIDER: Genice Rouge, MD  END OF SESSION:  OT End of Session - 10/28/22 1635     Visit Number 1    Number of Visits 13    Date for OT Re-Evaluation 12/16/22    Authorization Type [employer], [employer]    OT Start Time 1120    OT Stop Time 1206    OT Time Calculation (min) 46 min    Activity Tolerance Patient tolerated treatment well    Behavior During Therapy WFL for tasks assessed/performed             Past Medical History:  Diagnosis Date   Asthma    COPD (chronic obstructive pulmonary disease) (HCC)    DDD (degenerative disc disease), lumbar    Depression    Diabetes mellitus    Gastroparesis    GERD without esophagitis    High cholesterol    Hypertension    Hypothyroidism    PONV (postoperative nausea and vomiting)    PVD (peripheral vascular disease) (HCC)    Wears glasses    Past Surgical History:  Procedure Laterality Date   AORTA - BILATERAL FEMORAL ARTERY BYPASS GRAFT N/A 09/27/2018   Procedure: Redo Exposure  AORTOBIFEMORAL Bypass and bilateral femoral Artery ; Redo Aortobifemoral  BYPASS GRAFT.;  Surgeon: Cephus Shelling, MD;  Location: [facility] OR;  Service: Vascular;  Laterality: N/A;   BACK SURGERY     BIOPSY  10/14/2021   Procedure: BIOPSY;  Surgeon: Lanelle Bal, DO;  [facility];  Service: Endoscopy;;   COLONOSCOPY WITH PROPOFOL N/A 10/14/2021   Procedure: COLONOSCOPY WITH PROPOFOL;  Surgeon: Lanelle Bal, DO;  [facility]: [facility];  Service: Endoscopy;  Laterality: N/A;  8:30am   ESOPHAGOGASTRODUODENOSCOPY (EGD) WITH PROPOFOL N/A 10/14/2021   Procedure: ESOPHAGOGASTRODUODENOSCOPY (EGD) WITH PROPOFOL;  Surgeon: Lanelle Bal, DO;  [facility];  Service: Endoscopy;  Laterality: N/A;   FEMORAL ARTERY STENT  right leg    INJECTION OF SILICONE OIL Left 09/06/2022   Procedure: INJECTION OF SILICONE OIL;  Surgeon: Carmela Rima, MD;  Location: [facility] OR;  Service: Ophthalmology;  Laterality: Left;   MEMBRANE PEEL Left 09/06/2022   Procedure: MEMBRANECTOMY;  Surgeon: Carmela Rima, MD;  Location: [facility] OR;  Service: Ophthalmology;  Laterality: Left;   PARS PLANA VITRECTOMY Left 09/06/2022   Procedure: PARS PLANA VITRECTOMY WITH 25 GAUGE;  Surgeon: Carmela Rima, MD;  Location: [facility] OR;  Service: Ophthalmology;  Laterality: Left;   PHOTOCOAGULATION WITH LASER Left 09/06/2022   Procedure: PHOTOCOAGULATION WITH LASER;  Surgeon: Carmela Rima, MD;  Location: [facility] OR;  Service: Ophthalmology;  Laterality: Left;   POSTERIOR CERVICAL FUSION/FORAMINOTOMY N/A 07/19/2022   Procedure: Posterior cervical fusion with lateral mass fixation - Cervical four-cervical five, Cervical five-Cervical six - Cervical six-Cervical seven - Cervical seven-Thoracic one with laminectomy;  Surgeon: Julio Sicks, MD;  Location: [facility] OR;  Service: Neurosurgery;  Laterality: N/A;   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   Procedure: REPAIR OF COMPLEX TRACTION RETINAL DETACHMENT;  Surgeon: Carmela Rima, MD;  Location: [facility] OR;  Service: Ophthalmology;  Laterality: Left;  MAC WITH BLOCK   TUBAL LIGATION     Patient Active Problem List   Diagnosis Date Noted   Post-operative pain 08/15/2022   Orthostatic hypotension 08/15/2022   Impaired gait and mobility 08/15/2022   Mild  cognitive impairment 08/01/2022   Cervical myelopathy (HCC) 07/22/2022   Stenosis of cervical spine with myelopathy (HCC) 07/19/2022   Degeneration of cervical intervertebral disc 08/08/2021   Syncope and collapse 08/06/2021   Hypomagnesemia 08/06/2021   Hypothyroidism 08/06/2021   Allergic rhinitis 08/06/2021   AKI (acute kidney injury) (HCC) 08/06/2021   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/27/2018    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   Gastritis 10/01/2015   Gastroparesis due to DM (HCC) 10/01/2015   Personal history of noncompliance with medical treatment, presenting hazards to health 09/14/2015   Hypokalemia 11/13/2012   Constipation 11/09/2012   Facial cellulitis 11/07/2012   Leukocytosis 11/07/2012   Type 2 diabetes mellitus (HCC) 11/07/2012   Asthma 11/07/2012   PVD (peripheral vascular disease) (HCC) 11/07/2012   Hyponatremia 11/07/2012   Tachycardia 11/07/2012   Nausea & vomiting 11/07/2012   Essential hypertension, benign     ONSET DATE: 07/19/22 - Cervical Fusion of C5-T1  REFERRING DIAG: Cervical Myopathy with UE weakness  THERAPY DIAG:  Other lack of coordination  Other symptoms and signs involving the nervous system  Rationale for Evaluation and Treatment: Rehabilitation  SUBJECTIVE:   SUBJECTIVE STATEMENT: "The right arm is pretty good, the left can be numb and hard to control." Pt accompanied by: self and family member  PERTINENT HISTORY: hx of cervical myelopathy s/p C5-T1 decompression lamintomy and PCDF 07/19/22, hx of COPD, HTN, orthostatic hypotension, DM,PVD, CIR from 07/22/22 -08/05/22   PRECAUTIONS: Cervical  WEIGHT BEARING RESTRICTIONS: Yes <10lbs  PAIN:  Are you having pain? Yes: NPRS scale: 6/10 Pain location: neck Pain description: sharp and radiating Aggravating factors: a lot of movement and sitting unsupported Relieving factors: support and positioning  FALLS: Has patient fallen in last 6 months? No  LIVING ENVIRONMENT: Lives with: lives with their daughter Lives in: House/apartment Stairs: Yes: External: 5 steps; can reach both Has following equipment at home: Single point cane, Walker - 2 wheeled, Wheelchair (manual), shower chair, bed side commode, and Grab bars  PLOF: Independent with basic ADLs and Independent with household mobility with device  PATIENT GOALS: "To get independent"  OBJECTIVE:   HAND  DOMINANCE: Right  ADLs: Overall ADLs: 75% assist with all dressing and bathing, unable to step into shower, has to sponge bathe. Total assist with all IADL's.  MOBILITY STATUS: Needs Assist: min to mod assist  POSTURE COMMENTS:  rounded shoulders, forward head, and increased thoracic kyphosis Sitting balance: Sits without UE support up to 30 sec  ACTIVITY TOLERANCE: Activity tolerance: poor activity tolerance with all movements and activities.  FUNCTIONAL OUTCOME MEASURES: FOTO: 43.76  UPPER EXTREMITY ROM:    Active ROM Right eval Left eval  Shoulder flexion 135 123  Shoulder abduction 143 126  Shoulder internal rotation 90 90  Shoulder external rotation 21 54  (Blank rows = not tested)  UPPER EXTREMITY MMT:     MMT Right eval Left eval  Shoulder flexion 3+/5 3+/5  Shoulder abduction 3+/5 3/5  Shoulder adduction 4/5 4/5  Shoulder extension 4/5 4-/5  Shoulder internal rotation 4+/5 4+/5  Shoulder external rotation 4/5 4-/5  Elbow flexion 4-/5 4/5  Elbow extension 4/5 4/5  Wrist flexion 4/5 4-/5  Wrist extension 4-/5 4-/5  Wrist ulnar deviation 4/5 4/5  Wrist radial deviation 4/5 4/5  Wrist pronation 4+/5 4+/5  Wrist supination 4+/5 4+/5  (Blank rows = not tested)  HAND FUNCTION: Grip strength: Right: 24 lbs; Left: 11 lbs, Lateral  pinch: Right: 5 lbs, Left: 1 lbs, and 3 point pinch: Right: 4 lbs, Left: 1 lbs  COORDINATION: 9 Hole Peg test: Right: -- sec; Left: -- sec  SENSATION: Per pt report RUE gets mildly numb approximately 1-2 times per week, LUE gets severely heavy, numb, and tingly intermittently throughout the day  EDEMA: No swelling noted in UE  COGNITION: Overall cognitive status: Impaired  VISION: Subjective report: I just had 1 eye surgery and I have another one scheduled next month.  Baseline vision: Wears glasses all the time Visual history: retinopathy  OBSERVATIONS: Pt presents with UE tremors and ataxia.   TODAY'S TREATMENT:                                                                                                                               DATE: 10/27/22: Evaluation Only   PATIENT EDUCATION: Education details: A/ROM Person educated: Patient Education method: Programmer, multimedia, Facilities manager, and Handouts Education comprehension: verbalized understanding and returned demonstration  HOME EXERCISE PROGRAM: 8/15: A/ROM   GOALS: Goals reviewed with patient? Yes  SHORT TERM GOALS: Target date: 12/16/22  Pt will be provided and educated on HEP to improve BUE mobility for independent ADL's.   Goal status: INITIAL  2.  Pt will decrease BUE fascial restrictions to minimal amounts or less, in order to complete reaching tasks.   Goal status: INITIAL  3.  Pt will increase BUE ROM to 150 flexion and abduction, as well as 55 external rotation, in order to reach overhead and behind her back during dressing and bathing tasks.   Goal status: INITIAL  4.  Pt will increase BUE strength to 4+/5 in order to lift and carry items during cooking and cleaning tasks.   Goal status: INITIAL  5.  Pt will increase grip strength by 7# and pinch strength by 3# in order to grip and carry items for grooming and bathing tasks.   Goal status: INITIAL  6.  Pt will increase BUE 9 hole peg test  to 45 seconds or less in order to improve coordination to manipulate and utilize buttons, zippers, and hooks during dressing tasks.   Goal status: INITIAL   ASSESSMENT:  CLINICAL IMPRESSION: Patient is a 57 y.o. female who was seen today for occupational therapy evaluation for cervical myopathy. Pt demonstrating increased difficulties with coordination, UE strength, ataxia, weak grasps/pinches, and decreased endurance.    PERFORMANCE DEFICITS: in functional skills including ADLs, IADLs, coordination, dexterity, sensation, ROM, strength, pain, fascial restrictions, Fine motor control, Gross motor control, balance, body mechanics, endurance, and  UE functional use, cognitive skills including energy/drive, memory, problem solving, and safety awareness, and psychosocial skills including environmental adaptation, interpersonal interactions, and routines and behaviors.   IMPAIRMENTS: are limiting patient from ADLs, IADLs, rest and sleep, work, leisure, and social participation.   CO-MORBIDITIES: may have co-morbidities  that affects occupational performance. Patient will benefit from skilled OT to address above impairments and improve overall function.  MODIFICATION OR ASSISTANCE TO COMPLETE EVALUATION: No modification of tasks or assist necessary to complete an evaluation.  OT OCCUPATIONAL PROFILE AND HISTORY: Problem focused assessment: Including review of records relating to presenting problem.  CLINICAL DECISION MAKING: LOW - limited treatment options, no task modification necessary  REHAB POTENTIAL: Good  EVALUATION COMPLEXITY: Low    PLAN:  OT FREQUENCY: 2x/week  OT DURATION: 6 weeks  PLANNED INTERVENTIONS: self care/ADL training, therapeutic exercise, therapeutic activity, neuromuscular re-education, manual therapy, passive range of motion, functional mobility training, electrical stimulation, moist heat, patient/family education, energy conservation, coping strategies training, and DME and/or AE instructions  RECOMMENDED OTHER SERVICES: PT   CONSULTED AND AGREED WITH PLAN OF CARE: Patient and family member/caregiver  PLAN FOR NEXT SESSION: AA/ROM, A/ROM, Coordination, Grip and Pinch strengthening, UE strengthening   Trish Mage, OTR/L [facility] [phone] Kennyth Arnold, OT 10/28/2022, 4:40 PM

## 2022-10-27 NOTE — Therapy (Signed)
OUTPATIENT PHYSICAL THERAPY LOWER EXTREMITY EVALUATION   Patient Name: Alexandria Webster MRN: [id number] DOB:[birth year], 57 y.o., female Today's Date: 10/27/2022  END OF SESSION:  PT End of Session - 10/27/22 1030     Visit Number 1    Number of Visits 12    Date for PT Re-Evaluation 12/08/22    Authorization Type Primary: Aetna Secondary: Medicaid Healthy [employer]    PT Start Time 1032    PT Stop Time 1111    PT Time Calculation (min) 39 min    Activity Tolerance Patient tolerated treatment well    Behavior During Therapy WFL for tasks assessed/performed             Past Medical History:  Diagnosis Date   Asthma    COPD (chronic obstructive pulmonary disease) (HCC)    DDD (degenerative disc disease), lumbar    Depression    Diabetes mellitus    Gastroparesis    GERD without esophagitis    High cholesterol    Hypertension    Hypothyroidism    PONV (postoperative nausea and vomiting)    PVD (peripheral vascular disease) (HCC)    Wears glasses    Past Surgical History:  Procedure Laterality Date   AORTA - BILATERAL FEMORAL ARTERY BYPASS GRAFT N/A 09/27/2018   Procedure: Redo Exposure  AORTOBIFEMORAL Bypass and bilateral femoral Artery ; Redo Aortobifemoral  BYPASS GRAFT.;  Surgeon: Cephus Shelling, MD;  Location: [facility] OR;  Service: Vascular;  Laterality: N/A;   BACK SURGERY     BIOPSY  10/14/2021   Procedure: BIOPSY;  Surgeon: Lanelle Bal, DO;  [facility];  Service: Endoscopy;;   COLONOSCOPY WITH PROPOFOL N/A 10/14/2021   Procedure: COLONOSCOPY WITH PROPOFOL;  Surgeon: Lanelle Bal, DO;  [facility];  Service: Endoscopy;  Laterality: N/A;  8:30am   ESOPHAGOGASTRODUODENOSCOPY (EGD) WITH PROPOFOL N/A 10/14/2021   Procedure: ESOPHAGOGASTRODUODENOSCOPY (EGD) WITH PROPOFOL;  Surgeon: Lanelle Bal, DO;  [facility];  Service: Endoscopy;  Laterality: N/A;   FEMORAL ARTERY STENT  right leg   INJECTION OF SILICONE OIL Left  09/06/2022   Procedure: INJECTION OF SILICONE OIL;  Surgeon: Carmela Rima, MD;  Location: [facility] OR;  Service: Ophthalmology;  Laterality: Left;   MEMBRANE PEEL Left 09/06/2022   Procedure: MEMBRANECTOMY;  Surgeon: Carmela Rima, MD;  Location: [facility] OR;  Service: Ophthalmology;  Laterality: Left;   PARS PLANA VITRECTOMY Left 09/06/2022   Procedure: PARS PLANA VITRECTOMY WITH 25 GAUGE;  Surgeon: Carmela Rima, MD;  Location: [facility] OR;  Service: Ophthalmology;  Laterality: Left;   PHOTOCOAGULATION WITH LASER Left 09/06/2022   Procedure: PHOTOCOAGULATION WITH LASER;  Surgeon: Carmela Rima, MD;  Location: [facility] OR;  Service: Ophthalmology;  Laterality: Left;   POSTERIOR CERVICAL FUSION/FORAMINOTOMY N/A 07/19/2022   Procedure: Posterior cervical fusion with lateral mass fixation - Cervical four-cervical five, Cervical five-Cervical six - Cervical six-Cervical seven - Cervical seven-Thoracic one with laminectomy;  Surgeon: Julio Sicks, MD;  Location: [facility] OR;  Service: Neurosurgery;  Laterality: N/A;   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   Procedure: REPAIR OF COMPLEX TRACTION RETINAL DETACHMENT;  Surgeon: Carmela Rima, MD;  Location: [facility] OR;  Service: Ophthalmology;  Laterality: Left;  MAC WITH BLOCK   TUBAL LIGATION     Patient Active Problem List   Diagnosis Date Noted   Post-operative pain 08/15/2022   Orthostatic hypotension 08/15/2022   Impaired gait and mobility 08/15/2022   Mild cognitive impairment 08/01/2022  Cervical myelopathy (HCC) 07/22/2022   Stenosis of cervical spine with myelopathy (HCC) 07/19/2022   Degeneration of cervical intervertebral disc 08/08/2021   Syncope and collapse 08/06/2021   Hypomagnesemia 08/06/2021   Hypothyroidism 08/06/2021   Allergic rhinitis 08/06/2021   AKI (acute kidney injury) (HCC) 08/06/2021   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/27/2018   Aortobifemoral bypass graft  thrombosis (HCC) 09/04/2018   Gastritis 10/01/2015   Gastroparesis due to DM (HCC) 10/01/2015   Personal history of noncompliance with medical treatment, presenting hazards to health 09/14/2015   Hypokalemia 11/13/2012   Constipation 11/09/2012   Facial cellulitis 11/07/2012   Leukocytosis 11/07/2012   Type 2 diabetes mellitus (HCC) 11/07/2012   Asthma 11/07/2012   PVD (peripheral vascular disease) (HCC) 11/07/2012   Hyponatremia 11/07/2012   Tachycardia 11/07/2012   Nausea & vomiting 11/07/2012   Essential hypertension, benign     PCP: Assunta Found MD  REFERRING PROVIDER: Genice Rouge, MD  REFERRING DIAG: G95.9 (ICD-10-CM) - Cervical myelopathy (HCC); Cervical myelopathy- with UE and LE weakness- since Cervical decompression and fusion- C5-T1-  THERAPY DIAG:  Other abnormalities of gait and mobility  Muscle weakness (generalized)  Cervical myelopathy (HCC)  Rationale for Evaluation and Treatment: Rehabilitation  ONSET DATE: DOS 07/19/22  SUBJECTIVE:   SUBJECTIVE STATEMENT: Patient states she fell last year and had issues with mobility following. She then had surgery to fix myelopathy and went to inpatient rehab. Patient states she can walk with a RW now but balance is not good. She is limited with her walking ability due to weakness and endurance. She has trouble with steps to get in/out of the house. Left leg wants to fold up under her sometimes with walking.   PERTINENT HISTORY: hx of cervical myelopathy s/p C5-T1 decompression lamintomy and PCDF 07/19/22, hx of COPD, HTN, orthostatic hypotension, DM,PVD, CIR from 07/22/22 -08/05/22 PAIN:  Are you having pain? Yes: NPRS scale: 4-5/10 Pain location: neck Pain description: hurt Aggravating factors: sitting Relieving factors: meds, movement sometimes  PRECAUTIONS: Fall and Other: possible cervical ?  WEIGHT BEARING RESTRICTIONS: No  FALLS:  Has patient fallen in last 6 months? No  OCCUPATION: disabled  PLOF:  Independent  PATIENT GOALS: to walk   OBJECTIVE: (objective measures from initial evaluation unless otherwise dated)   COGNITION: Overall cognitive status: Within functional limits for tasks assessed     SENSATION: WFL    POSTURE: rounded shoulders, forward head, and increased thoracic kyphosis(in neck brace - soft collar)    LOWER EXTREMITY ROM: WFL for tasks assessed  Active ROM Right eval Left eval  Hip flexion    Hip extension    Hip abduction    Hip adduction    Hip internal rotation    Hip external rotation    Knee flexion    Knee extension    Ankle dorsiflexion    Ankle plantarflexion    Ankle inversion    Ankle eversion     (Blank rows = not tested) *= pain/symptoms  LOWER EXTREMITY MMT:  MMT Right eval Left eval  Hip flexion 4 4-  Hip extension    Hip abduction    Hip adduction    Hip internal rotation    Hip external rotation    Knee flexion 4- 4  Knee extension 4 4  Ankle dorsiflexion 4 3+  Ankle plantarflexion    Ankle inversion    Ankle eversion     (Blank rows = not tested) *= pain/symptoms  FUNCTIONAL TESTS:  5 times sit to stand: 36.13 seconds with UE support Timed up and go (TUG): 37.46 seconds with RW 2 minute walk test: 75 feet with RW, had to stop at 1 minute due to fatigue   GAIT: Distance walked: 100 feet Assistive device utilized: Walker - 2 wheeled Level of assistance: CGA Comments: ; trunk flexed,  had to stop at 1 minute due to fatigue   TODAY'S TREATMENT:                                                                                                                              DATE:  10/27/22 Eval and HEP     PATIENT EDUCATION:  Education details: Patient educated on exam findings, POC, scope of PT, HEP, Person educated: Patient Education method: Programmer, multimedia, Demonstration, and Handouts Education comprehension: verbalized understanding, returned demonstration, verbal cues required, and tactile cues  required  HOME EXERCISE PROGRAM: Access Code: [email or web address] URL: [email or web address]   Date: 10/27/2022 - Sit to Stand  - 3 x daily - 7 x weekly - 2 sets - 10 reps - Seated Long Arc Quad  - 3 x daily - 7 x weekly - 2 sets - 10 reps - 5-10 second hold  ASSESSMENT:  CLINICAL IMPRESSION: Patient a 57 y.o. y.o. female who was seen today for physical therapy evaluation and treatment for weakness due to hx of cervical myelopathy. Patient presents with pain limited deficits in bilateral LE strength, ROM, endurance, activity tolerance, gait, balance, posture,  and functional mobility with ADL. Patient is having to modify and restrict ADL as indicated by subjective information and objective measures which is affecting overall participation. Patient will benefit from skilled physical therapy in order to improve function and reduce impairment.  OBJECTIVE IMPAIRMENTS: decreased activity tolerance, decreased balance, decreased endurance, decreased mobility, difficulty walking, decreased ROM, decreased strength, increased muscle spasms, impaired flexibility, improper body mechanics, postural dysfunction, and pain.   ACTIVITY LIMITATIONS: carrying, lifting, bending, standing, squatting, stairs, transfers, locomotion level, and caring for others  PARTICIPATION LIMITATIONS: meal prep, cleaning, laundry, shopping, community activity, and yard work  PERSONAL FACTORS: Fitness, Time since onset of injury/illness/exacerbation, and 3+ comorbidities: hx cervical myelopathy from fall, cervical fusion C5-T1, COPD, DM, HTN, PVD  are also affecting patient's functional outcome.   REHAB POTENTIAL: Good  CLINICAL DECISION MAKING: Evolving/moderate complexity  EVALUATION COMPLEXITY: Moderate   GOALS: Goals reviewed with patient? Yes  SHORT TERM GOALS: Target date: 11/17/2022    Patient will be independent with HEP in order to improve functional outcomes. Baseline: Goal status: INITIAL  2.  Patient  will report at least 25% improvement in symptoms for improved quality of life. Baseline: Goal status: INITIAL    LONG TERM GOALS: Target date: 12/08/2022    Timed up and go (TUG): 37.46 seconds with RW  Patient will report at least 75% improvement in symptoms for improved quality of life. Baseline:  Goal status: INITIAL  2.  Patient will  improve Timed up and go (TUG) time to less than 20 seconds in order to improved safe ambulation ability. Baseline: 37.46 seconds with RW Goal status: INITIAL  3.  Patient will be able to navigate stairs with reciprocal pattern without compensation in order to demonstrate improved LE strength. Baseline: patient states impaired and requires assistance Goal status: INITIAL  4.  Patient will be able to ambulate at least 226 feet in in order to demonstrate improved tolerance to activity. Baseline: 75 feet with RW, had to stop at 1 minute due to fatigue Goal status: INITIAL  5.  Patient will be able to complete 5x STS in under 20 seconds in order to reduce the risk of falls. Baseline: 36.13 seconds with UE support Goal status: INITIAL  6.  Patient will demonstrate grade of 4+/5 MMT grade in all tested musculature as evidence of improved strength to assist with stair ambulation and gait.   Baseline: see above Goal status: INITIAL   PLAN:  PT FREQUENCY: 2x/week  PT DURATION: 6 weeks  PLANNED INTERVENTIONS: Therapeutic exercises, Therapeutic activity, Neuromuscular re-education, Balance training, Gait training, Patient/Family education, Joint manipulation, Joint mobilization, Stair training, Orthotic/Fit training, DME instructions, Aquatic Therapy, Dry Needling, Electrical stimulation, Spinal manipulation, Spinal mobilization, Cryotherapy, Moist heat, Compression bandaging, scar mobilization, Splintting, Taping, Traction, Ultrasound, Ionotophoresis 4mg /ml Dexamethasone, and Manual therapy  PLAN FOR NEXT SESSION: f/u with HEP, bilateral  LE/functional strengthening, gait and balance training, possibly postural strengthening   Wyman Songster, PT 10/27/2022, 12:13 PM

## 2022-10-31 ENCOUNTER — Encounter (HOSPITAL_COMMUNITY): Payer: Self-pay

## 2022-10-31 ENCOUNTER — Ambulatory Visit (HOSPITAL_COMMUNITY): Payer: 59

## 2022-10-31 DIAGNOSIS — R278 Other lack of coordination: Secondary | ICD-10-CM | POA: Diagnosis not present

## 2022-10-31 DIAGNOSIS — G959 Disease of spinal cord, unspecified: Secondary | ICD-10-CM

## 2022-10-31 DIAGNOSIS — M6281 Muscle weakness (generalized): Secondary | ICD-10-CM | POA: Diagnosis not present

## 2022-10-31 DIAGNOSIS — R29818 Other symptoms and signs involving the nervous system: Secondary | ICD-10-CM | POA: Diagnosis not present

## 2022-10-31 DIAGNOSIS — R2689 Other abnormalities of gait and mobility: Secondary | ICD-10-CM | POA: Diagnosis not present

## 2022-10-31 NOTE — Therapy (Signed)
OUTPATIENT PHYSICAL THERAPY LOWER EXTREMITY TREATMENT   Patient Name: Alexandria Webster MRN: [id number] DOB:09/12/1965, 57 y.o., female Today's Date: 10/31/2022  END OF SESSION:  PT End of Session - 10/31/22 0908     Visit Number 2    Number of Visits 12    Date for PT Re-Evaluation 12/08/22    Authorization Type Primary: [employer] Secondary: Medicaid [employer]    PT Start Time 0902    PT Stop Time [phone]    PT Time Calculation (min) 41 min    Activity Tolerance Patient tolerated treatment well    Behavior During Therapy WFL for tasks assessed/performed              Past Medical History:  Diagnosis Date   Asthma    COPD (chronic obstructive pulmonary disease) (HCC)    DDD (degenerative disc disease), lumbar    Depression    Diabetes mellitus    Gastroparesis    GERD without esophagitis    High cholesterol    Hypertension    Hypothyroidism    PONV (postoperative nausea and vomiting)    PVD (peripheral vascular disease) (HCC)    Wears glasses    Past Surgical History:  Procedure Laterality Date   AORTA - BILATERAL FEMORAL ARTERY BYPASS GRAFT N/A 09/27/2018   Procedure: Redo Exposure  AORTOBIFEMORAL Bypass and bilateral femoral Artery ; Redo Aortobifemoral  BYPASS GRAFT.;  Surgeon: Cephus Shelling, MD;  Location: [facility] OR;  Service: Vascular;  Laterality: N/A;   BACK SURGERY     BIOPSY  10/14/2021   Procedure: BIOPSY;  Surgeon: Lanelle Bal, DO;  Location: [facility];  Service: Endoscopy;;   COLONOSCOPY WITH PROPOFOL N/A 10/14/2021   Procedure: COLONOSCOPY WITH PROPOFOL;  Surgeon: Lanelle Bal, DO;  Location: [facility];  Service: Endoscopy;  Laterality: N/A;  8:30am   ESOPHAGOGASTRODUODENOSCOPY (EGD) WITH PROPOFOL N/A 10/14/2021   Procedure: ESOPHAGOGASTRODUODENOSCOPY (EGD) WITH PROPOFOL;  Surgeon: Lanelle Bal, DO;  Location: [facility];  Service: Endoscopy;  Laterality: N/A;   FEMORAL ARTERY STENT  right leg   INJECTION OF SILICONE OIL Left  09/06/2022   Procedure: INJECTION OF SILICONE OIL;  Surgeon: Carmela Rima, MD;  Location: [facility] OR;  Service: Ophthalmology;  Laterality: Left;   MEMBRANE PEEL Left 09/06/2022   Procedure: MEMBRANECTOMY;  Surgeon: Carmela Rima, MD;  Location: [facility] OR;  Service: Ophthalmology;  Laterality: Left;   PARS PLANA VITRECTOMY Left 09/06/2022   Procedure: PARS PLANA VITRECTOMY WITH 25 GAUGE;  Surgeon: Carmela Rima, MD;  Location: [facility] OR;  Service: Ophthalmology;  Laterality: Left;   PHOTOCOAGULATION WITH LASER Left 09/06/2022   Procedure: PHOTOCOAGULATION WITH LASER;  Surgeon: Carmela Rima, MD;  Location: [facility] OR;  Service: Ophthalmology;  Laterality: Left;   POSTERIOR CERVICAL FUSION/FORAMINOTOMY N/A 07/19/2022   Procedure: Posterior cervical fusion with lateral mass fixation - Cervical four-cervical five, Cervical five-Cervical six - Cervical six-Cervical seven - Cervical seven-Thoracic one with laminectomy;  Surgeon: Julio Sicks, MD;  Location: [facility] OR;  Service: Neurosurgery;  Laterality: N/A;   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   Procedure: REPAIR OF COMPLEX TRACTION RETINAL DETACHMENT;  Surgeon: Carmela Rima, MD;  Location: [facility] OR;  Service: Ophthalmology;  Laterality: Left;  MAC WITH BLOCK   TUBAL LIGATION     Patient Active Problem List   Diagnosis Date Noted   Post-operative pain 08/15/2022   Orthostatic hypotension 08/15/2022   Impaired gait and mobility 08/15/2022   Mild cognitive impairment 08/01/2022  Cervical myelopathy (HCC) 07/22/2022   Stenosis of cervical spine with myelopathy (HCC) 07/19/2022   Degeneration of cervical intervertebral disc 08/08/2021   Syncope and collapse 08/06/2021   Hypomagnesemia 08/06/2021   Hypothyroidism 08/06/2021   Allergic rhinitis 08/06/2021   AKI (acute kidney injury) (HCC) 08/06/2021   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/27/2018   Aortobifemoral bypass graft  thrombosis (HCC) 09/04/2018   Gastritis 10/01/2015   Gastroparesis due to DM (HCC) 10/01/2015   Personal history of noncompliance with medical treatment, presenting hazards to health 09/14/2015   Hypokalemia 11/13/2012   Constipation 11/09/2012   Facial cellulitis 11/07/2012   Leukocytosis 11/07/2012   Type 2 diabetes mellitus (HCC) 11/07/2012   Asthma 11/07/2012   PVD (peripheral vascular disease) (HCC) 11/07/2012   Hyponatremia 11/07/2012   Tachycardia 11/07/2012   Nausea & vomiting 11/07/2012   Essential hypertension, benign     PCP: Assunta Found MD  REFERRING PROVIDER: Genice Rouge, MD  REFERRING DIAG: G95.9 (ICD-10-CM) - Cervical myelopathy (HCC); Cervical myelopathy- with UE and LE weakness- since Cervical decompression and fusion- C5-T1-  THERAPY DIAG:  Muscle weakness (generalized)  Cervical myelopathy (HCC)  Other abnormalities of gait and mobility  Rationale for Evaluation and Treatment: Rehabilitation  ONSET DATE: DOS 07/19/22 Next apt  Dr Jordan Likes spinal surgeon 11/04/22  SUBJECTIVE:   SUBJECTIVE STATEMENT: 10/31/22:  Pt arrived with daughter, sitting in WC.  Reports she has began leg lifts at home.  Reports minimal walking at home due to weakness as well as difficulty seeing, has apt with [facility] eye surgery on 11/15/22.    Eval:  Patient states she fell last year and had issues with mobility following. She then had surgery to fix myelopathy and went to inpatient rehab. Patient states she can walk with a RW now but balance is not good. She is limited with her walking ability due to weakness and endurance. She has trouble with steps to get in/out of the house. Left leg wants to fold up under her sometimes with walking.   PERTINENT HISTORY: hx of cervical myelopathy s/p C5-T1 decompression lamintomy and PCDF 07/19/22, hx of COPD, HTN, orthostatic hypotension, DM,PVD, CIR from 07/22/22 -08/05/22 PAIN:  Are you having pain? Yes: NPRS scale: 4-5/10 Pain location: neck Pain  description: hurt Aggravating factors: sitting Relieving factors: meds, movement sometimes  PRECAUTIONS: Fall and Other: possible cervical ?  WEIGHT BEARING RESTRICTIONS: No  FALLS:  Has patient fallen in last 6 months? No  OCCUPATION: disabled  PLOF: Independent  PATIENT GOALS: to walk   OBJECTIVE: (objective measures from initial evaluation unless otherwise dated)   COGNITION: Overall cognitive status: Within functional limits for tasks assessed     SENSATION: WFL    POSTURE: rounded shoulders, forward head, and increased thoracic kyphosis(in neck brace - soft collar)    LOWER EXTREMITY ROM: WFL for tasks assessed  Active ROM Right eval Left eval  Hip flexion    Hip extension    Hip abduction    Hip adduction    Hip internal rotation    Hip external rotation    Knee flexion    Knee extension    Ankle dorsiflexion    Ankle plantarflexion    Ankle inversion    Ankle eversion     (Blank rows = not tested) *= pain/symptoms  LOWER EXTREMITY MMT:  MMT Right eval Left eval  Hip flexion 4 4-  Hip extension    Hip abduction    Hip adduction  Hip internal rotation    Hip external rotation    Knee flexion 4- 4  Knee extension 4 4  Ankle dorsiflexion 4 3+  Ankle plantarflexion    Ankle inversion    Ankle eversion     (Blank rows = not tested) *= pain/symptoms    FUNCTIONAL TESTS:  5 times sit to stand: 36.13 seconds with UE support Timed up and go (TUG): 37.46 seconds with RW 2 minute walk test: 75 feet with RW, had to stop at 1 minute due to fatigue   GAIT: Distance walked: 100 feet Assistive device utilized: Environmental consultant - 2 wheeled Level of assistance: CGA Comments: ; trunk flexed,  had to stop at 1 minute due to fatigue   TODAY'S TREATMENT:                                                                                                                              DATE:  10/31/22: Reviewed goals Educated importance of HEP  complaince Pt able to recall, admits only doing LAQ currently not began STS yet  STS 6 times prior fatigue with HHA LAQ 4 times prior fatigue Seated scapular retraction 10x 5" Standing: March alternating 8x with HHA Heel raises 7x with UE support  Ambulate RW 11ft  Nustep UE/LE x 3 min prior fatigue   10/27/22 Eval and HEP     PATIENT EDUCATION:  Education details: Patient educated on exam findings, POC, scope of PT, HEP, Person educated: Patient Education method: Programmer, multimedia, Demonstration, and Handouts Education comprehension: verbalized understanding, returned demonstration, verbal cues required, and tactile cues required  HOME EXERCISE PROGRAM: Access Code: [email or web address] URL: [email or web address]   Date: 10/27/2022 - Sit to Stand  - 3 x daily - 7 x weekly - 2 sets - 10 reps - Seated Long Arc Quad  - 3 x daily - 7 x weekly - 2 sets - 10 reps - 5-10 second hold  10/31/22: -seated posture -scapular retraction ASSESSMENT:  CLINICAL IMPRESSION: 10/31/22:  Reviewed goals and educated importance of HEP compliance for maximal benefits.  Pt reports she has began LAQ but not the STS exercise yet.  Session focus with strengthening and activities to improve activity tolerance.  Pt easily fatigued, required seated rest breaks between each exercise.  Pt presents with poor posture, forward head and rounded shoulders with brace on neck.  Stated she returned to MD on 11/04/22 to see if able to remove brace.  Pt educated importance of good posture for pain control and balance.  Added scapular retraction and seated posture to HEP.  Gait training complete with RW and no LOB episodes, slow labored cadence with easy fatigue.  EOS on Nustep to assist with activity tolerance, pt able to complete 3' prior fatigue.  No reports of pain through session.  Eval:  Patient a 57 y.o. y.o. female who was seen today for physical therapy evaluation and treatment for weakness due to hx of cervical  myelopathy. Patient presents  with pain limited deficits in bilateral LE strength, ROM, endurance, activity tolerance, gait, balance, posture,  and functional mobility with ADL. Patient is having to modify and restrict ADL as indicated by subjective information and objective measures which is affecting overall participation. Patient will benefit from skilled physical therapy in order to improve function and reduce impairment.  OBJECTIVE IMPAIRMENTS: decreased activity tolerance, decreased balance, decreased endurance, decreased mobility, difficulty walking, decreased ROM, decreased strength, increased muscle spasms, impaired flexibility, improper body mechanics, postural dysfunction, and pain.   ACTIVITY LIMITATIONS: carrying, lifting, bending, standing, squatting, stairs, transfers, locomotion level, and caring for others  PARTICIPATION LIMITATIONS: meal prep, cleaning, laundry, shopping, community activity, and yard work  PERSONAL FACTORS: Fitness, Time since onset of injury/illness/exacerbation, and 3+ comorbidities: hx cervical myelopathy from fall, cervical fusion C5-T1, COPD, DM, HTN, PVD  are also affecting patient's functional outcome.   REHAB POTENTIAL: Good  CLINICAL DECISION MAKING: Evolving/moderate complexity  EVALUATION COMPLEXITY: Moderate   GOALS: Goals reviewed with patient? Yes  SHORT TERM GOALS: Target date: 11/17/2022    Patient will be independent with HEP in order to improve functional outcomes. Baseline: Goal status: IN PROGRESS  2.  Patient will report at least 25% improvement in symptoms for improved quality of life. Baseline: Goal status: IN PROGRESS    LONG TERM GOALS: Target date: 12/08/2022    Timed up and go (TUG): 37.46 seconds with RW  Patient will report at least 75% improvement in symptoms for improved quality of life. Baseline:  Goal status: IN PROGRESS  2.  Patient will improve Timed up and go (TUG) time to less than 20 seconds in order to  improved safe ambulation ability. Baseline: 37.46 seconds with RW Goal status: IN PROGRESS  3.  Patient will be able to navigate stairs with reciprocal pattern without compensation in order to demonstrate improved LE strength. Baseline: patient states impaired and requires assistance Goal status: IN PROGRESS  4.  Patient will be able to ambulate at least 226 feet in in order to demonstrate improved tolerance to activity. Baseline: 75 feet with RW, had to stop at 1 minute due to fatigue Goal status: IN PROGRESS  5.  Patient will be able to complete 5x STS in under 20 seconds in order to reduce the risk of falls. Baseline: 36.13 seconds with UE support Goal status: IN PROGRESS  6.  Patient will demonstrate grade of 4+/5 MMT grade in all tested musculature as evidence of improved strength to assist with stair ambulation and gait.   Baseline: see above Goal status: IN PROGRESS   PLAN:  PT FREQUENCY: 2x/week  PT DURATION: 6 weeks  PLANNED INTERVENTIONS: Therapeutic exercises, Therapeutic activity, Neuromuscular re-education, Balance training, Gait training, Patient/Family education, Joint manipulation, Joint mobilization, Stair training, Orthotic/Fit training, DME instructions, Aquatic Therapy, Dry Needling, Electrical stimulation, Spinal manipulation, Spinal mobilization, Cryotherapy, Moist heat, Compression bandaging, scar mobilization, Splintting, Taping, Traction, Ultrasound, Ionotophoresis 4mg /ml Dexamethasone, and Manual therapy  PLAN FOR NEXT SESSION: f/u with HEP, bilateral LE/functional strengthening, gait and balance training, possibly postural strengthening  Becky Sax, LPTA/CLT; CBIS [phone]  Juel Burrow, PTA 10/31/2022, 10:03 AM

## 2022-11-02 ENCOUNTER — Encounter (HOSPITAL_COMMUNITY): Payer: Self-pay | Admitting: Occupational Therapy

## 2022-11-02 ENCOUNTER — Ambulatory Visit (HOSPITAL_COMMUNITY): Payer: 59 | Admitting: Physical Therapy

## 2022-11-02 ENCOUNTER — Ambulatory Visit (HOSPITAL_COMMUNITY): Payer: 59 | Admitting: Occupational Therapy

## 2022-11-02 DIAGNOSIS — R278 Other lack of coordination: Secondary | ICD-10-CM | POA: Diagnosis not present

## 2022-11-02 DIAGNOSIS — G959 Disease of spinal cord, unspecified: Secondary | ICD-10-CM | POA: Diagnosis not present

## 2022-11-02 DIAGNOSIS — R2689 Other abnormalities of gait and mobility: Secondary | ICD-10-CM

## 2022-11-02 DIAGNOSIS — R29818 Other symptoms and signs involving the nervous system: Secondary | ICD-10-CM

## 2022-11-02 DIAGNOSIS — M6281 Muscle weakness (generalized): Secondary | ICD-10-CM | POA: Diagnosis not present

## 2022-11-02 NOTE — Therapy (Signed)
OUTPATIENT OCCUPATIONAL THERAPY NEURO TREATMENT  Patient Name: Alexandria Webster MRN: [id number] DOB:Oct 20, 1965, 57 y.o., female Today's Date: 11/02/2022  PCP: Assunta Found, MD REFERRING PROVIDER: Genice Rouge, MD  END OF SESSION:  OT End of Session - 11/02/22 1122     Visit Number 2    Number of Visits 13    Date for OT Re-Evaluation 12/16/22    Authorization Type [employer], [employer]    OT Start Time 1030    OT Stop Time 1112    OT Time Calculation (min) 42 min    Activity Tolerance Patient tolerated treatment well    Behavior During Therapy WFL for tasks assessed/performed              Past Medical History:  Diagnosis Date   Asthma    COPD (chronic obstructive pulmonary disease) (HCC)    DDD (degenerative disc disease), lumbar    Depression    Diabetes mellitus    Gastroparesis    GERD without esophagitis    High cholesterol    Hypertension    Hypothyroidism    PONV (postoperative nausea and vomiting)    PVD (peripheral vascular disease) (HCC)    Wears glasses    Past Surgical History:  Procedure Laterality Date   AORTA - BILATERAL FEMORAL ARTERY BYPASS GRAFT N/A 09/27/2018   Procedure: Redo Exposure  AORTOBIFEMORAL Bypass and bilateral femoral Artery ; Redo Aortobifemoral  BYPASS GRAFT.;  Surgeon: Cephus Shelling, MD;  Location: [facility] OR;  Service: Vascular;  Laterality: N/A;   BACK SURGERY     BIOPSY  10/14/2021   Procedure: BIOPSY;  Surgeon: Lanelle Bal, DO;  Location: [facility];  Service: Endoscopy;;   COLONOSCOPY WITH PROPOFOL N/A 10/14/2021   Procedure: COLONOSCOPY WITH PROPOFOL;  Surgeon: Lanelle Bal, DO;  Location: [facility];  Service: Endoscopy;  Laterality: N/A;  8:30am   ESOPHAGOGASTRODUODENOSCOPY (EGD) WITH PROPOFOL N/A 10/14/2021   Procedure: ESOPHAGOGASTRODUODENOSCOPY (EGD) WITH PROPOFOL;  Surgeon: Lanelle Bal, DO;  Location: [facility];  Service: Endoscopy;  Laterality: N/A;   FEMORAL ARTERY STENT  right leg    INJECTION OF SILICONE OIL Left 09/06/2022   Procedure: INJECTION OF SILICONE OIL;  Surgeon: Carmela Rima, MD;  Location: [facility] OR;  Service: Ophthalmology;  Laterality: Left;   MEMBRANE PEEL Left 09/06/2022   Procedure: MEMBRANECTOMY;  Surgeon: Carmela Rima, MD;  Location: [facility] OR;  Service: Ophthalmology;  Laterality: Left;   PARS PLANA VITRECTOMY Left 09/06/2022   Procedure: PARS PLANA VITRECTOMY WITH 25 GAUGE;  Surgeon: Carmela Rima, MD;  Location: [facility] OR;  Service: Ophthalmology;  Laterality: Left;   PHOTOCOAGULATION WITH LASER Left 09/06/2022   Procedure: PHOTOCOAGULATION WITH LASER;  Surgeon: Carmela Rima, MD;  Location: [facility] OR;  Service: Ophthalmology;  Laterality: Left;   POSTERIOR CERVICAL FUSION/FORAMINOTOMY N/A 07/19/2022   Procedure: Posterior cervical fusion with lateral mass fixation - Cervical four-cervical five, Cervical five-Cervical six - Cervical six-Cervical seven - Cervical seven-Thoracic one with laminectomy;  Surgeon: Julio Sicks, MD;  Location: [facility] OR;  Service: Neurosurgery;  Laterality: N/A;   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   Procedure: REPAIR OF COMPLEX TRACTION RETINAL DETACHMENT;  Surgeon: Carmela Rima, MD;  Location: [facility] OR;  Service: Ophthalmology;  Laterality: Left;  MAC WITH BLOCK   TUBAL LIGATION     Patient Active Problem List   Diagnosis Date Noted   Post-operative pain 08/15/2022   Orthostatic hypotension 08/15/2022   Impaired gait and mobility 08/15/2022  Mild cognitive impairment 08/01/2022   Cervical myelopathy (HCC) 07/22/2022   Stenosis of cervical spine with myelopathy (HCC) 07/19/2022   Degeneration of cervical intervertebral disc 08/08/2021   Syncope and collapse 08/06/2021   Hypomagnesemia 08/06/2021   Hypothyroidism 08/06/2021   Allergic rhinitis 08/06/2021   AKI (acute kidney injury) (HCC) 08/06/2021   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/27/2018    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   Gastritis 10/01/2015   Gastroparesis due to DM (HCC) 10/01/2015   Personal history of noncompliance with medical treatment, presenting hazards to health 09/14/2015   Hypokalemia 11/13/2012   Constipation 11/09/2012   Facial cellulitis 11/07/2012   Leukocytosis 11/07/2012   Type 2 diabetes mellitus (HCC) 11/07/2012   Asthma 11/07/2012   PVD (peripheral vascular disease) (HCC) 11/07/2012   Hyponatremia 11/07/2012   Tachycardia 11/07/2012   Nausea & vomiting 11/07/2012   Essential hypertension, benign     ONSET DATE: 07/19/22 - Cervical Fusion of C5-T1  REFERRING DIAG: Cervical Myopathy with UE weakness  THERAPY DIAG:  Other lack of coordination  Other symptoms and signs involving the nervous system  Rationale for Evaluation and Treatment: Rehabilitation  SUBJECTIVE:   SUBJECTIVE STATEMENT: S: "The right arm is pretty good, the left can be numb and hard to control." Pt accompanied by: self and family member  PERTINENT HISTORY: hx of cervical myelopathy s/p C5-T1 decompression lamintomy and PCDF 07/19/22, hx of COPD, HTN, orthostatic hypotension, DM,PVD, CIR from 07/22/22 -08/05/22   PRECAUTIONS: Cervical  WEIGHT BEARING RESTRICTIONS: Yes <10lbs  PAIN:  Are you having pain? Yes: NPRS scale: 5/10 Pain location: neck Pain description: sharp and radiating Aggravating factors: a lot of movement and sitting unsupported Relieving factors: support and positioning  FALLS: Has patient fallen in last 6 months? No  LIVING ENVIRONMENT: Lives with: lives with their daughter Lives in: House/apartment Stairs: Yes: External: 5 steps; can reach both Has following equipment at home: Single point cane, Walker - 2 wheeled, Wheelchair (manual), shower chair, bed side commode, and Grab bars  PLOF: Independent with basic ADLs and Independent with household mobility with device  PATIENT GOALS: "To get independent"  OBJECTIVE:   HAND  DOMINANCE: Right  ADLs: Overall ADLs: 75% assist with all dressing and bathing, unable to step into shower, has to sponge bathe. Total assist with all IADL's.  MOBILITY STATUS: Needs Assist: min to mod assist  POSTURE COMMENTS:  rounded shoulders, forward head, and increased thoracic kyphosis Sitting balance: Sits without UE support up to 30 sec  ACTIVITY TOLERANCE: Activity tolerance: poor activity tolerance with all movements and activities.  FUNCTIONAL OUTCOME MEASURES: FOTO: 43.76  UPPER EXTREMITY ROM:    Active ROM Right eval Left eval  Shoulder flexion 135 123  Shoulder abduction 143 126  Shoulder internal rotation 90 90  Shoulder external rotation 21 54  (Blank rows = not tested)  UPPER EXTREMITY MMT:     MMT Right eval Left eval  Shoulder flexion 3+/5 3+/5  Shoulder abduction 3+/5 3/5  Shoulder adduction 4/5 4/5  Shoulder extension 4/5 4-/5  Shoulder internal rotation 4+/5 4+/5  Shoulder external rotation 4/5 4-/5  Elbow flexion 4-/5 4/5  Elbow extension 4/5 4/5  Wrist flexion 4/5 4-/5  Wrist extension 4-/5 4-/5  Wrist ulnar deviation 4/5 4/5  Wrist radial deviation 4/5 4/5  Wrist pronation 4+/5 4+/5  Wrist supination 4+/5 4+/5  (Blank rows = not tested)  HAND FUNCTION: Grip strength: Right: 24 lbs; Left: 11  lbs, Lateral pinch: Right: 5 lbs, Left: 1 lbs, and 3 point pinch: Right: 4 lbs, Left: 1 lbs  COORDINATION: 9 Hole Peg test: Right: 1' 14 sec; Left: 3' 16 sec *Limited by vision*  SENSATION: Per pt report RUE gets mildly numb approximately 1-2 times per week, LUE gets severely heavy, numb, and tingly intermittently throughout the day  EDEMA: No swelling noted in UE  COGNITION: Overall cognitive status: Impaired  VISION: Subjective report: I just had 1 eye surgery and I have another one scheduled next month.  Baseline vision: Wears glasses all the time Visual history: retinopathy  OBSERVATIONS: Pt presents with UE tremors and  ataxia.   TODAY'S TREATMENT:                                                                                                                              DATE:  11/02/22 -A/ROM: shoulder, supine-protraction, flexion, abduction, horizontal abduction, 10 reps -Proximal shoulder strengthening: supine-paddles, criss cross, circles to the inside, 10 reps, 2 rest breaks -Hand gripper: right hand, 20#, large and medium beads horizontal; left hand 7#, large beads horizontal -Pinch tree: pt using right hand and 3 point pinch to place red clothespins along middle and top horizontal bar of pinch tree; attempted with left hand, only able to place 2 yellow clothespins before too fatigued to continue   PATIENT EDUCATION: Education details: reviewed HEP, complete in supine when neck is hurting Person educated: Patient Education method: Explanation, Demonstration, and Handouts Education comprehension: verbalized understanding and returned demonstration  HOME EXERCISE PROGRAM: 8/15: A/ROM   GOALS: Goals reviewed with patient? Yes  SHORT TERM GOALS: Target date: 12/16/22  Pt will be provided and educated on HEP to improve BUE mobility for independent ADL's.   Goal status: IN PROGRESS  2.  Pt will decrease BUE fascial restrictions to minimal amounts or less, in order to complete reaching tasks.   Goal status: IN PROGRESS  3.  Pt will increase BUE ROM to 150 flexion and abduction, as well as 55 external rotation, in order to reach overhead and behind her back during dressing and bathing tasks.   Goal status: IN PROGRESS  4.  Pt will increase BUE strength to 4+/5 in order to lift and carry items during cooking and cleaning tasks.   Goal status: IN PROGRESS  5.  Pt will increase grip strength by 7# and pinch strength by 3# in order to grip and carry items for grooming and bathing tasks.   Goal status: IN PROGRESS  6.  Pt will increase BUE 9 hole peg test  to 45 seconds or less in order to  improve coordination to manipulate and utilize buttons, zippers, and hooks during dressing tasks.   Goal status: IN PROGRESS   ASSESSMENT:  CLINICAL IMPRESSION: Pt reports she is trying to do her HEP, provided extra copy as she is unsure if she has the printout from evaluation. Initiated BUE ROM/strengthening, pt completing in supine  as she has less pain versus sitting up. Pt completing grip and pinch strengthening, OT noting muscle atrophy in thenar eminence and discussing with pt and daughter. Completed 9 hole peg test, pt limited due to vision deficits (retina tear and scar tissue) as well as some coordination deficits. Verbal and visual cuing for form and technique during tasks.   PERFORMANCE DEFICITS: in functional skills including ADLs, IADLs, coordination, dexterity, sensation, ROM, strength, pain, fascial restrictions, Fine motor control, Gross motor control, balance, body mechanics, endurance, and UE functional use, cognitive skills including energy/drive, memory, problem solving, and safety awareness, and psychosocial skills including environmental adaptation, interpersonal interactions, and routines and behaviors.      PLAN:  OT FREQUENCY: 2x/week  OT DURATION: 6 weeks  PLANNED INTERVENTIONS: self care/ADL training, therapeutic exercise, therapeutic activity, neuromuscular re-education, manual therapy, passive range of motion, functional mobility training, electrical stimulation, moist heat, patient/family education, energy conservation, coping strategies training, and DME and/or AE instructions  RECOMMENDED OTHER SERVICES: PT   CONSULTED AND AGREED WITH PLAN OF CARE: Patient and family member/caregiver  PLAN FOR NEXT SESSION: AA/ROM, A/ROM, Coordination, Grip and Pinch strengthening, UE strengthening   Ezra Sites, OTR/L  [phone] 11/02/2022, 11:23 AM

## 2022-11-02 NOTE — Patient Instructions (Signed)
Repeat all exercises 10-15 times, 1-2 times per day.  1) Shoulder Protraction    Begin with elbows by your side, slowly "punch" straight out in front of you.      2) Shoulder Flexion  Supine:     Standing:         Begin with arms at your side with thumbs pointed up, slowly raise both arms up and forward towards overhead.               3) Horizontal abduction/adduction  Supine:   Standing:           Begin with arms straight out in front of you, bring out to the side in at "T" shape. Keep arms straight entire time.     4) Shoulder Abduction  Supine:     Standing:       Lying on your back begin with your arms flat on the table next to your side. Slowly move your arms out to the side so that they go overhead, in a jumping jack or snow angel movement.     

## 2022-11-02 NOTE — Therapy (Signed)
OUTPATIENT PHYSICAL THERAPY LOWER EXTREMITY TREATMENT   Patient Name: BELLAMAE BIGNELL MRN: [id number] DOB:21-Feb-1966, 57 y.o., female Today's Date: 11/02/2022  END OF SESSION:  PT End of Session - 11/02/22 0936     Visit Number 3    Number of Visits 12    Date for PT Re-Evaluation 12/08/22    Authorization Type Primary: [employer] Secondary: Medicaid [employer]    PT Start Time 0945    PT Stop Time 1025    PT Time Calculation (min) 40 min    Activity Tolerance Patient tolerated treatment well    Behavior During Therapy WFL for tasks assessed/performed              Past Medical History:  Diagnosis Date   Asthma    COPD (chronic obstructive pulmonary disease) (HCC)    DDD (degenerative disc disease), lumbar    Depression    Diabetes mellitus    Gastroparesis    GERD without esophagitis    High cholesterol    Hypertension    Hypothyroidism    PONV (postoperative nausea and vomiting)    PVD (peripheral vascular disease) (HCC)    Wears glasses    Past Surgical History:  Procedure Laterality Date   AORTA - BILATERAL FEMORAL ARTERY BYPASS GRAFT N/A 09/27/2018   Procedure: Redo Exposure  AORTOBIFEMORAL Bypass and bilateral femoral Artery ; Redo Aortobifemoral  BYPASS GRAFT.;  Surgeon: Cephus Shelling, MD;  Location: [facility] OR;  Service: Vascular;  Laterality: N/A;   BACK SURGERY     BIOPSY  10/14/2021   Procedure: BIOPSY;  Surgeon: Lanelle Bal, DO;  Location: [facility];  Service: Endoscopy;;   COLONOSCOPY WITH PROPOFOL N/A 10/14/2021   Procedure: COLONOSCOPY WITH PROPOFOL;  Surgeon: Lanelle Bal, DO;  Location: [facility];  Service: Endoscopy;  Laterality: N/A;  8:30am   ESOPHAGOGASTRODUODENOSCOPY (EGD) WITH PROPOFOL N/A 10/14/2021   Procedure: ESOPHAGOGASTRODUODENOSCOPY (EGD) WITH PROPOFOL;  Surgeon: Lanelle Bal, DO;  Location: [facility];  Service: Endoscopy;  Laterality: N/A;   FEMORAL ARTERY STENT  right leg   INJECTION OF SILICONE OIL Left  09/06/2022   Procedure: INJECTION OF SILICONE OIL;  Surgeon: Carmela Rima, MD;  Location: [facility] OR;  Service: Ophthalmology;  Laterality: Left;   MEMBRANE PEEL Left 09/06/2022   Procedure: MEMBRANECTOMY;  Surgeon: Carmela Rima, MD;  Location: [facility] OR;  Service: Ophthalmology;  Laterality: Left;   PARS PLANA VITRECTOMY Left 09/06/2022   Procedure: PARS PLANA VITRECTOMY WITH 25 GAUGE;  Surgeon: Carmela Rima, MD;  Location: [facility] OR;  Service: Ophthalmology;  Laterality: Left;   PHOTOCOAGULATION WITH LASER Left 09/06/2022   Procedure: PHOTOCOAGULATION WITH LASER;  Surgeon: Carmela Rima, MD;  Location: [facility] OR;  Service: Ophthalmology;  Laterality: Left;   POSTERIOR CERVICAL FUSION/FORAMINOTOMY N/A 07/19/2022   Procedure: Posterior cervical fusion with lateral mass fixation - Cervical four-cervical five, Cervical five-Cervical six - Cervical six-Cervical seven - Cervical seven-Thoracic one with laminectomy;  Surgeon: Julio Sicks, MD;  Location: [facility] OR;  Service: Neurosurgery;  Laterality: N/A;   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   Procedure: REPAIR OF COMPLEX TRACTION RETINAL DETACHMENT;  Surgeon: Carmela Rima, MD;  Location: [facility] OR;  Service: Ophthalmology;  Laterality: Left;  MAC WITH BLOCK   TUBAL LIGATION     Patient Active Problem List   Diagnosis Date Noted   Post-operative pain 08/15/2022   Orthostatic hypotension 08/15/2022   Impaired gait and mobility 08/15/2022   Mild cognitive impairment 08/01/2022  Cervical myelopathy (HCC) 07/22/2022   Stenosis of cervical spine with myelopathy (HCC) 07/19/2022   Degeneration of cervical intervertebral disc 08/08/2021   Syncope and collapse 08/06/2021   Hypomagnesemia 08/06/2021   Hypothyroidism 08/06/2021   Allergic rhinitis 08/06/2021   AKI (acute kidney injury) (HCC) 08/06/2021   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/27/2018   Aortobifemoral bypass graft  thrombosis (HCC) 09/04/2018   Gastritis 10/01/2015   Gastroparesis due to DM (HCC) 10/01/2015   Personal history of noncompliance with medical treatment, presenting hazards to health 09/14/2015   Hypokalemia 11/13/2012   Constipation 11/09/2012   Facial cellulitis 11/07/2012   Leukocytosis 11/07/2012   Type 2 diabetes mellitus (HCC) 11/07/2012   Asthma 11/07/2012   PVD (peripheral vascular disease) (HCC) 11/07/2012   Hyponatremia 11/07/2012   Tachycardia 11/07/2012   Nausea & vomiting 11/07/2012   Essential hypertension, benign     PCP: Assunta Found MD  REFERRING PROVIDER: Genice Rouge, MD  REFERRING DIAG: G95.9 (ICD-10-CM) - Cervical myelopathy (HCC); Cervical myelopathy- with UE and LE weakness- since Cervical decompression and fusion- C5-T1-  THERAPY DIAG:  Other abnormalities of gait and mobility  Other lack of coordination  Rationale for Evaluation and Treatment: Rehabilitation  ONSET DATE: DOS 07/19/22 Next apt  Dr Jordan Likes spinal surgeon 11/04/22  SUBJECTIVE:   SUBJECTIVE STATEMENT: Pt arrived with daughter, still in [facility] with extreme forward bent posturing of neck and spine.  Reports increased compliance with HEP  but not walking at home as she should due to her eyesight. PT has Rt eye surgery on 11/15/22.    Eval:  Patient states she fell last year and had issues with mobility following. She then had surgery to fix myelopathy and went to inpatient rehab. Patient states she can walk with a RW now but balance is not good. She is limited with her walking ability due to weakness and endurance. She has trouble with steps to get in/out of the house. Left leg wants to fold up under her sometimes with walking.   PERTINENT HISTORY: hx of cervical myelopathy s/p C5-T1 decompression lamintomy and PCDF 07/19/22, hx of COPD, HTN, orthostatic hypotension, DM,PVD, CIR from 07/22/22 -08/05/22 PAIN:  Are you having pain? Yes: NPRS scale: 4-5/10 Pain location: neck Pain description:  hurt Aggravating factors: sitting Relieving factors: meds, movement sometimes  PRECAUTIONS: Fall and Other: possible cervical ?  WEIGHT BEARING RESTRICTIONS: No  FALLS:  Has patient fallen in last 6 months? No  OCCUPATION: disabled  PLOF: Independent  PATIENT GOALS: to walk   OBJECTIVE: (objective measures from initial evaluation unless otherwise dated)   COGNITION: Overall cognitive status: Within functional limits for tasks assessed     SENSATION: WFL    POSTURE: rounded shoulders, forward head, and increased thoracic kyphosis(in neck brace - soft collar)    LOWER EXTREMITY ROM: WFL for tasks assessed   LOWER EXTREMITY MMT:  MMT Right eval Left eval  Hip flexion 4 4-  Hip extension    Hip abduction    Hip adduction    Hip internal rotation    Hip external rotation    Knee flexion 4- 4  Knee extension 4 4  Ankle dorsiflexion 4 3+  Ankle plantarflexion    Ankle inversion    Ankle eversion     (Blank rows = not tested) *= pain/symptoms    FUNCTIONAL TESTS:  5 times sit to stand: 36.13 seconds with UE support Timed up and go (TUG): 37.46 seconds with RW  2 minute walk test: 75 feet with RW, had to stop at 1 minute due to fatigue   GAIT: Distance walked: 100 feet Assistive device utilized: Walker - 2 wheeled Level of assistance: CGA Comments: ; trunk flexed,  had to stop at 1 minute due to fatigue   TODAY'S TREATMENT:                                                                                                                              DATE:  11/02/22 Ambulation with RW 88', 92' with rest between Standing:  marching alternating 8X  Heelraises 8X  Hip abduction 5X each  Hip extension 5X each Sitting:  sit to stands with each exercise 10X approx  LAQ 10X each LE  Alternating march 10X each  Cervical holding upright in seated 5X 5" holds  Cervical rotation 5X each Supine:  bridge 10X  SLR 10X each  Hip abduction with RTB  10X5"   10/31/22: Reviewed goals Educated importance of HEP complaince Pt able to recall, admits only doing LAQ currently not began STS yet  STS 6 times prior fatigue with HHA LAQ 4 times prior fatigue Seated scapular retraction 10x 5" Standing: March alternating 8x with HHA Heel raises 7x with UE support  Ambulate RW 41ft  Nustep UE/LE x 3 min prior fatigue   10/27/22 Eval and HEP     PATIENT EDUCATION:  Education details: Patient educated on exam findings, POC, scope of PT, HEP, Person educated: Patient Education method: Programmer, multimedia, Demonstration, and Handouts Education comprehension: verbalized understanding, returned demonstration, verbal cues required, and tactile cues required  HOME EXERCISE PROGRAM: Access Code: [email or web address] URL: [email or web address]   Date: 10/27/2022 - Sit to Stand  - 3 x daily - 7 x weekly - 2 sets - 10 reps - Seated Long Arc Quad  - 3 x daily - 7 x weekly - 2 sets - 10 reps - 5-10 second hold  10/31/22: -seated posture -scapular retraction  Date: 11/02/2022 - Seated March  - 3 x daily - 7 x weekly - 2 sets - 10 reps - Supine Bridge  - 3 x daily - 7 x weekly - 2 sets - 10 reps - Hooklying Clamshell with Resistance  - 3 x daily - 7 x weekly - 2 sets - 10 reps - 5 sec hold - Supine Active Straight Leg Raise  - 3 x daily - 7 x weekly - 2 sets - 10 reps  ASSESSMENT:  CLINICAL IMPRESSION: Session focus with strengthening and activities to improve activity tolerance.  Began with ambulation, in which she was able to complete further distance each time before resting as compared to last visit.  Standing exercises repeated as added last session.  Pt easily fatigued with inability to complete only 5-8 reps before having to take a seated rest break.  Hip abduction added in standing with inability to complete in correct form, using hip flexor rather than abductor.  Overall, poor  posture with extreme forward head and rounded shoulders with  brace on neck while standing.  Pt tends to use UE to hold head up when seated in wheelchair.  Educated pt and daughter on importance of using these muscles and activity using neck mm as the brace allows these mm to rest and get weaker.  Added mat exercises for LE strengthening with increased tolerance and ability to complete full 10 reps of each.  These added to HEP as well as seated march.  No reports of pain through session.  Eval:  Patient a 57 y.o. y.o. female who was seen today for physical therapy evaluation and treatment for weakness due to hx of cervical myelopathy. Patient presents with pain limited deficits in bilateral LE strength, ROM, endurance, activity tolerance, gait, balance, posture,  and functional mobility with ADL. Patient is having to modify and restrict ADL as indicated by subjective information and objective measures which is affecting overall participation. Patient will benefit from skilled physical therapy in order to improve function and reduce impairment.  Returns to [place] 8/23 in hopes to have brace removed.   OBJECTIVE IMPAIRMENTS: decreased activity tolerance, decreased balance, decreased endurance, decreased mobility, difficulty walking, decreased ROM, decreased strength, increased muscle spasms, impaired flexibility, improper body mechanics, postural dysfunction, and pain.   ACTIVITY LIMITATIONS: carrying, lifting, bending, standing, squatting, stairs, transfers, locomotion level, and caring for others  PARTICIPATION LIMITATIONS: meal prep, cleaning, laundry, shopping, community activity, and yard work  PERSONAL FACTORS: Fitness, Time since onset of injury/illness/exacerbation, and 3+ comorbidities: hx cervical myelopathy from fall, cervical fusion C5-T1, COPD, DM, HTN, PVD  are also affecting patient's functional outcome.   REHAB POTENTIAL: Good  CLINICAL DECISION MAKING: Evolving/moderate complexity  EVALUATION COMPLEXITY: Moderate   GOALS: Goals reviewed with  patient? Yes  SHORT TERM GOALS: Target date: 11/17/2022    Patient will be independent with HEP in order to improve functional outcomes. Baseline: Goal status: IN PROGRESS  2.  Patient will report at least 25% improvement in symptoms for improved quality of life. Baseline: Goal status: IN PROGRESS    LONG TERM GOALS: Target date: 12/08/2022    Timed up and go (TUG): 37.46 seconds with RW  Patient will report at least 75% improvement in symptoms for improved quality of life. Baseline:  Goal status: IN PROGRESS  2.  Patient will improve Timed up and go (TUG) time to less than 20 seconds in order to improved safe ambulation ability. Baseline: 37.46 seconds with RW Goal status: IN PROGRESS  3.  Patient will be able to navigate stairs with reciprocal pattern without compensation in order to demonstrate improved LE strength. Baseline: patient states impaired and requires assistance Goal status: IN PROGRESS  4.  Patient will be able to ambulate at least 226 feet in in order to demonstrate improved tolerance to activity. Baseline: 75 feet with RW, had to stop at 1 minute due to fatigue Goal status: IN PROGRESS  5.  Patient will be able to complete 5x STS in under 20 seconds in order to reduce the risk of falls. Baseline: 36.13 seconds with UE support Goal status: IN PROGRESS  6.  Patient will demonstrate grade of 4+/5 MMT grade in all tested musculature as evidence of improved strength to assist with stair ambulation and gait.   Baseline: see above Goal status: IN PROGRESS   PLAN:  PT FREQUENCY: 2x/week  PT DURATION: 6 weeks  PLANNED INTERVENTIONS: Therapeutic exercises, Therapeutic activity, Neuromuscular re-education, Balance training, Gait training, Patient/Family education, Joint  manipulation, Joint mobilization, Stair training, Orthotic/Fit training, DME instructions, Aquatic Therapy, Dry Needling, Electrical stimulation, Spinal manipulation, Spinal mobilization,  Cryotherapy, Moist heat, Compression bandaging, scar mobilization, Splintting, Taping, Traction, Ultrasound, Ionotophoresis 4mg /ml Dexamethasone, and Manual therapy  PLAN FOR NEXT SESSION: f/u with brace following MD appt.  Continue to increase bilateral LE/functional strengthening.  Increase activity tolerance, posture and cervical strength.  Pt is also seeing OT for UE strengthening.  RTM regarding cervical brace 8/23, Rt eye surgery 9/3.   Emeline Gins B, PTA 11/02/2022, 10:41 AM

## 2022-11-04 DIAGNOSIS — M546 Pain in thoracic spine: Secondary | ICD-10-CM | POA: Diagnosis not present

## 2022-11-04 DIAGNOSIS — M4802 Spinal stenosis, cervical region: Secondary | ICD-10-CM | POA: Diagnosis not present

## 2022-11-07 ENCOUNTER — Encounter (HOSPITAL_COMMUNITY): Payer: 59

## 2022-11-08 ENCOUNTER — Other Ambulatory Visit: Payer: Self-pay | Admitting: Student

## 2022-11-08 DIAGNOSIS — M546 Pain in thoracic spine: Secondary | ICD-10-CM

## 2022-11-09 ENCOUNTER — Ambulatory Visit (HOSPITAL_COMMUNITY): Payer: 59 | Admitting: Physical Therapy

## 2022-11-09 ENCOUNTER — Encounter: Payer: Self-pay | Admitting: Student

## 2022-11-09 DIAGNOSIS — M6281 Muscle weakness (generalized): Secondary | ICD-10-CM | POA: Diagnosis not present

## 2022-11-09 DIAGNOSIS — R2689 Other abnormalities of gait and mobility: Secondary | ICD-10-CM | POA: Diagnosis not present

## 2022-11-09 DIAGNOSIS — R278 Other lack of coordination: Secondary | ICD-10-CM | POA: Diagnosis not present

## 2022-11-09 DIAGNOSIS — G959 Disease of spinal cord, unspecified: Secondary | ICD-10-CM | POA: Diagnosis not present

## 2022-11-09 DIAGNOSIS — R29818 Other symptoms and signs involving the nervous system: Secondary | ICD-10-CM | POA: Diagnosis not present

## 2022-11-09 NOTE — Therapy (Signed)
OUTPATIENT PHYSICAL THERAPY LOWER EXTREMITY TREATMENT   Patient Name: Alexandria Webster MRN: [id number] DOB:Dec 27, 1965, 57 y.o., female Today's Date: 11/09/2022  END OF SESSION:  PT End of Session - 11/09/22 1030     Visit Number 4    Number of Visits 12    Date for PT Re-Evaluation 12/08/22    Authorization Type Primary: [employer] Secondary: Medicaid [employer]    PT Start Time 1030    PT Stop Time 1110    PT Time Calculation (min) 40 min    Activity Tolerance Patient tolerated treatment well    Behavior During Therapy WFL for tasks assessed/performed              Past Medical History:  Diagnosis Date   Asthma    COPD (chronic obstructive pulmonary disease) (HCC)    DDD (degenerative disc disease), lumbar    Depression    Diabetes mellitus    Gastroparesis    GERD without esophagitis    High cholesterol    Hypertension    Hypothyroidism    PONV (postoperative nausea and vomiting)    PVD (peripheral vascular disease) (HCC)    Wears glasses    Past Surgical History:  Procedure Laterality Date   AORTA - BILATERAL FEMORAL ARTERY BYPASS GRAFT N/A 09/27/2018   Procedure: Redo Exposure  AORTOBIFEMORAL Bypass and bilateral femoral Artery ; Redo Aortobifemoral  BYPASS GRAFT.;  Surgeon: Cephus Shelling, MD;  Location: [facility] OR;  Service: Vascular;  Laterality: N/A;   BACK SURGERY     BIOPSY  10/14/2021   Procedure: BIOPSY;  Surgeon: Lanelle Bal, DO;  Location: [facility];  Service: Endoscopy;;   COLONOSCOPY WITH PROPOFOL N/A 10/14/2021   Procedure: COLONOSCOPY WITH PROPOFOL;  Surgeon: Lanelle Bal, DO;  Location: [facility];  Service: Endoscopy;  Laterality: N/A;  8:30am   ESOPHAGOGASTRODUODENOSCOPY (EGD) WITH PROPOFOL N/A 10/14/2021   Procedure: ESOPHAGOGASTRODUODENOSCOPY (EGD) WITH PROPOFOL;  Surgeon: Lanelle Bal, DO;  Location: [facility];  Service: Endoscopy;  Laterality: N/A;   FEMORAL ARTERY STENT  right leg   INJECTION OF SILICONE OIL Left  09/06/2022   Procedure: INJECTION OF SILICONE OIL;  Surgeon: Carmela Rima, MD;  Location: [facility] OR;  Service: Ophthalmology;  Laterality: Left;   MEMBRANE PEEL Left 09/06/2022   Procedure: MEMBRANECTOMY;  Surgeon: Carmela Rima, MD;  Location: [facility] OR;  Service: Ophthalmology;  Laterality: Left;   PARS PLANA VITRECTOMY Left 09/06/2022   Procedure: PARS PLANA VITRECTOMY WITH 25 GAUGE;  Surgeon: Carmela Rima, MD;  Location: [facility] OR;  Service: Ophthalmology;  Laterality: Left;   PHOTOCOAGULATION WITH LASER Left 09/06/2022   Procedure: PHOTOCOAGULATION WITH LASER;  Surgeon: Carmela Rima, MD;  Location: [facility] OR;  Service: Ophthalmology;  Laterality: Left;   POSTERIOR CERVICAL FUSION/FORAMINOTOMY N/A 07/19/2022   Procedure: Posterior cervical fusion with lateral mass fixation - Cervical four-cervical five, Cervical five-Cervical six - Cervical six-Cervical seven - Cervical seven-Thoracic one with laminectomy;  Surgeon: Julio Sicks, MD;  Location: [facility] OR;  Service: Neurosurgery;  Laterality: N/A;   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   Procedure: REPAIR OF COMPLEX TRACTION RETINAL DETACHMENT;  Surgeon: Carmela Rima, MD;  Location: [facility] OR;  Service: Ophthalmology;  Laterality: Left;  MAC WITH BLOCK   TUBAL LIGATION     Patient Active Problem List   Diagnosis Date Noted   Post-operative pain 08/15/2022   Orthostatic hypotension 08/15/2022   Impaired gait and mobility 08/15/2022   Mild cognitive impairment 08/01/2022  Cervical myelopathy (HCC) 07/22/2022   Stenosis of cervical spine with myelopathy (HCC) 07/19/2022   Degeneration of cervical intervertebral disc 08/08/2021   Syncope and collapse 08/06/2021   Hypomagnesemia 08/06/2021   Hypothyroidism 08/06/2021   Allergic rhinitis 08/06/2021   AKI (acute kidney injury) (HCC) 08/06/2021   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/27/2018   Aortobifemoral bypass graft  thrombosis (HCC) 09/04/2018   Gastritis 10/01/2015   Gastroparesis due to DM (HCC) 10/01/2015   Personal history of noncompliance with medical treatment, presenting hazards to health 09/14/2015   Hypokalemia 11/13/2012   Constipation 11/09/2012   Facial cellulitis 11/07/2012   Leukocytosis 11/07/2012   Type 2 diabetes mellitus (HCC) 11/07/2012   Asthma 11/07/2012   PVD (peripheral vascular disease) (HCC) 11/07/2012   Hyponatremia 11/07/2012   Tachycardia 11/07/2012   Nausea & vomiting 11/07/2012   Essential hypertension, benign     PCP: Assunta Found MD  REFERRING PROVIDER: Genice Rouge, MD  REFERRING DIAG: G95.9 (ICD-10-CM) - Cervical myelopathy (HCC); Cervical myelopathy- with UE and LE weakness- since Cervical decompression and fusion- C5-T1-  THERAPY DIAG:  Other lack of coordination  Other symptoms and signs involving the nervous system  Other abnormalities of gait and mobility  Rationale for Evaluation and Treatment: Rehabilitation  ONSET DATE: DOS 07/19/22 Next apt  Dr Jordan Likes spinal surgeon 11/04/22  SUBJECTIVE:   SUBJECTIVE STATEMENT: Pt states MD appt completed and wants her to continue wearing the soft cervical collar.  Going 9/5 for MRI of lower back.  Pt arrived with daughter in [facility] and brought her personal walker from home.  Improved cervical posturing today with less static forward bent posturing of neck and spine.  States she's been walking at home and doing her exercises. PT has Rt eye surgery on 11/15/22.  0/10 neck pain, 2/10 lower spine today  Eval:  Patient states she fell last year and had issues with mobility following. She then had surgery to fix myelopathy and went to inpatient rehab. Patient states she can walk with a RW now but balance is not good. She is limited with her walking ability due to weakness and endurance. She has trouble with steps to get in/out of the house. Left leg wants to fold up under her sometimes with walking.   PERTINENT HISTORY: hx  of cervical myelopathy s/p C5-T1 decompression lamintomy and PCDF 07/19/22, hx of COPD, HTN, orthostatic hypotension, DM,PVD, CIR from 07/22/22 -08/05/22 PAIN:  Are you having pain? No  PRECAUTIONS: Fall and Other: possible cervical ?  WEIGHT BEARING RESTRICTIONS: No  FALLS:  Has patient fallen in last 6 months? No  OCCUPATION: disabled  PLOF: Independent  PATIENT GOALS: to walk   OBJECTIVE: (objective measures from initial evaluation unless otherwise dated)   COGNITION: Overall cognitive status: Within functional limits for tasks assessed     SENSATION: WFL    POSTURE: rounded shoulders, forward head, and increased thoracic kyphosis(in neck brace - soft collar)    LOWER EXTREMITY ROM: WFL for tasks assessed   LOWER EXTREMITY MMT:  MMT Right eval Left eval  Hip flexion 4 4-  Hip extension    Hip abduction    Hip adduction    Hip internal rotation    Hip external rotation    Knee flexion 4- 4  Knee extension 4 4  Ankle dorsiflexion 4 3+  Ankle plantarflexion    Ankle inversion    Ankle eversion     (Blank rows = not tested) *=  pain/symptoms    FUNCTIONAL TESTS:  5 times sit to stand: 36.13 seconds with UE support Timed up and go (TUG): 37.46 seconds with RW 2 minute walk test: 75 feet with RW, had to stop at 1 minute due to fatigue   GAIT: Distance walked: 100 feet Assistive device utilized: Walker - 2 wheeled Level of assistance: CGA Comments: ; trunk flexed,  had to stop at 1 minute due to fatigue   TODAY'S TREATMENT:                                                                                                                              DATE:  11/09/22 Ambulation with RW 120', 75', 60' with rest between Standing:  all completed with bil UE assist marching alternating 10X  Heelraises 15X  Hip abduction 10X each  Hip extension 10X each Sitting:  sit to stands 5X 2 sets no UE from standard chair  LAQ 10X each LE  Cervical ROM all  directions 5X each  11/02/22 Ambulation with RW 88', 92' with rest between Standing:  marching alternating 8X  Heelraises 8X  Hip abduction 5X each  Hip extension 5X each Sitting:  sit to stands with each exercise 10X approx  LAQ 10X each LE  Alternating march 10X each  Cervical holding upright in seated 5X 5" holds  Cervical rotation 5X each Supine:  bridge 10X  SLR 10X each  Hip abduction with RTB 10X5"   10/31/22: Reviewed goals Educated importance of HEP complaince Pt able to recall, admits only doing LAQ currently not began STS yet  STS 6 times prior fatigue with HHA LAQ 4 times prior fatigue Seated scapular retraction 10x 5" Standing: March alternating 8x with HHA Heel raises 7x with UE support  Ambulate RW 73ft  Nustep UE/LE x 3 min prior fatigue   10/27/22 Eval and HEP     PATIENT EDUCATION:  Education details: Patient educated on exam findings, POC, scope of PT, HEP, Person educated: Patient Education method: Programmer, multimedia, Demonstration, and Handouts Education comprehension: verbalized understanding, returned demonstration, verbal cues required, and tactile cues required  HOME EXERCISE PROGRAM: Access Code: [email or web address] URL: [email or web address]   Date: 10/27/2022 - Sit to Stand  - 3 x daily - 7 x weekly - 2 sets - 10 reps - Seated Long Arc Quad  - 3 x daily - 7 x weekly - 2 sets - 10 reps - 5-10 second hold  10/31/22: -seated posture -scapular retraction  Date: 11/02/2022 - Seated March  - 3 x daily - 7 x weekly - 2 sets - 10 reps - Supine Bridge  - 3 x daily - 7 x weekly - 2 sets - 10 reps - Hooklying Clamshell with Resistance  - 3 x daily - 7 x weekly - 2 sets - 10 reps - 5 sec hold - Supine Active Straight Leg Raise  - 3 x daily - 7 x weekly - 2 sets - 10 reps  ASSESSMENT:  CLINICAL IMPRESSION: Continued focus on LE strengthening and activities to improve activity tolerance.  Began with ambulation with much improved gait quality  and power with transfer. Standing exercises repeated as added last session.  Pt with less fatigue and ability to complete full set of 10 reps of all exercises before needing rest break today.  Hip abduction completed with improved form and less cues needed. Posture remains poor with forward head and rounded shoulders with brace on neck while standing.  Pt is able to maintain upright posturing longer following cues as was last visit. Encouraged to discontinue the wheelchair and use the walker to come to therapy next session.  Educated on posture, specifically seated posture and to to use a pillow/roll in lower back to reduce kyphosis when seated. Again, educated pt and daughter on importance of working cervical muscles as brace allows these mm to rest and get weaker.   No reports of pain through session.  Eval:  Patient a 57 y.o. y.o. female who was seen today for physical therapy evaluation and treatment for weakness due to hx of cervical myelopathy. Patient presents with pain limited deficits in bilateral LE strength, ROM, endurance, activity tolerance, gait, balance, posture,  and functional mobility with ADL. Patient is having to modify and restrict ADL as indicated by subjective information and objective measures which is affecting overall participation. Patient will benefit from skilled physical therapy in order to improve function and reduce impairment.  Returns to MD 8/23 in hopes to have brace removed.   OBJECTIVE IMPAIRMENTS: decreased activity tolerance, decreased balance, decreased endurance, decreased mobility, difficulty walking, decreased ROM, decreased strength, increased muscle spasms, impaired flexibility, improper body mechanics, postural dysfunction, and pain.   ACTIVITY LIMITATIONS: carrying, lifting, bending, standing, squatting, stairs, transfers, locomotion level, and caring for others  PARTICIPATION LIMITATIONS: meal prep, cleaning, laundry, shopping, community activity, and yard  work  PERSONAL FACTORS: Fitness, Time since onset of injury/illness/exacerbation, and 3+ comorbidities: hx cervical myelopathy from fall, cervical fusion C5-T1, COPD, DM, HTN, PVD  are also affecting patient's functional outcome.   REHAB POTENTIAL: Good  CLINICAL DECISION MAKING: Evolving/moderate complexity  EVALUATION COMPLEXITY: Moderate   GOALS: Goals reviewed with patient? Yes  SHORT TERM GOALS: Target date: 11/17/2022    Patient will be independent with HEP in order to improve functional outcomes. Baseline: Goal status: IN PROGRESS  2.  Patient will report at least 25% improvement in symptoms for improved quality of life. Baseline: Goal status: IN PROGRESS    LONG TERM GOALS: Target date: 12/08/2022    Timed up and go (TUG): 37.46 seconds with RW  Patient will report at least 75% improvement in symptoms for improved quality of life. Baseline:  Goal status: IN PROGRESS  2.  Patient will improve Timed up and go (TUG) time to less than 20 seconds in order to improved safe ambulation ability. Baseline: 37.46 seconds with RW Goal status: IN PROGRESS  3.  Patient will be able to navigate stairs with reciprocal pattern without compensation in order to demonstrate improved LE strength. Baseline: patient states impaired and requires assistance Goal status: IN PROGRESS  4.  Patient will be able to ambulate at least 226 feet in in order to demonstrate improved tolerance to activity. Baseline: 75 feet with RW, had to stop at 1 minute due to fatigue Goal status: IN PROGRESS  5.  Patient will be able to complete 5x STS in under 20 seconds in order to reduce the risk of falls. Baseline: 36.13  seconds with UE support Goal status: IN PROGRESS  6.  Patient will demonstrate grade of 4+/5 MMT grade in all tested musculature as evidence of improved strength to assist with stair ambulation and gait.   Baseline: see above Goal status: IN PROGRESS   PLAN:  PT FREQUENCY:  2x/week  PT DURATION: 6 weeks  PLANNED INTERVENTIONS: Therapeutic exercises, Therapeutic activity, Neuromuscular re-education, Balance training, Gait training, Patient/Family education, Joint manipulation, Joint mobilization, Stair training, Orthotic/Fit training, DME instructions, Aquatic Therapy, Dry Needling, Electrical stimulation, Spinal manipulation, Spinal mobilization, Cryotherapy, Moist heat, Compression bandaging, scar mobilization, Splintting, Taping, Traction, Ultrasound, Ionotophoresis 4mg /ml Dexamethasone, and Manual therapy  PLAN FOR NEXT SESSION: Continue to increase bilateral LE/functional strengthening.  Increase activity tolerance, posture and cervical strength.  Pt is also seeing OT for UE strengthening. Rt eye surgery 9/3.   Lurena Nida, PTA 11/09/2022, 2:34 PM

## 2022-11-10 ENCOUNTER — Encounter: Payer: Self-pay | Admitting: Student

## 2022-11-11 ENCOUNTER — Encounter: Payer: Self-pay | Admitting: Student

## 2022-11-15 ENCOUNTER — Ambulatory Visit: Payer: BLUE CROSS/BLUE SHIELD | Admitting: Nurse Practitioner

## 2022-11-15 ENCOUNTER — Encounter: Payer: Self-pay | Admitting: Student

## 2022-11-16 ENCOUNTER — Encounter (HOSPITAL_COMMUNITY): Payer: 59

## 2022-11-16 ENCOUNTER — Encounter: Payer: Self-pay | Admitting: Student

## 2022-11-16 ENCOUNTER — Encounter (HOSPITAL_COMMUNITY): Payer: 59 | Admitting: Occupational Therapy

## 2022-11-17 ENCOUNTER — Encounter (HOSPITAL_COMMUNITY): Payer: 59 | Admitting: Occupational Therapy

## 2022-11-17 ENCOUNTER — Encounter (HOSPITAL_COMMUNITY): Payer: 59

## 2022-11-17 ENCOUNTER — Other Ambulatory Visit: Payer: 59

## 2022-11-18 ENCOUNTER — Ambulatory Visit (HOSPITAL_COMMUNITY): Payer: 59 | Attending: Physical Medicine and Rehabilitation

## 2022-11-18 ENCOUNTER — Ambulatory Visit (HOSPITAL_COMMUNITY): Payer: 59 | Admitting: Occupational Therapy

## 2022-11-18 ENCOUNTER — Encounter (HOSPITAL_COMMUNITY): Payer: Self-pay | Admitting: Occupational Therapy

## 2022-11-18 DIAGNOSIS — R29818 Other symptoms and signs involving the nervous system: Secondary | ICD-10-CM | POA: Insufficient documentation

## 2022-11-18 DIAGNOSIS — R2689 Other abnormalities of gait and mobility: Secondary | ICD-10-CM | POA: Diagnosis not present

## 2022-11-18 DIAGNOSIS — R278 Other lack of coordination: Secondary | ICD-10-CM | POA: Diagnosis not present

## 2022-11-18 DIAGNOSIS — G959 Disease of spinal cord, unspecified: Secondary | ICD-10-CM | POA: Insufficient documentation

## 2022-11-18 DIAGNOSIS — M6281 Muscle weakness (generalized): Secondary | ICD-10-CM | POA: Diagnosis not present

## 2022-11-18 NOTE — Therapy (Signed)
OUTPATIENT PHYSICAL THERAPY LOWER EXTREMITY TREATMENT   Patient Name: Alexandria Webster MRN: [id number] DOB:1965/05/13, 57 y.o., female Today's Date: 11/18/2022  END OF SESSION:  PT End of Session - 11/18/22 0921     Visit Number 5    Number of Visits 12     Date for PT Re-Evaluation 09/26/2    Authorization Type Primary: [employer] Secondary: Medicaid [employer]      PT Start Time 0900     PT Stop Time 0940    PT Time Calculation (min) 40 min    Equipment Utilized During Treatment Gait belt    Activity Tolerance Patient tolerated treatment well    Behavior During Therapy WFL for tasks assessed/performed              Past Medical History:  Diagnosis Date   Asthma    COPD (chronic obstructive pulmonary disease) (HCC)    DDD (degenerative disc disease), lumbar    Depression    Diabetes mellitus    Gastroparesis    GERD without esophagitis    High cholesterol    Hypertension    Hypothyroidism    PONV (postoperative nausea and vomiting)    PVD (peripheral vascular disease) (HCC)    Wears glasses    Past Surgical History:  Procedure Laterality Date   AORTA - BILATERAL FEMORAL ARTERY BYPASS GRAFT N/A 09/27/2018   Procedure: Redo Exposure  AORTOBIFEMORAL Bypass and bilateral femoral Artery ; Redo Aortobifemoral  BYPASS GRAFT.;  Surgeon: Cephus Shelling, MD;  Location: [facility] OR;  Service: Vascular;  Laterality: N/A;   BACK SURGERY     BIOPSY  10/14/2021   Procedure: BIOPSY;  Surgeon: Lanelle Bal, DO;  Location: [facility];  Service: Endoscopy;;   COLONOSCOPY WITH PROPOFOL N/A 10/14/2021   Procedure: COLONOSCOPY WITH PROPOFOL;  Surgeon: Lanelle Bal, DO;  Location: [facility];  Service: Endoscopy;  Laterality: N/A;  8:30am   ESOPHAGOGASTRODUODENOSCOPY (EGD) WITH PROPOFOL N/A 10/14/2021   Procedure: ESOPHAGOGASTRODUODENOSCOPY (EGD) WITH PROPOFOL;  Surgeon: Lanelle Bal, DO;  Location: [facility];  Service: Endoscopy;  Laterality: N/A;   FEMORAL ARTERY  STENT  right leg   INJECTION OF SILICONE OIL Left 09/06/2022   Procedure: INJECTION OF SILICONE OIL;  Surgeon: Carmela Rima, MD;  Location: [facility] OR;  Service: Ophthalmology;  Laterality: Left;   MEMBRANE PEEL Left 09/06/2022   Procedure: MEMBRANECTOMY;  Surgeon: Carmela Rima, MD;  Location: [facility] OR;  Service: Ophthalmology;  Laterality: Left;   PARS PLANA VITRECTOMY Left 09/06/2022   Procedure: PARS PLANA VITRECTOMY WITH 25 GAUGE;  Surgeon: Carmela Rima, MD;  Location: [facility] OR;  Service: Ophthalmology;  Laterality: Left;   PHOTOCOAGULATION WITH LASER Left 09/06/2022   Procedure: PHOTOCOAGULATION WITH LASER;  Surgeon: Carmela Rima, MD;  Location: [facility] OR;  Service: Ophthalmology;  Laterality: Left;   POSTERIOR CERVICAL FUSION/FORAMINOTOMY N/A 07/19/2022   Procedure: Posterior cervical fusion with lateral mass fixation - Cervical four-cervical five, Cervical five-Cervical six - Cervical six-Cervical seven - Cervical seven-Thoracic one with laminectomy;  Surgeon: Julio Sicks, MD;  Location: [facility] OR;  Service: Neurosurgery;  Laterality: N/A;   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   Procedure: REPAIR OF COMPLEX TRACTION RETINAL DETACHMENT;  Surgeon: Carmela Rima, MD;  Location: [facility] OR;  Service: Ophthalmology;  Laterality: Left;  MAC WITH BLOCK   TUBAL LIGATION     Patient Active Problem List   Diagnosis Date Noted   Post-operative pain 08/15/2022   Orthostatic hypotension 08/15/2022  Impaired gait and mobility 08/15/2022   Mild cognitive impairment 08/01/2022   Cervical myelopathy (HCC) 07/22/2022   Stenosis of cervical spine with myelopathy (HCC) 07/19/2022   Degeneration of cervical intervertebral disc 08/08/2021   Syncope and collapse 08/06/2021   Hypomagnesemia 08/06/2021   Hypothyroidism 08/06/2021   Allergic rhinitis 08/06/2021   AKI (acute kidney injury) (HCC) 08/06/2021   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   Aortoiliac occlusive disease (HCC)  09/27/2018   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   Gastritis 10/01/2015   Gastroparesis due to DM (HCC) 10/01/2015   Personal history of noncompliance with medical treatment, presenting hazards to health 09/14/2015   Hypokalemia 11/13/2012   Constipation 11/09/2012   Facial cellulitis 11/07/2012   Leukocytosis 11/07/2012   Type 2 diabetes mellitus (HCC) 11/07/2012   Asthma 11/07/2012   PVD (peripheral vascular disease) (HCC) 11/07/2012   Hyponatremia 11/07/2012   Tachycardia 11/07/2012   Nausea & vomiting 11/07/2012   Essential hypertension, benign     PCP: Assunta Found MD  REFERRING PROVIDER: Genice Rouge, MD  REFERRING DIAG: G95.9 (ICD-10-CM) - Cervical myelopathy (HCC); Cervical myelopathy- with UE and LE weakness- since Cervical decompression and fusion- C5-T1-  THERAPY DIAG:  Cervical myelopathy (HCC)  Other lack of coordination  Other symptoms and signs involving the nervous system  Other abnormalities of gait and mobility  Muscle weakness (generalized)  Rationale for Evaluation and Treatment: Rehabilitation  ONSET DATE: DOS 07/19/22 Next apt  Dr Jordan Likes spinal surgeon 11/04/22  SUBJECTIVE:   SUBJECTIVE STATEMENT: Patient denies any pain today but claims that she's very tired as she did not sleep good last night. Was supposed to have eye surgery but the facility has to re-schedule it.  Eval:  Patient states she fell last year and had issues with mobility following. She then had surgery to fix myelopathy and went to inpatient rehab. Patient states she can walk with a RW now but balance is not good. She is limited with her walking ability due to weakness and endurance. She has trouble with steps to get in/out of the house. Left leg wants to fold up under her sometimes with walking.   PERTINENT HISTORY: hx of cervical myelopathy s/p C5-T1 decompression lamintomy and PCDF 07/19/22, hx of COPD, HTN, orthostatic hypotension, DM,PVD, CIR from 07/22/22  -08/05/22 PAIN:  Are you having pain? No  PRECAUTIONS: Fall and Other: possible cervical ?  WEIGHT BEARING RESTRICTIONS: No  FALLS:  Has patient fallen in last 6 months? No  OCCUPATION: disabled  PLOF: Independent  PATIENT GOALS: to walk   OBJECTIVE: (objective measures from initial evaluation unless otherwise dated)   COGNITION: Overall cognitive status: Within functional limits for tasks assessed     SENSATION: WFL    POSTURE: rounded shoulders, forward head, and increased thoracic kyphosis(in neck brace - soft collar)    LOWER EXTREMITY ROM: WFL for tasks assessed   LOWER EXTREMITY MMT:  MMT Right eval Left eval  Hip flexion 4 4-  Hip extension    Hip abduction    Hip adduction    Hip internal rotation    Hip external rotation    Knee flexion 4- 4  Knee extension 4 4  Ankle dorsiflexion 4 3+  Ankle plantarflexion    Ankle inversion    Ankle eversion     (Blank rows = not tested) *= pain/symptoms    FUNCTIONAL TESTS:  5 times sit to stand: 36.13 seconds with UE support Timed up and go (TUG): 37.46 seconds with  RW 2 minute walk test: 75 feet with RW, had to stop at 1 minute due to fatigue   GAIT: Distance walked: 100 feet Assistive device utilized: Walker - 2 wheeled Level of assistance: CGA Comments: ; trunk flexed,  had to stop at 1 minute due to fatigue   TODAY'S TREATMENT:                                                                                                                              DATE:  11/18/22 Ambulation with RW CGA 1 lap around the clinic with 2 seated rest breaks (around 75 ft) Standing with B UE support:  Heel raises x 1 lb x 2  x 10  High marches x 1 lb x 2 x 10 Vital signs monitoring  11/09/22 Ambulation with RW 120', 75', 60' with rest between Standing:  all completed with bil UE assist marching alternating 10X  Heelraises 15X  Hip abduction 10X each  Hip extension 10X each Sitting:  sit to stands 5X 2  sets no UE from standard chair  LAQ 10X each LE  Cervical ROM all directions 5X each  11/02/22 Ambulation with RW 88', 92' with rest between Standing:  marching alternating 8X  Heelraises 8X  Hip abduction 5X each  Hip extension 5X each Sitting:  sit to stands with each exercise 10X approx  LAQ 10X each LE  Alternating march 10X each  Cervical holding upright in seated 5X 5" holds  Cervical rotation 5X each Supine:  bridge 10X  SLR 10X each  Hip abduction with RTB 10X5"   10/31/22: Reviewed goals Educated importance of HEP complaince Pt able to recall, admits only doing LAQ currently not began STS yet  STS 6 times prior fatigue with HHA LAQ 4 times prior fatigue Seated scapular retraction 10x 5" Standing: March alternating 8x with HHA Heel raises 7x with UE support  Ambulate RW 66ft  Nustep UE/LE x 3 min prior fatigue   10/27/22 Eval and HEP     PATIENT EDUCATION:  Education details: Patient educated on exam findings, POC, scope of PT, HEP, Person educated: Patient Education method: Programmer, multimedia, Demonstration, and Handouts Education comprehension: verbalized understanding, returned demonstration, verbal cues required, and tactile cues required  HOME EXERCISE PROGRAM: Access Code: [email or web address] URL: [email or web address]   Date: 10/27/2022 - Sit to Stand  - 3 x daily - 7 x weekly - 2 sets - 10 reps - Seated Long Arc Quad  - 3 x daily - 7 x weekly - 2 sets - 10 reps - 5-10 second hold  10/31/22: -seated posture -scapular retraction  Date: 11/02/2022 - Seated March  - 3 x daily - 7 x weekly - 2 sets - 10 reps - Supine Bridge  - 3 x daily - 7 x weekly - 2 sets - 10 reps - Hooklying Clamshell with Resistance  - 3 x daily - 7 x weekly - 2 sets - 10 reps - 5  sec hold - Supine Active Straight Leg Raise  - 3 x daily - 7 x weekly - 2 sets - 10 reps  ASSESSMENT:  CLINICAL IMPRESSION: Interventions today were geared towards LE strengthening and  ambulation. Demonstrated moderate to severe levels of fatigue especially with the LE exercises so frequent and prolonged rest periods provided. Provided mild amount of cueing to ensure correct execution of activity with fair carry-over due to weakness. Due to extreme fatigue, patient requested to terminate the session early. Patient then reported of some lightheadedness. BP was taken twice in sitting, 103/77 (1st reading) and 86/52 (2nd reading) with SpO2 at 97%. Daughter reports that patient's BP tend to fluctuate. Due to a significant drop in BP, patient was laid supine with the legs elevated. Vital signs were taken in supine with BP = 124/80, SpO2 = 91% with reports of patient being cold. Patient was then covered with a sheet to keep the patient warm. Vital signs were taken again with BP = 143/81 and SpO2 = 96%. Patient then states that she felt better. Patient was then left with OT for continued monitoring. Due to unstable vital signs, patient may need to be rechecked by MD. However, patient will still need skilled PT is required to address the impairments and improve function.   Eval:  Patient a 57 y.o. y.o. female who was seen today for physical therapy evaluation and treatment for weakness due to hx of cervical myelopathy. Patient presents with pain limited deficits in bilateral LE strength, ROM, endurance, activity tolerance, gait, balance, posture,  and functional mobility with ADL. Patient is having to modify and restrict ADL as indicated by subjective information and objective measures which is affecting overall participation. Patient will benefit from skilled physical therapy in order to improve function and reduce impairment.  Returns to MD 8/23 in hopes to have brace removed.   OBJECTIVE IMPAIRMENTS: decreased activity tolerance, decreased balance, decreased endurance, decreased mobility, difficulty walking, decreased ROM, decreased strength, increased muscle spasms, impaired flexibility, improper  body mechanics, postural dysfunction, and pain.   ACTIVITY LIMITATIONS: carrying, lifting, bending, standing, squatting, stairs, transfers, locomotion level, and caring for others  PARTICIPATION LIMITATIONS: meal prep, cleaning, laundry, shopping, community activity, and yard work  PERSONAL FACTORS: Fitness, Time since onset of injury/illness/exacerbation, and 3+ comorbidities: hx cervical myelopathy from fall, cervical fusion C5-T1, COPD, DM, HTN, PVD  are also affecting patient's functional outcome.   REHAB POTENTIAL: Good  CLINICAL DECISION MAKING: Evolving/moderate complexity  EVALUATION COMPLEXITY: Moderate   GOALS: Goals reviewed with patient? Yes  SHORT TERM GOALS: Target date: 11/17/2022    Patient will be independent with HEP in order to improve functional outcomes. Baseline: Goal status: IN PROGRESS  2.  Patient will report at least 25% improvement in symptoms for improved quality of life. Baseline: Goal status: IN PROGRESS    LONG TERM GOALS: Target date: 12/08/2022    Timed up and go (TUG): 37.46 seconds with RW  Patient will report at least 75% improvement in symptoms for improved quality of life. Baseline:  Goal status: IN PROGRESS  2.  Patient will improve Timed up and go (TUG) time to less than 20 seconds in order to improved safe ambulation ability. Baseline: 37.46 seconds with RW Goal status: IN PROGRESS  3.  Patient will be able to navigate stairs with reciprocal pattern without compensation in order to demonstrate improved LE strength. Baseline: patient states impaired and requires assistance Goal status: IN PROGRESS  4.  Patient will be able to ambulate  at least 226 feet in in order to demonstrate improved tolerance to activity. Baseline: 75 feet with RW, had to stop at 1 minute due to fatigue Goal status: IN PROGRESS  5.  Patient will be able to complete 5x STS in under 20 seconds in order to reduce the risk of falls. Baseline: 36.13  seconds with UE support Goal status: IN PROGRESS  6.  Patient will demonstrate grade of 4+/5 MMT grade in all tested musculature as evidence of improved strength to assist with stair ambulation and gait.   Baseline: see above Goal status: IN PROGRESS   PLAN:  PT FREQUENCY: 2x/week  PT DURATION: 6 weeks  PLANNED INTERVENTIONS: Therapeutic exercises, Therapeutic activity, Neuromuscular re-education, Balance training, Gait training, Patient/Family education, Joint manipulation, Joint mobilization, Stair training, Orthotic/Fit training, DME instructions, Aquatic Therapy, Dry Needling, Electrical stimulation, Spinal manipulation, Spinal mobilization, Cryotherapy, Moist heat, Compression bandaging, scar mobilization, Splintting, Taping, Traction, Ultrasound, Ionotophoresis 4mg /ml Dexamethasone, and Manual therapy  PLAN FOR NEXT SESSION: Continue POC and may progress as tolerated with emphasis on increasing bilateral LE/functional strengthening,  activity tolerance, posture and cervical strength. Patient may need to be re-assessed by MD for unstable vital signs.   Tish Frederickson. Grant Swager, PT, DPT, OCS Board-Certified Clinical Specialist in Orthopedic PT PT Compact Privilege # (): [id number] T 11/18/2022, 9:22 AM

## 2022-11-18 NOTE — Therapy (Signed)
[facility] Outpatient Rehabilitation at [facility] [address] [place], [place], [address] Phone: [phone]   Fax:  [phone]  Patient Details  Name: Alexandria Webster MRN: [id number] Date of Birth: 1965-08-02 Referring Provider:  Assunta Found, MD  Encounter Date: 11/18/2022  Pt arrived for PT at 9am, started her visit and began feeling poorly. PT checked BP which fluctuated throughout the session, and pt needed to lay down due to feeling weak and dizzy. OT then checking on Pt as her BP was rising again to 143/81 and dropping to 97/52 with transfer to wheel chair, pt and family elected to cancel OT treatment due to continuing to feel poorly.    Anthone Prieur Rosemarie Beath, OT 11/18/2022, 10:18 AM  [facility] Outpatient Rehabilitation at [facility] [address] [place], [place], [address] Phone: [phone]   Fax:  [phone]

## 2022-11-21 ENCOUNTER — Encounter (HOSPITAL_COMMUNITY): Payer: Self-pay | Admitting: Ophthalmology

## 2022-11-21 ENCOUNTER — Encounter: Payer: 59 | Admitting: Physical Medicine and Rehabilitation

## 2022-11-21 ENCOUNTER — Other Ambulatory Visit: Payer: Self-pay

## 2022-11-21 NOTE — Progress Notes (Signed)
Anesthesia Chart Review: Alexandria Webster  Case: [id number] Date/Time: 11/22/22 1445   Procedure: REPAIR OF COMPLEX TRACTION RETINAL DETACHMENT (Right) - MAC WITH BLOCK   Anesthesia type: Monitor Anesthesia Care   Pre-op diagnosis: TRACTIONAL RETINAL DETACHMENT RIGHT EYE   [facility]: [facility] ROOM 08 / [facility]   Surgeons: Carmela Rima, MD       DISCUSSION: Patient is a 57 year old female scheduled for the above procedure.S/p repair of complex traction retinal detachment, pars plana vitrectomy 6/25/424. That procedure was done under MAC anesthesia.  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   Other history includes former smoker (but current smokeless tobacco use), post-operative N/V, HTN (but also on midodrine, DM2, PVD (PTA infrarenal aorta 02/27/02; AFBG 07/28/06; redo AFBG 09/27/18), COPD, asthma, hypothyroidism, GERD, gastroparesis, spinal surgery (Left L4-5 microdiscectomy 07/08/04; C5-7 laminectomy, C4-T1 posterolateral arthrodesis 07/19/22 for myelopathy), retinal detachment (s/p repair of complex traction retinal detachment, pars plana vitrectomy 09/06/22).    A1c 9.3% On 07/14/22. She is on Amaryl 2 mg daily and Trulicity 1.5 mg weekly. Nursing staff to clarify last Trulicity dose.  She normally takes on Saturdays.     Surgery is for retinal detachment. If she did not hold Trulicity for 7 days, then will need to be considered for GETA. Anesthesia team to evaluate on the day of surgery and definitive anesthesia plan to be determined at that time.     VS: LMP 01/21/2012  Wt Readings from Last 3 Encounters:  09/06/22 56 kg  08/15/22 55.3 kg  08/05/22 55.6 kg   BP Readings from Last 3 Encounters:  10/10/22 (!) 156/85  09/06/22 104/69  08/15/22 131/79   Pulse Readings from  Last 3 Encounters:  10/10/22 94  09/06/22 82  08/15/22 85     PROVIDERS: Assunta Found, MD is PCP  Gretta Began, MD is vascular surgeon Julio Sicks, MD is neurosurgeon   LABS: For labs on the day of surgery as indicates. Last results in [facility] include: Lab Results  Component Value Date   WBC 6.2 09/06/2022   HGB 12.5 09/06/2022   HCT 36.8 09/06/2022   PLT 178 09/06/2022   GLUCOSE 99 09/06/2022   ALT 8 07/25/2022   AST 11 (L) 07/25/2022   NA 137 09/06/2022   K 3.4 (L) 09/06/2022   CL 105 09/06/2022   CREATININE 0.75 09/06/2022   BUN 14 09/06/2022   CO2 21 (L) 09/06/2022   TSH 1.411 06/20/2022   INR 1.1 06/20/2022   HGBA1C 9.3 (H) 07/14/2022    IMAGES: 1V CXR 07/25/22: FINDINGS: No consolidation, pneumothorax or effusion. Slight linear opacity of the left lung base likely scar or atelectasis. Normal cardiopericardial silhouette. Calcified aorta. Degenerative changes of the spine. Osteopenia. Fixation hardware seen along the cervical spine at the edge of the imaging field. IMPRESSION: Minimal left basilar scar or atelectasis. No consolidation or effusion.   MRI Brain 06/20/22: IMPRESSION: 1. No acute intracranial abnormality. 2. Age-related cerebral atrophy with mild chronic small vessel ischemic disease, with a few small remote lacunar infarcts about the thalami and cerebellum.   MRI C-spine 06/20/22: IMPRESSION: 1. Multilevel cervical spondylosis with resultant diffuse spinal stenosis, severe in nature at C5-6 and C6-7. Patchy cord signal changes at these levels consistent with myelomalacia. Neurosurgical/spine surgery consultation recommended. 2. Question additional subtle cord signal abnormality at the level of  C3-4, also suspicious for myelomalacia. 3. Multifactorial degenerative changes with resultant multilevel foraminal narrowing as above. Notable findings include severe bilateral C4 foraminal stenosis, moderate left with severe right C5 foraminal narrowing,  severe bilateral C6 and C7 foraminal stenosis, with moderate left C8 foraminal narrowing.   CTA Neck 08/07/21: IMPRESSION: 1. Age-indeterminate right vertebral artery occlusion just distal to its origin and weakly reconstitutes at the C3 level. This could be due to atherosclerosis or dissection. 2. Left vertebral artery is patent with moderate to severe stenosis due to mass effect from adjacent osteophytes at C5-C6. 3. Bilateral carotid bifurcation atherosclerosis with approximately 50% left proximal ICA stenosis. 4. Approximately 50% stenosis of the right brachiocephalic origin. 5. 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 Recommend MRI of the cervical spine with contrast to further evaluate and to also exclude a mass. 6. Please see same day maxillofacial CT for evaluation of findings in the face.     EKG: EKG 07/14/22: Sinus rhythm Multiple ventricular premature complexes Low voltage, extremity and precordial leads since last tracing no significant change Confirmed by Rolan Bucco [phone]) on 07/15/2022 9:41:25 AM     CV: US Carotid 08/07/21: IMPRESSION: 1. 50-69% stenosis of the left internal carotid artery. 2. Less than 50% stenosis of the right internal carotid artery. 3. Interval development of retrograde flow in right vertebral artery. 4. Further evaluation with CT angiography of the neck should be considered tissue min the cause of new retrograde right vertebral artery flow.     Echo 08/06/21: IMPRESSIONS   1. Left ventricular ejection fraction, by estimation, is 65 to 70%. Left  ventricular ejection fraction by 2D MOD biplane is 68.5 %. The left  ventricle has normal  function. The left ventricle has no regional wall  motion abnormalities. Left ventricular  diastolic parameters are consistent with Grade I diastolic dysfunction  (impaired relaxation).   2. Right ventricular systolic function is normal. The right ventricular  size is normal. Tricuspid regurgitation signal is inadequate for assessing  PA pressure.   3. The mitral valve is grossly normal. No evidence of mitral valve  regurgitation.   4. The aortic valve is tricuspid. Aortic valve regurgitation is not  visualized.   5. The inferior vena cava is normal in size with greater than 50%  respiratory variability, suggesting right atrial pressure of 3 mmHg.  - Comparison(s): Changes from prior study are noted. 09/06/2018: LVEF 60-65%, grade 1 DD.    Past Medical History:  Diagnosis Date   Asthma    COPD (chronic obstructive pulmonary disease) (HCC)    DDD (degenerative disc disease), lumbar    Depression    Diabetes mellitus    Gastroparesis    GERD without esophagitis    High cholesterol    Hypertension    Hypothyroidism    PONV (postoperative nausea and vomiting)    PVD (peripheral vascular disease) (HCC)    Wears glasses     Past Surgical History:  Procedure Laterality Date   AORTA - BILATERAL FEMORAL ARTERY BYPASS GRAFT N/A 09/27/2018   Procedure: Redo Exposure  AORTOBIFEMORAL Bypass and bilateral femoral Artery ; Redo Aortobifemoral  BYPASS GRAFT.;  Surgeon: Cephus Shelling, MD;  Location: [facility] OR;  Service: Vascular;  Laterality: N/A;   BACK SURGERY  BIOPSY  10/14/2021   Procedure: BIOPSY;  Surgeon: Lanelle Bal, DO;  [facility];  Service: Endoscopy;;   COLONOSCOPY WITH PROPOFOL N/A 10/14/2021   Procedure: COLONOSCOPY WITH PROPOFOL;  Surgeon: Lanelle Bal, DO;  [facility];  Service: Endoscopy;  Laterality: N/A;  8:30am   ESOPHAGOGASTRODUODENOSCOPY (EGD) WITH PROPOFOL N/A 10/14/2021   Procedure: ESOPHAGOGASTRODUODENOSCOPY (EGD) WITH  PROPOFOL;  Surgeon: Lanelle Bal, DO;  [facility]: [facility];  Service: Endoscopy;  Laterality: N/A;   FEMORAL ARTERY STENT  right leg   INJECTION OF SILICONE OIL Left 09/06/2022   Procedure: INJECTION OF SILICONE OIL;  Surgeon: Carmela Rima, MD;  Location: [facility] OR;  Service: Ophthalmology;  Laterality: Left;   MEMBRANE PEEL Left 09/06/2022   Procedure: MEMBRANECTOMY;  Surgeon: Carmela Rima, MD;  Location: [facility] OR;  Service: Ophthalmology;  Laterality: Left;   PARS PLANA VITRECTOMY Left 09/06/2022   Procedure: PARS PLANA VITRECTOMY WITH 25 GAUGE;  Surgeon: Carmela Rima, MD;  Location: [facility] OR;  Service: Ophthalmology;  Laterality: Left;   PHOTOCOAGULATION WITH LASER Left 09/06/2022   Procedure: PHOTOCOAGULATION WITH LASER;  Surgeon: Carmela Rima, MD;  Location: [facility] OR;  Service: Ophthalmology;  Laterality: Left;   POSTERIOR CERVICAL FUSION/FORAMINOTOMY N/A 07/19/2022   Procedure: Posterior cervical fusion with lateral mass fixation - Cervical four-cervical five, Cervical five-Cervical six - Cervical six-Cervical seven - Cervical seven-Thoracic one with laminectomy;  Surgeon: Julio Sicks, MD;  Location: [facility] OR;  Service: Neurosurgery;  Laterality: N/A;   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   Procedure: REPAIR OF COMPLEX TRACTION RETINAL DETACHMENT;  Surgeon: Carmela Rima, MD;  Location: [facility] OR;  Service: Ophthalmology;  Laterality: Left;  MAC WITH BLOCK   TUBAL LIGATION      MEDICATIONS: No current facility-administered medications for this encounter.    acetaminophen (TYLENOL) 325 MG tablet   albuterol (PROVENTIL) 2 MG tablet   albuterol (VENTOLIN HFA) 108 (90 Base) MCG/ACT inhaler   aspirin 325 MG tablet   cetirizine (ZYRTEC) 10 MG tablet   cyclobenzaprine (FLEXERIL) 5 MG tablet   docusate sodium (COLACE) 100 MG capsule   Dulaglutide (TRULICITY) 1.5 MG/0.5ML SOPN   esomeprazole (NEXIUM) 40 MG capsule   glimepiride (AMARYL) 2 MG tablet    HYDROcodone-acetaminophen (NORCO/VICODIN) 5-325 MG tablet   levothyroxine (SYNTHROID) 50 MCG tablet   linaclotide (LINZESS) 145 MCG CAPS capsule   lovastatin (MEVACOR) 20 MG tablet   meclizine (ANTIVERT) 25 MG tablet   midodrine (PROAMATINE) 5 MG tablet   oxymetazoline (VICKS SINEX 12 HOUR DECONGEST) 0.05 % nasal spray   potassium chloride (KLOR-CON) 10 MEQ tablet   traZODone (DESYREL) 150 MG tablet    Shonna Chock, PA-C Surgical Short Stay/Anesthesiology [facility] Phone [phone] [facility] Phone [phone] 11/21/2022 4:47 PM

## 2022-11-21 NOTE — Anesthesia Preprocedure Evaluation (Signed)
Anesthesia Evaluation  Patient identified by MRN, date of birth, ID band Patient awake    Reviewed: Allergy & Precautions, NPO status , Patient's Chart, lab work & pertinent test results  History of Anesthesia Complications (+) PONV and history of anesthetic complications  Airway Mallampati: II  TM Distance: >3 FB Neck ROM: Full    Dental  (+) Poor Dentition, Missing, Loose, Chipped   Pulmonary asthma , COPD, former smoker    + decreased breath sounds      Cardiovascular hypertension, + Peripheral Vascular Disease   Rhythm:Regular Rate:Normal  Echo:  1. Left ventricular ejection fraction, by estimation, is 65 to 70%. Left  ventricular ejection fraction by 2D MOD biplane is 68.5 %. The left  ventricle has normal function. The left ventricle has no regional wall  motion abnormalities. Left ventricular  diastolic parameters are consistent with Grade I diastolic dysfunction  (impaired relaxation).   2. Right ventricular systolic function is normal. The right ventricular  size is normal. Tricuspid regurgitation signal is inadequate for assessing  PA pressure.   3. The mitral valve is grossly normal. No evidence of mitral valve  regurgitation.   4. The aortic valve is tricuspid. Aortic valve regurgitation is not  visualized.   5. The inferior vena cava is normal in size with greater than 50%  respiratory variability, suggesting right atrial pressure of 3 mmHg.     Neuro/Psych  PSYCHIATRIC DISORDERS  Depression       GI/Hepatic Neg liver ROS,GERD  ,,  Endo/Other  diabetes, Type 2, Oral Hypoglycemic AgentsHypothyroidism    Renal/GU Renal disease     Musculoskeletal  (+) Arthritis ,    Abdominal   Peds  Hematology negative hematology ROS (+)   Anesthesia Other Findings   Reproductive/Obstetrics                             Anesthesia Physical Anesthesia Plan  ASA: 3  Anesthesia Plan: MAC    Post-op Pain Management: Minimal or no pain anticipated   Induction: Intravenous  PONV Risk Score and Plan: 1 and Propofol infusion, Ondansetron and TIVA  Airway Management Planned: Natural Airway and Nasal Cannula  Additional Equipment: None  Intra-op Plan:   Post-operative Plan:   Informed Consent: I have reviewed the patients History and Physical, chart, labs and discussed the procedure including the risks, benefits and alternatives for the proposed anesthesia with the patient or authorized representative who has indicated his/her understanding and acceptance.       Plan Discussed with: CRNA  Anesthesia Plan Comments: (See PAT note written 11/21/2022 by Shonna Chock, PA-C.  )       Anesthesia Quick Evaluation

## 2022-11-21 NOTE — Progress Notes (Signed)
SDW CALL  Patient was given pre-op instructions over the phone. The opportunity was given for the patient to ask questions. No further questions asked. Patient verbalized understanding of instructions given.   PCP - Jeannetta Ellis Cardiologist - denies  PPM/ICD - denies Device Orders -  Rep Notified -   Chest x-ray - 07/25/22 EKG - 07/15/22 Stress Test -  ECHO - 08/06/21 Cardiac Cath - none  Sleep Study - denies CPAP -   Fasting Blood Sugar - 100 Checks Blood Sugar every other day  Blood Thinner Instructions:na Aspirin Instructions:hold DOS  ERAS Protcol - clear liquids until 1200 PRE-SURGERY Ensure or G2- no  COVID TEST- na   Anesthesia review: yes  Patient denies shortness of breath, fever, cough and chest pain over the phone call    Surgical Instructions    Your procedure is scheduled on September 10  Report to [facility] Main Entrance "A" at 1230 P.M., then check in with the Admitting office.  Call this number if you have problems the morning of surgery:  [phone]    Remember:  Do not eat after midnight the night before your surgery  You may drink clear liquids until 1200 the morning of your surgery.   Clear liquids allowed are: Water, Non-Citrus Juices (without pulp), Carbonated Beverages, Clear Tea, Black Coffee ONLY (NO MILK, CREAM OR POWDERED CREAMER of any kind), and Gatorade   Take these medicines the morning of surgery with A SIP OF WATER:     As of today, STOP taking any Aspirin (unless otherwise instructed by your surgeon) Aleve, Naproxen, Ibuprofen, Motrin, Advil, Goody's, BC's, all herbal medications, fish oil, and all vitamins.  WHAT DO I DO ABOUT MY DIABETES MEDICATION?  Do not take oral diabetes medicines (pills) the morning of surgery. Do not take glimepiride(Amaryl) the morning of surgery.  HOW TO MANAGE YOUR DIABETES BEFORE AND AFTER SURGERY  Check your blood sugar the morning of your surgery when you wake up and every 2 hours  until you get to the Short Stay unit.  If your blood sugar is less than 70 mg/dL, you will need to treat for low blood sugar: Do not take insulin. Treat a low blood sugar (less than 70 mg/dL) with  cup of clear juice (cranberry or apple), 4 glucose tablets, OR glucose gel. Recheck blood sugar in 15 minutes after treatment (to make sure it is greater than 70 mg/dL). If your blood sugar is not greater than 70 mg/dL on recheck, call [phone] for further instructions. Report your blood sugar to the short stay nurse when you get to Short Stay.  [facility] is not responsible for any belongings or valuables. .   Do NOT Smoke (Tobacco/Vaping)  24 hours prior to your procedure  If you use a CPAP at night, you may bring your mask for your overnight stay.   Contacts, glasses, hearing aids, dentures or partials may not be worn into surgery, please bring cases for these belongings   Patients discharged the day of surgery will not be allowed to drive home, and someone needs to stay with them for 24 hours.   SURGICAL WAITING ROOM VISITATION You may have 1 visitor in the pre-op area at a time determined by the pre-op nurse. (Visitor may not switch out) Patients having surgery or a procedure in a hospital may have two support people in the waiting room. Children under the age of 48 must have an adult with them who is not the patient. They may  stay in the waiting area during the procedure and may switch out with other visitors. If the patient needs to stay at the hospital during part of their recovery, the visitor guidelines for inpatient rooms apply.  Please refer to the [facility] website for the visitor guidelines for Inpatients (after your surgery is over and you are in a regular room).     Special instructions:    Oral Hygiene is also important to reduce your risk of infection.  Remember - BRUSH YOUR TEETH THE MORNING OF SURGERY WITH YOUR REGULAR TOOTHPASTE   Day of Surgery:  Take a  shower the day of or night before with antibacterial soap. Wear Clean/Comfortable clothing the morning of surgery Do not apply any deodorants/lotions.   Do not wear jewelry or makeup Do not wear lotions, powders, perfumes/colognes, or deodorant. Do not shave 48 hours prior to surgery.  Men may shave face and neck. Do not bring valuables to the hospital. Do not wear nail polish, gel polish, artificial nails, or any other type of covering on natural nails (fingers and toes) If you have artificial nails or gel coating that need to be removed by a nail salon, please have this removed prior to surgery. Artificial nails or gel coating may interfere with anesthesia's ability to adequately monitor your vital signs. Remember to brush your teeth WITH YOUR REGULAR TOOTHPASTE.

## 2022-11-22 ENCOUNTER — Encounter (HOSPITAL_COMMUNITY): Payer: 59

## 2022-11-22 ENCOUNTER — Encounter (HOSPITAL_COMMUNITY): Payer: 59 | Admitting: Occupational Therapy

## 2022-11-22 ENCOUNTER — Encounter (HOSPITAL_COMMUNITY)
Admission: RE | Disposition: A | Discharge status: Home / Self Care | Payer: Self-pay | Source: Home / Self Care | Attending: Ophthalmology

## 2022-11-22 ENCOUNTER — Ambulatory Visit (HOSPITAL_COMMUNITY): Payer: 59 | Admitting: Vascular Surgery

## 2022-11-22 ENCOUNTER — Ambulatory Visit (HOSPITAL_BASED_OUTPATIENT_CLINIC_OR_DEPARTMENT_OTHER): Payer: 59 | Admitting: Vascular Surgery

## 2022-11-22 ENCOUNTER — Ambulatory Visit (HOSPITAL_COMMUNITY)
Admission: RE | Admit: 2022-11-22 | Discharge: 2022-11-22 | Disposition: A | Discharge status: Home / Self Care | Payer: 59 | Attending: Ophthalmology | Admitting: Ophthalmology

## 2022-11-22 DIAGNOSIS — J4489 Other specified chronic obstructive pulmonary disease: Secondary | ICD-10-CM | POA: Diagnosis not present

## 2022-11-22 DIAGNOSIS — K3184 Gastroparesis: Secondary | ICD-10-CM | POA: Diagnosis not present

## 2022-11-22 DIAGNOSIS — I10 Essential (primary) hypertension: Secondary | ICD-10-CM | POA: Insufficient documentation

## 2022-11-22 DIAGNOSIS — Z87891 Personal history of nicotine dependence: Secondary | ICD-10-CM

## 2022-11-22 DIAGNOSIS — F17220 Nicotine dependence, chewing tobacco, uncomplicated: Secondary | ICD-10-CM | POA: Diagnosis not present

## 2022-11-22 DIAGNOSIS — E1143 Type 2 diabetes mellitus with diabetic autonomic (poly)neuropathy: Secondary | ICD-10-CM | POA: Diagnosis not present

## 2022-11-22 DIAGNOSIS — Z981 Arthrodesis status: Secondary | ICD-10-CM | POA: Diagnosis not present

## 2022-11-22 DIAGNOSIS — E113521 Type 2 diabetes mellitus with proliferative diabetic retinopathy with traction retinal detachment involving the macula, right eye: Secondary | ICD-10-CM | POA: Diagnosis not present

## 2022-11-22 DIAGNOSIS — Z7984 Long term (current) use of oral hypoglycemic drugs: Secondary | ICD-10-CM | POA: Insufficient documentation

## 2022-11-22 DIAGNOSIS — J449 Chronic obstructive pulmonary disease, unspecified: Secondary | ICD-10-CM | POA: Diagnosis not present

## 2022-11-22 DIAGNOSIS — E1151 Type 2 diabetes mellitus with diabetic peripheral angiopathy without gangrene: Secondary | ICD-10-CM | POA: Diagnosis not present

## 2022-11-22 DIAGNOSIS — E113522 Type 2 diabetes mellitus with proliferative diabetic retinopathy with traction retinal detachment involving the macula, left eye: Secondary | ICD-10-CM | POA: Diagnosis not present

## 2022-11-22 DIAGNOSIS — E113531 Type 2 diabetes mellitus with proliferative diabetic retinopathy with traction retinal detachment not involving the macula, right eye: Secondary | ICD-10-CM

## 2022-11-22 HISTORY — PX: INJECTION OF SILICONE OIL: SHX6422

## 2022-11-22 HISTORY — PX: PHOTOCOAGULATION WITH LASER: SHX6027

## 2022-11-22 HISTORY — PX: REPAIR OF COMPLEX TRACTION RETINAL DETACHMENT: SHX6217

## 2022-11-22 HISTORY — PX: AIR/FLUID EXCHANGE: SHX6494

## 2022-11-22 LAB — CBC
HCT: 39.4 % (ref 36.0–46.0)
Hemoglobin: 13.1 g/dL (ref 12.0–15.0)
MCH: 33.5 pg (ref 26.0–34.0)
MCHC: 33.2 g/dL (ref 30.0–36.0)
MCV: 100.8 fL — ABNORMAL HIGH (ref 80.0–100.0)
Platelets: 169 10*3/uL (ref 150–400)
RBC: 3.91 MIL/uL (ref 3.87–5.11)
RDW: 12.2 % (ref 11.5–15.5)
WBC: 5.8 10*3/uL (ref 4.0–10.5)
nRBC: 0 % (ref 0.0–0.2)

## 2022-11-22 LAB — BASIC METABOLIC PANEL
Anion gap: 11 (ref 5–15)
BUN: 20 mg/dL (ref 6–20)
CO2: 21 mmol/L — ABNORMAL LOW (ref 22–32)
Calcium: 9.4 mg/dL (ref 8.9–10.3)
Chloride: 104 mmol/L (ref 98–111)
Creatinine, Ser: 0.87 mg/dL (ref 0.44–1.00)
GFR, Estimated: >60 mL/min (ref >60)
Glucose, Bld: 98 mg/dL (ref 70–99)
Potassium: 4 mmol/L (ref 3.5–5.1)
Sodium: 136 mmol/L (ref 135–145)

## 2022-11-22 LAB — GLUCOSE, CAPILLARY
Glucose-Capillary: 99 mg/dL (ref 70–99)
Glucose-Capillary: 88 mg/dL (ref 70–99)
Glucose-Capillary: 86 mg/dL (ref 70–99)

## 2022-11-22 SURGERY — REPAIR, RETINAL DETACHMENT, COMPLEX
Anesthesia: Monitor Anesthesia Care | Site: Eye | Laterality: Right

## 2022-11-22 MED ORDER — DROPERIDOL 2.5 MG/ML IJ SOLN: 0.6250 mg | Freq: Once | INTRAMUSCULAR | Status: DC | PRN

## 2022-11-22 MED ORDER — CEFAZOLIN SUBCONJUNCTIVAL INJECTION 100 MG/0.5 ML
INJECTION | SUBCONJUNCTIVAL | Status: DC | PRN
  Administered 2022-11-22: 100 mg via SUBCONJUNCTIVAL

## 2022-11-22 MED ORDER — BRILLIANT BLUE G 0.025 % IO SOSY
0.5000 mL | PREFILLED_SYRINGE | INTRAOCULAR | Status: DC
  Filled 2022-11-22: qty 0.5

## 2022-11-22 MED ORDER — TOBRAMYCIN-DEXAMETHASONE 0.3-0.1 % OP OINT
TOPICAL_OINTMENT | OPHTHALMIC | Status: DC | PRN
  Administered 2022-11-22: 1 via OPHTHALMIC

## 2022-11-22 MED ORDER — FENTANYL CITRATE (PF) 250 MCG/5ML IJ SOLN
INTRAMUSCULAR | Status: DC | PRN
  Administered 2022-11-22 (×2): 25 ug via INTRAVENOUS
  Administered 2022-11-22: 50 ug via INTRAVENOUS

## 2022-11-22 MED ORDER — TOBRAMYCIN-DEXAMETHASONE 0.3-0.1 % OP OINT
TOPICAL_OINTMENT | OPHTHALMIC | Status: AC
  Filled 2022-11-22: qty 3.5

## 2022-11-22 MED ORDER — DEXAMETHASONE 0.1 % OP SUSP
OPHTHALMIC | Status: AC
  Filled 2022-11-22: qty 5

## 2022-11-22 MED ORDER — MIDAZOLAM HCL 2 MG/2ML IJ SOLN
INTRAMUSCULAR | Status: DC | PRN
  Administered 2022-11-22 (×2): 1 mg via INTRAVENOUS

## 2022-11-22 MED ORDER — CEFAZOLIN SUBCONJUNCTIVAL INJECTION 100 MG/0.5 ML
100.0000 mg | INJECTION | SUBCONJUNCTIVAL | Status: DC
  Filled 2022-11-22: qty 5

## 2022-11-22 MED ORDER — ONDANSETRON HCL 4 MG/2ML IJ SOLN
INTRAMUSCULAR | Status: DC | PRN
  Administered 2022-11-22: 4 mg via INTRAVENOUS

## 2022-11-22 MED ORDER — EPINEPHRINE PF 1 MG/ML IJ SOLN
INTRAMUSCULAR | Status: AC
  Filled 2022-11-22: qty 1

## 2022-11-22 MED ORDER — PHENYLEPHRINE 80 MCG/ML (10ML) SYRINGE FOR IV PUSH (FOR BLOOD PRESSURE SUPPORT)
PREFILLED_SYRINGE | INTRAVENOUS | Status: DC | PRN
  Administered 2022-11-22: 160 ug via INTRAVENOUS
  Administered 2022-11-22 (×2): 80 ug via INTRAVENOUS

## 2022-11-22 MED ORDER — PHENYLEPHRINE HCL 2.5 % OP SOLN
1.0000 [drp] | OPHTHALMIC | Status: AC | PRN
  Administered 2022-11-22 (×3): 1 [drp] via OPHTHALMIC
  Filled 2022-11-22: qty 2

## 2022-11-22 MED ORDER — PROMETHAZINE HCL 25 MG/ML IJ SOLN: 6.2500 mg | INTRAMUSCULAR | Status: DC | PRN

## 2022-11-22 MED ORDER — LIDOCAINE 2% (20 MG/ML) 5 ML SYRINGE
INTRAMUSCULAR | Status: DC | PRN
  Administered 2022-11-22: 40 mg via INTRAVENOUS

## 2022-11-22 MED ORDER — EPHEDRINE SULFATE-NACL 50-0.9 MG/10ML-% IV SOSY
PREFILLED_SYRINGE | INTRAVENOUS | Status: DC | PRN
  Administered 2022-11-22: 5 mg via INTRAVENOUS

## 2022-11-22 MED ORDER — FENTANYL CITRATE (PF) 100 MCG/2ML IJ SOLN: 25.0000 ug | INTRAMUSCULAR | Status: DC | PRN

## 2022-11-22 MED ORDER — PROPOFOL 500 MG/50ML IV EMUL
INTRAVENOUS | Status: DC | PRN
  Administered 2022-11-22: 15 ug/kg/min via INTRAVENOUS

## 2022-11-22 MED ORDER — SODIUM CHLORIDE 0.9 % IV SOLN: INTRAVENOUS | Status: DC

## 2022-11-22 MED ORDER — LACTATED RINGERS IV SOLN: INTRAVENOUS | Status: DC

## 2022-11-22 MED ORDER — BSS IO SOLN
INTRAOCULAR | Status: AC
  Filled 2022-11-22: qty 500

## 2022-11-22 MED ORDER — LIDOCAINE 2% (20 MG/ML) 5 ML SYRINGE
INTRAMUSCULAR | Status: AC
  Filled 2022-11-22: qty 5

## 2022-11-22 MED ORDER — BUPIVACAINE HCL (PF) 0.75 % IJ SOLN
INTRAMUSCULAR | Status: AC
  Filled 2022-11-22: qty 10

## 2022-11-22 MED ORDER — DEXAMETHASONE SODIUM PHOSPHATE 10 MG/ML IJ SOLN
INTRAMUSCULAR | Status: DC | PRN
  Administered 2022-11-22: 1 mL

## 2022-11-22 MED ORDER — PROPOFOL 10 MG/ML IV BOLUS
INTRAVENOUS | Status: AC
  Filled 2022-11-22: qty 20

## 2022-11-22 MED ORDER — FENTANYL CITRATE (PF) 250 MCG/5ML IJ SOLN
INTRAMUSCULAR | Status: AC
  Filled 2022-11-22: qty 5

## 2022-11-22 MED ORDER — OXYCODONE HCL 5 MG PO TABS: 5.0000 mg | ORAL_TABLET | Freq: Once | ORAL | Status: DC | PRN

## 2022-11-22 MED ORDER — DEXAMETHASONE SODIUM PHOSPHATE 10 MG/ML IJ SOLN
INTRAMUSCULAR | Status: AC
  Filled 2022-11-22: qty 1

## 2022-11-22 MED ORDER — ACETAMINOPHEN 160 MG/5ML PO SOLN: 325.0000 mg | ORAL | Status: DC | PRN

## 2022-11-22 MED ORDER — ONDANSETRON HCL 4 MG/2ML IJ SOLN
INTRAMUSCULAR | Status: AC
  Filled 2022-11-22: qty 2

## 2022-11-22 MED ORDER — SODIUM HYALURONATE 10 MG/ML IO SOLUTION
PREFILLED_SYRINGE | INTRAOCULAR | Status: AC
  Filled 2022-11-22: qty 0.85

## 2022-11-22 MED ORDER — ACETAMINOPHEN 325 MG PO TABS: 325.0000 mg | ORAL_TABLET | ORAL | Status: DC | PRN

## 2022-11-22 MED ORDER — ORAL CARE MOUTH RINSE: 15.0000 mL | Freq: Once | OROMUCOSAL | Status: AC

## 2022-11-22 MED ORDER — ATROPINE SULFATE 1 % OP SOLN
OPHTHALMIC | Status: AC
  Filled 2022-11-22: qty 5

## 2022-11-22 MED ORDER — CYCLOPENTOLATE HCL 1 % OP SOLN
1.0000 [drp] | OPHTHALMIC | Status: AC | PRN
  Administered 2022-11-22 (×3): 1 [drp] via OPHTHALMIC
  Filled 2022-11-22: qty 2

## 2022-11-22 MED ORDER — EPINEPHRINE PF 1 MG/ML IJ SOLN
INTRAOCULAR | Status: DC | PRN
  Administered 2022-11-22: 500.3 mL

## 2022-11-22 MED ORDER — CHLORHEXIDINE GLUCONATE 0.12 % MT SOLN
15.0000 mL | Freq: Once | OROMUCOSAL | Status: AC
  Administered 2022-11-22: 15 mL via OROMUCOSAL
  Filled 2022-11-22: qty 15

## 2022-11-22 MED ORDER — BSS PLUS IO SOLN
INTRAOCULAR | Status: AC
  Filled 2022-11-22: qty 500

## 2022-11-22 MED ORDER — TETRACAINE HCL 0.5 % OP SOLN
OPHTHALMIC | Status: AC
  Filled 2022-11-22: qty 4

## 2022-11-22 MED ORDER — LIDOCAINE HCL (PF) 2 % IJ SOLN
INTRAMUSCULAR | Status: DC | PRN
  Administered 2022-11-22: 6 mL via RETROBULBAR

## 2022-11-22 MED ORDER — PHENYLEPHRINE HCL-NACL 20-0.9 MG/250ML-% IV SOLN
INTRAVENOUS | Status: DC | PRN
  Administered 2022-11-22: 20 ug/min via INTRAVENOUS

## 2022-11-22 MED ORDER — OFLOXACIN 0.3 % OP SOLN
1.0000 [drp] | OPHTHALMIC | Status: AC | PRN
  Administered 2022-11-22: 1 [drp] via OPHTHALMIC
  Filled 2022-11-22: qty 5

## 2022-11-22 MED ORDER — LIDOCAINE HCL 2 % IJ SOLN
INTRAMUSCULAR | Status: AC
  Filled 2022-11-22: qty 20

## 2022-11-22 MED ORDER — PROPOFOL 10 MG/ML IV BOLUS
INTRAVENOUS | Status: DC | PRN
  Administered 2022-11-22: 20 mg via INTRAVENOUS

## 2022-11-22 MED ORDER — OXYCODONE HCL 5 MG/5ML PO SOLN: 5.0000 mg | Freq: Once | ORAL | Status: DC | PRN

## 2022-11-22 MED ORDER — HYALURONIDASE HUMAN 150 UNIT/ML IJ SOLN
INTRAMUSCULAR | Status: AC
  Filled 2022-11-22: qty 1

## 2022-11-22 MED ORDER — ACETAMINOPHEN 10 MG/ML IV SOLN: 1000.0000 mg | Freq: Once | INTRAVENOUS | Status: DC | PRN

## 2022-11-22 MED ORDER — PROPARACAINE HCL 0.5 % OP SOLN
1.0000 [drp] | OPHTHALMIC | Status: AC | PRN
  Administered 2022-11-22: 1 [drp] via OPHTHALMIC
  Filled 2022-11-22: qty 15

## 2022-11-22 MED ORDER — MIDAZOLAM HCL 2 MG/2ML IJ SOLN
INTRAMUSCULAR | Status: AC
  Filled 2022-11-22: qty 2

## 2022-11-22 MED ORDER — BSS IO SOLN
INTRAOCULAR | Status: AC
  Filled 2022-11-22: qty 15

## 2022-11-22 MED ORDER — PHENYLEPHRINE 80 MCG/ML (10ML) SYRINGE FOR IV PUSH (FOR BLOOD PRESSURE SUPPORT)
PREFILLED_SYRINGE | INTRAVENOUS | Status: AC
  Filled 2022-11-22: qty 10

## 2022-11-22 MED ORDER — SODIUM HYALURONATE 10 MG/ML IO SOLUTION
PREFILLED_SYRINGE | INTRAOCULAR | Status: DC | PRN
Start: 2022-11-22 — End: 2022-11-22
  Administered 2022-11-22: .55 mL via INTRAOCULAR

## 2022-11-22 SURGICAL SUPPLY — 51 items
APL SWBSTK 6 STRL LF DISP (MISCELLANEOUS) ×2
APPLICATOR COTTON TIP 6 STRL (MISCELLANEOUS) ×3 IMPLANT
APPLICATOR COTTON TIP 6IN STRL (MISCELLANEOUS) ×2
CABLE BIPOLOR RESECTION CORD (MISCELLANEOUS) IMPLANT
CANNULA ANT CHAM MAIN (OPHTHALMIC RELATED) IMPLANT
CANNULA DUALBORE 25G (CANNULA) IMPLANT
CANNULA VLV SOFT TIP 25G (OPHTHALMIC) ×3 IMPLANT
CANNULA VLV SOFT TIP 25GA (OPHTHALMIC) ×2 IMPLANT
CAUTERY EYE LOW TEMP 1300F FIN (OPHTHALMIC RELATED) IMPLANT
CLSR STERI-STRIP ANTIMIC 1/2X4 (GAUZE/BANDAGES/DRESSINGS) ×3 IMPLANT
DRAPE HALF SHEET 40X57 (DRAPES) ×3 IMPLANT
DRAPE INCISE 51X51 W/FILM STRL (DRAPES) ×3 IMPLANT
DRAPE RETRACTOR (MISCELLANEOUS) ×3 IMPLANT
ERASER HMR WETFIELD 23G BP (MISCELLANEOUS) IMPLANT
FORCEPS ECKARDT ILM 25G SERR (OPHTHALMIC RELATED) IMPLANT
FORCEPS GRIESHABER ILM 25G A (INSTRUMENTS) IMPLANT
GAS AUTO FILL CONSTEL (OPHTHALMIC)
GAS AUTO FILL CONSTELLATION (OPHTHALMIC) IMPLANT
GLOVE SURG SYN 7.5 E (GLOVE) ×2 IMPLANT
GLOVE SURG SYN 7.5 PF PI (GLOVE) ×3 IMPLANT
GOWN STRL REUS W/ TWL LRG LVL3 (GOWN DISPOSABLE) ×6 IMPLANT
GOWN STRL REUS W/TWL LRG LVL3 (GOWN DISPOSABLE) ×4
KIT BASIN OR (CUSTOM PROCEDURE TRAY) ×3 IMPLANT
KIT TURNOVER KIT B (KITS) ×3 IMPLANT
LENS BIOM SUPER VIEW SET DISP (MISCELLANEOUS) ×3 IMPLANT
NDL 18GX1X1/2 (RX/OR ONLY) (NEEDLE) ×3 IMPLANT
NDL FILTER BLUNT 18X1 1/2 (NEEDLE) ×3 IMPLANT
NDL HYPO 30X.5 LL (NEEDLE) ×6 IMPLANT
NDL RETROBULBAR 25GX1.5 (NEEDLE) ×3 IMPLANT
NEEDLE 18GX1X1/2 (RX/OR ONLY) (NEEDLE) ×2 IMPLANT
NEEDLE FILTER BLUNT 18X1 1/2 (NEEDLE) ×2 IMPLANT
NEEDLE HYPO 30X.5 LL (NEEDLE) ×4 IMPLANT
NEEDLE RETROBULBAR 25GX1.5 (NEEDLE) ×2 IMPLANT
NS IRRIG 1000ML POUR BTL (IV SOLUTION) ×3 IMPLANT
OIL SILICONE OPHTHALMIC 1000 (Ophthalmic Related) IMPLANT
PACK VITRECTOMY CUSTOM (CUSTOM PROCEDURE TRAY) ×3 IMPLANT
PAD ARMBOARD 7.5X6 YLW CONV (MISCELLANEOUS) ×6 IMPLANT
PAK PIK VITRECTOMY CVS 25GA (OPHTHALMIC) ×3 IMPLANT
PIC ILLUMINATED 25G (OPHTHALMIC) ×2
PIK ILLUMINATED 25G (OPHTHALMIC) IMPLANT
PROBE ENDO DIATHERMY 25G (MISCELLANEOUS) ×3 IMPLANT
PROBE LASER ILLUM FLEX CVD 25G (OPHTHALMIC) IMPLANT
SCRAPER DIAMOND 25GA (OPHTHALMIC RELATED) IMPLANT
SET INJECTOR OIL FLUID CONSTEL (OPHTHALMIC) IMPLANT
SOL ANTI FOG 6CC (MISCELLANEOUS) ×3 IMPLANT
STOPCOCK 4 WAY LG BORE MALE ST (IV SETS) IMPLANT
SUT VICRYL 7 0 TG140 8 (SUTURE) ×3 IMPLANT
SUT VICRYL 8 0 TG140 8 (SUTURE) IMPLANT
SYR 10ML LL (SYRINGE) IMPLANT
SYR TB 1ML LUER SLIP (SYRINGE) ×6 IMPLANT
WATER STERILE IRR 1000ML POUR (IV SOLUTION) ×3 IMPLANT

## 2022-11-22 NOTE — Discharge Instructions (Addendum)
DO NOT SLEEP ON BACK, THE EYE PRESSURE CAN GO UP AND CAUSE VISION LOSS   SLEEP ON SIDE WITH NOSE TO PILLOW  DURING DAY KEEP UPRIGHT 

## 2022-11-22 NOTE — H&P (Signed)
Date of examination:  11/22/2022  Indication for surgery: proliferative diabetic retinopathy with tractional retinal detachment right eye  Pertinent past medical history:  Past Medical History:  Diagnosis Date   Asthma    COPD (chronic obstructive pulmonary disease) (HCC)    DDD (degenerative disc disease), lumbar    Depression    Diabetes mellitus    Gastroparesis    GERD without esophagitis    High cholesterol    Hypertension    Hypothyroidism    PONV (postoperative nausea and vomiting)    PVD (peripheral vascular disease) (HCC)    Wears glasses     Pertinent ocular history:  proliferative diabetic retinopathy  Pertinent family history:  Family History  Problem Relation Age of Onset   Stroke Mother    Hypertension Mother    Heart failure Father    Asthma Father    Diabetes Father    Heart disease Father    Emphysema Paternal Grandfather    Colon cancer Neg Hx    Colon polyps Neg Hx     General:  Healthy appearing patient in no distress.    Eyes:    Acuity OD CF  External: Within normal limits      Anterior segment: Within normal limits           Fundus: tractional retinal detachment right eye with associated preretinal hemorrhage  B-scan - tractional retinal detachment right eye     Impression: Tractional retinal detachment right eye  Plan:  Tractional retinal detachment repair right eye  Carmela Rima, MD

## 2022-11-22 NOTE — Op Note (Signed)
YANCEY VEITCH 11/22/2022 Diagnosis: Proliferative diabetic retinopathy with tractional retinal detachment right eye  Procedure: Pars Plana Vitrectomy, Membrane Peeling, Endolaser, Silicone Oil, and membranectomy, air/fluid exchange Operative Eye:  right eye  Surgeon: Harrold Donath Estimated Blood Loss: minimal Specimens for Pathology:  None Complications: none    After informed consent was obtained, the patient was brought to the operating room and a time-out confirmed the correct operative eye as the left eye. Retrobulbar anesthesia was obtained in the left eye without complication  The  patient was prepped and draped in the usual fashion for ocular surgery on the  right eye .  A lid speculum was placed.  Infusion line and trocar was placed at the 4 o'clock position approximately 3.5 mm from the surgical limbus.   The infusion line was allowed to run and then clamped when placed at the cannula opening. The line was inserted and secured to the drape with an adhesive strip.   Active trocars/cannula were placed at the 10 and 2 o'clock positions approximately 3.5 mm from the surgical limbus. The cannula was visualized in the vitreous cavity.  The light pipe and vitreous cutter were inserted into the vitreous cavity and a core vitrectomy was performed.  Care taken to remove the vitreous up to the vitreous base for 360 degrees.   Attention was directed toward relieving the tractional detachment from the posterior pole in particular surrounding the macula as there was a large area of preretinal hemorrhage with overlying gliosis.  The gliotic membrane was dissected and elevated carefully towards the arcades and disc There was notable neovascular fronds with traction along the arcades.  Care was taken to elevate the membranes and remove them both with a vitrector and a light pipe with dissection. Hemostasis of the neovascular fronds was performed with endocautery and endolaser. Following  hemostasis, continued dissection of membranes and removal of membranes was performed including the temporal and inferior arcades.  After segmentation and delamination of neovascular gliotic membranes, attention was directed to endolaser portion of the procedure.  Endolaser was applied to the areas where the neovascular fronds were still present.  3 rows of endolaser were applied 360 degrees to the periphery.  A complete air-fluid exchange was then performed and additional endolaser was applied. 1000 centistoke silicone oil was injected into the eye. The trocars were sequentially removed and all were noted to be sealed. Subconjunctival injections of Ancef and Decadron were placed.   The speculum and drapes were removed and the eye was patched with Polymixin/Bacitracin ophthalmic ointment. An eye shield was placed and the patient was transferred alert and conversant with stable vital signs to the post operative recovery area.  The patient tolerated the procedure well and no complications were noted.    Harrold Donath MD

## 2022-11-22 NOTE — Transfer of Care (Signed)
Immediate Anesthesia Transfer of Care Note  Patient: Alexandria Webster  Procedure(s) Performed: REPAIR OF COMPLEX TRACTION RETINAL DETACHMENT (Right) INJECTION OF SILICONE OIL (Right: Eye) PHOTOCOAGULATION WITH LASER (Right: Eye) AIR/FLUID EXCHANGE (Right: Eye)  Patient Location: PACU  Anesthesia Type:MAC  Level of Consciousness: awake, alert , and oriented  Airway & Oxygen Therapy: Patient Spontanous Breathing  Post-op Assessment: Report given to RN and Post -op Vital signs reviewed and stable  Post vital signs: Reviewed and stable  Last Vitals:  Vitals Value Taken Time  BP 119/46 11/22/22 1710  Temp    Pulse 87 11/22/22 1712  Resp 15 11/22/22 1712  SpO2 98 % 11/22/22 1712  Vitals shown include unfiled device data.  Last Pain:  Vitals:   11/22/22 1207  TempSrc: Oral         Complications: No notable events documented.

## 2022-11-22 NOTE — Brief Op Note (Signed)
11/22/2022  5:05 PM  PATIENT:  Alexandria Webster  57 y.o. female  PRE-OPERATIVE DIAGNOSIS:  TRACTIONAL RETINAL DETACHMENT RIGHT EYE with PROLIFERATIVE DIABETIC RETINOPATHY  POST-OPERATIVE DIAGNOSIS:  TRACTIONAL RETINAL DETACHMENT RIGHT EYE with PROLIFERATIVE DIABETIC RETINOPATHY  PROCEDURE:  Procedure(s) with comments: REPAIR OF COMPLEX TRACTION RETINAL DETACHMENT (Right) - MAC WITH BLOCK INJECTION OF SILICONE OIL (Right) MEMBRANECTOMY (Right) PHOTOCOAGULATION WITH LASER (Right) AIR/FLUID EXCHANGE (Right)  SURGEON:  Surgeons and Role:    * Carmela Rima, MD - Primary  PHYSICIAN ASSISTANT:   ASSISTANTS: none   ANESTHESIA:   local and MAC  EBL:  MINIMAL   BLOOD ADMINISTERED:none  DRAINS: none   LOCAL MEDICATIONS USED:  MARCAINE    and LIDOCAINE   SPECIMEN:  No Specimen  DISPOSITION OF SPECIMEN:  N/A  COUNTS:  YES  TOURNIQUET:  * No tourniquets in log *  DICTATION: .Note written in EPIC  PLAN OF CARE: Discharge to home after PACU  PATIENT DISPOSITION:  PACU - hemodynamically stable.   Delay start of Pharmacological VTE agent (>24hrs) due to surgical blood loss or risk of bleeding: not applicable

## 2022-11-23 ENCOUNTER — Encounter (HOSPITAL_COMMUNITY): Payer: Self-pay | Admitting: Ophthalmology

## 2022-11-23 NOTE — Anesthesia Postprocedure Evaluation (Signed)
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) INJECTION OF SILICONE OIL (Right: Eye) PHOTOCOAGULATION WITH LASER (Right: Eye) AIR/FLUID EXCHANGE (Right: Eye)     Patient location during evaluation: PACU Anesthesia Type: MAC Level of consciousness: awake and alert Pain management: pain level controlled Vital Signs Assessment: post-procedure vital signs reviewed and stable Respiratory status: spontaneous breathing, nonlabored ventilation, respiratory function stable and patient connected to nasal cannula oxygen Cardiovascular status: stable and blood pressure returned to baseline Postop Assessment: no apparent nausea or vomiting Anesthetic complications: no  No notable events documented.  Last Vitals:  Vitals:   11/22/22 1715 11/22/22 1725  BP: 102/63 (!) 119/59  Pulse: 87 88  Resp: 12 18  Temp:  36.5 C  SpO2: 97% 98%    Last Pain:  Vitals:   11/22/22 1725  TempSrc:   PainSc: 0-No pain                 Orlandria Kissner L Finneas Mathe

## 2022-11-28 ENCOUNTER — Encounter (HOSPITAL_COMMUNITY): Payer: 59 | Admitting: Occupational Therapy

## 2022-11-28 ENCOUNTER — Encounter (HOSPITAL_COMMUNITY): Payer: 59

## 2022-12-01 ENCOUNTER — Encounter (HOSPITAL_COMMUNITY): Payer: 59

## 2022-12-01 ENCOUNTER — Encounter (HOSPITAL_COMMUNITY): Payer: 59 | Admitting: Occupational Therapy

## 2022-12-05 ENCOUNTER — Encounter (HOSPITAL_COMMUNITY): Payer: 59

## 2022-12-05 ENCOUNTER — Encounter (HOSPITAL_COMMUNITY): Payer: 59 | Admitting: Occupational Therapy

## 2022-12-05 DIAGNOSIS — H35373 Puckering of macula, bilateral: Secondary | ICD-10-CM | POA: Diagnosis not present

## 2022-12-05 DIAGNOSIS — H25813 Combined forms of age-related cataract, bilateral: Secondary | ICD-10-CM | POA: Diagnosis not present

## 2022-12-05 DIAGNOSIS — E113513 Type 2 diabetes mellitus with proliferative diabetic retinopathy with macular edema, bilateral: Secondary | ICD-10-CM | POA: Diagnosis not present

## 2022-12-05 DIAGNOSIS — H3582 Retinal ischemia: Secondary | ICD-10-CM | POA: Diagnosis not present

## 2022-12-07 ENCOUNTER — Encounter (HOSPITAL_COMMUNITY): Payer: 59

## 2022-12-07 ENCOUNTER — Encounter (HOSPITAL_COMMUNITY): Payer: 59 | Admitting: Occupational Therapy

## 2022-12-08 ENCOUNTER — Telehealth: Payer: Self-pay | Admitting: *Deleted

## 2022-12-08 NOTE — Telephone Encounter (Signed)
Pt called and states Linzess is not working for her. She has not had a bowel movement in 2 weeks. She states she needs something else to help her. Please advise.

## 2022-12-08 NOTE — Telephone Encounter (Signed)
Spoke to pt, informed her of recommendations. Pt voiced understanding. Reminded pt of OV on Monday Sept 30

## 2022-12-11 NOTE — Progress Notes (Unsigned)
GI Office Note    Referring Provider: Assunta Found, MD Primary Care Physician:  Assunta Found, MD Primary Gastroenterologist: Hennie Duos. Marletta Lor, DO  Date:  12/12/2022  ID:  Maricela Curet, DOB 1965-06-26, MRN [id number]   Chief Complaint   Chief Complaint  Patient presents with   Follow-up    Follow up and cinstipation   History of Present Illness  Alexandria Webster is a 57 y.o. female with a history of asthma, constipation, COPD, diabetes, gastroparesis, GERD, HTN, hypothyroidism, PVD presenting today for follow-up.   EGD 10/14/21: - small hiatal hernia - normal esophagus - gastritis s/p biopsy (normal mucosa) - normal duodenum - Advised PPI daily.    Colonoscopy 10/14/21: - Normal - Repeat in 10 years   OV 01/20/2022.  Improved GERD with Nexium twice daily.  Nausea vomiting significantly improved since starting Nexium twice daily.  Did report dairy does bother her.  Still having appetite and weight loss but this is likely secondary to Trulicity.  Constipation fairly well-controlled with Colace daily and MiraLAX as needed.  Denied any straining.  Bowel movement at least every day if not twice a day.  Nexium prescription updated, encouraged high-protein diet and protein supplementation and to monitor weight.  Follow-up in 6 months.  OV 02/09/22.  2 episodes of vomiting occurring in the evening.  Happens randomly without nausea.  Always occurring after she eats.  It admits to eating fried/greasy foods rarely.  Noted to be on Trulicity.  Have reported 9-10 pound weight loss since prior visit.  Taking Colace daily and if she takes MiraLAX she does all bit more and was going on Rehman for the most part.  Denies any abdominal pain, melena, or BRBPR.  Advise continue Nexium twice daily.  Continue MiraLAX and Colace.  Advised protein supplements and avoid lactose.  Last office visit 10/10/22. Taking MiraLAX and Colace once daily.  Occasionally having take mineral oil to have a bowel  movement.  She thinks MiraLAX complex her stools does not feel he is working well.  States sometimes she goes 2-3 weeks without a bowel movement, with mineral oil goes 2-3 days/week.  Still straining at times.  Mild toilet tissue hematochezia.  Mild nausea/vomiting with constipation, no overt abdominal pain.  GERD well-controlled if she takes Nexium twice daily, recently having issues with medication getting refilled.  Mild pill dysphagia but not frequently.  Decreased appetite likely secondary to Trulicity. Nexium BID, stool softener 1-2 times daily. Increase water intake. Trial Linzess 145 mcg daily. High protein diet.  Advised to trial Linzess 145 mcg daily, continue Nexium twice daily, continue stool softener daily and possibly consider increasing to twice daily.  On 9/26 patient reported 145 was not working for her as she had not had a bowel something additional.  Recommended for her to increase her Linzess to 290 mcg daily and start MiraLAX daily.  Today: Constipation - initially had one good BM with Linzess 145 mcg daily. Started 2 per day this past Friday. Took one dose of miralax on that Friday. Had a BM this morning - regular BM. Friday had one BM but states she took some mineral oil. Get bloated and crampy. No melena or brbpr.   GERD - well controlled with nexium   N/V, weight loss - N/V under control. Still taking trulicity. Eats small portions, does sometimes have to force herself.  Last night had chicken and string beans and pintos and a little rice and cheese. Last night ate more than her  usual.  Eats chicken and steak. Veggies (cauliflower, broccolli, corn).   Recent eye surgery on right eye. Has to have left eye redone in a couple weeks.   Neck surgery May 2 - weighed 122 lbs.   Wt Readings from Last 3 Encounters:  12/12/22 104 lb (47.2 kg)  11/22/22 122 lb (55.3 kg)  09/06/22 123 lb 7.3 oz (56 kg)    Current Outpatient Medications  Medication Sig Dispense Refill    acetaminophen (TYLENOL) 325 MG tablet Take 2 tablets (650 mg total) by mouth every 4 (four) hours as needed for mild pain ((score 1 to 3) or temp > 100.5). (Patient taking differently: Take 1,000 mg by mouth every morning.)     albuterol (PROVENTIL) 2 MG tablet Take 1 tablet (2 mg total) by mouth daily. 30 tablet 0   albuterol (VENTOLIN HFA) 108 (90 Base) MCG/ACT inhaler Inhale 1-2 puffs into the lungs every 4 (four) hours as needed for wheezing or shortness of breath. 8 g 0   aspirin 325 MG tablet Take 325 mg by mouth in the morning.     cetirizine (ZYRTEC) 10 MG tablet Take 10 mg by mouth at bedtime.     cyclobenzaprine (FLEXERIL) 5 MG tablet Take 5 mg by mouth at bedtime.     docusate sodium (COLACE) 100 MG capsule Take 200 mg by mouth every morning.     Dulaglutide (TRULICITY) 1.5 MG/0.5ML SOPN Inject 1.5 mg into the skin once a week. (Patient taking differently: Inject 1.5 mg into the skin every Saturday.) 0.5 mL 0   esomeprazole (NEXIUM) 40 MG capsule Take 1 capsule (40 mg total) by mouth 2 (two) times daily before a meal. 60 capsule 5   glimepiride (AMARYL) 2 MG tablet Take 1 tablet (2 mg total) by mouth daily with breakfast. 30 tablet 0   HYDROcodone-acetaminophen (NORCO/VICODIN) 5-325 MG tablet Take 1 tablet by mouth every 6 (six) hours as needed for moderate pain. Trying to wean dose- max 4x/day (Patient taking differently: Take 1 tablet by mouth at bedtime. Trying to wean dose- max 4x/day) 120 tablet 0   levothyroxine (SYNTHROID) 50 MCG tablet Take 1 tablet (50 mcg total) by mouth daily before breakfast. 30 tablet 0   linaclotide (LINZESS) 145 MCG CAPS capsule Take 1 capsule (145 mcg total) by mouth daily before breakfast. 30 capsule 5   lovastatin (MEVACOR) 20 MG tablet Take 1 tablet (20 mg total) by mouth at bedtime. (Patient taking differently: Take 20 mg by mouth in the morning.) 30 tablet 0   meclizine (ANTIVERT) 25 MG tablet Take 1 tablet (25 mg total) by mouth 3 (three) times daily as  needed for dizziness. (Patient taking differently: Take 25 mg by mouth See admin instructions. Take 1 tablet (25 mg) by mouth scheduled every morning, and may repeat dose (25 mg) by mouth up to twice daily if needed for dizziness/vertigo.) 30 tablet 0   midodrine (PROAMATINE) 5 MG tablet Take 1 tablet (5 mg total) by mouth 2 (two) times daily with breakfast and lunch. 60 tablet 5   oxymetazoline (VICKS SINEX 12 HOUR DECONGEST) 0.05 % nasal spray Place 1 spray into both nostrils at bedtime.     potassium chloride (KLOR-CON) 10 MEQ tablet Take 10 mEq by mouth every morning.     traZODone (DESYREL) 150 MG tablet Take 1 tablet (150 mg total) by mouth at bedtime as needed for sleep. (Patient taking differently: Take 150 mg by mouth at bedtime.) 30 tablet 5   No current  facility-administered medications for this visit.    Past Medical History:  Diagnosis Date   Asthma    COPD (chronic obstructive pulmonary disease) (HCC)    DDD (degenerative disc disease), lumbar    Depression    Diabetes mellitus    Gastroparesis    GERD without esophagitis    High cholesterol    Hypertension    Hypothyroidism    PONV (postoperative nausea and vomiting)    PVD (peripheral vascular disease) (HCC)    Wears glasses     Past Surgical History:  Procedure Laterality Date   AIR/FLUID EXCHANGE Right 11/22/2022   Procedure: AIR/FLUID EXCHANGE;  Surgeon: Carmela Rima, MD;  Location: [facility] OR;  Service: Ophthalmology;  Laterality: Right;   AORTA - BILATERAL FEMORAL ARTERY BYPASS GRAFT N/A 09/27/2018   Procedure: Redo Exposure  AORTOBIFEMORAL Bypass and bilateral femoral Artery ; Redo Aortobifemoral  BYPASS GRAFT.;  Surgeon: Cephus Shelling, MD;  Location: [facility] OR;  Service: Vascular;  Laterality: N/A;   BACK SURGERY     BIOPSY  10/14/2021   Procedure: BIOPSY;  Surgeon: Lanelle Bal, DO;  Location: [facility];  Service: Endoscopy;;   COLONOSCOPY WITH PROPOFOL N/A 10/14/2021   Procedure: COLONOSCOPY WITH  PROPOFOL;  Surgeon: Lanelle Bal, DO;  Location: [facility];  Service: Endoscopy;  Laterality: N/A;  8:30am   ESOPHAGOGASTRODUODENOSCOPY (EGD) WITH PROPOFOL N/A 10/14/2021   Procedure: ESOPHAGOGASTRODUODENOSCOPY (EGD) WITH PROPOFOL;  Surgeon: Lanelle Bal, DO;  Location: [facility];  Service: Endoscopy;  Laterality: N/A;   FEMORAL ARTERY STENT  right leg   INJECTION OF SILICONE OIL Left 09/06/2022   Procedure: INJECTION OF SILICONE OIL;  Surgeon: Carmela Rima, MD;  Location: [facility] OR;  Service: Ophthalmology;  Laterality: Left;   INJECTION OF SILICONE OIL Right 11/22/2022   Procedure: INJECTION OF SILICONE OIL;  Surgeon: Carmela Rima, MD;  Location: [facility] OR;  Service: Ophthalmology;  Laterality: Right;   MEMBRANE PEEL Left 09/06/2022   Procedure: MEMBRANECTOMY;  Surgeon: Carmela Rima, MD;  Location: [facility] OR;  Service: Ophthalmology;  Laterality: Left;   PARS PLANA VITRECTOMY Left 09/06/2022   Procedure: PARS PLANA VITRECTOMY WITH 25 GAUGE;  Surgeon: Carmela Rima, MD;  Location: [facility] OR;  Service: Ophthalmology;  Laterality: Left;   PHOTOCOAGULATION WITH LASER Left 09/06/2022   Procedure: PHOTOCOAGULATION WITH LASER;  Surgeon: Carmela Rima, MD;  Location: [facility] OR;  Service: Ophthalmology;  Laterality: Left;   PHOTOCOAGULATION WITH LASER Right 11/22/2022   Procedure: PHOTOCOAGULATION WITH LASER;  Surgeon: Carmela Rima, MD;  Location: [facility] OR;  Service: Ophthalmology;  Laterality: Right;   POSTERIOR CERVICAL FUSION/FORAMINOTOMY N/A 07/19/2022   Procedure: Posterior cervical fusion with lateral mass fixation - Cervical four-cervical five, Cervical five-Cervical six - Cervical six-Cervical seven - Cervical seven-Thoracic one with laminectomy;  Surgeon: Julio Sicks, MD;  Location: [facility] OR;  Service: Neurosurgery;  Laterality: N/A;   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   Procedure: REPAIR OF COMPLEX TRACTION RETINAL DETACHMENT;  Surgeon: Carmela Rima, MD;  Location:  [facility] OR;  Service: Ophthalmology;  Laterality: Left;  MAC WITH BLOCK   REPAIR OF COMPLEX TRACTION RETINAL DETACHMENT Right 11/22/2022   Procedure: REPAIR OF COMPLEX TRACTION RETINAL DETACHMENT;  Surgeon: Carmela Rima, MD;  Location: [facility] OR;  Service: Ophthalmology;  Laterality: Right;  MAC WITH BLOCK   TUBAL LIGATION      Family History  Problem Relation Age of Onset   Stroke Mother    Hypertension Mother    Heart failure  Father    Asthma Father    Diabetes Father    Heart disease Father    Emphysema Paternal Grandfather    Colon cancer Neg Hx    Colon polyps Neg Hx     Allergies as of 12/12/2022 - Review Complete 12/12/2022  Allergen Reaction Noted   Metformin and related Nausea And Vomiting 07/14/2022    Social History   Socioeconomic History   Marital status: Single    Spouse name: Not on file   Number of children: 2   Years of education: Not on file   Highest education level: Not on file  Occupational History   Not on file  Tobacco Use   Smoking status: Former    Current packs/day: 0.00    Types: Cigarettes    Quit date: 03/14/1982    Years since quitting: 40.7    Passive exposure: Past   Smokeless tobacco: Current    Types: Snuff  Vaping Use   Vaping status: Never Used  Substance and Sexual Activity   Alcohol use: No   Drug use: No   Sexual activity: Yes    Birth control/protection: Surgical    Comment: tubal  Other Topics Concern   Not on file  Social History Narrative   Not on file   Social Determinants of Health   Financial Resource Strain: Not on file  Food Insecurity: Not on file  Transportation Needs: Not on file  Physical Activity: Not on file  Stress: Not on file  Social Connections: Not on file     Review of Systems   Gen: Denies fever, chills, anorexia. Denies fatigue, weakness, weight loss.  CV: Denies chest pain, palpitations, syncope, peripheral edema, and claudication. Resp: Denies dyspnea at rest, cough, wheezing, coughing up  blood, and pleurisy. GI: See HPI Derm: Denies rash, itching, dry skin Psych: Denies depression, anxiety, memory loss, confusion. No homicidal or suicidal ideation.  Heme: Denies bruising, bleeding, and enlarged lymph nodes.   Physical Exam   BP 119/72   Pulse 94   Temp 97.6 F (36.4 C)   Ht 5\' 4"  (1.626 m)   Wt 104 lb (47.2 kg) Comment: pt's correct weight, she was weighed  LMP 01/21/2012   BMI 17.85 kg/m   General:   Alert and oriented. No distress noted. Pleasant and cooperative.  Frail appearing. Head:  Normocephalic and atraumatic. Eyes:  Conjuctiva clear without scleral icterus. Mouth:  Oral mucosa pink and moist. Good dentition. No lesions. Lungs:  Clear to auscultation bilaterally. No wheezes, rales, or rhonchi. No distress.  Heart:  S1, S2 present without murmurs appreciated.  Abdomen:  +BS, soft, non-tender and non-distended, flat. No rebound or guarding. No HSM or masses noted. Rectal: deferred Msk: Normal posture.  In soft neck brace today. Extremities:  Without edema. Neurologic:  Alert and  oriented x4 Psych:  Alert and cooperative. Normal mood and affect.   Assessment  Alexandria Webster is a 57 y.o. female with a history of asthma, constipation, COPD, diabetes, gastroparesis, GERD, HTN, hypothyroidism, PVD presenting today for follow-up.  Constipation, IBS, OIC: History of chronic constipation.  Known redundant colon.  Constipation not well-controlled despite Linzess 145 mcg daily.  Continues to have some intermittent abdominal cramping.  Does take nightly hydrocodone for neck pain.  For now we will increase Linzess to 290 mcg daily and advised MiraLAX 17 g once daily as well.  To help increase bowel movements will advise Dulcolax suppository for 3 days.  If no improvement within a  couple weeks she is advised to let me know and we can consider switching to [facility] given potential component of OIC.  GERD: Fairly well-controlled with Nexium 40 mg twice daily.   Refilled today.  Weight loss, N/V: Having rare nausea and vomiting, likely secondary to worsening bouts of constipation.  GERD fairly well-controlled.  Weight 104 pounds today with BMI 17.8.  Advised that her ideal body weight should be around 110 pounds - 120 pounds.  Suspect some of this is likely due to decreased appetite in the setting of Trulicity as well as her other multiple comorbidities.  Generally eats at least a protein and vegetables with each meal.  Advise increase in protein and calories, with goal to increase her calories by 500 a day to help gain weight.  Will reassess in 2 months.  PLAN   Increase calories by 500/day to help gain some weight. (Minimum 1750 cal/day) Linzess 290 mcg daily. Miralax 17g once daily User dulcolax suppository  Continue nexium 40 mg BID, refilled today. Increase water and fiber intake. Potentially will try movantik in future if Linzess 290 with MiraLAX is not helpful. Follow up in 2 months.     Brooke Bonito, MSN, FNP-BC, AGACNP-BC [facility]

## 2022-12-12 ENCOUNTER — Encounter (HOSPITAL_COMMUNITY): Payer: 59

## 2022-12-12 ENCOUNTER — Encounter: Payer: Self-pay | Admitting: Gastroenterology

## 2022-12-12 ENCOUNTER — Ambulatory Visit (INDEPENDENT_AMBULATORY_CARE_PROVIDER_SITE_OTHER): Payer: 59 | Admitting: Gastroenterology

## 2022-12-12 VITALS — BP 119/72 | HR 94 | Temp 97.6°F | Ht 64.0 in | Wt 104.0 lb

## 2022-12-12 DIAGNOSIS — R112 Nausea with vomiting, unspecified: Secondary | ICD-10-CM

## 2022-12-12 DIAGNOSIS — K581 Irritable bowel syndrome with constipation: Secondary | ICD-10-CM | POA: Diagnosis not present

## 2022-12-12 DIAGNOSIS — K219 Gastro-esophageal reflux disease without esophagitis: Secondary | ICD-10-CM | POA: Diagnosis not present

## 2022-12-12 DIAGNOSIS — K5903 Drug induced constipation: Secondary | ICD-10-CM | POA: Diagnosis not present

## 2022-12-12 DIAGNOSIS — R634 Abnormal weight loss: Secondary | ICD-10-CM | POA: Diagnosis not present

## 2022-12-12 MED ORDER — ESOMEPRAZOLE MAGNESIUM 40 MG PO CPDR: 40.0000 mg | DELAYED_RELEASE_CAPSULE | Freq: Two times a day (BID) | ORAL | 5 refills | Status: DC

## 2022-12-12 MED ORDER — LINACLOTIDE 290 MCG PO CAPS: 290.0000 ug | ORAL_CAPSULE | Freq: Every day | ORAL | 2 refills | Status: DC

## 2022-12-12 NOTE — Patient Instructions (Addendum)
Please follow the attached high-protein/high-calorie diet.  This will help you with some good choices to help increase your caloric intake to gain more weight.  Ideally I would like for you to be somewhere between 110 and 120 pounds.   In order to help increase your weight I would recommend at least 1750 cal/day or more.  For your constipation: I have increased your Linzess prescription for you, you can take 290 mcg daily. Start MiraLAX 17 g once daily in 8 ounces of water. For the next 3 days and when to use the Dulcolax suppository daily. Try increasing your water intake as well as your fiber intake.  I have also refilled your Nexium for you today.  I will see you in 2 months, sooner if needed.  It was a pleasure to see you today. I want to create trusting relationships with patients. If you receive a survey regarding your visit,  I greatly appreciate you taking time to fill this out on paper or through your MyChart. I value your feedback.  Brooke Bonito, MSN, FNP-BC, AGACNP-BC [facility]

## 2022-12-14 ENCOUNTER — Encounter (HOSPITAL_COMMUNITY): Payer: 59

## 2022-12-14 ENCOUNTER — Telehealth: Payer: Self-pay | Admitting: *Deleted

## 2022-12-14 NOTE — Telephone Encounter (Signed)
Call for a refill on Alexandria Webster's hydrocodone. Per PMP her last refill was 09/13/22 #120  and she has an upcoming appt with Alexandria Webster on 12/26/22.

## 2022-12-16 MED ORDER — HYDROCODONE-ACETAMINOPHEN 5-325 MG PO TABS: 1.0000 | ORAL_TABLET | Freq: Four times a day (QID) | ORAL | 0 refills | Status: DC | PRN

## 2022-12-19 DIAGNOSIS — I10 Essential (primary) hypertension: Secondary | ICD-10-CM | POA: Diagnosis not present

## 2022-12-19 DIAGNOSIS — M5030 Other cervical disc degeneration, unspecified cervical region: Secondary | ICD-10-CM | POA: Diagnosis not present

## 2022-12-19 DIAGNOSIS — F5101 Primary insomnia: Secondary | ICD-10-CM | POA: Diagnosis not present

## 2022-12-19 DIAGNOSIS — Z23 Encounter for immunization: Secondary | ICD-10-CM | POA: Diagnosis not present

## 2022-12-19 DIAGNOSIS — Z6821 Body mass index (BMI) 21.0-21.9, adult: Secondary | ICD-10-CM | POA: Diagnosis not present

## 2022-12-19 DIAGNOSIS — E782 Mixed hyperlipidemia: Secondary | ICD-10-CM | POA: Diagnosis not present

## 2022-12-19 DIAGNOSIS — R200 Anesthesia of skin: Secondary | ICD-10-CM | POA: Diagnosis not present

## 2022-12-19 DIAGNOSIS — R519 Headache, unspecified: Secondary | ICD-10-CM | POA: Diagnosis not present

## 2022-12-19 DIAGNOSIS — E1100 Type 2 diabetes mellitus with hyperosmolarity without nonketotic hyperglycemic-hyperosmolar coma (NKHHC): Secondary | ICD-10-CM | POA: Diagnosis not present

## 2022-12-19 DIAGNOSIS — T50905A Adverse effect of unspecified drugs, medicaments and biological substances, initial encounter: Secondary | ICD-10-CM | POA: Diagnosis not present

## 2022-12-20 DIAGNOSIS — E113512 Type 2 diabetes mellitus with proliferative diabetic retinopathy with macular edema, left eye: Secondary | ICD-10-CM | POA: Diagnosis not present

## 2022-12-21 DIAGNOSIS — I10 Essential (primary) hypertension: Secondary | ICD-10-CM | POA: Diagnosis not present

## 2022-12-21 DIAGNOSIS — E1159 Type 2 diabetes mellitus with other circulatory complications: Secondary | ICD-10-CM | POA: Diagnosis not present

## 2022-12-26 ENCOUNTER — Encounter: Payer: 59 | Attending: Physical Medicine and Rehabilitation | Admitting: Physical Medicine and Rehabilitation

## 2022-12-26 ENCOUNTER — Encounter: Payer: Self-pay | Admitting: Physical Medicine and Rehabilitation

## 2022-12-26 VITALS — BP 139/81 | Ht 64.0 in

## 2022-12-26 DIAGNOSIS — I951 Orthostatic hypotension: Secondary | ICD-10-CM | POA: Insufficient documentation

## 2022-12-26 DIAGNOSIS — R2689 Other abnormalities of gait and mobility: Secondary | ICD-10-CM | POA: Insufficient documentation

## 2022-12-26 DIAGNOSIS — G959 Disease of spinal cord, unspecified: Secondary | ICD-10-CM | POA: Insufficient documentation

## 2022-12-26 MED ORDER — MIDODRINE HCL 10 MG PO TABS: 10.0000 mg | ORAL_TABLET | Freq: Three times a day (TID) | ORAL | 5 refills | Status: DC

## 2022-12-26 MED ORDER — TRAZODONE HCL 150 MG PO TABS: 150.0000 mg | ORAL_TABLET | Freq: Every day | ORAL | 5 refills | Status: DC

## 2022-12-26 NOTE — Patient Instructions (Signed)
Pt is a 57 yr old female with hx of cervical myelopathy s/p C5-T1 decompression lamintomy and PCDF 07/19/22 by Dr Jordan Likes.  She also has hx of post op pain; COPD, hx of HTN with orthostatic hypotension; DM; Hypothyroidism, and acute on chronic constipation; CKD stage II;   Here for  f/u on cervical myelopathy.   Hasn't restarted BP meds- which is good- will con't OFF regular BP meds.   2. Increase midodrine to 10 mg up to 3x/day- can take 3rd dose of day if need be, but sit up for 3-4 hours after takes last dose.  If that doesn't work; then call Kidney doctor to see if they have any recommendations.   3. Continue Trazodone- 150 mg nightly- however if cannot sleep, try a 1/2 to 1 dose of Benadryl with Trazodone- it's safe- and that can help you go to sleep better- don't do it every night.  4. Starting therapy again 10/23- for PT and OT- already ordered.    5.   Just got pain meds filled- will wait to fil, since not taking very often- but call me when you need them.   6. DM per Endocrinology- a diabetes doctor.    7. Bring bottle of pain meds to next appointment- just had UDS last visit, so doesn't need right now.    8. F/U with Riley Lam NP for chronic pain meds every 2 months.   9. F/U with me in 4 months.   10. Call me or do Mychart in 2-3 weeks ot let me know how the BP is going.   11. Doesn't have CHF or Kidney function os good, so can add Florinef if need be.

## 2022-12-26 NOTE — Progress Notes (Signed)
Subjective:    Patient ID: Alexandria Webster, female    DOB: Aug 20, 1965, 57 y.o.   MRN: [id number]  HPI  Pt is a 57 yr old female with hx of cervical myelopathy s/p C5-T1 decompression lamintomy and PCDF 07/19/22 by Dr Jordan Likes.  She also has hx of post op pain; COPD, hx of HTN with orthostatic hypotension; DM; Hypothyroidism, and acut eon chronic constipation;  Here for transitional care/hospital f/u on cervical myelopathy.  Had surgery on retinal detachment- B/L eyes-  Vision- can see a little bit better- and a little better day by day.    Is walking with RW some.   Every time takes PT and OT - BP bottoms out- has to lay down it's so low.   Still taking Midodrine first pill when first wakes up- and 2nd one after lunch.  Takes around 8am-   Has been having therapy  But almost passes out and feels like WILL pass out- sweaty, dizzy and has to sit down to avoid passing out.  Sounds like had 1 episode blacked out- where not responsive to daughter- Isaiah Serge.     Walking some- but BP gets in the way.  Mainly uses W/C to get around outside the house and RW in the house.  Daughter is there when she walks- because gets real sweaty- and hot when feels bad.    Pain- sometimes good and sometimes not.  This AM, having more trouble-  taking pain 1x/day- when goes to bed- or 2x/day if has therapy or a doctor's appointment.   Continuing Trazodone- works well.   Seeing Dr Phillips Odor- handling DM, however going to Endo soon- to handle CBGs.   Had to stop therapy due to eyes-   [facility] wants her to wear soft collar when sleeping, riding in a car and therapy.  Was given Flexeril- as needed for muscle spasms- helps some.   Pain Inventory Average Pain 5 Pain Right Now 6 My pain is intermittent, constant, sharp, burning, dull, stabbing, tingling, and aching  In the last 24 hours, has pain interfered with the following? General activity 4 Relation with others 0 Enjoyment of life 10 What TIME of day  is your pain at its worst? morning  Sleep (in general) Fair  Pain is worse with: some activites Pain improves with: rest, therapy/exercise, and medication Relief from Meds: 5  Family History  Problem Relation Age of Onset   Stroke Mother    Hypertension Mother    Heart failure Father    Asthma Father    Diabetes Father    Heart disease Father    Emphysema Paternal Grandfather    Colon cancer Neg Hx    Colon polyps Neg Hx    Social History   Socioeconomic History   Marital status: Single    Spouse name: Not on file   Number of children: 2   Years of education: Not on file   Highest education level: Not on file  Occupational History   Not on file  Tobacco Use   Smoking status: Former    Current packs/day: 0.00    Types: Cigarettes    Quit date: 03/14/1982    Years since quitting: 40.8    Passive exposure: Past   Smokeless tobacco: Current    Types: Snuff  Vaping Use   Vaping status: Never Used  Substance and Sexual Activity   Alcohol use: No   Drug use: No   Sexual activity: Yes    Birth control/protection:  Surgical    Comment: tubal  Other Topics Concern   Not on file  Social History Narrative   Not on file   Social Determinants of Health   Financial Resource Strain: Not on file  Food Insecurity: Not on file  Transportation Needs: Not on file  Physical Activity: Not on file  Stress: Not on file  Social Connections: Not on file   Past Surgical History:  Procedure Laterality Date   AIR/FLUID EXCHANGE Right 11/22/2022   Procedure: AIR/FLUID EXCHANGE;  Surgeon: Carmela Rima, MD;  Location: [facility] OR;  Service: Ophthalmology;  Laterality: Right;   AORTA - BILATERAL FEMORAL ARTERY BYPASS GRAFT N/A 09/27/2018   Procedure: Redo Exposure  AORTOBIFEMORAL Bypass and bilateral femoral Artery ; Redo Aortobifemoral  BYPASS GRAFT.;  Surgeon: Cephus Shelling, MD;  Location: [facility] OR;  Service: Vascular;  Laterality: N/A;   BACK SURGERY     BIOPSY  10/14/2021    Procedure: BIOPSY;  Surgeon: Lanelle Bal, DO;  [facility];  Service: Endoscopy;;   COLONOSCOPY WITH PROPOFOL N/A 10/14/2021   Procedure: COLONOSCOPY WITH PROPOFOL;  Surgeon: Lanelle Bal, DO;  [facility];  Service: Endoscopy;  Laterality: N/A;  8:30am   ESOPHAGOGASTRODUODENOSCOPY (EGD) WITH PROPOFOL N/A 10/14/2021   Procedure: ESOPHAGOGASTRODUODENOSCOPY (EGD) WITH PROPOFOL;  Surgeon: Lanelle Bal, DO;  [facility];  Service: Endoscopy;  Laterality: N/A;   FEMORAL ARTERY STENT  right leg   INJECTION OF SILICONE OIL Left 09/06/2022   Procedure: INJECTION OF SILICONE OIL;  Surgeon: Carmela Rima, MD;  Location: [facility] OR;  Service: Ophthalmology;  Laterality: Left;   INJECTION OF SILICONE OIL Right 11/22/2022   Procedure: INJECTION OF SILICONE OIL;  Surgeon: Carmela Rima, MD;  Location: [facility] OR;  Service: Ophthalmology;  Laterality: Right;   MEMBRANE PEEL Left 09/06/2022   Procedure: MEMBRANECTOMY;  Surgeon: Carmela Rima, MD;  Location: [facility] OR;  Service: Ophthalmology;  Laterality: Left;   PARS PLANA VITRECTOMY Left 09/06/2022   Procedure: PARS PLANA VITRECTOMY WITH 25 GAUGE;  Surgeon: Carmela Rima, MD;  Location: [facility] OR;  Service: Ophthalmology;  Laterality: Left;   PHOTOCOAGULATION WITH LASER Left 09/06/2022   Procedure: PHOTOCOAGULATION WITH LASER;  Surgeon: Carmela Rima, MD;  Location: [facility] OR;  Service: Ophthalmology;  Laterality: Left;   PHOTOCOAGULATION WITH LASER Right 11/22/2022   Procedure: PHOTOCOAGULATION WITH LASER;  Surgeon: Carmela Rima, MD;  Location: [facility] OR;  Service: Ophthalmology;  Laterality: Right;   POSTERIOR CERVICAL FUSION/FORAMINOTOMY N/A 07/19/2022   Procedure: Posterior cervical fusion with lateral mass fixation - Cervical four-cervical five, Cervical five-Cervical six - Cervical six-Cervical seven - Cervical seven-Thoracic one with laminectomy;  Surgeon: Julio Sicks, MD;  Location: [facility] OR;  Service: Neurosurgery;   Laterality: N/A;   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   Procedure: REPAIR OF COMPLEX TRACTION RETINAL DETACHMENT;  Surgeon: Carmela Rima, MD;  Location: [facility] OR;  Service: Ophthalmology;  Laterality: Left;  MAC WITH BLOCK   REPAIR OF COMPLEX TRACTION RETINAL DETACHMENT Right 11/22/2022   Procedure: REPAIR OF COMPLEX TRACTION RETINAL DETACHMENT;  Surgeon: Carmela Rima, MD;  Location: [facility] OR;  Service: Ophthalmology;  Laterality: Right;  MAC WITH BLOCK   TUBAL LIGATION     Past Surgical History:  Procedure Laterality Date   AIR/FLUID EXCHANGE Right 11/22/2022   Procedure: AIR/FLUID EXCHANGE;  Surgeon: Carmela Rima, MD;  Location: [facility] OR;  Service: Ophthalmology;  Laterality: Right;   AORTA - BILATERAL FEMORAL ARTERY BYPASS GRAFT N/A 09/27/2018  Procedure: Redo Exposure  AORTOBIFEMORAL Bypass and bilateral femoral Artery ; Redo Aortobifemoral  BYPASS GRAFT.;  Surgeon: Cephus Shelling, MD;  Location: [facility] OR;  Service: Vascular;  Laterality: N/A;   BACK SURGERY     BIOPSY  10/14/2021   Procedure: BIOPSY;  Surgeon: Lanelle Bal, DO;  [facility];  Service: Endoscopy;;   COLONOSCOPY WITH PROPOFOL N/A 10/14/2021   Procedure: COLONOSCOPY WITH PROPOFOL;  Surgeon: Lanelle Bal, DO;  [facility];  Service: Endoscopy;  Laterality: N/A;  8:30am   ESOPHAGOGASTRODUODENOSCOPY (EGD) WITH PROPOFOL N/A 10/14/2021   Procedure: ESOPHAGOGASTRODUODENOSCOPY (EGD) WITH PROPOFOL;  Surgeon: Lanelle Bal, DO;  [facility];  Service: Endoscopy;  Laterality: N/A;   FEMORAL ARTERY STENT  right leg   INJECTION OF SILICONE OIL Left 09/06/2022   Procedure: INJECTION OF SILICONE OIL;  Surgeon: Carmela Rima, MD;  Location: [facility] OR;  Service: Ophthalmology;  Laterality: Left;   INJECTION OF SILICONE OIL Right 11/22/2022   Procedure: INJECTION OF SILICONE OIL;  Surgeon: Carmela Rima, MD;  Location: [facility] OR;  Service: Ophthalmology;  Laterality:  Right;   MEMBRANE PEEL Left 09/06/2022   Procedure: MEMBRANECTOMY;  Surgeon: Carmela Rima, MD;  Location: [facility] OR;  Service: Ophthalmology;  Laterality: Left;   PARS PLANA VITRECTOMY Left 09/06/2022   Procedure: PARS PLANA VITRECTOMY WITH 25 GAUGE;  Surgeon: Carmela Rima, MD;  Location: [facility] OR;  Service: Ophthalmology;  Laterality: Left;   PHOTOCOAGULATION WITH LASER Left 09/06/2022   Procedure: PHOTOCOAGULATION WITH LASER;  Surgeon: Carmela Rima, MD;  Location: [facility] OR;  Service: Ophthalmology;  Laterality: Left;   PHOTOCOAGULATION WITH LASER Right 11/22/2022   Procedure: PHOTOCOAGULATION WITH LASER;  Surgeon: Carmela Rima, MD;  Location: [facility] OR;  Service: Ophthalmology;  Laterality: Right;   POSTERIOR CERVICAL FUSION/FORAMINOTOMY N/A 07/19/2022   Procedure: Posterior cervical fusion with lateral mass fixation - Cervical four-cervical five, Cervical five-Cervical six - Cervical six-Cervical seven - Cervical seven-Thoracic one with laminectomy;  Surgeon: Julio Sicks, MD;  Location: [facility] OR;  Service: Neurosurgery;  Laterality: N/A;   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   Procedure: REPAIR OF COMPLEX TRACTION RETINAL DETACHMENT;  Surgeon: Carmela Rima, MD;  Location: [facility] OR;  Service: Ophthalmology;  Laterality: Left;  MAC WITH BLOCK   REPAIR OF COMPLEX TRACTION RETINAL DETACHMENT Right 11/22/2022   Procedure: REPAIR OF COMPLEX TRACTION RETINAL DETACHMENT;  Surgeon: Carmela Rima, MD;  Location: [facility] OR;  Service: Ophthalmology;  Laterality: Right;  MAC WITH BLOCK   TUBAL LIGATION     Past Medical History:  Diagnosis Date   Asthma    COPD (chronic obstructive pulmonary disease) (HCC)    DDD (degenerative disc disease), lumbar    Depression    Diabetes mellitus    Gastroparesis    GERD without esophagitis    High cholesterol    Hypertension    Hypothyroidism    PONV (postoperative nausea and vomiting)    PVD (peripheral vascular disease) (HCC)    Wears glasses     BP 139/81   Ht 5\' 4"  (1.626 m)   LMP 01/21/2012   BMI 17.85 kg/m   Opioid Risk Score:   Fall Risk Score:  `1  Depression screen [facility] 2/9     12/26/2022   10:43 AM 08/15/2022    8:47 AM 05/09/2016   11:34 AM 10/14/2015    1:42 PM 09/28/2015    3:08 PM 09/14/2015   10:08 AM  Depression screen PHQ 2/9  Decreased Interest 1 3 0 0 0 0  Down, Depressed, Hopeless 1 2 0 0 0 0  PHQ - 2 Score 2 5 0 0 0 0  Altered sleeping  2      Tired, decreased energy  2      Change in appetite  3      Feeling bad or failure about yourself   0      Trouble concentrating  3      Moving slowly or fidgety/restless  0      Suicidal thoughts  0      PHQ-9 Score  15        Review of Systems  Musculoskeletal:  Positive for neck pain.       Hands, pain both shoulder, right upper leg pain  All other systems reviewed and are negative.      Objective:   Physical Exam Awake, alert, appropriate, accompanied by daughter, NAD In transport w/c- MS: RUE- biceps/triceps 4+/5; WE 4+/5; grip 4/5; and FA 4-/5 LUE- biceps, triceps 4/5;  WE 4/5; Gri 3+/5 and FA 2/5 RLE- HF 4-/5; KE 4/5; DF and PF 4/5 LLE- HF 4-/5; KE 4/5; DF 4-/5 and PF 4/5  Neuro: Decreased to light touch LUE C6 down to T1 3+ DTRs in LE's No hoffman's B/L No clonus- no increased tone     Assessment & Plan:   Pt is a 57 yr old female with hx of cervical myelopathy s/p C5-T1 decompression lamintomy and PCDF 07/19/22 by Dr Jordan Likes.  She also has hx of post op pain; COPD, hx of HTN with orthostatic hypotension; DM; Hypothyroidism, and acute on chronic constipation; CKD stage II;   Here for  f/u on cervical myelopathy.   Hasn't restarted BP meds- which is good- will con't OFF regular BP meds.   2. Increase midodrine to 10 mg up to 3x/day- can take 3rd dose of day if need be, but sit up for 3-4 hours after takes last dose.  If that doesn't work; then call Kidney doctor to see if they have any recommendations.   3. Continue Trazodone- 150 mg  nightly- however if cannot sleep, try a 1/2 to 1 dose of Benadryl with Trazodone- it's safe- and that can help you go to sleep better- don't do it every night.  4. Starting therapy again 10/23- for PT and OT- already ordered.    5.   Just got pain meds filled- will wait to fil, since not taking very often- but call me when you need them.   6. DM per Endocrinology- a diabetes doctor.    7. Bring bottle of pain meds to next appointment- just had UDS last visit, so doesn't need right now.    8. F/U with Riley Lam NP for chronic pain meds every 2 months.   9. F/U with me in 4 months.   10. Call me or do Mychart in 2-3 weeks ot let me know how the BP is going.   11. Doesn't have CHF or Kidney function os good, so can add Florinef if need be.    I spent a total of 31   minutes on total care today- >50% coordination of care- due to d/w pt about BP issues; as well as pain , sleep and DM as well as plan for orthostaitc hypotension- also d/w pt's daughter

## 2023-01-04 ENCOUNTER — Encounter (HOSPITAL_COMMUNITY): Payer: Self-pay | Admitting: Occupational Therapy

## 2023-01-04 ENCOUNTER — Ambulatory Visit (HOSPITAL_COMMUNITY): Payer: 59 | Admitting: Occupational Therapy

## 2023-01-04 ENCOUNTER — Ambulatory Visit (HOSPITAL_COMMUNITY): Payer: 59 | Attending: Physical Medicine and Rehabilitation

## 2023-01-04 DIAGNOSIS — R29818 Other symptoms and signs involving the nervous system: Secondary | ICD-10-CM

## 2023-01-04 DIAGNOSIS — R2689 Other abnormalities of gait and mobility: Secondary | ICD-10-CM | POA: Diagnosis present

## 2023-01-04 DIAGNOSIS — R278 Other lack of coordination: Secondary | ICD-10-CM

## 2023-01-04 DIAGNOSIS — G959 Disease of spinal cord, unspecified: Secondary | ICD-10-CM | POA: Diagnosis present

## 2023-01-04 DIAGNOSIS — M6281 Muscle weakness (generalized): Secondary | ICD-10-CM | POA: Diagnosis present

## 2023-01-04 NOTE — Therapy (Signed)
OUTPATIENT PHYSICAL THERAPY LOWER EXTREMITY TREATMENT/ Reasses visit   Patient Name: Alexandria Webster MRN: [id number] DOB:1965/07/18, 57 y.o., female Today's Date: 01/04/2023    END OF SESSION:   PT End of Session - 01/04/23 0933     Visit Number 1    Number of Visits 6    Date for PT Re-Evaluation 02/15/23    Authorization Type Primary: [employer] Secondary: Medicaid [employer]    PT Start Time [phone]    PT Stop Time 1012    PT Time Calculation (min) 39 min    Equipment Utilized During Treatment Gait belt    Activity Tolerance Patient tolerated treatment well    Behavior During Therapy WFL for tasks assessed/performed              Past Medical History:  Diagnosis Date   Asthma    COPD (chronic obstructive pulmonary disease) (HCC)    DDD (degenerative disc disease), lumbar    Depression    Diabetes mellitus    Gastroparesis    GERD without esophagitis    High cholesterol    Hypertension    Hypothyroidism    PONV (postoperative nausea and vomiting)    PVD (peripheral vascular disease) (HCC)    Wears glasses    Past Surgical History:  Procedure Laterality Date   AIR/FLUID EXCHANGE Right 11/22/2022   Procedure: AIR/FLUID EXCHANGE;  Surgeon: Carmela Rima, MD;  Location: [facility] OR;  Service: Ophthalmology;  Laterality: Right;   AORTA - BILATERAL FEMORAL ARTERY BYPASS GRAFT N/A 09/27/2018   Procedure: Redo Exposure  AORTOBIFEMORAL Bypass and bilateral femoral Artery ; Redo Aortobifemoral  BYPASS GRAFT.;  Surgeon: Cephus Shelling, MD;  Location: [facility] OR;  Service: Vascular;  Laterality: N/A;   BACK SURGERY     BIOPSY  10/14/2021   Procedure: BIOPSY;  Surgeon: Lanelle Bal, DO;  [facility];  Service: Endoscopy;;   COLONOSCOPY WITH PROPOFOL N/A 10/14/2021   Procedure: COLONOSCOPY WITH PROPOFOL;  Surgeon: Lanelle Bal, DO;  [facility];  Service: Endoscopy;  Laterality: N/A;  8:30am   ESOPHAGOGASTRODUODENOSCOPY (EGD) WITH PROPOFOL N/A  10/14/2021   Procedure: ESOPHAGOGASTRODUODENOSCOPY (EGD) WITH PROPOFOL;  Surgeon: Lanelle Bal, DO;  [facility];  Service: Endoscopy;  Laterality: N/A;   FEMORAL ARTERY STENT  right leg   INJECTION OF SILICONE OIL Left 09/06/2022   Procedure: INJECTION OF SILICONE OIL;  Surgeon: Carmela Rima, MD;  Location: [facility] OR;  Service: Ophthalmology;  Laterality: Left;   INJECTION OF SILICONE OIL Right 11/22/2022   Procedure: INJECTION OF SILICONE OIL;  Surgeon: Carmela Rima, MD;  Location: [facility] OR;  Service: Ophthalmology;  Laterality: Right;   MEMBRANE PEEL Left 09/06/2022   Procedure: MEMBRANECTOMY;  Surgeon: Carmela Rima, MD;  Location: [facility] OR;  Service: Ophthalmology;  Laterality: Left;   PARS PLANA VITRECTOMY Left 09/06/2022   Procedure: PARS PLANA VITRECTOMY WITH 25 GAUGE;  Surgeon: Carmela Rima, MD;  Location: [facility] OR;  Service: Ophthalmology;  Laterality: Left;   PHOTOCOAGULATION WITH LASER Left 09/06/2022   Procedure: PHOTOCOAGULATION WITH LASER;  Surgeon: Carmela Rima, MD;  Location: [facility] OR;  Service: Ophthalmology;  Laterality: Left;   PHOTOCOAGULATION WITH LASER Right 11/22/2022   Procedure: PHOTOCOAGULATION WITH LASER;  Surgeon: Carmela Rima, MD;  Location: [facility] OR;  Service: Ophthalmology;  Laterality: Right;   POSTERIOR CERVICAL FUSION/FORAMINOTOMY N/A 07/19/2022   Procedure: Posterior cervical fusion with lateral mass fixation - Cervical four-cervical five, Cervical five-Cervical six - Cervical six-Cervical seven - Cervical  seven-Thoracic one with laminectomy;  Surgeon: Julio Sicks, MD;  Location: [facility] OR;  Service: Neurosurgery;  Laterality: N/A;   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   Procedure: REPAIR OF COMPLEX TRACTION RETINAL DETACHMENT;  Surgeon: Carmela Rima, MD;  Location: [facility] OR;  Service: Ophthalmology;  Laterality: Left;  MAC WITH BLOCK   REPAIR OF COMPLEX TRACTION RETINAL DETACHMENT Right 11/22/2022   Procedure: REPAIR OF COMPLEX  TRACTION RETINAL DETACHMENT;  Surgeon: Carmela Rima, MD;  Location: [facility] OR;  Service: Ophthalmology;  Laterality: Right;  MAC WITH BLOCK   TUBAL LIGATION     Patient Active Problem List   Diagnosis Date Noted   Post-operative pain 08/15/2022   Orthostatic hypotension 08/15/2022   Impaired gait and mobility 08/15/2022   Mild cognitive impairment 08/01/2022   Cervical myelopathy (HCC) 07/22/2022   Stenosis of cervical spine with myelopathy (HCC) 07/19/2022   Degeneration of cervical intervertebral disc 08/08/2021   Syncope and collapse 08/06/2021   Hypomagnesemia 08/06/2021   Hypothyroidism 08/06/2021   Allergic rhinitis 08/06/2021   AKI (acute kidney injury) (HCC) 08/06/2021   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/27/2018   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   Gastritis 10/01/2015   Gastroparesis due to DM (HCC) 10/01/2015   Personal history of noncompliance with medical treatment, presenting hazards to health 09/14/2015   Hypokalemia 11/13/2012   Constipation 11/09/2012   Facial cellulitis 11/07/2012   Leukocytosis 11/07/2012   Type 2 diabetes mellitus (HCC) 11/07/2012   Asthma 11/07/2012   PVD (peripheral vascular disease) (HCC) 11/07/2012   Hyponatremia 11/07/2012   Tachycardia 11/07/2012   Nausea & vomiting 11/07/2012   Essential hypertension, benign     PCP: Assunta Found MD  REFERRING PROVIDER: Genice Rouge, MD  REFERRING DIAG: G95.9 (ICD-10-CM) - Cervical myelopathy (HCC); Cervical myelopathy- with UE and LE weakness- since Cervical decompression and fusion- C5-T1-  THERAPY DIAG:  Cervical myelopathy (HCC) - Plan: PT plan of care cert/re-cert  Other lack of coordination - Plan: PT plan of care cert/re-cert  Other abnormalities of gait and mobility - Plan: PT plan of care cert/re-cert  Muscle weakness (generalized) - Plan: PT plan of care cert/re-cert  Other symptoms and signs  involving the nervous system - Plan: PT plan of care cert/re-cert  Rationale for Evaluation and Treatment: Rehabilitation  ONSET DATE: DOS 07/19/22 Next apt  Dr Jordan Likes spinal surgeon 11/04/22  SUBJECTIVE:   SUBJECTIVE STATEMENT: Patient reports to PT for the first time since R eye surgery 9/10. Will follow up for the L eye later this month. Has noticed increased difficulty with sit to stand due to weakness and BP changes. Notices getting hot and sweaty when standing and walking for a prolonged period.   Patient denies any pain today but claims that she's very tired as she did not sleep good last night. Was supposed to have eye surgery but the facility has to re-schedule it.  Eval:  Patient states she fell last year and had issues with mobility following. She then had surgery to fix myelopathy and went to inpatient rehab. Patient states she can walk with a RW now but balance is not good. She is limited with her walking ability due to weakness and endurance. She has trouble with steps to get in/out of the house. Left leg wants to fold up under her sometimes with walking.   PERTINENT HISTORY: hx of cervical myelopathy s/p C5-T1 decompression lamintomy and PCDF 07/19/22, hx of COPD, HTN,  orthostatic hypotension, DM,PVD, CIR from 07/22/22 -08/05/22 PAIN:  Are you having pain? No  PRECAUTIONS: Fall and Other: possible cervical ?  WEIGHT BEARING RESTRICTIONS: No  FALLS:  Has patient fallen in last 6 months? No  OCCUPATION: disabled  PLOF: Independent  PATIENT GOALS: to walk   OBJECTIVE: (objective measures from initial evaluation unless otherwise dated)   COGNITION: Overall cognitive status: Within functional limits for tasks assessed     SENSATION: WFL    POSTURE: rounded shoulders, forward head, and increased thoracic kyphosis(in neck brace - soft collar)    LOWER EXTREMITY ROM: WFL for tasks assessed   LOWER EXTREMITY MMT:  MMT Right eval Right 01/04/23 Left eval  Left 01/04/23  Hip flexion 4 4 4- 4-  Hip extension      Hip abduction      Hip adduction      Hip internal rotation      Hip external rotation      Knee flexion 4- 4 (tested in sitting midrange) 4 4- (Tested in sitting midrange)  Knee extension 4 4 4  4-  Ankle dorsiflexion 4 4 3+ 4  Ankle plantarflexion      Ankle inversion      Ankle eversion       (Blank rows = not tested) *= pain/symptoms    FUNCTIONAL TESTS:  5 times sit to stand: 36.13 seconds with UE support Timed up and go (TUG): 37.46 seconds with RW 2 minute walk test: 75 feet with RW, had to stop at 1 minute due to fatigue   GAIT: Distance walked: 100 feet Assistive device utilized: Walker - 2 wheeled Level of assistance: CGA Comments: ; trunk flexed,  had to stop at 1 minute due to fatigue   TODAY'S TREATMENT:                                                                                                                              DATE:  01/04/23 Progress note 5 times sit to stand 42.33 sec using hands to assist up to standing; CGA for safety TUG 40.97 2 MWT 195 ft with RW and CGA and WC following MMT's see above HEP review   11/18/22 Ambulation with RW CGA 1 lap around the clinic with 2 seated rest breaks (around 75 ft) Standing with B UE support:  Heel raises x 1 lb x 2  x 10  High marches x 1 lb x 2 x 10 Vital signs monitoring  11/09/22 Ambulation with RW 120', 75', 60' with rest between Standing:  all completed with bil UE assist marching alternating 10X  Heelraises 15X  Hip abduction 10X each  Hip extension 10X each Sitting:  sit to stands 5X 2 sets no UE from standard chair  LAQ 10X each LE  Cervical ROM all directions 5X each  11/02/22 Ambulation with RW 88', 92' with rest between Standing:  marching alternating 8X  Heelraises 8X  Hip abduction 5X each  Hip extension 5X each Sitting:  sit to stands with each exercise 10X approx  LAQ 10X each LE  Alternating march 10X  each  Cervical holding upright in seated 5X 5" holds  Cervical rotation 5X each Supine:  bridge 10X  SLR 10X each  Hip abduction with RTB 10X5"   10/31/22: Reviewed goals Educated importance of HEP complaince Pt able to recall, admits only doing LAQ currently not began STS yet  STS 6 times prior fatigue with HHA LAQ 4 times prior fatigue Seated scapular retraction 10x 5" Standing: March alternating 8x with HHA Heel raises 7x with UE support  Ambulate RW 59ft  Nustep UE/LE x 3 min prior fatigue   10/27/22 Eval and HEP     PATIENT EDUCATION:  Education details: Patient educated on exam findings, POC, scope of PT, HEP, Person educated: Patient Education method: Programmer, multimedia, Demonstration, and Handouts Education comprehension: verbalized understanding, returned demonstration, verbal cues required, and tactile cues required  HOME EXERCISE PROGRAM: Access Code: [email or web address] URL: [email or web address]   Date: 10/27/2022 - Sit to Stand  - 3 x daily - 7 x weekly - 2 sets - 10 reps - Seated Long Arc Quad  - 3 x daily - 7 x weekly - 2 sets - 10 reps - 5-10 second hold  10/31/22: -seated posture -scapular retraction  Date: 11/02/2022 - Seated March  - 3 x daily - 7 x weekly - 2 sets - 10 reps - Supine Bridge  - 3 x daily - 7 x weekly - 2 sets - 10 reps - Hooklying Clamshell with Resistance  - 3 x daily - 7 x weekly - 2 sets - 10 reps - 5 sec hold - Supine Active Straight Leg Raise  - 3 x daily - 7 x weekly - 2 sets - 10 reps  ASSESSMENT:  CLINICAL IMPRESSION: Patient returns today after hiatus for right eye surgery.  Reassement performed. Patient continues with demonstrated muscle weakness, reduced functional mobility, and balance impairments which are negatively impacting patient ability to perform ADLs and functional mobility tasks. Patient will benefit from skilled physical therapy services to address these deficits to reduce pain and improve level of function  with ADLs and functional mobility tasks and to address remaining unmet and partially unmet goals.     Eval:  Patient a 57 y.o. y.o. female who was seen today for physical therapy evaluation and treatment for weakness due to hx of cervical myelopathy. Patient presents with pain limited deficits in bilateral LE strength, ROM, endurance, activity tolerance, gait, balance, posture,  and functional mobility with ADL. Patient is having to modify and restrict ADL as indicated by subjective information and objective measures which is affecting overall participation. Patient will benefit from skilled physical therapy in order to improve function and reduce impairment.  Returns to MD 8/23 in hopes to have brace removed.   OBJECTIVE IMPAIRMENTS: decreased activity tolerance, decreased balance, decreased endurance, decreased mobility, difficulty walking, decreased ROM, decreased strength, increased muscle spasms, impaired flexibility, improper body mechanics, postural dysfunction, and pain.   ACTIVITY LIMITATIONS: carrying, lifting, bending, standing, squatting, stairs, transfers, locomotion level, and caring for others  PARTICIPATION LIMITATIONS: meal prep, cleaning, laundry, shopping, community activity, and yard work  PERSONAL FACTORS: Fitness, Time since onset of injury/illness/exacerbation, and 3+ comorbidities: hx cervical myelopathy from fall, cervical fusion C5-T1, COPD, DM, HTN, PVD  are also affecting patient's functional outcome.   REHAB POTENTIAL: Good  CLINICAL DECISION MAKING: Evolving/moderate complexity  EVALUATION COMPLEXITY: Moderate   GOALS: Goals  reviewed with patient? Yes  SHORT TERM GOALS: Target date: 01/25/23    Patient will be independent with HEP in order to improve functional outcomes. Baseline: Goal status: IN PROGRESS  2.  Patient will report at least 25% improvement in symptoms for improved quality of life. Baseline: Goal status: IN PROGRESS    LONG TERM GOALS:  Target date: 02/15/2023    Timed up and go (TUG): 37.46 seconds with RW  Patient will report at least 75% improvement in symptoms for improved quality of life. Baseline:  Goal status: IN PROGRESS  2.  Patient will improve Timed up and go (TUG) time to less than 20 seconds in order to improved safe ambulation ability. Baseline: 37.46 seconds with RW Goal status: IN PROGRESS  3.  Patient will be able to navigate stairs with reciprocal pattern without compensation in order to demonstrate improved LE strength. Baseline: patient states impaired and requires assistance Goal status: IN PROGRESS  4.  Patient will be able to ambulate at least 226 feet in in order to demonstrate improved tolerance to activity. Baseline: 195 feet with RW 01/04/23 Goal status: IN PROGRESS  5.  Patient will be able to complete 5x STS in under 20 seconds in order to reduce the risk of falls. Baseline: 36.13 seconds with UE support; 01/04/23 42.33 sec Goal status: IN PROGRESS  6.  Patient will demonstrate grade of 4+/5 MMT grade in all tested musculature as evidence of improved strength to assist with stair ambulation and gait.   Baseline: see above Goal status: IN PROGRESS   PLAN:  PT FREQUENCY: 1 x  PT DURATION: 6 weeks  PLANNED INTERVENTIONS: Therapeutic exercises, Therapeutic activity, Neuromuscular re-education, Balance training, Gait training, Patient/Family education, Joint manipulation, Joint mobilization, Stair training, Orthotic/Fit training, DME instructions, Aquatic Therapy, Dry Needling, Electrical stimulation, Spinal manipulation, Spinal mobilization, Cryotherapy, Moist heat, Compression bandaging, scar mobilization, Splintting, Taping, Traction, Ultrasound, Ionotophoresis 4mg /ml Dexamethasone, and Manual therapy  PLAN FOR NEXT SESSION: Continue POC and may progress as tolerated with emphasis on increasing bilateral LE/functional strengthening,  activity tolerance, posture and cervical  strength.    10:24 AM, 01/04/23 Quincey Quesinberry Small Henri Guedes MPT [facility] [place] [phone]

## 2023-01-04 NOTE — Therapy (Signed)
OUTPATIENT OCCUPATIONAL THERAPY NEURO TREATMENT  Patient Name: Alexandria Webster MRN: [id number] DOB:June 25, 1965, 57 y.o., female Today's Date: 01/04/2023  PCP: Assunta Found, MD REFERRING PROVIDER: Genice Rouge, MD  END OF SESSION:  OT End of Session - 01/04/23 1007     Visit Number 3    Number of Visits 13    Date for OT Re-Evaluation 12/16/22    Authorization Type Monia Pouch, [employer]    OT Start Time [phone]    OT Stop Time 0935    OT Time Calculation (min) 43 min    Activity Tolerance Patient tolerated treatment well    Behavior During Therapy [facility] for tasks assessed/performed              Past Medical History:  Diagnosis Date   Asthma    COPD (chronic obstructive pulmonary disease) (HCC)    DDD (degenerative disc disease), lumbar    Depression    Diabetes mellitus    Gastroparesis    GERD without esophagitis    High cholesterol    Hypertension    Hypothyroidism    PONV (postoperative nausea and vomiting)    PVD (peripheral vascular disease) (HCC)    Wears glasses    Past Surgical History:  Procedure Laterality Date   AIR/FLUID EXCHANGE Right 11/22/2022   Procedure: AIR/FLUID EXCHANGE;  Surgeon: Carmela Rima, MD;  Location: [facility] OR;  Service: Ophthalmology;  Laterality: Right;   AORTA - BILATERAL FEMORAL ARTERY BYPASS GRAFT N/A 09/27/2018   Procedure: Redo Exposure  AORTOBIFEMORAL Bypass and bilateral femoral Artery ; Redo Aortobifemoral  BYPASS GRAFT.;  Surgeon: Cephus Shelling, MD;  Location: [facility] OR;  Service: Vascular;  Laterality: N/A;   BACK SURGERY     BIOPSY  10/14/2021   Procedure: BIOPSY;  Surgeon: Lanelle Bal, DO;  Location: [facility];  Service: Endoscopy;;   COLONOSCOPY WITH PROPOFOL N/A 10/14/2021   Procedure: COLONOSCOPY WITH PROPOFOL;  Surgeon: Lanelle Bal, DO;  Location: [facility];  Service: Endoscopy;  Laterality: N/A;  8:30am   ESOPHAGOGASTRODUODENOSCOPY (EGD) WITH PROPOFOL N/A 10/14/2021   Procedure:  ESOPHAGOGASTRODUODENOSCOPY (EGD) WITH PROPOFOL;  Surgeon: Lanelle Bal, DO;  Location: [facility];  Service: Endoscopy;  Laterality: N/A;   FEMORAL ARTERY STENT  right leg   INJECTION OF SILICONE OIL Left 09/06/2022   Procedure: INJECTION OF SILICONE OIL;  Surgeon: Carmela Rima, MD;  Location: [facility] OR;  Service: Ophthalmology;  Laterality: Left;   INJECTION OF SILICONE OIL Right 11/22/2022   Procedure: INJECTION OF SILICONE OIL;  Surgeon: Carmela Rima, MD;  Location: [facility] OR;  Service: Ophthalmology;  Laterality: Right;   MEMBRANE PEEL Left 09/06/2022   Procedure: MEMBRANECTOMY;  Surgeon: Carmela Rima, MD;  Location: [facility] OR;  Service: Ophthalmology;  Laterality: Left;   PARS PLANA VITRECTOMY Left 09/06/2022   Procedure: PARS PLANA VITRECTOMY WITH 25 GAUGE;  Surgeon: Carmela Rima, MD;  Location: [facility] OR;  Service: Ophthalmology;  Laterality: Left;   PHOTOCOAGULATION WITH LASER Left 09/06/2022   Procedure: PHOTOCOAGULATION WITH LASER;  Surgeon: Carmela Rima, MD;  Location: [facility] OR;  Service: Ophthalmology;  Laterality: Left;   PHOTOCOAGULATION WITH LASER Right 11/22/2022   Procedure: PHOTOCOAGULATION WITH LASER;  Surgeon: Carmela Rima, MD;  Location: [facility] OR;  Service: Ophthalmology;  Laterality: Right;   POSTERIOR CERVICAL FUSION/FORAMINOTOMY N/A 07/19/2022   Procedure: Posterior cervical fusion with lateral mass fixation - Cervical four-cervical five, Cervical five-Cervical six - Cervical six-Cervical seven - Cervical seven-Thoracic one with laminectomy;  Surgeon: Julio Sicks, MD;  Location: [facility] OR;  Service: Neurosurgery;  Laterality: N/A;   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   Procedure: REPAIR OF COMPLEX TRACTION RETINAL DETACHMENT;  Surgeon: Carmela Rima, MD;  Location: [facility] OR;  Service: Ophthalmology;  Laterality: Left;  MAC WITH BLOCK   REPAIR OF COMPLEX TRACTION RETINAL DETACHMENT Right 11/22/2022   Procedure: REPAIR OF COMPLEX TRACTION RETINAL DETACHMENT;   Surgeon: Carmela Rima, MD;  Location: [facility] OR;  Service: Ophthalmology;  Laterality: Right;  MAC WITH BLOCK   TUBAL LIGATION     Patient Active Problem List   Diagnosis Date Noted   Post-operative pain 08/15/2022   Orthostatic hypotension 08/15/2022   Impaired gait and mobility 08/15/2022   Mild cognitive impairment 08/01/2022   Cervical myelopathy (HCC) 07/22/2022   Stenosis of cervical spine with myelopathy (HCC) 07/19/2022   Degeneration of cervical intervertebral disc 08/08/2021   Syncope and collapse 08/06/2021   Hypomagnesemia 08/06/2021   Hypothyroidism 08/06/2021   Allergic rhinitis 08/06/2021   AKI (acute kidney injury) (HCC) 08/06/2021   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/27/2018   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   Gastritis 10/01/2015   Gastroparesis due to DM (HCC) 10/01/2015   Personal history of noncompliance with medical treatment, presenting hazards to health 09/14/2015   Hypokalemia 11/13/2012   Constipation 11/09/2012   Facial cellulitis 11/07/2012   Leukocytosis 11/07/2012   Type 2 diabetes mellitus (HCC) 11/07/2012   Asthma 11/07/2012   PVD (peripheral vascular disease) (HCC) 11/07/2012   Hyponatremia 11/07/2012   Tachycardia 11/07/2012   Nausea & vomiting 11/07/2012   Essential hypertension, benign     ONSET DATE: 07/19/22 - Cervical Fusion of C5-T1  REFERRING DIAG: Cervical Myopathy with UE weakness  THERAPY DIAG:  Other lack of coordination - Plan: Ot plan of care cert/re-cert  Other symptoms and signs involving the nervous system - Plan: Ot plan of care cert/re-cert  Rationale for Evaluation and Treatment: Rehabilitation  SUBJECTIVE:   SUBJECTIVE STATEMENT: S: "The left is still the numb and weak one" Pt accompanied by: self and family member  PERTINENT HISTORY: hx of cervical myelopathy s/p C5-T1 decompression lamintomy and PCDF 07/19/22, hx of COPD, HTN,  orthostatic hypotension, DM,PVD, CIR from 07/22/22 -08/05/22   PRECAUTIONS: Cervical  WEIGHT BEARING RESTRICTIONS: Yes <10lbs  PAIN:  Are you having pain? Yes: NPRS scale: 5/10 Pain location: neck Pain description: sharp and radiating Aggravating factors: a lot of movement and sitting unsupported Relieving factors: support and positioning  FALLS: Has patient fallen in last 6 months? No  LIVING ENVIRONMENT: Lives with: lives with their daughter Lives in: House/apartment Stairs: Yes: External: 5 steps; can reach both Has following equipment at home: Single point cane, Walker - 2 wheeled, Wheelchair (manual), shower chair, bed side commode, and Grab bars  PLOF: Independent with basic ADLs and Independent with household mobility with device  PATIENT GOALS: "To get independent"  OBJECTIVE:   HAND DOMINANCE: Right  ADLs: Overall ADLs: 75% assist with all dressing and bathing, unable to step into shower, has to sponge bathe. Total assist with all IADL's.  MOBILITY STATUS: Needs Assist: min to mod assist  POSTURE COMMENTS:  rounded shoulders, forward head, and increased thoracic kyphosis Sitting balance: Sits without UE support up to 30 sec  ACTIVITY TOLERANCE: Activity tolerance: poor activity tolerance with all movements and activities.  FUNCTIONAL OUTCOME MEASURES: FOTO: 43.76  UPPER EXTREMITY ROM:    Active ROM Right eval Left eval Right 01/04/23  Left 01/04/23  Shoulder flexion 135 123 132 127  Shoulder abduction 143 126 150 134  Shoulder internal rotation 90 90 90 90  Shoulder external rotation 21 54 42 37  (Blank rows = not tested)  UPPER EXTREMITY MMT:     MMT Right eval Left eval Right 01/04/23 Left 01/04/23  Shoulder flexion 3+/5 3+/5 4/5 4-/5  Shoulder abduction 3+/5 3/5 4-/5 3/5  Shoulder adduction 4/5 4/5 4+/5 4+/5  Shoulder extension 4/5 4-/5 4+/5 4+/5  Shoulder internal rotation 4+/5 4+/5 5/5 5/5  Shoulder external rotation 4/5 4-/5 4/5 4-/5   Elbow flexion 4-/5 4/5 4+/5 4+/5  Elbow extension 4/5 4/5 4/5 4-/5  Wrist flexion 4/5 4-/5 4+/5 4-/5  Wrist extension 4-/5 4-/5 4-/5 3/5  Wrist ulnar deviation 4/5 4/5 4-/5 4/5  Wrist radial deviation 4/5 4/5 4+/5 3+/5  Wrist pronation 4+/5 4+/5 4/5 3/5  Wrist supination 4+/5 4+/5 4/5 4-/5  (Blank rows = not tested)  HAND FUNCTION: Grip strength: Right: 24 lbs; Left: 11 lbs, Lateral pinch: Right: 5 lbs, Left: 1 lbs, and 3 point pinch: Right: 4 lbs, Left: 1 lbs 01/04/23: Grip strength: Right: 21 lbs; Left: 12 lbs, Lateral pinch: Right: 8 lbs, Left: 2 lbs, and 3 point pinch: Right: 6 lbs, Left: 1 lbs   COORDINATION: 9 Hole Peg test: Right: 1' 14 sec; Left: 3' 16 sec *Limited by vision* 01/04/23:9 Hole Peg test: Right: 1' 04 sec; Left: 1' 40 sec  SENSATION: Per pt report RUE gets mildly numb approximately 1-2 times per week, LUE gets severely heavy, numb, and tingly intermittently throughout the day  EDEMA: No swelling noted in UE  COGNITION: Overall cognitive status: Impaired  VISION: Subjective report: I just had 1 eye surgery and I have another one scheduled next month.  Baseline vision: Wears glasses all the time Visual history: retinopathy  OBSERVATIONS: Pt presents with UE tremors and ataxia.   TODAY'S TREATMENT:                                                                                                                              DATE:   01/04/23 -Shoulder A/ROM: seated, flexion, abduction, protraction, horizontal abduction, er/IR, x10 -Wrist A/ROM: flexion, extension, ulnar/radial deviation, supination/pronation, x10 -Digit ROM: composite flexion, abduction/adduction, opposition, finger taps, x10 -9 hole peg test -Measurements for Reassessment  11/02/22 -A/ROM: shoulder, supine-protraction, flexion, abduction, horizontal abduction, 10 reps -Proximal shoulder strengthening: supine-paddles, criss cross, circles to the inside, 10 reps, 2 rest breaks -Hand  gripper: right hand, 20#, large and medium beads horizontal; left hand 7#, large beads horizontal -Pinch tree: pt using right hand and 3 point pinch to place red clothespins along middle and top horizontal bar of pinch tree; attempted with left hand, only able to place 2 yellow clothespins before too fatigued to continue   PATIENT EDUCATION: Education details: Shoulder A/ROM, Wrist ROM, Digit ROM Person educated: Patient Education method: Explanation, Demonstration, and Handouts Education comprehension: verbalized understanding  and returned demonstration  HOME EXERCISE PROGRAM: 8/15: A/ROM 10/23: Shoulder A/ROM, Wrist ROM, Digit ROM   GOALS: Goals reviewed with patient? Yes  SHORT TERM GOALS: Target date: 12/16/22  Pt will be provided and educated on HEP to improve BUE mobility for independent ADL's.   Goal status: IN PROGRESS  2.  Pt will decrease BUE fascial restrictions to minimal amounts or less, in order to complete reaching tasks.   Goal status: IN PROGRESS  3.  Pt will increase BUE ROM to 150 flexion and abduction, as well as 55 external rotation, in order to reach overhead and behind her back during dressing and bathing tasks.   Goal status: IN PROGRESS  4.  Pt will increase BUE strength to 4+/5 in order to lift and carry items during cooking and cleaning tasks.   Goal status: IN PROGRESS  5.  Pt will increase grip strength by 7# and pinch strength by 3# in order to grip and carry items for grooming and bathing tasks.   Goal status: IN PROGRESS  6.  Pt will increase BUE 9 hole peg test  to 45 seconds or less in order to improve coordination to manipulate and utilize buttons, zippers, and hooks during dressing tasks.   Goal status: IN PROGRESS   ASSESSMENT:  CLINICAL IMPRESSION: This session pt returned to therapy following getting her blood pressure stabilized and eye surgery. She completed reassessment for recertification. At this time, pt continuing to have  severe numbness in her L hand, as well as weakness in BUE. OT recommending continuing skilled therapy for 1 time per week for 6 weeks for improving strengthening, endurance, and fine motor skills. Verbal and tactile cuing provided for positioning and technique.   PERFORMANCE DEFICITS: in functional skills including ADLs, IADLs, coordination, dexterity, sensation, ROM, strength, pain, fascial restrictions, Fine motor control, Gross motor control, balance, body mechanics, endurance, and UE functional use, cognitive skills including energy/drive, memory, problem solving, and safety awareness, and psychosocial skills including environmental adaptation, interpersonal interactions, and routines and behaviors.      PLAN:  OT FREQUENCY: 2x/week  OT DURATION: 6 weeks  PLANNED INTERVENTIONS: self care/ADL training, therapeutic exercise, therapeutic activity, neuromuscular re-education, manual therapy, passive range of motion, functional mobility training, electrical stimulation, moist heat, patient/family education, energy conservation, coping strategies training, and DME and/or AE instructions  RECOMMENDED OTHER SERVICES: PT   CONSULTED AND AGREED WITH PLAN OF CARE: Patient and family member/caregiver  PLAN FOR NEXT SESSION: AA/ROM, A/ROM, Coordination, Grip and Pinch strengthening, UE strengthening   Trish Mage, OTR/L [phone] 01/04/2023, 10:08 AM

## 2023-01-04 NOTE — Patient Instructions (Signed)
Repeat all exercises 10-15 times, 1-2 times per day.  1) Shoulder Protraction    Begin with elbows by your side, slowly "punch" straight out in front of you.      2) Shoulder Flexion  Supine:     Standing:         Begin with arms at your side with thumbs pointed up, slowly raise both arms up and forward towards overhead.               3) Horizontal abduction/adduction  Supine:   Standing:           Begin with arms straight out in front of you, bring out to the side in at "T" shape. Keep arms straight entire time.                 4) Internal & External Rotation   Supine:     Standing:     Stand with elbows at the side and elbows bent 90 degrees. Move your forearms away from your body, then bring back inward toward the body.     5) Shoulder Abduction  Supine:     Standing:       Lying on your back begin with your arms flat on the table next to your side. Slowly move your arms out to the side so that they go overhead, in a jumping jack or snow angel movement.   AROM Exercises   1) Wrist Flexion  Start with wrist at edge of table, palm facing up. With wrist hanging slightly off table, curl wrist upward, and back down.      2) Wrist Extension  Start with wrist at edge of table, palm facing down. With wrist slightly off the edge of the table, curl wrist up and back down.      3) Radial Deviations  Start with forearm flat against a table, wrist hanging slightly off the edge, and palm facing the wall. Bending at the wrist only, and keeping palm facing the wall, bend wrist so fist is pointing towards the floor, back up to start position, and up towards the ceiling. Return to start.        4) WRIST PRONATION  Turn your forearm towards palm face down.  Keep your elbow bent and by the side of your  Body.      5) WRIST SUPINATION  Turn your forearm towards palm face up.  Keep your elbow bent and by the side of your   Body.      *Complete exercises 10-15 times each, 2-3 times per day*  Complete each exercise 10-15X, 2-3X/day  1) Towel crunch Place a small towel on a firm table top. Flatten out the towel and then place your hand on one end of it.  Next, flex your fingers 2-5 (index finger through pinky finger) as you pull the towel towards your hand.    2) Digit composite flexion/adduction (make a fist) Hold your hand up as shown. Open and close your hand into a fist and repeat. If you cannot make a full fist, then make a partial fist.    3) Thumb/finger opposition Touch the tip of the thumb to each fingertip one by one. Extend fingers fully after they are touched.      4) Finger Taps Start with the hand flat and fingers slightly spread.  One at a time, starting with the thumb, lift each finger up separately.    5) PIP Joint Blocking Grasp the affected finger,  bracing below the middle knuckle, and actively bend the finger as shown.    6) DIP Joint Blocking Grasp the affected finger, bracing below the last knuckle, and actively bend the finger at the last joint.     7) Digit Abduction/Adduction Hold hand palm down flat on table. Spread your fingers apart and back together.

## 2023-01-09 NOTE — Patient Instructions (Signed)
Diabetes Mellitus and Nutrition, Adult When you have diabetes, or diabetes mellitus, it is very important to have healthy eating habits because your blood sugar (glucose) levels are greatly affected by what you eat and drink. Eating healthy foods in the right amounts, at about the same times every day, can help you: Manage your blood glucose. Lower your risk of heart disease. Improve your blood pressure. Reach or maintain a healthy weight. What can affect my meal plan? Every person with diabetes is different, and each person has different needs for a meal plan. Your health care provider may recommend that you work with a dietitian to make a meal plan that is best for you. Your meal plan may vary depending on factors such as: The calories you need. The medicines you take. Your weight. Your blood glucose, blood pressure, and cholesterol levels. Your activity level. Other health conditions you have, such as heart or kidney disease. How do carbohydrates affect me? Carbohydrates, also called carbs, affect your blood glucose level more than any other type of food. Eating carbs raises the amount of glucose in your blood. It is important to know how many carbs you can safely have in each meal. This is different for every person. Your dietitian can help you calculate how many carbs you should have at each meal and for each snack. How does alcohol affect me? Alcohol can cause a decrease in blood glucose (hypoglycemia), especially if you use insulin or take certain diabetes medicines by mouth. Hypoglycemia can be a life-threatening condition. Symptoms of hypoglycemia, such as sleepiness, dizziness, and confusion, are similar to symptoms of having too much alcohol. Do not drink alcohol if: Your health care provider tells you not to drink. You are pregnant, may be pregnant, or are planning to become pregnant. If you drink alcohol: Limit how much you have to: 0-1 drink a day for women. 0-2 drinks a day  for men. Know how much alcohol is in your drink. In the [place] one drink equals one 12 oz bottle of beer (355 mL), one 5 oz glass of wine (148 mL), or one 1 oz glass of hard liquor (44 mL). Keep yourself hydrated with water, diet soda, or unsweetened iced tea. Keep in mind that regular soda, juice, and other mixers may contain a lot of sugar and must be counted as carbs. What are tips for following this plan?  Reading food labels Start by checking the serving size on the Nutrition Facts label of packaged foods and drinks. The number of calories and the amount of carbs, fats, and other nutrients listed on the label are based on one serving of the item. Many items contain more than one serving per package. Check the total grams (g) of carbs in one serving. Check the number of grams of saturated fats and trans fats in one serving. Choose foods that have a low amount or none of these fats. Check the number of milligrams (mg) of salt (sodium) in one serving. Most people should limit total sodium intake to less than 2,300 mg per day. Always check the nutrition information of foods labeled as "low-fat" or "nonfat." These foods may be higher in added sugar or refined carbs and should be avoided. Talk to your dietitian to identify your daily goals for nutrients listed on the label. Shopping Avoid buying canned, pre-made, or processed foods. These foods tend to be high in fat, sodium, and added sugar. Shop around the outside edge of the grocery store. This is where you  will most often find fresh fruits and vegetables, bulk grains, fresh meats, and fresh dairy products. Cooking Use low-heat cooking methods, such as baking, instead of high-heat cooking methods, such as deep frying. Cook using healthy oils, such as olive, canola, or sunflower oil. Avoid cooking with butter, cream, or high-fat meats. Meal planning Eat meals and snacks regularly, preferably at the same times every day. Avoid going long periods  of time without eating. Eat foods that are high in fiber, such as fresh fruits, vegetables, beans, and whole grains. Eat 4-6 oz (112-168 g) of lean protein each day, such as lean meat, chicken, fish, eggs, or tofu. One ounce (oz) (28 g) of lean protein is equal to: 1 oz (28 g) of meat, chicken, or fish. 1 egg.  cup (62 g) of tofu. Eat some foods each day that contain healthy fats, such as avocado, nuts, seeds, and fish. What foods should I eat? Fruits Berries. Apples. Oranges. Peaches. Apricots. Plums. Grapes. Mangoes. Papayas. Pomegranates. Kiwi. Cherries. Vegetables Leafy greens, including lettuce, spinach, kale, chard, collard greens, mustard greens, and cabbage. Beets. Cauliflower. Broccoli. Carrots. Green beans. Tomatoes. Peppers. Onions. Cucumbers. Brussels sprouts. Grains Whole grains, such as whole-wheat or whole-grain bread, crackers, tortillas, cereal, and pasta. Unsweetened oatmeal. Quinoa. Brown or wild rice. Meats and other proteins Seafood. Poultry without skin. Lean cuts of poultry and beef. Tofu. Nuts. Seeds. Dairy Low-fat or fat-free dairy products such as milk, yogurt, and cheese. The items listed above may not be a complete list of foods and beverages you can eat and drink. Contact a dietitian for more information. What foods should I avoid? Fruits Fruits canned with syrup. Vegetables Canned vegetables. Frozen vegetables with butter or cream sauce. Grains Refined white flour and flour products such as bread, pasta, snack foods, and cereals. Avoid all processed foods. Meats and other proteins Fatty cuts of meat. Poultry with skin. Breaded or fried meats. Processed meat. Avoid saturated fats. Dairy Full-fat yogurt, cheese, or milk. Beverages Sweetened drinks, such as soda or iced tea. The items listed above may not be a complete list of foods and beverages you should avoid. Contact a dietitian for more information. Questions to ask a health care provider Do I need  to meet with a certified diabetes care and education specialist? Do I need to meet with a dietitian? What number can I call if I have questions? When are the best times to check my blood glucose? Where to find more information: American Diabetes Association: diabetes.org Academy of Nutrition and Dietetics: eatright.[employer] of Diabetes and Digestive and Kidney Diseases: StageSync.si Association of Diabetes Care & Education Specialists: diabeteseducator.org Summary It is important to have healthy eating habits because your blood sugar (glucose) levels are greatly affected by what you eat and drink. It is important to use alcohol carefully. A healthy meal plan will help you manage your blood glucose and lower your risk of heart disease. Your health care provider may recommend that you work with a dietitian to make a meal plan that is best for you. This information is not intended to replace advice given to you by your health care provider. Make sure you discuss any questions you have with your health care provider. Document Revised: 10/02/2019 Document Reviewed: 10/02/2019 Elsevier Patient Education  2024 Elsevier Inc.        - The correct intake of thyroid hormone (Levothyroxine, Synthroid), is on empty stomach first thing in the morning, with water, separated by at least 30 minutes from breakfast and  other medications,  and separated by more than 4 hours from calcium, iron, multivitamins, acid reflux medications (PPIs).  - This medication is a life-long medication and will be needed to correct thyroid hormone imbalances for the rest of your life.  The dose may change from time to time, based on thyroid blood work.  - It is extremely important to be consistent taking this medication, near the same time each morning.  -AVOID TAKING PRODUCTS CONTAINING BIOTIN (commonly found in Hair, Skin, Nails vitamins) AS IT INTERFERES WITH THE VALIDITY OF THYROID FUNCTION BLOOD TESTS.

## 2023-01-10 ENCOUNTER — Encounter: Payer: Self-pay | Admitting: Nurse Practitioner

## 2023-01-10 ENCOUNTER — Ambulatory Visit: Payer: Medicaid Other | Admitting: Nurse Practitioner

## 2023-01-10 VITALS — BP 118/77 | HR 92 | Ht 64.0 in | Wt 104.0 lb

## 2023-01-10 DIAGNOSIS — Z7984 Long term (current) use of oral hypoglycemic drugs: Secondary | ICD-10-CM | POA: Diagnosis not present

## 2023-01-10 DIAGNOSIS — E782 Mixed hyperlipidemia: Secondary | ICD-10-CM | POA: Diagnosis not present

## 2023-01-10 DIAGNOSIS — Z7985 Long-term (current) use of injectable non-insulin antidiabetic drugs: Secondary | ICD-10-CM | POA: Diagnosis not present

## 2023-01-10 DIAGNOSIS — E039 Hypothyroidism, unspecified: Secondary | ICD-10-CM | POA: Diagnosis not present

## 2023-01-10 DIAGNOSIS — E119 Type 2 diabetes mellitus without complications: Secondary | ICD-10-CM | POA: Diagnosis not present

## 2023-01-10 LAB — POCT GLYCOSYLATED HEMOGLOBIN (HGB A1C): Hemoglobin A1C: 5.2 % (ref 4.0–5.6)

## 2023-01-10 MED ORDER — TRULICITY 1.5 MG/0.5ML ~~LOC~~ SOAJ: 1.5000 mg | SUBCUTANEOUS | 3 refills | Status: DC

## 2023-01-10 MED ORDER — LEVOTHYROXINE SODIUM 50 MCG PO TABS: 50.0000 ug | ORAL_TABLET | Freq: Every day | ORAL | 1 refills | Status: DC

## 2023-01-10 NOTE — Progress Notes (Unsigned)
Endocrinology Consult Note       01/11/2023, 7:41 AM   Subjective:    Patient ID: Alexandria Webster, female    DOB: [birth year].  Alexandria Webster is being seen in consultation for management of currently uncontrolled symptomatic diabetes requested by  Assunta Found, MD.   Past Medical History:  Diagnosis Date   Asthma    COPD (chronic obstructive pulmonary disease) (HCC)    DDD (degenerative disc disease), lumbar    Depression    Diabetes mellitus    Gastroparesis    GERD without esophagitis    High cholesterol    Hypertension    Hypothyroidism    PONV (postoperative nausea and vomiting)    PVD (peripheral vascular disease) (HCC)    Wears glasses     Past Surgical History:  Procedure Laterality Date   AIR/FLUID EXCHANGE Right 11/22/2022   Procedure: AIR/FLUID EXCHANGE;  Surgeon: Carmela Rima, MD;  Location: [facility] OR;  Service: Ophthalmology;  Laterality: Right;   AORTA - BILATERAL FEMORAL ARTERY BYPASS GRAFT N/A 09/27/2018   Procedure: Redo Exposure  AORTOBIFEMORAL Bypass and bilateral femoral Artery ; Redo Aortobifemoral  BYPASS GRAFT.;  Surgeon: Cephus Shelling, MD;  Location: [facility] OR;  Service: Vascular;  Laterality: N/A;   BACK SURGERY     BIOPSY  10/14/2021   Procedure: BIOPSY;  Surgeon: Lanelle Bal, DO;  Location: [facility];  Service: Endoscopy;;   COLONOSCOPY WITH PROPOFOL N/A 10/14/2021   Procedure: COLONOSCOPY WITH PROPOFOL;  Surgeon: Lanelle Bal, DO;  Location: [facility];  Service: Endoscopy;  Laterality: N/A;  8:30am   ESOPHAGOGASTRODUODENOSCOPY (EGD) WITH PROPOFOL N/A 10/14/2021   Procedure: ESOPHAGOGASTRODUODENOSCOPY (EGD) WITH PROPOFOL;  Surgeon: Lanelle Bal, DO;  Location: [facility];  Service: Endoscopy;  Laterality: N/A;   FEMORAL ARTERY STENT  right leg   INJECTION OF SILICONE OIL Left 09/06/2022   Procedure: INJECTION OF SILICONE OIL;  Surgeon: Carmela Rima, MD;  Location: [facility] OR;  Service: Ophthalmology;  Laterality: Left;   INJECTION OF SILICONE OIL Right 11/22/2022   Procedure: INJECTION OF SILICONE OIL;  Surgeon: Carmela Rima, MD;  Location: [facility] OR;  Service: Ophthalmology;  Laterality: Right;   MEMBRANE PEEL Left 09/06/2022   Procedure: MEMBRANECTOMY;  Surgeon: Carmela Rima, MD;  Location: [facility] OR;  Service: Ophthalmology;  Laterality: Left;   PARS PLANA VITRECTOMY Left 09/06/2022   Procedure: PARS PLANA VITRECTOMY WITH 25 GAUGE;  Surgeon: Carmela Rima, MD;  Location: [facility] OR;  Service: Ophthalmology;  Laterality: Left;   PHOTOCOAGULATION WITH LASER Left 09/06/2022   Procedure: PHOTOCOAGULATION WITH LASER;  Surgeon: Carmela Rima, MD;  Location: [facility] OR;  Service: Ophthalmology;  Laterality: Left;   PHOTOCOAGULATION WITH LASER Right 11/22/2022   Procedure: PHOTOCOAGULATION WITH LASER;  Surgeon: Carmela Rima, MD;  Location: [facility] OR;  Service: Ophthalmology;  Laterality: Right;   POSTERIOR CERVICAL FUSION/FORAMINOTOMY N/A 07/19/2022   Procedure: Posterior cervical fusion with lateral mass fixation - Cervical four-cervical five, Cervical five-Cervical six - Cervical six-Cervical seven - Cervical seven-Thoracic one with laminectomy;  Surgeon: Julio Sicks, MD;  Location: [facility] OR;  Service: Neurosurgery;  Laterality: N/A;   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  Procedure: REPAIR OF COMPLEX TRACTION RETINAL DETACHMENT;  Surgeon: Carmela Rima, MD;  Location: [facility] OR;  Service: Ophthalmology;  Laterality: Left;  MAC WITH BLOCK   REPAIR OF COMPLEX TRACTION RETINAL DETACHMENT Right 11/22/2022   Procedure: REPAIR OF COMPLEX TRACTION RETINAL DETACHMENT;  Surgeon: Carmela Rima, MD;  Location: [facility] OR;  Service: Ophthalmology;  Laterality: Right;  MAC WITH BLOCK   TUBAL LIGATION      Social History   Socioeconomic History   Marital status: Single    Spouse name: Not on file   Number of children: 2   Years of education: Not  on file   Highest education level: Not on file  Occupational History   Not on file  Tobacco Use   Smoking status: Former    Current packs/day: 0.00    Types: Cigarettes    Quit date: 03/14/1982    Years since quitting: 40.8    Passive exposure: Past   Smokeless tobacco: Current    Types: Snuff  Vaping Use   Vaping status: Never Used  Substance and Sexual Activity   Alcohol use: No   Drug use: No   Sexual activity: Yes    Birth control/protection: Surgical    Comment: tubal  Other Topics Concern   Not on file  Social History Narrative   Not on file   Social Determinants of Health   Financial Resource Strain: Not on file  Food Insecurity: Not on file  Transportation Needs: Not on file  Physical Activity: Not on file  Stress: Not on file  Social Connections: Not on file    Family History  Problem Relation Age of Onset   Stroke Mother    Hypertension Mother    Heart failure Father    Asthma Father    Diabetes Father    Heart disease Father    Emphysema Paternal Grandfather    Colon cancer Neg Hx    Colon polyps Neg Hx     Outpatient Encounter Medications as of 01/10/2023  Medication Sig   acetaminophen (TYLENOL) 325 MG tablet Take 2 tablets (650 mg total) by mouth every 4 (four) hours as needed for mild pain ((score 1 to 3) or temp > 100.5). (Patient taking differently: Take 1,000 mg by mouth every morning.)   albuterol (PROVENTIL) 2 MG tablet Take 1 tablet (2 mg total) by mouth daily.   albuterol (VENTOLIN HFA) 108 (90 Base) MCG/ACT inhaler Inhale 1-2 puffs into the lungs every 4 (four) hours as needed for wheezing or shortness of breath.   aspirin 325 MG tablet Take 325 mg by mouth in the morning.   cetirizine (ZYRTEC) 10 MG tablet Take 10 mg by mouth at bedtime.   cyclobenzaprine (FLEXERIL) 5 MG tablet Take 5 mg by mouth at bedtime.   docusate sodium (COLACE) 100 MG capsule Take 200 mg by mouth every morning.   esomeprazole (NEXIUM) 40 MG capsule Take 1 capsule  (40 mg total) by mouth 2 (two) times daily before a meal.   HYDROcodone-acetaminophen (NORCO/VICODIN) 5-325 MG tablet Take 1 tablet by mouth every 6 (six) hours as needed for moderate pain. Trying to wean dose- max 4x/day   linaclotide (LINZESS) 290 MCG CAPS capsule Take 1 capsule (290 mcg total) by mouth daily before breakfast.   lovastatin (MEVACOR) 20 MG tablet Take 1 tablet (20 mg total) by mouth at bedtime. (Patient taking differently: Take 20 mg by mouth in the morning.)   meclizine (ANTIVERT) 25 MG tablet Take 1 tablet (25 mg total)  by mouth 3 (three) times daily as needed for dizziness. (Patient taking differently: Take 25 mg by mouth See admin instructions. Take 1 tablet (25 mg) by mouth scheduled every morning, and may repeat dose (25 mg) by mouth up to twice daily if needed for dizziness/vertigo.)   midodrine (PROAMATINE) 10 MG tablet Take 1 tablet (10 mg total) by mouth 3 (three) times daily. For orthostatic hypotension- due to cervical myelopathy   oxymetazoline (VICKS SINEX 12 HOUR DECONGEST) 0.05 % nasal spray Place 1 spray into both nostrils at bedtime.   potassium chloride (KLOR-CON) 10 MEQ tablet Take 10 mEq by mouth every morning.   traZODone (DESYREL) 150 MG tablet Take 1 tablet (150 mg total) by mouth at bedtime.   [DISCONTINUED] Dulaglutide (TRULICITY) 1.5 MG/0.5ML SOPN Inject 1.5 mg into the skin once a week. (Patient taking differently: Inject 1.5 mg into the skin every Saturday.)   [DISCONTINUED] glimepiride (AMARYL) 2 MG tablet Take 1 tablet (2 mg total) by mouth daily with breakfast.   [DISCONTINUED] levothyroxine (SYNTHROID) 50 MCG tablet Take 1 tablet (50 mcg total) by mouth daily before breakfast.   Dulaglutide (TRULICITY) 1.5 MG/0.5ML SOAJ Inject 1.5 mg into the skin once a week.   levothyroxine (SYNTHROID) 50 MCG tablet Take 1 tablet (50 mcg total) by mouth daily before breakfast.   No facility-administered encounter medications on file as of 01/10/2023.     ALLERGIES: Allergies  Allergen Reactions   Metformin And Related Nausea And Vomiting    VACCINATION STATUS:  There is no immunization history on file for this patient.  Diabetes She presents for her initial diabetic visit. She has type 2 diabetes mellitus. Onset time: diagnosed at approx age of 49. Her disease course has been improving. Hypoglycemia symptoms include nervousness/anxiousness, sweats and tremors. Associated symptoms include blurred vision and fatigue. There are no hypoglycemic complications. Diabetic complications include heart disease, nephropathy, PVD and retinopathy. (gastroparesis) Risk factors for coronary artery disease include diabetes mellitus, dyslipidemia and family history. Current diabetic treatment includes oral agent (monotherapy) (and Trulicity). She is compliant with treatment most of the time. Her weight is decreasing steadily. She is following a generally unhealthy diet. She has not had a previous visit with a dietitian. She rarely participates in exercise. (She presents today, accompanied by her daughter, with no meter or logs to review.  Her POCT A1c today is 5.2%, improving drastically from last A1c of 7.3% in June.  She does check glucose at home twice daily most days.  She drinks Diet soda and water with SF flavorings.  She eats 2 meals per day, most days skipping supper as she is not hungry.  She does occasionally eat snacks.  She does participate in PT, is mostly WC bound due to spinal issues.  She is UTD on eye exam, has not seen podiatry in the past.) An ACE inhibitor/angiotensin II receptor blocker is contraindicated. She does not see a podiatrist.Eye exam is current.     Review of systems  Constitutional: + decreasing body weight, current Body mass index is 17.85 kg/m., no fatigue, no subjective hyperthermia, no subjective hypothermia Eyes: + blurry vision, no xerophthalmia, wearing protective patch over right eye (recent retinal detachment) ENT:  no sore throat, no nodules palpated in throat, no dysphagia/odynophagia, no hoarseness Cardiovascular: no chest pain, no shortness of breath, no palpitations, no leg swelling Respiratory: no cough, no shortness of breath Gastrointestinal: no nausea/vomiting/diarrhea, + gastroparesis Musculoskeletal: no muscle/joint aches, essentially WC bound (working with PT to regain strength) Skin: no  rashes, no hyperemia Neurological: no tremors, no numbness, no tingling, no dizziness Psychiatric: no depression, no anxiety  Objective:     BP 118/77 (BP Location: Left Arm, Patient Position: Sitting, Cuff Size: Large)   Pulse 92   Ht 5\' 4"  (1.626 m)   Wt 104 lb (47.2 kg) Comment: per patient, she is in wheelchair  LMP 01/21/2012   BMI 17.85 kg/m   Wt Readings from Last 3 Encounters:  01/10/23 104 lb (47.2 kg)  12/12/22 104 lb (47.2 kg)  11/22/22 122 lb (55.3 kg)     BP Readings from Last 3 Encounters:  01/10/23 118/77  12/26/22 139/81  12/12/22 119/72     Physical Exam- Limited  Constitutional:  Body mass index is 17.85 kg/m. , not in acute distress, normal state of mind Eyes:  EOMI, no exophthalmos, wearing protective patch over right eye from surgery to correct recent retinal detachment Neck: Supple Thyroid: No gross goiter Cardiovascular: RRR, no murmurs, rubs, or gallops, no edema Respiratory: Adequate breathing efforts, no crackles, rales, + rhonchi to BLL, no wheezing Musculoskeletal: no gross deformities, strength intact in all four extremities, no gross restriction of joint movements, essentially WC bound due to mobility deficits from recent spinal issues (wearing soft collar) Skin:  no rashes, no hyperemia, female patterned hair loss Neurological: no tremor with outstretched hands  Diabetic Foot Exam - Simple   No data filed      CMP ( most recent) CMP     Component Value Date/Time   NA 136 11/22/2022 1315   K 4.0 11/22/2022 1315   CL 104 11/22/2022 1315   CO2 21 (L)  11/22/2022 1315   GLUCOSE 98 11/22/2022 1315   BUN 20 11/22/2022 1315   CREATININE 0.87 11/22/2022 1315   CALCIUM 9.4 11/22/2022 1315   PROT 5.3 (L) 07/25/2022 0611   ALBUMIN 2.2 (L) 07/25/2022 0611   AST 11 (L) 07/25/2022 0611   ALT 8 07/25/2022 0611   ALKPHOS 52 07/25/2022 0611   BILITOT 0.6 07/25/2022 0611   GFRNONAA >60 11/22/2022 1315     Diabetic Labs (most recent): Lab Results  Component Value Date   HGBA1C 5.2 01/10/2023   HGBA1C 7.3 08/16/2022   HGBA1C 9.3 (H) 07/14/2022     Lipid Panel ( most recent) Lipid Panel     Component Value Date/Time   CHOL 146 08/07/2021 0613   TRIG 120 08/07/2021 0613   HDL 44 08/07/2021 0613   CHOLHDL 3.3 08/07/2021 0613   VLDL 24 08/07/2021 0613   LDLCALC 78 08/07/2021 0613      Lab Results  Component Value Date   TSH 1.411 06/20/2022   TSH 1.159 08/06/2021           Assessment & Plan:   1) Type 2 diabetes mellitus without complication, without long-term current use of insulin (HCC)  She presents today, accompanied by her daughter, with no meter or logs to review.  Her POCT A1c today is 5.2%, improving drastically from last A1c of 7.3% in June.  She does check glucose at home twice daily most days.  She drinks Diet soda and water with SF flavorings.  She eats 2 meals per day, most days skipping supper as she is not hungry.  She does occasionally eat snacks.  She does participate in PT, is mostly WC bound due to spinal issues.  She is UTD on eye exam, has not seen podiatry in the past.  - Alexandria Webster has currently uncontrolled symptomatic type 2  DM since 57 years of age, with most recent A1c of 5.2 %.   -Recent labs reviewed.  - I had a long discussion with her about the progressive nature of diabetes and the pathology behind its complications. -her diabetes is complicated by CKD, gastroparesis, PVD, CAD and she remains at a high risk for more acute and chronic complications which include CAD, CVA, CKD, retinopathy,  and neuropathy. These are all discussed in detail with her.  The following Lifestyle Medicine recommendations according to American College of Lifestyle Medicine [facility]) were discussed and offered to patient and she agrees to start the journey:  A. Whole Foods, Plant-based plate comprising of fruits and vegetables, plant-based proteins, whole-grain carbohydrates was discussed in detail with the patient.   A list for source of those nutrients were also provided to the patient.  Patient will use only water or unsweetened tea for hydration. B.  The need to stay away from risky substances including alcohol, smoking; obtaining 7 to 9 hours of restorative sleep, at least 150 minutes of moderate intensity exercise weekly, the importance of healthy social connections,  and stress reduction techniques were discussed. C.  A full color page of  Calorie density of various food groups per pound showing examples of each food groups was provided to the patient.  - I have counseled her on diet and weight management by adopting a carbohydrate restricted/protein rich diet. Patient is encouraged to switch to unprocessed or minimally processed complex starch and increased protein intake (animal or plant source), fruits, and vegetables. -  she is advised to stick to a routine mealtimes to eat 3 meals a day and avoid unnecessary snacks (to snack only to correct hypoglycemia).   - she acknowledges that there is a room for improvement in her food and drink choices. - Suggestion is made for her to avoid simple carbohydrates from her diet including Cakes, Sweet Desserts, Ice Cream, Soda (diet and regular), Sweet Tea, Candies, Chips, Cookies, Store Bought Juices, Alcohol in Excess of 1-2 drinks a day, Artificial Sweeteners, Coffee Creamer, and "Sugar-free" Products. This will help patient to have more stable blood glucose profile and potentially avoid unintended weight gain.  - I have approached her with the following  individualized plan to manage her diabetes and patient agrees:    -she is encouraged to start monitoring glucose 1 time a day, before breakfast, to log their readings on the clinic sheets provided, and bring them to review at follow up appointment in 3 months.  - Adjustment parameters are given to her for hypo and hyperglycemia in writing. - she is encouraged to call clinic for blood glucose levels less than 70 or above 300 mg /dl. - she is advised to continue Trulicity 1.5 mg SQ weekly.  She is not ideal candidate for incretin therapy with BMI less than 25 and hx of gastroparesis but she has been tolerating it well.  She has been on Trulicity 1.5 mg SQ weekly for some time now.  Will allow her to stay on this for now. - her Glimepiride will be discontinued, risk outweighs benefit for this patient (risk of hypoglycemia).  - she is not a candidate for Metformin due to concurrent renal insufficiency.  - Specific targets for  A1c; LDL, HDL, and Triglycerides were discussed with the patient.  2) Blood Pressure /Hypertension:  her blood pressure is controlled to target, in fact she is on Midodrine for orthostatic hypotension.   3) Lipids/Hyperlipidemia:    There is no recent lipid  panel available to review.  she is advised to continue Lovastatin 20 mg daily at bedtime.  Side effects and precautions discussed with her.  4)  Weight/Diet:  her Body mass index is 17.85 kg/m.  -   she is NOT a candidate for weight loss.  Exercise, and detailed carbohydrates information provided  -  detailed on discharge instructions.  5) Chronic Care/Health Maintenance: -she is not on ACEI/ARB and is on Statin medications and is encouraged to initiate and continue to follow up with Ophthalmology, Dentist, Podiatrist at least yearly or according to recommendations, and advised to stay away from smoking. I have recommended yearly flu vaccine and pneumonia vaccine at least every 5 years; moderate intensity exercise for  up to 150 minutes weekly; and sleep for at least 7 hours a day.  6) Hypothyroidism-unspecified She was diagnosed with hypothyroidism at approximate age of 41.  She is currently on Levothyroxine 50 mcg po daily before breakfast.  She notes she has been taking it with her heart pill in the morning.  She does not take any Biotin supplement.  She does not have family history of thyroid dysfunction, including thyroid cancer.  She has never had any imaging of her thyroid in the past (that she can recall), nor has she been exposed to radiation of her head or neck in the past.  She is advised to continue Levothyroxine 50 mcg po daily before breakfast.  We did go over proper administration of this.  - The correct intake of thyroid hormone (Levothyroxine, Synthroid), is on empty stomach first thing in the morning, with water, separated by at least 30 minutes from breakfast and other medications,  and separated by more than 4 hours from calcium, iron, multivitamins, acid reflux medications (PPIs).  - This medication is a life-long medication and will be needed to correct thyroid hormone imbalances for the rest of your life.  The dose may change from time to time, based on thyroid blood work.  - It is extremely important to be consistent taking this medication, near the same time each morning.  -AVOID TAKING PRODUCTS CONTAINING BIOTIN (commonly found in Hair, Skin, Nails vitamins) AS IT INTERFERES WITH THE VALIDITY OF THYROID FUNCTION BLOOD TESTS.  She will not need any imaging of her thyroid at this time due to normal physical exam.  - she is advised to maintain close follow up with Assunta Found, MD for primary care needs, as well as her other providers for optimal and coordinated care.   - Time spent in this patient care: 60 min, of which > 50% was spent in counseling her about her diabetes and the rest reviewing her blood glucose logs, discussing her hypoglycemia and hyperglycemia episodes, reviewing her  current and previous labs/studies (including abstraction from other facilities) and medications doses and developing a long term treatment plan based on the latest standards of care/guidelines; and documenting her care.    Please refer to Patient Instructions for Blood Glucose Monitoring and Insulin/Medications Dosing Guide" in media tab for additional information. Please also refer to "Patient Self Inventory" in the Media tab for reviewed elements of pertinent patient history.  Alexandria Webster participated in the discussions, expressed understanding, and voiced agreement with the above plans.  All questions were answered to her satisfaction. she is encouraged to contact clinic should she have any questions or concerns prior to her return visit.     Follow up plan: - Return in about 3 months (around 04/12/2023) for Diabetes F/U with A1c  in office, Thyroid follow up, Previsit labs, Bring meter and logs.    Ronny Bacon, [facility] [address] [place], [place] [address] Phone: [phone] Fax: [phone]  01/11/2023, 7:41 AM

## 2023-01-11 ENCOUNTER — Encounter (HOSPITAL_COMMUNITY): Payer: 59

## 2023-01-17 ENCOUNTER — Encounter (HOSPITAL_COMMUNITY): Payer: 59

## 2023-01-18 ENCOUNTER — Encounter (HOSPITAL_COMMUNITY): Payer: 59

## 2023-01-18 ENCOUNTER — Encounter (HOSPITAL_COMMUNITY): Payer: 59 | Admitting: Occupational Therapy

## 2023-01-20 DIAGNOSIS — E113511 Type 2 diabetes mellitus with proliferative diabetic retinopathy with macular edema, right eye: Secondary | ICD-10-CM | POA: Diagnosis not present

## 2023-01-20 DIAGNOSIS — N182 Chronic kidney disease, stage 2 (mild): Secondary | ICD-10-CM | POA: Diagnosis not present

## 2023-01-20 DIAGNOSIS — E876 Hypokalemia: Secondary | ICD-10-CM | POA: Diagnosis not present

## 2023-01-20 DIAGNOSIS — H3582 Retinal ischemia: Secondary | ICD-10-CM | POA: Diagnosis not present

## 2023-01-20 DIAGNOSIS — E559 Vitamin D deficiency, unspecified: Secondary | ICD-10-CM | POA: Diagnosis not present

## 2023-01-20 DIAGNOSIS — N189 Chronic kidney disease, unspecified: Secondary | ICD-10-CM | POA: Diagnosis not present

## 2023-01-20 DIAGNOSIS — E113513 Type 2 diabetes mellitus with proliferative diabetic retinopathy with macular edema, bilateral: Secondary | ICD-10-CM | POA: Diagnosis not present

## 2023-01-20 DIAGNOSIS — H35372 Puckering of macula, left eye: Secondary | ICD-10-CM | POA: Diagnosis not present

## 2023-01-20 DIAGNOSIS — E1122 Type 2 diabetes mellitus with diabetic chronic kidney disease: Secondary | ICD-10-CM | POA: Diagnosis not present

## 2023-01-20 DIAGNOSIS — N1831 Chronic kidney disease, stage 3a: Secondary | ICD-10-CM | POA: Diagnosis not present

## 2023-01-20 DIAGNOSIS — I129 Hypertensive chronic kidney disease with stage 1 through stage 4 chronic kidney disease, or unspecified chronic kidney disease: Secondary | ICD-10-CM | POA: Diagnosis not present

## 2023-01-20 DIAGNOSIS — E1129 Type 2 diabetes mellitus with other diabetic kidney complication: Secondary | ICD-10-CM | POA: Diagnosis not present

## 2023-01-20 DIAGNOSIS — R809 Proteinuria, unspecified: Secondary | ICD-10-CM | POA: Diagnosis not present

## 2023-01-24 ENCOUNTER — Encounter (HOSPITAL_COMMUNITY): Payer: 59

## 2023-01-24 ENCOUNTER — Encounter (HOSPITAL_COMMUNITY): Payer: 59 | Admitting: Occupational Therapy

## 2023-01-31 ENCOUNTER — Encounter (HOSPITAL_COMMUNITY): Payer: 59

## 2023-01-31 ENCOUNTER — Encounter (HOSPITAL_COMMUNITY): Payer: 59 | Admitting: Occupational Therapy

## 2023-02-01 NOTE — Progress Notes (Deleted)
GI Office Note    Referring Provider: Assunta Found, MD Primary Care Physician:  Assunta Found, MD Primary Gastroenterologist: Hennie Duos. Marletta Lor, DO  Date:  02/01/2023  ID:  Alexandria Webster, DOB 1965/03/17, MRN [id number]   Chief Complaint   No chief complaint on file.   History of Present Illness  Alexandria Webster is a 57 y.o. female with a history of constipation, asthma, COPD, diabetes, gastroparesis, GERD, HTN, hypothyroidism, and PVD presenting today with complaint of ***  EGD 10/14/21: - small hiatal hernia - normal esophagus - gastritis s/p biopsy (normal mucosa) - normal duodenum - Advised PPI daily.    Colonoscopy 10/14/21: - Normal - Repeat in 10 years   OV 01/20/2022.  Improved GERD with Nexium twice daily.  Nausea vomiting significantly improved since starting Nexium twice daily.  Did report dairy does bother her.  Still having appetite and weight loss but this is likely secondary to Trulicity.  Constipation fairly well-controlled with Colace daily and MiraLAX as needed.  Denied any straining.  Bowel movement at least every day if not twice a day.  Nexium prescription updated, encouraged high-protein diet and protein supplementation and to monitor weight.  Follow-up in 6 months.   OV 02/09/22.  2 episodes of vomiting occurring in the evening.  Happens randomly without nausea.  Always occurring after she eats.  It admits to eating fried/greasy foods rarely.  Noted to be on Trulicity.  Have reported 9-10 pound weight loss since prior visit.  Taking Colace daily and if she takes MiraLAX she does all bit more and was going on Rehman for the most part.  Denies any abdominal pain, melena, or BRBPR.  Advise continue Nexium twice daily.  Continue MiraLAX and Colace.  Advised protein supplements and avoid lactose.   OV 10/10/22. Taking MiraLAX and Colace once daily.  Occasionally having take mineral oil to have a bowel movement.  She thinks MiraLAX complex her stools does not feel he  is working well.  States sometimes she goes 2-3 weeks without a bowel movement, with mineral oil goes 2-3 days/week.  Still straining at times.  Mild toilet tissue hematochezia.  Mild nausea/vomiting with constipation, no overt abdominal pain.  GERD well-controlled if she takes Nexium twice daily, recently having issues with medication getting refilled.  Mild pill dysphagia but not frequently.  Decreased appetite likely secondary to Trulicity. Nexium BID, stool softener 1-2 times daily. Increase water intake. Trial Linzess 145 mcg daily. High protein diet.  Advised to trial Linzess 145 mcg daily, continue Nexium twice daily, continue stool softener daily and possibly consider increasing to twice daily.   On 9/26 patient reported 145 was not working for her as she had not had a bowel something additional.  Recommended for her to increase her Linzess to 290 mcg daily and start MiraLAX daily.  Last office visit 12/12/22.***. Advised to increase calories by 500 a day to help with weight gain.  Advised Linzess 290 daily and MiraLAX once daily.  May use Dulcolax suppositories.  Continue Nexium 40 mg twice daily and increase water and fiber intake.  Advised potential to try Movantik if Linzess and MiraLAX not helpful.   Today:    Wt Readings from Last 3 Encounters:  01/10/23 104 lb (47.2 kg)  12/12/22 104 lb (47.2 kg)  11/22/22 122 lb (55.3 kg)    Current Outpatient Medications  Medication Sig Dispense Refill   acetaminophen (TYLENOL) 325 MG tablet Take 2 tablets (650 mg total) by mouth every 4 (  four) hours as needed for mild pain ((score 1 to 3) or temp > 100.5). (Patient taking differently: Take 1,000 mg by mouth every morning.)     albuterol (PROVENTIL) 2 MG tablet Take 1 tablet (2 mg total) by mouth daily. 30 tablet 0   albuterol (VENTOLIN HFA) 108 (90 Base) MCG/ACT inhaler Inhale 1-2 puffs into the lungs every 4 (four) hours as needed for wheezing or shortness of breath. 8 g 0   aspirin 325 MG  tablet Take 325 mg by mouth in the morning.     cetirizine (ZYRTEC) 10 MG tablet Take 10 mg by mouth at bedtime.     cyclobenzaprine (FLEXERIL) 5 MG tablet Take 5 mg by mouth at bedtime.     docusate sodium (COLACE) 100 MG capsule Take 200 mg by mouth every morning.     Dulaglutide (TRULICITY) 1.5 MG/0.5ML SOAJ Inject 1.5 mg into the skin once a week. 3 mL 3   esomeprazole (NEXIUM) 40 MG capsule Take 1 capsule (40 mg total) by mouth 2 (two) times daily before a meal. 60 capsule 5   HYDROcodone-acetaminophen (NORCO/VICODIN) 5-325 MG tablet Take 1 tablet by mouth every 6 (six) hours as needed for moderate pain. Trying to wean dose- max 4x/day 120 tablet 0   levothyroxine (SYNTHROID) 50 MCG tablet Take 1 tablet (50 mcg total) by mouth daily before breakfast. 90 tablet 1   linaclotide (LINZESS) 290 MCG CAPS capsule Take 1 capsule (290 mcg total) by mouth daily before breakfast. 30 capsule 2   lovastatin (MEVACOR) 20 MG tablet Take 1 tablet (20 mg total) by mouth at bedtime. (Patient taking differently: Take 20 mg by mouth in the morning.) 30 tablet 0   meclizine (ANTIVERT) 25 MG tablet Take 1 tablet (25 mg total) by mouth 3 (three) times daily as needed for dizziness. (Patient taking differently: Take 25 mg by mouth See admin instructions. Take 1 tablet (25 mg) by mouth scheduled every morning, and may repeat dose (25 mg) by mouth up to twice daily if needed for dizziness/vertigo.) 30 tablet 0   midodrine (PROAMATINE) 10 MG tablet Take 1 tablet (10 mg total) by mouth 3 (three) times daily. For orthostatic hypotension- due to cervical myelopathy 90 tablet 5   oxymetazoline (VICKS SINEX 12 HOUR DECONGEST) 0.05 % nasal spray Place 1 spray into both nostrils at bedtime.     potassium chloride (KLOR-CON) 10 MEQ tablet Take 10 mEq by mouth every morning.     traZODone (DESYREL) 150 MG tablet Take 1 tablet (150 mg total) by mouth at bedtime. 30 tablet 5   No current facility-administered medications for this  visit.    Past Medical History:  Diagnosis Date   Asthma    COPD (chronic obstructive pulmonary disease) (HCC)    DDD (degenerative disc disease), lumbar    Depression    Diabetes mellitus    Gastroparesis    GERD without esophagitis    High cholesterol    Hypertension    Hypothyroidism    PONV (postoperative nausea and vomiting)    PVD (peripheral vascular disease) (HCC)    Wears glasses     Past Surgical History:  Procedure Laterality Date   AIR/FLUID EXCHANGE Right 11/22/2022   Procedure: AIR/FLUID EXCHANGE;  Surgeon: Carmela Rima, MD;  Location: [facility] OR;  Service: Ophthalmology;  Laterality: Right;   AORTA - BILATERAL FEMORAL ARTERY BYPASS GRAFT N/A 09/27/2018   Procedure: Redo Exposure  AORTOBIFEMORAL Bypass and bilateral femoral Artery ; Redo Aortobifemoral  BYPASS GRAFT.;  Surgeon: Cephus Shelling, MD;  Location: [facility] OR;  Service: Vascular;  Laterality: N/A;   BACK SURGERY     BIOPSY  10/14/2021   Procedure: BIOPSY;  Surgeon: Lanelle Bal, DO;  Location: [facility];  Service: Endoscopy;;   COLONOSCOPY WITH PROPOFOL N/A 10/14/2021   Procedure: COLONOSCOPY WITH PROPOFOL;  Surgeon: Lanelle Bal, DO;  Location: [facility];  Service: Endoscopy;  Laterality: N/A;  8:30am   ESOPHAGOGASTRODUODENOSCOPY (EGD) WITH PROPOFOL N/A 10/14/2021   Procedure: ESOPHAGOGASTRODUODENOSCOPY (EGD) WITH PROPOFOL;  Surgeon: Lanelle Bal, DO;  Location: [facility];  Service: Endoscopy;  Laterality: N/A;   FEMORAL ARTERY STENT  right leg   INJECTION OF SILICONE OIL Left 09/06/2022   Procedure: INJECTION OF SILICONE OIL;  Surgeon: Carmela Rima, MD;  Location: [facility] OR;  Service: Ophthalmology;  Laterality: Left;   INJECTION OF SILICONE OIL Right 11/22/2022   Procedure: INJECTION OF SILICONE OIL;  Surgeon: Carmela Rima, MD;  Location: [facility] OR;  Service: Ophthalmology;  Laterality: Right;   MEMBRANE PEEL Left 09/06/2022   Procedure: MEMBRANECTOMY;  Surgeon: Carmela Rima,  MD;  Location: [facility] OR;  Service: Ophthalmology;  Laterality: Left;   PARS PLANA VITRECTOMY Left 09/06/2022   Procedure: PARS PLANA VITRECTOMY WITH 25 GAUGE;  Surgeon: Carmela Rima, MD;  Location: [facility] OR;  Service: Ophthalmology;  Laterality: Left;   PHOTOCOAGULATION WITH LASER Left 09/06/2022   Procedure: PHOTOCOAGULATION WITH LASER;  Surgeon: Carmela Rima, MD;  Location: [facility] OR;  Service: Ophthalmology;  Laterality: Left;   PHOTOCOAGULATION WITH LASER Right 11/22/2022   Procedure: PHOTOCOAGULATION WITH LASER;  Surgeon: Carmela Rima, MD;  Location: [facility] OR;  Service: Ophthalmology;  Laterality: Right;   POSTERIOR CERVICAL FUSION/FORAMINOTOMY N/A 07/19/2022   Procedure: Posterior cervical fusion with lateral mass fixation - Cervical four-cervical five, Cervical five-Cervical six - Cervical six-Cervical seven - Cervical seven-Thoracic one with laminectomy;  Surgeon: Julio Sicks, MD;  Location: [facility] OR;  Service: Neurosurgery;  Laterality: N/A;   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   Procedure: REPAIR OF COMPLEX TRACTION RETINAL DETACHMENT;  Surgeon: Carmela Rima, MD;  Location: [facility] OR;  Service: Ophthalmology;  Laterality: Left;  MAC WITH BLOCK   REPAIR OF COMPLEX TRACTION RETINAL DETACHMENT Right 11/22/2022   Procedure: REPAIR OF COMPLEX TRACTION RETINAL DETACHMENT;  Surgeon: Carmela Rima, MD;  Location: [facility] OR;  Service: Ophthalmology;  Laterality: Right;  MAC WITH BLOCK   TUBAL LIGATION      Family History  Problem Relation Age of Onset   Stroke Mother    Hypertension Mother    Heart failure Father    Asthma Father    Diabetes Father    Heart disease Father    Emphysema Paternal Grandfather    Colon cancer Neg Hx    Colon polyps Neg Hx     Allergies as of 02/02/2023 - Review Complete 01/10/2023  Allergen Reaction Noted   Metformin and related Nausea And Vomiting 07/14/2022    Social History   Socioeconomic History   Marital status: Single    Spouse  name: Not on file   Number of children: 2   Years of education: Not on file   Highest education level: Not on file  Occupational History   Not on file  Tobacco Use   Smoking status: Former    Current packs/day: 0.00    Types: Cigarettes    Quit date: 03/14/1982    Years since quitting: 40.9    Passive exposure: Past  Smokeless tobacco: Current    Types: Snuff  Vaping Use   Vaping status: Never Used  Substance and Sexual Activity   Alcohol use: No   Drug use: No   Sexual activity: Yes    Birth control/protection: Surgical    Comment: tubal  Other Topics Concern   Not on file  Social History Narrative   Not on file   Social Determinants of Health   Financial Resource Strain: Not on file  Food Insecurity: Not on file  Transportation Needs: Not on file  Physical Activity: Not on file  Stress: Not on file  Social Connections: Not on file     Review of Systems   Gen: Denies fever, chills, anorexia. Denies fatigue, weakness, weight loss.  CV: Denies chest pain, palpitations, syncope, peripheral edema, and claudication. Resp: Denies dyspnea at rest, cough, wheezing, coughing up blood, and pleurisy. GI: See HPI Derm: Denies rash, itching, dry skin Psych: Denies depression, anxiety, memory loss, confusion. No homicidal or suicidal ideation.  Heme: Denies bruising, bleeding, and enlarged lymph nodes.  Physical Exam   LMP 01/21/2012   General:   Alert and oriented. No distress noted. Pleasant and cooperative.  Head:  Normocephalic and atraumatic. Eyes:  Conjuctiva clear without scleral icterus. Mouth:  Oral mucosa pink and moist. Good dentition. No lesions. Lungs:  Clear to auscultation bilaterally. No wheezes, rales, or rhonchi. No distress.  Heart:  S1, S2 present without murmurs appreciated.  Abdomen:  +BS, soft, non-tender and non-distended. No rebound or guarding. No HSM or masses noted. Rectal: *** Msk:  Symmetrical without gross deformities. Normal  posture. Extremities:  Without edema. Neurologic:  Alert and  oriented x4 Psych:  Alert and cooperative. Normal mood and affect.  Assessment  Alexandria Webster is a 57 y.o. female with a history of constipation, asthma, COPD, diabetes, gastroparesis, GERD, HTN, hypothyroidism, and PVD presenting today with ***  OIC, IBS-C:  GERD:  Weight loss, N/V:  PLAN   ***     Brooke Bonito, MSN, FNP-BC, AGACNP-BC [facility]

## 2023-02-02 ENCOUNTER — Ambulatory Visit: Payer: 59 | Admitting: Gastroenterology

## 2023-02-07 ENCOUNTER — Encounter (HOSPITAL_COMMUNITY): Payer: 59

## 2023-02-07 ENCOUNTER — Encounter (HOSPITAL_COMMUNITY): Payer: 59 | Admitting: Occupational Therapy

## 2023-02-13 ENCOUNTER — Other Ambulatory Visit: Payer: Self-pay

## 2023-02-13 ENCOUNTER — Encounter (HOSPITAL_COMMUNITY): Payer: Self-pay | Admitting: Ophthalmology

## 2023-02-13 NOTE — H&P (Signed)
  Date of examination:  02/13/2023  Indication for surgery: retained silicone oil left eye  Pertinent past medical history:  Past Medical History:  Diagnosis Date   Asthma    COPD (chronic obstructive pulmonary disease) (HCC)    DDD (degenerative disc disease), lumbar    Depression    Diabetes mellitus    Gastroparesis    GERD without esophagitis    High cholesterol    Hypertension    Hypothyroidism    PONV (postoperative nausea and vomiting)    PVD (peripheral vascular disease) (HCC)    Wears glasses     Pertinent ocular history:  retinal detachment left eye  Pertinent family history:  Family History  Problem Relation Age of Onset   Stroke Mother    Hypertension Mother    Heart failure Father    Asthma Father    Diabetes Father    Heart disease Father    Emphysema Paternal Grandfather    Colon cancer Neg Hx    Colon polyps Neg Hx     General:  Healthy appearing patient in no distress.    Eyes:    Acuity  OS 20/200   External: Within normal limits      Anterior segment: Within normal limits     Fundus: gliosis, pucker, laser scars        Impression: Retained silicone oil left eye  Plann: Vitrectomy, endolaser and removal of retained silicone oil left eye  Carmela Rima, MD

## 2023-02-13 NOTE — Progress Notes (Signed)
SDW CALL  Patient was given pre-op instructions over the phone. The opportunity was given for the patient to ask questions. No further questions asked. Patient verbalized understanding of instructions given.   PCP - Dr. Assunta Found Cardiologist - denies  PPM/ICD - denies Device Orders - n/a Rep Notified - n/a  Chest x-ray - 07/25/22 - 1 view EKG - 07/15/22 Stress Test - denies ECHO - 08/07/22 Cardiac Cath - denies  Sleep Study - denies CPAP - n/a  Fasting Blood Sugar - patient currently does not check her blood sugar on a regular basis   Last dose of GLP1 agonist-  Trulicity - last dose 02/04/23  Blood Thinner Instructions: n/a Aspirin Instructions: patient states that last dose of Aspirin 325 mg was 11/30 and per Dr. Allena Katz, patient can hold Aspirin  ERAS Protcol - clears until 1300   COVID TEST- n/a   Anesthesia review: yes- sent to [facility]  Patient denies shortness of breath, fever, cough and chest pain over the phone call   All instructions explained to the patient, with a verbal understanding of the material. Patient agrees to go over the instructions while at home for a better understanding.

## 2023-02-14 ENCOUNTER — Ambulatory Visit (HOSPITAL_BASED_OUTPATIENT_CLINIC_OR_DEPARTMENT_OTHER): Payer: Self-pay | Admitting: Physician Assistant

## 2023-02-14 ENCOUNTER — Encounter (HOSPITAL_COMMUNITY): Payer: 59

## 2023-02-14 ENCOUNTER — Encounter (HOSPITAL_COMMUNITY): Payer: 59 | Admitting: Occupational Therapy

## 2023-02-14 ENCOUNTER — Ambulatory Visit (HOSPITAL_COMMUNITY): Payer: 59 | Admitting: Physician Assistant

## 2023-02-14 ENCOUNTER — Ambulatory Visit (HOSPITAL_COMMUNITY)
Admission: RE | Admit: 2023-02-14 | Discharge: 2023-02-14 | Disposition: A | Discharge status: Home / Self Care | Payer: 59 | Attending: Ophthalmology | Admitting: Ophthalmology

## 2023-02-14 ENCOUNTER — Encounter (HOSPITAL_COMMUNITY): Payer: Self-pay | Admitting: Ophthalmology

## 2023-02-14 ENCOUNTER — Encounter (HOSPITAL_COMMUNITY)
Admission: RE | Disposition: A | Discharge status: Home / Self Care | Payer: Self-pay | Source: Home / Self Care | Attending: Ophthalmology

## 2023-02-14 DIAGNOSIS — Z87891 Personal history of nicotine dependence: Secondary | ICD-10-CM | POA: Insufficient documentation

## 2023-02-14 DIAGNOSIS — T85398A Other mechanical complication of other ocular prosthetic devices, implants and grafts, initial encounter: Secondary | ICD-10-CM | POA: Insufficient documentation

## 2023-02-14 DIAGNOSIS — E039 Hypothyroidism, unspecified: Secondary | ICD-10-CM

## 2023-02-14 DIAGNOSIS — J4489 Other specified chronic obstructive pulmonary disease: Secondary | ICD-10-CM | POA: Diagnosis not present

## 2023-02-14 DIAGNOSIS — E1143 Type 2 diabetes mellitus with diabetic autonomic (poly)neuropathy: Secondary | ICD-10-CM | POA: Diagnosis not present

## 2023-02-14 DIAGNOSIS — K219 Gastro-esophageal reflux disease without esophagitis: Secondary | ICD-10-CM | POA: Insufficient documentation

## 2023-02-14 DIAGNOSIS — I739 Peripheral vascular disease, unspecified: Secondary | ICD-10-CM | POA: Diagnosis not present

## 2023-02-14 DIAGNOSIS — K3184 Gastroparesis: Secondary | ICD-10-CM | POA: Diagnosis not present

## 2023-02-14 DIAGNOSIS — E1151 Type 2 diabetes mellitus with diabetic peripheral angiopathy without gangrene: Secondary | ICD-10-CM | POA: Insufficient documentation

## 2023-02-14 DIAGNOSIS — J449 Chronic obstructive pulmonary disease, unspecified: Secondary | ICD-10-CM | POA: Diagnosis not present

## 2023-02-14 DIAGNOSIS — X58XXXA Exposure to other specified factors, initial encounter: Secondary | ICD-10-CM | POA: Diagnosis not present

## 2023-02-14 DIAGNOSIS — I10 Essential (primary) hypertension: Secondary | ICD-10-CM | POA: Diagnosis not present

## 2023-02-14 DIAGNOSIS — E119 Type 2 diabetes mellitus without complications: Secondary | ICD-10-CM | POA: Diagnosis not present

## 2023-02-14 DIAGNOSIS — H338 Other retinal detachments: Secondary | ICD-10-CM | POA: Diagnosis not present

## 2023-02-14 DIAGNOSIS — T85398S Other mechanical complication of other ocular prosthetic devices, implants and grafts, sequela: Secondary | ICD-10-CM | POA: Diagnosis not present

## 2023-02-14 HISTORY — PX: PHOTOCOAGULATION WITH LASER: SHX6027

## 2023-02-14 HISTORY — PX: SILICON OIL REMOVAL: SHX5305

## 2023-02-14 HISTORY — PX: PARS PLANA VITRECTOMY: SHX2166

## 2023-02-14 LAB — POCT I-STAT, CHEM 8
BUN: 21 mg/dL — ABNORMAL HIGH (ref 6–20)
Calcium, Ion: 1.2 mmol/L (ref 1.15–1.40)
Chloride: 103 mmol/L (ref 98–111)
Creatinine, Ser: 0.8 mg/dL (ref 0.44–1.00)
Glucose, Bld: 199 mg/dL — ABNORMAL HIGH (ref 70–99)
HCT: 41 % (ref 36.0–46.0)
Hemoglobin: 13.9 g/dL (ref 12.0–15.0)
Potassium: 4.5 mmol/L (ref 3.5–5.1)
Sodium: 138 mmol/L (ref 135–145)
TCO2: 27 mmol/L (ref 22–32)

## 2023-02-14 LAB — GLUCOSE, CAPILLARY
Glucose-Capillary: 216 mg/dL — ABNORMAL HIGH (ref 70–99)
Glucose-Capillary: 152 mg/dL — ABNORMAL HIGH (ref 70–99)
Glucose-Capillary: 124 mg/dL — ABNORMAL HIGH (ref 70–99)

## 2023-02-14 SURGERY — REMOVAL, SILICONE OIL, EYE
Anesthesia: General | Site: Eye | Laterality: Left

## 2023-02-14 MED ORDER — MIDAZOLAM HCL 2 MG/2ML IJ SOLN
INTRAMUSCULAR | Status: AC
  Filled 2023-02-14: qty 2

## 2023-02-14 MED ORDER — FENTANYL CITRATE (PF) 250 MCG/5ML IJ SOLN
INTRAMUSCULAR | Status: AC
  Filled 2023-02-14: qty 5

## 2023-02-14 MED ORDER — MIDAZOLAM HCL 2 MG/2ML IJ SOLN
INTRAMUSCULAR | Status: DC | PRN
  Administered 2023-02-14 (×2): 1 mg via INTRAVENOUS

## 2023-02-14 MED ORDER — PHENYLEPHRINE HCL-NACL 20-0.9 MG/250ML-% IV SOLN
INTRAVENOUS | Status: AC
  Filled 2023-02-14: qty 250

## 2023-02-14 MED ORDER — PROPOFOL 1000 MG/100ML IV EMUL
INTRAVENOUS | Status: AC
Start: 2023-02-14 — End: ?
  Filled 2023-02-14: qty 200

## 2023-02-14 MED ORDER — OFLOXACIN 0.3 % OP SOLN
1.0000 [drp] | OPHTHALMIC | Status: AC | PRN
  Administered 2023-02-14 (×3): 1 [drp] via OPHTHALMIC
  Filled 2023-02-14: qty 5

## 2023-02-14 MED ORDER — PROPARACAINE HCL 0.5 % OP SOLN
1.0000 [drp] | OPHTHALMIC | Status: AC | PRN
  Administered 2023-02-14 (×3): 1 [drp] via OPHTHALMIC
  Filled 2023-02-14: qty 15

## 2023-02-14 MED ORDER — HYALURONIDASE HUMAN 150 UNIT/ML IJ SOLN
INTRAMUSCULAR | Status: AC
  Filled 2023-02-14: qty 1

## 2023-02-14 MED ORDER — INSULIN ASPART 100 UNIT/ML IJ SOLN
0.0000 [IU] | INTRAMUSCULAR | Status: DC | PRN
  Administered 2023-02-14: 2 [IU] via SUBCUTANEOUS

## 2023-02-14 MED ORDER — PHENYLEPHRINE HCL 10 % OP SOLN
1.0000 [drp] | OPHTHALMIC | Status: AC | PRN
  Administered 2023-02-14 (×3): 1 [drp] via OPHTHALMIC
  Filled 2023-02-14: qty 5

## 2023-02-14 MED ORDER — FENTANYL CITRATE (PF) 250 MCG/5ML IJ SOLN
INTRAMUSCULAR | Status: DC | PRN
  Administered 2023-02-14: 25 ug via INTRAVENOUS

## 2023-02-14 MED ORDER — INSULIN ASPART 100 UNIT/ML IJ SOLN
INTRAMUSCULAR | Status: AC
  Filled 2023-02-14: qty 1

## 2023-02-14 MED ORDER — ATROPINE SULFATE 1 % OP SOLN
OPHTHALMIC | Status: AC
  Filled 2023-02-14: qty 5

## 2023-02-14 MED ORDER — CYCLOPENTOLATE HCL 1 % OP SOLN
1.0000 [drp] | OPHTHALMIC | Status: AC | PRN
  Administered 2023-02-14 (×3): 1 [drp] via OPHTHALMIC
  Filled 2023-02-14: qty 2

## 2023-02-14 MED ORDER — SODIUM HYALURONATE 10 MG/ML IO SOLUTION
PREFILLED_SYRINGE | INTRAOCULAR | Status: AC
  Filled 2023-02-14: qty 0.85

## 2023-02-14 MED ORDER — BSS PLUS IO SOLN
INTRAOCULAR | Status: AC
  Filled 2023-02-14: qty 500

## 2023-02-14 MED ORDER — BUPIVACAINE HCL (PF) 0.75 % IJ SOLN
INTRAMUSCULAR | Status: AC
  Filled 2023-02-14: qty 10

## 2023-02-14 MED ORDER — CEFAZOLIN SUBCONJUNCTIVAL INJECTION 100 MG/0.5 ML
INJECTION | SUBCONJUNCTIVAL | Status: DC | PRN
  Administered 2023-02-14: 100 mg via SUBCONJUNCTIVAL

## 2023-02-14 MED ORDER — LIDOCAINE HCL (PF) 2 % IJ SOLN
INTRAMUSCULAR | Status: DC | PRN
  Administered 2023-02-14: 5 mL

## 2023-02-14 MED ORDER — TOBRAMYCIN-DEXAMETHASONE 0.3-0.1 % OP OINT
TOPICAL_OINTMENT | OPHTHALMIC | Status: DC | PRN
  Administered 2023-02-14: 1 via OPHTHALMIC

## 2023-02-14 MED ORDER — EPINEPHRINE PF 1 MG/ML IJ SOLN
INTRAOCULAR | Status: DC | PRN
  Administered 2023-02-14: 500.3 mL

## 2023-02-14 MED ORDER — EPINEPHRINE PF 1 MG/ML IJ SOLN
INTRAMUSCULAR | Status: AC
  Filled 2023-02-14: qty 1

## 2023-02-14 MED ORDER — SODIUM CHLORIDE 0.9 % IV SOLN: INTRAVENOUS | Status: DC | PRN

## 2023-02-14 MED ORDER — SODIUM HYALURONATE 10 MG/ML IO SOLUTION
PREFILLED_SYRINGE | INTRAOCULAR | Status: DC | PRN
  Administered 2023-02-14: .85 mL via INTRAOCULAR

## 2023-02-14 MED ORDER — BSS IO SOLN
INTRAOCULAR | Status: AC
  Filled 2023-02-14: qty 15

## 2023-02-14 MED ORDER — PHENYLEPHRINE 80 MCG/ML (10ML) SYRINGE FOR IV PUSH (FOR BLOOD PRESSURE SUPPORT)
PREFILLED_SYRINGE | INTRAVENOUS | Status: DC | PRN
  Administered 2023-02-14 (×4): 80 ug via INTRAVENOUS

## 2023-02-14 MED ORDER — ORAL CARE MOUTH RINSE: 15.0000 mL | Freq: Once | OROMUCOSAL | Status: AC

## 2023-02-14 MED ORDER — PROPOFOL 10 MG/ML IV BOLUS
INTRAVENOUS | Status: DC | PRN
  Administered 2023-02-14: 20 mg via INTRAVENOUS
  Administered 2023-02-14: 10 mg via INTRAVENOUS
  Administered 2023-02-14: 20 mg via INTRAVENOUS

## 2023-02-14 MED ORDER — TOBRAMYCIN-DEXAMETHASONE 0.3-0.1 % OP OINT
TOPICAL_OINTMENT | OPHTHALMIC | Status: AC
  Filled 2023-02-14: qty 3.5

## 2023-02-14 MED ORDER — LIDOCAINE HCL 2 % IJ SOLN
INTRAMUSCULAR | Status: AC
  Filled 2023-02-14: qty 20

## 2023-02-14 MED ORDER — MIDODRINE HCL 5 MG PO TABS
10.0000 mg | ORAL_TABLET | Freq: Once | ORAL | Status: AC
  Administered 2023-02-14: 10 mg via ORAL
  Filled 2023-02-14: qty 2

## 2023-02-14 MED ORDER — DEXAMETHASONE SODIUM PHOSPHATE 10 MG/ML IJ SOLN
INTRAMUSCULAR | Status: AC
  Filled 2023-02-14: qty 1

## 2023-02-14 MED ORDER — DEXAMETHASONE SODIUM PHOSPHATE 10 MG/ML IJ SOLN
INTRAMUSCULAR | Status: DC | PRN
  Administered 2023-02-14: 10 mg

## 2023-02-14 MED ORDER — CHLORHEXIDINE GLUCONATE 0.12 % MT SOLN
15.0000 mL | Freq: Once | OROMUCOSAL | Status: AC
  Administered 2023-02-14: 15 mL via OROMUCOSAL
  Filled 2023-02-14: qty 15

## 2023-02-14 MED ORDER — CEFAZOLIN SUBCONJUNCTIVAL INJECTION 100 MG/0.5 ML
100.0000 mg | INJECTION | SUBCONJUNCTIVAL | Status: DC
  Filled 2023-02-14: qty 5

## 2023-02-14 MED ORDER — TETRACAINE HCL 0.5 % OP SOLN
OPHTHALMIC | Status: AC
  Filled 2023-02-14: qty 4

## 2023-02-14 SURGICAL SUPPLY — 60 items
APPLICATOR COTTON TIP 6 STRL (MISCELLANEOUS) ×3 IMPLANT
APPLICATOR COTTON TIP 6IN STRL (MISCELLANEOUS) ×2
BAND WRIST GAS GREEN (MISCELLANEOUS) IMPLANT
BLADE MVR KNIFE 20G (BLADE) IMPLANT
BLADE STAB KNIFE 15DEG (BLADE) IMPLANT
BNDG EYE OVAL 2 1/8 X 2 5/8 (GAUZE/BANDAGES/DRESSINGS) ×1 IMPLANT
CABLE BIPOLOR RESECTION CORD (MISCELLANEOUS) IMPLANT
CANNULA ANT CHAM MAIN (OPHTHALMIC RELATED) IMPLANT
CANNULA DUAL BORE 23G (CANNULA) IMPLANT
CANNULA DUALBORE 25G (CANNULA) IMPLANT
CANNULA VLV SOFT TIP 25G (OPHTHALMIC) ×2 IMPLANT
CANNULA VLV SOFT TIP 25GA (OPHTHALMIC) ×2 IMPLANT
CAUTERY EYE LOW TEMP 1300F FIN (OPHTHALMIC RELATED) IMPLANT
CLSR STERI-STRIP ANTIMIC 1/2X4 (GAUZE/BANDAGES/DRESSINGS) ×3 IMPLANT
COVER MAYO STAND STRL (DRAPES) IMPLANT
DRAPE HALF SHEET 40X57 (DRAPES) ×3 IMPLANT
DRAPE INCISE 51X51 W/FILM STRL (DRAPES) ×3 IMPLANT
DRAPE RETRACTOR (MISCELLANEOUS) ×3 IMPLANT
ERASER HMR WETFIELD 23G BP (MISCELLANEOUS) IMPLANT
FORCEPS ECKARDT ILM 25G SERR (OPHTHALMIC RELATED) IMPLANT
FORCEPS GRIESHABER ILM 25G A (INSTRUMENTS) IMPLANT
GAS AUTO FILL CONSTEL (OPHTHALMIC)
GAS AUTO FILL CONSTELLATION (OPHTHALMIC) IMPLANT
GLOVE SURG SYN 7.5 E (GLOVE) ×4 IMPLANT
GLOVE SURG SYN 7.5 PF PI (GLOVE) ×2 IMPLANT
GOWN STRL REUS W/ TWL LRG LVL3 (GOWN DISPOSABLE) ×6 IMPLANT
KIT BASIN OR (CUSTOM PROCEDURE TRAY) ×3 IMPLANT
KIT TURNOVER KIT B (KITS) ×3 IMPLANT
LENS BIOM SUPER VIEW SET DISP (MISCELLANEOUS) ×3 IMPLANT
MICROPICK 25G (MISCELLANEOUS)
NDL 18GX1X1/2 (RX/OR ONLY) (NEEDLE) ×2 IMPLANT
NDL 25GX 5/8IN NON SAFETY (NEEDLE) ×2 IMPLANT
NDL 27GX1/2 REG BEVEL ECLIP (NEEDLE) ×2 IMPLANT
NDL FILTER BLUNT 18X1 1/2 (NEEDLE) ×2 IMPLANT
NDL HYPO 30X.5 LL (NEEDLE) ×4 IMPLANT
NDL RETROBULBAR 25GX1.5 (NEEDLE) ×2 IMPLANT
NEEDLE 18GX1X1/2 (RX/OR ONLY) (NEEDLE) ×2 IMPLANT
NEEDLE 25GX 5/8IN NON SAFETY (NEEDLE) IMPLANT
NEEDLE 27GX1/2 REG BEVEL ECLIP (NEEDLE) IMPLANT
NEEDLE FILTER BLUNT 18X1 1/2 (NEEDLE) ×2 IMPLANT
NEEDLE HYPO 30X.5 LL (NEEDLE) IMPLANT
NEEDLE RETROBULBAR 25GX1.5 (NEEDLE) ×2 IMPLANT
NS IRRIG 1000ML POUR BTL (IV SOLUTION) ×2 IMPLANT
PACK FRAGMATOME (OPHTHALMIC) IMPLANT
PACK VITRECTOMY CUSTOM (CUSTOM PROCEDURE TRAY) ×3 IMPLANT
PAD ARMBOARD 7.5X6 YLW CONV (MISCELLANEOUS) ×5 IMPLANT
PAK PIK VITRECTOMY CVS 25GA (OPHTHALMIC) ×3 IMPLANT
PICK MICROPICK 25G (MISCELLANEOUS) IMPLANT
PROBE ENDO DIATHERMY 25G (MISCELLANEOUS) ×2 IMPLANT
PROBE LASER ILLUM FLEX CVD 25G (OPHTHALMIC) ×1 IMPLANT
SCRAPER DIAMOND 25GA (OPHTHALMIC RELATED) IMPLANT
SET INJECTOR OIL FLUID CONSTEL (OPHTHALMIC) ×1 IMPLANT
SHIELD EYE LENSE ONLY DISP (GAUZE/BANDAGES/DRESSINGS) ×1 IMPLANT
SOL ANTI FOG 6CC (MISCELLANEOUS) ×3 IMPLANT
STOPCOCK 4 WAY LG BORE MALE ST (IV SETS) IMPLANT
SUT VICRYL 7 0 TG140 8 (SUTURE) ×2 IMPLANT
SUT VICRYL 8 0 TG140 8 (SUTURE) IMPLANT
SYR 10ML LL (SYRINGE) IMPLANT
SYR TB 1ML LUER SLIP (SYRINGE) ×5 IMPLANT
WATER STERILE IRR 1000ML POUR (IV SOLUTION) ×3 IMPLANT

## 2023-02-14 NOTE — Anesthesia Preprocedure Evaluation (Signed)
Anesthesia Evaluation  Patient identified by MRN, date of birth, ID band Patient awake    Reviewed: Allergy & Precautions, H&P , NPO status , Patient's Chart, lab work & pertinent test results  Airway Mallampati: II  TM Distance: >3 FB Neck ROM: Full    Dental no notable dental hx.    Pulmonary COPD, former smoker   Pulmonary exam normal breath sounds clear to auscultation       Cardiovascular hypertension, Normal cardiovascular exam Rhythm:Regular Rate:Normal     Neuro/Psych negative neurological ROS  negative psych ROS   GI/Hepatic Neg liver ROS,GERD  ,,gastroparesis   Endo/Other  diabetes, Insulin DependentHypothyroidism    Renal/GU negative Renal ROS  negative genitourinary   Musculoskeletal negative musculoskeletal ROS (+)    Abdominal   Peds negative pediatric ROS (+)  Hematology negative hematology ROS (+)   Anesthesia Other Findings   Reproductive/Obstetrics negative OB ROS                             Anesthesia Physical Anesthesia Plan  ASA: 3  Anesthesia Plan: General   Post-op Pain Management: Minimal or no pain anticipated   Induction: Intravenous  PONV Risk Score and Plan: 2 and Dexamethasone and Treatment may vary due to age or medical condition  Airway Management Planned: Oral ETT  Additional Equipment:   Intra-op Plan:   Post-operative Plan: Extubation in OR  Informed Consent: I have reviewed the patients History and Physical, chart, labs and discussed the procedure including the risks, benefits and alternatives for the proposed anesthesia with the patient or authorized representative who has indicated his/her understanding and acceptance.     Dental advisory given  Plan Discussed with: CRNA and Surgeon  Anesthesia Plan Comments:        Anesthesia Quick Evaluation

## 2023-02-14 NOTE — Brief Op Note (Signed)
02/14/2023  6:13 PM  PATIENT:  Alexandria Webster  56 y.o. female  PRE-OPERATIVE DIAGNOSIS:  RETAINED SILICONE OIL LEFT EYE  POST-OPERATIVE DIAGNOSIS:  RETAINED SILICONE OIL LEFT EYE  PROCEDURE:  Procedure(s): SILICON OIL REMOVAL (Left) PARS PLANA VITRECTOMY WITH 25 GAUGE (Left) PHOTOCOAGULATION WITH LASER (Left)  SURGEON:  Surgeons and Role:    * Carmela Rima, MD - Primary  PHYSICIAN ASSISTANT:   ASSISTANTS: none   ANESTHESIA:   local and MAC  EBL:  minimal   BLOOD ADMINISTERED:none  DRAINS: none   LOCAL MEDICATIONS USED:  MARCAINE    and LIDOCAINE   SPECIMEN:  No Specimen  DISPOSITION OF SPECIMEN:  N/A  COUNTS:  YES  TOURNIQUET:  * No tourniquets in log *  DICTATION: .Note written in EPIC  PLAN OF CARE: Discharge to home after PACU  PATIENT DISPOSITION:  PACU - hemodynamically stable.   Delay start of Pharmacological VTE agent (>24hrs) due to surgical blood loss or risk of bleeding: not applicable

## 2023-02-14 NOTE — Transfer of Care (Signed)
Immediate Anesthesia Transfer of Care Note  Patient: Alexandria Webster  Procedure(s) Performed: SILICON OIL REMOVAL (Left: Eye) PARS PLANA VITRECTOMY WITH 25 GAUGE (Left: Eye) PHOTOCOAGULATION WITH LASER (Left: Eye)  Patient Location: PACU  Anesthesia Type:MAC  Level of Consciousness: awake, alert , and oriented  Airway & Oxygen Therapy: Patient Spontanous Breathing  Post-op Assessment: Report given to RN  Post vital signs: Reviewed and stable  Last Vitals:  Vitals Value Taken Time  BP 81/29 02/14/23 1817  Temp    Pulse 94 02/14/23 1817  Resp 12 02/14/23 1817  SpO2 98 % 02/14/23 1817    Last Pain:  Vitals:   02/14/23 1817  TempSrc:   PainSc: 0-No pain         Complications: No notable events documented.

## 2023-02-14 NOTE — Discharge Instructions (Addendum)
DO NOT SLEEP ON BACK, THE EYE PRESSURE CAN GO UP AND CAUSE VISION LOSS   SLEEP ON SIDE WITH NOSE TO PILLOW  DURING DAY KEEP UPRIGHT 

## 2023-02-15 ENCOUNTER — Encounter (HOSPITAL_COMMUNITY): Payer: Self-pay | Admitting: Ophthalmology

## 2023-02-16 NOTE — Op Note (Signed)
Alexandria Webster 02/14/2023 Diagnosis: Retained silicone oil left eye  Procedure: Pars Plana Vitrectomy, Endolaser, and partial air/fluid exchange Operative Eye:  left eye  Surgeon: Harrold Donath Estimated Blood Loss: minimal Specimens for Pathology:  None Complications: none   The  patient was prepped and draped in the usual fashion for ocular surgery on the  left eye .  A lid speculum was placed.  Infusion line and trocar was placed at the 4 o'clock position approximately 3.5 mm from the surgical limbus.   The infusion line was allowed to run and then clamped when placed at the cannula opening. The line was inserted and secured to the drape with an adhesive strip.   Active trocars/cannula were placed at the 10 and 2 o'clock positions approximately 3.5 mm from the surgical limbus. The cannula was visualized in the vitreous cavity.  The light pipe and visous fluid extractor were inserted into the vitreous cavity and silicone oil removed from the eye.  Remaining silicone oil was removed with the vitrector.   3 rows of endolaser were applied 360 degrees to the periphery.  A partial air-fluid exchange was performed.  The superior cannulas were sequentially removed with concommitant tamponade using a cotton tipped applicator and noted to be air tight.  The infusion line and trocar were removed and the sclerotomy was noted to be air tight with normal intraocular pressure by digital palpapation.  Subconjunctival injections of Ancef and Dexamethasone 4mg /30ml were placed in the infero-medial quadrant.   The speculum and drapes were removed and the eye was patched with Polymixin/Bacitracin ophthalmic ointment. An eye shield was placed and the patient was transferred alert and conversant with stable vital signs to the post operative recovery area.  The patient tolerated the procedure well and no complications were noted.  Harrold Donath MD

## 2023-02-16 NOTE — Anesthesia Postprocedure Evaluation (Signed)
Anesthesia Post Note  Patient: Alexandria Webster  Procedure(s) Performed: SILICON OIL REMOVAL (Left: Eye) PARS PLANA VITRECTOMY WITH 25 GAUGE (Left: Eye) PHOTOCOAGULATION WITH LASER (Left: Eye)     Patient location during evaluation: PACU Anesthesia Type: General Level of consciousness: awake and alert Pain management: pain level controlled Vital Signs Assessment: post-procedure vital signs reviewed and stable Respiratory status: spontaneous breathing, nonlabored ventilation, respiratory function stable and patient connected to nasal cannula oxygen Cardiovascular status: stable and blood pressure returned to baseline Postop Assessment: no apparent nausea or vomiting Anesthetic complications: no  No notable events documented.  Last Vitals:  Vitals:   02/14/23 1830 02/14/23 1845  BP: 115/69 131/64  Pulse: 81 81  Resp: 14 13  Temp:  37 C  SpO2: 96% 99%    Last Pain:  Vitals:   02/14/23 1817  TempSrc:   PainSc: 0-No pain                 Harjot Zavadil S

## 2023-02-21 ENCOUNTER — Encounter: Payer: 59 | Admitting: Registered Nurse

## 2023-02-22 ENCOUNTER — Encounter (HOSPITAL_COMMUNITY): Payer: 59 | Admitting: Occupational Therapy

## 2023-02-22 ENCOUNTER — Telehealth (HOSPITAL_COMMUNITY): Payer: Self-pay | Admitting: Occupational Therapy

## 2023-02-22 NOTE — Telephone Encounter (Signed)
This OT called and spoke with pt's daughter regarding her No Show to appointment on 02/22/23 at 11 am. Daughter reported that pt has been having issues with her eyes and other medical concerns popping up. She will discharge from therapy at this time, and will obtain a new referral once able to participate consistently with OT.   Trish Mage, OTR/L [facility] [phone]

## 2023-02-23 NOTE — Progress Notes (Unsigned)
Subjective:    Patient ID: Alexandria Webster, female    DOB: November 11, 1965, 57 y.o.   MRN: [id number]  HPI   Pain Inventory Average Pain {NUMBERS; 0-10:5044} Pain Right Now {NUMBERS; 0-10:5044} My pain is {PAIN DESCRIPTION:21022940}  In the last 24 hours, has pain interfered with the following? General activity {NUMBERS; 0-10:5044} Relation with others {NUMBERS; 0-10:5044} Enjoyment of life {NUMBERS; 0-10:5044} What TIME of day is your pain at its worst? {time of day:24191} Sleep (in general) {BHH GOOD/FAIR/POOR:22877}  Pain is worse with: {ACTIVITIES:21022942} Pain improves with: {PAIN IMPROVES QMVH:84696295} Relief from Meds: {NUMBERS; 0-10:5044}  Family History  Problem Relation Age of Onset   Stroke Mother    Hypertension Mother    Heart failure Father    Asthma Father    Diabetes Father    Heart disease Father    Emphysema Paternal Grandfather    Colon cancer Neg Hx    Colon polyps Neg Hx    Social History   Socioeconomic History   Marital status: Single    Spouse name: Not on file   Number of children: 2   Years of education: Not on file   Highest education level: Not on file  Occupational History   Not on file  Tobacco Use   Smoking status: Former    Current packs/day: 0.00    Types: Cigarettes    Quit date: 03/14/1982    Years since quitting: 40.9    Passive exposure: Past   Smokeless tobacco: Current    Types: Snuff  Vaping Use   Vaping status: Never Used  Substance and Sexual Activity   Alcohol use: No   Drug use: No   Sexual activity: Yes    Birth control/protection: Surgical    Comment: tubal  Other Topics Concern   Not on file  Social History Narrative   Not on file   Social Drivers of Health   Financial Resource Strain: Not on file  Food Insecurity: Not on file  Transportation Needs: Not on file  Physical Activity: Not on file  Stress: Not on file  Social Connections: Not on file   Past Surgical History:  Procedure Laterality Date    AIR/FLUID EXCHANGE Right 11/22/2022   Procedure: AIR/FLUID EXCHANGE;  Surgeon: Carmela Rima, MD;  Location: [facility] OR;  Service: Ophthalmology;  Laterality: Right;   AORTA - BILATERAL FEMORAL ARTERY BYPASS GRAFT N/A 09/27/2018   Procedure: Redo Exposure  AORTOBIFEMORAL Bypass and bilateral femoral Artery ; Redo Aortobifemoral  BYPASS GRAFT.;  Surgeon: Cephus Shelling, MD;  Location: [facility] OR;  Service: Vascular;  Laterality: N/A;   BACK SURGERY     BIOPSY  10/14/2021   Procedure: BIOPSY;  Surgeon: Lanelle Bal, DO;  [facility];  Service: Endoscopy;;   COLONOSCOPY WITH PROPOFOL N/A 10/14/2021   Procedure: COLONOSCOPY WITH PROPOFOL;  Surgeon: Lanelle Bal, DO;  [facility];  Service: Endoscopy;  Laterality: N/A;  8:30am   ESOPHAGOGASTRODUODENOSCOPY (EGD) WITH PROPOFOL N/A 10/14/2021   Procedure: ESOPHAGOGASTRODUODENOSCOPY (EGD) WITH PROPOFOL;  Surgeon: Lanelle Bal, DO;  [facility];  Service: Endoscopy;  Laterality: N/A;   FEMORAL ARTERY STENT  right leg   INJECTION OF SILICONE OIL Left 09/06/2022   Procedure: INJECTION OF SILICONE OIL;  Surgeon: Carmela Rima, MD;  Location[facility] OR;  Service: Ophthalmology;  Laterality: Left;   INJECTION OF SILICONE OIL Right 11/22/2022   Procedure: INJECTION OF SILICONE OIL;  Surgeon: Carmela Rima, MD;  [facility];  Service: Ophthalmology;  Laterality: Right;   MEMBRANE PEEL Left 09/06/2022   Procedure: MEMBRANECTOMY;  Surgeon: Carmela Rima, MD;  Location: [facility] OR;  Service: Ophthalmology;  Laterality: Left;   PARS PLANA VITRECTOMY Left 09/06/2022   Procedure: PARS PLANA VITRECTOMY WITH 25 GAUGE;  Surgeon: Carmela Rima, MD;  Location: [facility] OR;  Service: Ophthalmology;  Laterality: Left;   PARS PLANA VITRECTOMY Left 02/14/2023   Procedure: PARS PLANA VITRECTOMY WITH 25 GAUGE;  Surgeon: Carmela Rima, MD;  Location: [facility] OR;  Service: Ophthalmology;  Laterality: Left;   PHOTOCOAGULATION WITH LASER Left  09/06/2022   Procedure: PHOTOCOAGULATION WITH LASER;  Surgeon: Carmela Rima, MD;  Location: [facility] OR;  Service: Ophthalmology;  Laterality: Left;   PHOTOCOAGULATION WITH LASER Right 11/22/2022   Procedure: PHOTOCOAGULATION WITH LASER;  Surgeon: Carmela Rima, MD;  Location: [facility] OR;  Service: Ophthalmology;  Laterality: Right;   PHOTOCOAGULATION WITH LASER Left 02/14/2023   Procedure: PHOTOCOAGULATION WITH LASER;  Surgeon: Carmela Rima, MD;  Location: [facility] OR;  Service: Ophthalmology;  Laterality: Left;   POSTERIOR CERVICAL FUSION/FORAMINOTOMY N/A 07/19/2022   Procedure: Posterior cervical fusion with lateral mass fixation - Cervical four-cervical five, Cervical five-Cervical six - Cervical six-Cervical seven - Cervical seven-Thoracic one with laminectomy;  Surgeon: Julio Sicks, MD;  [facility];  Service: Neurosurgery;  Laterality: N/A;   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   Procedure: REPAIR OF COMPLEX TRACTION RETINAL DETACHMENT;  Surgeon: Carmela Rima, MD;  Location: [facility] OR;  Service: Ophthalmology;  Laterality: Left;  MAC WITH BLOCK   REPAIR OF COMPLEX TRACTION RETINAL DETACHMENT Right 11/22/2022   Procedure: REPAIR OF COMPLEX TRACTION RETINAL DETACHMENT;  Surgeon: Carmela Rima, MD;  Location: [facility] OR;  Service: Ophthalmology;  Laterality: Right;  MAC WITH BLOCK   SILICON OIL REMOVAL Left 02/14/2023   Procedure: SILICON OIL REMOVAL;  Surgeon: Carmela Rima, MD;  Location: [facility] OR;  Service: Ophthalmology;  Laterality: Left;   TUBAL LIGATION     Past Surgical History:  Procedure Laterality Date   AIR/FLUID EXCHANGE Right 11/22/2022   Procedure: AIR/FLUID EXCHANGE;  Surgeon: Carmela Rima, MD;  Location: [facility] OR;  Service: Ophthalmology;  Laterality: Right;   AORTA - BILATERAL FEMORAL ARTERY BYPASS GRAFT N/A 09/27/2018   Procedure: Redo Exposure  AORTOBIFEMORAL Bypass and bilateral femoral Artery ; Redo Aortobifemoral  BYPASS GRAFT.;  Surgeon: Cephus Shelling,  MD;  Location: [facility] OR;  Service: Vascular;  Laterality: N/A;   BACK SURGERY     BIOPSY  10/14/2021   Procedure: BIOPSY;  Surgeon: Lanelle Bal, DO;  [facility];  Service: Endoscopy;;   COLONOSCOPY WITH PROPOFOL N/A 10/14/2021   Procedure: COLONOSCOPY WITH PROPOFOL;  Surgeon: Lanelle Bal, DO;  [facility];  Service: Endoscopy;  Laterality: N/A;  8:30am   ESOPHAGOGASTRODUODENOSCOPY (EGD) WITH PROPOFOL N/A 10/14/2021   Procedure: ESOPHAGOGASTRODUODENOSCOPY (EGD) WITH PROPOFOL;  Surgeon: Lanelle Bal, DO;  [facility];  Service: Endoscopy;  Laterality: N/A;   FEMORAL ARTERY STENT  right leg   INJECTION OF SILICONE OIL Left 09/06/2022   Procedure: INJECTION OF SILICONE OIL;  Surgeon: Carmela Rima, MD;  Location: [facility] OR;  Service: Ophthalmology;  Laterality: Left;   INJECTION OF SILICONE OIL Right 11/22/2022   Procedure: INJECTION OF SILICONE OIL;  Surgeon: Carmela Rima, MD;  Location: [facility] OR;  Service: Ophthalmology;  Laterality: Right;   MEMBRANE PEEL Left 09/06/2022   Procedure: MEMBRANECTOMY;  Surgeon: Carmela Rima, MD;  Location: [facility] OR;  Service: Ophthalmology;  Laterality: Left;   PARS PLANA VITRECTOMY Left 09/06/2022   Procedure: PARS PLANA VITRECTOMY WITH 25 GAUGE;  Surgeon: Carmela Rima, MD;  Location: [facility] OR;  Service: Ophthalmology;  Laterality: Left;   PARS PLANA VITRECTOMY Left 02/14/2023   Procedure: PARS PLANA VITRECTOMY WITH 25 GAUGE;  Surgeon: Carmela Rima, MD;  Location: [facility] OR;  Service: Ophthalmology;  Laterality: Left;   PHOTOCOAGULATION WITH LASER Left 09/06/2022   Procedure: PHOTOCOAGULATION WITH LASER;  Surgeon: Carmela Rima, MD;  Location: [facility] OR;  Service: Ophthalmology;  Laterality: Left;   PHOTOCOAGULATION WITH LASER Right 11/22/2022   Procedure: PHOTOCOAGULATION WITH LASER;  Surgeon: Carmela Rima, MD;  Location: [facility] OR;  Service: Ophthalmology;  Laterality: Right;   PHOTOCOAGULATION WITH LASER Left 02/14/2023    Procedure: PHOTOCOAGULATION WITH LASER;  Surgeon: Carmela Rima, MD;  Location: [facility] OR;  Service: Ophthalmology;  Laterality: Left;   POSTERIOR CERVICAL FUSION/FORAMINOTOMY N/A 07/19/2022   Procedure: Posterior cervical fusion with lateral mass fixation - Cervical four-cervical five, Cervical five-Cervical six - Cervical six-Cervical seven - Cervical seven-Thoracic one with laminectomy;  Surgeon: Julio Sicks, MD;  [facility];  Service: Neurosurgery;  Laterality: N/A;   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   Procedure: REPAIR OF COMPLEX TRACTION RETINAL DETACHMENT;  Surgeon: Carmela Rima, MD;  Location: [facility] OR;  Service: Ophthalmology;  Laterality: Left;  MAC WITH BLOCK   REPAIR OF COMPLEX TRACTION RETINAL DETACHMENT Right 11/22/2022   Procedure: REPAIR OF COMPLEX TRACTION RETINAL DETACHMENT;  Surgeon: Carmela Rima, MD;  Location: [facility] OR;  Service: Ophthalmology;  Laterality: Right;  MAC WITH BLOCK   SILICON OIL REMOVAL Left 02/14/2023   Procedure: SILICON OIL REMOVAL;  Surgeon: Carmela Rima, MD;  Location: [facility] OR;  Service: Ophthalmology;  Laterality: Left;   TUBAL LIGATION     Past Medical History:  Diagnosis Date   Asthma    COPD (chronic obstructive pulmonary disease) (HCC)    DDD (degenerative disc disease), lumbar    Depression    Diabetes mellitus    Gastroparesis    GERD without esophagitis    High cholesterol    Hypertension    Hypothyroidism    PONV (postoperative nausea and vomiting)    PVD (peripheral vascular disease) (HCC)    Wears glasses    LMP 01/21/2012   Opioid Risk Score:   Fall Risk Score:  `1  Depression screen [facility] 2/9     12/26/2022   10:43 AM 08/15/2022    8:47 AM 05/09/2016   11:34 AM 10/14/2015    1:42 PM 09/28/2015    3:08 PM 09/14/2015   10:08 AM  Depression screen PHQ 2/9  Decreased Interest 1 3 0 0 0 0  Down, Depressed, Hopeless 1 2 0 0 0 0  PHQ - 2 Score 2 5 0 0 0 0  Altered sleeping  2      Tired, decreased energy   2      Change in appetite  3      Feeling bad or failure about yourself   0      Trouble concentrating  3      Moving slowly or fidgety/restless  0      Suicidal thoughts  0      PHQ-9 Score  15        Review of Systems     Objective:   Physical Exam        Assessment & Plan:

## 2023-02-27 ENCOUNTER — Encounter: Payer: 59 | Admitting: Registered Nurse

## 2023-03-09 ENCOUNTER — Encounter: Payer: 59 | Attending: Physical Medicine and Rehabilitation | Admitting: Registered Nurse

## 2023-03-09 VITALS — BP 121/71 | HR 86 | Ht 64.0 in | Wt 104.0 lb

## 2023-03-09 DIAGNOSIS — G959 Disease of spinal cord, unspecified: Secondary | ICD-10-CM | POA: Diagnosis not present

## 2023-03-09 DIAGNOSIS — Z5181 Encounter for therapeutic drug level monitoring: Secondary | ICD-10-CM | POA: Insufficient documentation

## 2023-03-09 DIAGNOSIS — G894 Chronic pain syndrome: Secondary | ICD-10-CM | POA: Diagnosis not present

## 2023-03-09 DIAGNOSIS — R2689 Other abnormalities of gait and mobility: Secondary | ICD-10-CM | POA: Diagnosis not present

## 2023-03-09 DIAGNOSIS — Z79899 Other long term (current) drug therapy: Secondary | ICD-10-CM | POA: Diagnosis not present

## 2023-03-09 MED ORDER — HYDROCODONE-ACETAMINOPHEN 5-325 MG PO TABS: 1.0000 | ORAL_TABLET | Freq: Two times a day (BID) | ORAL | 0 refills | Status: DC | PRN

## 2023-03-09 NOTE — Progress Notes (Signed)
Subjective:    Patient ID: Alexandria Webster, female    DOB: [birth year], 57 y.o.   MRN: [id number]  HPI: [name] is a 57 y.o. female who returns for follow up appointment for chronic pain and medication refill. She states at this time she is pain free at this time. She rates her pain 0. Her current exercise regime is walking with walker in her home. Arrived in wheelchair. .  [name] Morphine equivalent is 20.00 MME.   Oral Swab was Performed   [name] reports she is seeing a retina specialist, she has blurry vision and she is scheduled for Cataract surgery with ophthalmologist.   Daughter in room.      Pain Inventory Average Pain 6 Pain Right Now 0 My pain is intermittent, burning, stabbing, and tingling  In the last 24 hours, has pain interfered with the following? General activity 4 Relation with others 9 Enjoyment of life 7 What TIME of day is your pain at its worst? night Sleep (in general) Poor  Pain is worse with: walking and sitting Pain improves with: rest and medication Relief from Meds: 5  Family History  Problem Relation Age of Onset   Stroke Mother    Hypertension Mother    Heart failure Father    Asthma Father    Diabetes Father    Heart disease Father    Emphysema Paternal Grandfather    Colon cancer Neg Hx    Colon polyps Neg Hx    Social History   Socioeconomic History   Marital status: Single    Spouse name: Not on file   Number of children: 2   Years of education: Not on file   Highest education level: Not on file  Occupational History   Not on file  Tobacco Use   Smoking status: Former    Current packs/day: 0.00    Types: Cigarettes    Quit date: 03/14/1982    Years since quitting: 41.0    Passive exposure: Past   Smokeless tobacco: Current    Types: Snuff  Vaping Use   Vaping status: Never Used  Substance and Sexual Activity   Alcohol use: No   Drug use: No   Sexual activity: Yes    Birth control/protection:  Surgical    Comment: tubal  Other Topics Concern   Not on file  Social History Narrative   Not on file   Social Drivers of Health   Financial Resource Strain: Not on file  Food Insecurity: Not on file  Transportation Needs: Not on file  Physical Activity: Not on file  Stress: Not on file  Social Connections: Not on file   Past Surgical History:  Procedure Laterality Date   AIR/FLUID EXCHANGE Right 11/22/2022   Procedure: AIR/FLUID EXCHANGE;  Surgeon: Carmela Rima, MD;  Location: [facility] OR;  Service: Ophthalmology;  Laterality: Right;   AORTA - BILATERAL FEMORAL ARTERY BYPASS GRAFT N/A 09/27/2018   Procedure: Redo Exposure  AORTOBIFEMORAL Bypass and bilateral femoral Artery ; Redo Aortobifemoral  BYPASS GRAFT.;  Surgeon: Cephus Shelling, MD;  Location: [facility] OR;  Service: Vascular;  Laterality: N/A;   BACK SURGERY     BIOPSY  10/14/2021   Procedure: BIOPSY;  Surgeon: Lanelle Bal, DO;  [facility];  Service: Endoscopy;;   COLONOSCOPY WITH PROPOFOL N/A 10/14/2021   Procedure: COLONOSCOPY WITH PROPOFOL;  Surgeon: Lanelle Bal, DO;  [facility];  Service: Endoscopy;  Laterality: N/A;  8:30am  ESOPHAGOGASTRODUODENOSCOPY (EGD) WITH PROPOFOL N/A 10/14/2021   Procedure: ESOPHAGOGASTRODUODENOSCOPY (EGD) WITH PROPOFOL;  Surgeon: Lanelle Bal, DO;  [facility];  Service: Endoscopy;  Laterality: N/A;   FEMORAL ARTERY STENT  right leg   INJECTION OF SILICONE OIL Left 09/06/2022   Procedure: INJECTION OF SILICONE OIL;  Surgeon: Carmela Rima, MD;  Location: [facility] OR;  Service: Ophthalmology;  Laterality: Left;   INJECTION OF SILICONE OIL Right 11/22/2022   Procedure: INJECTION OF SILICONE OIL;  Surgeon: Carmela Rima, MD;  Location: [facility] OR;  Service: Ophthalmology;  Laterality: Right;   MEMBRANE PEEL Left 09/06/2022   Procedure: MEMBRANECTOMY;  Surgeon: Carmela Rima, MD;  Location: [facility] OR;  Service: Ophthalmology;  Laterality: Left;   PARS  PLANA VITRECTOMY Left 09/06/2022   Procedure: PARS PLANA VITRECTOMY WITH 25 GAUGE;  Surgeon: Carmela Rima, MD;  Location: [facility] OR;  Service: Ophthalmology;  Laterality: Left;   PARS PLANA VITRECTOMY Left 02/14/2023   Procedure: PARS PLANA VITRECTOMY WITH 25 GAUGE;  Surgeon: Carmela Rima, MD;  Location: [facility] OR;  Service: Ophthalmology;  Laterality: Left;   PHOTOCOAGULATION WITH LASER Left 09/06/2022   Procedure: PHOTOCOAGULATION WITH LASER;  Surgeon: Carmela Rima, MD;  Location: [facility] OR;  Service: Ophthalmology;  Laterality: Left;   PHOTOCOAGULATION WITH LASER Right 11/22/2022   Procedure: PHOTOCOAGULATION WITH LASER;  Surgeon: Carmela Rima, MD;  Location: [facility] OR;  Service: Ophthalmology;  Laterality: Right;   PHOTOCOAGULATION WITH LASER Left 02/14/2023   Procedure: PHOTOCOAGULATION WITH LASER;  Surgeon: Carmela Rima, MD;  Location: [facility] OR;  Service: Ophthalmology;  Laterality: Left;   POSTERIOR CERVICAL FUSION/FORAMINOTOMY N/A 07/19/2022   Procedure: Posterior cervical fusion with lateral mass fixation - Cervical four-cervical five, Cervical five-Cervical six - Cervical six-Cervical seven - Cervical seven-Thoracic one with laminectomy;  Surgeon: Julio Sicks, MD;  [facility];  Service: Neurosurgery;  Laterality: N/A;   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   Procedure: REPAIR OF COMPLEX TRACTION RETINAL DETACHMENT;  Surgeon: Carmela Rima, MD;  Location: [facility] OR;  Service: Ophthalmology;  Laterality: Left;  MAC WITH BLOCK   REPAIR OF COMPLEX TRACTION RETINAL DETACHMENT Right 11/22/2022   Procedure: REPAIR OF COMPLEX TRACTION RETINAL DETACHMENT;  Surgeon: Carmela Rima, MD;  Location: [facility] OR;  Service: Ophthalmology;  Laterality: Right;  MAC WITH BLOCK   SILICON OIL REMOVAL Left 02/14/2023   Procedure: SILICON OIL REMOVAL;  Surgeon: Carmela Rima, MD;  Location: [facility] OR;  Service: Ophthalmology;  Laterality: Left;   TUBAL LIGATION     Past Surgical History:  Procedure  Laterality Date   AIR/FLUID EXCHANGE Right 11/22/2022   Procedure: AIR/FLUID EXCHANGE;  Surgeon: Carmela Rima, MD;  Location: [facility] OR;  Service: Ophthalmology;  Laterality: Right;   AORTA - BILATERAL FEMORAL ARTERY BYPASS GRAFT N/A 09/27/2018   Procedure: Redo Exposure  AORTOBIFEMORAL Bypass and bilateral femoral Artery ; Redo Aortobifemoral  BYPASS GRAFT.;  Surgeon: Cephus Shelling, MD;  Location: [facility] OR;  Service: Vascular;  Laterality: N/A;   BACK SURGERY     BIOPSY  10/14/2021   Procedure: BIOPSY;  Surgeon: Lanelle Bal, DO;  [facility];  Service: Endoscopy;;   COLONOSCOPY WITH PROPOFOL N/A 10/14/2021   Procedure: COLONOSCOPY WITH PROPOFOL;  Surgeon: Lanelle Bal, DO;  [facility];  Service: Endoscopy;  Laterality: N/A;  8:30am   ESOPHAGOGASTRODUODENOSCOPY (EGD) WITH PROPOFOL N/A 10/14/2021   Procedure: ESOPHAGOGASTRODUODENOSCOPY (EGD) WITH PROPOFOL;  Surgeon: Lanelle Bal, DO;  [facility];  Service: Endoscopy;  Laterality: N/A;   FEMORAL ARTERY STENT  right leg   INJECTION OF SILICONE OIL Left 09/06/2022   Procedure: INJECTION OF SILICONE OIL;  Surgeon: Carmela Rima, MD;  Location: [facility] OR;  Service: Ophthalmology;  Laterality: Left;   INJECTION OF SILICONE OIL Right 11/22/2022   Procedure: INJECTION OF SILICONE OIL;  Surgeon: Carmela Rima, MD;  Location: [facility] OR;  Service: Ophthalmology;  Laterality: Right;   MEMBRANE PEEL Left 09/06/2022   Procedure: MEMBRANECTOMY;  Surgeon: Carmela Rima, MD;  Location: [facility] OR;  Service: Ophthalmology;  Laterality: Left;   PARS PLANA VITRECTOMY Left 09/06/2022   Procedure: PARS PLANA VITRECTOMY WITH 25 GAUGE;  Surgeon: Carmela Rima, MD;  Location: [facility] OR;  Service: Ophthalmology;  Laterality: Left;   PARS PLANA VITRECTOMY Left 02/14/2023   Procedure: PARS PLANA VITRECTOMY WITH 25 GAUGE;  Surgeon: Carmela Rima, MD;  Location: [facility] OR;  Service: Ophthalmology;  Laterality: Left;   PHOTOCOAGULATION  WITH LASER Left 09/06/2022   Procedure: PHOTOCOAGULATION WITH LASER;  Surgeon: Carmela Rima, MD;  Location: [facility] OR;  Service: Ophthalmology;  Laterality: Left;   PHOTOCOAGULATION WITH LASER Right 11/22/2022   Procedure: PHOTOCOAGULATION WITH LASER;  Surgeon: Carmela Rima, MD;  Location: [facility] OR;  Service: Ophthalmology;  Laterality: Right;   PHOTOCOAGULATION WITH LASER Left 02/14/2023   Procedure: PHOTOCOAGULATION WITH LASER;  Surgeon: Carmela Rima, MD;  Location: [facility] OR;  Service: Ophthalmology;  Laterality: Left;   POSTERIOR CERVICAL FUSION/FORAMINOTOMY N/A 07/19/2022   Procedure: Posterior cervical fusion with lateral mass fixation - Cervical four-cervical five, Cervical five-Cervical six - Cervical six-Cervical seven - Cervical seven-Thoracic one with laminectomy;  Surgeon: Julio Sicks, MD;  [facility];  Service: Neurosurgery;  Laterality: N/A;   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   Procedure: REPAIR OF COMPLEX TRACTION RETINAL DETACHMENT;  Surgeon: Carmela Rima, MD;  Location: [facility] OR;  Service: Ophthalmology;  Laterality: Left;  MAC WITH BLOCK   REPAIR OF COMPLEX TRACTION RETINAL DETACHMENT Right 11/22/2022   Procedure: REPAIR OF COMPLEX TRACTION RETINAL DETACHMENT;  Surgeon: Carmela Rima, MD;  Location: [facility] OR;  Service: Ophthalmology;  Laterality: Right;  MAC WITH BLOCK   SILICON OIL REMOVAL Left 02/14/2023   Procedure: SILICON OIL REMOVAL;  Surgeon: Carmela Rima, MD;  Location: [facility] OR;  Service: Ophthalmology;  Laterality: Left;   TUBAL LIGATION     Past Medical History:  Diagnosis Date   Asthma    COPD (chronic obstructive pulmonary disease) (HCC)    DDD (degenerative disc disease), lumbar    Depression    Diabetes mellitus    Gastroparesis    GERD without esophagitis    High cholesterol    Hypertension    Hypothyroidism    PONV (postoperative nausea and vomiting)    PVD (peripheral vascular disease) (HCC)    Wears glasses    LMP 01/21/2012    Opioid Risk Score:   Fall Risk Score:  `1  Depression screen [facility] 2/9     12/26/2022   10:43 AM 08/15/2022    8:47 AM 05/09/2016   11:34 AM 10/14/2015    1:42 PM 09/28/2015    3:08 PM 09/14/2015   10:08 AM  Depression screen PHQ 2/9  Decreased Interest 1 3 0 0 0 0  Down, Depressed, Hopeless 1 2 0 0 0 0  PHQ - 2 Score 2 5 0 0 0 0  Altered sleeping  2      Tired, decreased energy  2      Change in appetite  3      Feeling bad or failure about yourself   0      Trouble concentrating  3      Moving slowly or fidgety/restless  0      Suicidal thoughts  0      PHQ-9 Score  15          Review of Systems  Musculoskeletal:  Positive for arthralgias and gait problem.       Left knee and shoulder  All other systems reviewed and are negative.      Objective:   Physical Exam Vitals and nursing note reviewed.  Constitutional:      Appearance: Normal appearance.  Cardiovascular:     Rate and Rhythm: Normal rate and regular rhythm.     Pulses: Normal pulses.     Heart sounds: Normal heart sounds.  Pulmonary:     Effort: Pulmonary effort is normal.     Breath sounds: Normal breath sounds.  Musculoskeletal:     Comments: Normal Muscle Bulk and Muscle Testing Reveals:  Upper Extremities: Right: Full ROM and Muscle Strength 5/5 Left Lower Extremity: Decreased ROM 90 Degrees and Muscle Strength 4/5 Left AC Joint Tenderness Lumbar Paraspinal Tenderness: L-3-L-5 Mainly Right Side  Lower Extremities : Full ROM and Muscle Strength 5/5 Arrived in wheelchair      Skin:    General: Skin is warm and dry.  Neurological:     Mental Status: She is alert and oriented to person, place, and time.  Psychiatric:        Mood and Affect: Mood normal.        Behavior: Behavior normal.         Assessment & Plan:  Cervical Myelopathy: Continue HEP as Tolerated. Continue to Monitor.  Impaired Gait and Mobility: Use walker at all times . Continue to monitor.  Chronic Pain Sundrome: Refilled:  Hydrocodone 5-325 mg one tablet twice a day as needed for pain #60. We will continue the opioid monitoring program, this consists of regular clinic visits, examinations, urine drug screen, pill counts as well as use of [place] Controlled Substance Reporting system. A 12 month History has been reviewed on the [place] Controlled Substance Reporting System on 03/09/2023.   F/U with Dr Berline Chough in 4-6 weeks

## 2023-03-10 DIAGNOSIS — E113513 Type 2 diabetes mellitus with proliferative diabetic retinopathy with macular edema, bilateral: Secondary | ICD-10-CM | POA: Diagnosis not present

## 2023-03-10 DIAGNOSIS — E1136 Type 2 diabetes mellitus with diabetic cataract: Secondary | ICD-10-CM | POA: Diagnosis not present

## 2023-03-10 DIAGNOSIS — E113512 Type 2 diabetes mellitus with proliferative diabetic retinopathy with macular edema, left eye: Secondary | ICD-10-CM | POA: Diagnosis not present

## 2023-03-11 ENCOUNTER — Other Ambulatory Visit: Payer: Self-pay | Admitting: Gastroenterology

## 2023-03-11 ENCOUNTER — Encounter: Payer: Self-pay | Admitting: Registered Nurse

## 2023-03-13 LAB — DRUG TOX MONITOR 1 W/CONF, ORAL FLD
Amphetamines: NEGATIVE ng/mL (ref <10)
Barbiturates: NEGATIVE ng/mL (ref <10)
Benzodiazepines: NEGATIVE ng/mL (ref <0.50)
Buprenorphine: NEGATIVE ng/mL (ref <0.10)
Cocaine: NEGATIVE ng/mL (ref <5.0)
Codeine: NEGATIVE ng/mL (ref <2.5)
Cotinine: 45.3 ng/mL — ABNORMAL HIGH (ref <5.0)
Dihydrocodeine: NEGATIVE ng/mL (ref <2.5)
Fentanyl: NEGATIVE ng/mL (ref <0.10)
Heroin Metabolite: NEGATIVE ng/mL (ref <1.0)
Hydrocodone: 9.3 ng/mL — ABNORMAL HIGH (ref <2.5)
Hydromorphone: NEGATIVE ng/mL (ref <2.5)
MARIJUANA: NEGATIVE ng/mL (ref <2.5)
MDMA: NEGATIVE ng/mL (ref <10)
Meprobamate: NEGATIVE ng/mL (ref <2.5)
Methadone: NEGATIVE ng/mL (ref <5.0)
Morphine: NEGATIVE ng/mL (ref <2.5)
Nicotine Metabolite: POSITIVE ng/mL — AB (ref <5.0)
Norhydrocodone: NEGATIVE ng/mL (ref <2.5)
Noroxycodone: NEGATIVE ng/mL (ref <2.5)
Opiates: POSITIVE ng/mL — AB (ref <2.5)
Oxycodone: NEGATIVE ng/mL (ref <2.5)
Oxymorphone: NEGATIVE ng/mL (ref <2.5)
Phencyclidine: NEGATIVE ng/mL (ref <10)
Tapentadol: NEGATIVE ng/mL (ref <5.0)
Tramadol: NEGATIVE ng/mL (ref <5.0)
Zolpidem: NEGATIVE ng/mL (ref <5.0)

## 2023-03-13 LAB — DRUG TOX ALC METAB W/CON, ORAL FLD: Alcohol Metabolite: NEGATIVE ng/mL (ref <25)

## 2023-03-15 DIAGNOSIS — Z419 Encounter for procedure for purposes other than remedying health state, unspecified: Secondary | ICD-10-CM | POA: Diagnosis not present

## 2023-03-16 DIAGNOSIS — Z0001 Encounter for general adult medical examination with abnormal findings: Secondary | ICD-10-CM | POA: Diagnosis not present

## 2023-03-16 DIAGNOSIS — Z993 Dependence on wheelchair: Secondary | ICD-10-CM | POA: Diagnosis not present

## 2023-03-16 DIAGNOSIS — Z6821 Body mass index (BMI) 21.0-21.9, adult: Secondary | ICD-10-CM | POA: Diagnosis not present

## 2023-03-16 DIAGNOSIS — F5101 Primary insomnia: Secondary | ICD-10-CM | POA: Diagnosis not present

## 2023-03-16 DIAGNOSIS — M5030 Other cervical disc degeneration, unspecified cervical region: Secondary | ICD-10-CM | POA: Diagnosis not present

## 2023-03-16 DIAGNOSIS — E039 Hypothyroidism, unspecified: Secondary | ICD-10-CM | POA: Diagnosis not present

## 2023-03-16 DIAGNOSIS — E782 Mixed hyperlipidemia: Secondary | ICD-10-CM | POA: Diagnosis not present

## 2023-03-16 DIAGNOSIS — E1159 Type 2 diabetes mellitus with other circulatory complications: Secondary | ICD-10-CM | POA: Diagnosis not present

## 2023-03-16 DIAGNOSIS — I10 Essential (primary) hypertension: Secondary | ICD-10-CM | POA: Diagnosis not present

## 2023-03-16 DIAGNOSIS — J3089 Other allergic rhinitis: Secondary | ICD-10-CM | POA: Diagnosis not present

## 2023-03-29 ENCOUNTER — Ambulatory Visit: Payer: 59 | Admitting: Gastroenterology

## 2023-04-06 ENCOUNTER — Telehealth: Payer: Self-pay | Admitting: *Deleted

## 2023-04-06 ENCOUNTER — Other Ambulatory Visit (HOSPITAL_COMMUNITY): Payer: Self-pay

## 2023-04-06 ENCOUNTER — Telehealth: Payer: Self-pay

## 2023-04-06 NOTE — Telephone Encounter (Signed)
I see where it went to the PA team through onbase. The pharmacy didn't send it until Friday afternoon. And not sure if PA team worked Monday since it was a holiday.

## 2023-04-06 NOTE — Telephone Encounter (Signed)
Pharmacy Patient Advocate Encounter   Received notification from Pt Calls Messages that prior authorization for Trulicity is required/requested.   Insurance verification completed.   The patient is insured through [facility] .   Per test claim: PA required; PA submitted to above mentioned insurance via CoverMyMeds Key/confirmation #/EOC [id number] Status is pending

## 2023-04-06 NOTE — Telephone Encounter (Signed)
Patient's daughter left a voicemail. She says that [facility] told them they had send two request to [place] for a PA for the patient's Trulicity. Patient needs the medication ASAP as she is out. Sending to PA team, to see if they have seen it and or currently working on it. There is a copy of the patient's insurance card in notes,04/04/23.

## 2023-04-06 NOTE — Telephone Encounter (Signed)
Patient's daughter was called and made aware. 

## 2023-04-06 NOTE — Telephone Encounter (Signed)
Noted  

## 2023-04-06 NOTE — Telephone Encounter (Signed)
PA submitted for Hydrocodone-acetaminophen  Chenay Cordon (Key: [id number])

## 2023-04-07 NOTE — Telephone Encounter (Signed)
Pharmacy Patient Advocate Encounter  Received notification from [facility] that Prior Authorization for Trulicity has been APPROVED through 04/05/2024   PA #/Case ID/Reference #: [id number]

## 2023-04-10 NOTE — Telephone Encounter (Signed)
Called and left a message on her daughter, Toney Reil, voicemail. Letting her know that the Trulicity had been approved.

## 2023-04-12 ENCOUNTER — Encounter (HOSPITAL_COMMUNITY): Payer: Self-pay | Admitting: Ophthalmology

## 2023-04-12 NOTE — Progress Notes (Addendum)
SDW CALL  Patient was given pre-op instructions over the phone. The opportunity was given for the patient to ask questions. No further questions asked. Patient verbalized understanding of instructions given.   PCP - Dr. Assunta Found Cardiologist - denies  PPM/ICD - denies Device Orders - n/a Rep Notified - n/a  Chest x-ray - 07/25/22 - 1 view EKG - 07/15/22 Stress Test - denies ECHO - 08/07/22 Cardiac Cath - denies  Sleep Study - denies CPAP - n/a  Fasting Blood Sugar - 98 Checks Blood Sugar __1___ times a day  Last dose of GLP1 agonist-  Trulicity - last dose was 04/06/23 GLP1 instructions: patient instructed not to take dose on Thursday  Blood Thinner Instructions: n/a Aspirin Instructions: per patient last dose of Aspirin was 1/28 and she is not taking today or tomorrow - spoke with Ginger at Dr. Eliane Decree office who stated should would notify Dr. Allena Katz but should not be an issue.    ERAS Protcol - clears until 1230 PRE-SURGERY Ensure or G2- n/a  COVID TEST- n/a   Anesthesia review: yes - GLP1 - sent to Antionette Poles on 1/29  Patient denies shortness of breath, fever, cough and chest pain over the phone call   All instructions explained to the patient, with a verbal understanding of the material. Patient agrees to go over the instructions while at home for a better understanding.

## 2023-04-13 ENCOUNTER — Ambulatory Visit (HOSPITAL_COMMUNITY): Payer: Self-pay | Admitting: Anesthesiology

## 2023-04-13 ENCOUNTER — Ambulatory Visit (HOSPITAL_COMMUNITY)
Admission: RE | Admit: 2023-04-13 | Discharge: 2023-04-13 | Disposition: A | Discharge status: Home / Self Care | Payer: Medicaid Other | Attending: Ophthalmology | Admitting: Ophthalmology

## 2023-04-13 ENCOUNTER — Encounter (HOSPITAL_COMMUNITY)
Admission: RE | Disposition: A | Discharge status: Home / Self Care | Payer: Self-pay | Source: Home / Self Care | Attending: Ophthalmology

## 2023-04-13 ENCOUNTER — Ambulatory Visit (HOSPITAL_COMMUNITY): Payer: Medicaid Other

## 2023-04-13 ENCOUNTER — Encounter (HOSPITAL_COMMUNITY): Payer: Self-pay | Admitting: Ophthalmology

## 2023-04-13 ENCOUNTER — Other Ambulatory Visit: Payer: Self-pay

## 2023-04-13 DIAGNOSIS — J4489 Other specified chronic obstructive pulmonary disease: Secondary | ICD-10-CM | POA: Diagnosis not present

## 2023-04-13 DIAGNOSIS — Z794 Long term (current) use of insulin: Secondary | ICD-10-CM | POA: Insufficient documentation

## 2023-04-13 DIAGNOSIS — K3184 Gastroparesis: Secondary | ICD-10-CM | POA: Diagnosis not present

## 2023-04-13 DIAGNOSIS — E039 Hypothyroidism, unspecified: Secondary | ICD-10-CM | POA: Diagnosis not present

## 2023-04-13 DIAGNOSIS — Z87891 Personal history of nicotine dependence: Secondary | ICD-10-CM | POA: Insufficient documentation

## 2023-04-13 DIAGNOSIS — E1143 Type 2 diabetes mellitus with diabetic autonomic (poly)neuropathy: Secondary | ICD-10-CM | POA: Diagnosis not present

## 2023-04-13 DIAGNOSIS — Z9849 Cataract extraction status, unspecified eye: Secondary | ICD-10-CM | POA: Diagnosis not present

## 2023-04-13 DIAGNOSIS — J449 Chronic obstructive pulmonary disease, unspecified: Secondary | ICD-10-CM

## 2023-04-13 DIAGNOSIS — I2510 Atherosclerotic heart disease of native coronary artery without angina pectoris: Secondary | ICD-10-CM | POA: Diagnosis not present

## 2023-04-13 DIAGNOSIS — I10 Essential (primary) hypertension: Secondary | ICD-10-CM

## 2023-04-13 DIAGNOSIS — E1151 Type 2 diabetes mellitus with diabetic peripheral angiopathy without gangrene: Secondary | ICD-10-CM | POA: Insufficient documentation

## 2023-04-13 DIAGNOSIS — H269 Unspecified cataract: Secondary | ICD-10-CM

## 2023-04-13 DIAGNOSIS — H259 Unspecified age-related cataract: Secondary | ICD-10-CM | POA: Diagnosis not present

## 2023-04-13 DIAGNOSIS — E1136 Type 2 diabetes mellitus with diabetic cataract: Secondary | ICD-10-CM | POA: Diagnosis not present

## 2023-04-13 HISTORY — PX: CATARACT EXTRACTION EXTRACAPSULAR: SHX1305

## 2023-04-13 LAB — COMPREHENSIVE METABOLIC PANEL
ALT: 14 U/L (ref 0–44)
AST: 18 U/L (ref 15–41)
Albumin: 3.7 g/dL (ref 3.5–5.0)
Alkaline Phosphatase: 46 U/L (ref 38–126)
Anion gap: 11 (ref 5–15)
BUN: 20 mg/dL (ref 6–20)
CO2: 24 mmol/L (ref 22–32)
Calcium: 9.7 mg/dL (ref 8.9–10.3)
Chloride: 104 mmol/L (ref 98–111)
Creatinine, Ser: 0.81 mg/dL (ref 0.44–1.00)
GFR, Estimated: >60 mL/min (ref >60)
Glucose, Bld: 178 mg/dL — ABNORMAL HIGH (ref 70–99)
Potassium: 4 mmol/L (ref 3.5–5.1)
Sodium: 139 mmol/L (ref 135–145)
Total Bilirubin: 0.8 mg/dL (ref 0.0–1.2)
Total Protein: 6.6 g/dL (ref 6.5–8.1)

## 2023-04-13 LAB — CBC
HCT: 38.9 % (ref 36.0–46.0)
Hemoglobin: 13.8 g/dL (ref 12.0–15.0)
MCH: 34.2 pg — ABNORMAL HIGH (ref 26.0–34.0)
MCHC: 35.5 g/dL (ref 30.0–36.0)
MCV: 96.5 fL (ref 80.0–100.0)
Platelets: 168 10*3/uL (ref 150–400)
RBC: 4.03 MIL/uL (ref 3.87–5.11)
RDW: 12.2 % (ref 11.5–15.5)
WBC: 6.9 10*3/uL (ref 4.0–10.5)
nRBC: 0 % (ref 0.0–0.2)

## 2023-04-13 LAB — GLUCOSE, CAPILLARY
Glucose-Capillary: 184 mg/dL — ABNORMAL HIGH (ref 70–99)
Glucose-Capillary: 98 mg/dL (ref 70–99)
Glucose-Capillary: 83 mg/dL (ref 70–99)
Glucose-Capillary: 102 mg/dL — ABNORMAL HIGH (ref 70–99)

## 2023-04-13 SURGERY — EXTRACTION, CATARACT, WITH IOL INSERTION
Anesthesia: Monitor Anesthesia Care | Site: Eye | Laterality: Left

## 2023-04-13 MED ORDER — NA CHONDROIT SULF-NA HYALURON 40-30 MG/ML IO SOSY
INTRAOCULAR | Status: AC
  Filled 2023-04-13: qty 0.5

## 2023-04-13 MED ORDER — INSULIN ASPART 100 UNIT/ML IJ SOLN
0.0000 [IU] | INTRAMUSCULAR | Status: DC | PRN
  Administered 2023-04-13: 2 [IU] via SUBCUTANEOUS

## 2023-04-13 MED ORDER — CEFAZOLIN SUBCONJUNCTIVAL INJECTION 100 MG/0.5 ML
100.0000 mg | INJECTION | SUBCONJUNCTIVAL | Status: AC
  Administered 2023-04-13: 100 mg via SUBCONJUNCTIVAL
  Filled 2023-04-13: qty 1
  Filled 2023-04-13: qty 5

## 2023-04-13 MED ORDER — LIDOCAINE HCL 2 % IJ SOLN
INTRAMUSCULAR | Status: AC
  Filled 2023-04-13: qty 20

## 2023-04-13 MED ORDER — OFLOXACIN 0.3 % OP SOLN
1.0000 [drp] | OPHTHALMIC | Status: AC | PRN
  Administered 2023-04-13: 1 [drp] via OPHTHALMIC
  Filled 2023-04-13: qty 5

## 2023-04-13 MED ORDER — CHLORHEXIDINE GLUCONATE 0.12 % MT SOLN
OROMUCOSAL | Status: AC
  Filled 2023-04-13: qty 15

## 2023-04-13 MED ORDER — TOBRAMYCIN-DEXAMETHASONE 0.3-0.1 % OP OINT
TOPICAL_OINTMENT | OPHTHALMIC | Status: AC
  Filled 2023-04-13: qty 3.5

## 2023-04-13 MED ORDER — MEPERIDINE HCL 25 MG/ML IJ SOLN: 6.2500 mg | INTRAMUSCULAR | Status: DC | PRN

## 2023-04-13 MED ORDER — OXYCODONE HCL 5 MG PO TABS: 5.0000 mg | ORAL_TABLET | Freq: Once | ORAL | Status: DC | PRN

## 2023-04-13 MED ORDER — EPINEPHRINE PF 1 MG/ML IJ SOLN
INTRAOCULAR | Status: DC | PRN
  Administered 2023-04-13: 500.3 mL

## 2023-04-13 MED ORDER — FENTANYL CITRATE (PF) 100 MCG/2ML IJ SOLN: 25.0000 ug | INTRAMUSCULAR | Status: DC | PRN

## 2023-04-13 MED ORDER — LIDOCAINE HCL (PF) 2 % IJ SOLN
INTRAMUSCULAR | Status: DC | PRN
  Administered 2023-04-13: 5 mL

## 2023-04-13 MED ORDER — DEXAMETHASONE SODIUM PHOSPHATE 10 MG/ML IJ SOLN
INTRAMUSCULAR | Status: DC | PRN
  Administered 2023-04-13: 10 mg

## 2023-04-13 MED ORDER — OXYCODONE HCL 5 MG/5ML PO SOLN: 5.0000 mg | Freq: Once | ORAL | Status: DC | PRN

## 2023-04-13 MED ORDER — ACETAMINOPHEN 325 MG PO TABS: 325.0000 mg | ORAL_TABLET | ORAL | Status: DC | PRN

## 2023-04-13 MED ORDER — ONDANSETRON HCL 4 MG/2ML IJ SOLN: 4.0000 mg | Freq: Once | INTRAMUSCULAR | Status: DC | PRN

## 2023-04-13 MED ORDER — BSS PLUS IO SOLN
INTRAOCULAR | Status: DC | PRN
  Administered 2023-04-13 (×2): 1 via INTRAOCULAR

## 2023-04-13 MED ORDER — BUPIVACAINE HCL (PF) 0.75 % IJ SOLN
INTRAMUSCULAR | Status: AC
  Filled 2023-04-13: qty 10

## 2023-04-13 MED ORDER — BSS IO SOLN
INTRAOCULAR | Status: AC
  Filled 2023-04-13: qty 15

## 2023-04-13 MED ORDER — BSS PLUS IO SOLN
INTRAOCULAR | Status: AC
  Filled 2023-04-13: qty 500

## 2023-04-13 MED ORDER — SODIUM CHLORIDE 0.9 % IV SOLN: INTRAVENOUS | Status: DC

## 2023-04-13 MED ORDER — SODIUM CHLORIDE 0.9% FLUSH
3.0000 mL | INTRAVENOUS | Status: DC | PRN
Start: 2023-04-13 — End: 2023-04-14

## 2023-04-13 MED ORDER — FENTANYL CITRATE (PF) 250 MCG/5ML IJ SOLN
INTRAMUSCULAR | Status: AC
  Filled 2023-04-13: qty 5

## 2023-04-13 MED ORDER — EPINEPHRINE PF 1 MG/ML IJ SOLN
INTRAMUSCULAR | Status: AC
  Filled 2023-04-13: qty 1

## 2023-04-13 MED ORDER — MIDAZOLAM HCL 2 MG/2ML IJ SOLN
INTRAMUSCULAR | Status: AC
  Filled 2023-04-13: qty 2

## 2023-04-13 MED ORDER — TROPICAMIDE 1 % OP SOLN
1.0000 [drp] | OPHTHALMIC | Status: AC | PRN
Start: 2023-04-13 — End: 2023-04-13
  Administered 2023-04-13 (×3): 1 [drp] via OPHTHALMIC
  Filled 2023-04-13: qty 15

## 2023-04-13 MED ORDER — SODIUM CHLORIDE 0.9% FLUSH: 3.0000 mL | Freq: Two times a day (BID) | INTRAVENOUS | Status: DC

## 2023-04-13 MED ORDER — STERILE WATER FOR IRRIGATION IR SOLN
Status: DC | PRN
  Administered 2023-04-13: 300 mL

## 2023-04-13 MED ORDER — NA CHONDROIT SULF-NA HYALURON 40-30 MG/ML IO SOSY
INTRAOCULAR | Status: DC | PRN
  Administered 2023-04-13: .5 mL via INTRAOCULAR

## 2023-04-13 MED ORDER — SODIUM HYALURONATE 10 MG/ML IO SOLUTION
PREFILLED_SYRINGE | INTRAOCULAR | Status: AC
  Filled 2023-04-13: qty 0.85

## 2023-04-13 MED ORDER — DEXAMETHASONE SODIUM PHOSPHATE 10 MG/ML IJ SOLN
INTRAMUSCULAR | Status: AC
  Filled 2023-04-13: qty 1

## 2023-04-13 MED ORDER — TETRACAINE HCL 0.5 % OP SOLN
OPHTHALMIC | Status: AC
  Filled 2023-04-13: qty 4

## 2023-04-13 MED ORDER — PROPARACAINE HCL 0.5 % OP SOLN
1.0000 [drp] | OPHTHALMIC | Status: AC | PRN
  Administered 2023-04-13: 1 [drp] via OPHTHALMIC
  Filled 2023-04-13: qty 15

## 2023-04-13 MED ORDER — PROPOFOL 10 MG/ML IV BOLUS
INTRAVENOUS | Status: AC
  Filled 2023-04-13: qty 20

## 2023-04-13 MED ORDER — HYALURONIDASE HUMAN 150 UNIT/ML IJ SOLN
INTRAMUSCULAR | Status: AC
  Filled 2023-04-13: qty 1

## 2023-04-13 MED ORDER — INSULIN ASPART 100 UNIT/ML IJ SOLN
INTRAMUSCULAR | Status: AC
  Filled 2023-04-13: qty 1

## 2023-04-13 MED ORDER — PROPOFOL 10 MG/ML IV BOLUS
INTRAVENOUS | Status: DC | PRN
  Administered 2023-04-13: 30 mg via INTRAVENOUS

## 2023-04-13 MED ORDER — ACETAMINOPHEN 160 MG/5ML PO SOLN: 325.0000 mg | ORAL | Status: DC | PRN

## 2023-04-13 MED ORDER — CYCLOPENTOLATE HCL 1 % OP SOLN
1.0000 [drp] | OPHTHALMIC | Status: AC | PRN
  Administered 2023-04-13 (×3): 1 [drp] via OPHTHALMIC
  Filled 2023-04-13: qty 2

## 2023-04-13 MED ORDER — PHENYLEPHRINE HCL 10 % OP SOLN
1.0000 [drp] | OPHTHALMIC | Status: AC | PRN
  Administered 2023-04-13: 1 [drp] via OPHTHALMIC
  Filled 2023-04-13: qty 5

## 2023-04-13 MED ORDER — SIGHTPATH DOSE#1 SODIUM HYALURONATE 10 MG/ML IO SOLUTION
PREFILLED_SYRINGE | INTRAOCULAR | Status: DC | PRN
  Administered 2023-04-13: .85 mL via INTRAOCULAR

## 2023-04-13 MED ORDER — TOBRAMYCIN-DEXAMETHASONE 0.3-0.1 % OP OINT
TOPICAL_OINTMENT | OPHTHALMIC | Status: DC | PRN
  Administered 2023-04-13: 1 via OPHTHALMIC

## 2023-04-13 MED ORDER — ATROPINE SULFATE 1 % OP SOLN
OPHTHALMIC | Status: AC
Start: 2023-04-13 — End: ?
  Filled 2023-04-13: qty 5

## 2023-04-13 SURGICAL SUPPLY — 65 items
APPLICATOR COTTON TIP 6 STRL (MISCELLANEOUS) ×2 IMPLANT
APPLICATOR COTTON TIP 6IN STRL (MISCELLANEOUS) ×1 IMPLANT
BAND WRIST GAS GREEN (MISCELLANEOUS) IMPLANT
BLADE EYE SLIT 3.0 45D BEAV (BLADE) ×2 IMPLANT
BLADE KERATOME 2.75 (BLADE) ×2 IMPLANT
BLADE MVR KNIFE 20G (BLADE) IMPLANT
BLADE STAB KNIFE 15DEG (BLADE) ×2 IMPLANT
BNDG EYE OVAL 2 1/8 X 2 5/8 (GAUZE/BANDAGES/DRESSINGS) IMPLANT
CANN HYDRODISSECT NUC 25X7/8 (MISCELLANEOUS) ×1 IMPLANT
CANNULA ANT CHAM MAIN (OPHTHALMIC RELATED) IMPLANT
CANNULA ANT/CHMB 27G (MISCELLANEOUS) IMPLANT
CANNULA ANT/CHMB 27GA (MISCELLANEOUS) ×1 IMPLANT
CANNULA DUAL BORE 23G (CANNULA) IMPLANT
CANNULA DUALBORE 25G (CANNULA) IMPLANT
CANNULA HYDRODISSEC NUC 25X7/8 (MISCELLANEOUS) IMPLANT
CANNULA VLV SOFT TIP 25G (OPHTHALMIC) ×2 IMPLANT
CANNULA VLV SOFT TIP 25GA (OPHTHALMIC) ×1 IMPLANT
CARTRIDGE A MONARCH (MISCELLANEOUS) IMPLANT
CARTRIDGE C MONARCH III (MISCELLANEOUS) IMPLANT
CAUTERY EYE LOW TEMP 1300F FIN (OPHTHALMIC RELATED) IMPLANT
CLSR STERI-STRIP ANTIMIC 1/2X4 (GAUZE/BANDAGES/DRESSINGS) ×2 IMPLANT
COVER MAYO STAND STRL (DRAPES) IMPLANT
DRAPE INCISE 51X51 W/FILM STRL (DRAPES) IMPLANT
DRAPE RETRACTOR (MISCELLANEOUS) ×2 IMPLANT
DRAPE SHEET LG 3/4 BI-LAMINATE (DRAPES) ×2 IMPLANT
GLOVE SURG SYN 7.5 E (GLOVE) ×1 IMPLANT
GLOVE SURG SYN 7.5 PF PI (GLOVE) ×2 IMPLANT
GOWN STRL REUS W/ TWL LRG LVL3 (GOWN DISPOSABLE) ×2 IMPLANT
KIT BASIN OR (CUSTOM PROCEDURE TRAY) ×2 IMPLANT
KIT TURNOVER KIT B (KITS) ×2 IMPLANT
KNIFE CRESCENT 1.75 EDGEAHEAD (BLADE) ×2 IMPLANT
LENS IOL ACRSF IQ PC 24.0 (Intraocular Lens) IMPLANT
LENS IOL ACRYSOF IQ POST 24.0 (Intraocular Lens) ×1 IMPLANT
NDL 18GX1X1/2 (RX/OR ONLY) (NEEDLE) ×2 IMPLANT
NDL 25GX 5/8IN NON SAFETY (NEEDLE) ×2 IMPLANT
NDL 27GX1/2 REG BEVEL ECLIP (NEEDLE) ×2 IMPLANT
NDL FILTER BLUNT 18X1 1/2 (NEEDLE) ×2 IMPLANT
NDL HYPO 25GX1X1/2 BEV (NEEDLE) IMPLANT
NDL HYPO 30X.5 LL (NEEDLE) ×4 IMPLANT
NDL RETROBULBAR 25GX1.5 (NEEDLE) ×2 IMPLANT
NEEDLE 18GX1X1/2 (RX/OR ONLY) (NEEDLE) ×1 IMPLANT
NEEDLE 25GX 5/8IN NON SAFETY (NEEDLE) IMPLANT
NEEDLE 27GX1/2 REG BEVEL ECLIP (NEEDLE) IMPLANT
NEEDLE FILTER BLUNT 18X1 1/2 (NEEDLE) ×1 IMPLANT
NEEDLE HYPO 25GX1X1/2 BEV (NEEDLE) IMPLANT
NEEDLE HYPO 30X.5 LL (NEEDLE) IMPLANT
NEEDLE RETROBULBAR 25GX1.5 (NEEDLE) ×1 IMPLANT
NS IRRIG 1000ML POUR BTL (IV SOLUTION) ×2 IMPLANT
PACK CATARACT MCHSCP (PACKS) IMPLANT
PACK CATARACT/VITRECTOMY 25GA (OPHTHALMIC) IMPLANT
PACK FRAGMATOME (OPHTHALMIC) IMPLANT
PACK VITRECTOMY CUSTOM (CUSTOM PROCEDURE TRAY) ×2 IMPLANT
PAD ARMBOARD 7.5X6 YLW CONV (MISCELLANEOUS) ×4 IMPLANT
RING MALYGIN (MISCELLANEOUS) ×2 IMPLANT
SHIELD EYE LENSE ONLY DISP (GAUZE/BANDAGES/DRESSINGS) IMPLANT
SOL ANTI FOG 6CC (MISCELLANEOUS) IMPLANT
SUT ETHILON 10 0 CS140 6 (SUTURE) IMPLANT
SUT VICRYL 7 0 TG140 8 (SUTURE) IMPLANT
SUT VICRYL 8 0 TG140 8 (SUTURE) IMPLANT
SYR 10ML LL (SYRINGE) IMPLANT
SYR 20ML LL LF (SYRINGE) ×2 IMPLANT
SYR 5ML LL (SYRINGE) ×2 IMPLANT
SYR TB 1ML LUER SLIP (SYRINGE) ×2 IMPLANT
TIP ABS 45DEG FLARED 0.9MM (TIP) ×2 IMPLANT
WATER STERILE IRR 1000ML POUR (IV SOLUTION) ×2 IMPLANT

## 2023-04-13 NOTE — Discharge Instructions (Signed)
KEEP PATCH IN PLACE.  FOLLOW UP AS SCHEDULED IN THE MORNING

## 2023-04-13 NOTE — Anesthesia Postprocedure Evaluation (Signed)
Anesthesia Post Note  Patient: Alexandria Webster  Procedure(s) Performed: CATARACT EXTRACTION EXTRACAPSULAR WITH INTRAOCULAR LENS PLACEMENT (IOC) (Left: Eye)     Patient location during evaluation: PACU Anesthesia Type: MAC Level of consciousness: awake and alert Pain management: pain level controlled Vital Signs Assessment: post-procedure vital signs reviewed and stable Respiratory status: spontaneous breathing, nonlabored ventilation, respiratory function stable and patient connected to nasal cannula oxygen Cardiovascular status: stable and blood pressure returned to baseline Postop Assessment: no apparent nausea or vomiting Anesthetic complications: no  No notable events documented.  Last Vitals:  Vitals:   04/13/23 2037 04/13/23 2045  BP: 134/70 (!) 138/92  Pulse: 88 85  Resp:  19  Temp: 36.7 C   SpO2: 96% 95%    Last Pain:  Vitals:   04/13/23 2037  TempSrc:   PainSc: 0-No pain                 Shelton Silvas

## 2023-04-13 NOTE — OR Nursing (Signed)
Care of patient assumed at 3.

## 2023-04-13 NOTE — H&P (Signed)
  Date of examination:  04/13/23  Indication for surgery: Cataract left eye  Pertinent past medical history:  Past Medical History:  Diagnosis Date   Asthma    COPD (chronic obstructive pulmonary disease) (HCC)    DDD (degenerative disc disease), lumbar    Depression    Diabetes mellitus    Gastroparesis    GERD without esophagitis    High cholesterol    Hypertension    Hypothyroidism    PONV (postoperative nausea and vomiting)    PVD (peripheral vascular disease) (HCC)    Wears glasses     Pertinent ocular history:  proliferative diabetic retinopathy  Pertinent family history:  Family History  Problem Relation Age of Onset   Stroke Mother    Hypertension Mother    Heart failure Father    Asthma Father    Diabetes Father    Heart disease Father    Emphysema Paternal Grandfather    Colon cancer Neg Hx    Colon polyps Neg Hx     General:  Healthy appearing patient in no distress.    Eyes:    Acuity OS 20/400   External: Within normal limits     Anterior segment: Within normal limits       Fundus: hazy view  Impression:Cataract left eye  Plan: Cataract extraction with intraocular lens left eye  Carmela Rima, MD

## 2023-04-13 NOTE — Brief Op Note (Signed)
04/13/2023  8:31 PM  PATIENT:  Alexandria Webster  58 y.o. female  PRE-OPERATIVE DIAGNOSIS:  LEFT EYE CATARACT  POST-OPERATIVE DIAGNOSIS:  LEFT EYE CATARACT  PROCEDURE:  Procedure(s) with comments: CATARACT EXTRACTION EXTRACAPSULAR WITH INTRAOCULAR LENS PLACEMENT (IOC) (Left) - MAC WITH BLOCK  SURGEON:  Surgeons and Role:    * Carmela Rima, MD - Primary  PHYSICIAN ASSISTANT:   ASSISTANTS: none   ANESTHESIA:   local and MAC  EBL:  none   BLOOD ADMINISTERED:none  DRAINS: none   LOCAL MEDICATIONS USED:  MARCAINE    and LIDOCAINE   SPECIMEN:  No Specimen  DISPOSITION OF SPECIMEN:  N/A  COUNTS:  YES  TOURNIQUET:  * No tourniquets in log *  DICTATION: .Note written in EPIC  PLAN OF CARE: Discharge to home after PACU  PATIENT DISPOSITION:  PACU - hemodynamically stable.   Delay start of Pharmacological VTE agent (>24hrs) due to surgical blood loss or risk of bleeding: not applicable

## 2023-04-13 NOTE — Anesthesia Preprocedure Evaluation (Signed)
Anesthesia Evaluation  Patient identified by MRN, date of birth, ID band Patient awake    Reviewed: Allergy & Precautions, H&P , NPO status , Patient's Chart, lab work & pertinent test results  History of Anesthesia Complications (+) PONV and history of anesthetic complications  Airway Mallampati: II  TM Distance: >3 FB Neck ROM: Full    Dental no notable dental hx.    Pulmonary asthma , COPD, former smoker   Pulmonary exam normal breath sounds clear to auscultation       Cardiovascular hypertension, + Peripheral Vascular Disease  Normal cardiovascular exam Rhythm:Regular Rate:Normal     Neuro/Psych  PSYCHIATRIC DISORDERS  Depression    negative neurological ROS  negative psych ROS   GI/Hepatic Neg liver ROS,GERD  ,,gastroparesis   Endo/Other  diabetes, Insulin DependentHypothyroidism    Renal/GU Renal diseasenegative Renal ROS  negative genitourinary   Musculoskeletal negative musculoskeletal ROS (+) Arthritis ,    Abdominal   Peds negative pediatric ROS (+)  Hematology negative hematology ROS (+)   Anesthesia Other Findings   Reproductive/Obstetrics negative OB ROS                             Anesthesia Physical Anesthesia Plan  ASA: 3  Anesthesia Plan: MAC   Post-op Pain Management: Minimal or no pain anticipated   Induction: Intravenous  PONV Risk Score and Plan: 2 and Treatment may vary due to age or medical condition and Propofol infusion  Airway Management Planned: Natural Airway, Simple Face Mask and Mask  Additional Equipment: None  Intra-op Plan:   Post-operative Plan: Extubation in OR  Informed Consent: I have reviewed the patients History and Physical, chart, labs and discussed the procedure including the risks, benefits and alternatives for the proposed anesthesia with the patient or authorized representative who has indicated his/her understanding and  acceptance.     Dental advisory given  Plan Discussed with: CRNA and Anesthesiologist  Anesthesia Plan Comments:        Anesthesia Quick Evaluation

## 2023-04-13 NOTE — Transfer of Care (Signed)
Immediate Anesthesia Transfer of Care Note  Patient: Alexandria Webster  Procedure(s) Performed: CATARACT EXTRACTION EXTRACAPSULAR WITH INTRAOCULAR LENS PLACEMENT (IOC) (Left: Eye)  Patient Location: PACU  Anesthesia Type:MAC  Level of Consciousness: awake, alert , and oriented  Airway & Oxygen Therapy: Patient Spontanous Breathing  Post-op Assessment: Report given to RN and Post -op Vital signs reviewed and stable  Post vital signs: Reviewed and stable  Last Vitals:  Vitals Value Taken Time  BP 134/70 04/13/23 2038  Temp 98   Pulse 90 04/13/23 2039  Resp 12 04/13/23 2039  SpO2 96 % 04/13/23 2039  Vitals shown include unfiled device data.  Last Pain:  Vitals:   04/13/23 1325  TempSrc:   PainSc: 0-No pain         Complications: No notable events documented.

## 2023-04-14 ENCOUNTER — Encounter (HOSPITAL_COMMUNITY): Payer: Self-pay | Admitting: Ophthalmology

## 2023-04-15 DIAGNOSIS — Z419 Encounter for procedure for purposes other than remedying health state, unspecified: Secondary | ICD-10-CM | POA: Diagnosis not present

## 2023-04-19 ENCOUNTER — Ambulatory Visit: Payer: Medicaid Other | Admitting: Nurse Practitioner

## 2023-04-24 ENCOUNTER — Encounter: Payer: Medicaid Other | Admitting: Physical Medicine and Rehabilitation

## 2023-05-13 DIAGNOSIS — Z419 Encounter for procedure for purposes other than remedying health state, unspecified: Secondary | ICD-10-CM | POA: Diagnosis not present

## 2023-05-17 NOTE — Op Note (Signed)
 Alexandria Webster 04/13/2023 Diagnosis: Cataract, Age-related cataract, nuclear, left.  Procedure:  Phacoemuslfication with insertion of intraocular lens prosthesis, left eye Operative Eye:  left eye  Surgeon: Harrold Donath Estimated Blood Loss: minimal Specimens for Pathology:  None Complications: none  After time out confirmed the correct operative eye as the left eye, a 5 mL peribulbar injection consisting of 2% Lidocaine with epinephrine and 0.5 cc Hylenex, akinesia and anesthesia were achieved with no evidence of retrobulbar hemorrhage.  The patient was taken to the operating room and placed in the supine position. The appropriate anesthetic monitoring devices were attached, and the operative eye was prepped and draped in the usual sterile fashion. The operating microscope was swung into position over the eye and a wire lid speculum placed.  A clear cornea paracentesis was fashioned at 2 o'clock. Then using the keratome blade, a clear cornea incision was made superiorly. Viscoat was injected, and a continuous curvilinear capsulotomy was fashioned with a prebent needle and Utrata forceps. The anterior capsule flap was removed, and balanced salt solution was used to hydrodissect and hydrodelineate the nucleus. The phacoemulsification handpiece was introduced, and removal of the nucleus was carried out without complication. The residual cortical lens material was removed with the irrigation and aspiration handpiece. The capsular bag was inspected and found to be intact. Provisc was again injected to fill the capsular bag. Next, a posterior chamber intraocular lens implant (Alcon CNAOTx 24.00 Diopter) was brought into the operative field and found to be free of defects, and then placed in the capsular bag with good fixation with the aid of inserter. Positioning of the haptics was achieved using the Conner wand. The viscoelastic was then removed using the irrigation and aspiration handpiece.  Balanced salt solution was injected to reform the anterior chamber to a normal tactile pressure. The corneal wound was sutured with 10-0 nylon suture. Subconjunctival injections of antbiotic and Decadron were placed. The eye was then patched and shielded in the usual sterile fashion. The patient was transported to the recovery room in stable condition.  The patient tolerated the procedure well without any complications and was transferred to recovery.   Harrold Donath MD

## 2023-05-18 DIAGNOSIS — E113512 Type 2 diabetes mellitus with proliferative diabetic retinopathy with macular edema, left eye: Secondary | ICD-10-CM | POA: Diagnosis not present

## 2023-06-22 ENCOUNTER — Other Ambulatory Visit: Payer: Self-pay | Admitting: Gastroenterology

## 2023-06-22 DIAGNOSIS — E113512 Type 2 diabetes mellitus with proliferative diabetic retinopathy with macular edema, left eye: Secondary | ICD-10-CM | POA: Diagnosis not present

## 2023-06-22 DIAGNOSIS — E113513 Type 2 diabetes mellitus with proliferative diabetic retinopathy with macular edema, bilateral: Secondary | ICD-10-CM | POA: Diagnosis not present

## 2023-06-23 ENCOUNTER — Encounter: Payer: Medicaid Other | Admitting: Physical Medicine and Rehabilitation

## 2023-06-24 DIAGNOSIS — Z419 Encounter for procedure for purposes other than remedying health state, unspecified: Secondary | ICD-10-CM | POA: Diagnosis not present

## 2023-06-28 ENCOUNTER — Other Ambulatory Visit: Payer: Self-pay | Admitting: Physical Medicine and Rehabilitation

## 2023-06-28 ENCOUNTER — Ambulatory Visit: Payer: Medicaid Other | Admitting: Nurse Practitioner

## 2023-06-28 DIAGNOSIS — E119 Type 2 diabetes mellitus without complications: Secondary | ICD-10-CM

## 2023-06-28 DIAGNOSIS — E039 Hypothyroidism, unspecified: Secondary | ICD-10-CM

## 2023-06-28 DIAGNOSIS — Z7985 Long-term (current) use of injectable non-insulin antidiabetic drugs: Secondary | ICD-10-CM

## 2023-06-28 DIAGNOSIS — Z7984 Long term (current) use of oral hypoglycemic drugs: Secondary | ICD-10-CM

## 2023-06-28 DIAGNOSIS — E782 Mixed hyperlipidemia: Secondary | ICD-10-CM

## 2023-07-04 ENCOUNTER — Other Ambulatory Visit: Payer: Self-pay | Admitting: Nurse Practitioner

## 2023-07-14 ENCOUNTER — Other Ambulatory Visit: Payer: Self-pay | Admitting: Nurse Practitioner

## 2023-07-19 DIAGNOSIS — N189 Chronic kidney disease, unspecified: Secondary | ICD-10-CM | POA: Diagnosis not present

## 2023-07-19 DIAGNOSIS — R809 Proteinuria, unspecified: Secondary | ICD-10-CM | POA: Diagnosis not present

## 2023-07-19 DIAGNOSIS — E559 Vitamin D deficiency, unspecified: Secondary | ICD-10-CM | POA: Diagnosis not present

## 2023-07-19 DIAGNOSIS — E211 Secondary hyperparathyroidism, not elsewhere classified: Secondary | ICD-10-CM | POA: Diagnosis not present

## 2023-07-19 DIAGNOSIS — D631 Anemia in chronic kidney disease: Secondary | ICD-10-CM | POA: Diagnosis not present

## 2023-07-21 DIAGNOSIS — E113512 Type 2 diabetes mellitus with proliferative diabetic retinopathy with macular edema, left eye: Secondary | ICD-10-CM | POA: Diagnosis not present

## 2023-07-24 DIAGNOSIS — Z419 Encounter for procedure for purposes other than remedying health state, unspecified: Secondary | ICD-10-CM | POA: Diagnosis not present

## 2023-08-18 DIAGNOSIS — E113512 Type 2 diabetes mellitus with proliferative diabetic retinopathy with macular edema, left eye: Secondary | ICD-10-CM | POA: Diagnosis not present

## 2023-08-24 DIAGNOSIS — Z419 Encounter for procedure for purposes other than remedying health state, unspecified: Secondary | ICD-10-CM | POA: Diagnosis not present

## 2023-08-25 ENCOUNTER — Encounter: Attending: Physical Medicine and Rehabilitation | Admitting: Physical Medicine and Rehabilitation

## 2023-08-25 ENCOUNTER — Encounter: Payer: Self-pay | Admitting: Physical Medicine and Rehabilitation

## 2023-08-25 VITALS — BP 132/84 | HR 88

## 2023-08-25 DIAGNOSIS — G959 Disease of spinal cord, unspecified: Secondary | ICD-10-CM | POA: Diagnosis not present

## 2023-08-25 DIAGNOSIS — R2689 Other abnormalities of gait and mobility: Secondary | ICD-10-CM | POA: Insufficient documentation

## 2023-08-25 DIAGNOSIS — R252 Cramp and spasm: Secondary | ICD-10-CM | POA: Diagnosis not present

## 2023-08-25 MED ORDER — PAROXETINE HCL 20 MG PO TABS
20.0000 mg | ORAL_TABLET | Freq: Every day | ORAL | 1 refills | Status: AC
Start: 2023-08-25 — End: ?

## 2023-08-25 MED ORDER — HYDROXYZINE HCL 25 MG PO TABS: 25.0000 mg | ORAL_TABLET | Freq: Three times a day (TID) | ORAL | 1 refills | Status: DC | PRN

## 2023-08-25 MED ORDER — MIDODRINE HCL 10 MG PO TABS: 10.0000 mg | ORAL_TABLET | Freq: Three times a day (TID) | ORAL | 1 refills | Status: DC

## 2023-08-25 NOTE — Patient Instructions (Signed)
 Pt is a 58 yr old female with hx of cervical myelopathy s/p C5-T1 decompression lamintomy and PCDF 07/19/22 by Dr Adonis Alamin.  She also has hx of post op pain; COPD, hx of HTN with orthostatic hypotension; DM; Hypothyroidism, and acut eon chronic constipation;  Here for  f/u on cervical myelopathy.   Paxil- Paroxetine-  20 mg day or night, but 1x/day- for anxiety and depression.  3 months supply with 1 refill   2. Has to make appt to see Dr Meredith Stalls- to discuss sleep issues.    3.  Hydroxyzine 25 mg 3x/day as needed for Anxiety as well as 25- 50 mg nightly for sleep - 3 months supply with 1 refill   4. Has some spasticity-  When feeling stiff, try it and see if it works-  5 mg 3x/day AS NEEDED- but know that CAN possibly make you weaker.  1st dose, first hour, make sure someone is with you-   5.  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 F/U  in 3 months- f/u on SCI/cervical myelopathy- double appt

## 2023-08-25 NOTE — Progress Notes (Addendum)
 Subjective:    Patient ID: Alexandria Webster, female    DOB: 07/07/1965, 58 y.o.   MRN: [id number]  HPI  Pt is a 58 yr old female with hx of cervical myelopathy s/p C5-T1 decompression lamintomy and PCDF 07/19/22 by Dr Adonis Alamin.  She also has hx of post op pain; COPD, hx of HTN with orthostatic hypotension; DM; Hypothyroidism, and acut eon chronic constipation;  Here for  f/u on cervical myelopathy.    Alexandria Webster Daughter-   Recovery going too slow.  Esp on eyes-  (hx of retinal detachment B/L- s/ surgery B/L)   Still using RW to walk-  walking some days with it; better, but  not always.   L knee will fold up under her.  Something catches- but not spasms- and won't let go . When lets go, can go back to walking If walks a lot-  sometimes 1x/day- sometimes 4x/day.   L side has less strength than R side.    Can walk  driveway- pretty long- and walks to end and back up.  Cannot walk at grocery store.   Not a lot of pain- sometimes neck pain Been off pain pills and muscle relaxants for awhile.  And takes Trazodone  150 mg- but not working at all- so tired, but cannot sleep- but falls asleep around 5am   Still taking Midodrine  3x/day- was getting so dizzy, and we went up to [facility]-8am, 12 and 4pm.    Depressed frequently- so bad.  When left alone-  gets anxious and paranoid- better when someone is there.   Has little dogs to fall back upon.    Hasn't gotten TSH checked lately   Thinks could do therapy- wants to go again.  Pain Inventory Average Pain 1 Pain Right Now 0 My pain is sharp  In the last 24 hours, has pain interfered with the following? General activity 1 Relation with others 10 Enjoyment of life 10 What TIME of day is your pain at its worst? night Sleep (in general) Poor  Pain is worse with: some activites Pain improves with: rest and heat/ice Relief from Meds: .  Family History  Problem Relation Age of Onset   Stroke Mother    Hypertension Mother    Heart  failure Father    Asthma Father    Diabetes Father    Heart disease Father    Emphysema Paternal Grandfather    Colon cancer Neg Hx    Colon polyps Neg Hx    Social History   Socioeconomic History   Marital status: Single    Spouse name: Not on file   Number of children: 2   Years of education: Not on file   Highest education level: Not on file  Occupational History   Not on file  Tobacco Use   Smoking status: Former    Current packs/day: 0.00    Types: Cigarettes    Quit date: 03/14/1982    Years since quitting: 41.4    Passive exposure: Past   Smokeless tobacco: Current    Types: Snuff  Vaping Use   Vaping status: Never Used  Substance and Sexual Activity   Alcohol use: No   Drug use: No   Sexual activity: Yes    Birth control/protection: Surgical    Comment: tubal  Other Topics Concern   Not on file  Social History Narrative   Not on file   Social Drivers of Health   Financial Resource Strain: Not on file  Food  Insecurity: Not on file  Transportation Needs: Not on file  Physical Activity: Not on file  Stress: Not on file  Social Connections: Not on file   Past Surgical History:  Procedure Laterality Date   AIR/FLUID EXCHANGE Right 11/22/2022   Procedure: AIR/FLUID EXCHANGE;  Surgeon: Jearline Minder, MD;  Location: [facility] OR;  Service: Ophthalmology;  Laterality: Right;   AORTA - BILATERAL FEMORAL ARTERY BYPASS GRAFT N/A 09/27/2018   Procedure: Redo Exposure  AORTOBIFEMORAL Bypass and bilateral femoral Artery ; Redo Aortobifemoral  BYPASS GRAFT.;  Surgeon: Young Hensen, MD;  Location: [facility] OR;  Service: Vascular;  Laterality: N/A;   BACK SURGERY     BIOPSY  10/14/2021   Procedure: BIOPSY;  Surgeon: Vinetta Greening, DO;  [facility];  Service: Endoscopy;;   CATARACT EXTRACTION EXTRACAPSULAR Left 04/13/2023   Procedure: CATARACT EXTRACTION EXTRACAPSULAR WITH INTRAOCULAR LENS PLACEMENT (IOC);  Surgeon: Jearline Minder, MD;  Location: [facility] OR;   Service: Ophthalmology;  Laterality: Left;  MAC WITH BLOCK   COLONOSCOPY WITH PROPOFOL  N/A 10/14/2021   Procedure: COLONOSCOPY WITH PROPOFOL ;  Surgeon: Vinetta Greening, DO;  [facility];  Service: Endoscopy;  Laterality: N/A;  8:30am   ESOPHAGOGASTRODUODENOSCOPY (EGD) WITH PROPOFOL  N/A 10/14/2021   Procedure: ESOPHAGOGASTRODUODENOSCOPY (EGD) WITH PROPOFOL ;  Surgeon: Vinetta Greening, DO;  [facility];  Service: Endoscopy;  Laterality: N/A;   FEMORAL ARTERY STENT  right leg   INJECTION OF SILICONE OIL Left 09/06/2022   Procedure: INJECTION OF SILICONE OIL;  Surgeon: Jearline Minder, MD;  Location: [facility] OR;  Service: Ophthalmology;  Laterality: Left;   INJECTION OF SILICONE OIL Right 11/22/2022   Procedure: INJECTION OF SILICONE OIL;  Surgeon: Jearline Minder, MD;  Location: [facility] OR;  Service: Ophthalmology;  Laterality: Right;   MEMBRANE PEEL Left 09/06/2022   Procedure: MEMBRANECTOMY;  Surgeon: Jearline Minder, MD;  Location: [facility] OR;  Service: Ophthalmology;  Laterality: Left;   PARS PLANA VITRECTOMY Left 09/06/2022   Procedure: PARS PLANA VITRECTOMY WITH 25 GAUGE;  Surgeon: Jearline Minder, MD;  Location: [facility] OR;  Service: Ophthalmology;  Laterality: Left;   PARS PLANA VITRECTOMY Left 02/14/2023   Procedure: PARS PLANA VITRECTOMY WITH 25 GAUGE;  Surgeon: Jearline Minder, MD;  Location: [facility] OR;  Service: Ophthalmology;  Laterality: Left;   PHOTOCOAGULATION WITH LASER Left 09/06/2022   Procedure: PHOTOCOAGULATION WITH LASER;  Surgeon: Jearline Minder, MD;  Location: [facility] OR;  Service: Ophthalmology;  Laterality: Left;   PHOTOCOAGULATION WITH LASER Right 11/22/2022   Procedure: PHOTOCOAGULATION WITH LASER;  Surgeon: Jearline Minder, MD;  Location: [facility] OR;  Service: Ophthalmology;  Laterality: Right;   PHOTOCOAGULATION WITH LASER Left 02/14/2023   Procedure: PHOTOCOAGULATION WITH LASER;  Surgeon: Jearline Minder, MD;  Location: [facility] OR;  Service: Ophthalmology;  Laterality: Left;    POSTERIOR CERVICAL FUSION/FORAMINOTOMY N/A 07/19/2022   Procedure: Posterior cervical fusion with lateral mass fixation - Cervical four-cervical five, Cervical five-Cervical six - Cervical six-Cervical seven - Cervical seven-Thoracic one with laminectomy;  Surgeon: Agustina Aldrich, MD;  Location: [facility] OR;  Service: Neurosurgery;  Laterality: N/A;   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   Procedure: REPAIR OF COMPLEX TRACTION RETINAL DETACHMENT;  Surgeon: Jearline Minder, MD;  Location: [facility] OR;  Service: Ophthalmology;  Laterality: Left;  MAC WITH BLOCK   REPAIR OF COMPLEX TRACTION RETINAL DETACHMENT Right 11/22/2022   Procedure: REPAIR OF COMPLEX TRACTION RETINAL DETACHMENT;  Surgeon: Jearline Minder, MD;  Location: [facility] OR;  Service: Ophthalmology;  Laterality: Right;  MAC WITH BLOCK   SILICON OIL REMOVAL Left 02/14/2023   Procedure: SILICON OIL REMOVAL;  Surgeon: Jearline Minder, MD;  Location: [facility] OR;  Service: Ophthalmology;  Laterality: Left;   TUBAL LIGATION     Past Surgical History:  Procedure Laterality Date   AIR/FLUID EXCHANGE Right 11/22/2022   Procedure: AIR/FLUID EXCHANGE;  Surgeon: Jearline Minder, MD;  Location[facility] OR;  Service: Ophthalmology;  Laterality: Right;   AORTA - BILATERAL FEMORAL ARTERY BYPASS GRAFT N/A 09/27/2018   Procedure: Redo Exposure  AORTOBIFEMORAL Bypass and bilateral femoral Artery ; Redo Aortobifemoral  BYPASS GRAFT.;  Surgeon: Young Hensen, MD;  Location: [facility] OR;  Service: Vascular;  Laterality: N/A;   BACK SURGERY     BIOPSY  10/14/2021   Procedure: BIOPSY;  Surgeon: Vinetta Greening, DO;  [facility];  Service: Endoscopy;;   CATARACT EXTRACTION EXTRACAPSULAR Left 04/13/2023   Procedure: CATARACT EXTRACTION EXTRACAPSULAR WITH INTRAOCULAR LENS PLACEMENT (IOC);  Surgeon: Jearline Minder, MD;  Location: [facility] OR;  Service: Ophthalmology;  Laterality: Left;  MAC WITH BLOCK   COLONOSCOPY WITH PROPOFOL  N/A 10/14/2021   Procedure:  COLONOSCOPY WITH PROPOFOL ;  Surgeon: Vinetta Greening, DO;  [facility];  Service: Endoscopy;  Laterality: N/A;  8:30am   ESOPHAGOGASTRODUODENOSCOPY (EGD) WITH PROPOFOL  N/A 10/14/2021   Procedure: ESOPHAGOGASTRODUODENOSCOPY (EGD) WITH PROPOFOL ;  Surgeon: Vinetta Greening, DO;  [facility];  Service: Endoscopy;  Laterality: N/A;   FEMORAL ARTERY STENT  right leg   INJECTION OF SILICONE OIL Left 09/06/2022   Procedure: INJECTION OF SILICONE OIL;  Surgeon: Jearline Minder, MD;  Location: [facility] OR;  Service: Ophthalmology;  Laterality: Left;   INJECTION OF SILICONE OIL Right 11/22/2022   Procedure: INJECTION OF SILICONE OIL;  Surgeon: Jearline Minder, MD;  Location: [facility] OR;  Service: Ophthalmology;  Laterality: Right;   MEMBRANE PEEL Left 09/06/2022   Procedure: MEMBRANECTOMY;  Surgeon: Jearline Minder, MD;  Location: [facility] OR;  Service: Ophthalmology;  Laterality: Left;   PARS PLANA VITRECTOMY Left 09/06/2022   Procedure: PARS PLANA VITRECTOMY WITH 25 GAUGE;  Surgeon: Jearline Minder, MD;  Location: [facility] OR;  Service: Ophthalmology;  Laterality: Left;   PARS PLANA VITRECTOMY Left 02/14/2023   Procedure: PARS PLANA VITRECTOMY WITH 25 GAUGE;  Surgeon: Jearline Minder, MD;  Location: [facility] OR;  Service: Ophthalmology;  Laterality: Left;   PHOTOCOAGULATION WITH LASER Left 09/06/2022   Procedure: PHOTOCOAGULATION WITH LASER;  Surgeon: Jearline Minder, MD;  Location: [facility] OR;  Service: Ophthalmology;  Laterality: Left;   PHOTOCOAGULATION WITH LASER Right 11/22/2022   Procedure: PHOTOCOAGULATION WITH LASER;  Surgeon: Jearline Minder, MD;  Location: [facility] OR;  Service: Ophthalmology;  Laterality: Right;   PHOTOCOAGULATION WITH LASER Left 02/14/2023   Procedure: PHOTOCOAGULATION WITH LASER;  Surgeon: Jearline Minder, MD;  Location: [facility] OR;  Service: Ophthalmology;  Laterality: Left;   POSTERIOR CERVICAL FUSION/FORAMINOTOMY N/A 07/19/2022   Procedure: Posterior cervical fusion with lateral mass fixation -  Cervical four-cervical five, Cervical five-Cervical six - Cervical six-Cervical seven - Cervical seven-Thoracic one with laminectomy;  Surgeon: Agustina Aldrich, MD;  Location: [facility] OR;  Service: Neurosurgery;  Laterality: N/A;   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   Procedure: REPAIR OF COMPLEX TRACTION RETINAL DETACHMENT;  Surgeon: Jearline Minder, MD;  Location: [facility] OR;  Service: Ophthalmology;  Laterality: Left;  MAC WITH BLOCK   REPAIR OF COMPLEX TRACTION RETINAL DETACHMENT Right 11/22/2022   Procedure: REPAIR OF COMPLEX TRACTION RETINAL DETACHMENT;  Surgeon: Jearline Minder, MD;  Location: [facility] OR;  Service: Ophthalmology;  Laterality: Right;  MAC WITH BLOCK   SILICON OIL REMOVAL Left 02/14/2023   Procedure: SILICON OIL REMOVAL;  Surgeon: Jearline Minder, MD;  Location: [facility] OR;  Service: Ophthalmology;  Laterality: Left;   TUBAL LIGATION     Past Medical History:  Diagnosis Date   Asthma    COPD (chronic obstructive pulmonary disease) (HCC)    DDD (degenerative disc disease), lumbar    Depression    Diabetes mellitus    Gastroparesis    GERD without esophagitis    High cholesterol    Hypertension    Hypothyroidism    PONV (postoperative nausea and vomiting)    PVD (peripheral vascular disease) (HCC)    Wears glasses    BP 132/84   Pulse 88   LMP 01/21/2012   SpO2 96%   Opioid Risk Score:   Fall Risk Score:  `1  Depression screen [facility] 2/9     03/09/2023   11:27 AM 12/26/2022   10:43 AM 08/15/2022    8:47 AM 05/09/2016   11:34 AM 10/14/2015    1:42 PM 09/28/2015    3:08 PM 09/14/2015   10:08 AM  Depression screen PHQ 2/9  Decreased Interest 1 1 3  0 0 0 0  Down, Depressed, Hopeless 1 1 2  0 0 0 0  PHQ - 2 Score 2 2 5  0 0 0 0  Altered sleeping   2      Tired, decreased energy   2      Change in appetite   3      Feeling bad or failure about yourself    0      Trouble concentrating   3      Moving slowly or fidgety/restless   0      Suicidal thoughts   0       PHQ-9 Score   15         Review of Systems  Musculoskeletal:  Positive for neck pain.       Bilateral shoulder pain Bilateral leg pain  All other systems reviewed and are negative.     Objective:   Physical Exam  Awake, alert, decreased hair on head; in transport w/c; NAD Appears older than stated age No increased tone in LLE- no clonus MSK: RUE deltoids 4/5; otherwise 4+/5 throughout LUE- deltoid 4-/5; biceps/triceps, WE 4/5; grip and FA 4-/5 RLE- HF 4-/5; KE 4+/5; KF 4/5; DF 3+/5 and PF 4+/5 LLE- HF 4-/5; KE 4/5; KF 4+/5; DF 4-/5 and PF 4+/5   Neuro: LLE 2-3 beats clonus but RLE 0 beats clonus No increased tone in RLE- questionable MAS of 1 in L knee-  Hoffman's LUE but don't see in RUE  MAS of 1 in L elbow- but no clonus at wrists b/l and no increase tone in RUE    Assessment & Plan:   Pt is a 58 yr old female with hx of cervical myelopathy s/p C5-T1 decompression lamintomy and PCDF 07/19/22 by Dr Adonis Alamin.  She also has hx of post op pain; COPD, hx of HTN with orthostatic hypotension; DM; Hypothyroidism, and acut eon chronic constipation;  Here for  f/u on cervical myelopathy.   Paxil- Paroxetine-  20 mg day or night, but 1x/day- for anxiety and depression.  3 months supply with 1 refill   2. Has to make appt to see Dr Meredith Stalls- to discuss sleep issues.    3.  Hydroxyzine 25 mg 3x/day as needed  for Anxiety as well as 25- 50 mg nightly for sleep - 3 months supply with 1 refill   4. Has some spasticity-  When feeling stiff, try it and see if it works-  5 mg 3x/day AS NEEDED- but know that CAN possibly make you weaker.  1st dose, first hour, make sure someone is with you-   5.  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 Refill Midodrine  10 mg 3x/day- for orthostatic hypotension due to SCI- con't for 6 months -3  months supply   7. F/U  in 3 months- f/u on SCI/cervical myelopathy- double appt   I spent a total of 36   minutes on total care today- >50% coordination of care- due to  detailed about BP issues, depression/anxiety and spasticity and made multiple changes to meds

## 2023-09-08 ENCOUNTER — Encounter (HOSPITAL_COMMUNITY): Payer: Self-pay | Admitting: Ophthalmology

## 2023-09-08 NOTE — Anesthesia Preprocedure Evaluation (Signed)
 Anesthesia Evaluation  Patient identified by MRN, date of birth, ID band Patient awake    Reviewed: Allergy & Precautions, H&P , NPO status , Patient's Chart, lab work & pertinent test results  History of Anesthesia Complications (+) PONV and history of anesthetic complications  Airway Mallampati: II  TM Distance: >3 FB Neck ROM: Full    Dental  (+) Edentulous Upper,    Pulmonary asthma , COPD, former smoker   breath sounds clear to auscultation       Cardiovascular (-) angina + Peripheral Vascular Disease  (-) Past MI  Rhythm:Regular     Neuro/Psych neg Seizures PSYCHIATRIC DISORDERS  Depression       GI/Hepatic Neg liver ROS,GERD  ,,gastroparesis   Endo/Other  diabetes, Insulin  DependentHypothyroidism    Renal/GU Renal disease     Musculoskeletal  (+) Arthritis ,    Abdominal   Peds  Hematology negative hematology ROS (+)   Anesthesia Other Findings   Reproductive/Obstetrics                              Anesthesia Physical Anesthesia Plan  ASA: 3  Anesthesia Plan: MAC   Post-op Pain Management: Minimal or no pain anticipated   Induction: Intravenous  PONV Risk Score and Plan: 2 and Treatment may vary due to age or medical condition and Ondansetron   Airway Management Planned: Natural Airway, Simple Face Mask and Mask  Additional Equipment: None  Intra-op Plan:   Post-operative Plan:   Informed Consent: I have reviewed the patients History and Physical, chart, labs and discussed the procedure including the risks, benefits and alternatives for the proposed anesthesia with the patient or authorized representative who has indicated his/her understanding and acceptance.     Dental advisory given  Plan Discussed with: CRNA  Anesthesia Plan Comments: (See PAT note from 6/27)        Anesthesia Quick Evaluation

## 2023-09-08 NOTE — Progress Notes (Signed)
 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 PMH of former smoking, HTN, PVD s/p aortobifemoral bypass (~2000 with redo in 2020), orthostatic hypotension, asthma, COPD, T2DM, gastroparesis, hypothyroidism, GERD, arthritis, chronic pain  Prior anesthesia complications include PONV. Had eye surgery in 03/2023. No complications.  Patient follows with Vascular for hx of PVD. She has hx of aortobifemoral bypass in ~2000 with Dr. Gerlean. This was occluded and underwent redo in 2020 with Dr. Gretta. She also has carotid and vertebral artery stenosis. Last seen in 2023. She was advised to f/u in 1 year for carotid duplex.   Patient follows with Endocrine for her diabetes. A1c on 01/10/23 was 5.2   VS:  Wt Readings from Last 3 Encounters:  04/13/23 47.2 kg  03/09/23 47.2 kg  02/14/23 47.2 kg   Temp Readings from Last 3 Encounters:  04/13/23 36.8 C  02/14/23 37 C  12/12/22 36.4 C   BP Readings from Last 3 Encounters:  08/25/23 132/84  04/13/23 101/73  03/09/23 121/71   Pulse Readings from Last 3 Encounters:  08/25/23 88  04/13/23 93  03/09/23 86     PROVIDERS: Marvine Rush, MD   LABS: Obtain DOS   EKG: Update DOS   CV: US  Carotid 10/04/22:  IMPRESSION: Moderate bilateral carotid atherosclerosis. No significant stenosis. Degree of narrowing less than 50% by ultrasound criteria bilaterally.   Suspect  right vertebral artery distal reconstitution. See above comment.   Left vertebral artery demonstrates normal antegrade flow.  Echo 08/06/21:  IMPRESSIONS     1. Left ventricular ejection fraction, by estimation, is 65 to 70%. Left  ventricular ejection fraction by 2D MOD biplane is 68.5 %. The left  ventricle has normal function. The left ventricle has no regional wall  motion abnormalities. Left ventricular  diastolic parameters are consistent with Grade I diastolic dysfunction  (impaired relaxation).   2. Right ventricular systolic function is normal. The right ventricular  size is normal. Tricuspid regurgitation signal is inadequate for assessing  PA pressure.   3. The mitral valve is grossly normal. No evidence of mitral valve  regurgitation.   4. The aortic valve is tricuspid. Aortic valve regurgitation is not  visualized.   5. The inferior vena cava is normal in size with greater than 50%  respiratory variability, suggesting right atrial pressure of 3 mmHg.   Comparison(s): Changes from prior study are noted. 09/06/2018: LVEF 60-65%,  grade 1 DD.     Past Medical History:  Diagnosis Date   Asthma    COPD (chronic obstructive pulmonary disease) (HCC)    DDD (degenerative disc disease), lumbar    Depression    Diabetes mellitus    Gastroparesis    GERD without esophagitis    High cholesterol    Hypertension    Hypothyroidism    PONV (postoperative nausea and vomiting)    PVD (peripheral vascular disease) (HCC)  Wears glasses     Past Surgical History:  Procedure Laterality Date   AIR/FLUID EXCHANGE Right 11/22/2022   Procedure: AIR/FLUID EXCHANGE;  Surgeon: Tobie Baptist, MD;  Location: [facility] OR;  Service: Ophthalmology;  Laterality: Right;   AORTA - BILATERAL FEMORAL ARTERY BYPASS GRAFT N/A 09/27/2018   Procedure: Redo Exposure  AORTOBIFEMORAL Bypass and bilateral femoral Artery ; Redo Aortobifemoral  BYPASS GRAFT.;  Surgeon: Gretta Lonni PARAS, MD;  Location:  [facility] OR;  Service: Vascular;  Laterality: N/A;   BACK SURGERY     BIOPSY  10/14/2021   Procedure: BIOPSY;  Surgeon: Cindie Carlin POUR, DO;  [facility]: [facility];  Service: Endoscopy;;   CATARACT EXTRACTION EXTRACAPSULAR Left 04/13/2023   Procedure: CATARACT EXTRACTION EXTRACAPSULAR WITH INTRAOCULAR LENS PLACEMENT (IOC);  Surgeon: Tobie Baptist, MD;  Location: [facility] OR;  Service: Ophthalmology;  Laterality: Left;  MAC WITH BLOCK   COLONOSCOPY WITH PROPOFOL  N/A 10/14/2021   Procedure: COLONOSCOPY WITH PROPOFOL ;  Surgeon: Cindie Carlin POUR, DO;  [facility];  Service: Endoscopy;  Laterality: N/A;  8:30am   ESOPHAGOGASTRODUODENOSCOPY (EGD) WITH PROPOFOL  N/A 10/14/2021   Procedure: ESOPHAGOGASTRODUODENOSCOPY (EGD) WITH PROPOFOL ;  Surgeon: Cindie Carlin POUR, DO;  [facility];  Service: Endoscopy;  Laterality: N/A;   FEMORAL ARTERY STENT  right leg   INJECTION OF SILICONE OIL Left 09/06/2022   Procedure: INJECTION OF SILICONE OIL;  Surgeon: Tobie Baptist, MD;  Location: [facility] OR;  Service: Ophthalmology;  Laterality: Left;   INJECTION OF SILICONE OIL Right 11/22/2022   Procedure: INJECTION OF SILICONE OIL;  Surgeon: Tobie Baptist, MD;  Location: [facility] OR;  Service: Ophthalmology;  Laterality: Right;   MEMBRANE PEEL Left 09/06/2022   Procedure: MEMBRANECTOMY;  Surgeon: Tobie Baptist, MD;  Location: [facility] OR;  Service: Ophthalmology;  Laterality: Left;   PARS PLANA VITRECTOMY Left 09/06/2022   Procedure: PARS PLANA VITRECTOMY WITH 25 GAUGE;  Surgeon: Tobie Baptist, MD;  Location: [facility] OR;  Service: Ophthalmology;  Laterality: Left;   PARS PLANA VITRECTOMY Left 02/14/2023   Procedure: PARS PLANA VITRECTOMY WITH 25 GAUGE;  Surgeon: Tobie Baptist, MD;  Location: [facility] OR;  Service: Ophthalmology;  Laterality: Left;   PHOTOCOAGULATION WITH LASER Left 09/06/2022   Procedure: PHOTOCOAGULATION WITH LASER;  Surgeon: Tobie Baptist, MD;  Location: [facility] OR;  Service: Ophthalmology;  Laterality: Left;    PHOTOCOAGULATION WITH LASER Right 11/22/2022   Procedure: PHOTOCOAGULATION WITH LASER;  Surgeon: Tobie Baptist, MD;  Location: [facility] OR;  Service: Ophthalmology;  Laterality: Right;   PHOTOCOAGULATION WITH LASER Left 02/14/2023   Procedure: PHOTOCOAGULATION WITH LASER;  Surgeon: Tobie Baptist, MD;  Location: [facility] OR;  Service: Ophthalmology;  Laterality: Left;   POSTERIOR CERVICAL FUSION/FORAMINOTOMY N/A 07/19/2022   Procedure: Posterior cervical fusion with lateral mass fixation - Cervical four-cervical five, Cervical five-Cervical six - Cervical six-Cervical seven - Cervical seven-Thoracic one with laminectomy;  Surgeon: Louis Shove, MD;  Location: [facility];  Service: Neurosurgery;  Laterality: N/A;   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   Procedure: REPAIR OF COMPLEX TRACTION RETINAL DETACHMENT;  Surgeon: Tobie Baptist, MD;  Location: [facility] OR;  Service: Ophthalmology;  Laterality: Left;  MAC WITH BLOCK   REPAIR OF COMPLEX TRACTION RETINAL DETACHMENT Right 11/22/2022   Procedure: REPAIR OF COMPLEX TRACTION RETINAL DETACHMENT;  Surgeon: Tobie Baptist, MD;  Location: [facility] OR;  Service: Ophthalmology;  Laterality: Right;  MAC WITH BLOCK   SILICON OIL REMOVAL Left 02/14/2023   Procedure: SILICON OIL REMOVAL;  Surgeon: Tobie Baptist, MD;  Location:  [facility] OR;  Service: Ophthalmology;  Laterality: Left;   TUBAL LIGATION      MEDICATIONS: No current facility-administered medications for this encounter.    albuterol  (PROVENTIL ) 2 MG tablet   albuterol  (VENTOLIN  HFA) 108 (90 Base) MCG/ACT inhaler   aspirin  325 MG tablet   cetirizine (ZYRTEC) 10 MG tablet   docusate sodium  (COLACE) 100 MG capsule   esomeprazole  (NEXIUM ) 40 MG capsule   guaiFENesin  (MUCINEX ) 600 MG 12 hr tablet   hydrOXYzine  (ATARAX ) 25 MG tablet   ipratropium (ATROVENT ) 0.03 % nasal spray   levothyroxine  (SYNTHROID ) 50 MCG tablet   LINZESS  290 MCG CAPS capsule   lovastatin  (MEVACOR ) 20 MG tablet   meclizine   (ANTIVERT ) 25 MG tablet   midodrine  (PROAMATINE ) 10 MG tablet   PARoxetine  (PAXIL ) 20 MG tablet   polyethylene glycol (MIRALAX  / GLYCOLAX ) 17 g packet   potassium chloride  (KLOR-CON ) 10 MEQ tablet   timolol (TIMOPTIC) 0.5 % ophthalmic solution   TRULICITY  1.5 MG/0.5ML SOAJ   traZODone  (DESYREL ) 150 MG tablet    Burnard CHRISTELLA Senna, PA-C [facility] Surgical Short Stay/Anesthesiology [facility] Phone [phone] 09/08/2023 9:53 AM

## 2023-09-11 ENCOUNTER — Encounter (HOSPITAL_COMMUNITY): Payer: Self-pay | Admitting: Ophthalmology

## 2023-09-11 ENCOUNTER — Other Ambulatory Visit: Payer: Self-pay

## 2023-09-11 NOTE — Progress Notes (Signed)
 Called and left a message for patient, then her daughter Alexandria Webster called back and confirmed with her that her mother's surgery time had been moved up a half-hour to 1600, and to report at 1330 and not have any clear liquids after 1300.

## 2023-09-11 NOTE — Progress Notes (Signed)
   SDW CALL   Patient not available. Her daughter,[name] who is DPR  was given pre-op instructions over the phone. The opportunity was given for [name] to ask questions. No further questions asked. [name] verbalized understanding of instructions given.     PCP - Dr. Norleen General Cardiologist - denies   PPM/ICD - denies Device Orders - n/a Rep Notified - n/a   Chest x-ray - 07/25/22 - 1 view EKG - 07/15/22- obtain DOS Stress Test - denies ECHO - 08/06/21 Cardiac Cath - denies   Sleep Study - denies CPAP - n/a   Fasting Blood Sugar - 99-102 Checks Blood Sugar ''sometimes   Last dose of GLP1 agonist-  Trulicity  - last dose was 09/02/23 GLP1 instructions: patient instructed not to take prior to surgery tomorrow.   Blood Thinner Instructions: n/a Aspirin  Instructions: per patient's daughter, patient's last dose of Aspirin  was 09/08/23. Patient reports she was instructed to hold it three days prior to surgery by Dr. Anthony   ERAS Protcol - clears until 1330 PRE-SURGERY Ensure or G2- n/a   COVID TEST- n/a     Check your blood sugar the morning of your surgery when you wake up and every 2 hours until you get to the Short Stay unit.  If your blood sugar is less than 70 mg/dL, you will need to treat for low blood sugar: Do not take insulin . Treat a low blood sugar (less than 70 mg/dL) with  cup of clear juice (cranberry or apple), 4 glucose tablets, OR glucose gel. Recheck blood sugar in 15 minutes after treatment (to make sure it is greater than 70 mg/dL). If your blood sugar is not greater than 70 mg/dL on recheck, call [phone] for further instructions. Report your blood sugar to the short stay nurse when you get to Short Stay. SABRA

## 2023-09-12 ENCOUNTER — Ambulatory Visit (HOSPITAL_COMMUNITY)
Admission: RE | Admit: 2023-09-12 | Discharge: 2023-09-12 | Disposition: A | Discharge status: Home / Self Care | Attending: Ophthalmology | Admitting: Ophthalmology

## 2023-09-12 ENCOUNTER — Encounter (HOSPITAL_COMMUNITY)
Admission: RE | Disposition: A | Discharge status: Home / Self Care | Payer: Self-pay | Source: Home / Self Care | Attending: Ophthalmology

## 2023-09-12 ENCOUNTER — Encounter (HOSPITAL_COMMUNITY): Payer: Self-pay | Admitting: Ophthalmology

## 2023-09-12 ENCOUNTER — Encounter (HOSPITAL_COMMUNITY): Payer: Self-pay | Admitting: Medical

## 2023-09-12 ENCOUNTER — Ambulatory Visit (HOSPITAL_BASED_OUTPATIENT_CLINIC_OR_DEPARTMENT_OTHER): Payer: Self-pay | Admitting: Medical

## 2023-09-12 DIAGNOSIS — E1151 Type 2 diabetes mellitus with diabetic peripheral angiopathy without gangrene: Secondary | ICD-10-CM | POA: Diagnosis not present

## 2023-09-12 DIAGNOSIS — H44751 Retained (nonmagnetic) (old) foreign body in vitreous body, right eye: Secondary | ICD-10-CM

## 2023-09-12 DIAGNOSIS — X58XXXA Exposure to other specified factors, initial encounter: Secondary | ICD-10-CM | POA: Insufficient documentation

## 2023-09-12 DIAGNOSIS — I951 Orthostatic hypotension: Secondary | ICD-10-CM | POA: Insufficient documentation

## 2023-09-12 DIAGNOSIS — Z7982 Long term (current) use of aspirin: Secondary | ICD-10-CM | POA: Insufficient documentation

## 2023-09-12 DIAGNOSIS — E119 Type 2 diabetes mellitus without complications: Secondary | ICD-10-CM | POA: Diagnosis not present

## 2023-09-12 DIAGNOSIS — I10 Essential (primary) hypertension: Secondary | ICD-10-CM | POA: Diagnosis not present

## 2023-09-12 DIAGNOSIS — E039 Hypothyroidism, unspecified: Secondary | ICD-10-CM | POA: Diagnosis not present

## 2023-09-12 DIAGNOSIS — T85398A Other mechanical complication of other ocular prosthetic devices, implants and grafts, initial encounter: Secondary | ICD-10-CM | POA: Insufficient documentation

## 2023-09-12 DIAGNOSIS — K3184 Gastroparesis: Secondary | ICD-10-CM | POA: Insufficient documentation

## 2023-09-12 DIAGNOSIS — E1143 Type 2 diabetes mellitus with diabetic autonomic (poly)neuropathy: Secondary | ICD-10-CM | POA: Diagnosis not present

## 2023-09-12 DIAGNOSIS — G8929 Other chronic pain: Secondary | ICD-10-CM | POA: Diagnosis not present

## 2023-09-12 DIAGNOSIS — Z7985 Long-term (current) use of injectable non-insulin antidiabetic drugs: Secondary | ICD-10-CM | POA: Diagnosis not present

## 2023-09-12 DIAGNOSIS — Z87891 Personal history of nicotine dependence: Secondary | ICD-10-CM | POA: Diagnosis not present

## 2023-09-12 DIAGNOSIS — H338 Other retinal detachments: Secondary | ICD-10-CM | POA: Diagnosis not present

## 2023-09-12 DIAGNOSIS — M1990 Unspecified osteoarthritis, unspecified site: Secondary | ICD-10-CM | POA: Insufficient documentation

## 2023-09-12 DIAGNOSIS — K219 Gastro-esophageal reflux disease without esophagitis: Secondary | ICD-10-CM | POA: Diagnosis not present

## 2023-09-12 DIAGNOSIS — F32A Depression, unspecified: Secondary | ICD-10-CM | POA: Insufficient documentation

## 2023-09-12 DIAGNOSIS — J4489 Other specified chronic obstructive pulmonary disease: Secondary | ICD-10-CM | POA: Diagnosis not present

## 2023-09-12 DIAGNOSIS — Z794 Long term (current) use of insulin: Secondary | ICD-10-CM | POA: Diagnosis not present

## 2023-09-12 DIAGNOSIS — J449 Chronic obstructive pulmonary disease, unspecified: Secondary | ICD-10-CM | POA: Diagnosis not present

## 2023-09-12 HISTORY — PX: PARS PLANA VITRECTOMY W/ ENDOLASER PANRETINAL PHOTOCOAGULATION: SHX7338

## 2023-09-12 LAB — BASIC METABOLIC PANEL WITH GFR
Anion gap: 9 (ref 5–15)
BUN: 18 mg/dL (ref 6–20)
CO2: 26 mmol/L (ref 22–32)
Calcium: 9.5 mg/dL (ref 8.9–10.3)
Chloride: 100 mmol/L (ref 98–111)
Creatinine, Ser: 0.75 mg/dL (ref 0.44–1.00)
GFR, Estimated: >60 mL/min (ref >60)
Glucose, Bld: 215 mg/dL — ABNORMAL HIGH (ref 70–99)
Potassium: 3.7 mmol/L (ref 3.5–5.1)
Sodium: 135 mmol/L (ref 135–145)

## 2023-09-12 LAB — GLUCOSE, CAPILLARY
Glucose-Capillary: 191 mg/dL — ABNORMAL HIGH (ref 70–99)
Glucose-Capillary: 189 mg/dL — ABNORMAL HIGH (ref 70–99)
Glucose-Capillary: 155 mg/dL — ABNORMAL HIGH (ref 70–99)

## 2023-09-12 LAB — CBC
HCT: 40.2 % (ref 36.0–46.0)
Hemoglobin: 14 g/dL (ref 12.0–15.0)
MCH: 33.3 pg (ref 26.0–34.0)
MCHC: 34.8 g/dL (ref 30.0–36.0)
MCV: 95.5 fL (ref 80.0–100.0)
Platelets: 175 10*3/uL (ref 150–400)
RBC: 4.21 MIL/uL (ref 3.87–5.11)
RDW: 11.8 % (ref 11.5–15.5)
WBC: 7.6 10*3/uL (ref 4.0–10.5)
nRBC: 0 % (ref 0.0–0.2)

## 2023-09-12 SURGERY — PARS PLANA VITRECTOMY WITH 25 GAUGE WITH ENDOLASER PANRETINAL PHOTOCOAGULATION
Anesthesia: Monitor Anesthesia Care | Laterality: Right

## 2023-09-12 MED ORDER — TOBRAMYCIN-DEXAMETHASONE 0.3-0.1 % OP OINT
TOPICAL_OINTMENT | OPHTHALMIC | Status: AC
  Filled 2023-09-12: qty 3.5

## 2023-09-12 MED ORDER — LIDOCAINE 2% (20 MG/ML) 5 ML SYRINGE
INTRAMUSCULAR | Status: DC | PRN
  Administered 2023-09-12: 20 mg via INTRAVENOUS

## 2023-09-12 MED ORDER — EPINEPHRINE PF 1 MG/ML IJ SOLN
INTRAMUSCULAR | Status: AC
  Filled 2023-09-12: qty 1

## 2023-09-12 MED ORDER — PHENYLEPHRINE HCL 2.5 % OP SOLN
1.0000 [drp] | OPHTHALMIC | Status: AC | PRN
  Administered 2023-09-12 (×3): 1 [drp] via OPHTHALMIC
  Filled 2023-09-12: qty 2

## 2023-09-12 MED ORDER — OFLOXACIN 0.3 % OP SOLN
1.0000 [drp] | OPHTHALMIC | Status: AC | PRN
  Administered 2023-09-12 (×3): 1 [drp] via OPHTHALMIC
  Filled 2023-09-12: qty 5

## 2023-09-12 MED ORDER — DEXAMETHASONE 0.1 % OP SUSP
OPHTHALMIC | Status: AC
  Filled 2023-09-12: qty 5

## 2023-09-12 MED ORDER — LIDOCAINE HCL (PF) 2 % IJ SOLN
INTRAMUSCULAR | Status: DC | PRN
  Administered 2023-09-12: 5 mL via OPHTHALMIC

## 2023-09-12 MED ORDER — BSS IO SOLN
INTRAOCULAR | Status: AC
  Filled 2023-09-12: qty 15

## 2023-09-12 MED ORDER — BSS PLUS IO SOLN
INTRAOCULAR | Status: AC
  Filled 2023-09-12: qty 500

## 2023-09-12 MED ORDER — ONDANSETRON HCL 4 MG/2ML IJ SOLN
INTRAMUSCULAR | Status: DC | PRN
  Administered 2023-09-12: 4 mg via INTRAVENOUS

## 2023-09-12 MED ORDER — TROPICAMIDE 1 % OP SOLN
1.0000 [drp] | OPHTHALMIC | Status: AC | PRN
  Administered 2023-09-12 (×3): 1 [drp] via OPHTHALMIC
  Filled 2023-09-12: qty 15

## 2023-09-12 MED ORDER — ATROPINE SULFATE 1 % OP SOLN
OPHTHALMIC | Status: AC
  Filled 2023-09-12: qty 5

## 2023-09-12 MED ORDER — PROPARACAINE HCL 0.5 % OP SOLN
1.0000 [drp] | OPHTHALMIC | Status: AC | PRN
  Administered 2023-09-12 (×3): 1 [drp] via OPHTHALMIC
  Filled 2023-09-12: qty 15

## 2023-09-12 MED ORDER — BSS IO SOLN
INTRAOCULAR | Status: DC | PRN
  Administered 2023-09-12: 500 mL via INTRAOCULAR

## 2023-09-12 MED ORDER — BSS PLUS IO SOLN
INTRAOCULAR | Status: DC | PRN
  Administered 2023-09-12: 1 via INTRAOCULAR

## 2023-09-12 MED ORDER — DEXAMETHASONE SODIUM PHOSPHATE 10 MG/ML IJ SOLN
INTRAMUSCULAR | Status: AC
  Filled 2023-09-12: qty 1

## 2023-09-12 MED ORDER — EPINEPHRINE 1 MG/ML IJ SOLN
INTRAMUSCULAR | Status: DC | PRN
  Administered 2023-09-12: 1 mL

## 2023-09-12 MED ORDER — LIDOCAINE HCL 2 % IJ SOLN
INTRAMUSCULAR | Status: AC
  Filled 2023-09-12: qty 20

## 2023-09-12 MED ORDER — INSULIN ASPART 100 UNIT/ML IJ SOLN: 0.0000 [IU] | INTRAMUSCULAR | Status: DC | PRN

## 2023-09-12 MED ORDER — CHLORHEXIDINE GLUCONATE 0.12 % MT SOLN
OROMUCOSAL | Status: AC
  Administered 2023-09-12: 15 mL via OROMUCOSAL
  Filled 2023-09-12: qty 15

## 2023-09-12 MED ORDER — CEFAZOLIN SUBCONJUNCTIVAL INJECTION 100 MG/0.5 ML
100.0000 mg | INJECTION | SUBCONJUNCTIVAL | Status: AC
  Administered 2023-09-12: 100 mg via SUBCONJUNCTIVAL
  Filled 2023-09-12: qty 1

## 2023-09-12 MED ORDER — SODIUM HYALURONATE 10 MG/ML IO SOLUTION
PREFILLED_SYRINGE | INTRAOCULAR | Status: AC
  Filled 2023-09-12: qty 0.85

## 2023-09-12 MED ORDER — MIDAZOLAM HCL 2 MG/2ML IJ SOLN
INTRAMUSCULAR | Status: DC | PRN
  Administered 2023-09-12: 1 mg via INTRAVENOUS

## 2023-09-12 MED ORDER — BRILLIANT BLUE G 0.025 % IO SOSY
0.5000 mL | PREFILLED_SYRINGE | INTRAOCULAR | Status: DC
  Filled 2023-09-12: qty 0.5

## 2023-09-12 MED ORDER — HYALURONIDASE HUMAN 150 UNIT/ML IJ SOLN
INTRAMUSCULAR | Status: AC
  Filled 2023-09-12: qty 1

## 2023-09-12 MED ORDER — MIDAZOLAM HCL 2 MG/2ML IJ SOLN
INTRAMUSCULAR | Status: AC
  Filled 2023-09-12: qty 2

## 2023-09-12 MED ORDER — BUPIVACAINE HCL (PF) 0.75 % IJ SOLN
INTRAMUSCULAR | Status: AC
  Filled 2023-09-12: qty 10

## 2023-09-12 MED ORDER — TETRACAINE HCL 0.5 % OP SOLN
OPHTHALMIC | Status: AC
  Filled 2023-09-12: qty 4

## 2023-09-12 MED ORDER — PHENYLEPHRINE 80 MCG/ML (10ML) SYRINGE FOR IV PUSH (FOR BLOOD PRESSURE SUPPORT)
PREFILLED_SYRINGE | INTRAVENOUS | Status: DC | PRN
  Administered 2023-09-12: 80 ug via INTRAVENOUS

## 2023-09-12 MED ORDER — PROPOFOL 10 MG/ML IV BOLUS
INTRAVENOUS | Status: DC | PRN
  Administered 2023-09-12: 30 mg via INTRAVENOUS
  Administered 2023-09-12: 25 ug/kg/min via INTRAVENOUS

## 2023-09-12 MED ORDER — CHLORHEXIDINE GLUCONATE 0.12 % MT SOLN: 15.0000 mL | OROMUCOSAL | Status: AC

## 2023-09-12 MED ORDER — TOBRAMYCIN-DEXAMETHASONE 0.3-0.1 % OP OINT
TOPICAL_OINTMENT | OPHTHALMIC | Status: DC | PRN
  Administered 2023-09-12: 1 via OPHTHALMIC

## 2023-09-12 MED ORDER — SODIUM CHLORIDE 0.9 % IV SOLN: INTRAVENOUS | Status: DC

## 2023-09-12 MED ORDER — MIDAZOLAM HCL 5 MG/5ML IJ SOLN: INTRAMUSCULAR | Status: DC | PRN

## 2023-09-12 SURGICAL SUPPLY — 54 items
APPLICATOR COTTON TIP 6 STRL (MISCELLANEOUS) ×2 IMPLANT
BAND WRIST GAS GREEN (MISCELLANEOUS) IMPLANT
BLADE MVR KNIFE 20G (BLADE) IMPLANT
BLADE STAB KNIFE 15DEG (BLADE) IMPLANT
CABLE BIPOLOR RESECTION CORD (MISCELLANEOUS) IMPLANT
CANNULA ANT CHAM MAIN (OPHTHALMIC RELATED) IMPLANT
CANNULA DUAL BORE 23G (CANNULA) IMPLANT
CANNULA DUALBORE 25G (CANNULA) IMPLANT
CANNULA VLV SOFT TIP 25G (OPHTHALMIC) ×2 IMPLANT
CANNULA VLV SOFT TIP 25GA (OPHTHALMIC) ×1 IMPLANT
CAUTERY EYE LOW TEMP 1300F FIN (OPHTHALMIC RELATED) IMPLANT
CLSR STERI-STRIP ANTIMIC 1/2X4 (GAUZE/BANDAGES/DRESSINGS) ×2 IMPLANT
COVER MAYO STAND STRL (DRAPES) IMPLANT
DRAPE HALF SHEET 40X57 (DRAPES) ×2 IMPLANT
DRAPE INCISE 51X51 W/FILM STRL (DRAPES) ×2 IMPLANT
DRAPE RETRACTOR (MISCELLANEOUS) ×2 IMPLANT
ERASER HMR WETFIELD 23G BP (MISCELLANEOUS) IMPLANT
FORCEPS ECKARDT ILM 25G SERR (OPHTHALMIC RELATED) IMPLANT
FORCEPS GRIESHABER ILM 25G A (INSTRUMENTS) IMPLANT
GAS AUTO FILL CONSTELLATION (OPHTHALMIC) IMPLANT
GLOVE SURG SYN 7.5 E (GLOVE) ×1 IMPLANT
GLOVE SURG SYN 7.5 PF PI (GLOVE) ×2 IMPLANT
GOWN STRL REUS W/ TWL LRG LVL3 (GOWN DISPOSABLE) ×4 IMPLANT
KIT BASIN OR (CUSTOM PROCEDURE TRAY) ×2 IMPLANT
KIT TURNOVER KIT B (KITS) ×2 IMPLANT
LENS BIOM SUPER VIEW SET DISP (MISCELLANEOUS) ×2 IMPLANT
NDL 18GX1X1/2 (RX/OR ONLY) (NEEDLE) ×2 IMPLANT
NDL 25GX 5/8IN NON SAFETY (NEEDLE) ×2 IMPLANT
NDL 27GX1/2 REG BEVEL ECLIP (NEEDLE) ×2 IMPLANT
NDL FILTER BLUNT 18X1 1/2 (NEEDLE) ×2 IMPLANT
NDL HYPO 30X.5 LL (NEEDLE) ×4 IMPLANT
NDL RETROBULBAR 25GX1.5 (NEEDLE) ×2 IMPLANT
NEEDLE 18GX1X1/2 (RX/OR ONLY) (NEEDLE) ×1 IMPLANT
NEEDLE 25GX 5/8IN NON SAFETY (NEEDLE) ×1 IMPLANT
NEEDLE 27GX1/2 REG BEVEL ECLIP (NEEDLE) ×1 IMPLANT
NEEDLE FILTER BLUNT 18X1 1/2 (NEEDLE) ×1 IMPLANT
NEEDLE HYPO 30X.5 LL (NEEDLE) ×2 IMPLANT
NEEDLE RETROBULBAR 25GX1.5 (NEEDLE) ×1 IMPLANT
NS IRRIG 1000ML POUR BTL (IV SOLUTION) ×2 IMPLANT
PACK FRAGMATOME (OPHTHALMIC) IMPLANT
PACK VITRECTOMY CUSTOM (CUSTOM PROCEDURE TRAY) ×2 IMPLANT
PAD ARMBOARD POSITIONER FOAM (MISCELLANEOUS) ×4 IMPLANT
PAK PIK VITRECTOMY CVS 25GA (OPHTHALMIC) ×2 IMPLANT
PROBE ENDO DIATHERMY 25G (MISCELLANEOUS) ×2 IMPLANT
PROBE LASER ILLUM FLEX CVD 25G (OPHTHALMIC) IMPLANT
SCRAPER DIAMOND 25GA (OPHTHALMIC RELATED) IMPLANT
SET INJECTOR OIL FLUID CONSTEL (OPHTHALMIC) IMPLANT
SOL ANTI FOG 6CC (MISCELLANEOUS) ×2 IMPLANT
STOPCOCK 4 WAY LG BORE MALE ST (IV SETS) IMPLANT
SUT VICRYL 7 0 TG140 8 (SUTURE) ×2 IMPLANT
SUT VICRYL 8 0 TG140 8 (SUTURE) IMPLANT
SYR 10ML LL (SYRINGE) IMPLANT
SYR TB 1ML LUER SLIP (SYRINGE) ×4 IMPLANT
WATER STERILE IRR 1000ML POUR (IV SOLUTION) ×2 IMPLANT

## 2023-09-12 NOTE — H&P (Signed)
 Date of examination:  09/12/2023  Indication for surgery: retained silicone oil right eye  Pertinent past medical history:  Past Medical History:  Diagnosis Date   Asthma    COPD (chronic obstructive pulmonary disease) (HCC)    DDD (degenerative disc disease), lumbar    Depression    Diabetes mellitus    Gastroparesis    GERD without esophagitis    High cholesterol    Hypertension    Hypothyroidism    PONV (postoperative nausea and vomiting)    PVD (peripheral vascular disease) (HCC)    Wears glasses     Pertinent ocular history:  proliferative diabetic retinopathy  Pertinent family history:  Family History  Problem Relation Age of Onset   Stroke Mother    Hypertension Mother    Heart failure Father    Asthma Father    Diabetes Father    Heart disease Father    Emphysema Paternal Grandfather    Colon cancer Neg Hx    Colon polyps Neg Hx     General:  Healthy appearing patient in no distress.     Eyes:    Acuity OD 20/400    External: Within normal limits      Anterior segment: Within normal limits         Fundus: Retained silicone oil right eye        Impression: Retained silicone oil right eye  Plan:  Vitrectomy with endolaser right eye  Onesimo Blanch, MD

## 2023-09-12 NOTE — Transfer of Care (Signed)
 Immediate Anesthesia Transfer of Care Note  Patient: Alexandria Webster  Procedure(s) Performed: PARS PLANA VITRECTOMY WITH 25 GAUGE WITH ENDOLASER PANRETINAL PHOTOCOAGULATION (Right)  Patient Location: PACU  Anesthesia Type:MAC  Level of Consciousness: awake, alert , and oriented  Airway & Oxygen  Therapy: Patient Spontanous Breathing  Post-op Assessment: Report given to RN and Post -op Vital signs reviewed and stable  Post vital signs: Reviewed and stable  Last Vitals:  Vitals Value Taken Time  BP 120/75 09/12/23 17:46  Temp    Pulse 64 09/12/23 17:46  Resp 11 09/12/23 17:46  SpO2 99 % 09/12/23 17:46  Vitals shown include unfiled device data.  Last Pain:  Vitals:   09/12/23 1336  TempSrc:   PainSc: 0-No pain      Patients Stated Pain Goal: 0 (09/12/23 1336)  Complications: There were no known notable events for this encounter.

## 2023-09-12 NOTE — Discharge Instructions (Addendum)
DO NOT SLEEP ON BACK, THE EYE PRESSURE CAN GO UP AND CAUSE VISION LOSS   SLEEP ON SIDE WITH NOSE TO PILLOW  DURING DAY KEEP UPRIGHT 

## 2023-09-12 NOTE — Brief Op Note (Signed)
 09/12/2023  5:41 PM  PATIENT:  Alexandria Webster  58 y.o. female  PRE-OPERATIVE DIAGNOSIS:  History of tractional retinal detachment and vitreous hemorrhage with retained silicone oil and silicone oil breakdown right eye  POST-OPERATIVE DIAGNOSIS:  History of tractional retinal detachment and vitreous hemorrhage with retained silicone oil and silicone oil breakdown right eye  PROCEDURE:  Procedure(s) with comments: PARS PLANA VITRECTOMY WITH 25 GAUGE WITH ENDOLASER PANRETINAL PHOTOCOAGULATION (Right) - MAC WITH BLOCK  SURGEON:  Surgeons and Role:    * Tobie Baptist, MD - Primary  PHYSICIAN ASSISTANT:   ASSISTANTS: none   ANESTHESIA:   local and MAC  EBL:  minimal   BLOOD ADMINISTERED:none  DRAINS: none   LOCAL MEDICATIONS USED:  MARCAINE     and LIDOCAINE    SPECIMEN:  No Specimen  DISPOSITION OF SPECIMEN:  N/A  COUNTS:  YES  TOURNIQUET:  * No tourniquets in log *  DICTATION: .Note written in EPIC  PLAN OF CARE: Discharge to home after PACU  PATIENT DISPOSITION:  PACU - hemodynamically stable.   Delay start of Pharmacological VTE agent (>24hrs) due to surgical blood loss or risk of bleeding: yes

## 2023-09-13 ENCOUNTER — Encounter (HOSPITAL_COMMUNITY): Payer: Self-pay | Admitting: Ophthalmology

## 2023-09-16 NOTE — Anesthesia Postprocedure Evaluation (Signed)
 Anesthesia Post Note  Patient: Alexandria Webster  Procedure(s) Performed: PARS PLANA VITRECTOMY WITH 25 GAUGE WITH ENDOLASER PANRETINAL PHOTOCOAGULATION (Right)     Patient location during evaluation: PACU Anesthesia Type: MAC Level of consciousness: awake and patient cooperative Pain management: pain level controlled Vital Signs Assessment: post-procedure vital signs reviewed and stable Respiratory status: spontaneous breathing, nonlabored ventilation and respiratory function stable Cardiovascular status: stable and blood pressure returned to baseline Postop Assessment: no apparent nausea or vomiting Anesthetic complications: no   There were no known notable events for this encounter.                  Ellia Knowlton

## 2023-09-19 ENCOUNTER — Other Ambulatory Visit: Payer: Self-pay | Admitting: *Deleted

## 2023-09-19 DIAGNOSIS — Z7984 Long term (current) use of oral hypoglycemic drugs: Secondary | ICD-10-CM

## 2023-09-19 DIAGNOSIS — E119 Type 2 diabetes mellitus without complications: Secondary | ICD-10-CM

## 2023-09-19 MED ORDER — TRULICITY 1.5 MG/0.5ML ~~LOC~~ SOAJ: 1.5000 mg | SUBCUTANEOUS | 0 refills | Status: DC

## 2023-09-22 ENCOUNTER — Other Ambulatory Visit: Payer: Self-pay | Admitting: Gastroenterology

## 2023-09-23 DIAGNOSIS — Z419 Encounter for procedure for purposes other than remedying health state, unspecified: Secondary | ICD-10-CM | POA: Diagnosis not present

## 2023-09-27 DIAGNOSIS — H2521 Age-related cataract, morgagnian type, right eye: Secondary | ICD-10-CM | POA: Diagnosis not present

## 2023-09-27 DIAGNOSIS — E113511 Type 2 diabetes mellitus with proliferative diabetic retinopathy with macular edema, right eye: Secondary | ICD-10-CM | POA: Diagnosis not present

## 2023-09-27 DIAGNOSIS — H3582 Retinal ischemia: Secondary | ICD-10-CM | POA: Diagnosis not present

## 2023-09-27 DIAGNOSIS — E113512 Type 2 diabetes mellitus with proliferative diabetic retinopathy with macular edema, left eye: Secondary | ICD-10-CM | POA: Diagnosis not present

## 2023-09-28 LAB — COMPREHENSIVE METABOLIC PANEL WITH GFR
ALT: 13 IU/L (ref 0–32)
AST: 18 IU/L (ref 0–40)
Albumin: 4.1 g/dL (ref 3.8–4.9)
Alkaline Phosphatase: 78 IU/L (ref 44–121)
BUN/Creatinine Ratio: 26 — ABNORMAL HIGH (ref 9–23)
BUN: 24 mg/dL (ref 6–24)
Bilirubin Total: 0.3 mg/dL (ref 0.0–1.2)
CO2: 20 mmol/L (ref 20–29)
Calcium: 9.9 mg/dL (ref 8.7–10.2)
Chloride: 98 mmol/L (ref 96–106)
Creatinine, Ser: 0.91 mg/dL (ref 0.57–1.00)
Globulin, Total: 2.9 g/dL (ref 1.5–4.5)
Glucose: 252 mg/dL — ABNORMAL HIGH (ref 70–99)
Potassium: 4.7 mmol/L (ref 3.5–5.2)
Sodium: 138 mmol/L (ref 134–144)
Total Protein: 7 g/dL (ref 6.0–8.5)
eGFR: 73 mL/min/1.73 (ref >59)

## 2023-09-28 LAB — THYROGLOBULIN ANTIBODY: Thyroglobulin Antibody: <1 [IU]/mL (ref 0.0–0.9)

## 2023-09-28 LAB — THYROID PEROXIDASE ANTIBODY: Thyroperoxidase Ab SerPl-aCnc: 11 [IU]/mL (ref 0–34)

## 2023-09-28 LAB — TSH: TSH: 3.33 u[IU]/mL (ref 0.450–4.500)

## 2023-09-28 LAB — T4, FREE: Free T4: 1.49 ng/dL (ref 0.82–1.77)

## 2023-10-01 NOTE — Op Note (Signed)
 Alexandria Webster 09/12/2023 Diagnosis: retained silicone oil right eye  Procedure: Pars Plana Vitrectomy, Endolaser, and removal of silicone oil  Operative Eye:  right eye  Surgeon: Onesimo Brendolyn Blanch Estimated Blood Loss: minimal Specimens for Pathology:  None Complications: none   The  patient was prepped and draped in the usual fashion for ocular surgery on the  right eye .  A lid speculum was placed.  Infusion line and trocar was placed at the 8 o'clock position approximately 3.5 mm from the surgical limbus.   The infusion line was allowed to run and then clamped when placed at the cannula opening. The line was inserted and secured to the drape with an adhesive strip.   Active trocars/cannula were placed at the 10 and 2 o'clock positions approximately 3.5 mm from the surgical limbus. The cannula was visualized in the vitreous cavity.  The viscous fluid extraction tip was inserted in the eye and the silicone oil removed.  Silicone oil was additionally removed with the vitrector.  3 rows of endolaser were applied 360 degrees to the periphery.  A partial air-fluid exchange was performed.  The superior cannulas were sequentially removed with concommitant tamponade using a cotton tipped applicator and noted to be air tight.  The infusion line and trocar were removed and the sclerotomy was noted to be air tight with normal intraocular pressure by digital palpapation.  Subconjunctival injections of antibiotic and Dexamethasone  4mg /50ml was placed in the infero-medial quadrant.   The speculum and drapes were removed and the eye was patched with Polymixin/Bacitracin  ophthalmic ointment. An eye shield was placed and the patient was transferred alert and conversant with stable vital signs to the post operative recovery area.  The patient tolerated the procedure well and no complications were noted.  Onesimo Brendolyn Blanch MD

## 2023-10-02 NOTE — Therapy (Deleted)
 OUTPATIENT PHYSICAL THERAPY LOWER EXTREMITY EVALUATION   Patient Name: Alexandria Webster MRN: [id number] DOB:Jul 14, 1965, 58 y.o., female Today's Date: 10/02/2023  END OF SESSION:   Past Medical History:  Diagnosis Date   Asthma    COPD (chronic obstructive pulmonary disease) (HCC)    DDD (degenerative disc disease), lumbar    Depression    Diabetes mellitus    Gastroparesis    GERD without esophagitis    High cholesterol    Hypertension    Hypothyroidism    PONV (postoperative nausea and vomiting)    PVD (peripheral vascular disease) (HCC)    Wears glasses    Past Surgical History:  Procedure Laterality Date   AIR/FLUID EXCHANGE Right 11/22/2022   Procedure: AIR/FLUID EXCHANGE;  Surgeon: Tobie Baptist, MD;  Location: [facility] OR;  Service: Ophthalmology;  Laterality: Right;   AORTA - BILATERAL FEMORAL ARTERY BYPASS GRAFT N/A 09/27/2018   Procedure: Redo Exposure  AORTOBIFEMORAL Bypass and bilateral femoral Artery ; Redo Aortobifemoral  BYPASS GRAFT.;  Surgeon: Gretta Lonni PARAS, MD;  Location: [facility] OR;  Service: Vascular;  Laterality: N/A;   BACK SURGERY     BIOPSY  10/14/2021   Procedure: BIOPSY;  Surgeon: Cindie Carlin POUR, DO;  [facility]: [facility];  Service: Endoscopy;;   CATARACT EXTRACTION EXTRACAPSULAR Left 04/13/2023   Procedure: CATARACT EXTRACTION EXTRACAPSULAR WITH INTRAOCULAR LENS PLACEMENT (IOC);  Surgeon: Tobie Baptist, MD;  Location: [facility] OR;  Service: Ophthalmology;  Laterality: Left;  MAC WITH BLOCK   COLONOSCOPY WITH PROPOFOL  N/A 10/14/2021   Procedure: COLONOSCOPY WITH PROPOFOL ;  Surgeon: Cindie Carlin POUR, DO;  [facility]: [facility];  Service: Endoscopy;  Laterality: N/A;  8:30am   ESOPHAGOGASTRODUODENOSCOPY (EGD) WITH PROPOFOL  N/A 10/14/2021   Procedure: ESOPHAGOGASTRODUODENOSCOPY (EGD) WITH PROPOFOL ;  Surgeon: Cindie Carlin POUR, DO;  [facility]: [facility];  Service: Endoscopy;  Laterality: N/A;   FEMORAL ARTERY STENT  right leg   INJECTION OF SILICONE  OIL Left 09/06/2022   Procedure: INJECTION OF SILICONE OIL;  Surgeon: Tobie Baptist, MD;  Location: [facility] OR;  Service: Ophthalmology;  Laterality: Left;   INJECTION OF SILICONE OIL Right 11/22/2022   Procedure: INJECTION OF SILICONE OIL;  Surgeon: Tobie Baptist, MD;  Location: [facility] OR;  Service: Ophthalmology;  Laterality: Right;   MEMBRANE PEEL Left 09/06/2022   Procedure: MEMBRANECTOMY;  Surgeon: Tobie Baptist, MD;  Location: [facility] OR;  Service: Ophthalmology;  Laterality: Left;   PARS PLANA VITRECTOMY Left 09/06/2022   Procedure: PARS PLANA VITRECTOMY WITH 25 GAUGE;  Surgeon: Tobie Baptist, MD;  Location: [facility] OR;  Service: Ophthalmology;  Laterality: Left;   PARS PLANA VITRECTOMY Left 02/14/2023   Procedure: PARS PLANA VITRECTOMY WITH 25 GAUGE;  Surgeon: Tobie Baptist, MD;  Location: [facility] OR;  Service: Ophthalmology;  Laterality: Left;   PARS PLANA VITRECTOMY W/ ENDOLASER PANRETINAL PHOTOCOAGULATION Right 09/12/2023   Procedure: PARS PLANA VITRECTOMY WITH 25 GAUGE WITH ENDOLASER PANRETINAL PHOTOCOAGULATION;  Surgeon: Tobie Baptist, MD;  Location: [facility] OR;  Service: Ophthalmology;  Laterality: Right;  MAC WITH BLOCK   PHOTOCOAGULATION WITH LASER Left 09/06/2022   Procedure: PHOTOCOAGULATION WITH LASER;  Surgeon: Tobie Baptist, MD;  Location: [facility] OR;  Service: Ophthalmology;  Laterality: Left;   PHOTOCOAGULATION WITH LASER Right 11/22/2022   Procedure: PHOTOCOAGULATION WITH LASER;  Surgeon: Tobie Baptist, MD;  Location: [facility] OR;  Service: Ophthalmology;  Laterality: Right;   PHOTOCOAGULATION WITH LASER Left 02/14/2023   Procedure: PHOTOCOAGULATION WITH LASER;  Surgeon: Tobie Baptist, MD;  Location: [facility] OR;  Service: Ophthalmology;  Laterality:  Left;   POSTERIOR CERVICAL FUSION/FORAMINOTOMY N/A 07/19/2022   Procedure: Posterior cervical fusion with lateral mass fixation - Cervical four-cervical five, Cervical five-Cervical six - Cervical six-Cervical seven - Cervical seven-Thoracic one with laminectomy;   Surgeon: Louis Shove, MD;  Location: [facility] OR;  Service: Neurosurgery;  Laterality: N/A;   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   Procedure: REPAIR OF COMPLEX TRACTION RETINAL DETACHMENT;  Surgeon: Tobie Baptist, MD;  Location: [facility] OR;  Service: Ophthalmology;  Laterality: Left;  MAC WITH BLOCK   REPAIR OF COMPLEX TRACTION RETINAL DETACHMENT Right 11/22/2022   Procedure: REPAIR OF COMPLEX TRACTION RETINAL DETACHMENT;  Surgeon: Tobie Baptist, MD;  Location: [facility] OR;  Service: Ophthalmology;  Laterality: Right;  MAC WITH BLOCK   SILICON OIL REMOVAL Left 02/14/2023   Procedure: SILICON OIL REMOVAL;  Surgeon: Tobie Baptist, MD;  Location: [facility] OR;  Service: Ophthalmology;  Laterality: Left;   TUBAL LIGATION     Patient Active Problem List   Diagnosis Date Noted   Spasticity 08/25/2023   Post-operative pain 08/15/2022   Orthostatic hypotension 08/15/2022   Impaired gait and mobility 08/15/2022   Mild cognitive impairment 08/01/2022   Cervical myelopathy (HCC) 07/22/2022   Stenosis of cervical spine with myelopathy (HCC) 07/19/2022   Degeneration of cervical intervertebral disc 08/08/2021   Syncope and collapse 08/06/2021   Hypomagnesemia 08/06/2021   Hypothyroidism 08/06/2021   Allergic rhinitis 08/06/2021   AKI (acute kidney injury) (HCC) 08/06/2021   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/27/2018   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   Gastritis 10/01/2015   Gastroparesis due to DM (HCC) 10/01/2015   Personal history of noncompliance with medical treatment, presenting hazards to health 09/14/2015   Hypokalemia 11/13/2012   Constipation 11/09/2012   Facial cellulitis 11/07/2012   Leukocytosis 11/07/2012   Type 2 diabetes mellitus (HCC) 11/07/2012   Asthma 11/07/2012   PVD (peripheral vascular disease) (HCC) 11/07/2012   Hyponatremia 11/07/2012   Tachycardia 11/07/2012   Nausea & vomiting  11/07/2012   Essential hypertension, benign     PCP: Marvine Rush, MD  REFERRING PROVIDER: Cornelio Bouchard, MD  REFERRING DIAG: G95.9 (ICD-10-CM) - Cervical myelopathy (HCC) R26.89 (ICD-10-CM) - Impaired gait and mobility R25.2 (ICD-10-CM) - Spasticity  THERAPY DIAG:  No diagnosis found.  Rationale for Evaluation and Treatment: Rehabilitation  ONSET DATE: ***  SUBJECTIVE:   SUBJECTIVE STATEMENT: ***  PERTINENT HISTORY: hx of cervical myelopathy s/p C5-T1 decompression lamintomy and PCDF 07/19/22 by Dr Louis.  PAIN:  Are you having pain? {OPRCPAIN:27236}  PRECAUTIONS: None  RED FLAGS: {PT Red Flags:29287}   WEIGHT BEARING RESTRICTIONS: No  FALLS:  Has patient fallen in last 6 months? {fallsyesno:27318}  LIVING ENVIRONMENT: Lives with: {OPRC lives with:25569::lives with their family} Lives in: {Lives in:25570} Stairs: {opstairs:27293} Has following equipment at home: {Assistive devices:23999}  OCCUPATION: ***  PLOF: {PLOF:24004}  PATIENT GOALS: ***  NEXT MD VISIT: ***  OBJECTIVE:  Note: Objective measures were completed at Evaluation unless otherwise noted.  DIAGNOSTIC FINDINGS:  IMPRESSION: 1. Multilevel cervical spondylosis with resultant diffuse spinal stenosis, severe in nature at C5-6 and C6-7. Patchy cord signal changes at these levels consistent with myelomalacia. Neurosurgical/spine surgery consultation recommended. 2. Question additional subtle cord signal abnormality at the level of C3-4, also suspicious for myelomalacia. 3. Multifactorial degenerative changes with resultant multilevel foraminal narrowing as above. Notable findings include severe bilateral C4 foraminal stenosis, moderate left with severe right C5 foraminal narrowing, severe bilateral C6 and  C7 foraminal stenosis, with moderate left C8 foraminal narrowing.    PATIENT SURVEYS:  ABC scale: The Activities-Specific Balance Confidence (ABC) Scale 0% 10 20 30  40 50 60 70 80 90  100% No confidence<->completely confident  "How confident are you that you will not lose your balance or become unsteady when you . . .   Date tested ***  Walk around the house ***%  2. Walk up or down stairs ***%  3. Bend over and pick up a slipper from in front of a closet floor ***%  4. Reach for a small can off a shelf at eye level ***%  5. Stand on tip toes and reach for something above your head ***%  6. Stand on a chair and reach for something ***%  7. Sweep the floor ***%  8. Walk outside the house to a car parked in the driveway ***%  9. Get into or out of a car ***%  10. Walk across a parking lot to the mall ***%  11. Walk up or down a ramp ***%  12. Walk in a crowded mall where people rapidly walk past you ***%  13. Are bumped into by people as you walk through the mall ***%  14. Step onto or off of an escalator while you are holding onto the railing ***%  15. Step onto or off an escalator while holding onto parcels such that you cannot hold onto the railing ***%  16. Walk outside on icy sidewalks ***%  Total: #/16 ***    LEFS  Extreme difficulty/unable (0), Quite a bit of difficulty (1), Moderate difficulty (2), Little difficulty (3), No difficulty (4) Survey date:    Any of your usual work, housework or school activities   2. Usual hobbies, recreational or sporting activities   3. Getting into/out of the bath   4. Walking between rooms   5. Putting on socks/shoes   6. Squatting    7. Lifting an object, like a bag of groceries from the floor   8. Performing light activities around your home   9. Performing heavy activities around your home   10. Getting into/out of a car   11. Walking 2 blocks   12. Walking 1 mile   13. Going up/down 10 stairs (1 flight)   14. Standing for 1 hour   15.  sitting for 1 hour   16. Running on even ground   17. Running on uneven ground   18. Making sharp turns while running fast   19. Hopping    20. Rolling over in bed   Score  total:  ***     COGNITION: Overall cognitive status: {cognition:24006}     SENSATION: {sensation:27233}  EDEMA:  {edema:24020}  MUSCLE LENGTH: Hamstrings: Right *** deg; Left *** deg Debby test: Right *** deg; Left *** deg  POSTURE: {posture:25561}  PALPATION: ***  LOWER EXTREMITY ROM:  {AROM/PROM:27142} ROM Right eval Left eval  Hip flexion    Hip extension    Hip abduction    Hip adduction    Hip internal rotation    Hip external rotation    Knee flexion    Knee extension    Ankle dorsiflexion    Ankle plantarflexion    Ankle inversion    Ankle eversion     (Blank rows = not tested)  LOWER EXTREMITY MMT:  MMT Right eval Left eval  Hip flexion    Hip extension    Hip abduction    Hip  adduction    Hip internal rotation    Hip external rotation    Knee flexion    Knee extension    Ankle dorsiflexion    Ankle plantarflexion    Ankle inversion    Ankle eversion     (Blank rows = not tested)  LOWER EXTREMITY SPECIAL TESTS:  {LEspecialtests:26242}  FUNCTIONAL TESTS:  {Functional tests:24029}  GAIT: Distance walked: *** Assistive device utilized: {Assistive devices:23999} Level of assistance: {Levels of assistance:24026} Comments: ***                                                                                                                                TREATMENT DATE:  10/04/23: PT eval and HEP    PATIENT EDUCATION:  Education details: PT evaluation, objective findings, POC, Importance of HEP, Precautions, Clinic policies  Person educated: Patient Education method: Explanation and Demonstration Education comprehension: verbalized understanding and returned demonstration  HOME EXERCISE PROGRAM: ***  ASSESSMENT:  CLINICAL IMPRESSION: Patient is a 58 y.o. female who was seen today for physical therapy evaluation and treatment for G95.9 (ICD-10-CM) - Cervical myelopathy (HCC) R26.89 (ICD-10-CM) - Impaired gait and mobility R25.2  (ICD-10-CM) - Spasticity.   OBJECTIVE IMPAIRMENTS: {opptimpairments:25111}.   ACTIVITY LIMITATIONS: {activitylimitations:27494}  PARTICIPATION LIMITATIONS: {participationrestrictions:25113}  PERSONAL FACTORS: {Personal factors:25162} are also affecting patient's functional outcome.   REHAB POTENTIAL: {rehabpotential:25112}  CLINICAL DECISION MAKING: {clinical decision making:25114}  EVALUATION COMPLEXITY: {Evaluation complexity:25115}   GOALS: Goals reviewed with patient? {yes/no:20286}  SHORT TERM GOALS: Target date: 10/25/23 Patient will be independent with performance of HEP to demonstrate adequate self management of symptoms.  Baseline:  Goal status: INITIAL  2.   Patient will report at least a 25% improvement with function or pain overall since beginning PT. Baseline:  Goal status: INITIAL   LONG TERM GOALS: Target date: 11/29/23  *** Baseline:  Goal status: INITIAL  2.  *** Baseline:  Goal status: INITIAL  3.  *** Baseline:  Goal status: INITIAL  4.  *** Baseline:  Goal status: INITIAL  5.  *** Baseline:  Goal status: INITIAL  6.  *** Baseline:  Goal status: INITIAL   PLAN:  PT FREQUENCY: 2x/week  PT DURATION: 8 weeks  PLANNED INTERVENTIONS: 97164- PT Re-evaluation, 97110-Therapeutic exercises, 97530- Therapeutic activity, 97112- Neuromuscular re-education, 97535- Self Care, 02859- Manual therapy, [id number]- Gait training, [phone]- Electrical stimulation (manual), [id number]- Traction (mechanical), 20560 (1-2 muscles), 20561 (3+ muscles)- Dry Needling, Patient/Family education, Balance training, Stair training, Taping, Joint mobilization, Spinal mobilization, DME instructions, Wheelchair mobility training, Cryotherapy, and Moist heat  PLAN FOR NEXT SESSION: ***  2:07 PM, 10/02/23 Rayane Gallardo Powell-Butler, PT, DPT [facility]    Managed Medicaid Authorization Request Treatment Start Date: ***  Visit Dx Codes: ***  Functional  Tool Score: ***  For all possible CPT codes, reference the Planned Interventions line above.     Check all conditions that are expected to impact treatment: {Conditions expected to  impact treatment:{Conditions expected to impact treatment:28273}   If treatment provided at initial evaluation, no treatment charged due to lack of authorization.       Managed Medicaid Authorization Request Treatment Start Date: ***  Visit Dx Codes: ***  Functional Tool Score: ***  For all possible CPT codes, reference the Planned Interventions line above.     Check all conditions that are expected to impact treatment: {Conditions expected to impact treatment:{Conditions expected to impact treatment:28273}   If treatment provided at initial evaluation, no treatment charged due to lack of authorization.

## 2023-10-03 ENCOUNTER — Ambulatory Visit: Admitting: Nurse Practitioner

## 2023-10-04 ENCOUNTER — Ambulatory Visit (HOSPITAL_COMMUNITY)

## 2023-10-06 ENCOUNTER — Ambulatory Visit (INDEPENDENT_AMBULATORY_CARE_PROVIDER_SITE_OTHER): Admitting: Nurse Practitioner

## 2023-10-06 ENCOUNTER — Encounter: Payer: Self-pay | Admitting: Nurse Practitioner

## 2023-10-06 VITALS — BP 122/72 | HR 74 | Ht 64.0 in | Wt 104.0 lb

## 2023-10-06 DIAGNOSIS — E782 Mixed hyperlipidemia: Secondary | ICD-10-CM

## 2023-10-06 DIAGNOSIS — Z7984 Long term (current) use of oral hypoglycemic drugs: Secondary | ICD-10-CM | POA: Diagnosis not present

## 2023-10-06 DIAGNOSIS — E119 Type 2 diabetes mellitus without complications: Secondary | ICD-10-CM

## 2023-10-06 DIAGNOSIS — Z7985 Long-term (current) use of injectable non-insulin antidiabetic drugs: Secondary | ICD-10-CM | POA: Diagnosis not present

## 2023-10-06 DIAGNOSIS — E039 Hypothyroidism, unspecified: Secondary | ICD-10-CM

## 2023-10-06 MED ORDER — TRULICITY 1.5 MG/0.5ML ~~LOC~~ SOAJ: 1.5000 mg | SUBCUTANEOUS | 3 refills | Status: AC

## 2023-10-06 MED ORDER — LEVOTHYROXINE SODIUM 50 MCG PO TABS: 50.0000 ug | ORAL_TABLET | Freq: Every day | ORAL | 3 refills | Status: AC

## 2023-10-06 NOTE — Patient Instructions (Signed)

## 2023-10-06 NOTE — Progress Notes (Signed)
 Endocrinology Follow Up Note       10/06/2023, 9:31 AM   Subjective:    Patient ID: Alexandria Webster, female    DOB: 1965/03/25.  Alexandria Webster is being seen in follow up after being seen in consultation for management of currently uncontrolled symptomatic diabetes requested by  Marvine Rush, MD.   Past Medical History:  Diagnosis Date   Asthma    COPD (chronic obstructive pulmonary disease) (HCC)    DDD (degenerative disc disease), lumbar    Depression    Diabetes mellitus    Gastroparesis    GERD without esophagitis    High cholesterol    Hypertension    Hypothyroidism    PONV (postoperative nausea and vomiting)    PVD (peripheral vascular disease) (HCC)    Wears glasses     Past Surgical History:  Procedure Laterality Date   AIR/FLUID EXCHANGE Right 11/22/2022   Procedure: AIR/FLUID EXCHANGE;  Surgeon: Tobie Baptist, MD;  Location: [facility] OR;  Service: Ophthalmology;  Laterality: Right;   AORTA - BILATERAL FEMORAL ARTERY BYPASS GRAFT N/A 09/27/2018   Procedure: Redo Exposure  AORTOBIFEMORAL Bypass and bilateral femoral Artery ; Redo Aortobifemoral  BYPASS GRAFT.;  Surgeon: Gretta Lonni PARAS, MD;  Location: [facility] OR;  Service: Vascular;  Laterality: N/A;   BACK SURGERY     BIOPSY  10/14/2021   Procedure: BIOPSY;  Surgeon: Cindie Carlin POUR, DO;  [facility];  Service: Endoscopy;;   CATARACT EXTRACTION EXTRACAPSULAR Left 04/13/2023   Procedure: CATARACT EXTRACTION EXTRACAPSULAR WITH INTRAOCULAR LENS PLACEMENT (IOC);  Surgeon: Tobie Baptist, MD;  Location: [facility] OR;  Service: Ophthalmology;  Laterality: Left;  MAC WITH BLOCK   COLONOSCOPY WITH PROPOFOL  N/A 10/14/2021   Procedure: COLONOSCOPY WITH PROPOFOL ;  Surgeon: Cindie Carlin POUR, DO;  [facility];  Service: Endoscopy;  Laterality: N/A;  8:30am   ESOPHAGOGASTRODUODENOSCOPY (EGD) WITH PROPOFOL  N/A 10/14/2021   Procedure:  ESOPHAGOGASTRODUODENOSCOPY (EGD) WITH PROPOFOL ;  Surgeon: Cindie Carlin POUR, DO;  [facility];  Service: Endoscopy;  Laterality: N/A;   FEMORAL ARTERY STENT  right leg   INJECTION OF SILICONE OIL Left 09/06/2022   Procedure: INJECTION OF SILICONE OIL;  Surgeon: Tobie Baptist, MD;  Location: [facility] OR;  Service: Ophthalmology;  Laterality: Left;   INJECTION OF SILICONE OIL Right 11/22/2022   Procedure: INJECTION OF SILICONE OIL;  Surgeon: Tobie Baptist, MD;  Location: [facility] OR;  Service: Ophthalmology;  Laterality: Right;   MEMBRANE PEEL Left 09/06/2022   Procedure: MEMBRANECTOMY;  Surgeon: Tobie Baptist, MD;  Location: [facility] OR;  Service: Ophthalmology;  Laterality: Left;   PARS PLANA VITRECTOMY Left 09/06/2022   Procedure: PARS PLANA VITRECTOMY WITH 25 GAUGE;  Surgeon: Tobie Baptist, MD;  Location: [facility] OR;  Service: Ophthalmology;  Laterality: Left;   PARS PLANA VITRECTOMY Left 02/14/2023   Procedure: PARS PLANA VITRECTOMY WITH 25 GAUGE;  Surgeon: Tobie Baptist, MD;  Location: [facility] OR;  Service: Ophthalmology;  Laterality: Left;   PARS PLANA VITRECTOMY W/ ENDOLASER PANRETINAL PHOTOCOAGULATION Right 09/12/2023   Procedure: PARS PLANA VITRECTOMY WITH 25 GAUGE WITH ENDOLASER PANRETINAL PHOTOCOAGULATION;  Surgeon: Tobie Baptist, MD;  Location: [facility] OR;  Service: Ophthalmology;  Laterality: Right;  MAC WITH BLOCK   PHOTOCOAGULATION WITH LASER Left 09/06/2022   Procedure: PHOTOCOAGULATION WITH LASER;  Surgeon: Tobie Baptist, MD;  Location: [facility] OR;  Service: Ophthalmology;  Laterality: Left;   PHOTOCOAGULATION WITH LASER Right 11/22/2022   Procedure: PHOTOCOAGULATION WITH LASER;  Surgeon: Tobie Baptist, MD;  Location: [facility] OR;  Service: Ophthalmology;  Laterality: Right;   PHOTOCOAGULATION WITH LASER Left 02/14/2023   Procedure: PHOTOCOAGULATION WITH LASER;  Surgeon: Tobie Baptist, MD;  Location: 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   Procedure: REPAIR OF COMPLEX TRACTION RETINAL DETACHMENT;  Surgeon: Tobie Baptist, MD;  Location: [facility] OR;  Service: Ophthalmology;  Laterality: Left;  MAC WITH BLOCK   REPAIR OF COMPLEX TRACTION RETINAL DETACHMENT Right 11/22/2022   Procedure: REPAIR OF COMPLEX TRACTION RETINAL DETACHMENT;  Surgeon: Tobie Baptist, MD;  Location: [facility] OR;  Service: Ophthalmology;  Laterality: Right;  MAC WITH BLOCK   SILICON OIL REMOVAL Left 02/14/2023   Procedure: SILICON OIL REMOVAL;  Surgeon: Tobie Baptist, MD;  Location: [facility] OR;  Service: Ophthalmology;  Laterality: Left;   TUBAL LIGATION      Social History   Socioeconomic History   Marital status: Single    Spouse name: Not on file   Number of children: 2   Years of education: Not on file   Highest education level: Not on file  Occupational History   Not on file  Tobacco Use   Smoking status: Former    Current packs/day: 0.00    Types: Cigarettes    Quit date: 03/14/1982    Years since quitting: 41.5    Passive exposure: Past   Smokeless tobacco: Current    Types: Snuff  Vaping Use   Vaping status: Never Used  Substance and Sexual Activity   Alcohol use: No   Drug use: No   Sexual activity: Yes    Birth control/protection: Surgical    Comment: tubal  Other Topics Concern   Not on file  Social History Narrative   Not on file   Social Drivers of Health   Financial Resource Strain: Not on file  Food Insecurity: Not on file  Transportation Needs: Not on file  Physical Activity: Not on file  Stress: Not on file  Social Connections: Not on file    Family History  Problem Relation Age  of Onset   Stroke Mother    Hypertension Mother    Heart failure Father    Asthma Father    Diabetes Father    Heart disease Father    Emphysema Paternal Grandfather    Colon cancer Neg Hx    Colon polyps Neg Hx     Outpatient Encounter Medications as of 10/06/2023  Medication Sig   albuterol  (PROVENTIL ) 2 MG tablet Take 1 tablet (2 mg total) by mouth daily.   albuterol  (VENTOLIN  HFA) 108 (90 Base) MCG/ACT inhaler Inhale 1-2 puffs into the lungs every 4 (four) hours as needed for wheezing or shortness of breath.   aspirin  325 MG tablet Take 325 mg by mouth in the morning.   cetirizine (ZYRTEC) 10 MG tablet Take 10 mg by mouth at bedtime.   docusate sodium  (  COLACE) 100 MG capsule Take 200 mg by mouth every morning.   esomeprazole  (NEXIUM ) 40 MG capsule Take 1 capsule (40 mg total) by mouth 2 (two) times daily before a meal.   guaiFENesin  (MUCINEX ) 600 MG 12 hr tablet Take 1,200 mg by mouth 2 (two) times daily as needed for cough or to loosen phlegm.   hydrOXYzine  (ATARAX ) 25 MG tablet Take 1 tablet (25 mg total) by mouth 3 (three) times daily as needed for anxiety (TID prn for anxiety and then  can take up to 2 pills/nightly for sleep).   ipratropium (ATROVENT ) 0.03 % nasal spray Place 3 sprays into both nostrils 3 (three) times daily as needed for rhinitis.   LINZESS  290 MCG CAPS capsule TAKE ONE CAPSULE ( TOTAL) BY MOUTHDAILY BEFORE BREAKFAST   lovastatin  (MEVACOR ) 20 MG tablet Take 1 tablet (20 mg total) by mouth at bedtime. (Patient taking differently: Take 20 mg by mouth in the morning.)   meclizine  (ANTIVERT ) 25 MG tablet Take 1 tablet (25 mg total) by mouth 3 (three) times daily as needed for dizziness. (Patient taking differently: Take 25 mg by mouth See admin instructions. Take 1 tablet (25 mg) by mouth scheduled every morning, and may repeat dose (25 mg) by mouth up to twice daily if needed for dizziness/vertigo.)   midodrine  (PROAMATINE ) 10 MG tablet Take 1 tablet (10 mg  total) by mouth 3 (three) times daily.   PARoxetine  (PAXIL ) 20 MG tablet Take 1 tablet (20 mg total) by mouth daily. Can also take at night if need be.   polyethylene glycol (MIRALAX  / GLYCOLAX ) 17 g packet Take 17 g by mouth daily.   potassium chloride  (KLOR-CON ) 10 MEQ tablet Take 10 mEq by mouth every morning.   timolol (TIMOPTIC) 0.5 % ophthalmic solution Place 1 drop into the right eye 2 (two) times daily.   traZODone  (DESYREL ) 150 MG tablet Take 1 tablet (150 mg total) by mouth at bedtime.   [DISCONTINUED] Dulaglutide  (TRULICITY ) 1.5 MG/0.5ML SOAJ Inject 1.5 mg into the skin once a week.   [DISCONTINUED] levothyroxine  (SYNTHROID ) 50 MCG tablet TAKE ONE TABLET BY MOUTH EVERY DAY BEFORE breakfast   Dulaglutide  (TRULICITY ) 1.5 MG/0.5ML SOAJ Inject 1.5 mg into the skin once a week.   levothyroxine  (SYNTHROID ) 50 MCG tablet Take 1 tablet (50 mcg total) by mouth daily before breakfast.   No facility-administered encounter medications on file as of 10/06/2023.    ALLERGIES: Allergies  Allergen Reactions   Metformin  And Related Nausea And Vomiting    VACCINATION STATUS:  There is no immunization history on file for this patient.  Diabetes She presents for her follow-up diabetic visit. She has type 2 diabetes mellitus. Onset time: diagnosed at approx age of 64. Her disease course has been stable. There are no hypoglycemic associated symptoms. Associated symptoms include blurred vision and fatigue. There are no hypoglycemic complications. Symptoms are stable. Diabetic complications include heart disease, nephropathy, PVD and retinopathy. (gastroparesis) Risk factors for coronary artery disease include diabetes mellitus, dyslipidemia and family history. Current diabetic treatments: Trulicity  only. She is compliant with treatment most of the time. Her weight is fluctuating minimally. She is following a generally unhealthy diet. She has not had a previous visit with a dietitian. She rarely  participates in exercise. (She presents today, accompanied by her daughter, with no meter or logs to review.  She does not monitor glucose routinely due to safe regimen. Her most recent A1c on 5/7 was 6.3%, increasing from last A1c of 5.2% but  still at goal.  She denies any hypoglycemia.  ) An ACE inhibitor/angiotensin II receptor blocker is contraindicated. She does not see a podiatrist.Eye exam is current.     Review of systems  Constitutional: + stable body weight, current Body mass index is 17.85 kg/m., no fatigue, no subjective hyperthermia, no subjective hypothermia Eyes: + blurry vision, no xerophthalmia ENT: no sore throat, no nodules palpated in throat, no dysphagia/odynophagia, no hoarseness Cardiovascular: no chest pain, no shortness of breath, no palpitations, no leg swelling Respiratory: no cough, no shortness of breath Gastrointestinal: no nausea/vomiting/diarrhea, + gastroparesis Musculoskeletal: no muscle/joint aches, essentially WC bound due to deconditioning Skin: no rashes, no hyperemia Neurological: no tremors, no numbness, no tingling, no dizziness Psychiatric: no depression, no anxiety  Objective:     BP 122/72 (BP Location: Left Arm, Patient Position: Sitting)   Pulse 74   Ht 5' 4 (1.626 m)   Wt 104 lb (47.2 kg) Comment: patient reports that this was her last weight, she is in wheelchair, and reports that her legs are very weak.  LMP 01/21/2012   BMI 17.85 kg/m   Wt Readings from Last 3 Encounters:  10/06/23 104 lb (47.2 kg)  09/12/23 105 lb (47.6 kg)  04/13/23 104 lb (47.2 kg)     BP Readings from Last 3 Encounters:  10/06/23 122/72  09/12/23 135/76  08/25/23 132/84     Physical Exam- Limited  Constitutional:  Body mass index is 17.85 kg/m. , not in acute distress, normal state of mind Eyes:  EOMI, no exophthalmos Musculoskeletal: no gross deformities, strength intact in all four extremities, no gross restriction of joint movements, essentially WC  bound due to mobility deficits from recent spinal issues (wearing soft collar) Skin:  no rashes, no hyperemia, female patterned hair loss Neurological: no tremor with outstretched hands  Diabetic Foot Exam - Simple   No data filed      CMP ( most recent) CMP     Component Value Date/Time   NA 138 09/27/2023 0820   K 4.7 09/27/2023 0820   CL 98 09/27/2023 0820   CO2 20 09/27/2023 0820   GLUCOSE 252 (H) 09/27/2023 0820   GLUCOSE 215 (H) 09/12/2023 1357   BUN 24 09/27/2023 0820   CREATININE 0.91 09/27/2023 0820   CALCIUM 9.9 09/27/2023 0820   PROT 7.0 09/27/2023 0820   ALBUMIN  4.1 09/27/2023 0820   AST 18 09/27/2023 0820   ALT 13 09/27/2023 0820   ALKPHOS 78 09/27/2023 0820   BILITOT 0.3 09/27/2023 0820   EGFR 73 09/27/2023 0820   GFRNONAA >60 09/12/2023 1357     Diabetic Labs (most recent): Lab Results  Component Value Date   HGBA1C 5.2 01/10/2023   HGBA1C 7.3 08/16/2022   HGBA1C 9.3 (H) 07/14/2022     Lipid Panel ( most recent) Lipid Panel     Component Value Date/Time   CHOL 146 08/07/2021 0613   TRIG 120 08/07/2021 0613   HDL 44 08/07/2021 0613   CHOLHDL 3.3 08/07/2021 0613   VLDL 24 08/07/2021 0613   LDLCALC 78 08/07/2021 0613      Lab Results  Component Value Date   TSH 3.330 09/27/2023   TSH 1.411 06/20/2022   TSH 1.159 08/06/2021   FREET4 1.49 09/27/2023           Assessment & Plan:   1) Type 2 diabetes mellitus without complication, without long-term current use of insulin  (HCC)  She presents today, accompanied by her daughter, with no meter or  logs to review.  She does not monitor glucose routinely due to safe regimen. Her most recent A1c on 5/7 was 6.3%, increasing from last A1c of 5.2% but still at goal.  She denies any hypoglycemia.    - Erminio LITTIE Laster has currently uncontrolled symptomatic type 2 DM since 58 years of age.   -Recent labs reviewed.  - I had a long discussion with her about the progressive nature of diabetes and the  pathology behind its complications. -her diabetes is complicated by CKD, gastroparesis, PVD, CAD and she remains at a high risk for more acute and chronic complications which include CAD, CVA, CKD, retinopathy, and neuropathy. These are all discussed in detail with her.  The following Lifestyle Medicine recommendations according to American College of Lifestyle Medicine [facility]) were discussed and offered to patient and she agrees to start the journey:  A. Whole Foods, Plant-based plate comprising of fruits and vegetables, plant-based proteins, whole-grain carbohydrates was discussed in detail with the patient.   A list for source of those nutrients were also provided to the patient.  Patient will use only water  or unsweetened tea for hydration. B.  The need to stay away from risky substances including alcohol, smoking; obtaining 7 to 9 hours of restorative sleep, at least 150 minutes of moderate intensity exercise weekly, the importance of healthy social connections,  and stress reduction techniques were discussed. C.  A full color page of  Calorie density of various food groups per pound showing examples of each food groups was provided to the patient.  - Nutritional counseling repeated at each appointment due to patients tendency to fall back in to old habits.  - The patient admits there is a room for improvement in their diet and drink choices. -  Suggestion is made for the patient to avoid simple carbohydrates from their diet including Cakes, Sweet Desserts / Pastries, Ice Cream, Soda (diet and regular), Sweet Tea, Candies, Chips, Cookies, Sweet Pastries, Store Bought Juices, Alcohol in Excess of 1-2 drinks a day, Artificial Sweeteners, Coffee Creamer, and Sugar-free Products. This will help patient to have stable blood glucose profile and potentially avoid unintended weight gain.   - I encouraged the patient to switch to unprocessed or minimally processed complex starch and increased protein  intake (animal or plant source), fruits, and vegetables.   - Patient is advised to stick to a routine mealtimes to eat 3 meals a day and avoid unnecessary snacks (to snack only to correct hypoglycemia).  - I have approached her with the following individualized plan to manage her diabetes and patient agrees:   - she is advised to continue Trulicity  1.5 mg SQ weekly given her stable glycemic profile.  -she is encouraged to start monitoring glucose 1 time a day, before breakfast, and to call the clinic if she has readings less than 70 or above 300 for 3 tests in a row.  - Adjustment parameters are given to her for hypo and hyperglycemia in writing.  - her Glimepiride  was previously discontinued, risk outweighs benefit for this patient (risk of hypoglycemia).  - she is not a candidate for Metformin  due to concurrent renal insufficiency.  - Specific targets for  A1c; LDL, HDL, and Triglycerides were discussed with the patient.  2) Blood Pressure /Hypertension:  her blood pressure is controlled to target, in fact she is on Midodrine  for orthostatic hypotension.   3) Lipids/Hyperlipidemia:    There is no recent lipid panel available to review.  she is advised to  continue Lovastatin  20 mg daily at bedtime.  Side effects and precautions discussed with her.  4)  Weight/Diet:  her Body mass index is 17.85 kg/m.  -   she is NOT a candidate for weight loss.  Exercise, and detailed carbohydrates information provided  -  detailed on discharge instructions.  5) Chronic Care/Health Maintenance: -she is not on ACEI/ARB and is on Statin medications and is encouraged to initiate and continue to follow up with Ophthalmology, Dentist, Podiatrist at least yearly or according to recommendations, and advised to stay away from smoking. I have recommended yearly flu vaccine and pneumonia vaccine at least every 5 years; moderate intensity exercise for up to 150 minutes weekly; and sleep for at least 7 hours a  day.  6) Hypothyroidism-acquired She was diagnosed with hypothyroidism at approximate age of 73.  She is currently on Levothyroxine  50 mcg po daily before breakfast.  She notes she has been taking it with her heart pill in the morning.  She does not take any Biotin supplement.  She does not have family history of thyroid  dysfunction, including thyroid  cancer.  She has never had any imaging of her thyroid  in the past (that she can recall), nor has she been exposed to radiation of her head or neck in the past.  Her previsit TFTs are consistent with appropriate hormone replacement.  She is advised to continue Levothyroxine  50 mcg po daily before breakfast.     - The correct intake of thyroid  hormone (Levothyroxine , Synthroid ), is on empty stomach first thing in the morning, with water , separated by at least 30 minutes from breakfast and other medications,  and separated by more than 4 hours from calcium, iron, multivitamins, acid reflux medications (PPIs).  - This medication is a life-long medication and will be needed to correct thyroid  hormone imbalances for the rest of your life.  The dose may change from time to time, based on thyroid  blood work.  - It is extremely important to be consistent taking this medication, near the same time each morning.  -AVOID TAKING PRODUCTS CONTAINING BIOTIN (commonly found in Hair, Skin, Nails vitamins) AS IT INTERFERES WITH THE VALIDITY OF THYROID  FUNCTION BLOOD TESTS.  She will not need any imaging of her thyroid  at this time due to normal physical exam.  - she is advised to maintain close follow up with Marvine Rush, MD for primary care needs, as well as her other providers for optimal and coordinated care.     I spent  21  minutes in the care of the patient today including review of labs from CMP, Lipids, Thyroid  Function, Hematology (current and previous including abstractions from other facilities); face-to-face time discussing  her blood glucose  readings/logs, discussing hypoglycemia and hyperglycemia episodes and symptoms, medications doses, her options of short and long term treatment based on the latest standards of care / guidelines;  discussion about incorporating lifestyle medicine;  and documenting the encounter. Risk reduction counseling performed per USPSTF guidelines to reduce obesity and cardiovascular risk factors.     Please refer to Patient Instructions for Blood Glucose Monitoring and Insulin /Medications Dosing Guide  in media tab for additional information. Please  also refer to  Patient Self Inventory in the Media  tab for reviewed elements of pertinent patient history.  Erminio LITTIE Laster participated in the discussions, expressed understanding, and voiced agreement with the above plans.  All questions were answered to her satisfaction. she is encouraged to contact clinic should she have any questions or concerns  prior to her return visit.     Follow up plan: - Return in about 6 months (around 04/07/2024) for Diabetes F/U with A1c in office, Thyroid  follow up, Previsit labs.    Benton Rio, [facility] [address] [place], [place] [address] Phone: [phone] Fax: [phone]  10/06/2023, 9:31 AM

## 2023-10-10 ENCOUNTER — Ambulatory Visit: Admitting: Gastroenterology

## 2023-10-20 ENCOUNTER — Other Ambulatory Visit: Payer: Self-pay

## 2023-10-20 ENCOUNTER — Encounter (HOSPITAL_COMMUNITY): Payer: Self-pay | Admitting: Ophthalmology

## 2023-10-20 NOTE — Progress Notes (Signed)
 PCP - Dr. Norleen General Cardiologist - denies  PPM/ICD - denies   Chest x-ray - 07/25/22 EKG - 09/12/23 Stress Test - denies ECHO - 08/06/21 Cardiac Cath - denies  CPAP - denies  Fasting Blood Sugar - 90-100 Checks Blood Sugar every other day   Trulicity - pt took last dose on 8/5 (she says she was not told to hold)  Blood Thinner Instructions: n/a Aspirin  Instructions: f/u with surgeon (pt states she took last dose 8/7)  ERAS Protcol - clears until 1320  COVID TEST- n/a  Anesthesia review: no  Patient verbally denies any shortness of breath, fever, cough and chest pain during phone call     Questions were answered. Patient verbalized understanding of instructions.

## 2023-10-20 NOTE — Pre-Procedure Instructions (Signed)
 -------------  SDW INSTRUCTIONS given:  Your procedure is scheduled on 8/12.  Report to [facility] Main Entrance A at 1:50 P.M., and check in at the Admitting office.  Any questions or running late day of surgery: call [phone]    Remember:  Do not eat after midnight the night before your surgery  You may drink clear liquids until 1:20 PM the day of your surgery.   Clear liquids allowed are: Water , Non-Citrus Juices (without pulp), Carbonated Beverages, Clear Tea, Black Coffee Only, and Gatorade    Take these medicines the morning of surgery with A SIP OF WATER   albuterol , esomeprazole , levothyroxine , lovastatin , midodrine , timolol drops                  May take these medicines IF NEEDED:  albuterol  inhaler, hydroxyzine , atrovent , meclizine     Follow your surgeon's instructions on when to stop Aspirin .  If no instructions were given by your surgeon then you will need to call the office to get those instructions.    As of today, STOP taking any Aleve, Naproxen, Ibuprofen , Motrin , Advil , Goody's, BC's, all herbal medications, fish oil, and all vitamins.  WHAT DO I DO ABOUT MY DIABETES MEDICATION?   STOP taking Trulicity  7 days prior to surgery    HOW TO MANAGE YOUR DIABETES BEFORE AND AFTER SURGERY  Why is it important to control my blood sugar before and after surgery? Improving blood sugar levels before and after surgery helps healing and can limit problems. A way of improving blood sugar control is eating a healthy diet by:  Eating less sugar and carbohydrates  Increasing activity/exercise  Talking with your doctor about reaching your blood sugar goals High blood sugars (greater than 180 mg/dL) can raise your risk of infections and slow your recovery, so you will need to focus on controlling your diabetes during the weeks before surgery. Make sure that the doctor who takes care of your diabetes knows about your planned surgery including the date and location.  How  do I manage my blood sugar before surgery? Check your blood sugar at least 4 times a day, starting 2 days before surgery, to make sure that the level is not too high or low.  Check your blood sugar the morning of your surgery when you wake up and every 2 hours until you get to the Short Stay unit.  If your blood sugar is less than 70 mg/dL, you will need to treat for low blood sugar: Do not take insulin . Treat a low blood sugar (less than 70 mg/dL) with  cup of clear juice (cranberry or apple), 4 glucose tablets, OR glucose gel. Recheck blood sugar in 15 minutes after treatment (to make sure it is greater than 70 mg/dL). If your blood sugar is not greater than 70 mg/dL on recheck, call [phone] for further instructions. Report your blood sugar to the short stay nurse when you get to Short Stay.  If you are admitted to the hospital after surgery: Your blood sugar will be checked by the staff and you will probably be given insulin  after surgery (instead of oral diabetes medicines) to make sure you have good blood sugar levels. The goal for blood sugar control after surgery is 80-180 mg/dL.   Do NOT Smoke (Tobacco/Vaping) 24 hours prior to your procedure  If you use a CPAP at night, you may bring all equipment for your overnight stay.     You will be asked to remove any contacts, glasses, piercing's,  hearing aid's, dentures/partials prior to surgery. Please bring cases for these items if needed.     Patients discharged the day of surgery will not be allowed to drive home, and someone needs to stay with them for 24 hours.  SURGICAL WAITING ROOM VISITATION Patients may have no more than 2 support people in the waiting area - these visitors may rotate.   Pre-op nurse will coordinate an appropriate time for 1 ADULT support person, who may not rotate, to accompany patient in pre-op.  Children under the age of 33 must have an adult with them who is not the patient and must remain in the main  waiting area with an adult.  If the patient needs to stay at the hospital during part of their recovery, the visitor guidelines for inpatient rooms apply.  Please refer to the [facility] website for the visitor guidelines for any additional information.   Special instructions:   [place]- Preparing For Surgery   Please follow these instructions carefully.   Shower the NIGHT BEFORE SURGERY and the MORNING OF SURGERY with DIAL Soap.   Pat yourself dry with a CLEAN TOWEL.  Wear CLEAN PAJAMAS to bed the night before surgery  Place CLEAN SHEETS on your bed the night of your first shower and DO NOT SLEEP WITH PETS.   Additional instructions for the day of surgery: DO NOT APPLY any lotions, deodorants, cologne, or perfumes.   Do not wear jewelry or makeup Do not wear nail polish, gel polish, artificial nails, or any other type of covering on natural nails (fingers and toes) Do not bring valuables to the hospital. [facility] is not responsible for valuables/personal belongings. Put on clean/comfortable clothes.  Please brush your teeth.  Ask your nurse before applying any prescription medications to the skin.

## 2023-10-24 ENCOUNTER — Ambulatory Visit (HOSPITAL_COMMUNITY)

## 2023-10-24 ENCOUNTER — Other Ambulatory Visit: Payer: Self-pay

## 2023-10-24 ENCOUNTER — Encounter (HOSPITAL_COMMUNITY)
Admission: RE | Disposition: A | Discharge status: Home / Self Care | Payer: Self-pay | Source: Home / Self Care | Attending: Ophthalmology

## 2023-10-24 ENCOUNTER — Encounter (HOSPITAL_COMMUNITY): Payer: Self-pay | Admitting: Ophthalmology

## 2023-10-24 ENCOUNTER — Ambulatory Visit (HOSPITAL_COMMUNITY)
Admission: RE | Admit: 2023-10-24 | Discharge: 2023-10-24 | Disposition: A | Discharge status: Home / Self Care | Attending: Ophthalmology | Admitting: Ophthalmology

## 2023-10-24 DIAGNOSIS — I10 Essential (primary) hypertension: Secondary | ICD-10-CM

## 2023-10-24 DIAGNOSIS — H43391 Other vitreous opacities, right eye: Secondary | ICD-10-CM

## 2023-10-24 DIAGNOSIS — E1143 Type 2 diabetes mellitus with diabetic autonomic (poly)neuropathy: Secondary | ICD-10-CM | POA: Diagnosis not present

## 2023-10-24 DIAGNOSIS — K219 Gastro-esophageal reflux disease without esophagitis: Secondary | ICD-10-CM | POA: Insufficient documentation

## 2023-10-24 DIAGNOSIS — J449 Chronic obstructive pulmonary disease, unspecified: Secondary | ICD-10-CM | POA: Diagnosis not present

## 2023-10-24 DIAGNOSIS — J4489 Other specified chronic obstructive pulmonary disease: Secondary | ICD-10-CM | POA: Diagnosis not present

## 2023-10-24 DIAGNOSIS — E1151 Type 2 diabetes mellitus with diabetic peripheral angiopathy without gangrene: Secondary | ICD-10-CM | POA: Insufficient documentation

## 2023-10-24 DIAGNOSIS — Z419 Encounter for procedure for purposes other than remedying health state, unspecified: Secondary | ICD-10-CM | POA: Diagnosis not present

## 2023-10-24 DIAGNOSIS — Z87891 Personal history of nicotine dependence: Secondary | ICD-10-CM | POA: Diagnosis not present

## 2023-10-24 DIAGNOSIS — E1136 Type 2 diabetes mellitus with diabetic cataract: Secondary | ICD-10-CM | POA: Diagnosis not present

## 2023-10-24 DIAGNOSIS — Z794 Long term (current) use of insulin: Secondary | ICD-10-CM | POA: Insufficient documentation

## 2023-10-24 DIAGNOSIS — H2521 Age-related cataract, morgagnian type, right eye: Secondary | ICD-10-CM | POA: Diagnosis present

## 2023-10-24 DIAGNOSIS — K3184 Gastroparesis: Secondary | ICD-10-CM | POA: Diagnosis not present

## 2023-10-24 HISTORY — PX: VITRECTOMY AND CATARACT: SHX6184

## 2023-10-24 LAB — CBC
HCT: 42.2 % (ref 36.0–46.0)
Hemoglobin: 14.5 g/dL (ref 12.0–15.0)
MCH: 31.9 pg (ref 26.0–34.0)
MCHC: 34.4 g/dL (ref 30.0–36.0)
MCV: 92.7 fL (ref 80.0–100.0)
Platelets: 206 K/uL (ref 150–400)
RBC: 4.55 MIL/uL (ref 3.87–5.11)
RDW: 11.3 % — ABNORMAL LOW (ref 11.5–15.5)
WBC: 7.9 K/uL (ref 4.0–10.5)
nRBC: 0 % (ref 0.0–0.2)

## 2023-10-24 LAB — GLUCOSE, CAPILLARY
Glucose-Capillary: 178 mg/dL — ABNORMAL HIGH (ref 70–99)
Glucose-Capillary: 123 mg/dL — ABNORMAL HIGH (ref 70–99)

## 2023-10-24 SURGERY — EXTRACTION, CATARACT, WITH VITRECTOMY
Anesthesia: Monitor Anesthesia Care | Site: Eye | Laterality: Right

## 2023-10-24 MED ORDER — ORAL CARE MOUTH RINSE: 15.0000 mL | Freq: Once | OROMUCOSAL | Status: AC

## 2023-10-24 MED ORDER — BUPIVACAINE HCL (PF) 0.75 % IJ SOLN
INTRAMUSCULAR | Status: AC
  Filled 2023-10-24: qty 10

## 2023-10-24 MED ORDER — ACETAMINOPHEN 500 MG PO TABS
1000.0000 mg | ORAL_TABLET | Freq: Once | ORAL | Status: AC
  Administered 2023-10-24 (×2): 1000 mg via ORAL
  Filled 2023-10-24: qty 2

## 2023-10-24 MED ORDER — PHENYLEPHRINE HCL 10 % OP SOLN
1.0000 [drp] | OPHTHALMIC | Status: AC | PRN
  Administered 2023-10-24 (×4): 1 [drp] via OPHTHALMIC

## 2023-10-24 MED ORDER — BSS PLUS IO SOLN
INTRAOCULAR | Status: AC
  Filled 2023-10-24: qty 500

## 2023-10-24 MED ORDER — LIDOCAINE HCL 2 % IJ SOLN
INTRAMUSCULAR | Status: DC | PRN
  Administered 2023-10-24 (×2): 9 mL via RETROBULBAR

## 2023-10-24 MED ORDER — NA CHONDROIT SULF-NA HYALURON 40-30 MG/ML IO SOSY
INTRAOCULAR | Status: DC | PRN
  Administered 2023-10-24 (×2): .5 mL via INTRAOCULAR

## 2023-10-24 MED ORDER — ATROPINE SULFATE 1 % OP SOLN
OPHTHALMIC | Status: AC
  Filled 2023-10-24: qty 5

## 2023-10-24 MED ORDER — LACTATED RINGERS IV SOLN: INTRAVENOUS | Status: DC

## 2023-10-24 MED ORDER — TETRACAINE HCL 0.5 % OP SOLN
OPHTHALMIC | Status: AC
Start: 2023-10-24 — End: 2023-10-24
  Filled 2023-10-24: qty 4

## 2023-10-24 MED ORDER — TOBRAMYCIN-DEXAMETHASONE 0.3-0.1 % OP OINT
TOPICAL_OINTMENT | OPHTHALMIC | Status: DC | PRN
  Administered 2023-10-24 (×2): 1 via OPHTHALMIC

## 2023-10-24 MED ORDER — PROPARACAINE HCL 0.5 % OP SOLN
1.0000 [drp] | OPHTHALMIC | Status: AC | PRN
  Administered 2023-10-24 (×4): 1 [drp] via OPHTHALMIC

## 2023-10-24 MED ORDER — PROPARACAINE HCL 0.5 % OP SOLN
OPHTHALMIC | Status: AC
  Administered 2023-10-24 (×2): 1 [drp] via OPHTHALMIC
  Filled 2023-10-24: qty 15

## 2023-10-24 MED ORDER — TROPICAMIDE 1 % OP SOLN
OPHTHALMIC | Status: AC
  Administered 2023-10-24 (×2): 1 [drp] via OPHTHALMIC
  Filled 2023-10-24: qty 15

## 2023-10-24 MED ORDER — BSS IO SOLN
INTRAOCULAR | Status: DC | PRN
  Administered 2023-10-24 (×2): 30 mL
  Administered 2023-10-24 (×2): 15 mL

## 2023-10-24 MED ORDER — FENTANYL CITRATE (PF) 100 MCG/2ML IJ SOLN: 25.0000 ug | INTRAMUSCULAR | Status: DC | PRN

## 2023-10-24 MED ORDER — SODIUM HYALURONATE 10 MG/ML IO SOLUTION
PREFILLED_SYRINGE | INTRAOCULAR | Status: AC
  Filled 2023-10-24: qty 0.85

## 2023-10-24 MED ORDER — ONDANSETRON HCL 4 MG/2ML IJ SOLN
INTRAMUSCULAR | Status: AC
Start: 2023-10-24 — End: 2023-10-24
  Filled 2023-10-24: qty 2

## 2023-10-24 MED ORDER — NA CHONDROIT SULF-NA HYALURON 40-30 MG/ML IO SOSY
INTRAOCULAR | Status: AC
Start: 2023-10-24 — End: 2023-10-24
  Filled 2023-10-24: qty 0.5

## 2023-10-24 MED ORDER — TOBRAMYCIN-DEXAMETHASONE 0.3-0.1 % OP OINT
TOPICAL_OINTMENT | OPHTHALMIC | Status: AC
  Filled 2023-10-24: qty 3.5

## 2023-10-24 MED ORDER — PROPOFOL 10 MG/ML IV BOLUS
INTRAVENOUS | Status: AC
  Filled 2023-10-24: qty 20

## 2023-10-24 MED ORDER — PHENYLEPHRINE HCL 10 % OP SOLN
OPHTHALMIC | Status: AC
Start: 2023-10-24 — End: 2023-10-24
  Administered 2023-10-24 (×2): 1 [drp] via OPHTHALMIC
  Filled 2023-10-24: qty 5

## 2023-10-24 MED ORDER — DROPERIDOL 2.5 MG/ML IJ SOLN: 0.6250 mg | Freq: Once | INTRAMUSCULAR | Status: DC | PRN

## 2023-10-24 MED ORDER — NA CHONDROIT SULF-NA HYALURON 40-30 MG/ML IO SOSY
INTRAOCULAR | Status: AC
  Filled 2023-10-24: qty 0.5

## 2023-10-24 MED ORDER — SODIUM CHLORIDE 0.9 % IV SOLN: INTRAVENOUS | Status: DC

## 2023-10-24 MED ORDER — DEXAMETHASONE SODIUM PHOSPHATE 10 MG/ML IJ SOLN
INTRAMUSCULAR | Status: DC | PRN
  Administered 2023-10-24 (×2): 10 mg

## 2023-10-24 MED ORDER — CEFAZOLIN SUBCONJUNCTIVAL INJECTION 100 MG/0.5 ML
100.0000 mg | INJECTION | SUBCONJUNCTIVAL | Status: DC
  Filled 2023-10-24: qty 5

## 2023-10-24 MED ORDER — TROPICAMIDE 1 % OP SOLN
1.0000 [drp] | OPHTHALMIC | Status: AC | PRN
  Administered 2023-10-24 (×4): 1 [drp] via OPHTHALMIC

## 2023-10-24 MED ORDER — EPINEPHRINE PF 1 MG/ML IJ SOLN
INTRAMUSCULAR | Status: AC
  Filled 2023-10-24: qty 1

## 2023-10-24 MED ORDER — PROPOFOL 10 MG/ML IV BOLUS
INTRAVENOUS | Status: DC | PRN
  Administered 2023-10-24 (×2): 40 mg via INTRAVENOUS

## 2023-10-24 MED ORDER — EPINEPHRINE PF 1 MG/ML IJ SOLN
INTRAOCULAR | Status: DC | PRN
  Administered 2023-10-24 (×4): 500.3 mL

## 2023-10-24 MED ORDER — DEXAMETHASONE SODIUM PHOSPHATE 10 MG/ML IJ SOLN
INTRAMUSCULAR | Status: AC
  Filled 2023-10-24: qty 1

## 2023-10-24 MED ORDER — OFLOXACIN 0.3 % OP SOLN
OPHTHALMIC | Status: AC
Start: 2023-10-24 — End: 2023-10-24
  Administered 2023-10-24 (×2): 1 [drp] via OPHTHALMIC
  Filled 2023-10-24: qty 5

## 2023-10-24 MED ORDER — TRYPAN BLUE 0.06 % IO SOSY
0.5000 mL | PREFILLED_SYRINGE | INTRAOCULAR | Status: AC
  Administered 2023-10-24 (×2): .5 mL via INTRAOCULAR
  Filled 2023-10-24: qty 0.5

## 2023-10-24 MED ORDER — INSULIN ASPART 100 UNIT/ML IJ SOLN: 0.0000 [IU] | INTRAMUSCULAR | Status: DC | PRN

## 2023-10-24 MED ORDER — CHLORHEXIDINE GLUCONATE 0.12 % MT SOLN
15.0000 mL | Freq: Once | OROMUCOSAL | Status: AC
  Administered 2023-10-24 (×2): 15 mL via OROMUCOSAL
  Filled 2023-10-24: qty 15

## 2023-10-24 MED ORDER — FENTANYL CITRATE (PF) 250 MCG/5ML IJ SOLN
INTRAMUSCULAR | Status: AC
  Filled 2023-10-24: qty 5

## 2023-10-24 MED ORDER — OFLOXACIN 0.3 % OP SOLN
1.0000 [drp] | OPHTHALMIC | Status: AC | PRN
  Administered 2023-10-24 (×4): 1 [drp] via OPHTHALMIC

## 2023-10-24 MED ORDER — MIDODRINE HCL 5 MG PO TABS
10.0000 mg | ORAL_TABLET | Freq: Once | ORAL | Status: AC
  Administered 2023-10-24 (×2): 10 mg via ORAL
  Filled 2023-10-24: qty 2

## 2023-10-24 MED ORDER — LIDOCAINE HCL 2 % IJ SOLN
INTRAMUSCULAR | Status: AC
  Filled 2023-10-24: qty 20

## 2023-10-24 MED ORDER — CEFAZOLIN SUBCONJUNCTIVAL INJECTION 100 MG/0.5 ML
INJECTION | SUBCONJUNCTIVAL | Status: DC | PRN
Start: 2023-10-24 — End: 2023-10-24
  Administered 2023-10-24 (×2): 200 mg via SUBCONJUNCTIVAL

## 2023-10-24 MED ORDER — SODIUM HYALURONATE 10 MG/ML IO SOLUTION
PREFILLED_SYRINGE | INTRAOCULAR | Status: DC | PRN
Start: 2023-10-24 — End: 2023-10-24
  Administered 2023-10-24 (×4): .85 mL via INTRAOCULAR

## 2023-10-24 MED ORDER — MIDAZOLAM HCL 2 MG/2ML IJ SOLN
INTRAMUSCULAR | Status: AC
  Filled 2023-10-24: qty 2

## 2023-10-24 MED ORDER — MIDAZOLAM HCL 2 MG/2ML IJ SOLN
INTRAMUSCULAR | Status: DC | PRN
  Administered 2023-10-24: 1 mg via INTRAVENOUS
  Administered 2023-10-24 (×3): .5 mg via INTRAVENOUS
  Administered 2023-10-24: 1 mg via INTRAVENOUS
  Administered 2023-10-24: .5 mg via INTRAVENOUS

## 2023-10-24 MED ORDER — HYALURONIDASE HUMAN 150 UNIT/ML IJ SOLN
INTRAMUSCULAR | Status: AC
Start: 2023-10-24 — End: 2023-10-24
  Filled 2023-10-24: qty 1

## 2023-10-24 MED ORDER — BSS IO SOLN
INTRAOCULAR | Status: AC
  Filled 2023-10-24: qty 15

## 2023-10-24 MED ORDER — ONDANSETRON HCL 4 MG/2ML IJ SOLN
INTRAMUSCULAR | Status: DC | PRN
  Administered 2023-10-24 (×2): 4 mg via INTRAVENOUS

## 2023-10-24 SURGICAL SUPPLY — 43 items
APPLICATOR COTTON TIP 6 STRL (MISCELLANEOUS) ×2 IMPLANT
BAG COUNTER SPONGE SURGICOUNT (BAG) ×2 IMPLANT
BLADE KERATOME 2.75 (BLADE) ×2 IMPLANT
BLADE STAB KNIFE 15DEG (BLADE) IMPLANT
BNDG EYE OVAL 2 1/8 X 2 5/8 (GAUZE/BANDAGES/DRESSINGS) IMPLANT
CABLE BIPOLOR RESECTION CORD (MISCELLANEOUS) IMPLANT
CANNULA VLV SOFT TIP 25G (OPHTHALMIC) ×2 IMPLANT
CLSR STERI-STRIP ANTIMIC 1/2X4 (GAUZE/BANDAGES/DRESSINGS) ×2 IMPLANT
DRAPE HALF SHEET 40X57 (DRAPES) ×2 IMPLANT
DRAPE RETRACTOR (MISCELLANEOUS) ×2 IMPLANT
FORCEPS GRIESHABER ILM 25G A (INSTRUMENTS) IMPLANT
GLOVE SURG SYN 7.5 PF PI (GLOVE) ×2 IMPLANT
GOWN STRL REUS W/ TWL LRG LVL3 (GOWN DISPOSABLE) ×2 IMPLANT
KIT BASIN OR (CUSTOM PROCEDURE TRAY) ×2 IMPLANT
KIT TURNOVER KIT B (KITS) ×2 IMPLANT
LENS BIOM SUPER VIEW SET DISP (MISCELLANEOUS) ×2 IMPLANT
NDL 18GX1X1/2 (RX/OR ONLY) (NEEDLE) ×2 IMPLANT
NDL 25GX 5/8IN NON SAFETY (NEEDLE) ×2 IMPLANT
NDL 27GX1/2 REG BEVEL ECLIP (NEEDLE) ×2 IMPLANT
NDL FILTER BLUNT 18X1 1/2 (NEEDLE) ×2 IMPLANT
NDL HYPO 30X.5 LL (NEEDLE) ×4 IMPLANT
NDL RETROBULBAR 25GX1.5 (NEEDLE) ×2 IMPLANT
NEEDLE 18GX1X1/2 (RX/OR ONLY) (NEEDLE) ×1 IMPLANT
NEEDLE 25GX 5/8IN NON SAFETY (NEEDLE) ×1 IMPLANT
NEEDLE 27GX1/2 REG BEVEL ECLIP (NEEDLE) ×1 IMPLANT
NEEDLE FILTER BLUNT 18X1 1/2 (NEEDLE) ×1 IMPLANT
NEEDLE HYPO 30X.5 LL (NEEDLE) ×2 IMPLANT
NEEDLE RETROBULBAR 25GX1.5 (NEEDLE) ×1 IMPLANT
PACK CATARACT/VITRECTOMY 25GA (OPHTHALMIC) ×2 IMPLANT
PACK VITRECTOMY CUSTOM (CUSTOM PROCEDURE TRAY) ×2 IMPLANT
PAD ARMBOARD POSITIONER FOAM (MISCELLANEOUS) ×4 IMPLANT
PAK PIK VITRECTOMY CVS 25GA (OPHTHALMIC) IMPLANT
PROBE ENDO DIATHERMY 25G (MISCELLANEOUS) IMPLANT
SHIELD EYE LENSE ONLY DISP (GAUZE/BANDAGES/DRESSINGS) IMPLANT
SOL ANTI FOG 6CC (MISCELLANEOUS) ×2 IMPLANT
SUT ETHILON 10 0 BV75 4 (SUTURE) IMPLANT
SUT ETHILON 10 0 CS140 6 (SUTURE) IMPLANT
SYR 10ML LL (SYRINGE) IMPLANT
SYR 20ML LL LF (SYRINGE) ×2 IMPLANT
SYR 5ML LL (SYRINGE) ×2 IMPLANT
SYR TB 1ML LUER SLIP (SYRINGE) IMPLANT
TIP ABS 45DEG FLARED 0.9MM (TIP) ×2 IMPLANT
WATER STERILE IRR 1000ML POUR (IV SOLUTION) ×2 IMPLANT

## 2023-10-24 NOTE — Discharge Instructions (Addendum)
 SLEEP WITH HEAD ELEVATED, DO NOT SLEEP ON BACK  DURING DAY KEEP UPRIGHT

## 2023-10-24 NOTE — Brief Op Note (Signed)
 10/24/2023  6:12 PM  PATIENT:  Erminio LITTIE Laster  58 y.o. female  PRE-OPERATIVE DIAGNOSIS:  Morgagnian CATARACT RIGHT EYE, Vitreous opacities right eye.  POST-OPERATIVE DIAGNOSIS:   Morgagnian CATARACT RIGHT EYE, Vitreous opacities right eye.  PROCEDURE:  Procedure(s) with comments: Complex cataract EXTRACTION, Implant of intraocular lens, explant of intraocular lens, Vitrectomy, (Right)   SURGEON:  Surgeons and Role:    * Tobie Baptist, MD - Primary  PHYSICIAN ASSISTANT:   ASSISTANTS: none   ANESTHESIA:   local and MAC  EBL:  0 mL   BLOOD ADMINISTERED:none  DRAINS: none   LOCAL MEDICATIONS USED:  MARCAINE     and LIDOCAINE    SPECIMEN:  No Specimen  DISPOSITION OF SPECIMEN:  N/A  COUNTS:  YES  TOURNIQUET:  * No tourniquets in log *  DICTATION: .Note written in EPIC  PLAN OF CARE: Discharge to home after PACU  PATIENT DISPOSITION:  PACU - hemodynamically stable.   Delay start of Pharmacological VTE agent (>24hrs) due to surgical blood loss or risk of bleeding: not applicable

## 2023-10-24 NOTE — Transfer of Care (Signed)
 Immediate Anesthesia Transfer of Care Note  Patient: Alexandria Webster  Procedure(s) Performed: EXTRACTION, CATARACT, WITH VITRECTOMY (Right: Eye)  Patient Location: PACU  Anesthesia Type:MAC  Level of Consciousness: awake, alert , and oriented  Airway & Oxygen  Therapy: Patient Spontanous Breathing and Patient connected to nasal cannula oxygen   Post-op Assessment: Report given to RN and Post -op Vital signs reviewed and stable  Post vital signs: Reviewed and stable  Last Vitals:  Vitals Value Taken Time  BP 113/71 10/24/23 18:17  Temp    Pulse 67 10/24/23 18:18  Resp 13 10/24/23 18:18  SpO2 97 % 10/24/23 18:18  Vitals shown include unfiled device data.  Last Pain:  Vitals:   10/24/23 1408  TempSrc: Oral  PainSc: 0-No pain      Patients Stated Pain Goal: 3 (10/24/23 1408)  Complications: No notable events documented.

## 2023-10-24 NOTE — Anesthesia Postprocedure Evaluation (Signed)
 Anesthesia Post Note  Patient: Alexandria Webster  Procedure(s) Performed: EXTRACTION, CATARACT, WITH VITRECTOMY (Right: Eye)     Patient location during evaluation: PACU Anesthesia Type: MAC Level of consciousness: awake and alert Pain management: pain level controlled Vital Signs Assessment: post-procedure vital signs reviewed and stable Respiratory status: spontaneous breathing, nonlabored ventilation, respiratory function stable and patient connected to nasal cannula oxygen  Cardiovascular status: blood pressure returned to baseline and stable Postop Assessment: no apparent nausea or vomiting Anesthetic complications: no   No notable events documented.  Last Vitals:  Vitals:   10/24/23 1830 10/24/23 1845  BP: (!) 113/51 105/63  Pulse: 68 69  Resp: 12 15  Temp:  36.7 C  SpO2: 96% 98%    Last Pain:  Vitals:   10/24/23 1830  TempSrc:   PainSc: 0-No pain                 [name]

## 2023-10-24 NOTE — Anesthesia Preprocedure Evaluation (Addendum)
 Anesthesia Evaluation  Patient identified by MRN, date of birth, ID band Patient awake    Reviewed: Allergy & Precautions, H&P , NPO status , Patient's Chart, lab work & pertinent test results  History of Anesthesia Complications (+) PONV and history of anesthetic complications  Airway Mallampati: II  TM Distance: >3 FB Neck ROM: Limited    Dental  (+) Edentulous Lower, Poor Dentition, Chipped, Missing, Dental Advisory Given,    Pulmonary asthma , COPD, former smoker   Pulmonary exam normal breath sounds clear to auscultation       Cardiovascular hypertension, (-) angina + Peripheral Vascular Disease  (-) Past MI  Rhythm:Regular Rate:Normal  Echo 2023  1. Left ventricular ejection fraction, by estimation, is 65 to 70%. Left ventricular ejection fraction by 2D MOD biplane is 68.5 %. The left ventricle has normal function. The left ventricle has no regional wall motion abnormalities. Left ventricular diastolic parameters are consistent with Grade I diastolic dysfunction (impaired relaxation).   2. Right ventricular systolic function is normal. The right ventricular size is normal. Tricuspid regurgitation signal is inadequate for assessing PA pressure.   3. The mitral valve is grossly normal. No evidence of mitral valve regurgitation.   4. The aortic valve is tricuspid. Aortic valve regurgitation is not visualized.   5. The inferior vena cava is normal in size with greater than 50% respiratory variability, suggesting right atrial pressure of 3 mmHg.     Neuro/Psych neg Seizures PSYCHIATRIC DISORDERS  Depression       GI/Hepatic Neg liver ROS,GERD  Medicated,,gastroparesis   Endo/Other  diabetes, Insulin  DependentHypothyroidism    Renal/GU Renal disease     Musculoskeletal  (+) Arthritis ,    Abdominal   Peds  Hematology negative hematology ROS (+)   Anesthesia Other Findings   Reproductive/Obstetrics                               Anesthesia Physical Anesthesia Plan  ASA: 3  Anesthesia Plan: MAC   Post-op Pain Management: Tylenol  PO (pre-op)*   Induction: Intravenous  PONV Risk Score and Plan: 3 and Treatment may vary due to age or medical condition, Ondansetron , Propofol  infusion and TIVA  Airway Management Planned: Natural Airway  Additional Equipment: None  Intra-op Plan:   Post-operative Plan:   Informed Consent: I have reviewed the patients History and Physical, chart, labs and discussed the procedure including the risks, benefits and alternatives for the proposed anesthesia with the patient or authorized representative who has indicated his/her understanding and acceptance.     Dental advisory given  Plan Discussed with: CRNA  Anesthesia Plan Comments: (See PAT note from 6/27)        Anesthesia Quick Evaluation

## 2023-10-24 NOTE — H&P (Signed)
 Date of examination:  10/24/2023  Indication for surgery: Cataract right eye  Pertinent past medical history:  Past Medical History:  Diagnosis Date   Asthma    COPD (chronic obstructive pulmonary disease) (HCC)    DDD (degenerative disc disease), lumbar    Depression    Diabetes mellitus    Gastroparesis    GERD without esophagitis    High cholesterol    Hypertension    Hypothyroidism    PONV (postoperative nausea and vomiting)    PVD (peripheral vascular disease) (HCC)    Wears glasses     Pertinent ocular history:  decreased vision  Pertinent family history:  Family History  Problem Relation Age of Onset   Stroke Mother    Hypertension Mother    Heart failure Father    Asthma Father    Diabetes Father    Heart disease Father    Emphysema Paternal Grandfather    Colon cancer Neg Hx    Colon polyps Neg Hx     General:  Healthy appearing patient in no distress.     Eyes:    Acuity OD 20/800   External: Within normal limits      Anterior segment: Brunescent cataract right eye        Fundus: limited view      Impression: Cataract right eye  Plan:  Cataract extraction with intraocular lens implant right eye  Onesimo Blanch, MD

## 2023-10-24 NOTE — Anesthesia Procedure Notes (Signed)
 Procedure Name: MAC Date/Time: 10/24/2023 4:22 PM  Performed by: Boyce Shilling, CRNAPre-anesthesia Checklist: Patient identified, Emergency Drugs available, Suction available, Timeout performed and Patient being monitored Patient Re-evaluated:Patient Re-evaluated prior to induction Oxygen  Delivery Method: Nasal cannula Induction Type: IV induction Dental Injury: Teeth and Oropharynx as per pre-operative assessment

## 2023-10-25 ENCOUNTER — Encounter (HOSPITAL_COMMUNITY): Payer: Self-pay | Admitting: Ophthalmology

## 2023-11-01 ENCOUNTER — Other Ambulatory Visit: Payer: Self-pay | Admitting: Physical Medicine and Rehabilitation

## 2023-11-02 ENCOUNTER — Ambulatory Visit: Admitting: Gastroenterology

## 2023-11-03 ENCOUNTER — Other Ambulatory Visit: Payer: Self-pay | Admitting: Gastroenterology

## 2023-11-03 DIAGNOSIS — K219 Gastro-esophageal reflux disease without esophagitis: Secondary | ICD-10-CM

## 2023-11-09 DIAGNOSIS — E113512 Type 2 diabetes mellitus with proliferative diabetic retinopathy with macular edema, left eye: Secondary | ICD-10-CM | POA: Diagnosis not present

## 2023-11-09 DIAGNOSIS — H2521 Age-related cataract, morgagnian type, right eye: Secondary | ICD-10-CM | POA: Diagnosis not present

## 2023-11-10 DIAGNOSIS — Z6823 Body mass index (BMI) 23.0-23.9, adult: Secondary | ICD-10-CM | POA: Diagnosis not present

## 2023-11-10 DIAGNOSIS — J4520 Mild intermittent asthma, uncomplicated: Secondary | ICD-10-CM | POA: Diagnosis not present

## 2023-11-14 NOTE — Op Note (Signed)
 Alexandria Webster 11/14/2023 Diagnosis: Morgagnian CATARACT RIGHT EYE, Vitreous opacities right eye   Procedure: Complex cataract EXTRACTION, Implant of intraocular lens, explant of intraocular lens, Vitrectomy, (Right)  Operative Eye:  right eye  Surgeon: Onesimo Brendolyn Blanch Estimated Blood Loss: minimal Specimens for Pathology:  None Complications: none   The  patient was prepped and draped in the usual fashion for ocular surgery on the  right eye .  A lid speculum was placed.    Attention was directed to the cataract portion of the procedure.  A superior paracentesis wound was placed and vision blue dye was injected in the anterior chamber.  After 30 seconds it was washed out and the anterior chamber filled with dispersive viscoelastic.  A superior limbal wound was made with the keratome.  The cystotome was used to make a continuous curvilinear capsulorhexis.  The lens was hydrated and rotated in the capsule.  The Phacoemulsification probe was inserted and the lens was divided and segmentally removed from the eye.  The 1 piece IOL was placed in the capsule and the lens was not supported.  The IOL was then explanted and the superior limbal wound closed.  Attention was directed to the vitrectomy portion of the procedure.  Infusion line and trocar was placed at the 8 o'clock position approximately 3.5 mm from the surgical limbus.   The infusion line was allowed to run and then clamped when placed at the cannula opening. The line was inserted and secured to the drape with an adhesive strip.   Active trocars/cannula were placed at the 10 and 2 o'clock positions approximately 3.5 mm from the surgical limbus. The cannula was visualized in the vitreous cavity.  The light pipe and vitreous cutter were inserted into the vitreous cavity and the vitrector was used to remove retained silicone oil from the eye.  Additional vitreous opacities were removed from the eye.  A partial air-fluid exchange was  performed.  The superior cannulas were sequentially removed with concommitant tamponade using a cotton tipped applicator and noted to be air tight.  The infusion line and trocar were removed and the sclerotomy was noted to be air tight with normal intraocular pressure by digital palpapation.  Subconjunctival injections of  Dexamethasone  4mg /58ml was placed in the infero-medial quadrant.   The speculum and drapes were removed and the eye was patched with Polymixin/Bacitracin  ophthalmic ointment. An eye shield was placed and the patient was transferred alert and conversant with stable vital signs to the post operative recovery area.  The patient tolerated the procedure well and no complications were noted.  Onesimo Brendolyn Blanch MD

## 2023-11-24 DIAGNOSIS — Z419 Encounter for procedure for purposes other than remedying health state, unspecified: Secondary | ICD-10-CM | POA: Diagnosis not present

## 2023-11-27 ENCOUNTER — Encounter (HOSPITAL_COMMUNITY): Payer: Self-pay | Admitting: Ophthalmology

## 2023-11-27 ENCOUNTER — Other Ambulatory Visit: Payer: Self-pay

## 2023-11-27 ENCOUNTER — Telehealth (HOSPITAL_COMMUNITY): Payer: Self-pay

## 2023-11-27 ENCOUNTER — Ambulatory Visit (HOSPITAL_COMMUNITY): Attending: Physical Medicine and Rehabilitation

## 2023-11-27 NOTE — Therapy (Deleted)
 OUTPATIENT PHYSICAL THERAPY CERVICAL EVALUATION   Patient Name: Alexandria Webster MRN: [id number] DOB:03/14/1966, 58 y.o., female Today's Date: 11/27/2023  END OF SESSION:   Past Medical History:  Diagnosis Date   Asthma    COPD (chronic obstructive pulmonary disease) (HCC)    DDD (degenerative disc disease), lumbar    Depression    Diabetes mellitus    Gastroparesis    GERD without esophagitis    High cholesterol    Hypertension    Hypothyroidism    PONV (postoperative nausea and vomiting)    PVD (peripheral vascular disease) (HCC)    Wears glasses    Past Surgical History:  Procedure Laterality Date   AIR/FLUID EXCHANGE Right 11/22/2022   Procedure: AIR/FLUID EXCHANGE;  Surgeon: Tobie Baptist, MD;  Location: [facility] OR;  Service: Ophthalmology;  Laterality: Right;   AORTA - BILATERAL FEMORAL ARTERY BYPASS GRAFT N/A 09/27/2018   Procedure: Redo Exposure  AORTOBIFEMORAL Bypass and bilateral femoral Artery ; Redo Aortobifemoral  BYPASS GRAFT.;  Surgeon: Gretta Lonni PARAS, MD;  Location: [facility] OR;  Service: Vascular;  Laterality: N/A;   BACK SURGERY     BIOPSY  10/14/2021   Procedure: BIOPSY;  Surgeon: Cindie Carlin POUR, DO;  [facility];  Service: Endoscopy;;   CATARACT EXTRACTION EXTRACAPSULAR Left 04/13/2023   Procedure: CATARACT EXTRACTION EXTRACAPSULAR WITH INTRAOCULAR LENS PLACEMENT (IOC);  Surgeon: Tobie Baptist, MD;  Location: [facility] OR;  Service: Ophthalmology;  Laterality: Left;  MAC WITH BLOCK   COLONOSCOPY WITH PROPOFOL  N/A 10/14/2021   Procedure: COLONOSCOPY WITH PROPOFOL ;  Surgeon: Cindie Carlin POUR, DO;  [facility];  Service: Endoscopy;  Laterality: N/A;  8:30am   ESOPHAGOGASTRODUODENOSCOPY (EGD) WITH PROPOFOL  N/A 10/14/2021   Procedure: ESOPHAGOGASTRODUODENOSCOPY (EGD) WITH PROPOFOL ;  Surgeon: Cindie Carlin POUR, DO;  [facility];  Service: Endoscopy;  Laterality: N/A;   FEMORAL ARTERY STENT  right leg   INJECTION OF SILICONE OIL  Left 09/06/2022   Procedure: INJECTION OF SILICONE OIL;  Surgeon: Tobie Baptist, MD;  Location: [facility] OR;  Service: Ophthalmology;  Laterality: Left;   INJECTION OF SILICONE OIL Right 11/22/2022   Procedure: INJECTION OF SILICONE OIL;  Surgeon: Tobie Baptist, MD;  Location: [facility] OR;  Service: Ophthalmology;  Laterality: Right;   MEMBRANE PEEL Left 09/06/2022   Procedure: MEMBRANECTOMY;  Surgeon: Tobie Baptist, MD;  Location: [facility] OR;  Service: Ophthalmology;  Laterality: Left;   PARS PLANA VITRECTOMY Left 09/06/2022   Procedure: PARS PLANA VITRECTOMY WITH 25 GAUGE;  Surgeon: Tobie Baptist, MD;  Location: [facility] OR;  Service: Ophthalmology;  Laterality: Left;   PARS PLANA VITRECTOMY Left 02/14/2023   Procedure: PARS PLANA VITRECTOMY WITH 25 GAUGE;  Surgeon: Tobie Baptist, MD;  Location: [facility] OR;  Service: Ophthalmology;  Laterality: Left;   PARS PLANA VITRECTOMY W/ ENDOLASER PANRETINAL PHOTOCOAGULATION Right 09/12/2023   Procedure: PARS PLANA VITRECTOMY WITH 25 GAUGE WITH ENDOLASER PANRETINAL PHOTOCOAGULATION;  Surgeon: Tobie Baptist, MD;  Location: [facility] OR;  Service: Ophthalmology;  Laterality: Right;  MAC WITH BLOCK   PHOTOCOAGULATION WITH LASER Left 09/06/2022   Procedure: PHOTOCOAGULATION WITH LASER;  Surgeon: Tobie Baptist, MD;  Location: [facility] OR;  Service: Ophthalmology;  Laterality: Left;   PHOTOCOAGULATION WITH LASER Right 11/22/2022   Procedure: PHOTOCOAGULATION WITH LASER;  Surgeon: Tobie Baptist, MD;  Location: [facility] OR;  Service: Ophthalmology;  Laterality: Right;   PHOTOCOAGULATION WITH LASER Left 02/14/2023   Procedure: PHOTOCOAGULATION WITH LASER;  Surgeon: Tobie Baptist, MD;  Location: [facility] OR;  Service: Ophthalmology;  Laterality: Left;  POSTERIOR CERVICAL FUSION/FORAMINOTOMY N/A 07/19/2022   Procedure: Posterior cervical fusion with lateral mass fixation - Cervical four-cervical five, Cervical five-Cervical six - Cervical six-Cervical seven - Cervical seven-Thoracic one with laminectomy;   Surgeon: Louis Shove, MD;  Location: [facility] OR;  Service: Neurosurgery;  Laterality: N/A;   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   Procedure: REPAIR OF COMPLEX TRACTION RETINAL DETACHMENT;  Surgeon: Tobie Baptist, MD;  Location: [facility] OR;  Service: Ophthalmology;  Laterality: Left;  MAC WITH BLOCK   REPAIR OF COMPLEX TRACTION RETINAL DETACHMENT Right 11/22/2022   Procedure: REPAIR OF COMPLEX TRACTION RETINAL DETACHMENT;  Surgeon: Tobie Baptist, MD;  Location: [facility] OR;  Service: Ophthalmology;  Laterality: Right;  MAC WITH BLOCK   SILICON OIL REMOVAL Left 02/14/2023   Procedure: SILICON OIL REMOVAL;  Surgeon: Tobie Baptist, MD;  Location: [facility] OR;  Service: Ophthalmology;  Laterality: Left;   TUBAL LIGATION     VITRECTOMY AND CATARACT Right 10/24/2023   Procedure: EXTRACTION, CATARACT, WITH VITRECTOMY;  Surgeon: Tobie Baptist, MD;  Location: [facility] OR;  Service: Ophthalmology;  Laterality: Right;  BLOCK   Patient Active Problem List   Diagnosis Date Noted   Spasticity 08/25/2023   Post-operative pain 08/15/2022   Orthostatic hypotension 08/15/2022   Impaired gait and mobility 08/15/2022   Mild cognitive impairment 08/01/2022   Cervical myelopathy (HCC) 07/22/2022   Stenosis of cervical spine with myelopathy (HCC) 07/19/2022   Degeneration of cervical intervertebral disc 08/08/2021   Syncope and collapse 08/06/2021   Hypomagnesemia 08/06/2021   Hypothyroidism 08/06/2021   Allergic rhinitis 08/06/2021   AKI (acute kidney injury) (HCC) 08/06/2021   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/27/2018   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   Gastritis 10/01/2015   Gastroparesis due to DM (HCC) 10/01/2015   Personal history of noncompliance with medical treatment, presenting hazards to health 09/14/2015   Hypokalemia 11/13/2012   Constipation 11/09/2012   Facial cellulitis 11/07/2012   Leukocytosis  11/07/2012   Type 2 diabetes mellitus (HCC) 11/07/2012   Asthma 11/07/2012   PVD (peripheral vascular disease) (HCC) 11/07/2012   Hyponatremia 11/07/2012   Tachycardia 11/07/2012   Nausea & vomiting 11/07/2012   Essential hypertension, benign     PCP: ***  REFERRING PROVIDER: Cornelio Bouchard, MD  REFERRING DIAG: G95.9 (ICD-10-CM) - Cervical myelopathy (HCC) R26.89 (ICD-10-CM) - Impaired gait and mobility R25.2 (ICD-10-CM) - Spasticity  THERAPY DIAG:  No diagnosis found.  Rationale for Evaluation and Treatment: {HABREHAB:27488}  ONSET DATE: ***  SUBJECTIVE:  SUBJECTIVE STATEMENT: *** Hand dominance: {MISC; OT HAND DOMINANCE:[phone]}  PERTINENT HISTORY:  ***  PAIN:  Are you having pain? {OPRCPAIN:27236}  PRECAUTIONS: {Therapy precautions:24002}  RED FLAGS: {PT Red Flags:29287}     WEIGHT BEARING RESTRICTIONS: {Yes ***/No:24003}  FALLS:  Has patient fallen in last 6 months? {fallsyesno:27318}  LIVING ENVIRONMENT: Lives with: {OPRC lives with:25569::lives with their family} Lives in: {Lives in:25570} Stairs: {opstairs:27293} Has following equipment at home: {Assistive devices:23999}  OCCUPATION: ***  PLOF: {PLOF:24004}  PATIENT GOALS: ***  NEXT MD VISIT: ***  OBJECTIVE:  Note: Objective measures were completed at Evaluation unless otherwise noted.  DIAGNOSTIC FINDINGS:  IMPRESSION: 1. Multilevel cervical spondylosis with resultant diffuse spinal stenosis, severe in nature at C5-6 and C6-7. Patchy cord signal changes at these levels consistent with myelomalacia. Neurosurgical/spine surgery consultation recommended. 2. Question additional subtle cord signal abnormality at the level of C3-4, also suspicious for myelomalacia. 3. Multifactorial degenerative  changes with resultant multilevel foraminal narrowing as above. Notable findings include severe bilateral C4 foraminal stenosis, moderate left with severe right C5 foraminal narrowing, severe bilateral C6 and C7 foraminal stenosis, with moderate left C8 foraminal narrowing.  FINDINGS: Three fluoroscopic spot views of the cervical spine obtained in the operating room in frontal and lateral projections. Rod and pedicle screw fixation from C3 through C7. Fluoroscopy time 21 seconds. Dose 3.02 mGy.   IMPRESSION: Intraoperative fluoroscopy during cervical fusion  PATIENT SURVEYS:  {rehab surveys:24030}  COGNITION: Overall cognitive status: {cognition:24006}  SENSATION: {sensation:27233}  POSTURE: {posture:25561}  PALPATION: ***   CERVICAL ROM:   {AROM/PROM:27142} ROM A/PROM (deg) eval  Flexion   Extension   Right lateral flexion   Left lateral flexion   Right rotation   Left rotation    (Blank rows = not tested)  UPPER EXTREMITY ROM:  {AROM/PROM:27142} ROM Right eval Left eval  Shoulder flexion    Shoulder extension    Shoulder abduction    Shoulder adduction    Shoulder extension    Shoulder internal rotation    Shoulder external rotation    Elbow flexion    Elbow extension    Wrist flexion    Wrist extension    Wrist ulnar deviation    Wrist radial deviation    Wrist pronation    Wrist supination     (Blank rows = not tested)  UPPER EXTREMITY MMT:  MMT Right eval Left eval  Shoulder flexion    Shoulder extension    Shoulder abduction    Shoulder adduction    Shoulder extension    Shoulder internal rotation    Shoulder external rotation    Middle trapezius    Lower trapezius    Elbow flexion    Elbow extension    Wrist flexion    Wrist extension    Wrist ulnar deviation    Wrist radial deviation    Wrist pronation    Wrist supination    Grip strength     (Blank rows = not tested)  CERVICAL SPECIAL TESTS:  {Cervical special  tests:25246}  FUNCTIONAL TESTS:  {Functional tests:24029}  TREATMENT DATE: ***  PATIENT EDUCATION:  Education details: *** Person educated: {Person educated:25204} Education method: {Education Method:25205} Education comprehension: {Education Comprehension:25206}  HOME EXERCISE PROGRAM: ***  ASSESSMENT:  CLINICAL IMPRESSION: Patient is a *** y.o. *** who was seen today for physical therapy evaluation and treatment for ***.   OBJECTIVE IMPAIRMENTS: {opptimpairments:25111}.   ACTIVITY LIMITATIONS: {activitylimitations:27494}  PARTICIPATION LIMITATIONS: {participationrestrictions:25113}  PERSONAL FACTORS: {Personal factors:25162} are also affecting patient's functional outcome.   REHAB POTENTIAL: {rehabpotential:25112}  CLINICAL DECISION MAKING: {clinical decision making:25114}  EVALUATION COMPLEXITY: {Evaluation complexity:25115}   GOALS: Goals reviewed with patient? {yes/no:20286}  SHORT TERM GOALS: Target date: ***  *** Baseline:  Goal status: INITIAL  2.  *** Baseline:  Goal status: INITIAL  3.  *** Baseline:  Goal status: INITIAL  4.  *** Baseline:  Goal status: INITIAL  5.  *** Baseline:  Goal status: INITIAL  6.  *** Baseline:  Goal status: INITIAL  LONG TERM GOALS: Target date: ***  *** Baseline:  Goal status: INITIAL  2.  *** Baseline:  Goal status: INITIAL  3.  *** Baseline:  Goal status: INITIAL  4.  *** Baseline:  Goal status: INITIAL  5.  *** Baseline:  Goal status: INITIAL  6.  *** Baseline:  Goal status: INITIAL   PLAN:  PT FREQUENCY: {rehab frequency:25116}  PT DURATION: {rehab duration:25117}  PLANNED INTERVENTIONS: {rehab planned interventions:25118::97110-Therapeutic exercises,97530- Therapeutic [phone]- Neuromuscular re-education,97535- Self Rjmz,02859- Manual  therapy,Patient/Family education}  PLAN FOR NEXT SESSION: ***   Zyanne Schumm E Powell-Butler, PT 11/27/2023, 9:29 AM

## 2023-11-27 NOTE — Progress Notes (Signed)
 Anesthesia Chart Review: SAME DAY WORK-UP  Case: [id number] Date/Time: 11/28/23 1445   Procedure: VITRECTOMY, PARS PLANA APPROACH, WITH IOL INSERTION OR REPLACEMENT (Right) - WITH BLOCK INSERTION OF LENS AFTER CQATARACT REMOVAL   Anesthesia type: Monitor Anesthesia Care   Diagnosis: Aphakia of right eye [H27.01]   Pre-op diagnosis: Aphakia of right eye   [facility] ROOM 08 / [facility]   Surgeons: Tobie Baptist, MD       DISCUSSION: Patient is a 58 year old female scheduled for the above procedure.     History includes former smoker (but current smokeless tobacco use), post-operative N/V, HTN (but also on midodrine , DM2, PVD (PTA infrarenal aorta 02/27/02; AFBG 07/28/06; redo AFBG 09/27/18), COPD, asthma, hypothyroidism, GERD, gastroparesis, spinal surgery (Left L4-5 microdiscectomy 07/08/04; C5-7 laminectomy, C4-T1 posterolateral arthrodesis 07/19/22 for myelopathy), eye surgery (s/p repair of right retinal detachment, pars plana vitrectomy 09/06/22, 11/22/22; left eye pars plana vitrectomy, endolaser, removal of retained silicone oil 02/14/23 & on right 09/12/23; left cataract extraction 04/13/23 & on right 10/24/23 ).    Diabetes is followed by endocrinology. A1c 5.2% on 01/10/2023. History of gastroparesis. She is on Trulicity  1.5 mg weekly. Last dose 11/21/2023.    Previously PAD and carotid/vertebral stenosis followed by vascular surgery, last in May 2023. Carotid US  in July 2024, ordered by primary care, showed moderate bilateral carotid artery atherosclerosis with no significant stenosis, known right vertebral artery proximal occlusion but with appearance of reconstitution distally, left vertebral artery antegrade flow.    Anesthesia team to evaluate on the day of surgery and definitive anesthesia plan to be determined at that time.    VS: LMP 01/21/2012   Wt Readings from Last 3 Encounters:  10/24/23 47.2 kg  10/06/23 47.2 kg  09/12/23 47.6 kg   BP Readings from Last 3 Encounters:  10/24/23  105/63  10/06/23 122/72  09/12/23 135/76   Pulse Readings from Last 3 Encounters:  10/24/23 69  10/06/23 74  09/12/23 66    PROVIDERS: Marvine Rush, MD is listed as PCP. She is scheduled to establish care with Bevely Doffing, FNP on 03/05/2024.  Oris Boas, MD is vascular surgeon (retired), last visit 08/01/2021. Last carotid US  ordered by primary care. Therisa Folks, NP is endocrinology provider Gaynell Clos, MD is nephrologist Louis Shove, MD is neurosurgeon   LABS: Most recent labs results in [facility] include: Lab Results  Component Value Date   WBC 7.9 10/24/2023   HGB 14.5 10/24/2023   HCT 42.2 10/24/2023   PLT 206 10/24/2023   GLUCOSE 252 (H) 09/27/2023   ALT 13 09/27/2023   AST 18 09/27/2023   NA 138 09/27/2023   K 4.7 09/27/2023   CL 98 09/27/2023   CREATININE 0.91 09/27/2023   BUN 24 09/27/2023   CO2 20 09/27/2023   TSH 3.330 09/27/2023    IMAGES: MRI Brain 06/20/22: IMPRESSION: 1. No acute intracranial abnormality. 2. Age-related cerebral atrophy with mild chronic small vessel ischemic disease, with a few small remote lacunar infarcts about the thalami and cerebellum.     EKG: EKG 09/12/2023: Sinus bradycardia at 59 bpm Otherwise normal ECG When compared with ECG of 14-Jul-2022 10:52, Premature ventricular complexes NO LONGER PRESENT Confirmed by Anner Lenis ([id number]) on 09/12/2023 3:05:31 PM   CV: US  Carotid 10/04/2022: IMPRESSION: Moderate bilateral carotid atherosclerosis. No significant stenosis. Degree of narrowing less than 50% by ultrasound criteria bilaterally. Suspect right vertebral artery distal reconstitution. (Known occlusion proximally by CTA from 08/07/2021. More distal aspect is visualized and  limited assessment but some reconstituted antegrade flow is detected.) Left vertebral artery demonstrates normal antegrade flow.     Echo 08/06/21: IMPRESSIONS   1. Left ventricular ejection fraction, by estimation, is 65 to 70%. Left   ventricular ejection fraction by 2D MOD biplane is 68.5 %. The left  ventricle has normal function. The left ventricle has no regional wall  motion abnormalities. Left ventricular  diastolic parameters are consistent with Grade I diastolic dysfunction  (impaired relaxation).   2. Right ventricular systolic function is normal. The right ventricular  size is normal. Tricuspid regurgitation signal is inadequate for assessing  PA pressure.   3. The mitral valve is grossly normal. No evidence of mitral valve  regurgitation.   4. The aortic valve is tricuspid. Aortic valve regurgitation is not  visualized.   5. The inferior vena cava is normal in size with greater than 50%  respiratory variability, suggesting right atrial pressure of 3 mmHg.  - Comparison(s): Changes from prior study are noted. 09/06/2018: LVEF 60-65%, grade 1 DD.     Past Medical History:  Diagnosis Date   Asthma    COPD (chronic obstructive pulmonary disease) (HCC)    DDD (degenerative disc disease), lumbar    Depression    Diabetes mellitus    Gastroparesis    GERD without esophagitis    High cholesterol    Hypertension    Hypothyroidism    PONV (postoperative nausea and vomiting)    PVD (peripheral vascular disease) (HCC)    Wears glasses     Past Surgical History:  Procedure Laterality Date   AIR/FLUID EXCHANGE Right 11/22/2022   Procedure: AIR/FLUID EXCHANGE;  Surgeon: Tobie Baptist, MD;  Location: [facility] OR;  Service: Ophthalmology;  Laterality: Right;   AORTA - BILATERAL FEMORAL ARTERY BYPASS GRAFT N/A 09/27/2018   Procedure: Redo Exposure  AORTOBIFEMORAL Bypass and bilateral femoral Artery ; Redo Aortobifemoral  BYPASS GRAFT.;  Surgeon: Gretta Lonni PARAS, MD;  Location: [facility] OR;  Service: Vascular;  Laterality: N/A;   BACK SURGERY     BIOPSY  10/14/2021   Procedure: BIOPSY;  Surgeon: Cindie Carlin POUR, DO;  [facility];  Service: Endoscopy;;   CATARACT EXTRACTION EXTRACAPSULAR Left 04/13/2023    Procedure: CATARACT EXTRACTION EXTRACAPSULAR WITH INTRAOCULAR LENS PLACEMENT (IOC);  Surgeon: Tobie Baptist, MD;  Location: [facility] OR;  Service: Ophthalmology;  Laterality: Left;  MAC WITH BLOCK   COLONOSCOPY WITH PROPOFOL  N/A 10/14/2021   Procedure: COLONOSCOPY WITH PROPOFOL ;  Surgeon: Cindie Carlin POUR, DO;  [facility];  Service: Endoscopy;  Laterality: N/A;  8:30am   ESOPHAGOGASTRODUODENOSCOPY (EGD) WITH PROPOFOL  N/A 10/14/2021   Procedure: ESOPHAGOGASTRODUODENOSCOPY (EGD) WITH PROPOFOL ;  Surgeon: Cindie Carlin POUR, DO;  [facility];  Service: Endoscopy;  Laterality: N/A;   FEMORAL ARTERY STENT  right leg   INJECTION OF SILICONE OIL Left 09/06/2022   Procedure: INJECTION OF SILICONE OIL;  Surgeon: Tobie Baptist, MD;  Location: [facility] OR;  Service: Ophthalmology;  Laterality: Left;   INJECTION OF SILICONE OIL Right 11/22/2022   Procedure: INJECTION OF SILICONE OIL;  Surgeon: Tobie Baptist, MD;  Location: [facility] OR;  Service: Ophthalmology;  Laterality: Right;   MEMBRANE PEEL Left 09/06/2022   Procedure: MEMBRANECTOMY;  Surgeon: Tobie Baptist, MD;  Location: [facility] OR;  Service: Ophthalmology;  Laterality: Left;   PARS PLANA VITRECTOMY Left 09/06/2022   Procedure: PARS PLANA VITRECTOMY WITH 25 GAUGE;  Surgeon: Tobie Baptist, MD;  Location: [facility] OR;  Service: Ophthalmology;  Laterality: Left;  PARS PLANA VITRECTOMY Left 02/14/2023   Procedure: PARS PLANA VITRECTOMY WITH 25 GAUGE;  Surgeon: Tobie Baptist, MD;  Location: [facility] OR;  Service: Ophthalmology;  Laterality: Left;   PARS PLANA VITRECTOMY W/ ENDOLASER PANRETINAL PHOTOCOAGULATION Right 09/12/2023   Procedure: PARS PLANA VITRECTOMY WITH 25 GAUGE WITH ENDOLASER PANRETINAL PHOTOCOAGULATION;  Surgeon: Tobie Baptist, MD;  Location: [facility] OR;  Service: Ophthalmology;  Laterality: Right;  MAC WITH BLOCK   PHOTOCOAGULATION WITH LASER Left 09/06/2022   Procedure: PHOTOCOAGULATION WITH LASER;  Surgeon: Tobie Baptist, MD;  Location: [facility] OR;   Service: Ophthalmology;  Laterality: Left;   PHOTOCOAGULATION WITH LASER Right 11/22/2022   Procedure: PHOTOCOAGULATION WITH LASER;  Surgeon: Tobie Baptist, MD;  Location: [facility] OR;  Service: Ophthalmology;  Laterality: Right;   PHOTOCOAGULATION WITH LASER Left 02/14/2023   Procedure: PHOTOCOAGULATION WITH LASER;  Surgeon: Tobie Baptist, MD;  Location: [facility] OR;  Service: Ophthalmology;  Laterality: Left;   POSTERIOR CERVICAL FUSION/FORAMINOTOMY N/A 07/19/2022   Procedure: Posterior cervical fusion with lateral mass fixation - Cervical four-cervical five, Cervical five-Cervical six - Cervical six-Cervical seven - Cervical seven-Thoracic one with laminectomy;  Surgeon: Louis Shove, MD;  Location: [facility] OR;  Service: Neurosurgery;  Laterality: N/A;   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   Procedure: REPAIR OF COMPLEX TRACTION RETINAL DETACHMENT;  Surgeon: Tobie Baptist, MD;  Location: [facility] OR;  Service: Ophthalmology;  Laterality: Left;  MAC WITH BLOCK   REPAIR OF COMPLEX TRACTION RETINAL DETACHMENT Right 11/22/2022   Procedure: REPAIR OF COMPLEX TRACTION RETINAL DETACHMENT;  Surgeon: Tobie Baptist, MD;  Location: [facility] OR;  Service: Ophthalmology;  Laterality: Right;  MAC WITH BLOCK   SILICON OIL REMOVAL Left 02/14/2023   Procedure: SILICON OIL REMOVAL;  Surgeon: Tobie Baptist, MD;  Location: [facility] OR;  Service: Ophthalmology;  Laterality: Left;   TUBAL LIGATION     VITRECTOMY AND CATARACT Right 10/24/2023   Procedure: EXTRACTION, CATARACT, WITH VITRECTOMY;  Surgeon: Tobie Baptist, MD;  Location: [facility] OR;  Service: Ophthalmology;  Laterality: Right;  BLOCK    MEDICATIONS: No current facility-administered medications for this encounter.    albuterol  (PROVENTIL ) 2 MG tablet   albuterol  (VENTOLIN  HFA) 108 (90 Base) MCG/ACT inhaler   aspirin  325 MG tablet   cetirizine (ZYRTEC) 10 MG tablet   docusate sodium  (COLACE) 100 MG capsule   Dulaglutide  (TRULICITY ) 1.5 MG/0.5ML SOAJ    esomeprazole  (NEXIUM ) 40 MG capsule   hydrOXYzine  (ATARAX ) 25 MG tablet   ipratropium (ATROVENT ) 0.03 % nasal spray   levothyroxine  (SYNTHROID ) 50 MCG tablet   LINZESS  290 MCG CAPS capsule   lovastatin  (MEVACOR ) 20 MG tablet   meclizine  (ANTIVERT ) 25 MG tablet   midodrine  (PROAMATINE ) 10 MG tablet   PARoxetine  (PAXIL ) 20 MG tablet   polyethylene glycol (MIRALAX  / GLYCOLAX ) 17 g packet   timolol (TIMOPTIC) 0.25 % ophthalmic solution   traZODone  (DESYREL ) 150 MG tablet    Isaiah Ruder, PA-C Surgical Short Stay/Anesthesiology [facility] Phone [phone] [facility] Phone [phone] 11/27/2023 2:01 PM

## 2023-11-27 NOTE — Telephone Encounter (Signed)
 Called patient preferred number listed. Phone number belonging to Jonette Lever (patient's daughter). Reminded of missed evaluation this date. Left message with number of front desk for patient and/or daughter to reschedule PT evaluation if still interested.    3:33 PM, 11/27/23 Rosaria Settler, PT, DPT [facility]

## 2023-11-27 NOTE — Progress Notes (Signed)
 SDW call  Patient and her daughter Alexandria Webster were given pre-op instructions over the phone. They verbalized understanding of instructions provided.     PCP - Roderick Sheffield, [facility], [place] Cardiologist -  Pulmonary:    PPM/ICD - denies Device Orders - na Rep Notified - na   Chest x-ray - 07/25/2022 EKG -  09/12/2023 Stress Test - ECHO - 08/06/2021 Cardiac Cath -   Sleep Study/sleep apnea/CPAP: denies  Type II diabetic. A1C 5.7 on 07/19/2023 Fasting Blood sugar range: 96-120 How often check sugars: daily  GLP1: Trulicity , states last dose 11/21/2023   Blood Thinner Instructions: denies Aspirin  Instructions:denies   ERAS Protcol - Clears until 1200   Anesthesia review: Yes. COPD, PVD, HTN, DM, HLD   Patient denies shortness of breath, fever, cough and chest pain over the phone call  Your procedure is scheduled on Tuesday November 28, 2023  Report to [facility] Main Entrance A at  1230 PM  then check in with the Admitting office.  Call this number if you have problems the morning of surgery:  [phone]   If you have any questions prior to your surgery date call [phone]: Open Monday-Friday 8am-4pm If you experience any cold or flu symptoms such as cough, fever, chills, shortness of breath, etc. between now and your scheduled surgery, please notify us  at the above number     Remember:  Do not eat after midnight the night before your surgery  You may drink clear liquids until  1200 noon the day of your surgery.   Clear liquids allowed are: Water , Non-Citrus Juices (without pulp), Carbonated Beverages, Clear Tea, Black Coffee ONLY (NO MILK, CREAM OR POWDERED CREAMER of any kind), and Gatorade   Take these medicines the morning of surgery with A SIP OF WATER :  Albuterol , nexium , hydroxyzine , synthroid , lovastatin , midodrine   As needed: Albuterol  inhaler, atrovent  nasal, meclizine   As of today, STOP taking any Aspirin  (unless otherwise instructed  by your surgeon) Aleve, Naproxen, Ibuprofen , Motrin , Advil , Goody's, BC's, all herbal medications, fish oil, and all vitamins.

## 2023-11-27 NOTE — Anesthesia Preprocedure Evaluation (Signed)
 Anesthesia Evaluation    Airway        Dental   Pulmonary former smoker          Cardiovascular hypertension,      Neuro/Psych    GI/Hepatic   Endo/Other  diabetes    Renal/GU      Musculoskeletal   Abdominal   Peds  Hematology   Anesthesia Other Findings   Reproductive/Obstetrics                              Anesthesia Physical Anesthesia Plan  ASA:   Anesthesia Plan:    Post-op Pain Management:    Induction:   PONV Risk Score and Plan:   Airway Management Planned:   Additional Equipment:   Intra-op Plan:   Post-operative Plan:   Informed Consent:   Plan Discussed with:   Anesthesia Plan Comments: (PAT note written 11/27/2023 by Lief Palmatier, PA-C.  )        Anesthesia Quick Evaluation

## 2023-11-28 ENCOUNTER — Encounter (HOSPITAL_COMMUNITY): Payer: Self-pay | Admitting: Vascular Surgery

## 2023-11-28 ENCOUNTER — Ambulatory Visit (HOSPITAL_COMMUNITY): Admission: RE | Admit: 2023-11-28 | Source: Home / Self Care | Admitting: Ophthalmology

## 2023-11-28 HISTORY — DX: Unspecified osteoarthritis, unspecified site: M19.90

## 2023-11-28 SURGERY — VITRECTOMY, PARS PLANA APPROACH, WITH IOL INSERTION OR REPLACEMENT
Anesthesia: Monitor Anesthesia Care | Laterality: Right

## 2023-11-28 NOTE — Progress Notes (Signed)
 Patient's daughter called stating patient has been sick on her stomach and vomiting since last night. Encouraged her to call Dr. Anthony office to make him aware. Daughter stated she would call him at this time.

## 2023-12-11 ENCOUNTER — Encounter: Admitting: Physical Medicine and Rehabilitation

## 2023-12-13 DIAGNOSIS — E113512 Type 2 diabetes mellitus with proliferative diabetic retinopathy with macular edema, left eye: Secondary | ICD-10-CM | POA: Diagnosis not present

## 2023-12-14 ENCOUNTER — Ambulatory Visit: Admitting: Gastroenterology

## 2023-12-14 ENCOUNTER — Encounter: Payer: Self-pay | Admitting: Gastroenterology

## 2023-12-15 ENCOUNTER — Other Ambulatory Visit: Payer: Self-pay

## 2023-12-15 ENCOUNTER — Encounter (HOSPITAL_COMMUNITY): Payer: Self-pay | Admitting: Ophthalmology

## 2023-12-15 NOTE — Progress Notes (Signed)
  SDW call   Patient and her daughter Jonette were given pre-op instructions over the phone. They verbalized understanding of instructions provided.     PCP - Roderick Sheffield, [facility], [place] Cardiologist - Denies   PPM/ICD - denies Device Orders - na Rep Notified - na   Chest x-ray - 07/25/2022 EKG -  09/12/2023 Stress Test - ECHO - 08/06/2021 Cardiac Cath - Denies   Sleep Study/sleep apnea/CPAP: denies   Type II diabetic. A1C 5.7 on 07/19/2023 Fasting Blood sugar range: 90's How often check sugars: daily   GLP1: Trulicity , states last dose 11/21/2023   Blood Thinner Instructions: denies Aspirin  Instructions:denies   ERAS Protcol - Clears until 1200   Anesthesia review: Yes. COPD, PVD, HTN, DM, HLD   Patient denies shortness of breath, fever, cough and chest pain over the phone call   Your procedure is scheduled on Tuesday December 19, 2023             Report to [facility] Main Entrance A at  1:30 PM  then check in with the Admitting office.             Call this number if you have problems the morning of surgery:             [phone]    If you have any questions prior to your surgery date call [phone]: Open Monday-Friday 8am-4pm If you experience any cold or flu symptoms such as cough, fever, chills, shortness of breath, etc. between now and your scheduled surgery, please notify us  at the above number                  Remember:             Do not eat after midnight the night before your surgery   You may drink clear liquids until  1:00 PM the day of your surgery.   Clear liquids allowed are: Water , Non-Citrus Juices (without pulp), Carbonated Beverages, Clear Tea, Black Coffee ONLY (NO MILK, CREAM OR POWDERED CREAMER of any kind), and Gatorade              Take these medicines the morning of surgery with A SIP OF WATER :  Albuterol , nexium , hydroxyzine , synthroid , lovastatin , midodrine    As needed: Albuterol  inhaler, atrovent  nasal,  meclizine    As of today, STOP taking any Aspirin  (unless otherwise instructed by your surgeon) Aleve, Naproxen, Ibuprofen , Motrin , Advil , Goody's, BC's, all herbal medications, fish oil, and all vitamins.

## 2023-12-19 ENCOUNTER — Ambulatory Visit (HOSPITAL_COMMUNITY): Payer: Self-pay | Admitting: Physician Assistant

## 2023-12-19 ENCOUNTER — Encounter (HOSPITAL_COMMUNITY): Payer: Self-pay | Admitting: Ophthalmology

## 2023-12-19 ENCOUNTER — Ambulatory Visit (HOSPITAL_COMMUNITY)
Admission: RE | Admit: 2023-12-19 | Discharge: 2023-12-19 | Disposition: A | Discharge status: Home / Self Care | Attending: Ophthalmology | Admitting: Ophthalmology

## 2023-12-19 ENCOUNTER — Other Ambulatory Visit: Payer: Self-pay

## 2023-12-19 ENCOUNTER — Encounter (HOSPITAL_COMMUNITY)
Admission: RE | Disposition: A | Discharge status: Home / Self Care | Payer: Self-pay | Source: Home / Self Care | Attending: Ophthalmology

## 2023-12-19 DIAGNOSIS — Z87891 Personal history of nicotine dependence: Secondary | ICD-10-CM | POA: Insufficient documentation

## 2023-12-19 DIAGNOSIS — H2701 Aphakia, right eye: Secondary | ICD-10-CM

## 2023-12-19 DIAGNOSIS — E1151 Type 2 diabetes mellitus with diabetic peripheral angiopathy without gangrene: Secondary | ICD-10-CM | POA: Diagnosis not present

## 2023-12-19 DIAGNOSIS — I10 Essential (primary) hypertension: Secondary | ICD-10-CM | POA: Insufficient documentation

## 2023-12-19 DIAGNOSIS — J449 Chronic obstructive pulmonary disease, unspecified: Secondary | ICD-10-CM | POA: Insufficient documentation

## 2023-12-19 HISTORY — PX: PARS PLANA VITRECTOMY: SHX2166

## 2023-12-19 LAB — GLUCOSE, CAPILLARY
Glucose-Capillary: 201 mg/dL — ABNORMAL HIGH (ref 70–99)
Glucose-Capillary: 110 mg/dL — ABNORMAL HIGH (ref 70–99)
Glucose-Capillary: 82 mg/dL (ref 70–99)

## 2023-12-19 LAB — BASIC METABOLIC PANEL WITH GFR
Anion gap: 12 (ref 5–15)
BUN: 15 mg/dL (ref 6–20)
CO2: 23 mmol/L (ref 22–32)
Calcium: 9.6 mg/dL (ref 8.9–10.3)
Chloride: 101 mmol/L (ref 98–111)
Creatinine, Ser: 0.78 mg/dL (ref 0.44–1.00)
GFR, Estimated: >60 mL/min (ref >60)
Glucose, Bld: 194 mg/dL — ABNORMAL HIGH (ref 70–99)
Potassium: 3.7 mmol/L (ref 3.5–5.1)
Sodium: 136 mmol/L (ref 135–145)

## 2023-12-19 LAB — CBC
HCT: 41.5 % (ref 36.0–46.0)
Hemoglobin: 14.2 g/dL (ref 12.0–15.0)
MCH: 31.2 pg (ref 26.0–34.0)
MCHC: 34.2 g/dL (ref 30.0–36.0)
MCV: 91.2 fL (ref 80.0–100.0)
Platelets: 182 K/uL (ref 150–400)
RBC: 4.55 MIL/uL (ref 3.87–5.11)
RDW: 11.3 % — ABNORMAL LOW (ref 11.5–15.5)
WBC: 8.8 K/uL (ref 4.0–10.5)
nRBC: 0 % (ref 0.0–0.2)

## 2023-12-19 SURGERY — VITRECTOMY, PARS PLANA APPROACH, WITH IOL INSERTION OR REPLACEMENT
Anesthesia: General | Site: Eye | Laterality: Right

## 2023-12-19 MED ORDER — PROPOFOL 10 MG/ML IV BOLUS
INTRAVENOUS | Status: DC | PRN
  Administered 2023-12-19: 120 mg via INTRAVENOUS

## 2023-12-19 MED ORDER — LIDOCAINE HCL 2 % IJ SOLN
INTRAMUSCULAR | Status: AC
  Filled 2023-12-19: qty 20

## 2023-12-19 MED ORDER — FENTANYL CITRATE (PF) 100 MCG/2ML IJ SOLN
INTRAMUSCULAR | Status: AC
  Filled 2023-12-19: qty 2

## 2023-12-19 MED ORDER — FENTANYL CITRATE (PF) 250 MCG/5ML IJ SOLN
INTRAMUSCULAR | Status: DC | PRN
  Administered 2023-12-19: 50 ug via INTRAVENOUS

## 2023-12-19 MED ORDER — EPINEPHRINE PF 1 MG/ML IJ SOLN
INTRAMUSCULAR | Status: AC
  Filled 2023-12-19: qty 1

## 2023-12-19 MED ORDER — SODIUM HYALURONATE 10 MG/ML IO SOLUTION
PREFILLED_SYRINGE | INTRAOCULAR | Status: DC | PRN
  Administered 2023-12-19: .85 mL via INTRAOCULAR

## 2023-12-19 MED ORDER — SUCCINYLCHOLINE CHLORIDE 200 MG/10ML IV SOSY
PREFILLED_SYRINGE | INTRAVENOUS | Status: AC
  Filled 2023-12-19: qty 10

## 2023-12-19 MED ORDER — BSS PLUS IO SOLN
INTRAOCULAR | Status: DC | PRN
  Administered 2023-12-19: 1 via INTRAOCULAR

## 2023-12-19 MED ORDER — PHENYLEPHRINE 80 MCG/ML (10ML) SYRINGE FOR IV PUSH (FOR BLOOD PRESSURE SUPPORT)
PREFILLED_SYRINGE | INTRAVENOUS | Status: AC
  Filled 2023-12-19: qty 10

## 2023-12-19 MED ORDER — BSS IO SOLN
INTRAOCULAR | Status: AC
  Filled 2023-12-19: qty 15

## 2023-12-19 MED ORDER — MIDAZOLAM HCL 2 MG/2ML IJ SOLN
INTRAMUSCULAR | Status: AC
  Filled 2023-12-19: qty 2

## 2023-12-19 MED ORDER — INSULIN ASPART 100 UNIT/ML IJ SOLN
0.0000 [IU] | INTRAMUSCULAR | Status: DC | PRN
  Administered 2023-12-19: 4 [IU] via SUBCUTANEOUS
  Filled 2023-12-19: qty 1

## 2023-12-19 MED ORDER — SODIUM CHLORIDE 0.9 % IV SOLN: INTRAVENOUS | Status: DC

## 2023-12-19 MED ORDER — CHLORHEXIDINE GLUCONATE 0.12 % MT SOLN
15.0000 mL | Freq: Once | OROMUCOSAL | Status: AC
  Administered 2023-12-19: 15 mL via OROMUCOSAL
  Filled 2023-12-19: qty 15

## 2023-12-19 MED ORDER — HYALURONIDASE HUMAN 150 UNIT/ML IJ SOLN
INTRAMUSCULAR | Status: AC
Start: 2023-12-19 — End: 2023-12-19
  Filled 2023-12-19: qty 1

## 2023-12-19 MED ORDER — LIDOCAINE 2% (20 MG/ML) 5 ML SYRINGE
INTRAMUSCULAR | Status: DC | PRN
  Administered 2023-12-19: 60 mg via INTRAVENOUS

## 2023-12-19 MED ORDER — OFLOXACIN 0.3 % OP SOLN
1.0000 [drp] | OPHTHALMIC | Status: AC | PRN
  Administered 2023-12-19 (×3): 1 [drp] via OPHTHALMIC
  Filled 2023-12-19: qty 5

## 2023-12-19 MED ORDER — BSS PLUS IO SOLN
INTRAOCULAR | Status: AC
  Filled 2023-12-19: qty 500

## 2023-12-19 MED ORDER — DEXAMETHASONE SODIUM PHOSPHATE 10 MG/ML IJ SOLN
INTRAMUSCULAR | Status: AC
  Filled 2023-12-19: qty 1

## 2023-12-19 MED ORDER — ONDANSETRON HCL 4 MG/2ML IJ SOLN
INTRAMUSCULAR | Status: AC
  Filled 2023-12-19: qty 2

## 2023-12-19 MED ORDER — LIDOCAINE HCL 2 % IJ SOLN
INTRAMUSCULAR | Status: DC | PRN
  Administered 2023-12-19: .5 mL via RETROBULBAR

## 2023-12-19 MED ORDER — ORAL CARE MOUTH RINSE: 15.0000 mL | Freq: Once | OROMUCOSAL | Status: AC

## 2023-12-19 MED ORDER — DEXAMETHASONE SODIUM PHOSPHATE 10 MG/ML IJ SOLN
INTRAMUSCULAR | Status: DC | PRN
  Administered 2023-12-19: 10 mg

## 2023-12-19 MED ORDER — ATROPINE SULFATE 1 % OP SOLN
OPHTHALMIC | Status: AC
  Filled 2023-12-19: qty 5

## 2023-12-19 MED ORDER — TOBRAMYCIN-DEXAMETHASONE 0.3-0.1 % OP OINT
TOPICAL_OINTMENT | OPHTHALMIC | Status: DC | PRN
  Administered 2023-12-19: 1 via OPHTHALMIC

## 2023-12-19 MED ORDER — SUCCINYLCHOLINE CHLORIDE 200 MG/10ML IV SOSY
PREFILLED_SYRINGE | INTRAVENOUS | Status: DC | PRN
  Administered 2023-12-19: 100 mg via INTRAVENOUS

## 2023-12-19 MED ORDER — ONDANSETRON HCL 4 MG/2ML IJ SOLN
INTRAMUSCULAR | Status: DC | PRN
Start: 2023-12-19 — End: 2023-12-19
  Administered 2023-12-19: 4 mg via INTRAVENOUS

## 2023-12-19 MED ORDER — PROPOFOL 10 MG/ML IV BOLUS
INTRAVENOUS | Status: AC
Start: 2023-12-19 — End: 2023-12-19
  Filled 2023-12-19: qty 20

## 2023-12-19 MED ORDER — PHENYLEPHRINE 80 MCG/ML (10ML) SYRINGE FOR IV PUSH (FOR BLOOD PRESSURE SUPPORT)
PREFILLED_SYRINGE | INTRAVENOUS | Status: DC | PRN
Start: 2023-12-19 — End: 2023-12-19
  Administered 2023-12-19 (×2): 80 ug via INTRAVENOUS

## 2023-12-19 MED ORDER — PROPOFOL 10 MG/ML IV BOLUS
INTRAVENOUS | Status: AC
  Filled 2023-12-19: qty 20

## 2023-12-19 MED ORDER — DEXAMETHASONE SODIUM PHOSPHATE 10 MG/ML IJ SOLN
INTRAMUSCULAR | Status: DC | PRN
  Administered 2023-12-19: 5 mg via INTRAVENOUS

## 2023-12-19 MED ORDER — BUPIVACAINE HCL (PF) 0.75 % IJ SOLN
INTRAMUSCULAR | Status: AC
Start: 2023-12-19 — End: 2023-12-19
  Filled 2023-12-19: qty 10

## 2023-12-19 MED ORDER — CEFAZOLIN SUBCONJUNCTIVAL INJECTION 100 MG/0.5 ML
100.0000 mg | INJECTION | SUBCONJUNCTIVAL | Status: DC
  Filled 2023-12-19 (×2): qty 5

## 2023-12-19 MED ORDER — SODIUM CHLORIDE 0.9 % IV SOLN: INTRAVENOUS | Status: DC | PRN

## 2023-12-19 MED ORDER — PHENYLEPHRINE HCL 2.5 % OP SOLN
1.0000 [drp] | OPHTHALMIC | Status: AC | PRN
  Administered 2023-12-19 (×3): 1 [drp] via OPHTHALMIC
  Filled 2023-12-19: qty 2

## 2023-12-19 MED ORDER — SODIUM HYALURONATE 10 MG/ML IO SOLUTION
PREFILLED_SYRINGE | INTRAOCULAR | Status: AC
  Filled 2023-12-19: qty 0.85

## 2023-12-19 MED ORDER — TETRACAINE HCL 0.5 % OP SOLN
OPHTHALMIC | Status: AC
  Filled 2023-12-19: qty 4

## 2023-12-19 MED ORDER — PHENYLEPHRINE HCL-NACL 20-0.9 MG/250ML-% IV SOLN
INTRAVENOUS | Status: DC | PRN
  Administered 2023-12-19: 25 ug/min via INTRAVENOUS

## 2023-12-19 MED ORDER — CEFAZOLIN SUBCONJUNCTIVAL INJECTION 100 MG/0.5 ML
INJECTION | SUBCONJUNCTIVAL | Status: DC | PRN
  Administered 2023-12-19: 100 mg via SUBCONJUNCTIVAL

## 2023-12-19 MED ORDER — PROPARACAINE HCL 0.5 % OP SOLN
1.0000 [drp] | OPHTHALMIC | Status: AC | PRN
  Administered 2023-12-19 (×3): 1 [drp] via OPHTHALMIC
  Filled 2023-12-19: qty 15

## 2023-12-19 MED ORDER — TROPICAMIDE 1 % OP SOLN
1.0000 [drp] | OPHTHALMIC | Status: AC | PRN
  Administered 2023-12-19 (×3): 1 [drp] via OPHTHALMIC
  Filled 2023-12-19: qty 15

## 2023-12-19 MED ORDER — TOBRAMYCIN-DEXAMETHASONE 0.3-0.1 % OP OINT
TOPICAL_OINTMENT | OPHTHALMIC | Status: AC
  Filled 2023-12-19: qty 3.5

## 2023-12-19 MED ORDER — MIDAZOLAM HCL 2 MG/2ML IJ SOLN
INTRAMUSCULAR | Status: DC | PRN
Start: 2023-12-19 — End: 2023-12-19
  Administered 2023-12-19: 2 mg via INTRAVENOUS

## 2023-12-19 SURGICAL SUPPLY — 64 items
APPLICATOR COTTON TIP 6 STRL (MISCELLANEOUS) ×2 IMPLANT
BAND WRIST GAS GREEN (MISCELLANEOUS) IMPLANT
BLADE KERATOME 2.75 (BLADE) IMPLANT
BLADE MVR KNIFE 20G (BLADE) IMPLANT
BLADE STAB KNIFE 15DEG (BLADE) ×2 IMPLANT
BNDG EYE OVAL 2 1/8 X 2 5/8 (GAUZE/BANDAGES/DRESSINGS) IMPLANT
CABLE BIPOLOR RESECTION CORD (MISCELLANEOUS) ×2 IMPLANT
CANNULA ANT CHAM MAIN (OPHTHALMIC RELATED) IMPLANT
CANNULA DUAL BORE 23G (CANNULA) IMPLANT
CANNULA DUALBORE 25G (CANNULA) IMPLANT
CANNULA VLV SOFT TIP 25G (OPHTHALMIC) ×2 IMPLANT
CLSR STERI-STRIP ANTIMIC 1/2X4 (GAUZE/BANDAGES/DRESSINGS) ×2 IMPLANT
Cartridge IMPLANT
DRAPE HALF SHEET 40X57 (DRAPES) ×2 IMPLANT
DRAPE RETRACTOR (MISCELLANEOUS) ×2 IMPLANT
ERASER HMR WETFIELD 23G BP (MISCELLANEOUS) IMPLANT
FORCEPS ECKARDT ILM 25G SERR (OPHTHALMIC RELATED) IMPLANT
FORCEPS GRIESHABER ILM 25G A (INSTRUMENTS) IMPLANT
FORCEPS GRIESHABER MAX 25G (MISCELLANEOUS) ×4 IMPLANT
GAS AUTO FILL CONSTELLATION (OPHTHALMIC) IMPLANT
GLOVE SURG SYN 7.5 PF PI (GLOVE) ×2 IMPLANT
GOWN STRL REUS W/ TWL LRG LVL3 (GOWN DISPOSABLE) ×2 IMPLANT
KIT BASIN OR (CUSTOM PROCEDURE TRAY) ×2 IMPLANT
KIT TURNOVER KIT B (KITS) ×2 IMPLANT
KNIFE CRESCENT 2.5 55 ANG (BLADE) ×2 IMPLANT
LENS BIOM SUPER VIEW SET DISP (MISCELLANEOUS) ×2 IMPLANT
LENS DL 6X13X118.4X5.2 DP 23.5 IMPLANT
NDL 18GX1X1/2 (RX/OR ONLY) (NEEDLE) ×2 IMPLANT
NDL 25GX 5/8IN NON SAFETY (NEEDLE) ×2 IMPLANT
NDL 27GX1/2 REG BEVEL ECLIP (NEEDLE) ×2 IMPLANT
NDL FILTER BLUNT 18X1 1/2 (NEEDLE) ×2 IMPLANT
NDL HYPO 25GX1X1/2 BEV (NEEDLE) IMPLANT
NDL HYPO 30X.5 LL (NEEDLE) ×4 IMPLANT
NDL RETROBULBAR 25GX1.5 (NEEDLE) ×2 IMPLANT
NEEDLE 18GX1X1/2 (RX/OR ONLY) (NEEDLE) ×1 IMPLANT
NEEDLE 25GX 5/8IN NON SAFETY (NEEDLE) ×1 IMPLANT
NEEDLE 27GX1/2 REG BEVEL ECLIP (NEEDLE) ×1 IMPLANT
NEEDLE FILTER BLUNT 18X1 1/2 (NEEDLE) ×1 IMPLANT
NEEDLE HYPO 25GX1X1/2 BEV (NEEDLE) IMPLANT
NEEDLE HYPO 30X.5 LL (NEEDLE) ×2 IMPLANT
NEEDLE RETROBULBAR 25GX1.5 (NEEDLE) ×1 IMPLANT
PACK FRAGMATOME (OPHTHALMIC) IMPLANT
PACK PHACO CONSTELL (MISCELLANEOUS) IMPLANT
PACK VITRECTOMY CUSTOM (CUSTOM PROCEDURE TRAY) ×2 IMPLANT
PAD ARMBOARD POSITIONER FOAM (MISCELLANEOUS) ×4 IMPLANT
PAK PIK VITRECTOMY CVS 25GA (OPHTHALMIC) ×2 IMPLANT
PROBE ENDO DIATHERMY 25G (MISCELLANEOUS) IMPLANT
PROBE LASER ILLUM FLEX CVD 25G (OPHTHALMIC) IMPLANT
ROLLS DENTAL (MISCELLANEOUS) IMPLANT
SCRAPER DIAMOND 25GA (OPHTHALMIC RELATED) IMPLANT
SHIELD EYE LENSE ONLY DISP (GAUZE/BANDAGES/DRESSINGS) IMPLANT
SOL ANTI FOG 6CC (MISCELLANEOUS) ×2 IMPLANT
SOLN 0.9% NACL 1000 ML (IV SOLUTION) ×1 IMPLANT
SOLN 0.9% NACL POUR BTL 1000ML (IV SOLUTION) ×2 IMPLANT
SOLN STERILE WATER 1000 ML (IV SOLUTION) ×1 IMPLANT
SOLN STERILE WATER BTL 1000 ML (IV SOLUTION) ×2 IMPLANT
STOPCOCK 4 WAY LG BORE MALE ST (IV SETS) IMPLANT
SUT ETHILON 10 0 CS140 6 (SUTURE) IMPLANT
SYR 10ML LL (SYRINGE) IMPLANT
SYR 20ML LL LF (SYRINGE) ×2 IMPLANT
SYR 5ML LL (SYRINGE) ×2 IMPLANT
SYR TB 1ML LUER SLIP (SYRINGE) IMPLANT
TIP PHACO STRAIGHT 30DEG (OPHTHALMIC) IMPLANT
WIPE INSTRUMENT VISIWIPE 73X73 (MISCELLANEOUS) IMPLANT

## 2023-12-19 NOTE — Anesthesia Procedure Notes (Signed)
 Procedure Name: Intubation Date/Time: 12/19/2023 5:06 PM  Performed by: Arvell Edsel HERO, CRNAPre-anesthesia Checklist: Patient identified, Emergency Drugs available, Suction available, Patient being monitored and Timeout performed Oxygen  Delivery Method: Circle system utilized Preoxygenation: Pre-oxygenation with 100% oxygen  Induction Type: IV induction and Rapid sequence Laryngoscope Size: Glidescope and 3 Grade View: Grade I Tube size: 7.0 mm Number of attempts: 1 Airway Equipment and Method: Patient positioned with wedge pillow and Video-laryngoscopy Secured at: 22 cm Tube secured with: Tape

## 2023-12-19 NOTE — H&P (Signed)
 Date of examination:  12/19/2023  Indication for surgery: Aphakia right eye  Pertinent past medical history:  Past Medical History:  Diagnosis Date   Arthritis    Asthma    COPD (chronic obstructive pulmonary disease) (HCC)    DDD (degenerative disc disease), lumbar    Depression    Diabetes mellitus    Gastroparesis    GERD without esophagitis    High cholesterol    Hypertension    Hypothyroidism    PONV (postoperative nausea and vomiting)    PVD (peripheral vascular disease)    Wears glasses     Pertinent ocular history:  morgagnian cataract right eye  Pertinent family history:  Family History  Problem Relation Age of Onset   Stroke Mother    Hypertension Mother    Heart failure Father    Asthma Father    Diabetes Father    Heart disease Father    Emphysema Paternal Grandfather    Colon cancer Neg Hx    Colon polyps Neg Hx     General:  Healthy appearing patient in no distress.     Eyes:    Acuity OD 20/400   External: Within normal limits      Anterior segment: Within normal limits        Fundus: MA's, attenuated vessels      Impression: Aphakia right eye  Plan:  Secondary implant of intraocular lens right eye  Onesimo Blanch, MD

## 2023-12-19 NOTE — Discharge Instructions (Addendum)
 Keep patch in place.  Sleep with head elevated 30 degrees.

## 2023-12-19 NOTE — Anesthesia Preprocedure Evaluation (Addendum)
 Anesthesia Evaluation  Patient identified by MRN, date of birth, ID band Patient awake    Reviewed: Allergy & Precautions, NPO status , Patient's Chart, lab work & pertinent test results, reviewed documented beta blocker date and time   History of Anesthesia Complications (+) PONVNegative for: history of anesthetic complications  Airway Mallampati: II  TM Distance: >3 FB Neck ROM: Full    Dental  (+) Poor Dentition, Dental Advisory Given, Missing   Pulmonary COPD, former smoker   breath sounds clear to auscultation       Cardiovascular hypertension, + Peripheral Vascular Disease   Rhythm:Regular Rate:Normal  TTE (2023): 1. Left ventricular ejection fraction, by estimation, is 65 to 70%. Left  ventricular ejection fraction by 2D MOD biplane is 68.5 %. The left  ventricle has normal function. The left ventricle has no regional wall  motion abnormalities. Left ventricular  diastolic parameters are consistent with Grade I diastolic dysfunction  (impaired relaxation).   2. Right ventricular systolic function is normal. The right ventricular  size is normal. Tricuspid regurgitation signal is inadequate for assessing  PA pressure.   3. The mitral valve is grossly normal. No evidence of mitral valve  regurgitation.   4. The aortic valve is tricuspid. Aortic valve regurgitation is not  visualized.   5. The inferior vena cava is normal in size with greater than 50%  respiratory variability, suggesting right atrial pressure of 3 mmHg.      Neuro/Psych  PSYCHIATRIC DISORDERS  Depression       GI/Hepatic ,GERD  Medicated,,  Endo/Other  diabetes, Type 2Hypothyroidism    Renal/GU Renal disease     Musculoskeletal  (+) Arthritis ,    Abdominal   Peds  Hematology   Anesthesia Other Findings   Reproductive/Obstetrics                              Anesthesia Physical Anesthesia Plan  ASA:  2  Anesthesia Plan: General   Post-op Pain Management:    Induction: Intravenous and Rapid sequence  PONV Risk Score and Plan: 2 and Treatment may vary due to age or medical condition and Ondansetron   Airway Management Planned: Oral ETT  Additional Equipment:   Intra-op Plan:   Post-operative Plan: Extubation in OR  Informed Consent:      Dental advisory given  Plan Discussed with: CRNA  Anesthesia Plan Comments: (Last dose of Trulicity  9/30 night with history of gastroparesis. Due to her history, plan for RSI and GETA. Patient understands and is agreeable to plan.)         Anesthesia Quick Evaluation

## 2023-12-19 NOTE — Transfer of Care (Signed)
 Immediate Anesthesia Transfer of Care Note  Patient: Alexandria Webster  Procedure(s) Performed: VITRECTOMY, PARS PLANA APPROACH, WITH IOL INSERTION OR REPLACEMENT (Right)  Patient Location: PACU  Anesthesia Type:General  Level of Consciousness: drowsy and patient cooperative  Airway & Oxygen  Therapy: Patient Spontanous Breathing and Patient connected to face mask oxygen   Post-op Assessment: Report given to RN, Post -op Vital signs reviewed and stable, Patient moving all extremities, and Patient moving all extremities X 4  Post vital signs: Reviewed and stable  Last Vitals:  Vitals Value Taken Time  BP 110/45 12/19/23 18:00  Temp    Pulse 66 12/19/23 18:02  Resp 16 12/19/23 18:02  SpO2 100 % 12/19/23 18:02  Vitals shown include unfiled device data.  Last Pain:  Vitals:   12/19/23 1800  TempSrc:   PainSc: Asleep         Complications: No notable events documented.

## 2023-12-19 NOTE — Brief Op Note (Signed)
 12/19/2023  5:55 PM  PATIENT:  Erminio LITTIE Laster  58 y.o. female  PRE-OPERATIVE DIAGNOSIS:  Aphakia of right eye  POST-OPERATIVE DIAGNOSIS:  Aphakia of right eye  PROCEDURE:  Secondary implant of intraocular lens right eye  SURGEON:  Surgeons and Role:    * Tobie Baptist, MD - Primary  PHYSICIAN ASSISTANT:   ASSISTANTS: none   ANESTHESIA:  local and general  EBL:  minimal   BLOOD ADMINISTERED:none  DRAINS: none   LOCAL MEDICATIONS USED:  MARCAINE     and LIDOCAINE    SPECIMEN:  No Specimen  DISPOSITION OF SPECIMEN:  N/A  COUNTS:  YES  TOURNIQUET:  * No tourniquets in log *  DICTATION: .Note written in EPIC  PLAN OF CARE: Discharge to home after PACU  PATIENT DISPOSITION:  PACU - hemodynamically stable.   Delay start of Pharmacological VTE agent (>24hrs) due to surgical blood loss or risk of bleeding: not applicable

## 2023-12-19 NOTE — Op Note (Incomplete)
 SHIREL MALLIS 12/19/2023 Diagnosis: Aphakia right eye  Procedure: Secondary implant of intraocular lens Operative Eye:  right eye  Surgeon: Onesimo Brendolyn Blanch Estimated Blood Loss: minimal Specimens for Pathology:  None Complications: none  General anesthesia was attained.  Time out confirmed the correct operative eye as the right eye.  The  patient was prepped and draped in the usual fashion for ocular surgery on the  right eye .  A lid speculum was placed.   A keratome was used to make a superior clear corneal wound.  Viscoelastic was placed in the eye.  A 23.5 diopter 3 piece IOL was placed in an inserter the lens was folded and advanced into the cartridge. The 3 piece IOL was further advanced in the cartridge and the inserter placed through the keratotomy.  The IOL was carefully placed in the sulcus.  The lens was carefully rotated in place and the IOL centered.  Viscoelastic was irrigated from the anterior chamber.  The corneal wound was hydrated and noted to be sealed.  Subconjunctival injections of Ancef  and Decadron  were placed along with local anesthetic.  The speculum and drapes were removed and the eye was patched with Polymixin/Bacitracin  ophthalmic ointment. An eye shield was placed and the patient was revived from general anesthesia and transferred to the PACU in stable condition.  The patient tolerated the procedure well and no complications were noted.  Onesimo Brendolyn Blanch MD

## 2023-12-20 NOTE — Anesthesia Postprocedure Evaluation (Signed)
 Anesthesia Post Note  Patient: Alexandria Webster  Procedure(s) Performed: SECONDARY IMPLANT OF INTRAOCULAR LENS (Right: Eye)     Patient location during evaluation: PACU Anesthesia Type: General Level of consciousness: awake and alert Pain management: pain level controlled Vital Signs Assessment: post-procedure vital signs reviewed and stable Respiratory status: spontaneous breathing, nonlabored ventilation and respiratory function stable Cardiovascular status: stable and blood pressure returned to baseline Anesthetic complications: no   No notable events documented.  Last Vitals:  Vitals:   12/19/23 1815 12/19/23 1830  BP: (!) 106/51 (!) 117/54  Pulse: 72 67  Resp: 14 11  Temp:  36.6 C  SpO2: 96% 94%    Last Pain:  Vitals:   12/19/23 1800  TempSrc:   PainSc: Asleep                 Debby FORBES Like

## 2023-12-24 DIAGNOSIS — Z419 Encounter for procedure for purposes other than remedying health state, unspecified: Secondary | ICD-10-CM | POA: Diagnosis not present

## 2023-12-28 ENCOUNTER — Encounter (HOSPITAL_COMMUNITY): Payer: Self-pay | Admitting: Ophthalmology

## 2023-12-28 ENCOUNTER — Telehealth: Payer: Self-pay | Admitting: Physical Medicine and Rehabilitation

## 2023-12-28 NOTE — Telephone Encounter (Signed)
 Patient needs refill on PARoxetine  (PAXIL ) 20 MG tablet

## 2023-12-29 MED ORDER — PAROXETINE HCL 20 MG PO TABS: 20.0000 mg | ORAL_TABLET | Freq: Every day | ORAL | 1 refills | Status: AC

## 2024-01-01 ENCOUNTER — Other Ambulatory Visit: Payer: Self-pay | Admitting: Gastroenterology

## 2024-01-01 NOTE — Telephone Encounter (Signed)
 Only 1 month supply given. Multiple no shows or cancelled appointments since last OV September 2024. Has appointment scheduled 01/23/24.

## 2024-01-15 ENCOUNTER — Encounter (HOSPITAL_COMMUNITY): Payer: Self-pay | Admitting: Ophthalmology

## 2024-01-16 DIAGNOSIS — E113512 Type 2 diabetes mellitus with proliferative diabetic retinopathy with macular edema, left eye: Secondary | ICD-10-CM | POA: Diagnosis not present

## 2024-01-23 ENCOUNTER — Ambulatory Visit (INDEPENDENT_AMBULATORY_CARE_PROVIDER_SITE_OTHER): Admitting: Gastroenterology

## 2024-01-23 ENCOUNTER — Encounter: Payer: Self-pay | Admitting: Gastroenterology

## 2024-01-23 VITALS — BP 137/83 | HR 71 | Temp 98.0°F | Ht 64.0 in | Wt 114.0 lb

## 2024-01-23 DIAGNOSIS — R112 Nausea with vomiting, unspecified: Secondary | ICD-10-CM

## 2024-01-23 DIAGNOSIS — K219 Gastro-esophageal reflux disease without esophagitis: Secondary | ICD-10-CM

## 2024-01-23 DIAGNOSIS — K581 Irritable bowel syndrome with constipation: Secondary | ICD-10-CM | POA: Diagnosis not present

## 2024-01-23 MED ORDER — LINACLOTIDE 290 MCG PO CAPS: 290.0000 ug | ORAL_CAPSULE | Freq: Every day | ORAL | 0 refills | Status: DC

## 2024-01-23 MED ORDER — ESOMEPRAZOLE MAGNESIUM 40 MG PO CPDR: 40.0000 mg | DELAYED_RELEASE_CAPSULE | Freq: Two times a day (BID) | ORAL | 3 refills | Status: AC

## 2024-01-23 MED ORDER — ESOMEPRAZOLE MAGNESIUM 40 MG PO CPDR: 40.0000 mg | DELAYED_RELEASE_CAPSULE | Freq: Two times a day (BID) | ORAL | 5 refills | Status: DC

## 2024-01-23 MED ORDER — ONDANSETRON 4 MG PO TBDP: 4.0000 mg | ORAL_TABLET | Freq: Three times a day (TID) | ORAL | 1 refills | Status: AC | PRN

## 2024-01-23 MED ORDER — LINACLOTIDE 290 MCG PO CAPS: 290.0000 ug | ORAL_CAPSULE | Freq: Every day | ORAL | 3 refills | Status: AC

## 2024-01-23 NOTE — Patient Instructions (Addendum)
 I would recommend you start increasing your fiber intake as well to help with constipation and colon health.      If you take the powder you should take 1 tablespoon once daily in at least 8 oz of water .  Capsules you will take 2-3 daily.  Please drink at least 6 to 8 ounces of water  with it.  Continue taking your Linzess  29 mcg daily and MiraLAX  once daily.  If you notice less frequency of bowel movements then increase your MiraLAX  to twice daily.  I have refilled your Nexium  for you, please continue taking twice a day and provided you some Zofran  to use as needed for severe nausea and vomiting.  We will reach out to you next year to discuss getting scheduled for DEXA scan given chronic use of Nexium  and given you will be past age of 53 at that time.  It was a pleasure to see you today. I want to create trusting relationships with patients. If you receive a survey regarding your visit,  I greatly appreciate you taking time to fill this out on paper or through your MyChart. I value your feedback.  Charmaine Melia, MSN, FNP-BC, AGACNP-BC [facility]

## 2024-01-23 NOTE — Op Note (Signed)
 Alexandria Webster 01/23/2024 Diagnosis: Aphakia right eye  Procedure: Secondary implantation of intraocular lens right eye Operative Eye:  right eye  Surgeon: Onesimo Brendolyn Blanch Estimated Blood Loss: minimal Specimens for Pathology:  None Complications: none   The  patient was prepped and draped in the usual fashion for ocular surgery on the  right eye .  A lid speculum was placed.  A supersharp 15 degree blade was used to make a clear corneal wound and viscoelastic was placed in the eye and the sulcus better visualized.  A 3.71mm superior clear corneal wound was made at the near liimbus.  A 3 piece intraocular lens was placed in the eye with an inserter and dialed into the sulcus.  Viscoelastic was removed from the eye.  The corneal wounds were hydrated and noted to be sealed.  The speculum and drapes were removed and the eye was patched with Polymixin/Bacitracin  ophthalmic ointment. An eye shield was placed and the patient was transferred alert and conversant with stable vital signs to the post operative recovery area.  The patient tolerated the procedure well and no complications were noted.  Onesimo Brendolyn Blanch MD

## 2024-01-23 NOTE — Progress Notes (Signed)
 GI Office Note    Referring Provider: Marvine Rush, MD Primary Care Physician:  Marvine Rush, MD Primary Gastroenterologist: Carlin POUR. Cindie, DO  Date:  01/23/2024  ID:  Erminio LITTIE Laster, DOB 1965-11-26, MRN [id number]   Chief Complaint   Chief Complaint  Patient presents with   Follow-up    Follow up. No problems    History of Present Illness  Alexandria Webster is a 58 y.o. female with a history of asthma, constipation, COPD, diabetes, gastroparesis, GERD, HTN, hypothyroidism, PVD, and multiple eye surgeries presenting today for follow-up with no complaints.  EGD 10/14/21: - small hiatal hernia - normal esophagus - gastritis s/p biopsy (normal mucosa) - normal duodenum - Advised PPI daily.    Colonoscopy 10/14/21: - Normal - Repeat in 10 years   OV 01/20/2022.  Improved GERD with Nexium  twice daily.  Nausea vomiting significantly improved since starting Nexium  twice daily.  Did report dairy does bother her.  Still having appetite and weight loss but this is likely secondary to Trulicity .  Constipation fairly well-controlled with Colace daily and MiraLAX  as needed.  Denied any straining.  Bowel movement at least every day if not twice a day.  Nexium  prescription updated, encouraged high-protein diet and protein supplementation and to monitor weight.  Follow-up in 6 months.   OV 02/09/22.  2 episodes of vomiting occurring in the evening.  Happens randomly without nausea.  Always occurring after she eats.  It admits to eating fried/greasy foods rarely.  Noted to be on Trulicity .  Have reported 9-10 pound weight loss since prior visit.  Taking Colace daily and if she takes MiraLAX  she does all bit more and was going on Rehman for the most part.  Denies any abdominal pain, melena, or BRBPR.  Advise continue Nexium  twice daily.  Continue MiraLAX  and Colace.  Advised protein supplements and avoid lactose.   OV 10/10/22. Taking MiraLAX  and Colace once daily.  Occasionally having take  mineral oil to have a bowel movement.  She thinks MiraLAX  complex her stools does not feel he is working well.  States sometimes she goes 2-3 weeks without a bowel movement, with mineral oil goes 2-3 days/week.  Still straining at times.  Mild toilet tissue hematochezia.  Mild nausea/vomiting with constipation, no overt abdominal pain.  GERD well-controlled if she takes Nexium  twice daily, recently having issues with medication getting refilled.  Mild pill dysphagia but not frequently.  Decreased appetite likely secondary to Trulicity . Nexium  BID, stool softener 1-2 times daily. Increase water  intake. Trial Linzess  145 mcg daily. High protein diet.  Advised to trial Linzess  145 mcg daily, continue Nexium  twice daily, continue stool softener daily and possibly consider increasing to twice daily.   On 9/26 patient reported 145 was not working for her as she had not had a bowel something additional.  Recommended for her to increase her Linzess  to 290 mcg daily and start MiraLAX  daily.  Last office visit 12/12/2022.  Initially had been doing well with Linzess  145 mcg however she started taking 2/day about a week prior and to the dose of MiraLAX  and following had a bowel movement afterwards then still had some trouble so she took some mineral oil.  Having bloating and cramping.  GERD well-controlled with Nexium .  Nausea and vomiting is under control and still taking her Trulicity  just a smaller portions.  I have asked her to increase her calories by 500 a day to help gain weight.  Increase Linzess  to 290 mcg daily and  advised MiraLAX  once daily as well if needed.  Use Dulcolax suppository for refractory constipation.  Continue Nexium  40 mg twice daily and advised to increase her water  and fiber intake.  Advised we could try Movantik in the future if Linzess  290 was not effective.  Today:  Discussed the use of AI scribe software for clinical note transcription with the patient, who gave verbal consent to  proceed.  Her constipation is well-managed with Linzess  290 mcg once daily and Miralax  17g once daily. She experiences bowel movements sometimes twice a day or once every other day, which is an improvement from her previous condition. Denies overt diarrhea, melena, or brbpr.  She experiences occasional nausea, primarily in the mornings, which she attributes to phlegm congestion rather than food intake. This nausea is less frequent than before. She is not a breakfast person and sometimes combines breakfast and lunch, which occasionally leads to discomfort.  Her appetite is described as 'so-so', and she has gained ten pounds since her last visit. She is still on Trulicity , which may contribute to her feeling full quickly, she has some known diabetic gastroparesis.  She had neck surgery last year and reports that her recovery is going well. She is no longer on pain medication.  She reports that she finds it challenging to increase her fiber intake due to the filling nature of fiber-rich foods, especially while on Trulicity . No abdominal pain or vomiting.      Wt Readings from Last 6 Encounters:  01/23/24 114 lb (51.7 kg)  12/19/23 114 lb (51.7 kg)  10/24/23 104 lb (47.2 kg)  10/06/23 104 lb (47.2 kg)  09/12/23 105 lb (47.6 kg)  04/13/23 104 lb (47.2 kg)    Body mass index is 19.57 kg/m.   Current Outpatient Medications  Medication Sig Dispense Refill   albuterol  (PROVENTIL ) 2 MG tablet Take 1 tablet (2 mg total) by mouth daily. 30 tablet 0   albuterol  (VENTOLIN  HFA) 108 (90 Base) MCG/ACT inhaler Inhale 1-2 puffs into the lungs every 4 (four) hours as needed for wheezing or shortness of breath. 8 g 0   aspirin  325 MG tablet Take 325 mg by mouth in the morning.     cetirizine (ZYRTEC) 10 MG tablet Take 10 mg by mouth daily.     docusate sodium  (COLACE) 100 MG capsule Take 200 mg by mouth every morning.     Dulaglutide  (TRULICITY ) 1.5 MG/0.5ML SOAJ Inject 1.5 mg into the skin once a week.  6 mL 3   esomeprazole  (NEXIUM ) 40 MG capsule TAKE ONE CAPSULE (40MG  TOTAL) BY MOUTH TWO TIMES DAILY BEFORE A MEAL 60 capsule 5   hydrOXYzine  (ATARAX ) 25 MG tablet Take 1 tablet (25 mg total) by mouth 3 (three) times daily as needed for anxiety (TID prn for anxiety and then  can take up to 2 pills/nightly for sleep). 450 tablet 1   ipratropium (ATROVENT ) 0.03 % nasal spray Place 3 sprays into both nostrils 3 (three) times daily as needed for rhinitis.     levothyroxine  (SYNTHROID ) 50 MCG tablet Take 1 tablet (50 mcg total) by mouth daily before breakfast. 90 tablet 3   linaclotide  (LINZESS ) 290 MCG CAPS capsule TAKE ONE CAPSULE ( TOTAL) BY MOUTHDAILY BEFORE BREAKFAST 30 capsule 0   meclizine  (ANTIVERT ) 25 MG tablet Take 1 tablet (25 mg total) by mouth 3 (three) times daily as needed for dizziness. 30 tablet 0   midodrine  (PROAMATINE ) 10 MG tablet TAKE ONE TABLET BY MOUTH THREE TIMES DAILY 270 tablet  1   PARoxetine  (PAXIL ) 20 MG tablet Take 1-2 tablets (20-40 mg total) by mouth daily. Can also take at night if need be. 180 tablet 1   polyethylene glycol (MIRALAX  / GLYCOLAX ) 17 g packet Take 17 g by mouth daily.     timolol (TIMOPTIC) 0.25 % ophthalmic solution Place 1 drop into the right eye 2 (two) times daily.     lovastatin  (MEVACOR ) 20 MG tablet Take 1 tablet (20 mg total) by mouth at bedtime. (Patient taking differently: Take 20 mg by mouth in the morning.) 30 tablet 0   traZODone  (DESYREL ) 150 MG tablet Take 1 tablet (150 mg total) by mouth at bedtime. (Patient not taking: Reported on 01/23/2024) 30 tablet 5   No current facility-administered medications for this visit.    Past Medical History:  Diagnosis Date   Arthritis    Asthma    COPD (chronic obstructive pulmonary disease) (HCC)    DDD (degenerative disc disease), lumbar    Depression    Diabetes mellitus    Gastroparesis    GERD without esophagitis    High cholesterol    Hypertension    Hypothyroidism    PONV  (postoperative nausea and vomiting)    PVD (peripheral vascular disease)    Wears glasses     Past Surgical History:  Procedure Laterality Date   AIR/FLUID EXCHANGE Right 11/22/2022   Procedure: AIR/FLUID EXCHANGE;  Surgeon: Tobie Baptist, MD;  Location: [facility] OR;  Service: Ophthalmology;  Laterality: Right;   AORTA - BILATERAL FEMORAL ARTERY BYPASS GRAFT N/A 09/27/2018   Procedure: Redo Exposure  AORTOBIFEMORAL Bypass and bilateral femoral Artery ; Redo Aortobifemoral  BYPASS GRAFT.;  Surgeon: Gretta Lonni PARAS, MD;  Location: [facility] OR;  Service: Vascular;  Laterality: N/A;   BACK SURGERY     BIOPSY  10/14/2021   Procedure: BIOPSY;  Surgeon: Cindie Carlin POUR, DO;  Location: [facility]ITE;  Service: Endoscopy;;   CATARACT EXTRACTION EXTRACAPSULAR Left 04/13/2023   Procedure: CATARACT EXTRACTION EXTRACAPSULAR WITH INTRAOCULAR LENS PLACEMENT (IOC);  Surgeon: Tobie Baptist, MD;  Location: [facility] OR;  Service: Ophthalmology;  Laterality: Left;  MAC WITH BLOCK   COLONOSCOPY WITH PROPOFOL  N/A 10/14/2021   Procedure: COLONOSCOPY WITH PROPOFOL ;  Surgeon: Cindie Carlin POUR, DO;  Location: [facility];  Service: Endoscopy;  Laterality: N/A;  8:30am   ESOPHAGOGASTRODUODENOSCOPY (EGD) WITH PROPOFOL  N/A 10/14/2021   Procedure: ESOPHAGOGASTRODUODENOSCOPY (EGD) WITH PROPOFOL ;  Surgeon: Cindie Carlin POUR, DO;  Location: [facility];  Service: Endoscopy;  Laterality: N/A;   FEMORAL ARTERY STENT  right leg   INJECTION OF SILICONE OIL Left 09/06/2022   Procedure: INJECTION OF SILICONE OIL;  Surgeon: Tobie Baptist, MD;  Location: [facility] OR;  Service: Ophthalmology;  Laterality: Left;   INJECTION OF SILICONE OIL Right 11/22/2022   Procedure: INJECTION OF SILICONE OIL;  Surgeon: Tobie Baptist, MD;  Location: [facility] OR;  Service: Ophthalmology;  Laterality: Right;   MEMBRANE PEEL Left 09/06/2022   Procedure: MEMBRANECTOMY;  Surgeon: Tobie Baptist, MD;  Location: [facility] OR;  Service: Ophthalmology;  Laterality: Left;    PARS PLANA VITRECTOMY Left 09/06/2022   Procedure: PARS PLANA VITRECTOMY WITH 25 GAUGE;  Surgeon: Tobie Baptist, MD;  Location: [facility] OR;  Service: Ophthalmology;  Laterality: Left;   PARS PLANA VITRECTOMY Left 02/14/2023   Procedure: PARS PLANA VITRECTOMY WITH 25 GAUGE;  Surgeon: Tobie Baptist, MD;  Location: [facility] OR;  Service: Ophthalmology;  Laterality: Left;   PARS PLANA VITRECTOMY Right 12/19/2023   Procedure: SECONDARY IMPLANT OF  INTRAOCULAR LENS;  Surgeon: Tobie Baptist, MD;  Location: [facility] OR;  Service: Ophthalmology;  Laterality: Right;   PARS PLANA VITRECTOMY W/ ENDOLASER PANRETINAL PHOTOCOAGULATION Right 09/12/2023   Procedure: PARS PLANA VITRECTOMY WITH 25 GAUGE WITH ENDOLASER PANRETINAL PHOTOCOAGULATION;  Surgeon: Tobie Baptist, MD;  Location: [facility] OR;  Service: Ophthalmology;  Laterality: Right;  MAC WITH BLOCK   PHOTOCOAGULATION WITH LASER Left 09/06/2022   Procedure: PHOTOCOAGULATION WITH LASER;  Surgeon: Tobie Baptist, MD;  Location: [facility] OR;  Service: Ophthalmology;  Laterality: Left;   PHOTOCOAGULATION WITH LASER Right 11/22/2022   Procedure: PHOTOCOAGULATION WITH LASER;  Surgeon: Tobie Baptist, MD;  Location: [facility] OR;  Service: Ophthalmology;  Laterality: Right;   PHOTOCOAGULATION WITH LASER Left 02/14/2023   Procedure: PHOTOCOAGULATION WITH LASER;  Surgeon: Tobie Baptist, MD;  Location: [facility] OR;  Service: Ophthalmology;  Laterality: Left;   POSTERIOR CERVICAL FUSION/FORAMINOTOMY N/A 07/19/2022   Procedure: Posterior cervical fusion with lateral mass fixation - Cervical four-cervical five, Cervical five-Cervical six - Cervical six-Cervical seven - Cervical seven-Thoracic one with laminectomy;  Surgeon: Louis Shove, MD;  Location: [facility] OR;  Service: Neurosurgery;  Laterality: N/A;   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   Procedure: REPAIR OF COMPLEX TRACTION RETINAL DETACHMENT;  Surgeon: Tobie Baptist, MD;  Location: [facility] OR;  Service: Ophthalmology;  Laterality: Left;   MAC WITH BLOCK   REPAIR OF COMPLEX TRACTION RETINAL DETACHMENT Right 11/22/2022   Procedure: REPAIR OF COMPLEX TRACTION RETINAL DETACHMENT;  Surgeon: Tobie Baptist, MD;  Location: [facility] OR;  Service: Ophthalmology;  Laterality: Right;  MAC WITH BLOCK   SILICON OIL REMOVAL Left 02/14/2023   Procedure: SILICON OIL REMOVAL;  Surgeon: Tobie Baptist, MD;  Location: [facility] OR;  Service: Ophthalmology;  Laterality: Left;   TUBAL LIGATION     VITRECTOMY AND CATARACT Right 10/24/2023   Procedure: EXTRACTION, CATARACT, WITH VITRECTOMY;  Surgeon: Tobie Baptist, MD;  Location: [facility] OR;  Service: Ophthalmology;  Laterality: Right;  BLOCK    Family History  Problem Relation Age of Onset   Stroke Mother    Hypertension Mother    Heart failure Father    Asthma Father    Diabetes Father    Heart disease Father    Emphysema Paternal Grandfather    Colon cancer Neg Hx    Colon polyps Neg Hx     Allergies as of 01/23/2024 - Review Complete 01/23/2024  Allergen Reaction Noted   Metformin  and related Nausea And Vomiting 07/14/2022    Social History   Socioeconomic History   Marital status: Single    Spouse name: Not on file   Number of children: 2   Years of education: Not on file   Highest education level: Not on file  Occupational History   Not on file  Tobacco Use   Smoking status: Former    Current packs/day: 0.00    Types: Cigarettes    Quit date: 03/14/1982    Years since quitting: 41.8    Passive exposure: Past   Smokeless tobacco: Current    Types: Snuff  Vaping Use   Vaping status: Never Used  Substance and Sexual Activity   Alcohol use: No   Drug use: No   Sexual activity: Yes    Birth control/protection: Surgical    Comment: tubal  Other Topics Concern   Not on file  Social History Narrative   Not on file   Social Drivers of Health   Financial Resource Strain: Not on file  Food Insecurity: Not on file  Transportation Needs: Not on file  Physical Activity: Not on file   Stress: Not on file  Social Connections: Not on file    Review of Systems   Gen: Denies fever, chills, anorexia. Denies fatigue, weakness, weight loss.  CV: Denies chest pain, palpitations, syncope, peripheral edema, and claudication. Resp: Denies dyspnea at rest, cough, wheezing, coughing up blood, and pleurisy. GI: See HPI Derm: Denies rash, itching, dry skin Psych: Denies depression, anxiety, memory loss, confusion. No homicidal or suicidal ideation.  Heme: Denies bruising, bleeding, and enlarged lymph nodes.  Physical Exam   BP 137/83 (BP Location: Left Arm, Patient Position: Sitting, Cuff Size: Normal)   Pulse 71   Temp 98 F (36.7 C) (Temporal)   Ht 5' 4 (1.626 m)   Wt 114 lb (51.7 kg)   LMP 01/21/2012   BMI 19.57 kg/m   General:   Alert and oriented. No distress noted. Pleasant and cooperative. Frail appearing.  Head:  Normocephalic and atraumatic. Eyes:  Conjuctiva clear without scleral icterus. Mouth:  Oral mucosa pink and moist. Good dentition. No lesions. Lungs:  Clear to auscultation bilaterally. No wheezes, rales, or rhonchi. No distress.  Heart:  S1, S2 present without murmurs appreciated.  Abdomen:  +BS, soft, non-tender and non-distended. No rebound or guarding. No HSM or masses noted. Rectal: deferred Msk:  UTA - sitting in wheelchair.  Neurologic:  Alert and  oriented x4 Psych:  Alert and cooperative. Normal mood and affect.  Assessment & Plan  MODEAN MCCULLUM is a 58 y.o. female presenting today with no complaints but does need medication refill.    Irritable bowel syndrome with constipation Constipation is well-managed with Linzess  and Miralax . Bowel movements occur once to twice daily, occasionally skipping a day. No abdominal pain reported. Fiber intake needs improvement. - Continue Linzess  290 mcg daily and Miralax  17g once daily as currently prescribed. - Increase Miralax  17g to twice daily if needed. - Recommended fiber supplementation with  Metamucil in powder or capsule form. Provided options for her on AVS.   Gastroesophageal reflux disease GERD symptoms are managed with Nexium . No significant issues reported. - Refilled Nexium  40 mg BID prescription. - Advised bone density scan needed next year given age and chronic PPI use to assess for osteoporosis.   Nausea and vomiting Intermittent nausea and occasional vomiting, primarily in the morning, likely related to phlegm and gag reflex rather than food intake. Symptoms have improved since last visit. She does hve known gastroparesis which currently is well controlled.  - Continue current management and monitor symptoms. - Provided zofran  4mg  every 8 hours as needed - short supply to use in severe refractory cases.     Follow up   Follow up 1 year.     Charmaine Melia, MSN, FNP-BC, AGACNP-BC [facility]

## 2024-01-24 DIAGNOSIS — Z419 Encounter for procedure for purposes other than remedying health state, unspecified: Secondary | ICD-10-CM | POA: Diagnosis not present

## 2024-01-25 ENCOUNTER — Encounter (HOSPITAL_COMMUNITY): Payer: Self-pay | Admitting: Ophthalmology

## 2024-01-26 ENCOUNTER — Encounter: Attending: Physical Medicine and Rehabilitation | Admitting: Physical Medicine and Rehabilitation

## 2024-01-26 ENCOUNTER — Encounter: Payer: Self-pay | Admitting: Physical Medicine and Rehabilitation

## 2024-01-26 VITALS — BP 142/94 | HR 91 | Ht 64.0 in | Wt 114.0 lb

## 2024-01-26 DIAGNOSIS — M25362 Other instability, left knee: Secondary | ICD-10-CM | POA: Diagnosis not present

## 2024-01-26 DIAGNOSIS — R2689 Other abnormalities of gait and mobility: Secondary | ICD-10-CM | POA: Insufficient documentation

## 2024-01-26 DIAGNOSIS — F3341 Major depressive disorder, recurrent, in partial remission: Secondary | ICD-10-CM | POA: Diagnosis not present

## 2024-01-26 DIAGNOSIS — G8254 Quadriplegia, C5-C7 incomplete: Secondary | ICD-10-CM | POA: Insufficient documentation

## 2024-01-26 DIAGNOSIS — R252 Cramp and spasm: Secondary | ICD-10-CM | POA: Insufficient documentation

## 2024-01-26 DIAGNOSIS — G959 Disease of spinal cord, unspecified: Secondary | ICD-10-CM | POA: Diagnosis not present

## 2024-01-26 MED ORDER — HYDROXYZINE HCL 25 MG PO TABS: 25.0000 mg | ORAL_TABLET | Freq: Three times a day (TID) | ORAL | 1 refills | Status: DC | PRN

## 2024-01-26 MED ORDER — BACLOFEN 10 MG PO TABS: 5.0000 mg | ORAL_TABLET | Freq: Three times a day (TID) | ORAL | 0 refills | Status: DC

## 2024-01-26 MED ORDER — ALBUTEROL SULFATE 2 MG PO TABS: 2.0000 mg | ORAL_TABLET | Freq: Every day | ORAL | 1 refills | Status: DC

## 2024-01-26 NOTE — Patient Instructions (Addendum)
 Pt is a 58 yr old female with hx of cervical myelopathy s/p C5-T1 decompression lamintomy and PCDF 07/19/22 by Dr Louis.  She also has hx of post op pain; COPD, hx of HTN with orthostatic hypotension; DM; Hypothyroidism, and acute on chronic constipation;  Here for  f/u on cervical myelopathy.      Will send in Albuterol  2 mg daily pills for pt until sees new PCP.   2.  Do not drink coffee of soda no later than noon.   So you can sleep at night, this can make a big difference. Chronic insomnia.   3.  Con't Midodrine  - BP is 142/94 today- will try to decrease Midodrine  to  5 mg 3x/day (1/2 tab)- but if symptoms come back, then go back to 10 mg 3x/day.  -doesn't need refill today- last filled 11/03/23  4. Never got into therapy- so will rewrite for outpt PT  Pt with cervical myelopathy- needs additional therapy for gait, balance and strengthening- hx of multiple eye surgeries with poor vision.   5. Will get Xray of L knee due to buckling frequently- cannot walk >15-20 ft max, so this is needed to make sure we can address her gait.   6.  Actually hasn't been taking baclofen- didn't have Rx- - so will restart 5 mg 3x/day x 1 week, then 10 mg/1 tab 3x/day.  The first dose of baclofen for first hour, be with someone.  Just to make sure- if has throat closing up , go to ER immediately or call 911.  -spasticity is due to compression in your neck- and due to prior compression- so spasticity tells not getting al the signals to the muscles from the brain- and can increase til 2 years post surgery- however, will plateau at that point  Can get worse with an infection- barometer- to tell you if you are getting an infection. Spasticity means tightness and spasms.    7.  Sent in refill of Hydroxyzine  25 mg- to help with anxiety and sleep.    8. Con't Paxil - will con't 20 mg daily- last refilled 12/29/23   9. F/U in 3months double appt. SCI

## 2024-01-26 NOTE — Progress Notes (Signed)
 Subjective:    Patient ID: Alexandria Webster, female    DOB: 17-Feb-1966, 58 y.o.   MRN: [id number]  HPI  Pt is a 58 yr old female with hx of cervical myelopathy s/p C5-T1 decompression lamintomy and PCDF 07/19/22 by Dr Louis.  She also has hx of post op pain; COPD, hx of HTN with orthostatic hypotension; DM; Hypothyroidism, and acute on chronic constipation;  Here for  f/u on cervical myelopathy.       Mood has been better since started on Paxil    L knee has good and bad days.  Sleep poor.   At [facility] Lauren [facility]. Cannot see until December 23rd  Dr Marvine office shut down.  Abruptly.  Didn't refill her asthma medicine.   Takes 2 pills/night to sleep- helps somewhat.  Has chronic insomnia.   Doesn't have tv on.  Doesn't get on phone right before bed B/c poor vision. Still working on vision    Drinks caffeine at 6pm.   Had eye surgery so wasn't able to do therapy.  They were afraid would fall.   Walking some- sometimes can do further, but no more than 15-20 ft.  L knee is getting to a bad place.  L knee is buckling a lot.   Uses RW at home, but w/c outside the home . Has a w/c.    Has muscle spasms in L leg.  Occur mainly at night- more than daytime.  Every night.  Taking baclofen 3 mg 3x/day- in AM, lunch and dinner-  has taken this AM.   Found out she HASN'T been taking it. Cannot find Rx for it.   Still taking Midodrine - - as long as she takes her Midodrine , does well.  No dizziness/lightheadedness, etc  Pain Inventory Average Pain 3 Pain Right Now 0 My pain is stabbing  In the last 24 hours, has pain interfered with the following? General activity 2 Relation with others 10 Enjoyment of life 5 What TIME of day is your pain at its worst? evening Sleep (in general) Poor  Pain is worse with: some activites Pain improves with: rest Relief from Meds: na  Family History  Problem Relation Age of Onset   Stroke Mother     Hypertension Mother    Heart failure Father    Asthma Father    Diabetes Father    Heart disease Father    Emphysema Paternal Grandfather    Colon cancer Neg Hx    Colon polyps Neg Hx    Social History   Socioeconomic History   Marital status: Single    Spouse name: Not on file   Number of children: 2   Years of education: Not on file   Highest education level: Not on file  Occupational History   Not on file  Tobacco Use   Smoking status: Former    Current packs/day: 0.00    Types: Cigarettes    Quit date: 03/14/1982    Years since quitting: 41.8    Passive exposure: Past   Smokeless tobacco: Current    Types: Snuff  Vaping Use   Vaping status: Never Used  Substance and Sexual Activity   Alcohol use: No   Drug use: No   Sexual activity: Yes    Birth control/protection: Surgical    Comment: tubal  Other Topics Concern   Not on file  Social History Narrative   Not on file   Social Drivers of Corporate Investment Banker  Strain: Not on file  Food Insecurity: Not on file  Transportation Needs: Not on file  Physical Activity: Not on file  Stress: Not on file  Social Connections: Not on file   Past Surgical History:  Procedure Laterality Date   AIR/FLUID EXCHANGE Right 11/22/2022   Procedure: AIR/FLUID EXCHANGE;  Surgeon: Tobie Baptist, MD;  Location: [facility] OR;  Service: Ophthalmology;  Laterality: Right;   AORTA - BILATERAL FEMORAL ARTERY BYPASS GRAFT N/A 09/27/2018   Procedure: Redo Exposure  AORTOBIFEMORAL Bypass and bilateral femoral Artery ; Redo Aortobifemoral  BYPASS GRAFT.;  Surgeon: Gretta Lonni PARAS, MD;  Location: [facility] OR;  Service: Vascular;  Laterality: N/A;   BACK SURGERY     BIOPSY  10/14/2021   Procedure: BIOPSY;  Surgeon: Cindie Carlin POUR, DO;  [facility];  Service: Endoscopy;;   CATARACT EXTRACTION EXTRACAPSULAR Left 04/13/2023   Procedure: CATARACT EXTRACTION EXTRACAPSULAR WITH INTRAOCULAR LENS PLACEMENT (IOC);  Surgeon: Tobie Baptist, MD;  Location: [facility] OR;  Service: Ophthalmology;  Laterality: Left;  MAC WITH BLOCK   COLONOSCOPY WITH PROPOFOL  N/A 10/14/2021   Procedure: COLONOSCOPY WITH PROPOFOL ;  Surgeon: Cindie Carlin POUR, DO;  [facility];  Service: Endoscopy;  Laterality: N/A;  8:30am   ESOPHAGOGASTRODUODENOSCOPY (EGD) WITH PROPOFOL  N/A 10/14/2021   Procedure: ESOPHAGOGASTRODUODENOSCOPY (EGD) WITH PROPOFOL ;  Surgeon: Cindie Carlin POUR, DO;  [facility];  Service: Endoscopy;  Laterality: N/A;   FEMORAL ARTERY STENT  right leg   INJECTION OF SILICONE OIL Left 09/06/2022   Procedure: INJECTION OF SILICONE OIL;  Surgeon: Tobie Baptist, MD;  Location: [facility] OR;  Service: Ophthalmology;  Laterality: Left;   INJECTION OF SILICONE OIL Right 11/22/2022   Procedure: INJECTION OF SILICONE OIL;  Surgeon: Tobie Baptist, MD;  Location: [facility] OR;  Service: Ophthalmology;  Laterality: Right;   MEMBRANE PEEL Left 09/06/2022   Procedure: MEMBRANECTOMY;  Surgeon: Tobie Baptist, MD;  Location: [facility] OR;  Service: Ophthalmology;  Laterality: Left;   PARS PLANA VITRECTOMY Left 09/06/2022   Procedure: PARS PLANA VITRECTOMY WITH 25 GAUGE;  Surgeon: Tobie Baptist, MD;  Location: [facility] OR;  Service: Ophthalmology;  Laterality: Left;   PARS PLANA VITRECTOMY Left 02/14/2023   Procedure: PARS PLANA VITRECTOMY WITH 25 GAUGE;  Surgeon: Tobie Baptist, MD;  Location: [facility] OR;  Service: Ophthalmology;  Laterality: Left;   PARS PLANA VITRECTOMY Right 12/19/2023   Procedure: SECONDARY IMPLANT OF INTRAOCULAR LENS;  Surgeon: Tobie Baptist, MD;  Location: [facility] OR;  Service: Ophthalmology;  Laterality: Right;   PARS PLANA VITRECTOMY W/ ENDOLASER PANRETINAL PHOTOCOAGULATION Right 09/12/2023   Procedure: PARS PLANA VITRECTOMY WITH 25 GAUGE WITH ENDOLASER PANRETINAL PHOTOCOAGULATION;  Surgeon: Tobie Baptist, MD;  Location: [facility] OR;  Service: Ophthalmology;  Laterality: Right;  MAC WITH BLOCK   PHOTOCOAGULATION WITH LASER Left 09/06/2022    Procedure: PHOTOCOAGULATION WITH LASER;  Surgeon: Tobie Baptist, MD;  Location: [facility] OR;  Service: Ophthalmology;  Laterality: Left;   PHOTOCOAGULATION WITH LASER Right 11/22/2022   Procedure: PHOTOCOAGULATION WITH LASER;  Surgeon: Tobie Baptist, MD;  Location: [facility] OR;  Service: Ophthalmology;  Laterality: Right;   PHOTOCOAGULATION WITH LASER Left 02/14/2023   Procedure: PHOTOCOAGULATION WITH LASER;  Surgeon: Tobie Baptist, MD;  Location: [facility] OR;  Service: Ophthalmology;  Laterality: Left;   POSTERIOR CERVICAL FUSION/FORAMINOTOMY N/A 07/19/2022   Procedure: Posterior cervical fusion with lateral mass fixation - Cervical four-cervical five, Cervical five-Cervical six - Cervical six-Cervical seven - Cervical seven-Thoracic one with laminectomy;  Surgeon: Louis Shove, MD;  Location: [facility] OR;  Service: Neurosurgery;  Laterality: N/A;   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   Procedure: REPAIR OF COMPLEX TRACTION RETINAL DETACHMENT;  Surgeon: Tobie Baptist, MD;  Location: [facility] OR;  Service: Ophthalmology;  Laterality: Left;  MAC WITH BLOCK   REPAIR OF COMPLEX TRACTION RETINAL DETACHMENT Right 11/22/2022   Procedure: REPAIR OF COMPLEX TRACTION RETINAL DETACHMENT;  Surgeon: Tobie Baptist, MD;  Location: [facility] OR;  Service: Ophthalmology;  Laterality: Right;  MAC WITH BLOCK   SILICON OIL REMOVAL Left 02/14/2023   Procedure: SILICON OIL REMOVAL;  Surgeon: Tobie Baptist, MD;  Location: [facility] OR;  Service: Ophthalmology;  Laterality: Left;   TUBAL LIGATION     VITRECTOMY AND CATARACT Right 10/24/2023   Procedure: EXTRACTION, CATARACT, WITH VITRECTOMY;  Surgeon: Tobie Baptist, MD;  Location: [facility] OR;  Service: Ophthalmology;  Laterality: Right;  BLOCK   Past Surgical History:  Procedure Laterality Date   AIR/FLUID EXCHANGE Right 11/22/2022   Procedure: AIR/FLUID EXCHANGE;  Surgeon: Tobie Baptist, MD;  Location: [facility] OR;  Service: Ophthalmology;  Laterality: Right;   AORTA - BILATERAL FEMORAL  ARTERY BYPASS GRAFT N/A 09/27/2018   Procedure: Redo Exposure  AORTOBIFEMORAL Bypass and bilateral femoral Artery ; Redo Aortobifemoral  BYPASS GRAFT.;  Surgeon: Gretta Lonni PARAS, MD;  Location: [facility] OR;  Service: Vascular;  Laterality: N/A;   BACK SURGERY     BIOPSY  10/14/2021   Procedure: BIOPSY;  Surgeon: Cindie Carlin POUR, DO;  [facility];  Service: Endoscopy;;   CATARACT EXTRACTION EXTRACAPSULAR Left 04/13/2023   Procedure: CATARACT EXTRACTION EXTRACAPSULAR WITH INTRAOCULAR LENS PLACEMENT (IOC);  Surgeon: Tobie Baptist, MD;  Location: [facility] OR;  Service: Ophthalmology;  Laterality: Left;  MAC WITH BLOCK   COLONOSCOPY WITH PROPOFOL  N/A 10/14/2021   Procedure: COLONOSCOPY WITH PROPOFOL ;  Surgeon: Cindie Carlin POUR, DO;  [facility];  Service: Endoscopy;  Laterality: N/A;  8:30am   ESOPHAGOGASTRODUODENOSCOPY (EGD) WITH PROPOFOL  N/A 10/14/2021   Procedure: ESOPHAGOGASTRODUODENOSCOPY (EGD) WITH PROPOFOL ;  Surgeon: Cindie Carlin POUR, DO;  [facility];  Service: Endoscopy;  Laterality: N/A;   FEMORAL ARTERY STENT  right leg   INJECTION OF SILICONE OIL Left 09/06/2022   Procedure: INJECTION OF SILICONE OIL;  Surgeon: Tobie Baptist, MD;  Location: [facility] OR;  Service: Ophthalmology;  Laterality: Left;   INJECTION OF SILICONE OIL Right 11/22/2022   Procedure: INJECTION OF SILICONE OIL;  Surgeon: Tobie Baptist, MD;  Location: [facility] OR;  Service: Ophthalmology;  Laterality: Right;   MEMBRANE PEEL Left 09/06/2022   Procedure: MEMBRANECTOMY;  Surgeon: Tobie Baptist, MD;  Location: [facility] OR;  Service: Ophthalmology;  Laterality: Left;   PARS PLANA VITRECTOMY Left 09/06/2022   Procedure: PARS PLANA VITRECTOMY WITH 25 GAUGE;  Surgeon: Tobie Baptist, MD;  Location: [facility] OR;  Service: Ophthalmology;  Laterality: Left;   PARS PLANA VITRECTOMY Left 02/14/2023   Procedure: PARS PLANA VITRECTOMY WITH 25 GAUGE;  Surgeon: Tobie Baptist, MD;  Location: [facility] OR;  Service: Ophthalmology;   Laterality: Left;   PARS PLANA VITRECTOMY Right 12/19/2023   Procedure: SECONDARY IMPLANT OF INTRAOCULAR LENS;  Surgeon: Tobie Baptist, MD;  Location: [facility] OR;  Service: Ophthalmology;  Laterality: Right;   PARS PLANA VITRECTOMY W/ ENDOLASER PANRETINAL PHOTOCOAGULATION Right 09/12/2023   Procedure: PARS PLANA VITRECTOMY WITH 25 GAUGE WITH ENDOLASER PANRETINAL PHOTOCOAGULATION;  Surgeon: Tobie Baptist, MD;  Location: [facility] OR;  Service: Ophthalmology;  Laterality: Right;  MAC WITH BLOCK   PHOTOCOAGULATION WITH LASER Left 09/06/2022   Procedure: PHOTOCOAGULATION WITH LASER;  Surgeon: Tobie Baptist, MD;  Location: [facility] OR;  Service: Ophthalmology;  Laterality: Left;   PHOTOCOAGULATION WITH LASER Right 11/22/2022   Procedure: PHOTOCOAGULATION WITH LASER;  Surgeon: Tobie Baptist, MD;  Location: [facility] OR;  Service: Ophthalmology;  Laterality: Right;   PHOTOCOAGULATION WITH LASER Left 02/14/2023   Procedure: PHOTOCOAGULATION WITH LASER;  Surgeon: Tobie Baptist, MD;  Location: [facility] OR;  Service: Ophthalmology;  Laterality: Left;   POSTERIOR CERVICAL FUSION/FORAMINOTOMY N/A 07/19/2022   Procedure: Posterior cervical fusion with lateral mass fixation - Cervical four-cervical five, Cervical five-Cervical six - Cervical six-Cervical seven - Cervical seven-Thoracic one with laminectomy;  Surgeon: Louis Shove, MD;  Location: [facility] OR;  Service: Neurosurgery;  Laterality: N/A;   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   Procedure: REPAIR OF COMPLEX TRACTION RETINAL DETACHMENT;  Surgeon: Tobie Baptist, MD;  Location: [facility] OR;  Service: Ophthalmology;  Laterality: Left;  MAC WITH BLOCK   REPAIR OF COMPLEX TRACTION RETINAL DETACHMENT Right 11/22/2022   Procedure: REPAIR OF COMPLEX TRACTION RETINAL DETACHMENT;  Surgeon: Tobie Baptist, MD;  Location: [facility] OR;  Service: Ophthalmology;  Laterality: Right;  MAC WITH BLOCK   SILICON OIL REMOVAL Left 02/14/2023   Procedure: SILICON OIL REMOVAL;  Surgeon: Tobie Baptist, MD;  Location: [facility] OR;  Service: Ophthalmology;  Laterality: Left;   TUBAL LIGATION     VITRECTOMY AND CATARACT Right 10/24/2023   Procedure: EXTRACTION, CATARACT, WITH VITRECTOMY;  Surgeon: Tobie Baptist, MD;  Location: [facility] OR;  Service: Ophthalmology;  Laterality: Right;  BLOCK   Past Medical History:  Diagnosis Date   Arthritis    Asthma    COPD (chronic obstructive pulmonary disease) (HCC)    DDD (degenerative disc disease), lumbar    Depression    Diabetes mellitus    Gastroparesis    GERD without esophagitis    High cholesterol    Hypertension    Hypothyroidism    PONV (postoperative nausea and vomiting)    PVD (peripheral vascular disease)    Wears glasses    BP (!) 142/94   Pulse 91   Ht 5' 4 (1.626 m)   Wt 114 lb (51.7 kg) Comment: reported  LMP 01/21/2012   BMI 19.57 kg/m   Opioid Risk Score:   Fall Risk Score:  `1  Depression screen [facility] 2/9     01/26/2024    8:59 AM 03/09/2023   11:27 AM 12/26/2022   10:43 AM 08/15/2022    8:47 AM 05/09/2016   11:34 AM 10/14/2015    1:42 PM 09/28/2015    3:08 PM  Depression screen PHQ 2/9  Decreased Interest 0 1 1 3  0 0 0  Down, Depressed, Hopeless 0 1 1 2  0 0 0  PHQ - 2 Score 0 2 2 5  0 0 0  Altered sleeping    2     Tired, decreased energy    2     Change in appetite    3     Feeling bad or failure about yourself     0     Trouble concentrating    3     Moving slowly or fidgety/restless    0     Suicidal thoughts    0     PHQ-9 Score    15         Data saved with a previous flowsheet row definition      Review of Systems  Musculoskeletal:  Positive for neck pain.  All other systems reviewed and  are negative.      Objective:   Physical Exam   Awake, alert, appropriate, in transport w/c; accompanied by daughter, NAD  MAS of L knee flexion and extension MAS of 2-3; L ankle DF and PF MAS of 2; L hip abduction, flexion is MAS of 1+ Has 2-3 beats clonus LLE as well  Also no hoffman's B/L, but has  MAS of 1+ to 2 in LUE- mainly L elbow flexion and extension- and shoulder MAS 1+ esp with abduction and external rotation; and L wrist/hand MAS of 1+      Assessment & Plan:   Pt is a 58 yr old female with hx of cervical myelopathy s/p C5-T1 decompression lamintomy and PCDF 07/19/22 by Dr Louis.  She also has hx of post op pain; COPD, hx of HTN with orthostatic hypotension; DM; Hypothyroidism, and acute on chronic constipation;  Here for  f/u on cervical myelopathy.      Will send in Albuterol  2 mg daily pills for pt until sees new PCP.   2.  Do not drink coffee of soda no later than noon.   So you can sleep at night, this can make a big difference. Chronic insomnia.   3.  Con't Midodrine  - BP is 142/94 today- will try to decrease Midodrine  to  5 mg 3x/day (1/2 tab)- but if symptoms come back, then go back to 10 mg 3x/day.  -doesn't need refill today- last filled 11/03/23  4. Never got into therapy- so will rewrite for outpt PT  Pt with cervical myelopathy- needs additional therapy for gait, balance and strengthening- hx of multiple eye surgeries with poor vision.   5. Will get Xray of L knee due to buckling frequently- cannot walk >15-20 ft max, so this is needed to make sure we can address her gait.   6.  Actually hasn't been taking baclofen- didn't have Rx- - so will restart 5 mg 3x/day x 1 week, then 10 mg/1 tab 3x/day.  The first dose of baclofen for first hour, be with someone.  Just to make sure- if has throat closing up , go to ER immediately or call 911.  -spasticity is due to compression in your neck- and due to prior compression- so spasticity tells not getting al the signals to the muscles from the brain- and can increase til 2 years post surgery- however, will plateau at that point  Can get worse with an infection- barometer- to tell you if you are getting an infection. Spasticity means tightness and spasms.   7.  For sleep and anxiety- Sent in refill of Hydroxyzine  25 mg- to  help with anxiety and sleep.    8. 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 F/U in 3months double appt. SCI   I spent a total of 41   minutes on total care today- >50% coordination of care- due to d/w pt about mood, sleep and education on sleep hygiene, as well as L knee xray for knee buckling- and d/w pt about overall prognosis and education on spasticity- increases for up to 2 years.

## 2024-02-09 ENCOUNTER — Other Ambulatory Visit: Payer: Self-pay | Admitting: Physical Medicine and Rehabilitation

## 2024-02-14 ENCOUNTER — Telehealth: Payer: Self-pay | Admitting: Physical Medicine and Rehabilitation

## 2024-02-14 NOTE — Telephone Encounter (Signed)
 Pt called about her baclofen  refill being sent in again

## 2024-02-15 MED ORDER — BACLOFEN 10 MG PO TABS: 10.0000 mg | ORAL_TABLET | Freq: Three times a day (TID) | ORAL | 5 refills | Status: DC

## 2024-02-19 ENCOUNTER — Telehealth: Payer: Self-pay

## 2024-02-19 NOTE — Telephone Encounter (Signed)
 Patient daughter called stating that Baclofen  10 mg was called in for patient but she can not take it. She states patient has an allergic reaction to Baclofen  which is confusion, excessive sweating, dry mouth. Need something different.

## 2024-02-22 ENCOUNTER — Telehealth: Payer: Self-pay

## 2024-02-22 DIAGNOSIS — E113511 Type 2 diabetes mellitus with proliferative diabetic retinopathy with macular edema, right eye: Secondary | ICD-10-CM | POA: Diagnosis not present

## 2024-02-22 MED ORDER — DANTROLENE SODIUM 50 MG PO CAPS: 50.0000 mg | ORAL_CAPSULE | Freq: Every day | ORAL | 5 refills | Status: AC

## 2024-02-22 NOTE — Telephone Encounter (Signed)
 When patient took Baclofen , she got very confused, some agitation and some abnormal sweating- will add to allergies.   Due to low Blood pressure, cannot take Zanaflex, which commonly causes that as well as severe sedation.   Will start Dantrolene 50 mg at bedtime x 1 week, then 50 mg BID x 1 week, then 50 mg TID x 1 week, then 100 mg BID- for spasticity not resolved with other meds.   Can cause mild weakness feeling because taking spasticity away; can also cause less sedation than other spasticity meds that affect the brain (it works in the muscles) and rarely can affect Liver- that's why we will check LFTs/CMP at 1 month, ~ 3 months and q 6 months while on medicine.  Advised pt's daughter and daughter of these symptoms.  Will submit prior auth based on reaction with Baclofen .

## 2024-02-22 NOTE — Telephone Encounter (Signed)
 PA for Baclofen  50 MG submitted in Cover My Med

## 2024-02-26 NOTE — Telephone Encounter (Addendum)
 PA for baclofen  was approved but patient can not take this, a PA is needed for Dantrolene . I am submitting this now.

## 2024-02-26 NOTE — Telephone Encounter (Signed)
 Attempted to do a PA for dantrolene  on cover my meds and it came back stating a PA is not required. I called patient's daughter back and left a vm letting her know this information and that they should be able to get that medication at the pharmacy. I asked them to call if there were any issues.

## 2024-02-27 NOTE — Telephone Encounter (Signed)
 Dantrolene  Sodium Cap 50 MG has been approved. 02/22/2024-02/21/2025. Pharmacy informed.

## 2024-03-01 ENCOUNTER — Ambulatory Visit (HOSPITAL_COMMUNITY): Attending: Physical Medicine and Rehabilitation

## 2024-03-05 ENCOUNTER — Ambulatory Visit: Payer: Self-pay

## 2024-03-05 VITALS — BP 112/68 | HR 78 | Ht 64.0 in | Wt 114.0 lb

## 2024-03-05 DIAGNOSIS — J4540 Moderate persistent asthma, uncomplicated: Secondary | ICD-10-CM | POA: Diagnosis not present

## 2024-03-05 DIAGNOSIS — E782 Mixed hyperlipidemia: Secondary | ICD-10-CM | POA: Diagnosis not present

## 2024-03-05 DIAGNOSIS — Z23 Encounter for immunization: Secondary | ICD-10-CM | POA: Diagnosis not present

## 2024-03-05 DIAGNOSIS — I10 Essential (primary) hypertension: Secondary | ICD-10-CM | POA: Diagnosis not present

## 2024-03-05 MED ORDER — ALBUTEROL SULFATE 2 MG PO TABS: 2.0000 mg | ORAL_TABLET | Freq: Every day | ORAL | 3 refills | Status: AC

## 2024-03-05 MED ORDER — LOVASTATIN 20 MG PO TABS: 20.0000 mg | ORAL_TABLET | Freq: Every day | ORAL | 3 refills | Status: AC

## 2024-03-05 NOTE — Progress Notes (Signed)
 "  New Patient Office Visit  Subjective    Patient ID: Alexandria Webster, female    DOB: [birth year]  Age: 58 y.o. MRN: [id number]  CC:  Chief Complaint  Patient presents with   Establish Care    Pt here to establish care, has no new concerns just wants to manage what's current     HPI Alexandria Webster presents to establish care Discussed the use of AI scribe software for clinical note transcription with the patient, who gave verbal consent to proceed.  History of Present Illness    Alexandria Webster is a 58 year old female with hypertension and diabetes who presents for medication refills and blood pressure management.  Hypertension and blood pressure fluctuations - Episodes of sudden rises and drops in blood pressure - Associated symptoms include sweating and near-blackout episodes - Takes antihypertensive medication for management  Diabetes mellitus - Diabetes managed with medication  Asthma - Uses albuterol  tablets for asthma management - Requires medication refills  Hyperlipidemia - Takes cholesterol-lowering medication managed by another provider  Thyroid  disorder - Takes thyroid  medication managed by another provider  Post-neck fusion surgery and visual complications - History of fall in May of previous year resulting in neck fusion surgery - Postoperative complications include retinal detachment in both eyes - Has undergone multiple eye surgeries and continues to receive treatments - Vision is not perfect but sufficient for daily activities  Lipoma - Lipoma present on head - Occasionally changes in size but currently asymptomatic - Family history of lipomas (father)  Thrombocytopenia and mobility impairment - History of spine thrombocytopenia - Impaired mobility, particularly on the left side - Awaiting further evaluation in the coming spring to assess mobility      Outpatient Encounter Medications as of 03/05/2024  Medication Sig   albuterol  (VENTOLIN   HFA) 108 (90 Base) MCG/ACT inhaler Inhale 1-2 puffs into the lungs every 4 (four) hours as needed for wheezing or shortness of breath.   aspirin  325 MG tablet Take 325 mg by mouth in the morning.   brimonidine (ALPHAGAN) 0.2 % ophthalmic solution Place 1 drop into the right eye 2 (two) times daily.   cetirizine (ZYRTEC) 10 MG tablet Take 10 mg by mouth daily.   dantrolene  (DANTRIUM ) 50 MG capsule Take 1 capsule (50 mg total) by mouth at bedtime. X 1 week, then 50 mg BID x 1 week, then 50 mg TID x 1 week, then 100 mg BID- for spasticity due to spinal cord injury- with confusion/agitation on baclofen -   docusate sodium  (COLACE) 100 MG capsule Take 200 mg by mouth every morning.   Dulaglutide  (TRULICITY ) 1.5 MG/0.5ML SOAJ Inject 1.5 mg into the skin once a week.   esomeprazole  (NEXIUM ) 40 MG capsule Take 1 capsule (40 mg total) by mouth 2 (two) times daily before a meal.   ipratropium (ATROVENT ) 0.03 % nasal spray Place 3 sprays into both nostrils 3 (three) times daily as needed for rhinitis.   levothyroxine  (SYNTHROID ) 50 MCG tablet Take 1 tablet (50 mcg total) by mouth daily before breakfast.   linaclotide  (LINZESS ) 290 MCG CAPS capsule Take 1 capsule (290 mcg total) by mouth daily before breakfast.   meclizine  (ANTIVERT ) 25 MG tablet Take 1 tablet (25 mg total) by mouth 3 (three) times daily as needed for dizziness.   midodrine  (PROAMATINE ) 10 MG tablet TAKE ONE TABLET BY MOUTH THREE TIMES DAILY   ofloxacin  (OCUFLOX ) 0.3 % ophthalmic solution Instill 1 drop into affected eye four times daily for  7 days.   ondansetron  (ZOFRAN -ODT) 4 MG disintegrating tablet Take 1 tablet (4 mg total) by mouth every 8 (eight) hours as needed for nausea or vomiting.   PARoxetine  (PAXIL ) 20 MG tablet Take 1-2 tablets (20-40 mg total) by mouth daily. Can also take at night if need be.   polyethylene glycol (MIRALAX  / GLYCOLAX ) 17 g packet Take 17 g by mouth daily.   prednisoLONE acetate (PRED FORTE) 1 % ophthalmic  suspension Location Right Eye. Instill one drop in the right eye four times a day for a week, then three times a day for a week, then two times a day for a week and then one time a day for a week.   timolol (TIMOPTIC) 0.25 % ophthalmic solution Place 1 drop into the right eye 2 (two) times daily.   [DISCONTINUED] albuterol  (PROVENTIL ) 2 MG tablet Take 1 tablet (2 mg total) by mouth daily. Filled only til sees new PCP-   [DISCONTINUED] lovastatin  (MEVACOR ) 20 MG tablet Take 1 tablet (20 mg total) by mouth at bedtime. (Patient taking differently: Take 20 mg by mouth in the morning.)   albuterol  (PROVENTIL ) 2 MG tablet Take 1 tablet (2 mg total) by mouth daily.   lovastatin  (MEVACOR ) 20 MG tablet Take 1 tablet (20 mg total) by mouth at bedtime.   [DISCONTINUED] baclofen  (LIORESAL ) 10 MG tablet Take 0.5 tablets (5 mg total) by mouth 3 (three) times daily. 1/2 tab 3x/day x 1 week; then increase to 1 tab/10 mg 3x/day- for spasticity- (Patient not taking: Reported on 03/05/2024)   [DISCONTINUED] baclofen  (LIORESAL ) 10 MG tablet Take 1 tablet (10 mg total) by mouth 3 (three) times daily. (Patient not taking: Reported on 03/05/2024)   [DISCONTINUED] hydrOXYzine  (ATARAX ) 25 MG tablet Take 1 tablet (25 mg total) by mouth 3 (three) times daily as needed for anxiety (TID prn for anxiety and then  can take up to 2 pills/nightly for sleep). (Patient not taking: Reported on 03/05/2024)   No facility-administered encounter medications on file as of 03/05/2024.    Past Medical History:  Diagnosis Date   Arthritis    Asthma    COPD (chronic obstructive pulmonary disease) (HCC)    DDD (degenerative disc disease), lumbar    Depression    Diabetes mellitus    Gastroparesis    GERD without esophagitis    High cholesterol    Hypertension    Hypothyroidism    PONV (postoperative nausea and vomiting)    PVD (peripheral vascular disease)    Wears glasses     Past Surgical History:  Procedure Laterality Date    AIR/FLUID EXCHANGE Right 11/22/2022   Procedure: AIR/FLUID EXCHANGE;  Surgeon: Tobie Baptist, MD;  Location: [facility] OR;  Service: Ophthalmology;  Laterality: Right;   AORTA - BILATERAL FEMORAL ARTERY BYPASS GRAFT N/A 09/27/2018   Procedure: Redo Exposure  AORTOBIFEMORAL Bypass and bilateral femoral Artery ; Redo Aortobifemoral  BYPASS GRAFT.;  Surgeon: Gretta Lonni PARAS, MD;  Location: [facility] OR;  Service: Vascular;  Laterality: N/A;   BACK SURGERY     BIOPSY  10/14/2021   Procedure: BIOPSY;  Surgeon: Cindie Carlin POUR, DO;  Location: [facility];  Service: Endoscopy;;   CATARACT EXTRACTION EXTRACAPSULAR Left 04/13/2023   Procedure: CATARACT EXTRACTION EXTRACAPSULAR WITH INTRAOCULAR LENS PLACEMENT (IOC);  Surgeon: Tobie Baptist, MD;  Location: [facility] OR;  Service: Ophthalmology;  Laterality: Left;  MAC WITH BLOCK   COLONOSCOPY WITH PROPOFOL  N/A 10/14/2021   Procedure: COLONOSCOPY WITH PROPOFOL ;  Surgeon: Cindie Carlin POUR,  DO;  [facility]: [facility];  Service: Endoscopy;  Laterality: N/A;  8:30am   ESOPHAGOGASTRODUODENOSCOPY (EGD) WITH PROPOFOL  N/A 10/14/2021   Procedure: ESOPHAGOGASTRODUODENOSCOPY (EGD) WITH PROPOFOL ;  Surgeon: Cindie Carlin POUR, DO;  [facility]: [facility];  Service: Endoscopy;  Laterality: N/A;   FEMORAL ARTERY STENT  right leg   INJECTION OF SILICONE OIL Left 09/06/2022   Procedure: INJECTION OF SILICONE OIL;  Surgeon: Tobie Baptist, MD;  Location: [facility] OR;  Service: Ophthalmology;  Laterality: Left;   INJECTION OF SILICONE OIL Right 11/22/2022   Procedure: INJECTION OF SILICONE OIL;  Surgeon: Tobie Baptist, MD;  Location: [facility] OR;  Service: Ophthalmology;  Laterality: Right;   MEMBRANE PEEL Left 09/06/2022   Procedure: MEMBRANECTOMY;  Surgeon: Tobie Baptist, MD;  Location: [facility] OR;  Service: Ophthalmology;  Laterality: Left;   PARS PLANA VITRECTOMY Left 09/06/2022   Procedure: PARS PLANA VITRECTOMY WITH 25 GAUGE;  Surgeon: Tobie Baptist, MD;  Location: [facility] OR;  Service:  Ophthalmology;  Laterality: Left;   PARS PLANA VITRECTOMY Left 02/14/2023   Procedure: PARS PLANA VITRECTOMY WITH 25 GAUGE;  Surgeon: Tobie Baptist, MD;  Location: [facility] OR;  Service: Ophthalmology;  Laterality: Left;   PARS PLANA VITRECTOMY Right 12/19/2023   Procedure: SECONDARY IMPLANT OF INTRAOCULAR LENS;  Surgeon: Tobie Baptist, MD;  Location: [facility] OR;  Service: Ophthalmology;  Laterality: Right;   PARS PLANA VITRECTOMY W/ ENDOLASER PANRETINAL PHOTOCOAGULATION Right 09/12/2023   Procedure: PARS PLANA VITRECTOMY WITH 25 GAUGE WITH ENDOLASER PANRETINAL PHOTOCOAGULATION;  Surgeon: Tobie Baptist, MD;  Location: [facility] OR;  Service: Ophthalmology;  Laterality: Right;  MAC WITH BLOCK   PHOTOCOAGULATION WITH LASER Left 09/06/2022   Procedure: PHOTOCOAGULATION WITH LASER;  Surgeon: Tobie Baptist, MD;  Location: [facility] OR;  Service: Ophthalmology;  Laterality: Left;   PHOTOCOAGULATION WITH LASER Right 11/22/2022   Procedure: PHOTOCOAGULATION WITH LASER;  Surgeon: Tobie Baptist, MD;  Location: [facility] OR;  Service: Ophthalmology;  Laterality: Right;   PHOTOCOAGULATION WITH LASER Left 02/14/2023   Procedure: PHOTOCOAGULATION WITH LASER;  Surgeon: Tobie Baptist, MD;  Location: [facility] OR;  Service: Ophthalmology;  Laterality: Left;   POSTERIOR CERVICAL FUSION/FORAMINOTOMY N/A 07/19/2022   Procedure: Posterior cervical fusion with lateral mass fixation - Cervical four-cervical five, Cervical five-Cervical six - Cervical six-Cervical seven - Cervical seven-Thoracic one with laminectomy;  Surgeon: Louis Shove, MD;  Location: [facility] OR;  Service: Neurosurgery;  Laterality: N/A;   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   Procedure: REPAIR OF COMPLEX TRACTION RETINAL DETACHMENT;  Surgeon: Tobie Baptist, MD;  Location: [facility] OR;  Service: Ophthalmology;  Laterality: Left;  MAC WITH BLOCK   REPAIR OF COMPLEX TRACTION RETINAL DETACHMENT Right 11/22/2022   Procedure: REPAIR OF COMPLEX TRACTION RETINAL DETACHMENT;   Surgeon: Tobie Baptist, MD;  Location: [facility] OR;  Service: Ophthalmology;  Laterality: Right;  MAC WITH BLOCK   SILICON OIL REMOVAL Left 02/14/2023   Procedure: SILICON OIL REMOVAL;  Surgeon: Tobie Baptist, MD;  Location: [facility] OR;  Service: Ophthalmology;  Laterality: Left;   TUBAL LIGATION     VITRECTOMY AND CATARACT Right 10/24/2023   Procedure: EXTRACTION, CATARACT, WITH VITRECTOMY;  Surgeon: Tobie Baptist, MD;  Location: [facility] OR;  Service: Ophthalmology;  Laterality: Right;  BLOCK    Family History  Problem Relation Age of Onset   Stroke Mother    Hypertension Mother    Heart failure Father    Asthma Father    Diabetes Father    Heart disease Father    Emphysema Paternal Grandfather  Colon cancer Neg Hx    Colon polyps Neg Hx     Social History   Socioeconomic History   Marital status: Single    Spouse name: Not on file   Number of children: 2   Years of education: Not on file   Highest education level: Not on file  Occupational History   Not on file  Tobacco Use   Smoking status: Former    Current packs/day: 0.00    Types: Cigarettes    Quit date: 03/14/1982    Years since quitting: 42.0    Passive exposure: Past   Smokeless tobacco: Current    Types: Snuff  Vaping Use   Vaping status: Never Used  Substance and Sexual Activity   Alcohol use: No   Drug use: No   Sexual activity: Yes    Birth control/protection: Surgical    Comment: tubal  Other Topics Concern   Not on file  Social History Narrative   Not on file   Social Drivers of Health   Tobacco Use: High Risk (03/05/2024)   Patient History    Smoking Tobacco Use: Former    Smokeless Tobacco Use: Current    Passive Exposure: Past  Physicist, Medical Strain: Not on [employer] Insecurity: Not on file  Transportation Needs: Not on file  Physical Activity: Not on file  Stress: Not on file  Social Connections: Not on file  Intimate Partner Violence: Not on file  Depression (PHQ2-9): Medium Risk  (03/05/2024)   Depression (PHQ2-9)    PHQ-2 Score: 9  Alcohol Screen: Not on file  Housing: Not on file  Utilities: Not on file  Health Literacy: Not on file    ROS      Objective    BP 112/68 (BP Location: Left Arm, Patient Position: Sitting, Cuff Size: Normal)   Pulse 78   Ht 5' 4 (1.626 m)   Wt 114 lb (51.7 kg)   LMP 01/21/2012   SpO2 96%   BMI 19.57 kg/m   Physical Exam Vitals and nursing note reviewed. Exam conducted with a chaperone present (pt's daughter is with her today.).  Constitutional:      Appearance: Normal appearance.  HENT:     Head: Normocephalic.  Eyes:     Extraocular Movements: Extraocular movements intact.     Pupils: Pupils are equal, round, and reactive to light.  Cardiovascular:     Rate and Rhythm: Normal rate and regular rhythm.  Pulmonary:     Effort: Pulmonary effort is normal.     Breath sounds: Normal breath sounds.  Musculoskeletal:     Cervical back: Normal range of motion and neck supple.  Skin:    General: Skin is warm and dry.  Neurological:     Mental Status: She is alert and oriented to person, place, and time.     Motor: Weakness (in [place] present.  Psychiatric:        Mood and Affect: Mood normal.        Thought Content: Thought content normal.         Assessment & Plan:   Problem List Items Addressed This Visit       Cardiovascular and Mediastinum   Essential hypertension, benign - Primary   Blood pressure fluctuates with episodes of hypotension leading to near syncope, possibly related to spinal cord injury and autonomic dysreflexia. - Continue current antihypertensive medication regimen.      Relevant Medications   lovastatin  (MEVACOR ) 20 MG tablet  Respiratory   Asthma   Requires medication refill. - Refilled albuterol  tablet prescription.      Relevant Medications   albuterol  (PROVENTIL ) 2 MG tablet     Other   Mixed hyperlipidemia   Requires medication refill of lovastatin  20 mg.  Will  update labs at future visit.        Relevant Medications   lovastatin  (MEVACOR ) 20 MG tablet   Other Visit Diagnoses       Encounter for immunization       Relevant Orders   Flu vaccine trivalent PF, 6mos and older(Flulaval,Afluria,Fluarix,Fluzone) (Completed)         Return in about 6 months (around 09/03/2024) for chronic follow-up with PCP.   Leita Longs, FNP   "

## 2024-03-07 DIAGNOSIS — E782 Mixed hyperlipidemia: Secondary | ICD-10-CM | POA: Insufficient documentation

## 2024-03-07 NOTE — Assessment & Plan Note (Signed)
 Requires medication refill. - Refilled albuterol  tablet prescription.

## 2024-03-07 NOTE — Assessment & Plan Note (Signed)
 Requires medication refill of lovastatin  20 mg.  Will update labs at future visit.

## 2024-03-07 NOTE — Assessment & Plan Note (Signed)
 Blood pressure fluctuates with episodes of hypotension leading to near syncope, possibly related to spinal cord injury and autonomic dysreflexia. - Continue current antihypertensive medication regimen.

## 2024-03-18 ENCOUNTER — Telehealth: Payer: Self-pay

## 2024-03-18 ENCOUNTER — Other Ambulatory Visit: Payer: Self-pay

## 2024-03-18 DIAGNOSIS — F5101 Primary insomnia: Secondary | ICD-10-CM

## 2024-03-18 MED ORDER — TRAZODONE HCL 50 MG PO TABS: 50.0000 mg | ORAL_TABLET | Freq: Every evening | ORAL | 3 refills | Status: AC | PRN

## 2024-03-18 NOTE — Telephone Encounter (Signed)
 Trazodone  sent to pharmacy.  It looks like she has taken this in the past

## 2024-03-18 NOTE — Telephone Encounter (Signed)
"  Pt advised  "

## 2024-03-18 NOTE — Telephone Encounter (Signed)
 Copied from CRM [id number]. Topic: Clinical - Medication Question >> Mar 18, 2024  8:27 AM Treva T wrote: Reason for CRM: Pt daughter calling reports pt is having issues with sleeping, and wanted to know if she can have medication sent to pharmacy for helping with sleeping.  States she has taken medication before to help with sleep, but unsure of the name of medication.   Preferred pharmacy:  [facility] [place], [place] - [address] [place] [address] Phone: [phone] Fax: [phone]   [phone]

## 2024-03-19 ENCOUNTER — Encounter: Payer: Self-pay | Admitting: Physical Medicine and Rehabilitation

## 2024-04-04 NOTE — Therapy (Addendum)
 " OUTPATIENT PHYSICAL THERAPY CERVICAL EVALUATION   Patient Name: Alexandria Webster MRN: [id number] DOB:1966/01/10, 59 y.o., female Today's Date: 04/05/2024  END OF SESSION:  PT End of Session - 04/05/24 1328     Visit Number 1    Date for Recertification  05/17/24    Authorization Type [place] MEDICAID [facility]    Authorization Time Period seeking auth    Progress Note Due on Visit 10    PT Start Time 1246    PT Stop Time 1326    PT Time Calculation (min) 40 min    Equipment Utilized During Treatment Gait belt    Activity Tolerance Patient tolerated treatment well    Behavior During Therapy WFL for tasks assessed/performed          Past Medical History:  Diagnosis Date   Arthritis    Asthma    COPD (chronic obstructive pulmonary disease) (HCC)    DDD (degenerative disc disease), lumbar    Depression    Diabetes mellitus    Gastroparesis    GERD without esophagitis    High cholesterol    Hypertension    Hypothyroidism    PONV (postoperative nausea and vomiting)    PVD (peripheral vascular disease)    Wears glasses    Past Surgical History:  Procedure Laterality Date   AIR/FLUID EXCHANGE Right 11/22/2022   Procedure: AIR/FLUID EXCHANGE;  Surgeon: Tobie Baptist, MD;  Location: [facility] OR;  Service: Ophthalmology;  Laterality: Right;   AORTA - BILATERAL FEMORAL ARTERY BYPASS GRAFT N/A 09/27/2018   Procedure: Redo Exposure  AORTOBIFEMORAL Bypass and bilateral femoral Artery ; Redo Aortobifemoral  BYPASS GRAFT.;  Surgeon: Gretta Lonni PARAS, MD;  Location: [facility] OR;  Service: Vascular;  Laterality: N/A;   BACK SURGERY     BIOPSY  10/14/2021   Procedure: BIOPSY;  Surgeon: Cindie Carlin POUR, DO;  [facility]: [facility];  Service: Endoscopy;;   CATARACT EXTRACTION EXTRACAPSULAR Left 04/13/2023   Procedure: CATARACT EXTRACTION EXTRACAPSULAR WITH INTRAOCULAR LENS PLACEMENT (IOC);  Surgeon: Tobie Baptist, MD;  Location: [facility] OR;  Service: Ophthalmology;  Laterality: Left;  MAC WITH BLOCK    COLONOSCOPY WITH PROPOFOL  N/A 10/14/2021   Procedure: COLONOSCOPY WITH PROPOFOL ;  Surgeon: Cindie Carlin POUR, DO;  [facility];  Service: Endoscopy;  Laterality: N/A;  8:30am   ESOPHAGOGASTRODUODENOSCOPY (EGD) WITH PROPOFOL  N/A 10/14/2021   Procedure: ESOPHAGOGASTRODUODENOSCOPY (EGD) WITH PROPOFOL ;  Surgeon: Cindie Carlin POUR, DO;  Location: [facility];  Service: Endoscopy;  Laterality: N/A;   FEMORAL ARTERY STENT  right leg   INJECTION OF SILICONE OIL Left 09/06/2022   Procedure: INJECTION OF SILICONE OIL;  Surgeon: Tobie Baptist, MD;  Location: [facility] OR;  Service: Ophthalmology;  Laterality: Left;   INJECTION OF SILICONE OIL Right 11/22/2022   Procedure: INJECTION OF SILICONE OIL;  Surgeon: Tobie Baptist, MD;  Location: [facility] OR;  Service: Ophthalmology;  Laterality: Right;   MEMBRANE PEEL Left 09/06/2022   Procedure: MEMBRANECTOMY;  Surgeon: Tobie Baptist, MD;  Location: [facility] OR;  Service: Ophthalmology;  Laterality: Left;   PARS PLANA VITRECTOMY Left 09/06/2022   Procedure: PARS PLANA VITRECTOMY WITH 25 GAUGE;  Surgeon: Tobie Baptist, MD;  Location: [facility] OR;  Service: Ophthalmology;  Laterality: Left;   PARS PLANA VITRECTOMY Left 02/14/2023   Procedure: PARS PLANA VITRECTOMY WITH 25 GAUGE;  Surgeon: Tobie Baptist, MD;  Location: [facility] OR;  Service: Ophthalmology;  Laterality: Left;   PARS PLANA VITRECTOMY Right 12/19/2023   Procedure: SECONDARY IMPLANT OF INTRAOCULAR LENS;  Surgeon:  Tobie Baptist, MD;  Location: [facility] OR;  Service: Ophthalmology;  Laterality: Right;   PARS PLANA VITRECTOMY W/ ENDOLASER PANRETINAL PHOTOCOAGULATION Right 09/12/2023   Procedure: PARS PLANA VITRECTOMY WITH 25 GAUGE WITH ENDOLASER PANRETINAL PHOTOCOAGULATION;  Surgeon: Tobie Baptist, MD;  Location: [facility] OR;  Service: Ophthalmology;  Laterality: Right;  MAC WITH BLOCK   PHOTOCOAGULATION WITH LASER Left 09/06/2022   Procedure: PHOTOCOAGULATION WITH LASER;  Surgeon: Tobie Baptist, MD;  Location: [facility] OR;   Service: Ophthalmology;  Laterality: Left;   PHOTOCOAGULATION WITH LASER Right 11/22/2022   Procedure: PHOTOCOAGULATION WITH LASER;  Surgeon: Tobie Baptist, MD;  Location: [facility] OR;  Service: Ophthalmology;  Laterality: Right;   PHOTOCOAGULATION WITH LASER Left 02/14/2023   Procedure: PHOTOCOAGULATION WITH LASER;  Surgeon: Tobie Baptist, MD;  Location: [facility] OR;  Service: Ophthalmology;  Laterality: Left;   POSTERIOR CERVICAL FUSION/FORAMINOTOMY N/A 07/19/2022   Procedure: Posterior cervical fusion with lateral mass fixation - Cervical four-cervical five, Cervical five-Cervical six - Cervical six-Cervical seven - Cervical seven-Thoracic one with laminectomy;  Surgeon: Louis Shove, MD;  Location: [facility] OR;  Service: Neurosurgery;  Laterality: N/A;   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   Procedure: REPAIR OF COMPLEX TRACTION RETINAL DETACHMENT;  Surgeon: Tobie Baptist, MD;  Location: [facility] OR;  Service: Ophthalmology;  Laterality: Left;  MAC WITH BLOCK   REPAIR OF COMPLEX TRACTION RETINAL DETACHMENT Right 11/22/2022   Procedure: REPAIR OF COMPLEX TRACTION RETINAL DETACHMENT;  Surgeon: Tobie Baptist, MD;  Location: [facility] OR;  Service: Ophthalmology;  Laterality: Right;  MAC WITH BLOCK   SILICON OIL REMOVAL Left 02/14/2023   Procedure: SILICON OIL REMOVAL;  Surgeon: Tobie Baptist, MD;  Location: [facility] OR;  Service: Ophthalmology;  Laterality: Left;   TUBAL LIGATION     VITRECTOMY AND CATARACT Right 10/24/2023   Procedure: EXTRACTION, CATARACT, WITH VITRECTOMY;  Surgeon: Tobie Baptist, MD;  Location: [facility] OR;  Service: Ophthalmology;  Laterality: Right;  BLOCK   Patient Active Problem List   Diagnosis Date Noted   Mixed hyperlipidemia 03/07/2024   Incomplete quadriplegia at C5-6 level (HCC) 01/26/2024   Knee buckling, left 01/26/2024   Recurrent major depressive disorder, in partial remission 01/26/2024   Spasticity 08/25/2023   Post-operative pain 08/15/2022   Orthostatic hypotension  08/15/2022   Impaired gait and mobility 08/15/2022   Mild cognitive impairment 08/01/2022   Cervical myelopathy (HCC) 07/22/2022   Stenosis of cervical spine with myelopathy (HCC) 07/19/2022   Degeneration of cervical intervertebral disc 08/08/2021   Syncope and collapse 08/06/2021   Hypomagnesemia 08/06/2021   Hypothyroidism 08/06/2021   Allergic rhinitis 08/06/2021   AKI (acute kidney injury) 08/06/2021   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/27/2018   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   Gastritis 10/01/2015   Gastroparesis due to DM (HCC) 10/01/2015   Personal history of noncompliance with medical treatment, presenting hazards to health 09/14/2015   Hypokalemia 11/13/2012   Constipation 11/09/2012   Facial cellulitis 11/07/2012   Leukocytosis 11/07/2012   Type 2 diabetes mellitus (HCC) 11/07/2012   Asthma 11/07/2012   PVD (peripheral vascular disease) (HCC) 11/07/2012   Hyponatremia 11/07/2012   Tachycardia 11/07/2012   Nausea & vomiting 11/07/2012   Essential hypertension, benign     PCP: Bevely Doffing, FNP   REFERRING PROVIDER: Cornelio Bouchard, MD  REFERRING DIAG: G95.9 (ICD-10-CM) - Cervical myelopathy (HCC)  THERAPY DIAG:  Other lack of coordination - Plan: PT plan of care cert/re-cert  Other symptoms and signs involving  the nervous system - Plan: PT plan of care cert/re-cert  Cervical myelopathy (HCC) - Plan: PT plan of care cert/re-cert  Other abnormalities of gait and mobility - Plan: PT plan of care cert/re-cert  Rationale for Evaluation and Treatment: Rehabilitation  ONSET DATE: Last year in may  SUBJECTIVE:                                                                                                                                                                                                         SUBJECTIVE STATEMENT: Eval: Pt states she had some degenerating disc in the  neck as well as bilateral eye surgeries. Guesses it is from diabetes and prolonged HTN. Pt states they were supposed to wait to this spring to see if the decompression surgery will have helped or not. Pt states she lives with her boyfriend and daughter cares for her while boyfriend is at work, always has somebody there. Pt states she is struggling with functional mobility and wants to get back to walking with some independence.  Hand dominance: Right  PERTINENT HISTORY:  One neck surgery Diabetes  Blood pressure issues due to the neck surgeries.   PAIN:  Are you having pain? No  PRECAUTIONS: None  RED FLAGS: None     WEIGHT BEARING RESTRICTIONS: No  FALLS:  Has patient fallen in last 6 months? No  LIVING ENVIRONMENT: Lives with: lives with an adult companion Lives in: House/apartment Stairs: No Has following equipment at home: Environmental Consultant - 2 wheeled, Wheelchair (manual), and Ramped entry  OCCUPATION: not working  PLOF: Requires assistive device for independence, Needs assistance with ADLs, Needs assistance with homemaking, Needs assistance with gait, and Needs assistance with transfers  PATIENT GOALS: to get back walking better and with more independence, improved functional mobility, more use of UE  NEXT MD VISIT: February 16th at 10am  OBJECTIVE:  Note: Objective measures were completed at Evaluation unless otherwise noted.  DIAGNOSTIC FINDINGS:  CLINICAL DATA:  Elective surgery.   EXAM: CERVICAL SPINE - 2-3 VIEW   COMPARISON:  None Available.   FINDINGS: Three fluoroscopic spot views of the cervical spine obtained in the operating room in frontal and lateral projections. Rod and pedicle screw fixation from C3 through C7. Fluoroscopy time 21 seconds. Dose 3.02 mGy.   IMPRESSION: Intraoperative fluoroscopy during cervical fusion.  COGNITION: Overall cognitive status: short term memory loss  SENSATION: Light touch: Impaired , pt states she can feel the right  hand more than left hand. Both legs feel numb a little bit.   POSTURE: rounded shoulders,  forward head, increased thoracic kyphosis, and flexed trunk     UPPER EXTREMITY MMT:  MMT Right eval Left eval  Shoulder flexion    Shoulder extension    Shoulder abduction    Shoulder adduction    Shoulder extension    Shoulder internal rotation    Shoulder external rotation    Middle trapezius    Lower trapezius    Elbow flexion    Elbow extension    Wrist flexion    Wrist extension    Wrist ulnar deviation    Wrist radial deviation    Wrist pronation    Wrist supination    Grip strength     (Blank rows = not tested)    COORDINATION: Finger to nose R side more impaired that L, UE rapid alternating movements R more impaired Heel to shin, decreased mobility, LLE aberrant movements      LOWER EXTREMITY MMT:    MMT Right Eval Left Eval  Hip flexion 3+ 4  Hip extension 3 4-  Hip abduction 3 3  Hip adduction 3 3  Hip internal rotation    Hip external rotation    Knee flexion 3 4-  Knee extension 3- 4-  Ankle dorsiflexion 3 3+  Ankle plantarflexion    Ankle inversion    Ankle eversion    (Blank rows = not tested)  BED MOBILITY:  Findings: Sit to supine Modified independence Supine to sit Modified independence, increased time required  TRANSFERS: Sit to stand: SBA  Assistive device utilized: Environmental Consultant - 2 wheeled     Stand to sit: CGA  Assistive device utilized: Environmental Consultant - 2 wheeled      GAIT: Findings: Gait Characteristics: step to pattern, decreased stride length, ataxic, trunk flexed, and wide BOS, Distance walked: about 10 feet combined, Assistive device utilized:Walker - 2 wheeled, Level of assistance: CGA and Min A, and Comments: pt demonstrates difficulty with walking, heavily dependent on UE support and decreased coordination of LEs  FUNCTIONAL TESTS:  30 seconds chair stand test; TBA TUG: TBA  PATIENT SURVEYS: ABC Scale: TBA  TREATMENT DATE:  04/04/2024    Evaluation: -ROM measured, Strength assessed, HEP prescribed, pt educated on prognosis, findings, and importance of HEP compliance if given.                                                                                                                                      PATIENT EDUCATION:  Education details: Pt was educated on findings of PT evaluation, prognosis, frequency of therapy visits and rationale, attendance policy, and HEP if given.   Person educated: Patient and Child(ren) Education method: Explanation, Verbal cues, and Handouts Education comprehension: verbalized understanding, verbal cues required, and needs further education  HOME EXERCISE PROGRAM: Access Code: [email or web address] URL: [email or web address] Date: 04/05/2024 Prepared by: Lang Ada  Exercises - Supine Bridge  - 1 x daily - 7 x weekly - 3 sets -  10 reps - 3 hold - Supine Active Straight Leg Raise  - 1 x daily - 7 x weekly - 3 sets - 10 reps - Heel Raises with Counter Support  - 1 x daily - 7 x weekly - 3 sets - 10 reps - Seated Long Arc Quad  - 1 x daily - 7 x weekly - 3 sets - 10 reps - Seated Ankle Pumps  - 1 x daily - 7 x weekly - 3 sets - 10 reps  ASSESSMENT:  CLINICAL IMPRESSION: Eval: Patient is a 59 y.o. female who was seen today for physical therapy evaluation and treatment for G95.9 (ICD-10-CM) - Cervical myelopathy (HCC).   Patient demonstrates signs and symptoms of cervical myelopathy with decreased LE strength, abnormal coordination in both UE and LEs, and impaired functional mobility. Patient also demonstrates difficulty with ambulation during today's session with labored movement, decreased stride length and velocity noted. Patients symptoms likely arising from damage of compression of spinal cord as well as general deconditioning as pt has been sedentary since the first neck surgery. Patient also demonstrates need for CGA/ABA assist for bed mobility, functional transfers and  ambulation. Patient requires education on PT recommendations, POC, and importance of consistent mobility. Patient would benefit from skilled physical therapy for increased independence with ambulation, increased LE strength, and balance for improved functional mobility, return to higher level of function with ADLs, and progress towards therapy goals.    OBJECTIVE IMPAIRMENTS: Abnormal gait, decreased activity tolerance, decreased balance, decreased cognition, decreased coordination, decreased endurance, decreased knowledge of condition, decreased knowledge of use of DME, decreased mobility, difficulty walking, decreased ROM, decreased strength, hypomobility, postural dysfunction, and pain.   ACTIVITY LIMITATIONS: carrying, lifting, bending, sitting, standing, squatting, stairs, transfers, bed mobility, continence, bathing, dressing, and reach over head  PARTICIPATION LIMITATIONS: meal prep, cleaning, laundry, driving, shopping, community activity, and yard work  PERSONAL FACTORS: Age, Fitness, Past/current experiences, Time since onset of injury/illness/exacerbation, and 1 comorbidity: hx of cervical myelopathy are also affecting patient's functional outcome.   REHAB POTENTIAL: Fair chronic in nature  CLINICAL DECISION MAKING: Stable/uncomplicated  EVALUATION COMPLEXITY: Low   GOALS: Goals reviewed with patient? No  SHORT TERM GOALS: Target date: 04/26/24  Pt will be independent with HEP in order to demonstrate participation in Physical Therapy POC.  Baseline: Goal status: INITIAL  2.  Pt will improve ABC score by at least 15% in order to demonstrate improved pain with functional goals and outcomes. Baseline:  Goal status: INITIAL  LONG TERM GOALS: Target date: 04/19/24  Pt will increase bilateral LE strength scores to at least 4/5 in order to demonstrate improved strength for functional safety and balance skills in ADL/mobility.   Baseline: see objective.  Goal status:  INITIAL  2.  Pt will improve 30 Second Chair Stand Test by at least 2 reps in order to demonstrate improved functional mobility capacity in community setting.  Baseline: see objective.  Goal status: INITIAL  3.  Pt will improve ABC score by at least 30 % in order to demonstrate improved pain with functional goals and outcomes. Baseline: see objective.  Goal status: INITIAL  4.  Pt will demonstrate ability to complete the TUG test with modified independence with no LOB in less than a minute in order to demonstrate improved capacity, safety, and overall quality of movement to optimize function.  Baseline: see objective.  Goal status: INITIAL    PLAN:  PT FREQUENCY: 1-2x/week  PT DURATION: 6 weeks  PLANNED INTERVENTIONS: 97110-Therapeutic exercises,  02469- Therapeutic activity, [id number]- Neuromuscular re-education, [phone]- Self Care, [phone]- Manual therapy, [phone]- Gait training, Patient/Family education, Balance training, Stair training, Joint mobilization, Joint manipulation, Spinal manipulation, Spinal mobilization, Vestibular training, DME instructions, Cryotherapy, and Moist heat  PLAN FOR NEXT SESSION: ABC scale, UE strength assessment, LE strengthening, progress the functional mobility   Lang Ada, PT, DPT [facility] Office: [phone] 2:47 PM, 04/05/24    Managed Medicaid Authorization Request Treatment Start Date: 04-17-2024  Visit Dx Codes: R27.8; R29.818; G95.9; R26.89  Functional Tool Score: Mod assist for chair to bed transfer  For all possible CPT codes, reference the Planned Interventions line above.     Check all conditions that are expected to impact treatment: {Conditions expected to impact treatment:Structural or anatomic abnormalities and Complications related to surgery   If treatment provided at initial evaluation, no treatment charged due to lack of authorization.        "

## 2024-04-05 ENCOUNTER — Ambulatory Visit (HOSPITAL_COMMUNITY): Attending: Physical Medicine and Rehabilitation

## 2024-04-05 ENCOUNTER — Encounter (HOSPITAL_COMMUNITY): Payer: Self-pay

## 2024-04-05 ENCOUNTER — Other Ambulatory Visit: Payer: Self-pay

## 2024-04-05 DIAGNOSIS — G959 Disease of spinal cord, unspecified: Secondary | ICD-10-CM | POA: Diagnosis present

## 2024-04-05 DIAGNOSIS — R2689 Other abnormalities of gait and mobility: Secondary | ICD-10-CM | POA: Diagnosis present

## 2024-04-05 DIAGNOSIS — R29818 Other symptoms and signs involving the nervous system: Secondary | ICD-10-CM | POA: Diagnosis present

## 2024-04-05 DIAGNOSIS — R278 Other lack of coordination: Secondary | ICD-10-CM | POA: Insufficient documentation

## 2024-04-08 ENCOUNTER — Ambulatory Visit: Admitting: Nurse Practitioner

## 2024-04-10 ENCOUNTER — Other Ambulatory Visit: Payer: Self-pay | Admitting: *Deleted

## 2024-04-10 MED ORDER — MIDODRINE HCL 10 MG PO TABS: 10.0000 mg | ORAL_TABLET | Freq: Three times a day (TID) | ORAL | 1 refills | Status: AC

## 2024-04-18 ENCOUNTER — Ambulatory Visit (HOSPITAL_COMMUNITY)

## 2024-04-25 ENCOUNTER — Ambulatory Visit (HOSPITAL_COMMUNITY): Attending: Physical Medicine and Rehabilitation

## 2024-04-29 ENCOUNTER — Encounter: Admitting: Physical Medicine and Rehabilitation

## 2024-04-30 ENCOUNTER — Ambulatory Visit (HOSPITAL_COMMUNITY): Admitting: Physical Therapy

## 2024-05-02 ENCOUNTER — Ambulatory Visit (HOSPITAL_COMMUNITY)

## 2024-05-07 ENCOUNTER — Ambulatory Visit (HOSPITAL_COMMUNITY)

## 2024-05-09 ENCOUNTER — Ambulatory Visit (HOSPITAL_COMMUNITY)

## 2024-05-14 ENCOUNTER — Ambulatory Visit (HOSPITAL_COMMUNITY)

## 2024-05-21 ENCOUNTER — Ambulatory Visit (HOSPITAL_COMMUNITY)

## 2024-05-28 ENCOUNTER — Ambulatory Visit (HOSPITAL_COMMUNITY)

## 2024-06-14 ENCOUNTER — Ambulatory Visit: Admitting: Nurse Practitioner

## 2024-09-03 ENCOUNTER — Ambulatory Visit: Payer: Self-pay
